# Patient Record
Sex: Female | Born: 1975 | Race: White | Hispanic: No | Smoking: Former smoker
Health system: Southern US, Community
[De-identification: ages and names within clinical notes are randomized; demographics above are authoritative.]

## PROBLEM LIST (undated history)

## (undated) DIAGNOSIS — J45909 Unspecified asthma, uncomplicated: Secondary | ICD-10-CM

## (undated) DIAGNOSIS — Z9289 Personal history of other medical treatment: Secondary | ICD-10-CM

## (undated) DIAGNOSIS — K5720 Diverticulitis of large intestine with perforation and abscess without bleeding: Secondary | ICD-10-CM

## (undated) DIAGNOSIS — F419 Anxiety disorder, unspecified: Secondary | ICD-10-CM

## (undated) DIAGNOSIS — D249 Benign neoplasm of unspecified breast: Secondary | ICD-10-CM

## (undated) DIAGNOSIS — R87810 Cervical high risk human papillomavirus (HPV) DNA test positive: Secondary | ICD-10-CM

## (undated) DIAGNOSIS — K5790 Diverticulosis of intestine, part unspecified, without perforation or abscess without bleeding: Secondary | ICD-10-CM

## (undated) DIAGNOSIS — F329 Major depressive disorder, single episode, unspecified: Secondary | ICD-10-CM

## (undated) DIAGNOSIS — G709 Myoneural disorder, unspecified: Secondary | ICD-10-CM

## (undated) DIAGNOSIS — F32A Depression, unspecified: Secondary | ICD-10-CM

## (undated) DIAGNOSIS — N83209 Unspecified ovarian cyst, unspecified side: Secondary | ICD-10-CM

## (undated) DIAGNOSIS — K819 Cholecystitis, unspecified: Secondary | ICD-10-CM

## (undated) DIAGNOSIS — K219 Gastro-esophageal reflux disease without esophagitis: Secondary | ICD-10-CM

## (undated) DIAGNOSIS — I10 Essential (primary) hypertension: Secondary | ICD-10-CM

## (undated) DIAGNOSIS — N920 Excessive and frequent menstruation with regular cycle: Secondary | ICD-10-CM

## (undated) DIAGNOSIS — K5792 Diverticulitis of intestine, part unspecified, without perforation or abscess without bleeding: Secondary | ICD-10-CM

## (undated) DIAGNOSIS — G4730 Sleep apnea, unspecified: Secondary | ICD-10-CM

## (undated) DIAGNOSIS — G35 Multiple sclerosis: Secondary | ICD-10-CM

## (undated) DIAGNOSIS — G35D Multiple sclerosis, unspecified: Secondary | ICD-10-CM

## (undated) HISTORY — DX: Anxiety disorder, unspecified: F41.9

## (undated) HISTORY — DX: Excessive and frequent menstruation with regular cycle: N92.0

## (undated) HISTORY — DX: Personal history of other medical treatment: Z92.89

## (undated) HISTORY — DX: Cholecystitis, unspecified: K81.9

## (undated) HISTORY — DX: Benign neoplasm of unspecified breast: D24.9

## (undated) HISTORY — DX: Diverticulosis of intestine, part unspecified, without perforation or abscess without bleeding: K57.90

## (undated) HISTORY — DX: Sleep apnea, unspecified: G47.30

## (undated) HISTORY — DX: Unspecified ovarian cyst, unspecified side: N83.209

## (undated) HISTORY — DX: Gastro-esophageal reflux disease without esophagitis: K21.9

## (undated) HISTORY — DX: Myoneural disorder, unspecified: G70.9

## (undated) HISTORY — DX: Depression, unspecified: F32.A

## (undated) HISTORY — DX: Diverticulitis of intestine, part unspecified, without perforation or abscess without bleeding: K57.92

## (undated) HISTORY — DX: Cervical high risk human papillomavirus (HPV) DNA test positive: R87.810

## (undated) HISTORY — PX: EYE SURGERY: SHX253

---
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
## 2004-03-31 DIAGNOSIS — K819 Cholecystitis, unspecified: Secondary | ICD-10-CM

## 2004-03-31 HISTORY — PX: CHOLECYSTECTOMY: SHX55

## 2004-03-31 HISTORY — DX: Cholecystitis, unspecified: K81.9

## 2004-07-26 ENCOUNTER — Ambulatory Visit: Payer: Self-pay | Admitting: Neurology

## 2004-08-16 ENCOUNTER — Ambulatory Visit: Payer: Self-pay | Admitting: Neurology

## 2005-01-10 ENCOUNTER — Emergency Department: Payer: Self-pay | Admitting: Emergency Medicine

## 2005-03-13 ENCOUNTER — Ambulatory Visit: Payer: Self-pay | Admitting: Surgery

## 2008-08-24 DIAGNOSIS — D239 Other benign neoplasm of skin, unspecified: Secondary | ICD-10-CM

## 2008-08-24 HISTORY — DX: Other benign neoplasm of skin, unspecified: D23.9

## 2008-08-29 ENCOUNTER — Ambulatory Visit: Payer: Self-pay

## 2010-01-30 ENCOUNTER — Ambulatory Visit: Payer: Self-pay

## 2010-01-31 ENCOUNTER — Ambulatory Visit: Payer: Self-pay

## 2010-02-01 DIAGNOSIS — N920 Excessive and frequent menstruation with regular cycle: Secondary | ICD-10-CM

## 2010-02-01 HISTORY — PX: HYSTEROSCOPY: SHX211

## 2010-02-01 HISTORY — DX: Excessive and frequent menstruation with regular cycle: N92.0

## 2010-02-01 HISTORY — PX: DILATION AND CURETTAGE, DIAGNOSTIC / THERAPEUTIC: SUR384

## 2010-02-01 LAB — PATHOLOGY REPORT

## 2012-08-30 DIAGNOSIS — G35 Multiple sclerosis: Secondary | ICD-10-CM | POA: Insufficient documentation

## 2013-12-12 ENCOUNTER — Emergency Department (HOSPITAL_COMMUNITY)
Admission: EM | Admit: 2013-12-12 | Discharge: 2013-12-13 | Disposition: A | Discharge status: Home / Self Care | Payer: 59 | Attending: Emergency Medicine | Admitting: Emergency Medicine

## 2013-12-12 ENCOUNTER — Encounter (HOSPITAL_COMMUNITY): Payer: Self-pay | Admitting: Emergency Medicine

## 2013-12-12 DIAGNOSIS — Z9089 Acquired absence of other organs: Secondary | ICD-10-CM | POA: Insufficient documentation

## 2013-12-12 DIAGNOSIS — Z79899 Other long term (current) drug therapy: Secondary | ICD-10-CM | POA: Diagnosis not present

## 2013-12-12 DIAGNOSIS — N83209 Unspecified ovarian cyst, unspecified side: Secondary | ICD-10-CM | POA: Insufficient documentation

## 2013-12-12 DIAGNOSIS — Z3202 Encounter for pregnancy test, result negative: Secondary | ICD-10-CM | POA: Diagnosis not present

## 2013-12-12 DIAGNOSIS — Z87891 Personal history of nicotine dependence: Secondary | ICD-10-CM | POA: Insufficient documentation

## 2013-12-12 DIAGNOSIS — R1032 Left lower quadrant pain: Secondary | ICD-10-CM | POA: Insufficient documentation

## 2013-12-12 DIAGNOSIS — G35 Multiple sclerosis: Secondary | ICD-10-CM | POA: Diagnosis not present

## 2013-12-12 DIAGNOSIS — N83202 Unspecified ovarian cyst, left side: Secondary | ICD-10-CM

## 2013-12-12 HISTORY — DX: Multiple sclerosis, unspecified: G35.D

## 2013-12-12 HISTORY — DX: Multiple sclerosis: G35

## 2013-12-12 LAB — COMPREHENSIVE METABOLIC PANEL
ALT: 14 U/L (ref 0–35)
ANION GAP: 10 (ref 5–15)
AST: 18 U/L (ref 0–37)
Albumin: 3.6 g/dL (ref 3.5–5.2)
Alkaline Phosphatase: 104 U/L (ref 39–117)
BILIRUBIN TOTAL: 0.5 mg/dL (ref 0.3–1.2)
BUN: 10 mg/dL (ref 6–23)
CO2: 28 mEq/L (ref 19–32)
Calcium: 9.1 mg/dL (ref 8.4–10.5)
Chloride: 100 mEq/L (ref 96–112)
Creatinine, Ser: 0.97 mg/dL (ref 0.50–1.10)
GFR calc Af Amer: 84 mL/min — ABNORMAL LOW (ref >90)
GFR calc non Af Amer: 72 mL/min — ABNORMAL LOW (ref >90)
GLUCOSE: 87 mg/dL (ref 70–99)
Potassium: 3.9 mEq/L (ref 3.7–5.3)
Sodium: 138 mEq/L (ref 137–147)
Total Protein: 7.2 g/dL (ref 6.0–8.3)

## 2013-12-12 LAB — CBC WITH DIFFERENTIAL/PLATELET
Basophils Absolute: 0.1 10*3/uL (ref 0.0–0.1)
Basophils Relative: 1 % (ref 0–1)
Eosinophils Absolute: 0.1 10*3/uL (ref 0.0–0.7)
Eosinophils Relative: 1 % (ref 0–5)
HCT: 40.9 % (ref 36.0–46.0)
Hemoglobin: 13.5 g/dL (ref 12.0–15.0)
Lymphocytes Relative: 17 % (ref 12–46)
Lymphs Abs: 2 10*3/uL (ref 0.7–4.0)
MCH: 28.7 pg (ref 26.0–34.0)
MCHC: 33 g/dL (ref 30.0–36.0)
MCV: 86.8 fL (ref 78.0–100.0)
Monocytes Absolute: 1.1 10*3/uL — ABNORMAL HIGH (ref 0.1–1.0)
Monocytes Relative: 9 % (ref 3–12)
NEUTROS ABS: 8.5 10*3/uL — AB (ref 1.7–7.7)
NEUTROS PCT: 72 % (ref 43–77)
Platelets: 305 10*3/uL (ref 150–400)
RBC: 4.71 MIL/uL (ref 3.87–5.11)
RDW: 14.5 % (ref 11.5–15.5)
WBC: 11.8 10*3/uL — ABNORMAL HIGH (ref 4.0–10.5)

## 2013-12-12 LAB — LIPASE, BLOOD: Lipase: 34 U/L (ref 11–59)

## 2013-12-12 NOTE — ED Notes (Signed)
Pt c/o intermittent left lower abdomina pain since Saturday. Pt denies n/v/d, constipation. Last BM was today and normal. No OTC medications taken. Pt denies dysuria, hematuria. Pt has MS

## 2013-12-12 NOTE — ED Provider Notes (Addendum)
CSN: [id number]     Arrival date & time 12/12/13  2050 History   First MD Initiated Contact with Patient 12/12/13 2347     Chief Complaint  Patient presents with  . Abdominal Pain     (Consider location/radiation/quality/duration/timing/severity/associated sxs/prior Treatment) The history is provided by the patient.  Sandra Bonilla is a 38 y.o. female hx of multiple sclerosis here with LLQ pain. Episode of sharp, LLQ pain 2 days ago, lasting about 1 hr. Today around 9pm, had another episode of sharp, LLQ pain. Denies nausea or vomiting. Denies fever or chills. No urinary symptoms. Had previous cholecystectomy.    Past Medical History  Diagnosis Date  . MS (multiple sclerosis)    Past Surgical History  Procedure Laterality Date  . Cholecystectomy    . Dilation and curettage, diagnostic / therapeutic     History reviewed. No pertinent family history. History  Substance Use Topics  . Smoking status: Former Research scientist (life sciences)  . Smokeless tobacco: Never Used  . Alcohol Use: No   OB History   Grav Para Term Preterm Abortions TAB SAB Ect Mult Living                 Review of Systems  Gastrointestinal: Positive for abdominal pain.  All other systems reviewed and are negative.     Allergies  Septra  Home Medications   Prior to Admission medications   Medication Sig Start Date End Date Taking? Authorizing Provider  busPIRone (BUSPAR) 15 MG tablet Take 15 mg by mouth daily.   Yes Historical Provider, MD  Cholecalciferol (VITAMIN D) 2000 UNITS CAPS Take 2,000 Units by mouth daily.   Yes Historical Provider, MD  lamoTRIgine (LAMICTAL) 200 MG tablet Take 200 mg by mouth daily.   Yes Historical Provider, MD  loratadine (CLARITIN) 10 MG tablet Take 10 mg by mouth daily.   Yes Historical Provider, MD  Polyvinyl Alcohol-Povidone (REFRESH OP) Place 2 drops into both eyes 4 (four) times daily as needed (dryness).   Yes Historical Provider, MD  Teriflunomide (AUBAGIO) 14 MG TABS Take 14 mg  by mouth daily.   Yes Historical Provider, MD   BP 128/71  Pulse 95  Temp(Src) 98 F (36.7 C) (Oral)  Resp 16  Ht 5\' 5"  (1.651 m)  Wt 206 lb (93.441 kg)  BMI 34.28 kg/m2  SpO2 98% Physical Exam  Nursing note and vitals reviewed. Constitutional: She is oriented to person, place, and time. She appears well-developed and well-nourished.  HENT:  Head: Normocephalic.  Mouth/Throat: Oropharynx is clear and moist.  Eyes: Conjunctivae and EOM are normal. Pupils are equal, round, and reactive to light.  Neck: Normal range of motion. Neck supple.  Cardiovascular: Normal rate, regular rhythm and normal heart sounds.   Pulmonary/Chest: Effort normal and breath sounds normal. No respiratory distress. She has no wheezes. She has no rales.  Abdominal: Soft. Bowel sounds are normal.  Mild LLQ tenderness. No rebound. No CVAT   Musculoskeletal: Normal range of motion. She exhibits no edema.  Neurological: She is alert and oriented to person, place, and time. No cranial nerve deficit. Coordination normal.  Skin: Skin is warm and dry.  Psychiatric: She has a normal mood and affect. Her behavior is normal. Judgment and thought content normal.    ED Course  Procedures (including critical care time) Labs Review Labs Reviewed  CBC WITH DIFFERENTIAL - Abnormal; Notable for the following:    WBC 11.8 (*)    Neutro Abs 8.5 (*)  Monocytes Absolute 1.1 (*)    All other components within normal limits  COMPREHENSIVE METABOLIC PANEL - Abnormal; Notable for the following:    GFR calc non Af Amer 72 (*)    GFR calc Af Amer 84 (*)    All other components within normal limits  URINALYSIS, ROUTINE W REFLEX MICROSCOPIC  LIPASE, BLOOD  PREGNANCY, URINE    Imaging Review US Transvaginal Non-ob  12/13/2013   CLINICAL DATA:  LEFT adnexal pain, assess for ovarian mass or torsion.  EXAM: TRANSABDOMINAL AND TRANSVAGINAL ULTRASOUND OF PELVIS  DOPPLER ULTRASOUND OF OVARIES  TECHNIQUE: Both transabdominal and  transvaginal ultrasound examinations of the pelvis were performed. Transabdominal technique was performed for global imaging of the pelvis including uterus, ovaries, adnexal regions, and pelvic cul-de-sac.  It was necessary to proceed with endovaginal exam following the transabdominal exam to visualize the endometrium and adenexa. Color and duplex Doppler ultrasound was utilized to evaluate blood flow to the ovaries.  COMPARISON:  CT of the abdomen and pelvis December 13, 2013  FINDINGS: Uterus  Measurements: 7 x 4.1 x 4.4 cm. No fibroids or other mass visualized.  Endometrium  Thickness: 5 mm. No focal abnormality visualized. Linear echogenic intrauterine device with acoustic shadowing appears well seated central within the endometrial cavity.  Right ovary  Measurements: 4.3 x 3 x 3.9 cm. Normal appearance/no adnexal mass.  Left ovary  Measurements: 7.3 x 4.3 x 5.7 cm. 6.8 x 3.9 x 4.5 cm anechoic cyst with increased through transmission.  Pulsed Doppler evaluation of both ovaries demonstrates normal low-resistance arterial and venous waveforms.  Other findings  Trace free fluid in the pelvis is likely physiologic.  IMPRESSION: 6.8 x 3.9 x 4.5 cm LEFT adnexal cyst without sonographic findings of ovarian torsion. Recommend 1 year follow-up (please note, if mass was greater than 7 cm, further imaging such as MRI would be indicated). This recommendation follows the consensus statement: Management of Asymptomatic Ovarian and Other Adnexal Cysts Imaged at [place]: Society of Radiologists in [place]. Radiology 2010; [phone].  IUD in situ.   Electronically Signed   By: Elon Alas   On: 12/13/2013 03:56   US Pelvis Complete  12/13/2013   CLINICAL DATA:  LEFT adnexal pain, assess for ovarian mass or torsion.  EXAM: TRANSABDOMINAL AND TRANSVAGINAL ULTRASOUND OF PELVIS  DOPPLER ULTRASOUND OF OVARIES  TECHNIQUE: Both transabdominal and transvaginal ultrasound examinations of the  pelvis were performed. Transabdominal technique was performed for global imaging of the pelvis including uterus, ovaries, adnexal regions, and pelvic cul-de-sac.  It was necessary to proceed with endovaginal exam following the transabdominal exam to visualize the endometrium and adenexa. Color and duplex Doppler ultrasound was utilized to evaluate blood flow to the ovaries.  COMPARISON:  CT of the abdomen and pelvis December 13, 2013  FINDINGS: Uterus  Measurements: 7 x 4.1 x 4.4 cm. No fibroids or other mass visualized.  Endometrium  Thickness: 5 mm. No focal abnormality visualized. Linear echogenic intrauterine device with acoustic shadowing appears well seated central within the endometrial cavity.  Right ovary  Measurements: 4.3 x 3 x 3.9 cm. Normal appearance/no adnexal mass.  Left ovary  Measurements: 7.3 x 4.3 x 5.7 cm. 6.8 x 3.9 x 4.5 cm anechoic cyst with increased through transmission.  Pulsed Doppler evaluation of both ovaries demonstrates normal low-resistance arterial and venous waveforms.  Other findings  Trace free fluid in the pelvis is likely physiologic.  IMPRESSION: 6.8 x 3.9 x 4.5 cm LEFT adnexal cyst without sonographic  findings of ovarian torsion. Recommend 1 year follow-up (please note, if mass was greater than 7 cm, further imaging such as MRI would be indicated). This recommendation follows the consensus statement: Management of Asymptomatic Ovarian and Other Adnexal Cysts Imaged at [place]: Society of Radiologists in [place]. Radiology 2010; [phone].  IUD in situ.   Electronically Signed   By: Elon Alas   On: 12/13/2013 03:56   [place] Art/ven Flow Abd Pelv Doppler  12/13/2013   CLINICAL DATA:  LEFT adnexal pain, assess for ovarian mass or torsion.  EXAM: TRANSABDOMINAL AND TRANSVAGINAL ULTRASOUND OF PELVIS  DOPPLER ULTRASOUND OF OVARIES  TECHNIQUE: Both transabdominal and transvaginal ultrasound examinations of the pelvis were performed.  Transabdominal technique was performed for global imaging of the pelvis including uterus, ovaries, adnexal regions, and pelvic cul-de-sac.  It was necessary to proceed with endovaginal exam following the transabdominal exam to visualize the endometrium and adenexa. Color and duplex Doppler ultrasound was utilized to evaluate blood flow to the ovaries.  COMPARISON:  CT of the abdomen and pelvis December 13, 2013  FINDINGS: Uterus  Measurements: 7 x 4.1 x 4.4 cm. No fibroids or other mass visualized.  Endometrium  Thickness: 5 mm. No focal abnormality visualized. Linear echogenic intrauterine device with acoustic shadowing appears well seated central within the endometrial cavity.  Right ovary  Measurements: 4.3 x 3 x 3.9 cm. Normal appearance/no adnexal mass.  Left ovary  Measurements: 7.3 x 4.3 x 5.7 cm. 6.8 x 3.9 x 4.5 cm anechoic cyst with increased through transmission.  Pulsed Doppler evaluation of both ovaries demonstrates normal low-resistance arterial and venous waveforms.  Other findings  Trace free fluid in the pelvis is likely physiologic.  IMPRESSION: 6.8 x 3.9 x 4.5 cm LEFT adnexal cyst without sonographic findings of ovarian torsion. Recommend 1 year follow-up (please note, if mass was greater than 7 cm, further imaging such as MRI would be indicated). This recommendation follows the consensus statement: Management of Asymptomatic Ovarian and Other Adnexal Cysts Imaged at [place]: Society of Radiologists in [place]. Radiology 2010; [phone].  IUD in situ.   Electronically Signed   By: Elon Alas   On: 12/13/2013 03:56   Ct Renal Stone Study  12/13/2013   CLINICAL DATA:  Left lower quadrant abdominal pain.  EXAM: CT ABDOMEN AND PELVIS WITHOUT CONTRAST  TECHNIQUE: Multidetector CT imaging of the abdomen and pelvis was performed following the standard protocol without IV contrast.  COMPARISON:  None.  FINDINGS: The visualized lung bases are clear.  The liver and  spleen are unremarkable in appearance. The patient is status post cholecystectomy, with clips noted along the gallbladder fossa. The pancreas and right adrenal gland are unremarkable. A 1.9 cm left adrenal lesion demonstrates attenuation compatible with an adrenal adenoma.  A tiny 2 mm nonobstructing stone is noted in the interpole region of the left kidney. The kidneys are otherwise unremarkable. There is no evidence of hydronephrosis. No obstructing ureteral stones are seen. No perinephric stranding is appreciated.  No free fluid is identified. The small bowel is unremarkable in appearance. The stomach is within normal limits. No acute vascular abnormalities are seen.  The appendix is normal in caliber, without evidence for appendicitis. Scattered diverticulosis is noted along the proximal sigmoid colon, without evidence of diverticulitis.  The bladder is mildly distended and grossly unremarkable. The uterus is unremarkable in appearance. The intrauterine device is noted in expected position at the fundus of the uterus. A prominent 6.6 cm  cystic lesion is noted at the left adnexa. The right ovary is unremarkable in appearance. No inguinal lymphadenopathy is seen.  No acute osseous abnormalities are identified.  IMPRESSION: 1. No acute abnormality seen to explain the patient's symptoms. 2. 6.6 cm large left adnexal cystic lesion. Pelvic ultrasound is recommended for further evaluation, when and as deemed clinically appropriate. 3. Tiny 2 mm nonobstructing left renal stone seen. 4. 1.9 cm left adrenal adenoma incidentally noted. 5. Scattered diverticulosis along the proximal sigmoid colon, without evidence of diverticulitis.   Electronically Signed   By: Garald Balding M.D.   On: 12/13/2013 02:12     EKG Interpretation None      MDM   Final diagnoses:  None    Sandra Bonilla is a 38 y.o. female here with LLQ pain. Likely renal colic, also consider diverticulitis. Less likely ovarian in origin. Will  get CT ab/pel to further assess.   4:21 AM CT showed L ovarian cyst. Also 2 mm nonobstructing stone, which is likely not the source of her pain. US showed no torsion. Patient didn't require pain meds. Has GYN f/u. Will d/c home with prn pain meds.    Wandra Arthurs, MD 12/13/13 Corine Shelter  Wandra Arthurs, MD 12/13/13 [phone]

## 2013-12-13 ENCOUNTER — Emergency Department (HOSPITAL_COMMUNITY): Payer: 59

## 2013-12-13 ENCOUNTER — Encounter (HOSPITAL_COMMUNITY): Payer: Self-pay

## 2013-12-13 LAB — URINALYSIS, ROUTINE W REFLEX MICROSCOPIC
BILIRUBIN URINE: NEGATIVE
Glucose, UA: NEGATIVE mg/dL
Hgb urine dipstick: NEGATIVE
KETONES UR: NEGATIVE mg/dL
Leukocytes, UA: NEGATIVE
NITRITE: NEGATIVE
Protein, ur: NEGATIVE mg/dL
Specific Gravity, Urine: 1.02 (ref 1.005–1.030)
Urobilinogen, UA: 0.2 mg/dL (ref 0.0–1.0)
pH: 6 (ref 5.0–8.0)

## 2013-12-13 LAB — PREGNANCY, URINE: Preg Test, Ur: NEGATIVE

## 2013-12-13 MED ORDER — HYDROCODONE-ACETAMINOPHEN 5-325 MG PO TABS
1.0000 | ORAL_TABLET | Freq: Four times a day (QID) | ORAL | Status: DC | PRN
Start: 2013-12-13 — End: 2016-04-25

## 2013-12-13 NOTE — Discharge Instructions (Signed)
Take motrin for mild pain.   Take vicodin for severe pain. Do NOT drive with it.   You need to see your GYN doctor for follow up.   Return to ER if you have severe pain, vomiting, fevers.

## 2013-12-13 NOTE — ED Notes (Signed)
Patient transported to CT 

## 2013-12-13 NOTE — ED Notes (Signed)
Pt reports LUQ abdominal pain x 3 days. States pain waxes and wanes and increases with movement. Rates pain 1/10 at this time but has been 10/10 earlier today. Denies dysuria, N/V/D. Denies unprotected sex, pregnancy or menses at this time.

## 2014-03-31 DIAGNOSIS — D249 Benign neoplasm of unspecified breast: Secondary | ICD-10-CM

## 2014-03-31 HISTORY — DX: Benign neoplasm of unspecified breast: D24.9

## 2014-03-31 HISTORY — PX: BREAST BIOPSY: SHX20

## 2014-11-22 ENCOUNTER — Other Ambulatory Visit: Payer: Self-pay | Admitting: Unknown Physician Specialty

## 2014-11-22 DIAGNOSIS — R928 Other abnormal and inconclusive findings on diagnostic imaging of breast: Secondary | ICD-10-CM

## 2014-11-28 ENCOUNTER — Ambulatory Visit
Admission: RE | Admit: 2014-11-28 | Discharge: 2014-11-28 | Disposition: A | Discharge status: Home / Self Care | Payer: 59 | Source: Ambulatory Visit | Attending: Unknown Physician Specialty | Admitting: Unknown Physician Specialty

## 2014-11-28 DIAGNOSIS — R928 Other abnormal and inconclusive findings on diagnostic imaging of breast: Secondary | ICD-10-CM | POA: Insufficient documentation

## 2014-11-28 DIAGNOSIS — N63 Unspecified lump in breast: Secondary | ICD-10-CM | POA: Diagnosis not present

## 2014-11-30 DIAGNOSIS — Z9289 Personal history of other medical treatment: Secondary | ICD-10-CM

## 2014-11-30 HISTORY — DX: Personal history of other medical treatment: Z92.89

## 2014-12-05 ENCOUNTER — Other Ambulatory Visit: Payer: Self-pay | Admitting: Unknown Physician Specialty

## 2014-12-05 DIAGNOSIS — N63 Unspecified lump in unspecified breast: Secondary | ICD-10-CM

## 2014-12-05 DIAGNOSIS — R928 Other abnormal and inconclusive findings on diagnostic imaging of breast: Secondary | ICD-10-CM

## 2014-12-08 ENCOUNTER — Ambulatory Visit
Admission: RE | Admit: 2014-12-08 | Discharge: 2014-12-08 | Disposition: A | Discharge status: Home / Self Care | Payer: 59 | Source: Ambulatory Visit | Attending: Unknown Physician Specialty | Admitting: Unknown Physician Specialty

## 2014-12-08 DIAGNOSIS — N63 Unspecified lump in unspecified breast: Secondary | ICD-10-CM

## 2014-12-08 DIAGNOSIS — R928 Other abnormal and inconclusive findings on diagnostic imaging of breast: Secondary | ICD-10-CM

## 2014-12-08 LAB — HM MAMMOGRAPHY

## 2014-12-11 LAB — SURGICAL PATHOLOGY

## 2014-12-13 ENCOUNTER — Ambulatory Visit (INDEPENDENT_AMBULATORY_CARE_PROVIDER_SITE_OTHER): Payer: 59 | Admitting: Neurology

## 2014-12-13 ENCOUNTER — Encounter: Payer: Self-pay | Admitting: Neurology

## 2014-12-13 VITALS — BP 132/90 | HR 70 | Resp 12 | Ht 66.0 in | Wt 201.0 lb

## 2014-12-13 DIAGNOSIS — G4700 Insomnia, unspecified: Secondary | ICD-10-CM

## 2014-12-13 DIAGNOSIS — G35 Multiple sclerosis: Secondary | ICD-10-CM | POA: Diagnosis not present

## 2014-12-13 DIAGNOSIS — R269 Unspecified abnormalities of gait and mobility: Secondary | ICD-10-CM

## 2014-12-13 MED ORDER — DOXEPIN HCL 10 MG PO CAPS: 10.0000 mg | ORAL_CAPSULE | Freq: Every day | ORAL | Status: DC

## 2014-12-13 NOTE — Progress Notes (Signed)
[facility]  PATIENT: Sandra Bonilla DOB: 01/02/1976  REFERRING DOCTOR OR PCP:   Dorisann Frames SOURCE: patient and medical records  _________________________________   HISTORICAL  CHIEF COMPLAINT:  Chief Complaint  Patient presents with  . Multiple Sclerosis    Sts. dx. with MS about 10 yrs. ago.  Presenting sx. was numbness right foot.  She was living in [place], [place] at the time.  She is unable to recall the name of the first neruologist she saw, but believes it was here at [facility].  Sts. dx. confirmed with MRI.  No LP done.  She then saw a 2nd neurologist for a 2nd opinion but can't recall his name.  Sts. he agreed with dx. of MS.  She did not start any MS therapy--about a yr. later she saw a neurologist at [facility] was started on   . Fatigue    Copaxone.  She stopped Copaxone after a yr. due to inj. site rxn's. She then started Aubagio--has been on this for about 2 yrs. now.  Sts. she tolerates Aubagio well.  Sts. last mri was done at [facility] at the beginning of the year and showed no new lesions.  Sts. Dr. Deloria Lair moved to [place] state, so she is here to establish care with Dr. Felecia Shelling.  Sts. currently the sx. that bothers her the most is difficulty staying asleep.  Sts. she goes to sleep ok but wakes after a couple of hrs.  She is   . Insomnia    currently  not taking any rx. or otc sleep aid.  Sts. she has tried Ambien in the past and it helped.  Sts. she does snore.  She does c/o daytime fatigue.  Denies h/a's. /f    HISTORY OF PRESENT ILLNESS:  I had the pleasure of seeing your patient, Sandra Bonilla, at [facility] for neurologic consultation regarding her multiple sclerosis.    About 10 years ago, she had gait changes and felt she could not tell when the rigth foot hit the ground.   She had an MRI consistent with MS and was referred to Dr. Jacolyn Reedy who diagnosed her with MS.   She then saw Dr. Deloria Lair  at [facility]  She was reluctant to take a medication until 3-4 years ago when she started Copaxone.   She had skin reactions, she stopped after one year and switched to Aubagio.   She has tolerated it well.    Her last MRI was performed at [facility] earlier in 2016.   She was told it was unchanged.     Gait/strength/sensation:  She felt a little more off balanced last year.  She feels this improved but she still feels she veers to the right side.   She denies any current numbness or dysesthesia but had a couple episodes of numbness in the past.    .    Her left grip is weaker than her right.  Bladder/Bowel:   She denies any bladder or bowel difficulties.   She has nocturia x 1 some nights.    Fatigue/sleep: She reports some fatigue that is physical as well as cognitive. She does not note much sleepiness. She has had difficulty with her sleep and feels she often will get only 3 hours of sleep during the night. On a typical night, she will fall asleep fairly easily. However, she will wake up several times and have difficulty falling asleep each time. A lot of thoughts go through her mind  when she is awake and she feels she has trouble quieting her mind. She snores. She has not been noted to have pauses in her breathing or gasping for breath at night.  Mood/cognition: She denies depression but has had some anxiety. She is currently on BuSpar 15 mg po tid and lamotrigine 200 mg po bid.  She has not noted major problems with cognition. However, she has noted some difficulty with parallel processing. She works as a Librarian, academic and notes this difficulty more when she is busier. She does not have any major problem with memory or word finding.  REVIEW OF SYSTEMS: Constitutional: No fevers, chills, sweats, or change in appetite   She notes fatigue and poor sleep.    Eyes: No visual changes, double vision, eye pain Ear, nose and throat: No hearing loss, ear pain, nasal congestion, sore throat Cardiovascular: No chest  pain, palpitations Respiratory: No shortness of breath at rest or with exertion.   No wheezes GastrointestinaI: No nausea, vomiting, diarrhea, abdominal pain, fecal incontinence Genitourinary: No dysuria, urinary retention or frequency.  No nocturia. Musculoskeletal: No neck pain, back pain Integumentary: No rash, pruritus, skin lesions Neurological: as above Psychiatric: No depression at this time. Some anxiety Endocrine: No palpitations, diaphoresis, change in appetite, change in weigh or increased thirst Hematologic/Lymphatic: No anemia, purpura, petechiae. Allergic/Immunologic: No itchy/runny eyes, nasal congestion, recent allergic reactions, rashes  ALLERGIES: Allergies  Allergen Reactions  . Septra [Sulfamethoxazole-Trimethoprim] Rash    HOME MEDICATIONS:  Current outpatient prescriptions:  .  busPIRone (BUSPAR) 15 MG tablet, Take 15 mg by mouth daily., Disp: , Rfl:  .  Cholecalciferol (VITAMIN D) 2000 UNITS CAPS, Take 2,000 Units by mouth daily., Disp: , Rfl:  .  lamoTRIgine (LAMICTAL) 200 MG tablet, Take 200 mg by mouth daily., Disp: , Rfl:  .  loratadine (CLARITIN) 10 MG tablet, Take 10 mg by mouth daily., Disp: , Rfl:  .  Teriflunomide (AUBAGIO) 14 MG TABS, Take 14 mg by mouth daily., Disp: , Rfl:  .  HYDROcodone-acetaminophen (NORCO/VICODIN) 5-325 MG per tablet, Take 1 tablet by mouth every 6 (six) hours as needed for moderate pain or severe pain. (Patient not taking: Reported on 12/13/2014), Disp: 10 tablet, Rfl: 0 .  Polyvinyl Alcohol-Povidone (REFRESH OP), Place 2 drops into both eyes 4 (four) times daily as needed (dryness)., Disp: , Rfl:   PAST MEDICAL HISTORY: Past Medical History  Diagnosis Date  . MS (multiple sclerosis)     PAST SURGICAL HISTORY: Past Surgical History  Procedure Laterality Date  . Cholecystectomy    . Dilation and curettage, diagnostic / therapeutic      FAMILY HISTORY: Family History  Problem Relation Age of Onset  . Breast cancer  Maternal Aunt     SOCIAL HISTORY:  Social History   Social History  . Marital Status: Married    Spouse Name: N/A  . Number of Children: N/A  . Years of Education: N/A   Occupational History  . Not on file.   Social History Main Topics  . Smoking status: Former Research scientist (life sciences)  . Smokeless tobacco: Never Used  . Alcohol Use: No  . Drug Use: No  . Sexual Activity: Not on file   Other Topics Concern  . Not on file   Social History Narrative     PHYSICAL EXAM  Filed Vitals:   12/13/14 1008  BP: 132/90  Pulse: 70  Resp: 12  Height: 5\' 6"  (1.676 m)  Weight: 201 lb (91.173 kg)    Body mass  index is 32.46 kg/(m^2).   General: The patient is well-developed and well-nourished and in no acute distress  Eyes:  Funduscopic exam shows normal optic discs and retinal vessels.  Neck: The neck is supple, no carotid bruits are noted.  The neck is nontender.  Cardiovascular: The heart has a regular rate and rhythm with a normal S1 and S2. There were no murmurs, gallops or rubs. Lungs are clear to auscultation.  Skin: Extremities are without significant edema.  Musculoskeletal:  Back is nontender  Neurologic Exam  Mental status: The patient is alert and oriented x 3 at the time of the examination. The patient has apparent normal recent and remote memory, with an apparently normal attention span and concentration ability.   Speech is normal.  Cranial nerves: Extraocular movements are full. Pupils are equal, round, and reactive to light and accomodation.  Visual fields are full.  Facial symmetry is present. There is good facial sensation to soft touch bilaterally.Facial strength is normal.  Trapezius and sternocleidomastoid strength is normal. No dysarthria is noted.  The tongue is midline, and the patient has symmetric elevation of the soft palate. No obvious hearing deficits are noted.  Motor:  Muscle bulk is normal.   Tone is normal. Strength is  5 / 5 in all 4 extremities.    Sensory: Sensory testing is intact to pinprick, soft touch and vibration sensation in all 4 extremities.  Coordination: Cerebellar testing reveals good finger-nose-finger and heel-to-shin bilaterally.  Gait and station: Station is normal.   Gait is normal. Tandem gait is normal. Romberg is negative.   Reflexes: Deep tendon reflexes are symmetric and normal bilaterally.   Plantar responses are flexor.    DIAGNOSTIC DATA (LABS, IMAGING, TESTING) - I reviewed patient records, labs, notes, testing and imaging myself where available.  Lab Results  Component Value Date   WBC 11.8* 12/12/2013   HGB 13.5 12/12/2013   HCT 40.9 12/12/2013   MCV 86.8 12/12/2013   PLT 305 12/12/2013      Component Value Date/Time   NA 138 12/12/2013 2119   K 3.9 12/12/2013 2119   CL 100 12/12/2013 2119   CO2 28 12/12/2013 2119   GLUCOSE 87 12/12/2013 2119   BUN 10 12/12/2013 2119   CREATININE 0.97 12/12/2013 2119   CALCIUM 9.1 12/12/2013 2119   PROT 7.2 12/12/2013 2119   ALBUMIN 3.6 12/12/2013 2119   AST 18 12/12/2013 2119   ALT 14 12/12/2013 2119   ALKPHOS 104 12/12/2013 2119   BILITOT 0.5 12/12/2013 2119   GFRNONAA 72* 12/12/2013 2119   GFRAA 84* 12/12/2013 2119       ASSESSMENT AND PLAN  Multiple sclerosis - Plan: Hepatic function panel, CBC with Differential/Platelet  Gait disturbance  Insomnia  Multiple sclerosis, relapsing-remitting   In summary, Orit Sanville is a 39 year old woman with multiple sclerosis who is doing well on Aubagio therapy. I will check a hepatic function panel and CBC with differential to make sure that there is not hepatotoxicity or lymphopenia.  Currently, she just has a mildly disturbed gait and mild left leg spasticity.  I will get her old MRIs on CD so that we can compare when we check another MRI next year to assess for subclinical progression.  A second problem is insomnia. Her pattern is middle of the night awakenings with difficulty falling back  asleep.   I will add low dose doxepin to see if this is helpful. If she does not receive much benefit, consider addition of nighttime  gabapentin or trazodone.  She will return to see me in 4 months or sooner if there are new or worsening neurologic symptoms.   Richard A. Felecia Shelling, MD, PhD 05/04/5595, 41:63 AM Certified in Neurology, Clinical Neurophysiology, Sleep Medicine, Pain Medicine and Neuroimaging  [facility] [address] [place], [place] [address] [phone]

## 2014-12-14 ENCOUNTER — Telehealth: Payer: Self-pay | Admitting: *Deleted

## 2014-12-14 LAB — CBC WITH DIFFERENTIAL/PLATELET
BASOS: 1 %
Basophils Absolute: 0.1 10*3/uL (ref 0.0–0.2)
EOS (ABSOLUTE): 0.1 10*3/uL (ref 0.0–0.4)
EOS: 1 %
HEMATOCRIT: 40.7 % (ref 34.0–46.6)
HEMOGLOBIN: 12.9 g/dL (ref 11.1–15.9)
Immature Grans (Abs): 0 10*3/uL (ref 0.0–0.1)
Immature Granulocytes: 0 %
LYMPHS ABS: 1.8 10*3/uL (ref 0.7–3.1)
Lymphs: 22 %
MCH: 27.2 pg (ref 26.6–33.0)
MCHC: 31.7 g/dL (ref 31.5–35.7)
MCV: 86 fL (ref 79–97)
MONOCYTES: 11 %
Monocytes Absolute: 0.9 10*3/uL (ref 0.1–0.9)
NEUTROS ABS: 5.2 10*3/uL (ref 1.4–7.0)
Neutrophils: 65 %
Platelets: 328 10*3/uL (ref 150–379)
RBC: 4.74 x10E6/uL (ref 3.77–5.28)
RDW: 15.1 % (ref 12.3–15.4)
WBC: 8.1 10*3/uL (ref 3.4–10.8)

## 2014-12-14 LAB — HEPATIC FUNCTION PANEL
ALBUMIN: 4.1 g/dL (ref 3.5–5.5)
ALT: 18 IU/L (ref 0–32)
AST: 18 IU/L (ref 0–40)
Alkaline Phosphatase: 96 IU/L (ref 39–117)
BILIRUBIN TOTAL: 0.7 mg/dL (ref 0.0–1.2)
BILIRUBIN, DIRECT: 0.32 mg/dL (ref 0.00–0.40)
TOTAL PROTEIN: 6.8 g/dL (ref 6.0–8.5)

## 2014-12-14 NOTE — Telephone Encounter (Signed)
Patient returned your call.

## 2014-12-14 NOTE — Telephone Encounter (Signed)
LMTC./fim 

## 2014-12-14 NOTE — Telephone Encounter (Signed)
-----   Message from Britt Bottom, MD sent at 12/14/2014  8:33 AM EDT ----- Please set her know the blood tests were all fine.

## 2014-12-14 NOTE — Telephone Encounter (Signed)
Faxed release, Dr Felecia Shelling requested records from [facility] received records, went to [facility] 12/14/14.

## 2014-12-15 ENCOUNTER — Telehealth: Payer: Self-pay | Admitting: Neurology

## 2014-12-15 NOTE — Telephone Encounter (Signed)
I informed the patient all lab results were fine.

## 2014-12-15 NOTE — Telephone Encounter (Signed)
Left message with father for Sandra Bonilla to call for results.  No details left/fim

## 2014-12-18 NOTE — Telephone Encounter (Signed)
LMTC./fim 

## 2014-12-18 NOTE — Telephone Encounter (Signed)
-----   Message from Britt Bottom, MD sent at 12/17/2014 12:12 PM EDT ----- Please note that the lab work was normal.

## 2014-12-19 NOTE — Telephone Encounter (Signed)
noted/fim 

## 2014-12-19 NOTE — Telephone Encounter (Signed)
Pt called, relayed labs were normal

## 2014-12-21 ENCOUNTER — Telehealth: Payer: Self-pay | Admitting: *Deleted

## 2014-12-21 NOTE — Telephone Encounter (Signed)
Pt. called back and per instructions, was told by phone room staff that labs were all fine./fim

## 2014-12-21 NOTE — Telephone Encounter (Signed)
-----   Message from Britt Bottom, MD sent at 12/14/2014  8:33 AM EDT ----- Please set her know the blood tests were all fine.

## 2015-03-08 ENCOUNTER — Other Ambulatory Visit: Payer: Self-pay | Admitting: *Deleted

## 2015-03-08 ENCOUNTER — Telehealth: Payer: Self-pay | Admitting: Neurology

## 2015-03-08 MED ORDER — TERIFLUNOMIDE 14 MG PO TABS: 14.0000 mg | ORAL_TABLET | Freq: Every day | ORAL | Status: DC

## 2015-03-08 NOTE — Telephone Encounter (Signed)
First rx. today accidentally printed instead of escribed/fim

## 2015-03-08 NOTE — Telephone Encounter (Signed)
I have spoken with Aliceson--2 boxes of Aubagio samples up front for her to pick up. and rx. sent to [facility].  She verbalized understanding of same/fim

## 2015-03-08 NOTE — Telephone Encounter (Signed)
Pt called requesting refill for Teriflunomide (AUBAGIO) 14 MG TABS . This will go thru [facility] [phone]) Please call pt when it is sent, she out of medication as of today. Her mother passed this week and she has been forgetting to call.

## 2015-04-09 ENCOUNTER — Ambulatory Visit: Payer: 59 | Admitting: Neurology

## 2015-04-11 ENCOUNTER — Ambulatory Visit (INDEPENDENT_AMBULATORY_CARE_PROVIDER_SITE_OTHER): Payer: 59 | Admitting: Neurology

## 2015-04-11 ENCOUNTER — Encounter: Payer: Self-pay | Admitting: Neurology

## 2015-04-11 VITALS — BP 138/90 | HR 78 | Resp 16 | Ht 66.0 in | Wt 218.6 lb

## 2015-04-11 DIAGNOSIS — G4700 Insomnia, unspecified: Secondary | ICD-10-CM | POA: Diagnosis not present

## 2015-04-11 DIAGNOSIS — F418 Other specified anxiety disorders: Secondary | ICD-10-CM

## 2015-04-11 DIAGNOSIS — R208 Other disturbances of skin sensation: Secondary | ICD-10-CM

## 2015-04-11 DIAGNOSIS — G35 Multiple sclerosis: Secondary | ICD-10-CM | POA: Diagnosis not present

## 2015-04-11 DIAGNOSIS — R269 Unspecified abnormalities of gait and mobility: Secondary | ICD-10-CM

## 2015-04-11 MED ORDER — DOXEPIN HCL 25 MG PO CAPS: 25.0000 mg | ORAL_CAPSULE | Freq: Every day | ORAL | Status: DC

## 2015-04-11 NOTE — Progress Notes (Signed)
[facility]  PATIENT: Sandra Bonilla DOB: 05-30-1975  REFERRING DOCTOR OR PCP:   Dorisann Frames SOURCE: patient and medical records  _________________________________   HISTORICAL  CHIEF COMPLAINT:  Chief Complaint  Patient presents with  . Multiple Sclerosis    Sts. she continues to tolerate Aubagio well.  Her mother passed away 2015-03-25, and since then she c/o more trouble with balance--finds herself leaning to the left.  Denies falls.  She also c/o more headaches, more non-radiating lbp, more generalized numbness/tingling.  She wonders if increased sx. are related to stress/fim    HISTORY OF PRESENT ILLNESS:  Sandra Bonilla is a 40 yo woman with realapsing remitting MS.    About 3-4 weeks ago, she noteds gait changed a little bit --- she is veering tpo the left and may hit the wall to the left or a door jamb as walks.   No falls.    She is not bad enough to use a cane.    Of note, symptoms started a few days after her mom died.   She remains on Aubagio x 3+ years and is tolerating it well.   She reports that her last MRI was late 2015 or early 2016.   She states that she was told was unchanged from the previous one.  MS HIstory:       About  2006, she noted gait changes and her right foot was clumsy.    She had an MRI consistent with MS and was referred to Dr. Jacolyn Reedy [facility]) who diagnosed her with MS.   She then saw Dr. Felipe Drone and then Dr. Deloria Lair at [facility].  She was reluctant to take a medication until  2012 years ago when she started Copaxone.   She had skin reactions, she stopped after one year and switched to Aubagio (+/- late 2013).   She has tolerated it well.     Gait/strength/sensation:  She felt a little more off balanced last year.  She feels this improved but she still feels she veers to the right side.   She reports left arm tingling and occasional painful dysesthesia.  Her left grip is weaker than her right.  Bladder/Bowel:   She denies any  bladder or bowel difficulties.   She has nocturia x 1 some nights.    Vision:   She denies any change in vision.   No h/o optic neuritis or dipo[pia.     Fatigue/sleep: She has fatigue that is physical as well as cognitive. She does not note much sleepiness. She has sleep maintenance insomnia and 10 mg doxepin helps a little bit.    Some nights she has a lot of thoughts go through her mind causing mild sleep onset issues.. She snores but no witnessed OSA and no EDS.   Mood/cognition: Her mother died a month ago.   She denies any change in her mild depression or anxiety. She is currently on BuSpar 15 mg po tid and lamotrigine 200 mg po qd.  She has not noted major problems with cognition. Occasionally she has some issue with parallel processing. She works as a Librarian, academic. She does not have any major problem with memory or word finding.  REVIEW OF SYSTEMS: Constitutional: No fevers, chills, sweats, or change in appetite   She notes fatigue and poor sleep.    Eyes: No visual changes, double vision, eye pain Ear, nose and throat: No hearing loss, ear pain, nasal congestion, sore throat Cardiovascular: No chest pain, palpitations Respiratory: No  shortness of breath at rest or with exertion.   No wheezes GastrointestinaI: No nausea, vomiting, diarrhea, abdominal pain, fecal incontinence Genitourinary: No dysuria, urinary retention or frequency.  No nocturia. Musculoskeletal: No neck pain, back pain Integumentary: No rash, pruritus, skin lesions Neurological: as above Psychiatric: No depression at this time. Some anxiety Endocrine: No palpitations, diaphoresis, change in appetite, change in weigh or increased thirst Hematologic/Lymphatic: No anemia, purpura, petechiae. Allergic/Immunologic: No itchy/runny eyes, nasal congestion, recent allergic reactions, rashes  ALLERGIES: Allergies  Allergen Reactions  . Septra [Sulfamethoxazole-Trimethoprim] Rash    HOME MEDICATIONS:  Current  outpatient prescriptions:  .  busPIRone (BUSPAR) 15 MG tablet, Take 15 mg by mouth daily., Disp: , Rfl:  .  Cholecalciferol (VITAMIN D) 2000 UNITS CAPS, Take 2,000 Units by mouth daily., Disp: , Rfl:  .  doxepin (SINEQUAN) 10 MG capsule, Take 1 capsule (10 mg total) by mouth at bedtime., Disp: 30 capsule, Rfl: 11 .  lamoTRIgine (LAMICTAL) 200 MG tablet, Take 200 mg by mouth daily., Disp: , Rfl:  .  loratadine (CLARITIN) 10 MG tablet, Take 10 mg by mouth daily., Disp: , Rfl:  .  Polyvinyl Alcohol-Povidone (REFRESH OP), Place 2 drops into both eyes 4 (four) times daily as needed (dryness)., Disp: , Rfl:  .  Teriflunomide (AUBAGIO) 14 MG TABS, Take 14 mg by mouth daily., Disp: 90 tablet, Rfl: 3 .  HYDROcodone-acetaminophen (NORCO/VICODIN) 5-325 MG per tablet, Take 1 tablet by mouth every 6 (six) hours as needed for moderate pain or severe pain. (Patient not taking: Reported on 9/42/2016), Disp: 10 tablet, Rfl: 0  PAST MEDICAL HISTORY: Past Medical History  Diagnosis Date  . MS (multiple sclerosis) ([place])     PAST SURGICAL HISTORY: Past Surgical History  Procedure Laterality Date  . Cholecystectomy    . Dilation and curettage, diagnostic / therapeutic      FAMILY HISTORY: Family History  Problem Relation Age of Onset  . Breast cancer Maternal Aunt     SOCIAL HISTORY:  Social History   Social History  . Marital Status: Married    Spouse Name: N/A  . Number of Children: N/A  . Years of Education: N/A   Occupational History  . Not on file.   Social History Main Topics  . Smoking status: Former Research scientist (life sciences)  . Smokeless tobacco: Never Used  . Alcohol Use: No  . Drug Use: No  . Sexual Activity: Not on file   Other Topics Concern  . Not on file   Social History Narrative     PHYSICAL EXAM  Filed Vitals:   04/11/15 1116  BP: 138/90  Pulse: 78  Resp: 16  Height: 5\' 6"  (1.676 m)  Weight: 218 lb 9.6 oz (99.156 kg)    Body mass index is 35.3 kg/(m^2).   General: The  patient is well-developed and well-nourished and in no acute distress   Neurologic Exam  Mental status: The patient is alert and oriented x 3 at the time of the examination. The patient has apparent normal recent and remote memory, with an apparently normal attention span and concentration ability.   Speech is normal.  Cranial nerves: Extraocular movements are full.  There is good facial sensation to soft touch bilaterally.Facial strength is normal.  Trapezius and sternocleidomastoid strength is normal. No dysarthria is noted.  The tongue is midline, and the patient has symmetric elevation of the soft palate. No obvious hearing deficits are noted.  Motor:  Muscle bulk is normal.   Tone is normal. Strength  is  5 / 5 in all 4 extremities.   Sensory: Sensory testing is intact to pinprick, soft touch and vibration sensation in all 4 extremities.  Coordination: Cerebellar testing reveals good finger-nose-finger and heel-to-shin bilaterally.  Gait and station: Station is normal.   Gait is mildly wide and she veers slightly to the left. Tandem gait is wide. Romberg is negative.   Reflexes: Deep tendon reflexes are symmetric and normal bilaterally.   Plantar responses are flexor.    DIAGNOSTIC DATA (LABS, IMAGING, TESTING) - I reviewed patient records, labs, notes, testing and imaging myself where available.  Lab Results  Component Value Date   WBC 8.1 12/13/2014   HGB 13.5 12/12/2013   HCT 40.7 12/13/2014   MCV 86 12/13/2014   PLT 328 12/13/2014      Component Value Date/Time   NA 138 12/12/2013 2119   K 3.9 12/12/2013 2119   CL 100 12/12/2013 2119   CO2 28 12/12/2013 2119   GLUCOSE 87 12/12/2013 2119   BUN 10 12/12/2013 2119   CREATININE 0.97 12/12/2013 2119   CALCIUM 9.1 12/12/2013 2119   PROT 6.8 12/13/2014 1051   PROT 7.2 12/12/2013 2119   ALBUMIN 4.1 12/13/2014 1051   ALBUMIN 3.6 12/12/2013 2119   AST 18 12/13/2014 1051   ALT 18 12/13/2014 1051   ALKPHOS 96 12/13/2014  1051   BILITOT 0.7 12/13/2014 1051   BILITOT 0.5 12/12/2013 2119   GFRNONAA 72* 12/12/2013 2119   GFRAA 84* 12/12/2013 2119       ASSESSMENT AND PLAN  Multiple sclerosis, relapsing-remitting (HCC) - Plan: MR Brain W Wo Contrast, MR Cervical Spine W Wo Contrast  Gait disturbance - Plan: MR Brain W Wo Contrast, MR Cervical Spine W Wo Contrast  Insomnia  Dysesthesia  Depression with anxiety    1.    She will continue on Aubagio. Blood work was checked recently so does not have to be performed today. We will check an MRI of the brain and cervical spine to make sure that there has not been subclinical progression, especially in the setting of minimally worsening gait.  I will get her old MRIs on CD so that we can compare 2.   For her insomnia, I'll increase the doxepin 25 mg. If this does not help, we will switch to trazodone.  3.   We discussed that Lamictal that she is on 4 mood can also help dysesthesia and I will recommend we increase the dose if her dysesthetic sensation becomes more painful.  4.   She will return to see me in 6 months or sooner if there are new or worsening neurologic symptoms.   Richard A. Felecia Shelling, MD, PhD [id number], [id number] AM Certified in Neurology, Clinical Neurophysiology, Sleep Medicine, Pain Medicine and Neuroimaging  [facility] [address], [place], [place] [address] [phone]

## 2015-04-12 ENCOUNTER — Telehealth: Payer: Self-pay | Admitting: *Deleted

## 2015-04-12 NOTE — Telephone Encounter (Signed)
Release fax to [facility] requesting MRI Images on CD.

## 2015-04-17 ENCOUNTER — Ambulatory Visit: Payer: 59 | Admitting: Neurology

## 2015-04-18 ENCOUNTER — Ambulatory Visit: Payer: 59 | Admitting: Neurology

## 2015-04-25 ENCOUNTER — Ambulatory Visit (INDEPENDENT_AMBULATORY_CARE_PROVIDER_SITE_OTHER): Payer: 59

## 2015-04-25 DIAGNOSIS — G35 Multiple sclerosis: Secondary | ICD-10-CM | POA: Diagnosis not present

## 2015-04-25 DIAGNOSIS — R269 Unspecified abnormalities of gait and mobility: Secondary | ICD-10-CM

## 2015-04-25 MED ORDER — GADOPENTETATE DIMEGLUMINE 469.01 MG/ML IV SOLN: 20.0000 mL | Freq: Once | INTRAVENOUS | Status: DC | PRN

## 2015-04-30 ENCOUNTER — Telehealth: Payer: Self-pay | Admitting: *Deleted

## 2015-04-30 NOTE — Telephone Encounter (Signed)
-----   Message from Britt Bottom, MD sent at 04/27/2015  4:46 PM EST ----- Please let her know I looked at the MRI and compared it with the ones last year. There has been no change in the brain or cervical spine MRI since then.

## 2015-04-30 NOTE — Telephone Encounter (Signed)
LMTC./fim 

## 2015-04-30 NOTE — Telephone Encounter (Signed)
Pt returned Faith's call °

## 2015-04-30 NOTE — Telephone Encounter (Signed)
I have spoken with Sandra Bonilla and per RAS, advised that he has compared recent mri with the one done last yr., and there have been no new MS changes.  She verbalized understanding of same/fim

## 2015-09-29 DIAGNOSIS — R87810 Cervical high risk human papillomavirus (HPV) DNA test positive: Secondary | ICD-10-CM

## 2015-09-29 HISTORY — DX: Cervical high risk human papillomavirus (HPV) DNA test positive: R87.810

## 2015-10-09 ENCOUNTER — Encounter: Payer: Self-pay | Admitting: Neurology

## 2015-10-09 ENCOUNTER — Ambulatory Visit (INDEPENDENT_AMBULATORY_CARE_PROVIDER_SITE_OTHER): Payer: 59 | Admitting: Neurology

## 2015-10-09 VITALS — BP 128/80 | HR 86 | Resp 14 | Ht 66.0 in | Wt 227.0 lb

## 2015-10-09 DIAGNOSIS — E559 Vitamin D deficiency, unspecified: Secondary | ICD-10-CM

## 2015-10-09 DIAGNOSIS — G4700 Insomnia, unspecified: Secondary | ICD-10-CM

## 2015-10-09 DIAGNOSIS — F418 Other specified anxiety disorders: Secondary | ICD-10-CM | POA: Diagnosis not present

## 2015-10-09 DIAGNOSIS — R5383 Other fatigue: Secondary | ICD-10-CM | POA: Diagnosis not present

## 2015-10-09 DIAGNOSIS — R208 Other disturbances of skin sensation: Secondary | ICD-10-CM | POA: Diagnosis not present

## 2015-10-09 DIAGNOSIS — R269 Unspecified abnormalities of gait and mobility: Secondary | ICD-10-CM

## 2015-10-09 DIAGNOSIS — G35 Multiple sclerosis: Secondary | ICD-10-CM

## 2015-10-09 MED ORDER — TRAZODONE HCL 100 MG PO TABS: ORAL_TABLET | ORAL | Status: DC

## 2015-10-09 NOTE — Progress Notes (Signed)
[facility]  PATIENT: Sandra Bonilla DOB: 01-05-1976  REFERRING DOCTOR OR PCP:   Dorisann Frames SOURCE: patient and medical records  _________________________________   HISTORICAL  CHIEF COMPLAINT:  Chief Complaint  Patient presents with  . Multiple Sclerosis    Sts. she continues to tolerate Aubagio well.  Sts. h/a's have been more frequent, about the same severity.  Sts. balance is still some off--feels like she is leaning more to the right./fim    HISTORY OF PRESENT ILLNESS:  Sandra Bonilla is a 40 yo woman with realapsing remitting MS.    At her last visit, she had had some difficulty with her walking and left arm tingling.    She had an MRI of the brain 04/25/2015 that I personally reviewed. It showed lesions in the white matter consistent with multiple sclerosis. None of them were acute. There is no change when compared to 12/25/2013 MRI.    The gait is slightly better and the tingling is much better. She denies any new exacerbations. She remains on Aubagio x 3+ years and is tolerating it well.   She reports that her last MRI was late 2015 or early 2016.   She states that she was told was unchanged from the previous one.  Gait/strength/sensation:  She Notes a little bit of difficulty with gait and balance.   She feels this improved but she still feels she veers to the right side.   She reports left arm tingling is doing better. Her left grip is is also doing better and is more symmetric to the right grip.  Bladder/Bowel:   She denies any bladder or bowel difficulties.   She has nocturia x 1 some nights.    Vision:   She denies any change in vision.   No h/o optic neuritis or dipo[pia.     Fatigue/sleep: She has fatigue that is physical as well as cognitive. She has more sleepiness --- feels sleepy but does not actually doze off much (i.e rarely with TV or as passenger; she could doze off if she layed down). She has sleep maintenance insomnia. She try 10 mg  doxepin and then increase to 25 mg. This helped a little bit but she continues to wake up in the middle of the night most nights.    Some nights she has a lot of thoughts go through her mind causing mild sleep onset issues.. She snores but no witnessed OSA and notes EDS.   Mood/cognition: She had some depression last visit (mom recently had died).   She feels better.  nxiety is also better    She is currently on BuSpar 15 mg po tid and lamotrigine 200 mg po qd.  She has not noted major problems with cognition. Occasionally she has some issue with parallel processing. She works as a Librarian, academic. She does not have any major problem with memory or word finding.  MS HIstory:       About  2006, she noted gait changes and her right foot was clumsy.    She had an MRI consistent with MS and was referred to Dr. Jacolyn Reedy [facility]) who diagnosed her with MS.   She then saw Dr. Felipe Drone and then Dr. Deloria Lair at [facility].  She was reluctant to take a medication until  2012 years ago when she started Copaxone.   She had skin reactions, she stopped after one year and switched to Aubagio (+/- late 2013).   She has tolerated it well.  REVIEW OF SYSTEMS: Constitutional: No fevers, chills, sweats, or change in appetite   She notes fatigue and poor sleep.    Eyes: No visual changes, double vision, eye pain Ear, nose and throat: No hearing loss, ear pain, nasal congestion, sore throat Cardiovascular: No chest pain, palpitations Respiratory: No shortness of breath at rest or with exertion.   No wheezes.  Some snoring GastrointestinaI: No nausea, vomiting, diarrhea, abdominal pain, fecal incontinence Genitourinary: No dysuria, urinary retention or frequency.  No nocturia. Musculoskeletal: No neck pain, back pain Integumentary: No rash, pruritus, skin lesions Neurological: as above Psychiatric: No depression at this time. Some anxiety Endocrine: No palpitations, diaphoresis, change in appetite, change in weigh or  increased thirst Hematologic/Lymphatic: No anemia, purpura, petechiae. Allergic/Immunologic: No itchy/runny eyes, nasal congestion, recent allergic reactions, rashes  ALLERGIES: Allergies  Allergen Reactions  . Septra [Sulfamethoxazole-Trimethoprim] Rash    HOME MEDICATIONS:  Current outpatient prescriptions:  .  busPIRone (BUSPAR) 15 MG tablet, Take 15 mg by mouth daily., Disp: , Rfl:  .  Cholecalciferol (VITAMIN D) 2000 UNITS CAPS, Take 2,000 Units by mouth daily., Disp: , Rfl:  .  doxepin (SINEQUAN) 25 MG capsule, Take 1 capsule (25 mg total) by mouth at bedtime., Disp: 90 capsule, Rfl: 3 .  HYDROcodone-acetaminophen (NORCO/VICODIN) 5-325 MG per tablet, Take 1 tablet by mouth every 6 (six) hours as needed for moderate pain or severe pain., Disp: 10 tablet, Rfl: 0 .  lamoTRIgine (LAMICTAL) 200 MG tablet, Take 200 mg by mouth daily., Disp: , Rfl:  .  loratadine (CLARITIN) 10 MG tablet, Take 10 mg by mouth daily., Disp: , Rfl:  .  Polyvinyl Alcohol-Povidone (REFRESH OP), Place 2 drops into both eyes 4 (four) times daily as needed (dryness)., Disp: , Rfl:  .  Teriflunomide (AUBAGIO) 14 MG TABS, Take 14 mg by mouth daily., Disp: 90 tablet, Rfl: 3 No current facility-administered medications for this visit.  Facility-Administered Medications Ordered in Other Visits:  .  gadopentetate dimeglumine (MAGNEVIST) injection 20 mL, 20 mL, Intravenous, Once PRN, Britt Bottom, MD  PAST MEDICAL HISTORY: Past Medical History  Diagnosis Date  . MS (multiple sclerosis) ([place])     PAST SURGICAL HISTORY: Past Surgical History  Procedure Laterality Date  . Cholecystectomy    . Dilation and curettage, diagnostic / therapeutic      FAMILY HISTORY: Family History  Problem Relation Age of Onset  . Breast cancer Maternal Aunt     SOCIAL HISTORY:  Social History   Social History  . Marital Status: Married    Spouse Name: N/A  . Number of Children: N/A  . Years of Education: N/A    Occupational History  . Not on file.   Social History Main Topics  . Smoking status: Former Research scientist (life sciences)  . Smokeless tobacco: Never Used  . Alcohol Use: No  . Drug Use: No  . Sexual Activity: Not on file   Other Topics Concern  . Not on file   Social History Narrative     PHYSICAL EXAM  Filed Vitals:   10/09/15 0840  BP: 128/80  Pulse: 86  Resp: 14  Height: 5\' 6"  (1.676 m)  Weight: 227 lb (102.967 kg)    Body mass index is 36.66 kg/(m^2).   General: The patient is well-developed and well-nourished and in no acute distress  Neurologic Exam  Mental status: The patient is alert and oriented x 3 at the time of the examination. The patient has apparent normal recent and remote memory, with an  apparently normal attention span and concentration ability.   Speech is normal.  Cranial nerves: Extraocular movements are full.  There is good facial sensation to soft touch bilaterally.Facial strength is normal.  Trapezius and sternocleidomastoid strength is normal. No dysarthria is noted.  The tongue is midline, and the patient has symmetric elevation of the soft palate. No obvious hearing deficits are noted.  Motor:  Muscle bulk is normal.   Tone is normal. Strength is  5 / 5 in all 4 extremities.   Sensory: Sensory testing is intact to pinprick, soft touch and vibration sensation in all 4 extremities.  Coordination: Cerebellar testing reveals good finger-nose-finger and heel-to-shin bilaterally.  Gait and station: Station is normal.   Gait is normal and tandem is slightly wide.   Romberg is negative.   Reflexes: Deep tendon reflexes are symmetric and normal bilaterally in arms and legs.    No clonus or spread.        DIAGNOSTIC DATA (LABS, IMAGING, TESTING) - I reviewed patient records, labs, notes, testing and imaging myself where available.  Lab Results  Component Value Date   WBC 8.1 12/13/2014   HGB 13.5 12/12/2013   HCT 40.7 12/13/2014   MCV 86 12/13/2014   PLT  328 12/13/2014      Component Value Date/Time   NA 138 12/12/2013 2119   K 3.9 12/12/2013 2119   CL 100 12/12/2013 2119   CO2 28 12/12/2013 2119   GLUCOSE 87 12/12/2013 2119   BUN 10 12/12/2013 2119   CREATININE 0.97 12/12/2013 2119   CALCIUM 9.1 12/12/2013 2119   PROT 6.8 12/13/2014 1051   PROT 7.2 12/12/2013 2119   ALBUMIN 4.1 12/13/2014 1051   ALBUMIN 3.6 12/12/2013 2119   AST 18 12/13/2014 1051   ALT 18 12/13/2014 1051   ALKPHOS 96 12/13/2014 1051   BILITOT 0.7 12/13/2014 1051   BILITOT 0.5 12/12/2013 2119   GFRNONAA 72* 12/12/2013 2119   GFRAA 84* 12/12/2013 2119       ASSESSMENT AND PLAN  Relapsing remitting multiple sclerosis ([place]) - Plan: CBC with Differential/Platelet, Hepatic function panel, VITAMIN D 25 Hydroxy (Vit-D Deficiency, Fractures)  Gait disturbance  Dysesthesia  Insomnia  Vitamin D deficiency - Plan: VITAMIN D 25 Hydroxy (Vit-D Deficiency, Fractures)  Depression with anxiety  Other fatigue    1.    Continue on Aubagio. Check blood work  2.   For her insomnia, I'll switch to trazodone and stop doxepin.   3.   Continue Lamictal for mood and dysesthesia  4.   Take Vit D 4000-5000 U.   I will check Vit D, if lowthen I'll have her take 50000 dose for 8-12 weeks.    5.   Fatigue or sleepiness worsens, consider getting a PSG to better evaluate.  6.   She will return to see me in 6 months or sooner if there are new or worsening neurologic symptoms.   Ruffin Lada A. Felecia Shelling, MD, PhD [id number], [id number] AM Certified in Neurology, Clinical Neurophysiology, Sleep Medicine, Pain Medicine and Neuroimaging  [facility] [address], [place], [place] [address] [phone]

## 2015-10-10 ENCOUNTER — Telehealth: Payer: Self-pay | Admitting: *Deleted

## 2015-10-10 LAB — CBC WITH DIFFERENTIAL/PLATELET
BASOS: 1 %
Basophils Absolute: 0.1 10*3/uL (ref 0.0–0.2)
EOS (ABSOLUTE): 0.1 10*3/uL (ref 0.0–0.4)
EOS: 2 %
HEMATOCRIT: 41.6 % (ref 34.0–46.6)
HEMOGLOBIN: 13.2 g/dL (ref 11.1–15.9)
Immature Grans (Abs): 0 10*3/uL (ref 0.0–0.1)
Immature Granulocytes: 0 %
LYMPHS ABS: 1.9 10*3/uL (ref 0.7–3.1)
Lymphs: 24 %
MCH: 27.4 pg (ref 26.6–33.0)
MCHC: 31.7 g/dL (ref 31.5–35.7)
MCV: 86 fL (ref 79–97)
MONOS ABS: 0.9 10*3/uL (ref 0.1–0.9)
Monocytes: 11 %
NEUTROS ABS: 4.9 10*3/uL (ref 1.4–7.0)
Neutrophils: 62 %
Platelets: 388 10*3/uL — ABNORMAL HIGH (ref 150–379)
RBC: 4.82 x10E6/uL (ref 3.77–5.28)
RDW: 15.3 % (ref 12.3–15.4)
WBC: 7.9 10*3/uL (ref 3.4–10.8)

## 2015-10-10 LAB — HEPATIC FUNCTION PANEL
ALK PHOS: 118 IU/L — AB (ref 39–117)
ALT: 18 IU/L (ref 0–32)
AST: 16 IU/L (ref 0–40)
Albumin: 4.1 g/dL (ref 3.5–5.5)
BILIRUBIN TOTAL: 0.5 mg/dL (ref 0.0–1.2)
Bilirubin, Direct: 0.28 mg/dL (ref 0.00–0.40)
Total Protein: 7 g/dL (ref 6.0–8.5)

## 2015-10-10 LAB — VITAMIN D 25 HYDROXY (VIT D DEFICIENCY, FRACTURES): Vit D, 25-Hydroxy: 40.1 ng/mL (ref 30.0–100.0)

## 2015-10-10 NOTE — Telephone Encounter (Signed)
-----   Message from Britt Bottom, MD sent at 10/10/2015  8:35 AM EDT ----- Please note that the labs are fine. She should still continue to take vitamin D 4000-5000 units OTC.

## 2015-10-10 NOTE — Telephone Encounter (Signed)
LMOM that per RAS, labs look ok.  Vitamin D level is normal, but still low end of normal, so she should continue otc vit. d 4-5,000iu daily.  She does not need to return this call unless she has questions/fim

## 2015-10-15 LAB — HM PAP SMEAR

## 2016-01-17 ENCOUNTER — Other Ambulatory Visit: Payer: Self-pay | Admitting: Obstetrics and Gynecology

## 2016-01-17 DIAGNOSIS — Z1231 Encounter for screening mammogram for malignant neoplasm of breast: Secondary | ICD-10-CM

## 2016-01-21 ENCOUNTER — Telehealth: Payer: Self-pay | Admitting: Neurology

## 2016-01-21 NOTE — Telephone Encounter (Signed)
I have spoken with Sandra Bonilla this morning.  She sts. she has had h/a's in the past--they are some more frequent over the last 2 weeks.  No current h/a.  RAS is ooo this wk.  Appt. with NP offered, but pt. sts. she can wait until appt. with RAS.  Appt. given 02-05-16 and she will call back if she needs to be seen sooner/fim

## 2016-01-21 NOTE — Telephone Encounter (Signed)
Patient is calling . She has been having headaches for a couple of weeks and swelling in her ankles for about a month. She wants to know if she should see Dr. Felecia Shelling for these problems or see her PCP. Please call and advise.

## 2016-01-25 ENCOUNTER — Ambulatory Visit: Payer: 59

## 2016-02-05 ENCOUNTER — Ambulatory Visit (INDEPENDENT_AMBULATORY_CARE_PROVIDER_SITE_OTHER): Payer: 59 | Admitting: Neurology

## 2016-02-05 ENCOUNTER — Encounter: Payer: Self-pay | Admitting: Neurology

## 2016-02-05 VITALS — BP 140/82 | HR 90 | Resp 4 | Ht 66.0 in | Wt 233.0 lb

## 2016-02-05 DIAGNOSIS — F418 Other specified anxiety disorders: Secondary | ICD-10-CM | POA: Diagnosis not present

## 2016-02-05 DIAGNOSIS — G35 Multiple sclerosis: Secondary | ICD-10-CM

## 2016-02-05 DIAGNOSIS — R5383 Other fatigue: Secondary | ICD-10-CM

## 2016-02-05 DIAGNOSIS — G4700 Insomnia, unspecified: Secondary | ICD-10-CM

## 2016-02-05 DIAGNOSIS — R208 Other disturbances of skin sensation: Secondary | ICD-10-CM

## 2016-02-05 DIAGNOSIS — R269 Unspecified abnormalities of gait and mobility: Secondary | ICD-10-CM | POA: Diagnosis not present

## 2016-02-05 DIAGNOSIS — G4489 Other headache syndrome: Secondary | ICD-10-CM | POA: Diagnosis not present

## 2016-02-05 MED ORDER — IMIPRAMINE HCL 25 MG PO TABS: 25.0000 mg | ORAL_TABLET | Freq: Every day | ORAL | 5 refills | Status: DC

## 2016-02-05 NOTE — Progress Notes (Signed)
[facility]  PATIENT: Sandra Bonilla DOB: 1975-10-17  REFERRING DOCTOR OR PCP:   Dorisann Frames SOURCE: patient and medical records  _________________________________   HISTORICAL  CHIEF COMPLAINT:  Chief Complaint  Patient presents with  . Multiple Sclerosis    Sts. she continues to tolerate Aubagio well.  Sts. h/a's have been daily for the last couple of weeks--sts. they are mild h/a's--don't interfere with her day, just uncomfortable.Sts. she is sleeping well Trazodone--she stopped Doxpein as rx''d/fim    HISTORY OF PRESENT ILLNESS:  Sandra Bonilla is a 40 yo woman with relpoapsing remitting MS.    She is on Aubagio tolerates it well. She feels that her MS is stable. She denies any new exacerbations. She had one earlier this year that she feels has resolved.  MRI of the brain 04/25/2015 showed lesions in the white matter consistent with multiple sclerosis and MRI of the cervical spine that day showed multiple lesions in the upper cervical spinal cord but no acute findings.  There was no change when compared to 12/25/2013 MRI.     She remains on Aubagio since 2013 and is tolerating it well.   She brought in some MRIs of the brain and spinal cord from 2006. I compare that with her 2017 MRI. The MRI of the cervical spine looks essentially unchanged. The MRI of the brain shows that there has been some lesions over the ten-year difference though majorityof the lesions were present on both MRIs.            .  Migraines:   She is having more migraines.  They are unilateral but can be either side.   Tylenol helps some.   They are occurring almost every day now and she often has occipital headache pain.   She someimes has blurry vision but no aura.     Gait/strength/sensation:  She has mild ifficulty with gait and balance and sometimes veers to the right.    Left arm tingling is resolved now. Her left grip is is also doing better and is more symmetric to the right  grip.  Bladder/Bowel:   She denies any bladder or bowel difficulties.   She has nocturia x 1 some nights.    Vision:   She notes reduced VA with reading and has been prescribed reading glasses.   She had Lasik in the past.    No h/o optic neuritis or dipo[pia.     Fatigue/sleep: She hasphysical and cognitive fatigue, some days worse than others.  . She has sleep maintenance insomnia.Traxzdone works better than doxepin.   Some nights she has a lot of thoughts go through her mind causing mild sleep onset issues.. She snores but no witnessed OSA and notes EDS.   Mood/cognition: She had some depression last visit (mom recently had died).   She feels better.  nxiety is also better    She is currently on BuSpar 15 mg po tid and lamotrigine 200 mg po qd.  She has not noted major problems with cognition. Occasionally she has some issue with parallel processing. She works as a Librarian, academic. She does not have any major problem with memory or word finding.  MS HIstory:       About  2006, she noted gait changes and her right foot was clumsy.    She had an MRI consistent with MS and was referred to Dr. Jacolyn Reedy [facility]) who diagnosed her with MS.   She then saw Dr. Felipe Drone and then Dr.  Craddock at [facility].  She was reluctant to take a medication until  2012 years ago when she started Copaxone.   She had skin reactions, she stopped after one year and switched to Aubagio (+/- late 2013).   She has tolerated it well.     REVIEW OF SYSTEMS: Constitutional: No fevers, chills, sweats, or change in appetite   She notes fatigue and poor sleep.    Eyes: No visual changes, double vision, eye pain Ear, nose and throat: No hearing loss, ear pain, nasal congestion, sore throat Cardiovascular: No chest pain, palpitations Respiratory: No shortness of breath at rest or with exertion.   No wheezes.  Some snoring GastrointestinaI: No nausea, vomiting, diarrhea, abdominal pain, fecal incontinence Genitourinary: No dysuria,  urinary retention or frequency.  No nocturia. Musculoskeletal: No neck pain, back pain Integumentary: No rash, pruritus, skin lesions Neurological: as above Psychiatric: No depression at this time. Some anxiety Endocrine: No palpitations, diaphoresis, change in appetite, change in weigh or increased thirst Hematologic/Lymphatic: No anemia, purpura, petechiae. Allergic/Immunologic: No itchy/runny eyes, nasal congestion, recent allergic reactions, rashes  ALLERGIES: Allergies  Allergen Reactions  . Septra [Sulfamethoxazole-Trimethoprim] Rash    HOME MEDICATIONS:  Current Outpatient Prescriptions:  .  busPIRone (BUSPAR) 15 MG tablet, Take 15 mg by mouth daily., Disp: , Rfl:  .  Cholecalciferol (VITAMIN D) 2000 UNITS CAPS, Take 2,000 Units by mouth daily., Disp: , Rfl:  .  lamoTRIgine (LAMICTAL) 200 MG tablet, Take 200 mg by mouth daily., Disp: , Rfl:  .  loratadine (CLARITIN) 10 MG tablet, Take 10 mg by mouth daily., Disp: , Rfl:  .  Teriflunomide (AUBAGIO) 14 MG TABS, Take 14 mg by mouth daily., Disp: 90 tablet, Rfl: 3 .  traZODone (DESYREL) 100 MG tablet, Take one half to one pill at bedtime by mouth., Disp: 90 tablet, Rfl: 11 .  doxepin (SINEQUAN) 25 MG capsule, Take 1 capsule (25 mg total) by mouth at bedtime. (Patient not taking: Reported on 02/05/2016), Disp: 90 capsule, Rfl: 3 .  HYDROcodone-acetaminophen (NORCO/VICODIN) 5-325 MG per tablet, Take 1 tablet by mouth every 6 (six) hours as needed for moderate pain or severe pain. (Patient not taking: Reported on 02/05/2016), Disp: 10 tablet, Rfl: 0 .  imipramine (TOFRANIL) 25 MG tablet, Take 1 tablet (25 mg total) by mouth at bedtime., Disp: 30 tablet, Rfl: 5 .  Polyvinyl Alcohol-Povidone (REFRESH OP), Place 2 drops into both eyes 4 (four) times daily as needed (dryness)., Disp: , Rfl:  No current facility-administered medications for this visit.   Facility-Administered Medications Ordered in Other Visits:  .  gadopentetate  dimeglumine (MAGNEVIST) injection 20 mL, 20 mL, Intravenous, Once PRN, Britt Bottom, MD  PAST MEDICAL HISTORY: Past Medical History:  Diagnosis Date  . MS (multiple sclerosis) ([place])     PAST SURGICAL HISTORY: Past Surgical History:  Procedure Laterality Date  . CHOLECYSTECTOMY    . DILATION AND CURETTAGE, DIAGNOSTIC / THERAPEUTIC      FAMILY HISTORY: Family History  Problem Relation Age of Onset  . Breast cancer Maternal Aunt     SOCIAL HISTORY:  Social History   Social History  . Marital status: Married    Spouse name: N/A  . Number of children: N/A  . Years of education: N/A   Occupational History  . Not on file.   Social History Main Topics  . Smoking status: Former Research scientist (life sciences)  . Smokeless tobacco: Never Used  . Alcohol use No  . Drug use: No  . Sexual activity:  Not on file   Other Topics Concern  . Not on file   Social History Narrative  . No narrative on file     PHYSICAL EXAM  Vitals:   02/05/16 1457  BP: 140/82  Pulse: 90  Resp: (!) 4  Weight: 233 lb (105.7 kg)  Height: 5\' 6"  (1.676 m)    Body mass index is 37.61 kg/m.   General: The patient is well-developed and well-nourished and in no acute distress  Neurologic Exam  Mental status: The patient is alert and oriented x 3 at the time of the examination. The patient has apparent normal recent and remote memory, with an apparently normal attention span and concentration ability.   Speech is normal.  Cranial nerves: Extraocular movements are full.  There is good facial sensation to soft touch bilaterally.Facial strength is normal.  Trapezius and sternocleidomastoid strength is normal. No dysarthria is noted.  The tongue is midline, and the patient has symmetric elevation of the soft palate. No obvious hearing deficits are noted.  Motor:  Muscle bulk is normal.   Tone is normal. Strength is  5 / 5 in all 4 extremities.   Sensory: Sensory testing is intact to pinprick, soft touch and  vibration sensation in all 4 extremities.  Coordination: Cerebellar testing reveals good finger-nose-finger and heel-to-shin bilaterally.  Gait and station: Station is normal.   Gait is normal and tandem is slightly wide.   Romberg is negative.   Reflexes: Deep tendon reflexes are symmetric and normal bilaterally in arms and legs.    No clonus or spread.        DIAGNOSTIC DATA (LABS, IMAGING, TESTING) - I reviewed patient records, labs, notes, testing and imaging myself where available.  Lab Results  Component Value Date   WBC 7.9 10/09/2015   HGB 13.5 12/12/2013   HCT 41.6 10/09/2015   MCV 86 10/09/2015   PLT 388 (H) 10/09/2015      Component Value Date/Time   NA 138 12/12/2013 2119   K 3.9 12/12/2013 2119   CL 100 12/12/2013 2119   CO2 28 12/12/2013 2119   GLUCOSE 87 12/12/2013 2119   BUN 10 12/12/2013 2119   CREATININE 0.97 12/12/2013 2119   CALCIUM 9.1 12/12/2013 2119   PROT 7.0 10/09/2015 0924   ALBUMIN 4.1 10/09/2015 0924   AST 16 10/09/2015 0924   ALT 18 10/09/2015 0924   ALKPHOS 118 (H) 10/09/2015 0924   BILITOT 0.5 10/09/2015 0924   GFRNONAA 72 (L) 12/12/2013 2119   GFRAA 84 (L) 12/12/2013 2119       ASSESSMENT AND PLAN  Multiple sclerosis ([place]) - Plan: CBC with Differential/Platelet, Comprehensive metabolic panel  Multiple sclerosis, relapsing-remitting (HCC)  Gait disturbance  Insomnia, unspecified type  Dysesthesia  Depression with anxiety  Other fatigue  Other headache syndrome   1.    Continue Aubagio 14 mg. Check blood work  2.   For her headaches, I'll change the trazodone to imipramine as it may help both HA and insomnia.    3.   Continue Lamictal for mood and dysesthesia  4.   Take Vit D 4000-5000 U.  5.   She will return to see me in 6 months or sooner if there are new or worsening neurologic symptoms.   Richard A. Felecia Shelling, MD, PhD [id number], [id number] PM Certified in Neurology, Clinical Neurophysiology, Sleep Medicine, Pain Medicine  and Neuroimaging  [facility] [address], [place], [place] [address] [phone]

## 2016-02-06 LAB — CBC WITH DIFFERENTIAL/PLATELET
BASOS ABS: 0.1 10*3/uL (ref 0.0–0.2)
Basos: 1 %
EOS (ABSOLUTE): 0.1 10*3/uL (ref 0.0–0.4)
Eos: 1 %
HEMOGLOBIN: 12.8 g/dL (ref 11.1–15.9)
Hematocrit: 40.1 % (ref 34.0–46.6)
Immature Grans (Abs): 0 10*3/uL (ref 0.0–0.1)
Immature Granulocytes: 0 %
LYMPHS ABS: 2.2 10*3/uL (ref 0.7–3.1)
Lymphs: 21 %
MCH: 26.8 pg (ref 26.6–33.0)
MCHC: 31.9 g/dL (ref 31.5–35.7)
MCV: 84 fL (ref 79–97)
MONOCYTES: 8 %
Monocytes Absolute: 0.9 10*3/uL (ref 0.1–0.9)
NEUTROS ABS: 7.4 10*3/uL — AB (ref 1.4–7.0)
Neutrophils: 69 %
Platelets: 379 10*3/uL (ref 150–379)
RBC: 4.77 x10E6/uL (ref 3.77–5.28)
RDW: 14.9 % (ref 12.3–15.4)
WBC: 10.7 10*3/uL (ref 3.4–10.8)

## 2016-02-06 LAB — COMPREHENSIVE METABOLIC PANEL
A/G RATIO: 1.4 (ref 1.2–2.2)
ALBUMIN: 4 g/dL (ref 3.5–5.5)
ALK PHOS: 116 IU/L (ref 39–117)
ALT: 18 IU/L (ref 0–32)
AST: 20 IU/L (ref 0–40)
BILIRUBIN TOTAL: 0.5 mg/dL (ref 0.0–1.2)
BUN / CREAT RATIO: 9 (ref 9–23)
BUN: 9 mg/dL (ref 6–24)
CHLORIDE: 100 mmol/L (ref 96–106)
CO2: 27 mmol/L (ref 18–29)
Calcium: 8.6 mg/dL — ABNORMAL LOW (ref 8.7–10.2)
Creatinine, Ser: 0.97 mg/dL (ref 0.57–1.00)
GFR calc Af Amer: 83 mL/min/{1.73_m2} (ref >59)
GFR calc non Af Amer: 72 mL/min/{1.73_m2} (ref >59)
GLOBULIN, TOTAL: 2.8 g/dL (ref 1.5–4.5)
GLUCOSE: 113 mg/dL — AB (ref 65–99)
POTASSIUM: 4.9 mmol/L (ref 3.5–5.2)
SODIUM: 141 mmol/L (ref 134–144)
Total Protein: 6.8 g/dL (ref 6.0–8.5)

## 2016-02-07 ENCOUNTER — Telehealth: Payer: Self-pay | Admitting: *Deleted

## 2016-02-07 NOTE — Telephone Encounter (Signed)
LMOM (identified vm) that per RAS, labwork done in our office was fine.  She does not need to return this call unless she has questions/fim

## 2016-02-07 NOTE — Telephone Encounter (Signed)
-----   Message from Britt Bottom, MD sent at 02/06/2016  6:01 PM EST ----- Please let her know that the lab work is fine.

## 2016-03-04 ENCOUNTER — Ambulatory Visit: Payer: 59

## 2016-04-03 ENCOUNTER — Ambulatory Visit: Payer: 59

## 2016-04-10 ENCOUNTER — Ambulatory Visit: Payer: 59 | Admitting: Neurology

## 2016-04-20 ENCOUNTER — Encounter (HOSPITAL_COMMUNITY): Payer: Self-pay | Admitting: Oncology

## 2016-04-20 DIAGNOSIS — Z87891 Personal history of nicotine dependence: Secondary | ICD-10-CM

## 2016-04-20 DIAGNOSIS — K5720 Diverticulitis of large intestine with perforation and abscess without bleeding: Secondary | ICD-10-CM | POA: Diagnosis present

## 2016-04-20 DIAGNOSIS — A419 Sepsis, unspecified organism: Principal | ICD-10-CM | POA: Diagnosis present

## 2016-04-20 DIAGNOSIS — Z888 Allergy status to other drugs, medicaments and biological substances status: Secondary | ICD-10-CM

## 2016-04-20 DIAGNOSIS — Z87442 Personal history of urinary calculi: Secondary | ICD-10-CM

## 2016-04-20 DIAGNOSIS — Z79899 Other long term (current) drug therapy: Secondary | ICD-10-CM

## 2016-04-20 DIAGNOSIS — F419 Anxiety disorder, unspecified: Secondary | ICD-10-CM | POA: Diagnosis present

## 2016-04-20 DIAGNOSIS — Z9049 Acquired absence of other specified parts of digestive tract: Secondary | ICD-10-CM

## 2016-04-20 DIAGNOSIS — I10 Essential (primary) hypertension: Secondary | ICD-10-CM | POA: Diagnosis present

## 2016-04-20 DIAGNOSIS — G35 Multiple sclerosis: Secondary | ICD-10-CM | POA: Diagnosis present

## 2016-04-20 DIAGNOSIS — Z881 Allergy status to other antibiotic agents status: Secondary | ICD-10-CM

## 2016-04-20 DIAGNOSIS — E559 Vitamin D deficiency, unspecified: Secondary | ICD-10-CM | POA: Diagnosis present

## 2016-04-20 DIAGNOSIS — Z803 Family history of malignant neoplasm of breast: Secondary | ICD-10-CM

## 2016-04-20 DIAGNOSIS — Z79891 Long term (current) use of opiate analgesic: Secondary | ICD-10-CM

## 2016-04-20 DIAGNOSIS — F329 Major depressive disorder, single episode, unspecified: Secondary | ICD-10-CM | POA: Diagnosis present

## 2016-04-20 DIAGNOSIS — R1032 Left lower quadrant pain: Secondary | ICD-10-CM | POA: Diagnosis not present

## 2016-04-20 LAB — CBC
HCT: 42.1 % (ref 36.0–46.0)
HEMOGLOBIN: 13.9 g/dL (ref 12.0–15.0)
MCH: 27.5 pg (ref 26.0–34.0)
MCHC: 33 g/dL (ref 30.0–36.0)
MCV: 83.4 fL (ref 78.0–100.0)
PLATELETS: 384 10*3/uL (ref 150–400)
RBC: 5.05 MIL/uL (ref 3.87–5.11)
RDW: 14.6 % (ref 11.5–15.5)
WBC: 16 10*3/uL — AB (ref 4.0–10.5)

## 2016-04-20 LAB — COMPREHENSIVE METABOLIC PANEL
ALK PHOS: 116 U/L (ref 38–126)
ALT: 25 U/L (ref 14–54)
ANION GAP: 9 (ref 5–15)
AST: 26 U/L (ref 15–41)
Albumin: 4.5 g/dL (ref 3.5–5.0)
BUN: 22 mg/dL — ABNORMAL HIGH (ref 6–20)
CALCIUM: 9.1 mg/dL (ref 8.9–10.3)
CO2: 29 mmol/L (ref 22–32)
CREATININE: 1.09 mg/dL — AB (ref 0.44–1.00)
Chloride: 99 mmol/L — ABNORMAL LOW (ref 101–111)
Glucose, Bld: 133 mg/dL — ABNORMAL HIGH (ref 65–99)
Potassium: 3.4 mmol/L — ABNORMAL LOW (ref 3.5–5.1)
Sodium: 137 mmol/L (ref 135–145)
TOTAL PROTEIN: 7.7 g/dL (ref 6.5–8.1)
Total Bilirubin: 0.7 mg/dL (ref 0.3–1.2)

## 2016-04-20 LAB — LIPASE, BLOOD: Lipase: 29 U/L (ref 11–51)

## 2016-04-20 NOTE — ED Triage Notes (Signed)
Pt presents d/t lower abdominal pain and burning w/ urination x 2 hours.  Pt is A&O x 4.  Rates pain 10/10.  Denies N/V/D

## 2016-04-21 ENCOUNTER — Encounter (HOSPITAL_COMMUNITY): Payer: Self-pay | Admitting: Radiology

## 2016-04-21 ENCOUNTER — Emergency Department (HOSPITAL_COMMUNITY): Payer: 59

## 2016-04-21 ENCOUNTER — Inpatient Hospital Stay (HOSPITAL_COMMUNITY)
Admission: EM | Admit: 2016-04-21 | Discharge: 2016-04-25 | DRG: 872 | Disposition: A | Discharge status: Home / Self Care | Payer: 59 | Attending: Family Medicine | Admitting: Family Medicine

## 2016-04-21 DIAGNOSIS — Z87891 Personal history of nicotine dependence: Secondary | ICD-10-CM | POA: Diagnosis not present

## 2016-04-21 DIAGNOSIS — Z888 Allergy status to other drugs, medicaments and biological substances status: Secondary | ICD-10-CM | POA: Diagnosis not present

## 2016-04-21 DIAGNOSIS — Z79899 Other long term (current) drug therapy: Secondary | ICD-10-CM | POA: Diagnosis not present

## 2016-04-21 DIAGNOSIS — R1032 Left lower quadrant pain: Secondary | ICD-10-CM | POA: Diagnosis present

## 2016-04-21 DIAGNOSIS — F329 Major depressive disorder, single episode, unspecified: Secondary | ICD-10-CM | POA: Diagnosis present

## 2016-04-21 DIAGNOSIS — K5720 Diverticulitis of large intestine with perforation and abscess without bleeding: Secondary | ICD-10-CM | POA: Diagnosis present

## 2016-04-21 DIAGNOSIS — K5732 Diverticulitis of large intestine without perforation or abscess without bleeding: Secondary | ICD-10-CM | POA: Diagnosis not present

## 2016-04-21 DIAGNOSIS — Z79891 Long term (current) use of opiate analgesic: Secondary | ICD-10-CM | POA: Diagnosis not present

## 2016-04-21 DIAGNOSIS — I10 Essential (primary) hypertension: Secondary | ICD-10-CM | POA: Diagnosis present

## 2016-04-21 DIAGNOSIS — Z803 Family history of malignant neoplasm of breast: Secondary | ICD-10-CM | POA: Diagnosis not present

## 2016-04-21 DIAGNOSIS — Z87442 Personal history of urinary calculi: Secondary | ICD-10-CM | POA: Diagnosis not present

## 2016-04-21 DIAGNOSIS — K5792 Diverticulitis of intestine, part unspecified, without perforation or abscess without bleeding: Secondary | ICD-10-CM | POA: Diagnosis present

## 2016-04-21 DIAGNOSIS — F419 Anxiety disorder, unspecified: Secondary | ICD-10-CM | POA: Diagnosis present

## 2016-04-21 DIAGNOSIS — E559 Vitamin D deficiency, unspecified: Secondary | ICD-10-CM | POA: Diagnosis present

## 2016-04-21 DIAGNOSIS — Z9049 Acquired absence of other specified parts of digestive tract: Secondary | ICD-10-CM | POA: Diagnosis not present

## 2016-04-21 DIAGNOSIS — A419 Sepsis, unspecified organism: Secondary | ICD-10-CM | POA: Diagnosis present

## 2016-04-21 DIAGNOSIS — K631 Perforation of intestine (nontraumatic): Secondary | ICD-10-CM | POA: Diagnosis not present

## 2016-04-21 DIAGNOSIS — G35 Multiple sclerosis: Secondary | ICD-10-CM

## 2016-04-21 DIAGNOSIS — Z881 Allergy status to other antibiotic agents status: Secondary | ICD-10-CM | POA: Diagnosis not present

## 2016-04-21 HISTORY — DX: Essential (primary) hypertension: I10

## 2016-04-21 LAB — PREGNANCY, URINE: Preg Test, Ur: NEGATIVE

## 2016-04-21 LAB — CBC
HEMATOCRIT: 40.5 % (ref 36.0–46.0)
Hemoglobin: 13.3 g/dL (ref 12.0–15.0)
MCH: 27.2 pg (ref 26.0–34.0)
MCHC: 32.8 g/dL (ref 30.0–36.0)
MCV: 82.8 fL (ref 78.0–100.0)
PLATELETS: 345 10*3/uL (ref 150–400)
RBC: 4.89 MIL/uL (ref 3.87–5.11)
RDW: 14.8 % (ref 11.5–15.5)
WBC: 20.2 10*3/uL — AB (ref 4.0–10.5)

## 2016-04-21 LAB — URINALYSIS, ROUTINE W REFLEX MICROSCOPIC
Bilirubin Urine: NEGATIVE
GLUCOSE, UA: NEGATIVE mg/dL
Hgb urine dipstick: NEGATIVE
KETONES UR: 5 mg/dL — AB
LEUKOCYTES UA: NEGATIVE
NITRITE: NEGATIVE
PH: 5 (ref 5.0–8.0)
Protein, ur: NEGATIVE mg/dL
SPECIFIC GRAVITY, URINE: 1.029 (ref 1.005–1.030)

## 2016-04-21 LAB — CREATININE, SERUM: CREATININE: 0.96 mg/dL (ref 0.44–1.00)

## 2016-04-21 MED ORDER — ONDANSETRON HCL 4 MG PO TABS: 4.0000 mg | ORAL_TABLET | Freq: Four times a day (QID) | ORAL | Status: DC | PRN

## 2016-04-21 MED ORDER — CIPROFLOXACIN IN D5W 400 MG/200ML IV SOLN: 400.0000 mg | Freq: Once | INTRAVENOUS | Status: DC

## 2016-04-21 MED ORDER — ACETAMINOPHEN 325 MG PO TABS
650.0000 mg | ORAL_TABLET | Freq: Four times a day (QID) | ORAL | Status: DC | PRN
  Administered 2016-04-22 – 2016-04-23 (×3): 650 mg via ORAL
  Filled 2016-04-21 (×4): qty 2

## 2016-04-21 MED ORDER — PIPERACILLIN-TAZOBACTAM 3.375 G IVPB
3.3750 g | Freq: Once | INTRAVENOUS | Status: AC
  Administered 2016-04-21: 3.375 g via INTRAVENOUS
  Filled 2016-04-21: qty 50

## 2016-04-21 MED ORDER — LAMOTRIGINE 100 MG PO TABS
400.0000 mg | ORAL_TABLET | Freq: Every day | ORAL | Status: DC
  Administered 2016-04-21 – 2016-04-25 (×5): 400 mg via ORAL
  Filled 2016-04-21 (×6): qty 4

## 2016-04-21 MED ORDER — DEXTROSE-NACL 5-0.9 % IV SOLN
INTRAVENOUS | Status: DC
  Administered 2016-04-21: 11:00:00 via INTRAVENOUS

## 2016-04-21 MED ORDER — TERIFLUNOMIDE 14 MG PO TABS
14.0000 mg | ORAL_TABLET | Freq: Every day | ORAL | Status: DC
  Administered 2016-04-22 – 2016-04-25 (×4): 14 mg via ORAL

## 2016-04-21 MED ORDER — MORPHINE SULFATE (PF) 2 MG/ML IV SOLN
1.0000 mg | INTRAVENOUS | Status: DC | PRN
  Administered 2016-04-22: 1 mg via INTRAVENOUS
  Filled 2016-04-21: qty 1

## 2016-04-21 MED ORDER — PIPERACILLIN-TAZOBACTAM 3.375 G IVPB
3.3750 g | Freq: Three times a day (TID) | INTRAVENOUS | Status: DC
  Administered 2016-04-21 – 2016-04-24 (×9): 3.375 g via INTRAVENOUS
  Filled 2016-04-21 (×9): qty 50

## 2016-04-21 MED ORDER — ACETAMINOPHEN 650 MG RE SUPP: 650.0000 mg | Freq: Four times a day (QID) | RECTAL | Status: DC | PRN

## 2016-04-21 MED ORDER — POLYVINYL ALCOHOL 1.4 % OP SOLN
1.0000 [drp] | Freq: Four times a day (QID) | OPHTHALMIC | Status: DC | PRN
  Administered 2016-04-23: 1 [drp] via OPHTHALMIC
  Filled 2016-04-21: qty 15

## 2016-04-21 MED ORDER — LORATADINE 10 MG PO TABS
10.0000 mg | ORAL_TABLET | Freq: Every day | ORAL | Status: DC
Start: 2016-04-21 — End: 2016-04-25
  Administered 2016-04-21 – 2016-04-25 (×5): 10 mg via ORAL
  Filled 2016-04-21 (×5): qty 1

## 2016-04-21 MED ORDER — PANTOPRAZOLE SODIUM 40 MG PO TBEC
40.0000 mg | DELAYED_RELEASE_TABLET | Freq: Every day | ORAL | Status: DC
  Administered 2016-04-21 – 2016-04-25 (×5): 40 mg via ORAL
  Filled 2016-04-21 (×5): qty 1

## 2016-04-21 MED ORDER — BUSPIRONE HCL 5 MG PO TABS
15.0000 mg | ORAL_TABLET | Freq: Three times a day (TID) | ORAL | Status: DC
  Administered 2016-04-21 – 2016-04-25 (×12): 15 mg via ORAL
  Filled 2016-04-21 (×14): qty 3

## 2016-04-21 MED ORDER — ONDANSETRON HCL 4 MG/2ML IJ SOLN: 4.0000 mg | Freq: Four times a day (QID) | INTRAMUSCULAR | Status: DC | PRN

## 2016-04-21 MED ORDER — OXYCODONE-ACETAMINOPHEN 5-325 MG PO TABS
1.0000 | ORAL_TABLET | Freq: Once | ORAL | Status: AC
  Administered 2016-04-21: 1 via ORAL
  Filled 2016-04-21: qty 1

## 2016-04-21 MED ORDER — METRONIDAZOLE IN NACL 5-0.79 MG/ML-% IV SOLN: 500.0000 mg | Freq: Once | INTRAVENOUS | Status: DC

## 2016-04-21 MED ORDER — ENOXAPARIN SODIUM 40 MG/0.4ML ~~LOC~~ SOLN
40.0000 mg | SUBCUTANEOUS | Status: DC
  Administered 2016-04-21 – 2016-04-24 (×4): 40 mg via SUBCUTANEOUS
  Filled 2016-04-21 (×4): qty 0.4

## 2016-04-21 NOTE — ED Notes (Signed)
Patient transported to CT 

## 2016-04-21 NOTE — ED Notes (Signed)
Spoke with EDP. Blood cultures are not needed at this time.

## 2016-04-21 NOTE — Progress Notes (Signed)
Pharmacy Antibiotic Note  Sandra Bonilla is a 41 y.o. female admitted on 04/21/2016 with sigmoid diverticulitis with microperforation.  Pharmacy has been consulted for Zosyn dosing.  Plan:  Zosyn 3.375gm IV q8h (4hr extended infusions)  No further dosing adjustments needed, Pharmacy will sign off    Temp (24hrs), Avg:99.4 F (37.4 C), Min:97.7 F (36.5 C), Max:100.2 F (37.9 C)   Recent Labs Lab 04/20/16 2142  WBC 16.0*  CREATININE 1.09*    CrCl cannot be calculated (Unknown ideal weight.).    Allergies  Allergen Reactions  . Septra [Sulfamethoxazole-Trimethoprim] Rash    Antimicrobials this admission: 1/22 Zosyn >>  Dose adjustments this admission: ---  Microbiology results: None  Thank you for allowing pharmacy to be a part of this patient's care.  Peggyann Juba, PharmD, BCPS Pager: [phone] 04/21/2016 10:52 AM

## 2016-04-21 NOTE — ED Notes (Signed)
ED Provider at bedside. 

## 2016-04-21 NOTE — ED Provider Notes (Signed)
Medical screening examination/treatment/procedure(s) were conducted as a shared visit with non-physician practitioner(s) and myself.  I personally evaluated the patient during the encounter.  PERRL RRR CTA NABS, abdomen is soft   FROM.     Veatrice Kells, MD 04/21/16 [phone]

## 2016-04-21 NOTE — Consult Note (Signed)
Reason for Consult: diverticulitis with microperforation Referring Physician: April Palumbo PA-C  Sandra Bonilla is an 41 y.o. female.    HPI: Pt presents with abdominal pain and elevated WBC.  She has a history of ovarian cyst, Multiple sclerosis and hypertension. She reports onset of pain acutely last night at 7 PM. She's never had this issue before. Pain persisted. She's had no nausea, vomiting, diarrhea, or fever at home. She did develop a low-grade fever here. Her pain has been primarily on the left side, but it has migrated some to the right side.  Work up in the Ed shows: low grade fever, tachycardia, WBC 16,000.  Mild creatinine elevation and CT scan showing:  1.  Acute inflammatory process in the left pelvis abutting the sigmoid colon and left adnexal region. Based on the presence of diverticulae of the sigmoid colon as well as some small foci of extra luminal air, acute diverticulitis is favored. No associated focal abscess or bowel obstruction. Tubo-ovarian abscess on the left is felt to be less likely. 2. Tiny nonobstructing left renal calculus. 3. Stable 2 cm left adrenal gland benign and adenoma.  We are ask to see.  Past Medical History:  Diagnosis Date  . Hypertension    Hx of tobacco use - quit 7 years ago - 1/2 PPD x 2o years   . MS (multiple sclerosis) ([place])     Past Surgical History:  Procedure Laterality Date  . CHOLECYSTECTOMY    . DILATION AND CURETTAGE, DIAGNOSTIC / THERAPEUTIC      Family History  Problem Relation Age of Onset  . Breast cancer Maternal Aunt     Social History:  reports that she has quit smoking. She has never used smokeless tobacco. She reports that she does not drink alcohol or use drugs. Tobacco: Half pack a day 20 years quit 7 years ago. Drugs: None EtOH: None Divorced lives with family lash works for [employer]:  Allergies  Allergen Reactions  . Septra [Sulfamethoxazole-Trimethoprim] Rash    Prior to  Admission medications   Medication Sig Start Date End Date Taking? Authorizing Provider  busPIRone (BUSPAR) 15 MG tablet Take 15 mg by mouth 3 (three) times daily.    Yes Historical Provider, MD  chlorthalidone (HYGROTON) 25 MG tablet Take 12.5 mg by mouth daily.   Yes Historical Provider, MD  lamoTRIgine (LAMICTAL) 200 MG tablet Take 400 mg by mouth daily.    Yes Historical Provider, MD  loratadine (CLARITIN) 10 MG tablet Take 10 mg by mouth daily.   Yes Historical Provider, MD  Polyvinyl Alcohol-Povidone (REFRESH OP) Place 2 drops into both eyes 4 (four) times daily as needed (dryness).   Yes Historical Provider, MD  Teriflunomide (AUBAGIO) 14 MG TABS Take 14 mg by mouth daily. 03/08/15  Yes Britt Bottom, MD  doxepin (SINEQUAN) 25 MG capsule Take 1 capsule (25 mg total) by mouth at bedtime. Patient not taking: Reported on 02/05/2016 04/11/15 Not taking  Britt Bottom, MD  HYDROcodone-acetaminophen (NORCO/VICODIN) 5-325 MG per tablet Take 1 tablet by mouth every 6 (six) hours as needed for moderate pain or severe pain. Patient not taking: Reported on 02/05/2016 12/13/13 Not taking  Drenda Freeze, MD  imipramine (TOFRANIL) 25 MG tablet Take 1 tablet (25 mg total) by mouth at bedtime. Patient not taking: Reported on 04/21/2016 02/05/16 Not taking  Britt Bottom, MD  traZODone (DESYREL) 100 MG tablet Take one half to one pill at bedtime by mouth. Patient  not taking: Reported on 04/21/2016 10/09/15 Not taking  Britt Bottom, MD     Results for orders placed or performed during the hospital encounter of 04/21/16 (from the past 48 hour(s))  Lipase, blood     Status: None   Collection Time: 04/20/16  9:42 PM  Result Value Ref Range   Lipase 29 11 - 51 U/L  Comprehensive metabolic panel     Status: Abnormal   Collection Time: 04/20/16  9:42 PM  Result Value Ref Range   Sodium 137 135 - 145 mmol/L   Potassium 3.4 (L) 3.5 - 5.1 mmol/L   Chloride 99 (L) 101 - 111 mmol/L   CO2 29 22 - 32  mmol/L   Glucose, Bld 133 (H) 65 - 99 mg/dL   BUN 22 (H) 6 - 20 mg/dL   Creatinine, Ser 1.09 (H) 0.44 - 1.00 mg/dL   Calcium 9.1 8.9 - 10.3 mg/dL   Total Protein 7.7 6.5 - 8.1 g/dL   Albumin 4.5 3.5 - 5.0 g/dL   AST 26 15 - 41 U/L   ALT 25 14 - 54 U/L   Alkaline Phosphatase 116 38 - 126 U/L   Total Bilirubin 0.7 0.3 - 1.2 mg/dL   GFR calc non Af Amer >60 >60 mL/min   GFR calc Af Amer >60 >60 mL/min    Comment: (NOTE) The eGFR has been calculated using the CKD EPI equation. This calculation has not been validated in all clinical situations. eGFR's persistently <60 mL/min signify possible Chronic Kidney Disease.    Anion gap 9 5 - 15  CBC     Status: Abnormal   Collection Time: 04/20/16  9:42 PM  Result Value Ref Range   WBC 16.0 (H) 4.0 - 10.5 K/uL   RBC 5.05 3.87 - 5.11 MIL/uL   Hemoglobin 13.9 12.0 - 15.0 g/dL   HCT 42.1 36.0 - 46.0 %   MCV 83.4 78.0 - 100.0 fL   MCH 27.5 26.0 - 34.0 pg   MCHC 33.0 30.0 - 36.0 g/dL   RDW 14.6 11.5 - 15.5 %   Platelets 384 150 - 400 K/uL  Urinalysis, Routine w reflex microscopic     Status: Abnormal   Collection Time: 04/21/16  4:25 AM  Result Value Ref Range   Color, Urine YELLOW YELLOW   APPearance CLEAR CLEAR   Specific Gravity, Urine 1.029 1.005 - 1.030   pH 5.0 5.0 - 8.0   Glucose, UA NEGATIVE NEGATIVE mg/dL   Hgb urine dipstick NEGATIVE NEGATIVE   Bilirubin Urine NEGATIVE NEGATIVE   Ketones, ur 5 (A) NEGATIVE mg/dL   Protein, ur NEGATIVE NEGATIVE mg/dL   Nitrite NEGATIVE NEGATIVE   Leukocytes, UA NEGATIVE NEGATIVE  Pregnancy, urine     Status: None   Collection Time: 04/21/16  4:25 AM  Result Value Ref Range   Preg Test, Ur NEGATIVE NEGATIVE    Comment:        THE SENSITIVITY OF THIS METHODOLOGY IS >20 mIU/mL.     Ct Renal Stone Study  Result Date: 04/21/2016 CLINICAL DATA:  Lower abdominal pain and dysuria. History of multiple sclerosis. EXAM: CT ABDOMEN AND PELVIS WITHOUT CONTRAST TECHNIQUE: Multidetector CT imaging  of the abdomen and pelvis was performed following the standard protocol without IV contrast. COMPARISON:  12/13/2013 FINDINGS: Lower chest: No acute abnormality. Hepatobiliary: No focal liver abnormality is seen. Status post cholecystectomy. No biliary dilatation. Pancreas: Unremarkable. No pancreatic ductal dilatation or surrounding inflammatory changes. Spleen: Normal in size without focal  abnormality. Adrenals/Urinary Tract: Stable 2 cm low-density adenoma of the left adrenal gland. The right adrenal gland is normal. Both kidneys show no hydronephrosis. There is a tiny nonobstructing calculus in the mid left kidney. No ureteral or bladder abnormalities. Stomach/Bowel: There is an acute inflammatory process in the left pelvis abutting the sigmoid colon and left adnexal region. Based on the presence of diverticula of the sigmoid colon as well as a few foci of extraluminal air in this region, acute diverticulitis is favored. There does appear to be secondary inflammation of the left ovary/ adnexal region. No focal abscess is identified. No significant free intraperitoneal air. Other bowel is unremarkable. The appendix is normal. Vascular/Lymphatic: No enlarged lymph nodes are identified. The abdominal aorta is of normal caliber. No calcified atherosclerotic plaque identified. Reproductive: As above, there is secondary inflammation of the left ovary, likely secondary to acute diverticulitis. Tubo-ovarian abscess is felt less likely. Other: No hernias identified. Musculoskeletal: No acute or significant osseous findings. IMPRESSION: 1. Acute inflammatory process in the left pelvis abutting the sigmoid colon and left adnexal region. Based on the presence of diverticulae of the sigmoid colon as well as some small foci of extra luminal air, acute diverticulitis is favored. No associated focal abscess or bowel obstruction. Tubo-ovarian abscess on the left is felt to be less likely. 2. Tiny nonobstructing left renal  calculus. 3. Stable 2 cm left adrenal gland benign and adenoma. Electronically Signed   By: Aletta Edouard M.D.   On: 04/21/2016 08:10    Review of Systems  Constitutional: Positive for fever (here in the ED). Negative for diaphoresis, malaise/fatigue and weight loss.  HENT: Negative.   Eyes: Negative.   Respiratory: Negative.   Cardiovascular: Negative.   Gastrointestinal: Positive for abdominal pain. Negative for blood in stool, constipation, diarrhea, heartburn, melena, nausea and vomiting.  Genitourinary: Negative.   Musculoskeletal:       Some leg pain and balance issues secondary to the MS.  Skin: Negative.   Neurological: Negative.  Negative for weakness.  Endo/Heme/Allergies: Negative.   Psychiatric/Behavioral: The patient is nervous/anxious.    Blood pressure 133/79, pulse 110, temperature 99.8 F (37.7 C), temperature source Oral, resp. rate 18, SpO2 99 %. Physical Exam  Constitutional: She is oriented to person, place, and time. She appears well-developed and well-nourished. No distress.  HENT:  Head: Normocephalic.  Mouth/Throat: No oropharyngeal exudate.  Eyes: Right eye exhibits no discharge. Left eye exhibits no discharge. No scleral icterus.  Neck: Normal range of motion. Neck supple. No JVD present. No tracheal deviation present. No thyromegaly present.  Cardiovascular: Normal rate, regular rhythm, normal heart sounds and intact distal pulses.   No murmur heard. Respiratory: Effort normal and breath sounds normal. No respiratory distress. She has no wheezes. She has no rales. She exhibits no tenderness.  GI: Soft. Bowel sounds are normal. She exhibits no distension and no mass. There is tenderness (Mostly LLQ, now some on the right lower quadrant, pretty well controlled by the Percocet). There is no rebound and no guarding.  Musculoskeletal: She exhibits edema. She exhibits no tenderness.  Lymphadenopathy:    She has no cervical adenopathy.  Neurological: She is  alert and oriented to person, place, and time. No cranial nerve deficit.  Skin: Skin is warm and dry. No rash noted. She is not diaphoretic. No erythema. No pallor.  Psychiatric: She has a normal mood and affect. Her behavior is normal. Judgment and thought content normal.    Assessment/Plan: Sigmoid diverticulitis with microperforation;  inflammation left ovary secondary to diverticulitis Multiple sclerosis Hypertension   Plan:  Agree with medical management, IV antibiotics - Zosyn, bowel rest, just clear liquids for now, probiotics.  Will follow with you.      Saifullah Jolley 04/21/2016, 8:53 AM

## 2016-04-21 NOTE — ED Provider Notes (Signed)
[facility] DEPT Provider Note   CSN: [id number] Arrival date & time: 04/20/16  2055     History   Chief Complaint Chief Complaint  Patient presents with  . Abdominal Pain    HPI Sandra Bonilla is a 41 y.o. female.  The history is provided by the patient and medical records.   41 y.o. F with hxof HTN, MS, ovarian cysts, MS, presenting to the ED for abdominal pain.  Reports sudden onset of left sided abdominal pain about 2 hours ago.  Reports associated dysuria as well but has improved at this time.  Pain has now moved to the right side as well.  Does report some discomfort when bearing down to have a bowel movement.  Denies nausea, vomiting, diarrhea.  No fever or chills.  States her GYN last performed US in October 2017 which did not reveal any new ovarian cysts.  Has hx of kidney stones as well, last occurrence was last year.  States she was told they were small by her PCP.  She never saw urology for this.  No medications tried PTA.  Not currently on steroids or immunosuppressive therapy.  Past Medical History:  Diagnosis Date  . Hypertension   . MS (multiple sclerosis) [facility])     Patient Active Problem List   Diagnosis Date Noted  . Other headache syndrome 02/05/2016  . Vitamin D deficiency 10/09/2015  . Other fatigue 10/09/2015  . Dysesthesia 04/11/2015  . Depression with anxiety 04/11/2015  . Gait disturbance 12/13/2014  . Insomnia 12/13/2014  . Multiple sclerosis, relapsing-remitting ([place]) 08/30/2012  . Relapsing remitting multiple sclerosis ([place]) 08/30/2012    Past Surgical History:  Procedure Laterality Date  . CHOLECYSTECTOMY    . DILATION AND CURETTAGE, DIAGNOSTIC / THERAPEUTIC      OB History    No data available       Home Medications    Prior to Admission medications   Medication Sig Start Date End Date Taking? Authorizing Provider  busPIRone (BUSPAR) 15 MG tablet Take 15 mg by mouth 3 (three) times daily.    Yes Historical Provider, MD    chlorthalidone (HYGROTON) 25 MG tablet Take 12.5 mg by mouth daily.   Yes Historical Provider, MD  lamoTRIgine (LAMICTAL) 200 MG tablet Take 400 mg by mouth daily.    Yes Historical Provider, MD  loratadine (CLARITIN) 10 MG tablet Take 10 mg by mouth daily.   Yes Historical Provider, MD  Polyvinyl Alcohol-Povidone (REFRESH OP) Place 2 drops into both eyes 4 (four) times daily as needed (dryness).   Yes Historical Provider, MD  Teriflunomide (AUBAGIO) 14 MG TABS Take 14 mg by mouth daily. 03/08/15  Yes Britt Bottom, MD  doxepin (SINEQUAN) 25 MG capsule Take 1 capsule (25 mg total) by mouth at bedtime. Patient not taking: Reported on 02/05/2016 04/11/15   Britt Bottom, MD  HYDROcodone-acetaminophen (NORCO/VICODIN) 5-325 MG per tablet Take 1 tablet by mouth every 6 (six) hours as needed for moderate pain or severe pain. Patient not taking: Reported on 02/05/2016 12/13/13   Drenda Freeze, MD  imipramine (TOFRANIL) 25 MG tablet Take 1 tablet (25 mg total) by mouth at bedtime. Patient not taking: Reported on 04/21/2016 02/05/16   Britt Bottom, MD  traZODone (DESYREL) 100 MG tablet Take one half to one pill at bedtime by mouth. Patient not taking: Reported on 04/21/2016 10/09/15   Britt Bottom, MD    Family History Family History  Problem Relation Age of Onset  .  Breast cancer Maternal Aunt     Social History Social History  Substance Use Topics  . Smoking status: Former Research scientist (life sciences)  . Smokeless tobacco: Never Used  . Alcohol use No     Allergies   Septra [sulfamethoxazole-trimethoprim]   Review of Systems Review of Systems  Gastrointestinal: Positive for abdominal pain.  Genitourinary: Positive for dysuria.  All other systems reviewed and are negative.    Physical Exam Updated Vital Signs BP 125/82 (BP Location: Left Arm)   Pulse 108   Temp 100 F (37.8 C) (Oral)   Resp 18   SpO2 99%   Physical Exam  Constitutional: She is oriented to person, place, and time. She  appears well-developed and well-nourished.  HENT:  Head: Normocephalic and atraumatic.  Mouth/Throat: Oropharynx is clear and moist.  Eyes: Conjunctivae and EOM are normal. Pupils are equal, round, and reactive to light.  Neck: Normal range of motion.  Cardiovascular: Normal rate, regular rhythm and normal heart sounds.   Pulmonary/Chest: Effort normal and breath sounds normal. No respiratory distress. She has no wheezes.  Abdominal: Soft. Bowel sounds are normal. There is tenderness. There is no rebound and no CVA tenderness.    TTP of right and left lateral abdomen, no overt CVA tenderness, endorses pressure in LLQ but no tenderness in this area  Musculoskeletal: Normal range of motion.  Neurological: She is alert and oriented to person, place, and time.  Skin: Skin is warm and dry.  Psychiatric: She has a normal mood and affect.  Nursing note and vitals reviewed.    ED Treatments / Results  Labs (all labs ordered are listed, but only abnormal results are displayed) Labs Reviewed  COMPREHENSIVE METABOLIC PANEL - Abnormal; Notable for the following:       Result Value   Potassium 3.4 (*)    Chloride 99 (*)    Glucose, Bld 133 (*)    BUN 22 (*)    Creatinine, Ser 1.09 (*)    All other components within normal limits  CBC - Abnormal; Notable for the following:    WBC 16.0 (*)    All other components within normal limits  URINALYSIS, ROUTINE W REFLEX MICROSCOPIC - Abnormal; Notable for the following:    Ketones, ur 5 (*)    All other components within normal limits  LIPASE, BLOOD    EKG  EKG Interpretation None       Radiology Ct Renal Stone Study  Result Date: 04/21/2016 CLINICAL DATA:  Lower abdominal pain and dysuria. History of multiple sclerosis. EXAM: CT ABDOMEN AND PELVIS WITHOUT CONTRAST TECHNIQUE: Multidetector CT imaging of the abdomen and pelvis was performed following the standard protocol without IV contrast. COMPARISON:  12/13/2013 FINDINGS: Lower chest:  No acute abnormality. Hepatobiliary: No focal liver abnormality is seen. Status post cholecystectomy. No biliary dilatation. Pancreas: Unremarkable. No pancreatic ductal dilatation or surrounding inflammatory changes. Spleen: Normal in size without focal abnormality. Adrenals/Urinary Tract: Stable 2 cm low-density adenoma of the left adrenal gland. The right adrenal gland is normal. Both kidneys show no hydronephrosis. There is a tiny nonobstructing calculus in the mid left kidney. No ureteral or bladder abnormalities. Stomach/Bowel: There is an acute inflammatory process in the left pelvis abutting the sigmoid colon and left adnexal region. Based on the presence of diverticula of the sigmoid colon as well as a few foci of extraluminal air in this region, acute diverticulitis is favored. There does appear to be secondary inflammation of the left ovary/ adnexal region. No focal abscess  is identified. No significant free intraperitoneal air. Other bowel is unremarkable. The appendix is normal. Vascular/Lymphatic: No enlarged lymph nodes are identified. The abdominal aorta is of normal caliber. No calcified atherosclerotic plaque identified. Reproductive: As above, there is secondary inflammation of the left ovary, likely secondary to acute diverticulitis. Tubo-ovarian abscess is felt less likely. Other: No hernias identified. Musculoskeletal: No acute or significant osseous findings. IMPRESSION: 1. Acute inflammatory process in the left pelvis abutting the sigmoid colon and left adnexal region. Based on the presence of diverticulae of the sigmoid colon as well as some small foci of extra luminal air, acute diverticulitis is favored. No associated focal abscess or bowel obstruction. Tubo-ovarian abscess on the left is felt to be less likely. 2. Tiny nonobstructing left renal calculus. 3. Stable 2 cm left adrenal gland benign and adenoma. Electronically Signed   By: Aletta Edouard M.D.   On: 04/21/2016 08:10     Procedures Procedures (including critical care time)  Medications Ordered in ED Medications - No data to display   Initial Impression / Assessment and Plan / ED Course  I have reviewed the triage vital signs and the nursing notes.  Pertinent labs & imaging results that were available during my care of the patient were reviewed by me and considered in my medical decision making (see chart for details).  41 year old female here with abdominal pain. Began left lower, now extending to the right. She did have a low-grade fever on arrival but is nontoxic in appearance. She does have tenderness of the left and right lateral abdomen, endorses pressure in LLQ as well.  Labs with leukocytosis of 16K.  UA without signs of infection.  CT renal study obtained, evidence of likely diverticulitis with small foci of extraluminal air.  No abscess noted.  Results discussed with patient.  Will start on IV abx and admit for ongoing care given free air.  General surgery has evaluated patient and provided formal consultation, recommended zosyn which was ordered.  They will follow along.  Appreciate their input.  Patient admitted to medicine service for ongoing care.  Final Clinical Impressions(s) / ED Diagnoses   Final diagnoses:  Diverticulitis of large intestine with perforation without bleeding  Multiple sclerosis [facility])    New Prescriptions New Prescriptions   No medications on file     Kathryne Hitch 04/21/16 1030    April Palumbo, MD 04/22/16 2344

## 2016-04-21 NOTE — ED Notes (Addendum)
Surgery team at bedside. Will attempt IV insertion and antibiotic administration once surgery team is finished at bedside.

## 2016-04-21 NOTE — H&P (Signed)
History and Physical    Sandra Bonilla [id number] DOB: Dec 20, 1975 DOA: 04/21/2016  PCP: Volanda Napoleon, MD   Patient coming from: Home  Chief Complaint: Abdominal pain.   HPI: Sandra Bonilla is a 41 y.o. female with medical history significant of MS, who presents with abdominal pain. Localized left lower quadrant, 10/10 in intensity,sharp and colic in nature, worse with po intake, improve with pain medications, associated with fever, no nausea, vomiting or diarrhea. Radiated to the right side.   No history of constipation, no diverticulitis episodes in the past. MS is well controlled.    ED Course: Diagnosed diverticulitis, with micro-perforations, surgery consulted, referred for admission.    Review of Systems:  1. General. Positive for fever, no weight gain or weight loss 2. ENT: no runny nose, no sore throat 3. Pulmonary; No shortness of breath, no cough, no hemoptysis 4. Cardiovascular. No chest pain, no angina or claudication. 5, Gastrointestinal. Positive for abdominal pain, no nausea or vomiting 6, Hematology. No frequent infections 7. Urology. No dysuria 8. Neurology. No seizures or paresthesias 9. Dermatology. No rashes 10. Endocrine. No heat or cold intolerance.    Past Medical History:  Diagnosis Date  . Hypertension   . MS (multiple sclerosis) ([place])     Past Surgical History:  Procedure Laterality Date  . CHOLECYSTECTOMY    . DILATION AND CURETTAGE, DIAGNOSTIC / THERAPEUTIC       reports that she has quit smoking. She has never used smokeless tobacco. She reports that she does not drink alcohol or use drugs.  Allergies  Allergen Reactions  . Septra [Sulfamethoxazole-Trimethoprim] Rash    Family History  Problem Relation Age of Onset  . Breast cancer Maternal Aunt    Unacceptable: Noncontributory, unremarkable, or negative. Acceptable: Family history reviewed and not pertinent (If you reviewed it)  Prior to Admission medications     Medication Sig Start Date End Date Taking? Authorizing Provider  busPIRone (BUSPAR) 15 MG tablet Take 15 mg by mouth 3 (three) times daily.    Yes Historical Provider, MD  chlorthalidone (HYGROTON) 25 MG tablet Take 12.5 mg by mouth daily.   Yes Historical Provider, MD  lamoTRIgine (LAMICTAL) 200 MG tablet Take 400 mg by mouth daily.    Yes Historical Provider, MD  loratadine (CLARITIN) 10 MG tablet Take 10 mg by mouth daily.   Yes Historical Provider, MD  Polyvinyl Alcohol-Povidone (REFRESH OP) Place 2 drops into both eyes 4 (four) times daily as needed (dryness).   Yes Historical Provider, MD  Teriflunomide (AUBAGIO) 14 MG TABS Take 14 mg by mouth daily. 03/08/15  Yes Britt Bottom, MD  doxepin (SINEQUAN) 25 MG capsule Take 1 capsule (25 mg total) by mouth at bedtime. Patient not taking: Reported on 02/05/2016 04/11/15   Britt Bottom, MD  HYDROcodone-acetaminophen (NORCO/VICODIN) 5-325 MG per tablet Take 1 tablet by mouth every 6 (six) hours as needed for moderate pain or severe pain. Patient not taking: Reported on 02/05/2016 12/13/13   Drenda Freeze, MD  imipramine (TOFRANIL) 25 MG tablet Take 1 tablet (25 mg total) by mouth at bedtime. Patient not taking: Reported on 04/21/2016 02/05/16   Britt Bottom, MD  traZODone (DESYREL) 100 MG tablet Take one half to one pill at bedtime by mouth. Patient not taking: Reported on 04/21/2016 10/09/15   Britt Bottom, MD    Physical Exam: Vitals:   04/21/16 0553 04/21/16 0730 04/21/16 0811 04/21/16 0948  BP: 125/82 133/79  121/84  Pulse:  108 110  98  Resp: 18 18  18   Temp: 100 F (37.8 C)  99.8 F (37.7 C)   TempSrc: Oral  Oral   SpO2: 99% 99%  95%      Constitutional: deconditioned, not in pain (after analgesia)  Vitals:   04/21/16 0553 04/21/16 0730 04/21/16 0811 04/21/16 0948  BP: 125/82 133/79  121/84  Pulse: 108 110  98  Resp: 18 18  18   Temp: 100 F (37.8 C)  99.8 F (37.7 C)   TempSrc: Oral  Oral   SpO2: 99% 99%  95%    Eyes: PERRL, lids and conjunctivae normal, no pallor or icterus.  Head normocephalic, nose and ears no deformities.  ENMT: Mucous membranes are moist. Posterior pharynx clear of any exudate or lesions.Normal dentition.  Neck: normal, supple, no masses, no thyromegaly Respiratory: clear to auscultation bilaterally, no wheezing, no crackles. Normal respiratory effort. No accessory muscle use.  Cardiovascular: Regular rate and rhythm, no murmurs / rubs / gallops. No extremity edema. 2+ pedal pulses. No carotid bruits.  Abdomen: mild distention, no tenderness, no masses palpated. No hepatosplenomegaly. Bowel sounds positive.  (exam after percocet)  Musculoskeletal: no clubbing / cyanosis. No joint deformity upper and lower extremities. Good ROM, no contractures. Normal muscle tone.  Skin: no rashes, lesions, ulcers. No induration Neurologic: CN 2-12 grossly intact. Sensation intact, DTR normal. Strength 5/5 in all 4.   Labs on Admission: I have personally reviewed following labs and imaging studies  CBC:  Recent Labs Lab 04/20/16 2142  WBC 16.0*  HGB 13.9  HCT 42.1  MCV 83.4  PLT 0000000   Basic Metabolic Panel:  Recent Labs Lab 04/20/16 2142  NA 137  K 3.4*  CL 99*  CO2 29  GLUCOSE 133*  BUN 22*  CREATININE 1.09*  CALCIUM 9.1   GFR: CrCl cannot be calculated (Unknown ideal weight.). Liver Function Tests:  Recent Labs Lab 04/20/16 2142  AST 26  ALT 25  ALKPHOS 116  BILITOT 0.7  PROT 7.7  ALBUMIN 4.5    Recent Labs Lab 04/20/16 2142  LIPASE 29   No results for input(s): AMMONIA in the last 168 hours. Coagulation Profile: No results for input(s): INR, PROTIME in the last 168 hours. Cardiac Enzymes: No results for input(s): CKTOTAL, CKMB, CKMBINDEX, TROPONINI in the last 168 hours. BNP (last 3 results) No results for input(s): PROBNP in the last 8760 hours. HbA1C: No results for input(s): HGBA1C in the last 72 hours. CBG: No results for input(s): GLUCAP in  the last 168 hours. Lipid Profile: No results for input(s): CHOL, HDL, LDLCALC, TRIG, CHOLHDL, LDLDIRECT in the last 72 hours. Thyroid Function Tests: No results for input(s): TSH, T4TOTAL, FREET4, T3FREE, THYROIDAB in the last 72 hours. Anemia Panel: No results for input(s): VITAMINB12, FOLATE, FERRITIN, TIBC, IRON, RETICCTPCT in the last 72 hours. Urine analysis:    Component Value Date/Time   COLORURINE YELLOW 04/21/2016 0425   APPEARANCEUR CLEAR 04/21/2016 0425   LABSPEC 1.029 04/21/2016 0425   PHURINE 5.0 04/21/2016 0425   GLUCOSEU NEGATIVE 04/21/2016 0425   HGBUR NEGATIVE 04/21/2016 0425   BILIRUBINUR NEGATIVE 04/21/2016 0425   KETONESUR 5 (A) 04/21/2016 0425   PROTEINUR NEGATIVE 04/21/2016 0425   UROBILINOGEN 0.2 12/12/2013 2356   NITRITE NEGATIVE 04/21/2016 0425   LEUKOCYTESUR NEGATIVE 04/21/2016 0425   Sepsis Labs: !!!!!!!!!!!!!!!!!!!!!!!!!!!!!!!!!!!!!!!!!!!! @LABRCNTIP (procalcitonin:4,lacticidven:4) )No results found for this or any previous visit (from the past 240 hour(s)).   Radiological Exams on Admission: Ct Renal Stone Study  Result Date: 04/21/2016 CLINICAL DATA:  Lower abdominal pain and dysuria. History of multiple sclerosis. EXAM: CT ABDOMEN AND PELVIS WITHOUT CONTRAST TECHNIQUE: Multidetector CT imaging of the abdomen and pelvis was performed following the standard protocol without IV contrast. COMPARISON:  12/13/2013 FINDINGS: Lower chest: No acute abnormality. Hepatobiliary: No focal liver abnormality is seen. Status post cholecystectomy. No biliary dilatation. Pancreas: Unremarkable. No pancreatic ductal dilatation or surrounding inflammatory changes. Spleen: Normal in size without focal abnormality. Adrenals/Urinary Tract: Stable 2 cm low-density adenoma of the left adrenal gland. The right adrenal gland is normal. Both kidneys show no hydronephrosis. There is a tiny nonobstructing calculus in the mid left kidney. No ureteral or bladder abnormalities.  Stomach/Bowel: There is an acute inflammatory process in the left pelvis abutting the sigmoid colon and left adnexal region. Based on the presence of diverticula of the sigmoid colon as well as a few foci of extraluminal air in this region, acute diverticulitis is favored. There does appear to be secondary inflammation of the left ovary/ adnexal region. No focal abscess is identified. No significant free intraperitoneal air. Other bowel is unremarkable. The appendix is normal. Vascular/Lymphatic: No enlarged lymph nodes are identified. The abdominal aorta is of normal caliber. No calcified atherosclerotic plaque identified. Reproductive: As above, there is secondary inflammation of the left ovary, likely secondary to acute diverticulitis. Tubo-ovarian abscess is felt less likely. Other: No hernias identified. Musculoskeletal: No acute or significant osseous findings. IMPRESSION: 1. Acute inflammatory process in the left pelvis abutting the sigmoid colon and left adnexal region. Based on the presence of diverticulae of the sigmoid colon as well as some small foci of extra luminal air, acute diverticulitis is favored. No associated focal abscess or bowel obstruction. Tubo-ovarian abscess on the left is felt to be less likely. 2. Tiny nonobstructing left renal calculus. 3. Stable 2 cm left adrenal gland benign and adenoma. Electronically Signed   By: Aletta Edouard M.D.   On: 04/21/2016 08:10    EKG: Independently reviewed.NA  Assessment/Plan Active Problems:   Diverticulitis   This is a 41 year old female with history of multiple sclerosis, presents with a chief complaint abdominal pain, localized left lower quadrant. Severe intensity, no associated nausea or vomiting. On initial physical examination temperature was 100.2, blood pressure 127/84, heart rate 118, respiratory 18, saturation 96% room air. Her oral mucosa is moist, her abdomen is mildly distended, after narcotics is not tender anymore. Her lungs  are clear to auscultation, heart S1-S2 present and rhythmic. Sodium 137, potassium 3.4, chloride 99, bicarbonate 29, glucose 133, creatinine 1.09, white count 16.0, hemoglobin 13.9, hematocrit 42.1, platelets 304. Urine negative for infection. CT of the abdomen and pelvis showing acute inflammatory process in the left pelvis with diverticuli suggesting diverticulosis with a small foci of extraluminal air. No abscess.   Working diagnosis: Acute diverticulitis complicated by suspected micro-perforations and sepsis.  1. Acute diverticulitis. The patient will be admitted to the medical floor, she will be placed on IV fluids, antiemetics and analgesia. Nothing by mouth except ice chips and meds. Antibiotic therapy with Zosyn, surgery has been consulted.  2. Sepsis per SIRS criteria. She does have leukocytosis and tachycardia. Will continue broad-spectrum antibiotic therapy, will continue IV fluids with isotonic saline, on her case, at this point of her current illness, will follow a restricted fluid strategy. Follow-up on cultures, cell count and temperature curve.  3. Multiple sclerosis. Stable, continue teriflunomide, currently no signs of exacerbation. Continue Lamictal  4. Depression. Continue buspirone.   DVT prophylaxis:  enoxaparin Code Status: Full  Family Communication: No family at the bedside Disposition Plan: Home  Consults called: Surgery Admission status: Inpatient.    Mauricio Gerome Apley MD [facility] Pager [phone]  If 7PM-7AM, please contact night-coverage [email or web address] Password [facility]  04/21/2016, 10:26 AM

## 2016-04-22 DIAGNOSIS — G35 Multiple sclerosis: Secondary | ICD-10-CM

## 2016-04-22 DIAGNOSIS — I10 Essential (primary) hypertension: Secondary | ICD-10-CM

## 2016-04-22 DIAGNOSIS — K631 Perforation of intestine (nontraumatic): Secondary | ICD-10-CM

## 2016-04-22 DIAGNOSIS — K5720 Diverticulitis of large intestine with perforation and abscess without bleeding: Secondary | ICD-10-CM

## 2016-04-22 DIAGNOSIS — K5732 Diverticulitis of large intestine without perforation or abscess without bleeding: Secondary | ICD-10-CM

## 2016-04-22 LAB — CBC
HEMATOCRIT: 39.3 % (ref 36.0–46.0)
Hemoglobin: 12.8 g/dL (ref 12.0–15.0)
MCH: 27.2 pg (ref 26.0–34.0)
MCHC: 32.6 g/dL (ref 30.0–36.0)
MCV: 83.4 fL (ref 78.0–100.0)
PLATELETS: 298 10*3/uL (ref 150–400)
RBC: 4.71 MIL/uL (ref 3.87–5.11)
RDW: 14.8 % (ref 11.5–15.5)
WBC: 19 10*3/uL — AB (ref 4.0–10.5)

## 2016-04-22 LAB — COMPREHENSIVE METABOLIC PANEL
ALK PHOS: 93 U/L (ref 38–126)
ALT: 19 U/L (ref 14–54)
AST: 17 U/L (ref 15–41)
Albumin: 3.8 g/dL (ref 3.5–5.0)
Anion gap: 9 (ref 5–15)
BILIRUBIN TOTAL: 1.5 mg/dL — AB (ref 0.3–1.2)
BUN: 12 mg/dL (ref 6–20)
CALCIUM: 8.3 mg/dL — AB (ref 8.9–10.3)
CO2: 28 mmol/L (ref 22–32)
Chloride: 100 mmol/L — ABNORMAL LOW (ref 101–111)
Creatinine, Ser: 0.91 mg/dL (ref 0.44–1.00)
GFR calc Af Amer: >60 mL/min (ref >60)
Glucose, Bld: 107 mg/dL — ABNORMAL HIGH (ref 65–99)
POTASSIUM: 3.5 mmol/L (ref 3.5–5.1)
Sodium: 137 mmol/L (ref 135–145)
TOTAL PROTEIN: 7 g/dL (ref 6.5–8.1)

## 2016-04-22 MED ORDER — KCL IN DEXTROSE-NACL 20-5-0.9 MEQ/L-%-% IV SOLN
INTRAVENOUS | Status: DC
  Administered 2016-04-22 – 2016-04-25 (×5): via INTRAVENOUS
  Filled 2016-04-22 (×6): qty 1000

## 2016-04-22 NOTE — Progress Notes (Addendum)
PROGRESS NOTE  Sandra Bonilla [id number] DOB: 1975/08/10 DOA: 04/21/2016 PCP: Volanda Napoleon, MD  Brief History:  41 year old female with a history of hypertension multiple sclerosis presenting with one-day history of left lower quadrant abdominal pain with subjective fevers and chills. She had denied any nausea, vomiting, or diarrhea. CT imaging of the abdomen showed acute inflammation in the left pelvis abutting the sigmoid colon and left adnexa with a few foci of extraluminal air favoring acute diverticulitis. The patient was started on bowel rest, intravenous fluids, and intravenous antibiotics. General surgery was consulted to assist with management.  Assessment/Plan: Sepsis  -Secondary to diverticulitis -Continue IV fluids -Continue IV antibiotics  Sigmoid diverticulitis with microperforation -Inflammation in the left adnexal likely secondary to diverticulitis -Continue intravenous Zosyn 1/22>>> -Continue fluids--add K to fluids -Remain nothing by mouth -WBC slowly decreasing -appreciate general surgery followup  HTN -holding chlorthalidone -BP controlled  Multiple sclerosis.  -Stable, continue teriflunomide   Depression/Anxiety -Continue buspirone and Lamictal   Disposition Plan:   Home in 2-3 days  Family Communication:   No Family at bedside  Consultants: general surgery   Code Status:  FULL / DNR  DVT Prophylaxis:  SCDs   Procedures: As Listed in Progress Note Above  Antibiotics: Zosyn 04/21/16>>>    Subjective: Abdominal pain is better than yesterday. The patient denies fevers, chills, headache, chest pain, short of breath, nausea, vomiting, diarrhea, dysuria, hematuria.  Objective: Vitals:   04/21/16 0948 04/21/16 1300 04/21/16 2045 04/22/16 0521  BP: 121/84 (!) 134/94 135/84 132/89  Pulse: 98 (!) 106 (!) 103 (!) 107  Resp: 18 18 16 18   Temp:   98.4 F (36.9 C) 98.3 F (36.8 C)  TempSrc:   Oral Oral  SpO2: 95% 100%  100% 96%  Weight:  102.1 kg (225 lb 1.4 oz)      Intake/Output Summary (Last 24 hours) at 04/22/16 1034 Last data filed at 04/21/16 1500  Gross per 24 hour  Intake           291.25 ml  Output                0 ml  Net           291.25 ml   Weight change:  Exam:   General:  Pt is alert, follows commands appropriately, not in acute distress  HEENT: No icterus, No thrush, No neck mass, [place]/AT  Cardiovascular: RRR, S1/S2, no rubs, no gallops  Respiratory: CTA bilaterally, no wheezing, no crackles, no rhonchi  Abdomen: Soft/+BS, non tender, non distended, no guarding  Extremities: 1+ LE edema, No lymphangitis, No petechiae, No rashes, no synovitis   Data Reviewed: I have personally reviewed following labs and imaging studies Basic Metabolic Panel:  Recent Labs Lab 04/20/16 2142 04/21/16 1136 04/22/16 0352  NA 137  --  137  K 3.4*  --  3.5  CL 99*  --  100*  CO2 29  --  28  GLUCOSE 133*  --  107*  BUN 22*  --  12  CREATININE 1.09* 0.96 0.91  CALCIUM 9.1  --  8.3*   Liver Function Tests:  Recent Labs Lab 04/20/16 2142 04/22/16 0352  AST 26 17  ALT 25 19  ALKPHOS 116 93  BILITOT 0.7 1.5*  PROT 7.7 7.0  ALBUMIN 4.5 3.8    Recent Labs Lab 04/20/16 2142  LIPASE 29   No results for input(s): AMMONIA in the last  168 hours. Coagulation Profile: No results for input(s): INR, PROTIME in the last 168 hours. CBC:  Recent Labs Lab 04/20/16 2142 04/21/16 1136 04/22/16 0352  WBC 16.0* 20.2* 19.0*  HGB 13.9 13.3 12.8  HCT 42.1 40.5 39.3  MCV 83.4 82.8 83.4  PLT 384 345 298   Cardiac Enzymes: No results for input(s): CKTOTAL, CKMB, CKMBINDEX, TROPONINI in the last 168 hours. BNP: Invalid input(s): POCBNP CBG: No results for input(s): GLUCAP in the last 168 hours. HbA1C: No results for input(s): HGBA1C in the last 72 hours. Urine analysis:    Component Value Date/Time   COLORURINE YELLOW 04/21/2016 0425   APPEARANCEUR CLEAR 04/21/2016 0425   LABSPEC  1.029 04/21/2016 0425   PHURINE 5.0 04/21/2016 0425   GLUCOSEU NEGATIVE 04/21/2016 0425   HGBUR NEGATIVE 04/21/2016 0425   BILIRUBINUR NEGATIVE 04/21/2016 0425   KETONESUR 5 (A) 04/21/2016 0425   PROTEINUR NEGATIVE 04/21/2016 0425   UROBILINOGEN 0.2 12/12/2013 2356   NITRITE NEGATIVE 04/21/2016 0425   LEUKOCYTESUR NEGATIVE 04/21/2016 0425   Sepsis Labs: @LABRCNTIP (procalcitonin:4,lacticidven:4) )No results found for this or any previous visit (from the past 240 hour(s)).   Scheduled Meds: . busPIRone  15 mg Oral TID  . enoxaparin (LOVENOX) injection  40 mg Subcutaneous Q24H  . lamoTRIgine  400 mg Oral Daily  . loratadine  10 mg Oral Daily  . pantoprazole  40 mg Oral Daily  . piperacillin-tazobactam (ZOSYN)  IV  3.375 g Intravenous Q8H  . Teriflunomide  14 mg Oral Daily   Continuous Infusions: . dextrose 5 % and 0.9 % NaCl with KCl 20 mEq/L 75 mL/hr at 04/22/16 [id number]    Procedures/Studies: Ct Renal Stone Study  Result Date: 04/21/2016 CLINICAL DATA:  Lower abdominal pain and dysuria. History of multiple sclerosis. EXAM: CT ABDOMEN AND PELVIS WITHOUT CONTRAST TECHNIQUE: Multidetector CT imaging of the abdomen and pelvis was performed following the standard protocol without IV contrast. COMPARISON:  12/13/2013 FINDINGS: Lower chest: No acute abnormality. Hepatobiliary: No focal liver abnormality is seen. Status post cholecystectomy. No biliary dilatation. Pancreas: Unremarkable. No pancreatic ductal dilatation or surrounding inflammatory changes. Spleen: Normal in size without focal abnormality. Adrenals/Urinary Tract: Stable 2 cm low-density adenoma of the left adrenal gland. The right adrenal gland is normal. Both kidneys show no hydronephrosis. There is a tiny nonobstructing calculus in the mid left kidney. No ureteral or bladder abnormalities. Stomach/Bowel: There is an acute inflammatory process in the left pelvis abutting the sigmoid colon and left adnexal region. Based on the  presence of diverticula of the sigmoid colon as well as a few foci of extraluminal air in this region, acute diverticulitis is favored. There does appear to be secondary inflammation of the left ovary/ adnexal region. No focal abscess is identified. No significant free intraperitoneal air. Other bowel is unremarkable. The appendix is normal. Vascular/Lymphatic: No enlarged lymph nodes are identified. The abdominal aorta is of normal caliber. No calcified atherosclerotic plaque identified. Reproductive: As above, there is secondary inflammation of the left ovary, likely secondary to acute diverticulitis. Tubo-ovarian abscess is felt less likely. Other: No hernias identified. Musculoskeletal: No acute or significant osseous findings. IMPRESSION: 1. Acute inflammatory process in the left pelvis abutting the sigmoid colon and left adnexal region. Based on the presence of diverticulae of the sigmoid colon as well as some small foci of extra luminal air, acute diverticulitis is favored. No associated focal abscess or bowel obstruction. Tubo-ovarian abscess on the left is felt to be less likely. 2. Tiny nonobstructing left renal  calculus. 3. Stable 2 cm left adrenal gland benign and adenoma. Electronically Signed   By: Aletta Edouard M.D.   On: 04/21/2016 08:10    Yussuf Sawyers, DO  [facility] Pager [phone]  If 7PM-7AM, please contact night-coverage [email or web address] Password [email or web address] 04/22/2016, 10:34 AM   LOS: 1 day

## 2016-04-22 NOTE — Progress Notes (Signed)
Subjective: She had more pain last PM and pain is more in upper abdomen last PM.  Right now it's not to bad.    Objective: Vital signs in last 24 hours: Temp:  [98.3 F (36.8 C)-99.8 F (37.7 C)] 98.3 F (36.8 C) (01/23 0521) Pulse Rate:  [98-107] 107 (01/23 0521) Resp:  [16-18] 18 (01/23 0521) BP: (121-135)/(84-94) 132/89 (01/23 0521) SpO2:  [95 %-100 %] 96 % (01/23 0521) Weight:  [102.1 kg (225 lb 1.4 oz)] 102.1 kg (225 lb 1.4 oz) (01/22 1300) Last BM Date: 04/20/16 I/O = ? TM 100, VSS, tachycardic WBC is up CMP OK  Intake/Output from previous day: 01/22 0701 - 01/23 0700 In: 291.3 [I.V.:291.3] Out: -  Intake/Output this shift: No intake/output data recorded.  General appearance: alert, cooperative and no distress GI: soft, not real tender,but having pain more along the top RUQ and LUQ last PM.  Lab Results:   Recent Labs  04/21/16 1136 04/22/16 0352  WBC 20.2* 19.0*  HGB 13.3 12.8  HCT 40.5 39.3  PLT 345 298    BMET  Recent Labs  04/20/16 2142 04/21/16 1136 04/22/16 0352  NA 137  --  137  K 3.4*  --  3.5  CL 99*  --  100*  CO2 29  --  28  GLUCOSE 133*  --  107*  BUN 22*  --  12  CREATININE 1.09* 0.96 0.91  CALCIUM 9.1  --  8.3*   PT/INR No results for input(s): LABPROT, INR in the last 72 hours.   Recent Labs Lab 04/20/16 2142 04/22/16 0352  AST 26 17  ALT 25 19  ALKPHOS 116 93  BILITOT 0.7 1.5*  PROT 7.7 7.0  ALBUMIN 4.5 3.8     Lipase     Component Value Date/Time   LIPASE 29 04/20/2016 2142     Studies/Results: Ct Renal Stone Study  Result Date: 04/21/2016 CLINICAL DATA:  Lower abdominal pain and dysuria. History of multiple sclerosis. EXAM: CT ABDOMEN AND PELVIS WITHOUT CONTRAST TECHNIQUE: Multidetector CT imaging of the abdomen and pelvis was performed following the standard protocol without IV contrast. COMPARISON:  12/13/2013 FINDINGS: Lower chest: No acute abnormality. Hepatobiliary: No focal liver abnormality is seen.  Status post cholecystectomy. No biliary dilatation. Pancreas: Unremarkable. No pancreatic ductal dilatation or surrounding inflammatory changes. Spleen: Normal in size without focal abnormality. Adrenals/Urinary Tract: Stable 2 cm low-density adenoma of the left adrenal gland. The right adrenal gland is normal. Both kidneys show no hydronephrosis. There is a tiny nonobstructing calculus in the mid left kidney. No ureteral or bladder abnormalities. Stomach/Bowel: There is an acute inflammatory process in the left pelvis abutting the sigmoid colon and left adnexal region. Based on the presence of diverticula of the sigmoid colon as well as a few foci of extraluminal air in this region, acute diverticulitis is favored. There does appear to be secondary inflammation of the left ovary/ adnexal region. No focal abscess is identified. No significant free intraperitoneal air. Other bowel is unremarkable. The appendix is normal. Vascular/Lymphatic: No enlarged lymph nodes are identified. The abdominal aorta is of normal caliber. No calcified atherosclerotic plaque identified. Reproductive: As above, there is secondary inflammation of the left ovary, likely secondary to acute diverticulitis. Tubo-ovarian abscess is felt less likely. Other: No hernias identified. Musculoskeletal: No acute or significant osseous findings. IMPRESSION: 1. Acute inflammatory process in the left pelvis abutting the sigmoid colon and left adnexal region. Based on the presence of diverticulae of the sigmoid colon as  well as some small foci of extra luminal air, acute diverticulitis is favored. No associated focal abscess or bowel obstruction. Tubo-ovarian abscess on the left is felt to be less likely. 2. Tiny nonobstructing left renal calculus. 3. Stable 2 cm left adrenal gland benign and adenoma. Electronically Signed   By: Aletta Edouard M.D.   On: 04/21/2016 08:10    Medications: . busPIRone  15 mg Oral TID  . enoxaparin (LOVENOX) injection   40 mg Subcutaneous Q24H  . lamoTRIgine  400 mg Oral Daily  . loratadine  10 mg Oral Daily  . pantoprazole  40 mg Oral Daily  . piperacillin-tazobactam (ZOSYN)  IV  3.375 g Intravenous Q8H  . Teriflunomide  14 mg Oral Daily   . dextrose 5 % and 0.9% NaCl 75 mL/hr at 04/21/16 1107    Assessment/Plan Sigmoid diverticulitis with microperforation;  inflammation left ovary secondary to diverticulitis Multiple sclerosis Hypertension FEN: IV fluids/NPO ID: Zosyn 04/21/16 =>> day 2 DVT:  Lovenox   Plan: Continue antibiotics, hydration and just sips of clears for now.  Recheck CBC in the AM. Add some K+ to IV fluids.    LOS: 1 day    JENNINGS,WILLARD 04/22/2016 [phone]

## 2016-04-23 DIAGNOSIS — I10 Essential (primary) hypertension: Secondary | ICD-10-CM

## 2016-04-23 DIAGNOSIS — K5720 Diverticulitis of large intestine with perforation and abscess without bleeding: Secondary | ICD-10-CM

## 2016-04-23 DIAGNOSIS — G35 Multiple sclerosis: Secondary | ICD-10-CM

## 2016-04-23 LAB — BASIC METABOLIC PANEL
Anion gap: 10 (ref 5–15)
BUN: 11 mg/dL (ref 6–20)
CALCIUM: 8.2 mg/dL — AB (ref 8.9–10.3)
CO2: 26 mmol/L (ref 22–32)
CREATININE: 0.79 mg/dL (ref 0.44–1.00)
Chloride: 100 mmol/L — ABNORMAL LOW (ref 101–111)
GFR calc non Af Amer: >60 mL/min (ref >60)
Glucose, Bld: 84 mg/dL (ref 65–99)
Potassium: 3.3 mmol/L — ABNORMAL LOW (ref 3.5–5.1)
Sodium: 136 mmol/L (ref 135–145)

## 2016-04-23 LAB — CBC
HEMATOCRIT: 37.5 % (ref 36.0–46.0)
Hemoglobin: 12 g/dL (ref 12.0–15.0)
MCH: 26.7 pg (ref 26.0–34.0)
MCHC: 32 g/dL (ref 30.0–36.0)
MCV: 83.5 fL (ref 78.0–100.0)
Platelets: 301 10*3/uL (ref 150–400)
RBC: 4.49 MIL/uL (ref 3.87–5.11)
RDW: 14.8 % (ref 11.5–15.5)
WBC: 18.2 10*3/uL — ABNORMAL HIGH (ref 4.0–10.5)

## 2016-04-23 MED ORDER — POTASSIUM CHLORIDE CRYS ER 20 MEQ PO TBCR
20.0000 meq | EXTENDED_RELEASE_TABLET | Freq: Two times a day (BID) | ORAL | Status: DC
  Administered 2016-04-23 – 2016-04-25 (×5): 20 meq via ORAL
  Filled 2016-04-23 (×5): qty 1

## 2016-04-23 NOTE — Progress Notes (Signed)
  Subjective: She is up in chair right now and feels better.  Some pain but not enough right now to take pain meds.  Pain is still upper left abdomen this AM.    Objective: Vital signs in last 24 hours: Temp:  [97.5 F (36.4 C)-98.7 F (37.1 C)] 98.5 F (36.9 C) (01/24 0457) Pulse Rate:  [91-118] 102 (01/24 0457) Resp:  [16-18] 16 (01/24 0457) BP: (104-147)/(71-82) 117/76 (01/24 0457) SpO2:  [97 %-100 %] 97 % (01/24 0457) Last BM Date: 04/20/16 661 IV  Urine - voided x 5 Afebrile, VSS WBC still up 18.2 this AM K+ 3.3 Intake/Output from previous day: 01/23 0701 - 01/24 0700 In: 661.3 [I.V.:611.3; IV Piggyback:50] Out: -  Intake/Output this shift: No intake/output data recorded.  General appearance: alert, cooperative and no distress GI: soft, non-tender; bowel sounds normal; no masses,  no organomegaly    Lab Results:   Recent Labs  04/22/16 0352 04/23/16 0357  WBC 19.0* 18.2*  HGB 12.8 12.0  HCT 39.3 37.5  PLT 298 301    BMET  Recent Labs  04/22/16 0352 04/23/16 0357  NA 137 136  K 3.5 3.3*  CL 100* 100*  CO2 28 26  GLUCOSE 107* 84  BUN 12 11  CREATININE 0.91 0.79  CALCIUM 8.3* 8.2*   PT/INR No results for input(s): LABPROT, INR in the last 72 hours.   Recent Labs Lab 04/20/16 2142 04/22/16 0352  AST 26 17  ALT 25 19  ALKPHOS 116 93  BILITOT 0.7 1.5*  PROT 7.7 7.0  ALBUMIN 4.5 3.8     Lipase     Component Value Date/Time   LIPASE 29 04/20/2016 2142     Studies/Results: No results found.  Medications: . busPIRone  15 mg Oral TID  . enoxaparin (LOVENOX) injection  40 mg Subcutaneous Q24H  . lamoTRIgine  400 mg Oral Daily  . loratadine  10 mg Oral Daily  . pantoprazole  40 mg Oral Daily  . piperacillin-tazobactam (ZOSYN)  IV  3.375 g Intravenous Q8H  . Teriflunomide  14 mg Oral Daily    Assessment/Plan Sigmoid diverticulitis with microperforation; inflammation left ovary secondary to diverticulitis Multiple  sclerosis Hypertension FEN: IV fluids/NPO ID: Zosyn 04/21/16 =>> day 3 DVT:  Lovenox   Plan:  Continue antibiotics, clear liquids, and replace K+.     LOS: 2 days    JENNINGS,WILLARD 04/23/2016 [phone]

## 2016-04-23 NOTE — Progress Notes (Signed)
PROGRESS NOTE    Sandra Bonilla  [id number] DOB: February 22, 1976 DOA: 04/21/2016 PCP: Volanda Napoleon, MD   Brief Narrative: Sandra Bonilla is a 41 y.o. female with a history of hypertension multiple sclerosis presenting with one-day history of left lower quadrant abdominal pain with subjective fevers and chills. She had denied any nausea, vomiting, or diarrhea. CT imaging of the abdomen showed acute inflammation in the left pelvis abutting the sigmoid colon and left adnexa with a few foci of extraluminal air favoring acute diverticulitis. The patient was started on bowel rest, intravenous fluids, and intravenous antibiotics. General surgery was consulted to assist with management.   Assessment & Plan:   Active Problems:   Diverticulitis   Diverticulitis of large intestine with perforation without bleeding   Multiple sclerosis ([place])   Essential hypertension   Sepsis Secondary to diverticulitis -continue IV fluids -continue antibiotics  Sigmoid diverticulitis with microperforation Patient has associated inflammation of the left adnexa -Continue Zosyn -Continue fluids -Advance diet per surgery  Essential hypertension Controlled -Continue to hold chlorthalidone  Multiple sclerosis Stable -continue teriflunomide  Depression/anxiety -continue buspirone and lamictal   DVT prophylaxis: SCDs Code Status: Full code Family Communication: None at bedside Disposition Plan: Likely discharge home in 2-3 days if tolerating diet   Consultants:   General surgery  Procedures:   None  Antimicrobials:  Zosyn (1/22>>   Subjective: She reports improvement in symptoms. He has some left upper quadrant pain and some pressure with her bowel movement this morning.  Objective: Vitals:   04/22/16 1607 04/22/16 2125 04/23/16 0457 04/23/16 1428  BP: (!) 147/71 121/82 117/76 (!) 141/88  Pulse: 91 (!) 102 (!) 102 (!) 107  Resp: 18 18 16 17   Temp: 98.7 F (37.1 C)  98.3 F (36.8 C) 98.5 F (36.9 C) 97.4 F (36.3 C)  TempSrc: Oral Oral Oral Oral  SpO2: 100% 100% 97% 100%  Weight:        Intake/Output Summary (Last 24 hours) at 04/23/16 1637 Last data filed at 04/22/16 1800  Gross per 24 hour  Intake           661.25 ml  Output                0 ml  Net           661.25 ml   Filed Weights   04/21/16 1300  Weight: 102.1 kg (225 lb 1.4 oz)    Examination:  General exam: Appears calm and comfortable Respiratory system: Clear to auscultation. Respiratory effort normal. Cardiovascular system: S1 & S2 heard, RRR. No murmurs, rubs, gallops or clicks. Gastrointestinal system: Abdomen is nondistended, soft and nontender. Normal bowel sounds heard. Central nervous system: Alert and oriented. No focal neurological deficits. Extremities: No edema. No calf tenderness Skin: No cyanosis. No rashes Psychiatry: Judgement and insight appear normal. Mood & affect appropriate.     Data Reviewed: I have personally reviewed following labs and imaging studies  CBC:  Recent Labs Lab 04/20/16 2142 04/21/16 1136 04/22/16 0352 04/23/16 0357  WBC 16.0* 20.2* 19.0* 18.2*  HGB 13.9 13.3 12.8 12.0  HCT 42.1 40.5 39.3 37.5  MCV 83.4 82.8 83.4 83.5  PLT 384 345 298 [id number]   Basic Metabolic Panel:  Recent Labs Lab 04/20/16 2142 04/21/16 1136 04/22/16 0352 04/23/16 0357  NA 137  --  137 136  K 3.4*  --  3.5 3.3*  CL 99*  --  100* 100*  CO2 29  --  28 26  GLUCOSE 133*  --  107* 84  BUN 22*  --  12 11  CREATININE 1.09* 0.96 0.91 0.79  CALCIUM 9.1  --  8.3* 8.2*   GFR: Estimated Creatinine Clearance: 110.5 mL/min (by C-G formula based on SCr of 0.79 mg/dL). Liver Function Tests:  Recent Labs Lab 04/20/16 2142 04/22/16 0352  AST 26 17  ALT 25 19  ALKPHOS 116 93  BILITOT 0.7 1.5*  PROT 7.7 7.0  ALBUMIN 4.5 3.8    Recent Labs Lab 04/20/16 2142  LIPASE 29   No results for input(s): AMMONIA in the last 168 hours. Coagulation Profile: No  results for input(s): INR, PROTIME in the last 168 hours. Cardiac Enzymes: No results for input(s): CKTOTAL, CKMB, CKMBINDEX, TROPONINI in the last 168 hours. BNP (last 3 results) No results for input(s): PROBNP in the last 8760 hours. HbA1C: No results for input(s): HGBA1C in the last 72 hours. CBG: No results for input(s): GLUCAP in the last 168 hours. Lipid Profile: No results for input(s): CHOL, HDL, LDLCALC, TRIG, CHOLHDL, LDLDIRECT in the last 72 hours. Thyroid Function Tests: No results for input(s): TSH, T4TOTAL, FREET4, T3FREE, THYROIDAB in the last 72 hours. Anemia Panel: No results for input(s): VITAMINB12, FOLATE, FERRITIN, TIBC, IRON, RETICCTPCT in the last 72 hours. Sepsis Labs: No results for input(s): PROCALCITON, LATICACIDVEN in the last 168 hours.  No results found for this or any previous visit (from the past 240 hour(s)).       Radiology Studies: No results found.      Scheduled Meds: . busPIRone  15 mg Oral TID  . enoxaparin (LOVENOX) injection  40 mg Subcutaneous Q24H  . lamoTRIgine  400 mg Oral Daily  . loratadine  10 mg Oral Daily  . pantoprazole  40 mg Oral Daily  . piperacillin-tazobactam (ZOSYN)  IV  3.375 g Intravenous Q8H  . potassium chloride  20 mEq Oral BID  . Teriflunomide  14 mg Oral Daily   Continuous Infusions: . dextrose 5 % and 0.9 % NaCl with KCl 20 mEq/L 75 mL/hr at 04/22/16 1802     LOS: 2 days     Cordelia Poche [facility] 04/23/2016, 4:37 PM Pager: [phone]  If 7PM-7AM, please contact night-coverage [email or web address] Password [facility] 04/23/2016, 4:37 PM

## 2016-04-24 DIAGNOSIS — A419 Sepsis, unspecified organism: Principal | ICD-10-CM

## 2016-04-24 LAB — CBC
HCT: 36.8 % (ref 36.0–46.0)
Hemoglobin: 11.7 g/dL — ABNORMAL LOW (ref 12.0–15.0)
MCH: 27.1 pg (ref 26.0–34.0)
MCHC: 31.8 g/dL (ref 30.0–36.0)
MCV: 85.4 fL (ref 78.0–100.0)
Platelets: 313 10*3/uL (ref 150–400)
RBC: 4.31 MIL/uL (ref 3.87–5.11)
RDW: 15 % (ref 11.5–15.5)
WBC: 11 10*3/uL — AB (ref 4.0–10.5)

## 2016-04-24 MED ORDER — METRONIDAZOLE 500 MG PO TABS
500.0000 mg | ORAL_TABLET | Freq: Three times a day (TID) | ORAL | Status: DC
  Administered 2016-04-24 – 2016-04-25 (×2): 500 mg via ORAL
  Filled 2016-04-24 (×2): qty 1

## 2016-04-24 MED ORDER — SACCHAROMYCES BOULARDII 250 MG PO CAPS
250.0000 mg | ORAL_CAPSULE | Freq: Two times a day (BID) | ORAL | Status: DC
  Administered 2016-04-24 – 2016-04-25 (×3): 250 mg via ORAL
  Filled 2016-04-24 (×3): qty 1

## 2016-04-24 MED ORDER — CIPROFLOXACIN HCL 500 MG PO TABS
500.0000 mg | ORAL_TABLET | Freq: Two times a day (BID) | ORAL | Status: DC
  Administered 2016-04-24 – 2016-04-25 (×2): 500 mg via ORAL
  Filled 2016-04-24 (×2): qty 1

## 2016-04-24 NOTE — Progress Notes (Signed)
  Subjective: Slow improvement, no pain now but having some. Didn't take much PO yesterday.    Objective: Vital signs in last 24 hours: Temp:  [97.4 F (36.3 C)-98.8 F (37.1 C)] 98.8 F (37.1 C) (01/25 0502) Pulse Rate:  [95-107] 95 (01/25 0502) Resp:  [17-18] 18 (01/25 0502) BP: (123-141)/(64-88) 123/64 (01/25 0502) SpO2:  [98 %-100 %] 98 % (01/25 0502) Last BM Date: 04/23/16 PO 200 Voided x 3 BM x 1 Afebrile, Tachycardia a little better. BP Ok WBC coming down today  CT scan 02/19/17  Intake/Output from previous day: 01/24 0701 - 01/25 0700 In: 200 [P.O.:200] Out: -  Intake/Output this shift: No intake/output data recorded.  General appearance: alert, cooperative and no distress GI: soft, nontender now but still having some pain on and off.  Lab Results:   Recent Labs  04/23/16 0357 04/24/16 0732  WBC 18.2* 11.0*  HGB 12.0 11.7*  HCT 37.5 36.8  PLT 301 313    BMET  Recent Labs  04/22/16 0352 04/23/16 0357  NA 137 136  K 3.5 3.3*  CL 100* 100*  CO2 28 26  GLUCOSE 107* 84  BUN 12 11  CREATININE 0.91 0.79  CALCIUM 8.3* 8.2*   PT/INR No results for input(s): LABPROT, INR in the last 72 hours.   Recent Labs Lab 04/20/16 2142 04/22/16 0352  AST 26 17  ALT 25 19  ALKPHOS 116 93  BILITOT 0.7 1.5*  PROT 7.7 7.0  ALBUMIN 4.5 3.8     Lipase     Component Value Date/Time   LIPASE 29 04/20/2016 2142     Studies/Results: No results found.  Medications: . busPIRone  15 mg Oral TID  . enoxaparin (LOVENOX) injection  40 mg Subcutaneous Q24H  . lamoTRIgine  400 mg Oral Daily  . loratadine  10 mg Oral Daily  . pantoprazole  40 mg Oral Daily  . piperacillin-tazobactam (ZOSYN)  IV  3.375 g Intravenous Q8H  . potassium chloride  20 mEq Oral BID  . Teriflunomide  14 mg Oral Daily    Assessment/Plan Sigmoid diverticulitis with microperforation; inflammation left ovary secondary to diverticulitis Multiple sclerosis Hypertension FEN: IV  fluids/clears =>> fulls  ID: Zosyn 04/21/16 =>> day 4 DVT: Lovenox   I will increase her diet add probiotic.  If WBC and pain better aim for soft diet and discharge tomorrow.       LOS: 3 days    Fabian Walder 04/24/2016 [phone]

## 2016-04-24 NOTE — Progress Notes (Signed)
PROGRESS NOTE    Sandra Bonilla  [id number] DOB: Jun 09, 1975 DOA: 04/21/2016 PCP: Volanda Napoleon, MD   Brief Narrative: Sandra Bonilla is a 41 y.o. female with a history of hypertension multiple sclerosis presenting with one-day history of left lower quadrant abdominal pain with subjective fevers and chills. She had denied any nausea, vomiting, or diarrhea. CT imaging of the abdomen showed acute inflammation in the left pelvis abutting the sigmoid colon and left adnexa with a few foci of extraluminal air favoring acute diverticulitis. The patient was started on bowel rest, intravenous fluids, and intravenous antibiotics. General surgery was consulted to assist with management.   Assessment & Plan:   Active Problems:   Diverticulitis   Diverticulitis of large intestine with perforation without bleeding   Multiple sclerosis ([place])   Essential hypertension   Sepsis Secondary to diverticulitis. Resolved. -Discontinue IV fluids -continue antibiotics (transition to oral)  Sigmoid diverticulitis with microperforation Patient has associated inflammation of the left adnexa -Discontinue Zosyn -Start Ciprofloxacin and metronidazole -Discontinue fluids -Advance diet per surgery  Essential hypertension Controlled -Continue to hold chlorthalidone  Multiple sclerosis Stable -continue teriflunomide  Depression/anxiety -continue buspirone and lamictal   DVT prophylaxis: SCDs Code Status: Full code Family Communication: None at bedside Disposition Plan: Likely discharge home in 1-2 days if tolerating diet   Consultants:   General surgery  Procedures:   None  Antimicrobials:  Zosyn (1/22>>1/25  Ciprofloxacin (1/25>>  Metronidazole (1/25>>   Subjective: Pain improved. Somewhat tolerating oral food today. No nausea or vomiting. Mild pressure below umbilicus, however, improved from previous  Objective: Vitals:   04/23/16 1428 04/23/16 1600 04/23/16  2042 04/24/16 0502  BP: (!) 141/88  130/83 123/64  Pulse: (!) 107  95 95  Resp: 17  18 18   Temp: 97.4 F (36.3 C)  98.6 F (37 C) 98.8 F (37.1 C)  TempSrc: Oral  Oral Oral  SpO2: 100%  99% 98%  Weight:      Height:  5\' 8"  (1.727 m)     No intake or output data in the 24 hours ending 04/24/16 1444 Filed Weights   04/21/16 1300  Weight: 102.1 kg (225 lb 1.4 oz)    Examination:  General exam: Appears calm and comfortable Respiratory system: Clear to auscultation. Respiratory effort normal. Cardiovascular system: S1 & S2 heard, RRR. No murmurs, rubs, gallops or clicks. Gastrointestinal system: Abdomen is nondistended, soft and mildly tender below umbilicus. Normal bowel sounds heard. Central nervous system: Alert and oriented. No focal neurological deficits. Extremities: No edema. No calf tenderness Skin: No cyanosis. No rashes Psychiatry: Judgement and insight appear normal. Mood & affect appropriate.     Data Reviewed: I have personally reviewed following labs and imaging studies  CBC:  Recent Labs Lab 04/20/16 2142 04/21/16 1136 04/22/16 0352 04/23/16 0357 04/24/16 0732  WBC 16.0* 20.2* 19.0* 18.2* 11.0*  HGB 13.9 13.3 12.8 12.0 11.7*  HCT 42.1 40.5 39.3 37.5 36.8  MCV 83.4 82.8 83.4 83.5 85.4  PLT 384 345 298 301 [id number]   Basic Metabolic Panel:  Recent Labs Lab 04/20/16 2142 04/21/16 1136 04/22/16 0352 04/23/16 0357  NA 137  --  137 136  K 3.4*  --  3.5 3.3*  CL 99*  --  100* 100*  CO2 29  --  28 26  GLUCOSE 133*  --  107* 84  BUN 22*  --  12 11  CREATININE 1.09* 0.96 0.91 0.79  CALCIUM 9.1  --  8.3* 8.2*  GFR: Estimated Creatinine Clearance: 114.5 mL/min (by C-G formula based on SCr of 0.79 mg/dL). Liver Function Tests:  Recent Labs Lab 04/20/16 2142 04/22/16 0352  AST 26 17  ALT 25 19  ALKPHOS 116 93  BILITOT 0.7 1.5*  PROT 7.7 7.0  ALBUMIN 4.5 3.8    Recent Labs Lab 04/20/16 2142  LIPASE 29   No results for input(s): AMMONIA in  the last 168 hours. Coagulation Profile: No results for input(s): INR, PROTIME in the last 168 hours. Cardiac Enzymes: No results for input(s): CKTOTAL, CKMB, CKMBINDEX, TROPONINI in the last 168 hours. BNP (last 3 results) No results for input(s): PROBNP in the last 8760 hours. HbA1C: No results for input(s): HGBA1C in the last 72 hours. CBG: No results for input(s): GLUCAP in the last 168 hours. Lipid Profile: No results for input(s): CHOL, HDL, LDLCALC, TRIG, CHOLHDL, LDLDIRECT in the last 72 hours. Thyroid Function Tests: No results for input(s): TSH, T4TOTAL, FREET4, T3FREE, THYROIDAB in the last 72 hours. Anemia Panel: No results for input(s): VITAMINB12, FOLATE, FERRITIN, TIBC, IRON, RETICCTPCT in the last 72 hours. Sepsis Labs: No results for input(s): PROCALCITON, LATICACIDVEN in the last 168 hours.  No results found for this or any previous visit (from the past 240 hour(s)).       Radiology Studies: No results found.      Scheduled Meds: . busPIRone  15 mg Oral TID  . ciprofloxacin  500 mg Oral BID  . enoxaparin (LOVENOX) injection  40 mg Subcutaneous Q24H  . lamoTRIgine  400 mg Oral Daily  . loratadine  10 mg Oral Daily  . metroNIDAZOLE  500 mg Oral Q8H  . pantoprazole  40 mg Oral Daily  . potassium chloride  20 mEq Oral BID  . saccharomyces boulardii  250 mg Oral BID  . Teriflunomide  14 mg Oral Daily   Continuous Infusions: . dextrose 5 % and 0.9 % NaCl with KCl 20 mEq/L 75 mL/hr at 04/23/16 1807     LOS: 3 days     Cordelia Poche [facility] 04/24/2016, 2:44 PM Pager: [phone]  If 7PM-7AM, please contact night-coverage [email or web address] Password [facility] 04/24/2016, 2:44 PM

## 2016-04-25 LAB — BASIC METABOLIC PANEL
ANION GAP: 6 (ref 5–15)
BUN: 9 mg/dL (ref 6–20)
CALCIUM: 8.2 mg/dL — AB (ref 8.9–10.3)
CO2: 28 mmol/L (ref 22–32)
Chloride: 105 mmol/L (ref 101–111)
Creatinine, Ser: 0.85 mg/dL (ref 0.44–1.00)
GLUCOSE: 100 mg/dL — AB (ref 65–99)
Potassium: 3.8 mmol/L (ref 3.5–5.1)
SODIUM: 139 mmol/L (ref 135–145)

## 2016-04-25 LAB — CBC
HCT: 36.2 % (ref 36.0–46.0)
Hemoglobin: 11.5 g/dL — ABNORMAL LOW (ref 12.0–15.0)
MCH: 27.2 pg (ref 26.0–34.0)
MCHC: 31.8 g/dL (ref 30.0–36.0)
MCV: 85.6 fL (ref 78.0–100.0)
PLATELETS: 307 10*3/uL (ref 150–400)
RBC: 4.23 MIL/uL (ref 3.87–5.11)
RDW: 14.9 % (ref 11.5–15.5)
WBC: 9 10*3/uL (ref 4.0–10.5)

## 2016-04-25 MED ORDER — CIPROFLOXACIN HCL 500 MG PO TABS: 500.0000 mg | ORAL_TABLET | Freq: Two times a day (BID) | ORAL | 0 refills | Status: AC

## 2016-04-25 MED ORDER — CIPROFLOXACIN HCL 500 MG PO TABS: 500.0000 mg | ORAL_TABLET | Freq: Two times a day (BID) | ORAL | 0 refills | Status: DC

## 2016-04-25 MED ORDER — METRONIDAZOLE 500 MG PO TABS: 500.0000 mg | ORAL_TABLET | Freq: Three times a day (TID) | ORAL | 0 refills | Status: DC

## 2016-04-25 MED ORDER — METRONIDAZOLE 500 MG PO TABS: 500.0000 mg | ORAL_TABLET | Freq: Three times a day (TID) | ORAL | 0 refills | Status: AC

## 2016-04-25 NOTE — Progress Notes (Signed)
Patient's discharge paperwork gone over with patient, and questions answered.  Medications brought up from pharmacy and given to pt.  Patient take via wheelchair to her car where family drove her home.

## 2016-04-25 NOTE — Discharge Instructions (Signed)
Sandra Bonilla,  Your treated for diverticulitis with a small perforation. You treated with antibiotics for which she'll continue as an outpatient. Please continue to advance her diet slowly holding for pain. Please ensure to follow-up with your primary care physician in 1-2 weeks. If symptoms worsen, please return promptly for reevaluation.    Diverticulitis Diverticulitis is inflammation or infection of small pouches in your colon that form when you have a condition called diverticulosis. The pouches in your colon are called diverticula. Your colon, or large intestine, is where water is absorbed and stool is formed. Complications of diverticulitis can include:  Bleeding.  Severe infection.  Severe pain.  Perforation of your colon.  Obstruction of your colon. What are the causes? Diverticulitis is caused by bacteria. Diverticulitis happens when stool becomes trapped in diverticula. This allows bacteria to grow in the diverticula, which can lead to inflammation and infection. What increases the risk? People with diverticulosis are at risk for diverticulitis. Eating a diet that does not include enough fiber from fruits and vegetables may make diverticulitis more likely to develop. What are the signs or symptoms? Symptoms of diverticulitis may include:  Abdominal pain and tenderness. The pain is normally located on the left side of the abdomen, but may occur in other areas.  Fever and chills.  Bloating.  Cramping.  Nausea.  Vomiting.  Constipation.  Diarrhea.  Blood in your stool. How is this diagnosed? Your health care provider will ask you about your medical history and do a physical exam. You may need to have tests done because many medical conditions can cause the same symptoms as diverticulitis. Tests may include:  Blood tests.  Urine tests.  Imaging tests of the abdomen, including X-rays and CT scans. When your condition is under control, your health care  provider may recommend that you have a colonoscopy. A colonoscopy can show how severe your diverticula are and whether something else is causing your symptoms. How is this treated? Most cases of diverticulitis are mild and can be treated at home. Treatment may include:  Taking over-the-counter pain medicines.  Following a clear liquid diet.  Taking antibiotic medicines by mouth for 7-10 days. More severe cases may be treated at a hospital. Treatment may include:  Not eating or drinking.  Taking prescription pain medicine.  Receiving antibiotic medicines through an IV tube.  Receiving fluids and nutrition through an IV tube.  Surgery. Follow these instructions at home:  Follow your health care providers instructions carefully.  Follow a full liquid diet or other diet as directed by your health care provider. After your symptoms improve, your health care provider may tell you to change your diet. He or she may recommend you eat a high-fiber diet. Fruits and vegetables are good sources of fiber. Fiber makes it easier to pass stool.  Take fiber supplements or probiotics as directed by your health care provider.  Only take medicines as directed by your health care provider.  Keep all your follow-up appointments. Contact a health care provider if:  Your pain does not improve.  You have a hard time eating food.  Your bowel movements do not return to normal. Get help right away if:  Your pain becomes worse.  Your symptoms do not get better.  Your symptoms suddenly get worse.  You have a fever.  You have repeated vomiting.  You have bloody or black, tarry stools. This information is not intended to replace advice given to you by your health care provider. Make  sure you discuss any questions you have with your health care provider. Document Released: 12/25/2004 Document Revised: 08/23/2015 Document Reviewed: 02/09/2013 Elsevier Interactive Patient Education  2017 [employer].

## 2016-04-25 NOTE — Discharge Summary (Signed)
Physician Discharge Summary  Sandra Bonilla [id number] DOB: 09/11/1975 DOA: 04/21/2016  PCP: Volanda Napoleon, MD  Admit date: 04/21/2016 Discharge date: 04/25/2016  Admitted From: Home Disposition:  Home  Recommendations for Outpatient Follow-up:  1. Follow up with PCP in 1 week   Discharge Condition: Stable CODE STATUS: Full code   Brief/Interim Summary:  Chief Complaint: Abdominal pain.   HPI: Sandra Bonilla is a 41 y.o. female with medical history significant of MS, who presents with abdominal pain. Localized left lower quadrant, 10/10 in intensity,sharp and colic in nature, worse with po intake, improve with pain medications, associated with fever, no nausea, vomiting or diarrhea. Radiated to the right side.   No history of constipation, no diverticulitis episodes in the past. MS is well controlled.    ED Course: Diagnosed diverticulitis, with micro-perforations, surgery consulted, referred for admission.     Hospital course:  Sepsis Secondary to diverticulitis. Patient started on empiric antibiotics and given IV fluids. Sepsis resolved.  Sigmoid diverticulitis with microperforation Patient has associated inflammation of the left adnexa on imaging. Surgery consulted and patient medically managed. Symptoms improved with slow advancement of diet. Zosyn initially given as broad spectrum and transitioned to ciprofloxacin and metronidazole.  Essential hypertension Controlled without home medications. Restart chlorthalidone at discharge  Multiple sclerosis Stable. Continued teriflunomide  Depression/anxiety -continue buspirone and lamictal  Discharge Diagnoses:  Active Problems:   Diverticulitis   Diverticulitis of large intestine with perforation without bleeding   Multiple sclerosis ([place])   Essential hypertension    Discharge Instructions   Allergies as of 04/25/2016      Reactions   Septra [sulfamethoxazole-trimethoprim] Rash       Medication List    STOP taking these medications   doxepin 25 MG capsule Commonly known as:  SINEQUAN   HYDROcodone-acetaminophen 5-325 MG tablet Commonly known as:  NORCO/VICODIN   imipramine 25 MG tablet Commonly known as:  TOFRANIL   traZODone 100 MG tablet Commonly known as:  DESYREL     TAKE these medications   busPIRone 15 MG tablet Commonly known as:  BUSPAR Take 15 mg by mouth 3 (three) times daily.   chlorthalidone 25 MG tablet Commonly known as:  HYGROTON Take 12.5 mg by mouth daily.   ciprofloxacin 500 MG tablet Commonly known as:  CIPRO Take 1 tablet (500 mg total) by mouth 2 (two) times daily.   lamoTRIgine 200 MG tablet Commonly known as:  LAMICTAL Take 400 mg by mouth daily.   loratadine 10 MG tablet Commonly known as:  CLARITIN Take 10 mg by mouth daily.   metroNIDAZOLE 500 MG tablet Commonly known as:  FLAGYL Take 1 tablet (500 mg total) by mouth 3 (three) times daily.   REFRESH OP Place 2 drops into both eyes 4 (four) times daily as needed (dryness).   Teriflunomide 14 MG Tabs Commonly known as:  AUBAGIO Take 14 mg by mouth daily.      Follow-up Information    TEJAN-SIE, Clotilde Dieter, MD. Schedule an appointment as soon as possible for a visit in 1 week(s).   Specialty:  Internal Medicine Contact information: [place] [address] [phone]          Allergies  Allergen Reactions  . Septra [Sulfamethoxazole-Trimethoprim] Rash    Consultations:  General surgery   Procedures/Studies: Ct Renal Stone Study  Result Date: 04/21/2016 CLINICAL DATA:  Lower abdominal pain and dysuria. History of multiple sclerosis. EXAM: CT ABDOMEN AND PELVIS WITHOUT CONTRAST TECHNIQUE: Multidetector CT  imaging of the abdomen and pelvis was performed following the standard protocol without IV contrast. COMPARISON:  12/13/2013 FINDINGS: Lower chest: No acute abnormality. Hepatobiliary: No focal liver abnormality is seen. Status  post cholecystectomy. No biliary dilatation. Pancreas: Unremarkable. No pancreatic ductal dilatation or surrounding inflammatory changes. Spleen: Normal in size without focal abnormality. Adrenals/Urinary Tract: Stable 2 cm low-density adenoma of the left adrenal gland. The right adrenal gland is normal. Both kidneys show no hydronephrosis. There is a tiny nonobstructing calculus in the mid left kidney. No ureteral or bladder abnormalities. Stomach/Bowel: There is an acute inflammatory process in the left pelvis abutting the sigmoid colon and left adnexal region. Based on the presence of diverticula of the sigmoid colon as well as a few foci of extraluminal air in this region, acute diverticulitis is favored. There does appear to be secondary inflammation of the left ovary/ adnexal region. No focal abscess is identified. No significant free intraperitoneal air. Other bowel is unremarkable. The appendix is normal. Vascular/Lymphatic: No enlarged lymph nodes are identified. The abdominal aorta is of normal caliber. No calcified atherosclerotic plaque identified. Reproductive: As above, there is secondary inflammation of the left ovary, likely secondary to acute diverticulitis. Tubo-ovarian abscess is felt less likely. Other: No hernias identified. Musculoskeletal: No acute or significant osseous findings. IMPRESSION: 1. Acute inflammatory process in the left pelvis abutting the sigmoid colon and left adnexal region. Based on the presence of diverticulae of the sigmoid colon as well as some small foci of extra luminal air, acute diverticulitis is favored. No associated focal abscess or bowel obstruction. Tubo-ovarian abscess on the left is felt to be less likely. 2. Tiny nonobstructing left renal calculus. 3. Stable 2 cm left adrenal gland benign and adenoma. Electronically Signed   By: Aletta Edouard M.D.   On: 04/21/2016 08:10     Subjective: No pain. No nausea or vomiting.  Discharge Exam: Vitals:    04/24/16 2151 04/25/16 0437  BP: 120/68 114/71  Pulse: 98 93  Resp: 16 16  Temp: 97.8 F (36.6 C) 97.6 F (36.4 C)   Vitals:   04/24/16 0502 04/24/16 1505 04/24/16 2151 04/25/16 0437  BP: 123/64 (!) 134/91 120/68 114/71  Pulse: 95 100 98 93  Resp: 18 18 16 16   Temp: 98.8 F (37.1 C) 97.5 F (36.4 C) 97.8 F (36.6 C) 97.6 F (36.4 C)  TempSrc: Oral Oral Oral Oral  SpO2: 98% 100% 92% 96%  Weight:      Height:        General exam: Appears calm and comfortable Respiratory system: Clear to auscultation. Respiratory effort normal. Cardiovascular system: S1 & S2 heard, RRR. No murmurs, rubs, gallops or clicks. Gastrointestinal system: Abdomen is nondistended, soft and non-tender. Normal bowel sounds heard. Central nervous system: Alert and oriented. No focal neurological deficits. Extremities: No edema. No calf tenderness Skin: No cyanosis. No rashes Psychiatry: Judgement and insight appear normal. Mood & affect appropriate.    The results of significant diagnostics from this hospitalization (including imaging, microbiology, ancillary and laboratory) are listed below for reference.     Microbiology: No results found for this or any previous visit (from the past 240 hour(s)).   Labs: BNP (last 3 results) No results for input(s): BNP in the last 8760 hours. Basic Metabolic Panel:  Recent Labs Lab 04/20/16 2142 04/21/16 1136 04/22/16 0352 04/23/16 0357 04/25/16 0416  NA 137  --  137 136 139  K 3.4*  --  3.5 3.3* 3.8  CL 99*  --  100* 100* 105  CO2 29  --  28 26 28   GLUCOSE 133*  --  107* 84 100*  BUN 22*  --  12 11 9   CREATININE 1.09* 0.96 0.91 0.79 0.85  CALCIUM 9.1  --  8.3* 8.2* 8.2*   Liver Function Tests:  Recent Labs Lab 04/20/16 2142 04/22/16 0352  AST 26 17  ALT 25 19  ALKPHOS 116 93  BILITOT 0.7 1.5*  PROT 7.7 7.0  ALBUMIN 4.5 3.8    Recent Labs Lab 04/20/16 2142  LIPASE 29   No results for input(s): AMMONIA in the last 168  hours. CBC:  Recent Labs Lab 04/21/16 1136 04/22/16 0352 04/23/16 0357 04/24/16 0732 04/25/16 0416  WBC 20.2* 19.0* 18.2* 11.0* 9.0  HGB 13.3 12.8 12.0 11.7* 11.5*  HCT 40.5 39.3 37.5 36.8 36.2  MCV 82.8 83.4 83.5 85.4 85.6  PLT 345 298 301 313 307   Cardiac Enzymes: No results for input(s): CKTOTAL, CKMB, CKMBINDEX, TROPONINI in the last 168 hours. BNP: Invalid input(s): POCBNP CBG: No results for input(s): GLUCAP in the last 168 hours. D-Dimer No results for input(s): DDIMER in the last 72 hours. Hgb A1c No results for input(s): HGBA1C in the last 72 hours. Lipid Profile No results for input(s): CHOL, HDL, LDLCALC, TRIG, CHOLHDL, LDLDIRECT in the last 72 hours. Thyroid function studies No results for input(s): TSH, T4TOTAL, T3FREE, THYROIDAB in the last 72 hours.  Invalid input(s): FREET3 Anemia work up No results for input(s): VITAMINB12, FOLATE, FERRITIN, TIBC, IRON, RETICCTPCT in the last 72 hours. Urinalysis    Component Value Date/Time   COLORURINE YELLOW 04/21/2016 0425   APPEARANCEUR CLEAR 04/21/2016 0425   LABSPEC 1.029 04/21/2016 0425   PHURINE 5.0 04/21/2016 0425   GLUCOSEU NEGATIVE 04/21/2016 0425   HGBUR NEGATIVE 04/21/2016 0425   BILIRUBINUR NEGATIVE 04/21/2016 0425   KETONESUR 5 (A) 04/21/2016 0425   PROTEINUR NEGATIVE 04/21/2016 0425   UROBILINOGEN 0.2 12/12/2013 2356   NITRITE NEGATIVE 04/21/2016 0425   LEUKOCYTESUR NEGATIVE 04/21/2016 0425   Sepsis Labs Invalid input(s): PROCALCITONIN,  WBC,  LACTICIDVEN Microbiology No results found for this or any previous visit (from the past 240 hour(s)).   Time coordinating discharge: Over 30 minutes  SIGNED:   Cordelia Poche, MD [facility] 04/25/2016, 11:24 AM Pager [phone]  If 7PM-7AM, please contact night-coverage [email or web address] Password [email or web address]

## 2016-04-25 NOTE — Progress Notes (Signed)
  Subjective: She looks good, no pain tolerating diet, up to soft. No pain.  Objective: Vital signs in last 24 hours: Temp:  [97.5 F (36.4 C)-97.8 F (36.6 C)] 97.6 F (36.4 C) (01/26 0437) Pulse Rate:  [93-100] 93 (01/26 0437) Resp:  [16-18] 16 (01/26 0437) BP: (114-134)/(68-91) 114/71 (01/26 0437) SpO2:  [92 %-100 %] 96 % (01/26 0437) Last BM Date: 04/24/16 1020 PO 900IV Voided x 5 Stool x 3 Afebrile, VSS WBC is normal  Intake/Output from previous day: 01/25 0701 - 01/26 0700 In: 1928.8 [P.O.:1020; I.V.:908.8] Out: -  Intake/Output this shift: No intake/output data recorded.  General appearance: alert, cooperative and no distress GI: soft, non-tender; bowel sounds normal; no masses,  no organomegaly  Lab Results:   Recent Labs  04/24/16 0732 04/25/16 0416  WBC 11.0* 9.0  HGB 11.7* 11.5*  HCT 36.8 36.2  PLT 313 307    BMET  Recent Labs  04/23/16 0357 04/25/16 0416  NA 136 139  K 3.3* 3.8  CL 100* 105  CO2 26 28  GLUCOSE 84 100*  BUN 11 9  CREATININE 0.79 0.85  CALCIUM 8.2* 8.2*   PT/INR No results for input(s): LABPROT, INR in the last 72 hours.   Recent Labs Lab 04/20/16 2142 04/22/16 0352  AST 26 17  ALT 25 19  ALKPHOS 116 93  BILITOT 0.7 1.5*  PROT 7.7 7.0  ALBUMIN 4.5 3.8     Lipase     Component Value Date/Time   LIPASE 29 04/20/2016 2142     Studies/Results: No results found.  Medications: . busPIRone  15 mg Oral TID  . ciprofloxacin  500 mg Oral BID  . enoxaparin (LOVENOX) injection  40 mg Subcutaneous Q24H  . lamoTRIgine  400 mg Oral Daily  . loratadine  10 mg Oral Daily  . metroNIDAZOLE  500 mg Oral Q8H  . pantoprazole  40 mg Oral Daily  . potassium chloride  20 mEq Oral BID  . saccharomyces boulardii  250 mg Oral BID  . Teriflunomide  14 mg Oral Daily    Assessment/Plan Sigmoid diverticulitis with microperforation; inflammation left ovary secondary to diverticulitis Multiple sclerosis Hypertension FEN:  IV fluids/clears =>> fulls  ID: Zosyn 04/21/16 =>>day 4 started on Cipro/Flagyl yesterday DVT: Lovenox  Plan:  From our standpoint she can go, complete 14 day course of antibiotics.  She has follow up with PCP next week.  Call if we can be of further assistance.       LOS: 4 days    Roselynne Lortz 04/25/2016 [phone]

## 2016-06-03 ENCOUNTER — Ambulatory Visit
Admission: RE | Admit: 2016-06-03 | Discharge: 2016-06-03 | Disposition: A | Discharge status: Home / Self Care | Payer: 59 | Source: Ambulatory Visit | Attending: Obstetrics and Gynecology | Admitting: Obstetrics and Gynecology

## 2016-06-03 DIAGNOSIS — Z1231 Encounter for screening mammogram for malignant neoplasm of breast: Secondary | ICD-10-CM | POA: Diagnosis not present

## 2016-06-04 ENCOUNTER — Encounter: Payer: Self-pay | Admitting: Obstetrics and Gynecology

## 2016-06-04 NOTE — Progress Notes (Signed)
Letter mailed to patient.

## 2016-06-11 ENCOUNTER — Other Ambulatory Visit: Payer: Self-pay | Admitting: General Surgery

## 2016-06-17 ENCOUNTER — Emergency Department (HOSPITAL_COMMUNITY): Payer: 59

## 2016-06-17 ENCOUNTER — Telehealth: Payer: Self-pay | Admitting: *Deleted

## 2016-06-17 ENCOUNTER — Encounter (HOSPITAL_COMMUNITY): Payer: Self-pay | Admitting: Radiology

## 2016-06-17 ENCOUNTER — Emergency Department (HOSPITAL_COMMUNITY)
Admission: EM | Admit: 2016-06-17 | Discharge: 2016-06-17 | Disposition: A | Discharge status: Home / Self Care | Payer: 59 | Attending: Emergency Medicine | Admitting: Emergency Medicine

## 2016-06-17 ENCOUNTER — Telehealth: Payer: Self-pay | Admitting: Neurology

## 2016-06-17 DIAGNOSIS — I10 Essential (primary) hypertension: Secondary | ICD-10-CM | POA: Diagnosis not present

## 2016-06-17 DIAGNOSIS — S0990XA Unspecified injury of head, initial encounter: Secondary | ICD-10-CM

## 2016-06-17 DIAGNOSIS — W0110XA Fall on same level from slipping, tripping and stumbling with subsequent striking against unspecified object, initial encounter: Secondary | ICD-10-CM | POA: Insufficient documentation

## 2016-06-17 DIAGNOSIS — W19XXXA Unspecified fall, initial encounter: Secondary | ICD-10-CM

## 2016-06-17 DIAGNOSIS — S0101XA Laceration without foreign body of scalp, initial encounter: Secondary | ICD-10-CM | POA: Insufficient documentation

## 2016-06-17 DIAGNOSIS — Z87891 Personal history of nicotine dependence: Secondary | ICD-10-CM | POA: Insufficient documentation

## 2016-06-17 DIAGNOSIS — Y999 Unspecified external cause status: Secondary | ICD-10-CM | POA: Insufficient documentation

## 2016-06-17 DIAGNOSIS — Y929 Unspecified place or not applicable: Secondary | ICD-10-CM | POA: Diagnosis not present

## 2016-06-17 DIAGNOSIS — Z79899 Other long term (current) drug therapy: Secondary | ICD-10-CM | POA: Insufficient documentation

## 2016-06-17 DIAGNOSIS — Y939 Activity, unspecified: Secondary | ICD-10-CM | POA: Diagnosis not present

## 2016-06-17 DIAGNOSIS — Z23 Encounter for immunization: Secondary | ICD-10-CM | POA: Diagnosis not present

## 2016-06-17 MED ORDER — BACITRACIN ZINC 500 UNIT/GM EX OINT
TOPICAL_OINTMENT | Freq: Once | CUTANEOUS | Status: AC
  Administered 2016-06-17: 1 via TOPICAL
  Filled 2016-06-17: qty 0.9

## 2016-06-17 MED ORDER — TETANUS-DIPHTH-ACELL PERTUSSIS 5-2.5-18.5 LF-MCG/0.5 IM SUSP
0.5000 mL | Freq: Once | INTRAMUSCULAR | Status: AC
  Administered 2016-06-17: 0.5 mL via INTRAMUSCULAR
  Filled 2016-06-17: qty 0.5

## 2016-06-17 NOTE — ED Provider Notes (Signed)
[facility] DEPT Provider Note   CSN: [id number] Arrival date & time: 06/17/16  1242  By signing my name below, I, Sonum Patel, attest that this documentation has been prepared under the direction and in the presence of Doristine Devoid, PA-C. Electronically Signed: Sonum Patel, Scribe. 06/17/16. 1:56 PM.  History   Chief Complaint Chief Complaint  Patient presents with  . Fall  . Head Laceration    The history is provided by the patient. No language interpreter was used.     HPI Comments: Sandra Bonilla is a 41 y.o. female who presents to the Emergency Department complaining of a fall that occurred yesterday at 5:30pm. Patient states she had her hands full when she fell backwards down 3-4 steps, striking her posterior right head and back on the concrete ground. She states initially she had bleeding from the head injury which subsided but Started to rebleed last night then stopped again. She reports calling her PCP's office today who advised she be evaluated in the ED. She currently complains of a HA patient complains of pain to the area of contusion that is very minimal with an associated knot to the right posterior head along with back pain and bruising. She has a history of MS for about 15 years and states she has fallen as a result of this very few times though she states she does sometimes have to catch herself on walls. She does not feel that this episode is related to an MS flare. She takes Aubagio for her MS. She denies bowel/bladder incontinence, saddle anesthesia, nausea, vomiting, photophobia, vision changes, dizziness, lightheadedness. Denies blood thinner use.  Past Medical History:  Diagnosis Date  . Hypertension   . MS (multiple sclerosis) [facility])     Patient Active Problem List   Diagnosis Date Noted  . Essential hypertension 04/22/2016  . Diverticulitis of large intestine with perforation without bleeding   . Multiple sclerosis ([place])   . Diverticulitis  04/21/2016  . Other headache syndrome 02/05/2016  . Vitamin D deficiency 10/09/2015  . Other fatigue 10/09/2015  . Dysesthesia 04/11/2015  . Depression with anxiety 04/11/2015  . Gait disturbance 12/13/2014  . Insomnia 12/13/2014  . Multiple sclerosis, relapsing-remitting ([place]) 08/30/2012  . Relapsing remitting multiple sclerosis ([place]) 08/30/2012    Past Surgical History:  Procedure Laterality Date  . BREAST BIOPSY Right 2016   Fibroadenoma  . CHOLECYSTECTOMY    . DILATION AND CURETTAGE, DIAGNOSTIC / THERAPEUTIC      OB History    No data available       Home Medications    Prior to Admission medications   Medication Sig Start Date End Date Taking? Authorizing Provider  busPIRone (BUSPAR) 15 MG tablet Take 15 mg by mouth 3 (three) times daily.     Historical Provider, MD  chlorthalidone (HYGROTON) 25 MG tablet Take 12.5 mg by mouth daily.    Historical Provider, MD  lamoTRIgine (LAMICTAL) 200 MG tablet Take 400 mg by mouth daily.     Historical Provider, MD  loratadine (CLARITIN) 10 MG tablet Take 10 mg by mouth daily.    Historical Provider, MD  Polyvinyl Alcohol-Povidone (REFRESH OP) Place 2 drops into both eyes 4 (four) times daily as needed (dryness).    Historical Provider, MD  Teriflunomide (AUBAGIO) 14 MG TABS Take 14 mg by mouth daily. 03/08/15   Britt Bottom, MD    Family History Family History  Problem Relation Age of Onset  . Breast cancer Maternal Aunt 78  Social History Social History  Substance Use Topics  . Smoking status: Former Research scientist (life sciences)  . Smokeless tobacco: Never Used  . Alcohol use No     Allergies   Septra [sulfamethoxazole-trimethoprim]   Review of Systems Review of Systems  Eyes: Negative for photophobia and visual disturbance.  Respiratory: Negative for shortness of breath.   Cardiovascular: Negative for chest pain.  Gastrointestinal: Negative for abdominal pain, nausea and vomiting.  Musculoskeletal: Positive for back pain.    Skin: Positive for wound.  Neurological: Positive for headaches. Negative for dizziness, syncope, light-headedness and numbness.  All other systems reviewed and are negative.    Physical Exam Updated Vital Signs BP (!) 142/66 (BP Location: Right Arm)   Pulse 93   Temp 98.1 F (36.7 C) (Oral)   Resp 18   Ht 5\' 5"  (1.651 m)   Wt 96.2 kg   SpO2 95%   BMI 35.28 kg/m   Physical Exam  Constitutional: She is oriented to person, place, and time. She appears well-developed and well-nourished. No distress.  HENT:  Head: Normocephalic.  Right Ear: External ear normal.  Left Ear: External ear normal.  Mouth/Throat: Oropharynx is clear and moist.  Patient with small scabbed wound to the right occiput. No bleeding noted. No signs of infection. Approximately 0.5 cm in diameter. No appreciable hematoma noted. Patient is mildly tender to the area.  No bilateral hemotympanum. No septal hematoma.  Eyes: Conjunctivae and EOM are normal. Pupils are equal, round, and reactive to light.  Neck: Normal range of motion. Neck supple. No tracheal deviation present.  No midline C-spine tenderness. No deformities or step-offs noted.  Cardiovascular: Normal rate, regular rhythm, normal heart sounds and intact distal pulses.   Pulmonary/Chest: Effort normal and breath sounds normal. No respiratory distress. She has no wheezes.  Abdominal: Soft. There is no tenderness.  Musculoskeletal:  Mild midline L-spine tenderness to palpation without any deformity or step-offs noted. No T-spine midline tenderness. No paraspinal tenderness.. No deformity or step-offs noted. Full range of motion.  Neurological: She is alert and oriented to person, place, and time.  The patient is alert, attentive, and oriented x 3. Speech is clear. Cranial nerve II-VII grossly intact. Negative pronator drift. Sensation intact. Strength 5/5 in all extremities. Reflexes 2+ and symmetric at biceps, triceps, knees, and ankles. Rapid  alternating movement and fine finger movements intact. Romberg is absent. Posture and gait normal.   Skin: Skin is warm and dry. Capillary refill takes less than 2 seconds.  Psychiatric: She has a normal mood and affect.  Nursing note and vitals reviewed.    ED Treatments / Results  DIAGNOSTIC STUDIES: Oxygen Saturation is 99% on RA, normal by my interpretation.    COORDINATION OF CARE: 1:55 PM Discussed treatment plan with pt at bedside and pt agreed to plan.   Labs (all labs ordered are listed, but only abnormal results are displayed) Labs Reviewed - No data to display  EKG  EKG Interpretation None       Radiology Dg Thoracic Spine 2 View  Result Date: 06/17/2016 CLINICAL DATA:  Fall on steps yesterday. EXAM: THORACIC SPINE 2 VIEWS COMPARISON:  None. FINDINGS: There is no evidence of thoracic spine fracture. Alignment is normal. No other significant bone abnormalities are identified. IMPRESSION: Normal thoracic spine. Electronically Signed   By: Marijo Conception, M.D.   On: 06/17/2016 14:51   Dg Lumbar Spine Complete  Result Date: 06/17/2016 CLINICAL DATA:  Fall on steps yesterday. EXAM: LUMBAR SPINE -  COMPLETE 4+ VIEW COMPARISON:  None. FINDINGS: No fracture is noted. Stable grade 1 retrolisthesis of L3-4 is noted secondary to severe degenerative disc disease at this level. Remaining disc spaces appear intact. Posterior facet joints are unremarkable. IMPRESSION: Severe degenerative disc disease is noted at L3-4. No acute abnormality seen in the lumbar spine. Electronically Signed   By: Marijo Conception, M.D.   On: 06/17/2016 14:54   Ct Head Wo Contrast  Result Date: 06/17/2016 CLINICAL DATA:  Pain following fall.  History of multiple sclerosis EXAM: CT HEAD WITHOUT CONTRAST CT CERVICAL SPINE WITHOUT CONTRAST TECHNIQUE: Multidetector CT imaging of the head and cervical spine was performed following the standard protocol without intravenous contrast. Multiplanar CT image  reconstructions of the cervical spine were also generated. COMPARISON:  MR brain and MR cervical spine Aug 16, 2004. FINDINGS: CT HEAD FINDINGS Brain: The ventricles are normal in size and configuration. There is no intracranial mass, hemorrhage, extra-axial fluid collection, or midline shift. Areas of decreased attenuation in the supratentorial white matter of the centra semiovale, primarily on the left, most likely either due to known demyelination, documented on prior MR. elsewhere gray-white compartments appear normal. There are no findings suggesting acute infarct. Vascular: No hyperdense vessel.  No abnormal vascular calcification. Skull: Bony calvarium appears intact. Sinuses/Orbits: There is slight mucosal thickening in several ethmoid air cells. Other paranasal sinuses which are visualized are clear. Orbits appear symmetric bilaterally. Other: Mastoid air cells are clear. CT CERVICAL SPINE FINDINGS Alignment: There is no apparent spondylolisthesis. Skull base and vertebrae: Skull base and craniocervical junction regions appear normal. No fracture. No blastic or lytic bone lesions. Soft tissues and spinal canal: Prevertebral soft tissues and predental space regions are normal. No cord canal hematoma evident. No paraspinous lesions are evident. No spinal stenosis appreciable. Disc levels: Disc spaces appear normal. There is no evident nerve root edema or effacement. No disc extrusion. Upper chest: Visualized lung apices are clear. Other: There is a mass in the right lobe of the thyroid measuring 9 x 5 mm. Thyroid otherwise appears unremarkable. IMPRESSION: CT head: Rather subtle areas of decreased attenuation on this noncontrast enhanced CT likely are due to demyelination based on prior MR findings. It should be noted that noncontrast enhanced CT tends to underestimate significantly the amount of demyelination present. If assessment for demyelination is of clinical concern at this time, MR pre and  post-contrast would be the study of choice for further assessment. There is no intracranial mass, hemorrhage, or extra-axial fluid collection. No acute appearing gray-white compartment lesions are identified. No fracture evident. CT cervical spine: No fracture or spondylolisthesis. No evident arthropathy. subcentimeter mass in right lobe of thyroid. This mass does not warrant further imaging assessment at this time per consensus guidelines. Electronically Signed   By: Lowella Grip III M.D.   On: 06/17/2016 15:15   Ct Cervical Spine Wo Contrast  Result Date: 06/17/2016 CLINICAL DATA:  Pain following fall.  History of multiple sclerosis EXAM: CT HEAD WITHOUT CONTRAST CT CERVICAL SPINE WITHOUT CONTRAST TECHNIQUE: Multidetector CT imaging of the head and cervical spine was performed following the standard protocol without intravenous contrast. Multiplanar CT image reconstructions of the cervical spine were also generated. COMPARISON:  MR brain and MR cervical spine Aug 16, 2004. FINDINGS: CT HEAD FINDINGS Brain: The ventricles are normal in size and configuration. There is no intracranial mass, hemorrhage, extra-axial fluid collection, or midline shift. Areas of decreased attenuation in the supratentorial white matter of the centra semiovale, primarily  on the left, most likely either due to known demyelination, documented on prior MR. elsewhere gray-white compartments appear normal. There are no findings suggesting acute infarct. Vascular: No hyperdense vessel.  No abnormal vascular calcification. Skull: Bony calvarium appears intact. Sinuses/Orbits: There is slight mucosal thickening in several ethmoid air cells. Other paranasal sinuses which are visualized are clear. Orbits appear symmetric bilaterally. Other: Mastoid air cells are clear. CT CERVICAL SPINE FINDINGS Alignment: There is no apparent spondylolisthesis. Skull base and vertebrae: Skull base and craniocervical junction regions appear normal. No  fracture. No blastic or lytic bone lesions. Soft tissues and spinal canal: Prevertebral soft tissues and predental space regions are normal. No cord canal hematoma evident. No paraspinous lesions are evident. No spinal stenosis appreciable. Disc levels: Disc spaces appear normal. There is no evident nerve root edema or effacement. No disc extrusion. Upper chest: Visualized lung apices are clear. Other: There is a mass in the right lobe of the thyroid measuring 9 x 5 mm. Thyroid otherwise appears unremarkable. IMPRESSION: CT head: Rather subtle areas of decreased attenuation on this noncontrast enhanced CT likely are due to demyelination based on prior MR findings. It should be noted that noncontrast enhanced CT tends to underestimate significantly the amount of demyelination present. If assessment for demyelination is of clinical concern at this time, MR pre and post-contrast would be the study of choice for further assessment. There is no intracranial mass, hemorrhage, or extra-axial fluid collection. No acute appearing gray-white compartment lesions are identified. No fracture evident. CT cervical spine: No fracture or spondylolisthesis. No evident arthropathy. subcentimeter mass in right lobe of thyroid. This mass does not warrant further imaging assessment at this time per consensus guidelines. Electronically Signed   By: Lowella Grip III M.D.   On: 06/17/2016 15:15    Procedures Procedures (including critical care time)  Medications Ordered in ED Medications  bacitracin ointment (1 application Topical Given 06/17/16 1512)  Tdap (BOOSTRIX) injection 0.5 mL (0.5 mLs Intramuscular Given 06/17/16 1512)     Initial Impression / Assessment and Plan / ED Course  I have reviewed the triage vital signs and the nursing notes.  Pertinent labs & imaging results that were available during my care of the patient were reviewed by me and considered in my medical decision making (see chart for details).       Patient presents to the ED with complaint of a mechanical fall while carrying food up stairs yesterday evening. States she fell down 2-3 steps striking her right occiput on the concrete. Denies LOC. No focal neuro deficits. Wound noted to the right occiput that is scabbed over without any signs of infection or bleeding. Mild tenderness to palpation. Do not feel that suturing is appropriate this time. Bacitracin ointment applied and patient encouraged to watch for signs of infection. CT scans of head and cervical spine revealed no acute abnormalities. She does have likely areas of demyelination which is known due to patient's MS. Do not feel that this was due to an acute MS flare. X-rays of spine revealed no acute abnormalities.she is not tachycardic or hypotensive concerning for acute blood loss. She denies any associated symptoms of lightheadedness, dizziness, headache, vision changes. Spoke with the nurse for patient's neurologist who will  Schedule patient for an office visit this week. I have given patient strict return precautions. She relates understanding the plan of care and all questions were answered prior to discharge. She felt stable for discharge. Vital signs are within normal limits. She is  hemodynamically stable.  Final Clinical Impressions(s) / ED Diagnoses   Final diagnoses:  Fall, initial encounter  Injury of head, initial encounter  Laceration of scalp, initial encounter    New Prescriptions Discharge Medication List as of 06/17/2016  4:41 PM     I personally performed the services described in this documentation, which was scribed in my presence. The recorded information has been reviewed and is accurate.    Doristine Devoid, PA-C 06/18/16 [place], MD 06/18/16 [phone]

## 2016-06-17 NOTE — Telephone Encounter (Signed)
Received phone call from Sandra Bonilla--sts. he has evaluated Sandra Bonilla--she does have a laceration but it is scabbed over.  He imaged her neck and back, and no traumatic injuries noted.  Pt. does feel some off balance, slight h/a.  Appt. given next week with RAS for recheck/fim

## 2016-06-17 NOTE — ED Notes (Signed)
Pt ambulatory and independent at discharge.  Verbalized understanding of discharge instructions 

## 2016-06-17 NOTE — Discharge Instructions (Signed)
All other imaging has been normal. Please keep the wound on her head clean and apply antibiotic ointment. Watch for signs of infection including worsening redness, purulent drainage, warmth, fever, nausea, vomiting. If you  developa ny signs of concussion including headache, vision changes, nausea, light sensitivity please return to the ED or follow-up with her neurologist as soon as possible. He did have an appointment next Wednesday at 845 with your neurologist. Return to the ED as needed.

## 2016-06-17 NOTE — Telephone Encounter (Signed)
Pt called to say that on yesterday she fell and hit her head on bricks and there was blood. I asked if she wanted to schedule an appointment, she said she would like to hear from you first on what you would suggest.

## 2016-06-17 NOTE — Telephone Encounter (Signed)
I have spoken with Sandra Bonilla this morning.  She sts. around 1730 yesterday, she fell down 4 brick steps, hitting right occipital region on brick. No LOC, but did have a head wound that bled for about 2 hours, then started bleeding again later last night.  She did not seek med. attention at that time.  Is alert and oriented times 4 today. Does feel some off balance today. I have advised tx. at Urgent Care this am to address head wound, and CT or MRI if they feel it is needed, then will w/I with RAS if needed.  She is agreeable with this plan/fim

## 2016-06-17 NOTE — ED Triage Notes (Signed)
Pt states she was walking up steps from garage into the house carrying ice cream sundays.  Per her father who witnessed the fall, she came up to the top and lost her balance falling backwards and down the 3-4 steps, hitting back and her head on concrete.  Denies LOC.  Pt states she is unsure what caused her to fall but knows she didn't have syncope and her hands were full.  Decided not to come in last night b/c of the long ER waits.  States the lac stopped bleeding last night and when she awoke this am called her neurologist (sees for hx of MS) who told her to come to the ER for evaluation.  Currently pt states her head hurts where the bump is as well as well as her mid back, but has no other complaints.

## 2016-06-23 ENCOUNTER — Other Ambulatory Visit: Payer: Self-pay | Admitting: General Surgery

## 2016-06-25 ENCOUNTER — Ambulatory Visit (INDEPENDENT_AMBULATORY_CARE_PROVIDER_SITE_OTHER): Payer: 59 | Admitting: Neurology

## 2016-06-25 ENCOUNTER — Encounter: Payer: Self-pay | Admitting: Neurology

## 2016-06-25 VITALS — BP 114/80 | HR 76 | Resp 6 | Ht 65.0 in | Wt 213.0 lb

## 2016-06-25 DIAGNOSIS — E559 Vitamin D deficiency, unspecified: Secondary | ICD-10-CM

## 2016-06-25 DIAGNOSIS — F418 Other specified anxiety disorders: Secondary | ICD-10-CM

## 2016-06-25 DIAGNOSIS — R5383 Other fatigue: Secondary | ICD-10-CM

## 2016-06-25 DIAGNOSIS — S0990XA Unspecified injury of head, initial encounter: Secondary | ICD-10-CM | POA: Diagnosis not present

## 2016-06-25 DIAGNOSIS — G4700 Insomnia, unspecified: Secondary | ICD-10-CM

## 2016-06-25 DIAGNOSIS — R269 Unspecified abnormalities of gait and mobility: Secondary | ICD-10-CM

## 2016-06-25 DIAGNOSIS — G35 Multiple sclerosis: Secondary | ICD-10-CM

## 2016-06-25 DIAGNOSIS — G35D Multiple sclerosis, unspecified: Secondary | ICD-10-CM

## 2016-06-25 HISTORY — DX: Unspecified injury of head, initial encounter: S09.90XA

## 2016-06-25 NOTE — Progress Notes (Signed)
[facility]  PATIENT: Sandra Bonilla DOB: [birth year]  REFERRING DOCTOR OR PCP:   Dorisann Frames SOURCE: patient and medical records  _________________________________   HISTORICAL  CHIEF COMPLAINT:  Chief Complaint  Patient presents with  . Multiple Sclerosis    Sts. she continues to tolerate Aubagio well.  Sts. 10 days ago she fell down several brick steps at home, hitting head on concrete.  Laceration to right occipital region.  No LOC.  She was seen in the ER and had neg. CT's of head and neck, neg. x-rays of T and L spines. Sts. only lingering sx. is some pain/swelling at laceration site.  In Jan. 2018, she spent a week in the hospital with abd. pain, n/v; was dx. with diverticulitis/fim  . Head Injury  . Hospitalization Follow-up    HISTORY OF PRESENT ILLNESS:  Sandra Bonilla is a 41 yo woman with relpoapsing remitting MS.    She fell last week when going up 4 steps (carport to inside house).   She doesn't recall tripping but fell backwards and hit head on concrete.   No LOC but she had a headache x 2 days.   In the ED a head CT  MS:  She is on Aubagio tolerates it well. She denies any MS exacerbation.  Although she feels her MS is mostly stable, she notes she has a little more trouble understanding complicated tasks and people at work need to slow down and repeat task instructions.   MRI of the brain 04/25/2015 showed lesions in the white matter consistent with multiple sclerosis and MRI of the cervical spine that day showed multiple lesions in the upper cervical spinal cord but no acute findings.  There was no change when compared to 12/25/2013 MRI.     She remains on Aubagio since 2013 and is tolerating it well.   She brought in some MRIs of the brain and spinal cord from 2006. I compare that with her 2017 MRI. The MRI of the cervical spine looks essentially unchanged. The MRI of the brain shows that there has been some lesions over the ten-year difference  though majorityof the lesions were present on both MRIs.            .  Gait/strength/sensation:  She has mildly off balanced gait and veers to the right.   This is slightly worse over the past year.     Her left arm tingling improved and now is very intermittent.   Left grip is slightly worse than right (is left handed) Bladder/Bowel:   She denies any bladder or bowel difficulties.   She has nocturia x 1 some nights.    Vision:   She notes reduced VA with reading and has been prescribed reading glasses.   She had Lasik in the past.    No h/o optic neuritis or dipo[pia.     Fatigue/sleep: She has physical and cognitive fatigue, some days worse than others.  This is stable  . She has sleep maintenance insomnia.Imipramine helps with sleep onset and sleep maintenance.    She snores but no witnessed OSA and notes EDS.   Migraines:   These improved and she has had only one since the last visit.  Imipramine has helped.   When present, they are unilateral but can be either side.   Tylenol helps some.     She someimes has blurry vision but no aura.     Mood/cognition: She notes mild depression and mild anxiety.  She feels better.  nxiety is also better    She is currently on BuSpar 15 mg po tid and lamotrigine 200 mg po qd.  She has not noted major problems with cognition. Occasionally she has some issue with parallel processing. She notes she has a little more trouble understanding complicated tasks and people at work need to slow down and repeat task instructions.. She does not have any major problem with memory or word finding.  MS HIstory:       About  2006, she noted gait changes and her right foot was clumsy.    She had an MRI consistent with MS and was referred to Dr. Jacolyn Reedy [facility]) who diagnosed her with MS.   She then saw Dr. Felipe Drone and then Dr. Deloria Lair at [facility].  She was reluctant to take a medication until  2012 years ago when she started Copaxone.   She had skin reactions, she stopped  after one year and switched to Aubagio (+/- late 2013).   She has tolerated it well.     REVIEW OF SYSTEMS: Constitutional: No fevers, chills, sweats, or change in appetite   She notes fatigue and poor sleep.    Eyes: No visual changes, double vision, eye pain Ear, nose and throat: No hearing loss, ear pain, nasal congestion, sore throat Cardiovascular: No chest pain, palpitations Respiratory: No shortness of breath at rest or with exertion.   No wheezes.  Some snoring GastrointestinaI: No nausea, vomiting, diarrhea, abdominal pain, fecal incontinence Genitourinary: No dysuria, urinary retention or frequency.  No nocturia. Musculoskeletal: No neck pain, back pain Integumentary: No rash, pruritus, skin lesions Neurological: as above Psychiatric: No depression at this time. Some anxiety Endocrine: No palpitations, diaphoresis, change in appetite, change in weigh or increased thirst Hematologic/Lymphatic: No anemia, purpura, petechiae. Allergic/Immunologic: No itchy/runny eyes, nasal congestion, recent allergic reactions, rashes  ALLERGIES: Allergies  Allergen Reactions  . Septra [Sulfamethoxazole-Trimethoprim] Rash    HOME MEDICATIONS:  Current Outpatient Prescriptions:  .  busPIRone (BUSPAR) 15 MG tablet, Take 15 mg by mouth 3 (three) times daily. , Disp: , Rfl:  .  chlorthalidone (HYGROTON) 25 MG tablet, Take 12.5 mg by mouth daily., Disp: , Rfl:  .  lamoTRIgine (LAMICTAL) 200 MG tablet, Take 400 mg by mouth daily. , Disp: , Rfl:  .  loratadine (CLARITIN) 10 MG tablet, Take 10 mg by mouth daily., Disp: , Rfl:  .  Polyvinyl Alcohol-Povidone (REFRESH OP), Place 2 drops into both eyes 4 (four) times daily as needed (dryness)., Disp: , Rfl:  .  Teriflunomide (AUBAGIO) 14 MG TABS, Take 14 mg by mouth daily., Disp: 90 tablet, Rfl: 3 No current facility-administered medications for this visit.   Facility-Administered Medications Ordered in Other Visits:  .  gadopentetate dimeglumine  (MAGNEVIST) injection 20 mL, 20 mL, Intravenous, Once PRN, Britt Bottom, MD  PAST MEDICAL HISTORY: Past Medical History:  Diagnosis Date  . Hypertension   . MS (multiple sclerosis) ([place])     PAST SURGICAL HISTORY: Past Surgical History:  Procedure Laterality Date  . BREAST BIOPSY Right 2016   Fibroadenoma  . CHOLECYSTECTOMY    . DILATION AND CURETTAGE, DIAGNOSTIC / THERAPEUTIC      FAMILY HISTORY: Family History  Problem Relation Age of Onset  . Breast cancer Maternal Aunt 32    SOCIAL HISTORY:  Social History   Social History  . Marital status: Married    Spouse name: N/A  . Number of children: N/A  . Years of education: N/A  Occupational History  . Not on file.   Social History Main Topics  . Smoking status: Former Research scientist (life sciences)  . Smokeless tobacco: Never Used  . Alcohol use No  . Drug use: No  . Sexual activity: Not on file   Other Topics Concern  . Not on file   Social History Narrative  . No narrative on file     PHYSICAL EXAM  Vitals:   06/25/16 0903  BP: 114/80  Pulse: 76  Resp: (!) 6  Weight: 213 lb (96.6 kg)  Height: 5\' 5"  (1.651 m)    Body mass index is 35.45 kg/m.   General: The patient is well-developed and well-nourished and in no acute distress.   Small knot/scab posterior scalp on the right from fall.    Neurologic Exam  Mental status: The patient is alert and oriented x 3 at the time of the examination. The patient has apparent normal recent and remote memory, with an apparently normal attention span and concentration ability.   Speech is normal.  Cranial nerves: Extraocular movements are full.  Normal facial strength and sensation.   Trapezius and sternocleidomastoid strength is normal. No dysarthria is noted.  The tongue is midline, and the patient has symmetric elevation of the soft palate. No obvious hearing deficits are noted.  Motor:  Muscle bulk is normal.   Tone is normal. Strength is  5 / 5 in all 4 extremities.    Sensory: Sensory testing is intact to touch and temp and vibration in all 4 limbs.  Coordination: Cerebellar testing reveals good finger-nose-finger and heel-to-shin bilaterally.  Gait and station: Station is normal.   Gait is near normal and tandem is wide.   Romberg is negative.   Reflexes: Deep tendon reflexes are symmetric and normal bilaterally in arms and legs.    No clonus or spread.        DIAGNOSTIC DATA (LABS, IMAGING, TESTING) - I reviewed patient records, labs, notes, testing and imaging myself where available.  Lab Results  Component Value Date   WBC 9.0 04/25/2016   HGB 11.5 (L) 04/25/2016   HCT 36.2 04/25/2016   MCV 85.6 04/25/2016   PLT 307 04/25/2016      Component Value Date/Time   NA 139 04/25/2016 0416   NA 141 02/05/2016 1533   K 3.8 04/25/2016 0416   CL 105 04/25/2016 0416   CO2 28 04/25/2016 0416   GLUCOSE 100 (H) 04/25/2016 0416   BUN 9 04/25/2016 0416   BUN 9 02/05/2016 1533   CREATININE 0.85 04/25/2016 0416   CALCIUM 8.2 (L) 04/25/2016 0416   PROT 7.0 04/22/2016 0352   PROT 6.8 02/05/2016 1533   ALBUMIN 3.8 04/22/2016 0352   ALBUMIN 4.0 02/05/2016 1533   AST 17 04/22/2016 0352   ALT 19 04/22/2016 0352   ALKPHOS 93 04/22/2016 0352   BILITOT 1.5 (H) 04/22/2016 0352   BILITOT 0.5 02/05/2016 1533   GFRNONAA >60 04/25/2016 0416   GFRAA >60 04/25/2016 0416       ASSESSMENT AND PLAN  Multiple sclerosis (HCC)  Gait disturbance  Depression with anxiety  Injury of head, initial encounter  Insomnia, unspecified type  Other fatigue  Vitamin D deficiency   1.    Continue Aubagio 14 mg. She had bloodwork 03/2016 when she had diverticulitis.   2.    Continue  imipramine as it is helping both HA and insomnia.    3.    Continue Lamictal for mood and dysesthesia and Buspar for anxiety  4.   Take Vit D 4000-5000 U.  5.   We will check an MRI of the brain with and without contrast to determine if there has been any subclinical progression  of her MS that may have contributed to her recent gait disturbances and recent fall causing a head injury. If significant changes are noted, we will need to consider a different disease modifying therapy.  6.   She will return to see me in 6 months or sooner if there are new or worsening neurologic symptoms.   Richard A. Felecia Shelling, MD, PhD 08/26/4130, 4:40 AM Certified in Neurology, Clinical Neurophysiology, Sleep Medicine, Pain Medicine and Neuroimaging  [facility] [address] [place], [place] [address] [phone]

## 2016-07-02 ENCOUNTER — Ambulatory Visit (INDEPENDENT_AMBULATORY_CARE_PROVIDER_SITE_OTHER): Payer: 59

## 2016-07-02 DIAGNOSIS — R269 Unspecified abnormalities of gait and mobility: Secondary | ICD-10-CM

## 2016-07-02 DIAGNOSIS — G35 Multiple sclerosis: Secondary | ICD-10-CM

## 2016-07-02 DIAGNOSIS — S0990XA Unspecified injury of head, initial encounter: Secondary | ICD-10-CM

## 2016-07-02 MED ORDER — GADOPENTETATE DIMEGLUMINE 469.01 MG/ML IV SOLN
20.0000 mL | Freq: Once | INTRAVENOUS | Status: DC | PRN
Start: 2016-07-02 — End: 2018-10-31

## 2016-07-07 ENCOUNTER — Telehealth: Payer: Self-pay | Admitting: *Deleted

## 2016-07-07 NOTE — Telephone Encounter (Signed)
-----   Message from Britt Bottom, MD sent at 07/04/2016  6:15 PM EDT ----- Please let her know that the MRI was stable.  No new MS Lesions.

## 2016-07-07 NOTE — Telephone Encounter (Signed)
LMOM that per RAS, MRI brain is stable; no new MS lesions.  She does not need to return this call unless she has questions/fim

## 2016-07-07 NOTE — Telephone Encounter (Signed)
LMOM that per RAS, MRI brain is stable; no new MS lesions/fim

## 2016-07-31 ENCOUNTER — Encounter (HOSPITAL_COMMUNITY): Payer: Self-pay | Admitting: *Deleted

## 2016-07-31 ENCOUNTER — Encounter (HOSPITAL_COMMUNITY)
Admission: RE | Disposition: A | Discharge status: Home / Self Care | Payer: Self-pay | Source: Ambulatory Visit | Attending: General Surgery

## 2016-07-31 ENCOUNTER — Ambulatory Visit (HOSPITAL_COMMUNITY)
Admission: RE | Admit: 2016-07-31 | Discharge: 2016-07-31 | Disposition: A | Discharge status: Home / Self Care | Payer: 59 | Source: Ambulatory Visit | Attending: General Surgery | Admitting: General Surgery

## 2016-07-31 DIAGNOSIS — G35 Multiple sclerosis: Secondary | ICD-10-CM | POA: Diagnosis not present

## 2016-07-31 DIAGNOSIS — Z87891 Personal history of nicotine dependence: Secondary | ICD-10-CM | POA: Diagnosis not present

## 2016-07-31 DIAGNOSIS — K620 Anal polyp: Secondary | ICD-10-CM | POA: Insufficient documentation

## 2016-07-31 DIAGNOSIS — K5732 Diverticulitis of large intestine without perforation or abscess without bleeding: Secondary | ICD-10-CM | POA: Insufficient documentation

## 2016-07-31 DIAGNOSIS — I10 Essential (primary) hypertension: Secondary | ICD-10-CM | POA: Diagnosis not present

## 2016-07-31 HISTORY — PX: FLEXIBLE SIGMOIDOSCOPY: SHX5431

## 2016-07-31 SURGERY — SIGMOIDOSCOPY, FLEXIBLE

## 2016-07-31 MED ORDER — MIDAZOLAM HCL 10 MG/2ML IJ SOLN
INTRAMUSCULAR | Status: DC | PRN
  Administered 2016-07-31: 2 mg via INTRAVENOUS
  Administered 2016-07-31 (×2): 1 mg via INTRAVENOUS
  Administered 2016-07-31: 2 mg via INTRAVENOUS

## 2016-07-31 MED ORDER — FENTANYL CITRATE (PF) 100 MCG/2ML IJ SOLN
INTRAMUSCULAR | Status: AC
  Filled 2016-07-31: qty 2

## 2016-07-31 MED ORDER — DIPHENHYDRAMINE HCL 50 MG/ML IJ SOLN
INTRAMUSCULAR | Status: AC
  Filled 2016-07-31: qty 1

## 2016-07-31 MED ORDER — MIDAZOLAM HCL 5 MG/ML IJ SOLN
INTRAMUSCULAR | Status: AC
  Filled 2016-07-31: qty 2

## 2016-07-31 MED ORDER — FENTANYL CITRATE (PF) 100 MCG/2ML IJ SOLN
INTRAMUSCULAR | Status: DC | PRN
  Administered 2016-07-31 (×2): 25 ug via INTRAVENOUS

## 2016-07-31 MED ORDER — SODIUM CHLORIDE 0.9 % IV SOLN
INTRAVENOUS | Status: DC
  Administered 2016-07-31: 500 mL via INTRAVENOUS

## 2016-07-31 NOTE — Op Note (Signed)
[facility] Patient Name: Sandra Bonilla Procedure Date: 07/31/2016 MRN: [id number] Attending MD: [name] , MD Date of Birth: [birth year] CSN: [id number] Age: 41 Admit Type: Outpatient Procedure:                Flexible Sigmoidoscopy Indications:              Follow-up of diverticulitis Providers:                [name], MD, Elmer Ramp. Hinson, RN, William Dalton, Technician Referring MD:              Medicines:                Fentanyl 50 micrograms IV, Midazolam 6 mg IV,                            Sedation Administered by an Endoscopy Nurse, The                            level of sedation administered was moderate Complications:            No immediate complications. Estimated Blood Loss:     Estimated blood loss: none. Procedure:                Pre-Anesthesia Assessment:                           - Prior to the procedure, a History and Physical                            was performed, and patient medications and                            allergies were reviewed. The patient's tolerance of                            previous anesthesia was also reviewed. The risks                            and benefits of the procedure and the sedation                            options and risks were discussed with the patient.                            All questions were answered, and informed consent                            was obtained. Prior Anticoagulants: The patient has                            taken no previous anticoagulant or antiplatelet  agents. ASA Grade Assessment: II - A patient with                            mild systemic disease. After reviewing the risks                            and benefits, the patient was deemed in                            satisfactory condition to undergo the procedure.                           - The risks and benefits of the procedure and the    sedation options and risks were discussed with the                            patient. All questions were answered and informed                            consent was obtained.                           - Patient identification and proposed procedure                            were verified prior to the procedure by the                            physician, the nurse and the technician. The                            procedure was verified in the procedure room.                           - The anesthesia plan was to use moderate                            sedation/analgesia (conscious sedation).                           - The heart rate, respiratory rate, oxygen                            saturations, blood pressure, adequacy of pulmonary                            ventilation, and response to care were monitored                            throughout the procedure.                           After obtaining informed consent, the scope was  passed under direct vision. The was introduced                            through the anus The procedure was aborted. The                            scope was not inserted. sigmoid colon Medications                            were sigmoid colon. After obtaining informed                            consent, the scope was passed under direct vision.                            The flexible sigmoidoscopy was aborted due to                            restricted mobility of the colon. Increasing the                            dose of sedation medication, changing the patient's                            position, withdrawing and reinserting the scope and                            withdrawing the scope and replacing with the                            pediatric endoscope did not allow for the                            successful completion of the procedure. The                            flexible sigmoidoscopy was performed with  moderate                            difficulty due to restricted mobility of the colon. Scope In: 2:29:52 PM Scope Out: 2:44:46 PM Total Procedure Duration: 0 hours 14 minutes 54 seconds  Findings:      The digital rectal exam findings include anal polyp. Pertinent negatives       include normal sphincter tone. Impression:               - The procedure was aborted due to restricted                            mobility of the colon.                           - Anal polyp found on digital rectal exam.                           -  No specimens collected.                           - Mild diverticulosis in the distal sigmoid colon.                            There was no evidence of diverticular bleeding. Moderate Sedation:      Moderate (conscious) sedation was administered by the endoscopy nurse       and supervised by the endoscopist. The following parameters were       monitored: oxygen saturation, heart rate, blood pressure, and response       to care. Recommendation:           - Refer to a Psychologist, sport and exercise. Procedure Code(s):        --- Professional ---                           [phone], 76, Sigmoidoscopy, flexible; diagnostic,                            including collection of specimen(s) by brushing or                            washing, when performed (separate procedure) Diagnosis Code(s):        --- Professional ---                           Z53.8, Procedure and treatment not carried out for                            other reasons                           K57.32, Diverticulitis of large intestine without                            perforation or abscess without bleeding                           K57.30, Diverticulosis of large intestine without                            perforation or abscess without bleeding CPT copyright 2016 American Medical Association. All rights reserved. The codes documented in this report are preliminary and upon coder review may  be revised to meet current  compliance requirements. [name], MD [name], MD 2/0/6015 2:56:49 PM This report has been signed electronically. Number of Addenda: 0

## 2016-07-31 NOTE — Discharge Instructions (Signed)
Post Colonoscopy Instructions ° °1. DIET: Follow a light bland diet the first 24 hours after arrival home, such as soup, liquids, crackers, etc.  Be sure to include lots of fluids daily.  Avoid fast food or heavy meals as your are more likely to get nauseated.   °2. You may have some mild rectal bleeding for the first few days after the procedure.  This should get less and less with time.  Resume any blood thinners 2 days after your procedure unless directed otherwise by your physician. °3. Take your usually prescribed home medications unless otherwise directed. °a. If you have any pain, it is helpful to get up and walk around, as it is usually from excess gas. °b. If this is not helpful, you can take an over-the-counter pain medication.  Choose one of the following that works best for you: °i. Naproxen (Aleve, etc)  Two 220mg tabs twice a day °ii. Ibuprofen (Advil, etc) Three 200mg tabs four times a day (every meal & bedtime) °iii. If you still have pain after using one of these, please call the office °4. It is normal to not have a bowel movement for 2-3 days after colonoscopy.   ° °5. ACTIVITIES as tolerated:   °6. You may resume regular (light) daily activities beginning the next day--such as daily self-care, walking, climbing stairs--gradually increasing activities as tolerated.  ° ° °WHEN TO CALL US [phone]: °1. Fever over 101.5 F (38.5 C)  °2. Severe abdominal or chest pain  °3. Large amount of rectal bleeding, passing multiple blood clots  °4. Dizziness or shortness of breath °5. Increasing nausea or vomiting ° ° The clinic staff is available to answer your questions during regular business hours (8:30am-5pm).  Please don’t hesitate to call and ask to speak to one of our nurses for clinical concerns.  ° If you have a medical emergency, go to the nearest emergency room or call 911. ° A surgeon from [facility] is always on call at the hospitals ° ° °[facility] °[address], [address], [place], [place]  [address] ? °MAIN: [phone] ? TOLL FREE: [phone] ?  °FAX [phone] °[email or web address] ° ° °

## 2016-07-31 NOTE — H&P (Signed)
Sandra Bonilla is an 41 y.o. female.   Chief Complaint: diverticular disease HPI: 42 year old female who presents today for colonoscopy. She underwent a hospitalization in January for diverticulitis with microperforation. She's never had an episode of diverticulitis in the past. She is now symptom-free and has regular bowel movements. She has no history of pelvic surgery.  Past Medical History:  Diagnosis Date  . Hypertension   . MS (multiple sclerosis) ([place])     Past Surgical History:  Procedure Laterality Date  . BREAST BIOPSY Right 2016   Fibroadenoma  . CHOLECYSTECTOMY    . DILATION AND CURETTAGE, DIAGNOSTIC / THERAPEUTIC      Family History  Problem Relation Age of Onset  . Breast cancer Maternal Aunt 70   Social History:  reports that she has quit smoking. She has never used smokeless tobacco. She reports that she does not drink alcohol or use drugs.  Allergies:  Allergies  Allergen Reactions  . Septra [Sulfamethoxazole-Trimethoprim] Rash    Medications Prior to Admission  Medication Sig Dispense Refill  . busPIRone (BUSPAR) 15 MG tablet Take 15 mg by mouth 3 (three) times daily.     . chlorthalidone (HYGROTON) 25 MG tablet Take 12.5 mg by mouth daily.    Marland Kitchen lamoTRIgine (LAMICTAL) 200 MG tablet Take 400 mg by mouth daily.     Marland Kitchen loratadine (CLARITIN) 10 MG tablet Take 10 mg by mouth daily.    . Polyvinyl Alcohol-Povidone (REFRESH OP) Place 2 drops into both eyes 4 (four) times daily as needed (dryness).    . Teriflunomide (AUBAGIO) 14 MG TABS Take 14 mg by mouth daily. 90 tablet 3    No results found for this or any previous visit (from the past 48 hour(s)). No results found.  Review of Systems  Constitutional: Negative for chills, fever and weight loss.  HENT: Negative for hearing loss.   Eyes: Negative for blurred vision and double vision.  Respiratory: Negative for cough and shortness of breath.   Cardiovascular: Negative for chest pain and palpitations.   Gastrointestinal: Negative for abdominal pain, constipation, diarrhea, nausea and vomiting.  Genitourinary: Negative for dysuria, frequency and urgency.  Skin: Negative for itching and rash.    Blood pressure (!) 140/97, pulse 100, temperature 98.1 F (36.7 C), temperature source Oral, resp. rate 16, height 5\' 5"  (1.651 m), weight 96.6 kg (213 lb), SpO2 100 %. Physical Exam  Constitutional: She appears well-developed and well-nourished.  HENT:  Head: Normocephalic and atraumatic.  Eyes: Conjunctivae and EOM are normal. Pupils are equal, round, and reactive to light.  Neck: Normal range of motion. Neck supple.  Cardiovascular: Normal rate and regular rhythm.   Respiratory: Breath sounds normal. No respiratory distress.  GI: Soft. She exhibits no distension. There is no tenderness.     Assessment/Plan 41 y.o. F here for colonoscopy after microperf diverticulitis.  No current complaints.  Risks of bleeding, perforation, missed pathology and inability to complete exam were explained to the patient and she has agreed to proceed.    Rosario Adie., MD 08/02/5623, 2:18 PM

## 2016-08-04 ENCOUNTER — Encounter (HOSPITAL_COMMUNITY): Payer: Self-pay | Admitting: General Surgery

## 2016-08-05 ENCOUNTER — Ambulatory Visit: Payer: 59 | Admitting: Neurology

## 2016-08-19 ENCOUNTER — Other Ambulatory Visit: Payer: Self-pay | Admitting: Surgery

## 2016-08-19 DIAGNOSIS — K5732 Diverticulitis of large intestine without perforation or abscess without bleeding: Secondary | ICD-10-CM

## 2016-09-12 ENCOUNTER — Ambulatory Visit
Admission: RE | Admit: 2016-09-12 | Discharge: 2016-09-12 | Disposition: A | Discharge status: Home / Self Care | Payer: 59 | Source: Ambulatory Visit | Attending: Surgery | Admitting: Surgery

## 2016-09-12 DIAGNOSIS — K5732 Diverticulitis of large intestine without perforation or abscess without bleeding: Secondary | ICD-10-CM

## 2016-10-15 ENCOUNTER — Encounter: Payer: Self-pay | Admitting: Obstetrics and Gynecology

## 2016-10-15 ENCOUNTER — Ambulatory Visit (INDEPENDENT_AMBULATORY_CARE_PROVIDER_SITE_OTHER): Payer: 59 | Admitting: Obstetrics and Gynecology

## 2016-10-15 VITALS — BP 124/70 | Ht 65.0 in | Wt 215.0 lb

## 2016-10-15 DIAGNOSIS — Z01419 Encounter for gynecological examination (general) (routine) without abnormal findings: Secondary | ICD-10-CM | POA: Diagnosis not present

## 2016-10-15 DIAGNOSIS — Z1239 Encounter for other screening for malignant neoplasm of breast: Secondary | ICD-10-CM

## 2016-10-15 DIAGNOSIS — Z1231 Encounter for screening mammogram for malignant neoplasm of breast: Secondary | ICD-10-CM | POA: Diagnosis not present

## 2016-10-15 DIAGNOSIS — Z124 Encounter for screening for malignant neoplasm of cervix: Secondary | ICD-10-CM | POA: Diagnosis not present

## 2016-10-15 DIAGNOSIS — R87810 Cervical high risk human papillomavirus (HPV) DNA test positive: Secondary | ICD-10-CM

## 2016-10-15 DIAGNOSIS — Z1151 Encounter for screening for human papillomavirus (HPV): Secondary | ICD-10-CM | POA: Diagnosis not present

## 2016-10-15 NOTE — Progress Notes (Signed)
Chief Complaint  Patient presents with  . Annual Exam     HPI:      Sandra Bonilla is a 41 y.o. G0P0000 who LMP was No LMP recorded. Patient is not currently having periods (Reason: IUD)., presents today for her annual examination.  Her menses are absent due to IUD. Dysmenorrhea none. She does have occasional spotting.  Se x activity: single partner, contraception - IUD. Mirena replaced 11/14/15 Last Pap: October 15, 2015  Results were: no abnormalities /POS HPV DNA. She is due for repeat pap today. She is also on med for MS and should have yearly paps.  Hx of STDs: HPV  Last mammogram: June 04, 2016  Results were: normal--routine follow-up in 12 months There is a  FH of breast cancerin her mat aunt, genetic testing not indicated. There is no FH of ovarian cancer. The patient does do self-breast exams.  Tobacco use: The patient denies current or previous tobacco use. Alcohol use: none Exercise: not active  She does not get adequate calcium and Vitamin D in her diet.    Past Medical History:  Diagnosis Date  . Cervical high risk human papillomavirus (HPV) DNA test positive 09/2015  . Cholecystitis 2006   GALBLADDER REMOVED  . 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 History of mammogram 11/2014   FIBROADENOMA  . History of Papanicolaou smear of cervix 01/24/13; 10/15/15   -/-; -/+  . Hypertension   . Menorrhagia 02/01/2010   D/C HYSTEROSCOPY ENDO POLYP  . MS (multiple sclerosis) ([place])   . Ovarian cyst 9/15; 2015-2016   LEFT - WENT TO [facility]; RIGHT    Past Surgical History:  Procedure Laterality Date  . BREAST BIOPSY Right 2016   Fibroadenoma  . CHOLECYSTECTOMY  2006  . DILATION AND CURETTAGE, DIAGNOSTIC / THERAPEUTIC  02/01/2010   D/C HYSTEROSCOPY ENDO POLYP; [facility]  . FLEXIBLE SIGMOIDOSCOPY  07/31/2016   Procedure: FLEXIBLE SIGMOIDOSCOPY;  Surgeon: [name], MD;  Location: [facility];  Service: Endoscopy;;  . HYSTEROSCOPY  02/01/2010   ENDO POLYP - [facility]     Family History  Problem Relation Age of Onset  . Breast cancer Maternal Aunt 49  . Rheum arthritis Mother   . Hypertension Mother   . Thyroid disease Mother        HYPOTHYROIDISM  . Cancer Mother 3       LUNG  . Heart disease Father   . Diabetes Father   . Hypertension Father   . Cancer Paternal Grandmother        MELANOMA OF SKIN  . Cancer Cousin        KIDNEY-COUSIN    Social History   Social History  . Marital status: Divorced    Spouse name: N/A  . Number of children: 0  . Years of education: 14   Occupational History  .  [employer]   Social History Main Topics  . Smoking status: Former Research scientist (life sciences)  . Smokeless tobacco: Never Used  . Alcohol use No  . Drug use: No  . Sexual activity: Yes    Birth control/ protection: IUD   Other Topics Concern  . Not on file   Social History Narrative  . No narrative on file     Current Outpatient Prescriptions:  .  ARIPiprazole (ABILIFY) 5 MG tablet, Take 5 mg by mouth daily., Disp: , Rfl:  .  busPIRone (BUSPAR) 15 MG tablet, Take 15 mg by mouth 3 (three) times daily. , Disp: , Rfl:  .  chlorthalidone (HYGROTON) 25 MG tablet, Take 12.5 mg by mouth daily., Disp: , Rfl:  .  lamoTRIgine (LAMICTAL) 200 MG tablet, Take 400 mg by mouth daily. , Disp: , Rfl:  .  levonorgestrel (MIRENA) 20 MCG/24HR IUD, 1 each by Intrauterine route once., Disp: , Rfl:  .  loratadine (CLARITIN) 10 MG tablet, Take 10 mg by mouth daily., Disp: , Rfl:  .  Polyvinyl Alcohol-Povidone (REFRESH OP), Place 2 drops into both eyes 4 (four) times daily as needed (dryness)., Disp: , Rfl:  .  Teriflunomide (AUBAGIO) 14 MG TABS, Take 14 mg by mouth daily., Disp: 90 tablet, Rfl: 3 No current facility-administered medications for this visit.   Facility-Administered Medications Ordered in Other Visits:  .  gadopentetate dimeglumine (MAGNEVIST) injection 20 mL, 20 mL, Intravenous, Once PRN, Sater, Richard A, MD .  gadopentetate dimeglumine (MAGNEVIST) injection 20  mL, 20 mL, Intravenous, Once PRN, Sater, Nanine Means, MD  ROS:  Review of Systems  Constitutional: Negative for fatigue, fever and unexpected weight change.  Respiratory: Negative for cough, shortness of breath and wheezing.   Cardiovascular: Negative for chest pain, palpitations and leg swelling.  Gastrointestinal: Negative for blood in stool, constipation, diarrhea, nausea and vomiting.  Endocrine: Negative for cold intolerance, heat intolerance and polyuria.  Genitourinary: Negative for dyspareunia, dysuria, flank pain, frequency, genital sores, hematuria, menstrual problem, pelvic pain, urgency, vaginal bleeding, vaginal discharge and vaginal pain.  Musculoskeletal: Negative for back pain, joint swelling and myalgias.  Skin: Negative for rash.  Neurological: Negative for dizziness, syncope, light-headedness, numbness and headaches.  Hematological: Negative for adenopathy.  Psychiatric/Behavioral: Negative for agitation, confusion, sleep disturbance and suicidal ideas. The patient is not nervous/anxious.      Objective: BP 124/70   Ht 5\' 5"  (1.651 m)   Wt 215 lb (97.5 kg)   BMI 35.78 kg/m    Physical Exam  Constitutional: She is oriented to person, place, and time. She appears well-developed and well-nourished.  Genitourinary: Vagina normal and uterus normal. There is no rash or tenderness on the right labia. There is no rash or tenderness on the left labia. No erythema or tenderness in the vagina. No vaginal discharge found. Right adnexum does not display mass and does not display tenderness. Left adnexum does not display mass and does not display tenderness.  Cervix exhibits visible IUD strings. Cervix does not exhibit motion tenderness or polyp. Uterus is not enlarged or tender.  Neck: Normal range of motion. No thyromegaly present.  Cardiovascular: Normal rate, regular rhythm and normal heart sounds.   No murmur heard. Pulmonary/Chest: Effort normal and breath sounds normal.  Right breast exhibits no mass, no nipple discharge, no skin change and no tenderness. Left breast exhibits no mass, no nipple discharge, no skin change and no tenderness.  Abdominal: Soft. There is no tenderness. There is no guarding.  Musculoskeletal: Normal range of motion.  Neurological: She is alert and oriented to person, place, and time. No cranial nerve deficit.  Psychiatric: She has a normal mood and affect. Her behavior is normal.  Vitals reviewed.    Assessment/Plan: Encounter for annual routine gynecological examination  Cervical cancer screening - Will call pt with results. - Plan: IGP, Aptima HPV  Screening for HPV (human papillomavirus) - Plan: IGP, Aptima HPV  Cervical high risk human papillomavirus (HPV) DNA test positive - Plan: IGP, Aptima HPV  Screening for breast cancer - Pt current on mammo. Due 3/19 - Plan: MM DIGITAL SCREENING BILATERAL  GYN counsel mammography screening, adequate intake of calcium and vitamin D, diet and exercise     F/U  Return in about 1 year (around 10/15/2017).  Sandra Lenig B. Evelen Vazguez, PA-C 10/15/2016 4:38 PM

## 2016-10-29 ENCOUNTER — Telehealth: Payer: Self-pay | Admitting: Obstetrics and Gynecology

## 2016-10-29 DIAGNOSIS — R87612 Low grade squamous intraepithelial lesion on cytologic smear of cervix (LGSIL): Secondary | ICD-10-CM

## 2016-10-29 DIAGNOSIS — B373 Candidiasis of vulva and vagina: Secondary | ICD-10-CM

## 2016-10-29 DIAGNOSIS — B3731 Acute candidiasis of vulva and vagina: Secondary | ICD-10-CM

## 2016-10-29 LAB — IGP, APTIMA HPV
HPV Aptima: POSITIVE — AB
PAP SMEAR COMMENT: 0

## 2016-10-29 MED ORDER — FLUCONAZOLE 150 MG PO TABS: 150.0000 mg | ORAL_TABLET | Freq: Once | ORAL | 0 refills | Status: AC

## 2016-10-29 MED ORDER — FLUCONAZOLE 150 MG PO TABS: 150.0000 mg | ORAL_TABLET | Freq: Once | ORAL | 0 refills | Status: DC

## 2016-10-29 NOTE — Telephone Encounter (Signed)
Pt aware of LGSIL and need for colpo. Appt sched.  Pt also had yeast on pap and would like diflucan Rx prn sx. Rx eRxd. F/u prn.

## 2016-12-02 ENCOUNTER — Ambulatory Visit (INDEPENDENT_AMBULATORY_CARE_PROVIDER_SITE_OTHER): Payer: 59 | Admitting: Obstetrics & Gynecology

## 2016-12-02 ENCOUNTER — Encounter: Payer: Self-pay | Admitting: Obstetrics & Gynecology

## 2016-12-02 VITALS — BP 120/80 | HR 115 | Ht 64.0 in | Wt 213.0 lb

## 2016-12-02 DIAGNOSIS — R87612 Low grade squamous intraepithelial lesion on cytologic smear of cervix (LGSIL): Secondary | ICD-10-CM

## 2016-12-02 NOTE — Patient Instructions (Signed)

## 2016-12-02 NOTE — Progress Notes (Signed)
Referring Provider:  [facility]  HPI:  Sandra Bonilla is a 41 y.o.  G0P0000  who presents today for evaluation and management of abnormal cervical cytology.    Dysplasia History:  LGSIL 2018, HPV POS 2017  ROS:  Pertinent items are noted in HPI.  OB History  Gravida Para Term Preterm AB Living  0 0 0 0 0 0  SAB TAB Ectopic Multiple Live Births  0 0 0 0 0        Past Medical History:  Diagnosis Date  . Cervical high risk human papillomavirus (HPV) DNA test positive 09/2015  . Cholecystitis 2006   GALBLADDER REMOVED  . 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 History of mammogram 11/2014   FIBROADENOMA  . History of Papanicolaou smear of cervix 01/24/13; 10/15/15   -/-; -/+  . Hypertension   . Menorrhagia 02/01/2010   D/C HYSTEROSCOPY ENDO POLYP  . MS (multiple sclerosis) ([place])   . Ovarian cyst 9/15; 2015-2016   LEFT - WENT TO [facility]; RIGHT    Past Surgical History:  Procedure Laterality Date  . BREAST BIOPSY Right 2016   Fibroadenoma  . CHOLECYSTECTOMY  2006  . DILATION AND CURETTAGE, DIAGNOSTIC / THERAPEUTIC  02/01/2010   D/C HYSTEROSCOPY ENDO POLYP; [facility]  . FLEXIBLE SIGMOIDOSCOPY  07/31/2016   Procedure: FLEXIBLE SIGMOIDOSCOPY;  Surgeon: [name], MD;  [facility];  Service: Endoscopy;;  . HYSTEROSCOPY  02/01/2010   ENDO POLYP - [facility]    SOCIAL HISTORY: History  Alcohol Use No   History  Drug Use No     Family History  Problem Relation Age of Onset  . Breast cancer Maternal Aunt 70  . Rheum arthritis Mother   . Hypertension Mother   . Thyroid disease Mother        HYPOTHYROIDISM  . Cancer Mother 71       LUNG  . Heart disease Father   . Diabetes Father   . Hypertension Father   . Cancer Paternal Grandmother        MELANOMA OF SKIN  . Cancer Cousin        KIDNEY-COUSIN    ALLERGIES:  Septra [sulfamethoxazole-trimethoprim]  Current Outpatient Prescriptions on File Prior to Visit  Medication Sig Dispense Refill  . ARIPiprazole (ABILIFY)  5 MG tablet Take 5 mg by mouth daily.    . busPIRone (BUSPAR) 15 MG tablet Take 15 mg by mouth 3 (three) times daily.     . chlorthalidone (HYGROTON) 25 MG tablet Take 12.5 mg by mouth daily.    Marland Kitchen lamoTRIgine (LAMICTAL) 200 MG tablet Take 400 mg by mouth daily.     Marland Kitchen levonorgestrel (MIRENA) 20 MCG/24HR IUD 1 each by Intrauterine route once.    . loratadine (CLARITIN) 10 MG tablet Take 10 mg by mouth daily.    . Polyvinyl Alcohol-Povidone (REFRESH OP) Place 2 drops into both eyes 4 (four) times daily as needed (dryness).    . Teriflunomide (AUBAGIO) 14 MG TABS Take 14 mg by mouth daily. 90 tablet 3   Current Facility-Administered Medications on File Prior to Visit  Medication Dose Route Frequency Provider Last Rate Last Dose  . gadopentetate dimeglumine (MAGNEVIST) injection 20 mL  20 mL Intravenous Once PRN Sater, Richard A, MD      . gadopentetate dimeglumine (MAGNEVIST) injection 20 mL  20 mL Intravenous Once PRN Sater, Nanine Means, MD        Physical Exam: -Vitals:  BP 120/80   Pulse (!) 115  Ht 5\' 4"  (1.626 m)   Wt 213 lb (96.6 kg)   BMI 36.56 kg/m  GEN: WD, WN, NAD.  A+ O x 3, good mood and affect. ABD:  NT, ND.  Soft, no masses.  No hernias noted.   Pelvic:   Vulva: Normal appearance.  No lesions.  Vagina: No lesions or abnormalities noted.  Support: Normal pelvic support.  Urethra No masses tenderness or scarring.  Meatus Normal size without lesions or prolapse.  Cervix: See below.  Anus: Normal exam.  No lesions.  Perineum: Normal exam.  No lesions.        Bimanual   Uterus: Normal size.  Non-tender.  Mobile.  AV.  Adnexae: No masses.  Non-tender to palpation.  Cul-de-sac: Negative for abnormality.   PROCEDURE: 1.  Urine Pregnancy Test:  not done 2.  Colposcopy performed with 4% acetic acid after verbal consent obtained                                         -Aceto-white Lesions Location(s): 11 o'clock.              -Biopsy performed at 11, 6 o'clock                -ECC indicated and performed: Yes.       -Biopsy sites made hemostatic with pressure, AgNO3, and/or Monsel's solution   -Satisfactory colposcopy: Yes.      -Evidence of Invasive cervical CA :  NO  ASSESSMENT:  Sandra Bonilla is a 41 y.o. G0P0000 here for  1. Low grade squamous intraepithelial lesion (LGSIL) on cervical Pap smear   . PLAN: 1.  I discussed the grading system of pap smears and HPV high risk viral types.  We will discuss and base management after colpo results return. 2. Follow up PAP 6 months, vs intervention if high grade dysplasia identified 3. Treatment of persistantly abnormal PAP smears and cervical dysplasia, even mild, is discussed w pt today in detail, as well as the pros and cons of Cryo and LEEP procedures. Will consider if persists for yearsand discuss after results.      Barnett Applebaum, MD, [facility], [facility] 12/02/2016  3:29 PM

## 2016-12-04 LAB — PATHOLOGY

## 2016-12-08 ENCOUNTER — Telehealth: Payer: Self-pay | Admitting: Obstetrics & Gynecology

## 2016-12-08 NOTE — Telephone Encounter (Signed)
Pt is returning call from Dr. Kenton Kingfisher. Pt says you may leave  An detailed message

## 2016-12-08 NOTE — Progress Notes (Signed)
LM to call back.  Will need to discuss normal biopsy results after having LGSIL PAP, and need for f/u PAP every 6 mos until pattern of normalcy develops.

## 2016-12-09 NOTE — Progress Notes (Signed)
LM

## 2016-12-16 ENCOUNTER — Telehealth: Payer: Self-pay

## 2016-12-16 NOTE — Telephone Encounter (Signed)
Pt states she and PH are playing phonetag re results from procedure.  [phone]

## 2016-12-26 ENCOUNTER — Ambulatory Visit (INDEPENDENT_AMBULATORY_CARE_PROVIDER_SITE_OTHER): Payer: 59 | Admitting: Neurology

## 2016-12-26 ENCOUNTER — Encounter: Payer: Self-pay | Admitting: Neurology

## 2016-12-26 VITALS — BP 100/60 | HR 68 | Resp 16 | Ht 64.0 in | Wt 215.5 lb

## 2016-12-26 DIAGNOSIS — R269 Unspecified abnormalities of gait and mobility: Secondary | ICD-10-CM | POA: Diagnosis not present

## 2016-12-26 DIAGNOSIS — R208 Other disturbances of skin sensation: Secondary | ICD-10-CM | POA: Diagnosis not present

## 2016-12-26 DIAGNOSIS — G35 Multiple sclerosis: Secondary | ICD-10-CM

## 2016-12-26 DIAGNOSIS — R5383 Other fatigue: Secondary | ICD-10-CM

## 2016-12-26 DIAGNOSIS — G4700 Insomnia, unspecified: Secondary | ICD-10-CM | POA: Diagnosis not present

## 2016-12-26 MED ORDER — MELOXICAM 15 MG PO TABS: 15.0000 mg | ORAL_TABLET | Freq: Every day | ORAL | 5 refills | Status: DC

## 2016-12-26 MED ORDER — CYCLOBENZAPRINE HCL 5 MG PO TABS: 5.0000 mg | ORAL_TABLET | Freq: Every evening | ORAL | 11 refills | Status: DC | PRN

## 2016-12-26 NOTE — Progress Notes (Signed)
[facility]  PATIENT: Sandra Bonilla DOB: 1975-04-23  REFERRING DOCTOR OR PCP:   Dorisann Frames SOURCE: patient and medical records  _________________________________   HISTORICAL  CHIEF COMPLAINT:  Chief Complaint  Patient presents with  . Multiple Sclerosis    Sts. she continues to tolerate Aubagio well.  C/O nonradiating left lbp onset couple of mos. ago--"Pops when I walk."  H/A's ok.  She stopped Imipramine due to next day drowsiness./fim    HISTORY OF PRESENT ILLNESS:  Sandra Bonilla is a 41 yo woman with relapsing remitting MS.      Update 12/26/2016:    She feels her MS is stable and she tolerates Aubagio well.   She denies exacerbations but her gait is mildly off balanced as before.   She denies weakness or numbness.   She sometimes has vertigo but no falls.      Bladder is fine.   Vision is mildly blurry but better with eye drops.   She reports fatigue most days.    She has insomnia, sleep maintenance > sleep onset.      Migraines are doing well, only occurring once most months.   Ibuprofen helps relieve pain.  She has had LBP x 2 months, pain is L > R and does not radiate into her legs.    Pain does not change with positions or activities.   She denies numbness/weakness in hr legs.    Ibuprofen helps the pain some.    She takes it once , usually at night.     _____________________________________ From 06/25/2016 MS:  She is on Aubagio tolerates it well. She denies any MS exacerbation.  Although she feels her MS is mostly stable, she notes she has a little more trouble understanding complicated tasks and people at work need to slow down and repeat task instructions.   MRI of the brain 04/25/2015 showed lesions in the white matter consistent with multiple sclerosis and MRI of the cervical spine that day showed multiple lesions in the upper cervical spinal cord but no acute findings.  There was no change when compared to 12/25/2013 MRI.     She remains  on Aubagio since 2013 and is tolerating it well.   She brought in some MRIs of the brain and spinal cord from 2006. I compare that with her 2017 MRI. The MRI of the cervical spine looks essentially unchanged. The MRI of the brain shows that there has been some lesions over the ten-year difference though majorityof the lesions were present on both MRIs.            .  Gait/strength/sensation:  She has mildly off balanced gait and veers to the right.   This is slightly worse over the past year.     Her left arm tingling improved and now is very intermittent.   Left grip is slightly worse than right (is left handed) Bladder/Bowel:   She denies any bladder or bowel difficulties.   She has nocturia x 1 some nights.    Vision:   She notes reduced VA with reading and has been prescribed reading glasses.   She had Lasik in the past.    No h/o optic neuritis or dipo[pia.     Fatigue/sleep: She has physical and cognitive fatigue, some days worse than others.  This is stable  . She has sleep maintenance insomnia.Imipramine helps with sleep onset and sleep maintenance.    She snores but no witnessed OSA and notes EDS.  Migraines:   These improved and she has had only one since the last visit.  Imipramine has helped.   When present, they are unilateral but can be either side.   Tylenol helps some.     She someimes has blurry vision but no aura.     Mood/cognition: She notes mild depression and mild anxiety.     She feels better.  nxiety is also better    She is currently on BuSpar 15 mg po tid and lamotrigine 200 mg po qd.  She has not noted major problems with cognition. Occasionally she has some issue with parallel processing. She notes she has a little more trouble understanding complicated tasks and people at work need to slow down and repeat task instructions.. She does not have any major problem with memory or word finding.  MS HIstory:       About  2006, she noted gait changes and her right foot was clumsy.     She had an MRI consistent with MS and was referred to Dr. Jacolyn Reedy [facility]) who diagnosed her with MS.   She then saw Dr. Felipe Drone and then Dr. Deloria Lair at [facility].  She was reluctant to take a medication until  2012 years ago when she started Copaxone.   She had skin reactions, she stopped after one year and switched to Aubagio (+/- late 2013).   She has tolerated it well.     REVIEW OF SYSTEMS: Constitutional: No fevers, chills, sweats, or change in appetite   She notes fatigue and poor sleep.    Eyes: No visual changes, double vision, eye pain Ear, nose and throat: No hearing loss, ear pain, nasal congestion, sore throat Cardiovascular: No chest pain, palpitations Respiratory: No shortness of breath at rest or with exertion.   No wheezes.  Some snoring GastrointestinaI: No nausea, vomiting, diarrhea, abdominal pain, fecal incontinence Genitourinary: No dysuria, urinary retention or frequency.  No nocturia. Musculoskeletal: No neck pain, back pain Integumentary: No rash, pruritus, skin lesions Neurological: as above Psychiatric: No depression at this time. Some anxiety Endocrine: No palpitations, diaphoresis, change in appetite, change in weigh or increased thirst Hematologic/Lymphatic: No anemia, purpura, petechiae. Allergic/Immunologic: No itchy/runny eyes, nasal congestion, recent allergic reactions, rashes  ALLERGIES: Allergies  Allergen Reactions  . Septra [Sulfamethoxazole-Trimethoprim] Rash    HOME MEDICATIONS:  Current Outpatient Prescriptions:  .  busPIRone (BUSPAR) 15 MG tablet, Take 15 mg by mouth 3 (three) times daily. , Disp: , Rfl:  .  chlorthalidone (HYGROTON) 25 MG tablet, Take 12.5 mg by mouth daily., Disp: , Rfl:  .  lamoTRIgine (LAMICTAL) 200 MG tablet, Take 400 mg by mouth daily. , Disp: , Rfl:  .  levonorgestrel (MIRENA) 20 MCG/24HR IUD, 1 each by Intrauterine route once., Disp: , Rfl:  .  loratadine (CLARITIN) 10 MG tablet, Take 10 mg by mouth daily.,  Disp: , Rfl:  .  Polyvinyl Alcohol-Povidone (REFRESH OP), Place 2 drops into both eyes 4 (four) times daily as needed (dryness)., Disp: , Rfl:  .  Teriflunomide (AUBAGIO) 14 MG TABS, Take 14 mg by mouth daily., Disp: 90 tablet, Rfl: 3 .  cyclobenzaprine (FLEXERIL) 5 MG tablet, Take 1 tablet (5 mg total) by mouth at bedtime as needed., Disp: 30 tablet, Rfl: 11 .  meloxicam (MOBIC) 15 MG tablet, Take 1 tablet (15 mg total) by mouth daily., Disp: 30 tablet, Rfl: 5 No current facility-administered medications for this visit.   Facility-Administered Medications Ordered in Other Visits:  .  gadopentetate dimeglumine (  MAGNEVIST) injection 20 mL, 20 mL, Intravenous, Once PRN, Sater, Richard A, MD .  gadopentetate dimeglumine (MAGNEVIST) injection 20 mL, 20 mL, Intravenous, Once PRN, Sater, Nanine Means, MD  PAST MEDICAL HISTORY: Past Medical History:  Diagnosis Date  . Cervical high risk human papillomavirus (HPV) DNA test positive 09/2015  . Cholecystitis 2006   GALBLADDER REMOVED  . 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 History of mammogram 11/2014   FIBROADENOMA  . History of Papanicolaou smear of cervix 01/24/13; 10/15/15   -/-; -/+  . Hypertension   . Menorrhagia 02/01/2010   D/C HYSTEROSCOPY ENDO POLYP  . MS (multiple sclerosis) ([place])   . Ovarian cyst 9/15; 2015-2016   LEFT - WENT TO [place]; RIGHT    PAST SURGICAL HISTORY: Past Surgical History:  Procedure Laterality Date  . BREAST BIOPSY Right 2016   Fibroadenoma  . CHOLECYSTECTOMY  2006  . DILATION AND CURETTAGE, DIAGNOSTIC / THERAPEUTIC  02/01/2010   D/C HY[facility]OSCOPY ENDO POLYP; [facility]  . FLEXIBLE SIGMOIDOSCOPY  07/31/2016   Procedure: FLEXIBLE SIGMOIDOSCOPY;  Surgeon: [name], MD;  [facility];  Service: Endoscopy;;  . HYSTEROSCOPY  02/01/2010   ENDO POLYP - [facility]    FAMILY HISTORY: Family History  Problem Relation Age of Onset  . Breast cancer Maternal Aunt 49  . Rheum arthritis Mother   . Hypertension Mother     . Thyroid disease Mother        HYPOTHYROIDISM  . Cancer Mother 100       LUNG  . Heart disease Father   . Diabetes Father   . Hypertension Father   . Cancer Paternal Grandmother        MELANOMA OF SKIN  . Cancer Cousin        KIDNEY-COUSIN    SOCIAL HISTORY:  Social History   Social History  . Marital status: Divorced    Spouse name: N/A  . Number of children: 0  . Years of education: 14   Occupational History  .  [employer]   Social History Main Topics  . Smoking status: Former Research scientist (life sciences)  . Smokeless tobacco: Never Used  . Alcohol use No  . Drug use: No  . Sexual activity: Yes    Birth control/ protection: IUD   Other Topics Concern  . Not on file   Social History Narrative  . No narrative on file     PHYSICAL EXAM  Vitals:   12/26/16 0828  BP: 100/60  Pulse: 68  Resp: 16  Weight: 215 lb 8 oz (97.8 kg)  Height: 5\' 4"  (1.626 m)    Body mass index is 36.99 kg/m.   General: The patient is well-developed and well-nourished and in no acute distress.   Small knot/scab posterior scalp on the right from fall.    Neurologic Exam  Mental status: The patient is alert and oriented x 3 at the time of the examination. The patient has apparent normal recent and remote memory, with an apparently normal attention span and concentration ability.   Speech is normal.  Cranial nerves: Extraocular muscles are normal. Facial strength and sensation is normal. Trapezius strength is normal. The tongue is midline, and the patient has symmetric elevation of the soft palate. No obvious hearing deficits are noted.  Motor:  Muscle bulk is normal.   Tone is normal. Strength is  5 / 5 in all 4 extremities.   Sensory: Sensory testing is intact to touch and temp and vibration in all 4 limbs.  Coordination: Cerebellar testing reveals good finger-nose-finger and heel-to-shin bilaterally.  Gait and station: Station is normal.   The gait is normal but tandem gait is mildly wide..    Romberg is negative.   Reflexes: Deep tendon reflexes are symmetric and normal bilaterally in arms and legs.    No clonus or spread.        DIAGNOSTIC DATA (LABS, IMAGING, TESTING) - I reviewed patient records, labs, notes, testing and imaging myself where available.  Lab Results  Component Value Date   WBC 9.0 04/25/2016   HGB 11.5 (L) 04/25/2016   HCT 36.2 04/25/2016   MCV 85.6 04/25/2016   PLT 307 04/25/2016      Component Value Date/Time   NA 139 04/25/2016 0416   NA 141 02/05/2016 1533   K 3.8 04/25/2016 0416   CL 105 04/25/2016 0416   CO2 28 04/25/2016 0416   GLUCOSE 100 (H) 04/25/2016 0416   BUN 9 04/25/2016 0416   BUN 9 02/05/2016 1533   CREATININE 0.85 04/25/2016 0416   CALCIUM 8.2 (L) 04/25/2016 0416   PROT 7.0 04/22/2016 0352   PROT 6.8 02/05/2016 1533   ALBUMIN 3.8 04/22/2016 0352   ALBUMIN 4.0 02/05/2016 1533   AST 17 04/22/2016 0352   ALT 19 04/22/2016 0352   ALKPHOS 93 04/22/2016 0352   BILITOT 1.5 (H) 04/22/2016 0352   BILITOT 0.5 02/05/2016 1533   GFRNONAA >60 04/25/2016 0416   GFRAA >60 04/25/2016 0416       ASSESSMENT AND PLAN  Multiple sclerosis, relapsing-remitting (HCC)  Gait disturbance  Insomnia, unspecified type  Dysesthesia  Other fatigue   1.    For her MS, she will continue Aubagio 40 mg. She will be getting blood work at her primary care's practice in 2 months.   2.    Continue Lamictal for mood and dysesthesia and Buspar for anxiety 3.    For the back pain, I will add cyclobenzaprine 5 mg nightly and meloxicam 15 mg daily.   4.   She will return to see me in 6 months or sooner if there are new or worsening neurologic symptoms.   Richard A. Felecia Shelling, MD, PhD 04/30/8655, 8:46 AM Certified in Neurology, Clinical Neurophysiology, Sleep Medicine, Pain Medicine and Neuroimaging  [facility] [address] [place], [place] [address] [phone]

## 2017-01-25 ENCOUNTER — Other Ambulatory Visit: Payer: Self-pay | Admitting: Neurology

## 2017-02-23 ENCOUNTER — Other Ambulatory Visit: Payer: Self-pay | Admitting: Neurology

## 2017-03-22 ENCOUNTER — Other Ambulatory Visit: Payer: Self-pay | Admitting: Neurology

## 2017-05-12 ENCOUNTER — Other Ambulatory Visit: Payer: Self-pay | Admitting: Obstetrics and Gynecology

## 2017-05-12 DIAGNOSIS — Z1231 Encounter for screening mammogram for malignant neoplasm of breast: Secondary | ICD-10-CM

## 2017-06-03 ENCOUNTER — Ambulatory Visit: Payer: 59 | Admitting: Obstetrics and Gynecology

## 2017-06-23 ENCOUNTER — Ambulatory Visit: Payer: Self-pay | Admitting: Obstetrics and Gynecology

## 2017-06-29 ENCOUNTER — Encounter (INDEPENDENT_AMBULATORY_CARE_PROVIDER_SITE_OTHER): Payer: Self-pay

## 2017-06-29 ENCOUNTER — Ambulatory Visit (INDEPENDENT_AMBULATORY_CARE_PROVIDER_SITE_OTHER): Payer: BLUE CROSS/BLUE SHIELD | Admitting: Neurology

## 2017-06-29 ENCOUNTER — Other Ambulatory Visit: Payer: Self-pay

## 2017-06-29 ENCOUNTER — Encounter: Payer: Self-pay | Admitting: Neurology

## 2017-06-29 VITALS — BP 114/62 | HR 68 | Resp 14 | Ht 64.0 in | Wt 220.0 lb

## 2017-06-29 DIAGNOSIS — R5383 Other fatigue: Secondary | ICD-10-CM

## 2017-06-29 DIAGNOSIS — G35 Multiple sclerosis: Secondary | ICD-10-CM

## 2017-06-29 DIAGNOSIS — R269 Unspecified abnormalities of gait and mobility: Secondary | ICD-10-CM

## 2017-06-29 DIAGNOSIS — F418 Other specified anxiety disorders: Secondary | ICD-10-CM

## 2017-06-29 DIAGNOSIS — G4700 Insomnia, unspecified: Secondary | ICD-10-CM | POA: Diagnosis not present

## 2017-06-29 NOTE — Progress Notes (Signed)
[facility]  PATIENT: Sandra Bonilla DOB: [birth year]  REFERRING DOCTOR OR PCP:   Dorisann Frames SOURCE: patient and medical records  _________________________________   HISTORICAL  CHIEF COMPLAINT:  Chief Complaint  Patient presents with  . Multiple Sclerosis    Sts. she continues to tolerate Aubagio well; denies new or worsening sx/fim    HISTORY OF PRESENT ILLNESS:  Sandra Bonilla is a 42 yo woman with relapsing remitting MS.      Update 06/29/2017: She feels her MS is mostly stable. She tolerates Aubagio well.   The last MRI 06/2016 was stable.  She is sometimes veering to the left when she walks.   No vertigo.   Her strength is normal though her right leg sometimes feels like it will give out.   She denies numbness or dysesthesia.   Vision is ok (needs reading glasses).   No diplopia.      She has some nocturia (x2).     Fatigue varies and she feels she gets tired easily.    She sleeps well most nights.   She denies depression but notes some anxiety.     She feels processing os sometimes slow with language (asks people to repeat) but no major problems.  Migraines are doing well --- no bad ones recently.   She also has left lower back pain without radiation.  She tries to exercise at the gym twice weekly.  Update 12/26/2016:    She feels her MS is stable and she tolerates Aubagio well.   She denies exacerbations but her gait is mildly off balanced as before.   She denies weakness or numbness.   She sometimes has vertigo but no falls.      Bladder is fine.   Vision is mildly blurry but better with eye drops.   She reports fatigue most days.    She has insomnia, sleep maintenance > sleep onset.      Migraines are doing well, only occurring once most months.   Ibuprofen helps relieve pain.  She has had LBP x 2 months, pain is L > R and does not radiate into her legs.    Pain does not change with positions or activities.   She denies numbness/weakness in hr legs.     Ibuprofen helps the pain some.    She takes it once , usually at night.     _____________________________________ From 06/25/2016 MS:  She is on Aubagio tolerates it well. She denies any MS exacerbation.  Although she feels her MS is mostly stable, she notes she has a little more trouble understanding complicated tasks and people at work need to slow down and repeat task instructions.   MRI of the brain 04/25/2015 showed lesions in the white matter consistent with multiple sclerosis and MRI of the cervical spine that day showed multiple lesions in the upper cervical spinal cord but no acute findings.  There was no change when compared to 12/25/2013 MRI.     She remains on Aubagio since 2013 and is tolerating it well.   She brought in some MRIs of the brain and spinal cord from 2006. I compare that with her 2017 MRI. The MRI of the cervical spine looks essentially unchanged. The MRI of the brain shows that there has been some lesions over the ten-year difference though majorityof the lesions were present on both MRIs.            .  Gait/strength/sensation:  She has mildly off  balanced gait and veers to the right.   This is slightly worse over the past year.     Her left arm tingling improved and now is very intermittent.   Left grip is slightly worse than right (is left handed) Bladder/Bowel:   She denies any bladder or bowel difficulties.   She has nocturia x 1 some nights.    Vision:   She notes reduced VA with reading and has been prescribed reading glasses.   She had Lasik in the past.    No h/o optic neuritis or dipo[pia.     Fatigue/sleep: She has physical and cognitive fatigue, some days worse than others.  This is stable  . She has sleep maintenance insomnia.Imipramine helps with sleep onset and sleep maintenance.    She snores but no witnessed OSA and notes EDS.   Migraines:   These improved and she has had only one since the last visit.  Imipramine has helped.   When present, they are  unilateral but can be either side.   Tylenol helps some.     She someimes has blurry vision but no aura.     Mood/cognition: She notes mild depression and mild anxiety.     She feels better.  nxiety is also better    She is currently on BuSpar 15 mg po tid and lamotrigine 200 mg po qd.  She has not noted major problems with cognition. Occasionally she has some issue with parallel processing. She notes she has a little more trouble understanding complicated tasks and people at work need to slow down and repeat task instructions.. She does not have any major problem with memory or word finding.  MS HIstory:       About  2006, she noted gait changes and her right foot was clumsy.    She had an MRI consistent with MS and was referred to Dr. Jacolyn Reedy [facility]) who diagnosed her with MS.   She then saw Dr. Felipe Drone and then Dr. Deloria Lair at [facility].  She was reluctant to take a medication until  2012 years ago when she started Copaxone.   She had skin reactions, she stopped after one year and switched to Aubagio (+/- late 2013).   She has tolerated it well.     REVIEW OF SYSTEMS: Constitutional: No fevers, chills, sweats, or change in appetite   She notes fatigue and poor sleep.    Eyes: No visual changes, double vision, eye pain Ear, nose and throat: No hearing loss, ear pain, nasal congestion, sore throat Cardiovascular: No chest pain, palpitations Respiratory: No shortness of breath at rest or with exertion.   No wheezes.  Some snoring GastrointestinaI: No nausea, vomiting, diarrhea, abdominal pain, fecal incontinence Genitourinary: No dysuria, urinary retention or frequency.  No nocturia. Musculoskeletal: No neck pain, back pain Integumentary: No rash, pruritus, skin lesions Neurological: as above Psychiatric: No depression at this time. Some anxiety Endocrine: No palpitations, diaphoresis, change in appetite, change in weigh or increased thirst Hematologic/Lymphatic: No anemia, purpura,  petechiae. Allergic/Immunologic: No itchy/runny eyes, nasal congestion, recent allergic reactions, rashes  ALLERGIES: Allergies  Allergen Reactions  . Septra [Sulfamethoxazole-Trimethoprim] Rash    HOME MEDICATIONS:  Current Outpatient Medications:  .  AUBAGIO 14 MG TABS, TAKE 1 TABLET BY MOUTH EVERY DAY, Disp: 30 tablet, Rfl: 11 .  busPIRone (BUSPAR) 15 MG tablet, Take 15 mg by mouth 3 (three) times daily. , Disp: , Rfl:  .  chlorthalidone (HYGROTON) 25 MG tablet, Take 12.5 mg by mouth  daily., Disp: , Rfl:  .  cyclobenzaprine (FLEXERIL) 5 MG tablet, Take 1 tablet (5 mg total) by mouth at bedtime as needed., Disp: 30 tablet, Rfl: 11 .  escitalopram (LEXAPRO) 10 MG tablet, TK 1 AND 1/2 TS PO QD, Disp: , Rfl: 3 .  lamoTRIgine (LAMICTAL) 200 MG tablet, Take 400 mg by mouth daily. , Disp: , Rfl:  .  levonorgestrel (MIRENA) 20 MCG/24HR IUD, 1 each by Intrauterine route once., Disp: , Rfl:  .  meloxicam (MOBIC) 15 MG tablet, Take 1 tablet (15 mg total) by mouth daily., Disp: 30 tablet, Rfl: 5 .  Polyvinyl Alcohol-Povidone (REFRESH OP), Place 2 drops into both eyes 4 (four) times daily as needed (dryness)., Disp: , Rfl:  .  loratadine (CLARITIN) 10 MG tablet, Take 10 mg by mouth daily., Disp: , Rfl:  No current facility-administered medications for this visit.   Facility-Administered Medications Ordered in Other Visits:  .  gadopentetate dimeglumine (MAGNEVIST) injection 20 mL, 20 mL, Intravenous, Once PRN, Tongela Encinas A, MD .  gadopentetate dimeglumine (MAGNEVIST) injection 20 mL, 20 mL, Intravenous, Once PRN, Linday Rhodes, Nanine Means, MD  PAST MEDICAL HISTORY: Past Medical History:  Diagnosis Date  . Cervical high risk human papillomavirus (HPV) DNA test positive 09/2015  . Cholecystitis 2006   GALBLADDER REMOVED  . 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 History of mammogram 11/2014   FIBROADENOMA  . History of Papanicolaou smear of cervix 01/24/13; 10/15/15   -/-; -/+  . Hypertension   .  Menorrhagia 02/01/2010   D/C HYSTEROSCOPY ENDO POLYP  . MS (multiple sclerosis) ([place])   . Ovarian cyst 9/15; 2015-2016   LEFT - WENT TO [facility]; RIGHT    PAST SURGICAL HISTORY: Past Surgical History:  Procedure Laterality Date  . BREAST BIOPSY Right 2016   Fibroadenoma  . CHOLECYSTECTOMY  2006  . DILATION AND CURETTAGE, DIAGNOSTIC / THERAPEUTIC  02/01/2010   D/C HYSTEROSCOPY ENDO POLYP; [facility]  . FLEXIBLE SIGMOIDOSCOPY  07/31/2016   Procedure: FLEXIBLE SIGMOIDOSCOPY;  Surgeon: [name], MD;  Location: [facility] END[facility]COPY;  Service: Endoscopy;;  . HYSTEROSCOPY  02/01/2010   ENDO POLYP - [facility]    FAMILY HISTORY: Family History  Problem Relation Age of Onset  . Breast cancer Maternal Aunt 65  . Rheum arthritis Mother   . Hypertension Mother   . Thyroid disease Mother        HYPOTHYROIDISM  . Cancer Mother 19       LUNG  . Heart disease Father   . Diabetes Father   . Hypertension Father   . Cancer Paternal Grandmother        MELANOMA OF SKIN  . Cancer Cousin        KIDNEY-COUSIN    SOCIAL HISTORY:  Social History   Socioeconomic History  . Marital status: Divorced    Spouse name: Not on file  . Number of children: 0  . Years of education: 31  . Highest education level: Not on file  Occupational History    Employer: [employer]  . Financial resource strain: Not on file  . Food insecurity:    Worry: Not on file    Inability: Not on file  . Transportation needs:    Medical: Not on file    Non-medical: Not on file  Tobacco Use  . Smoking status: Former Research scientist (life sciences)  . Smokeless tobacco: Never Used  Substance and Sexual Activity  . Alcohol use: No  . Drug use: No  . Sexual  activity: Yes    Birth control/protection: IUD  Lifestyle  . Physical activity:    Days per week: Not on file    Minutes per session: Not on file  . Stress: Not on file  Relationships  . Social connections:    Talks on phone: Not on file    Gets together: Not on file    Attends religious  service: Not on file    Active member of club or organization: Not on file    Attends meetings of clubs or organizations: Not on file    Relationship status: Not on file  . Intimate partner violence:    Fear of current or ex partner: Not on file    Emotionally abused: Not on file    Physically abused: Not on file    Forced sexual activity: Not on file  Other Topics Concern  . Not on file  Social History Narrative  . Not on file     PHYSICAL EXAM  Vitals:   06/29/17 0822  BP: 114/62  Pulse: 68  Resp: 14  Weight: 220 lb (99.8 kg)  Height: 5\' 4"  (1.626 m)    Body mass index is 37.76 kg/m.   General: The patient is well-developed and well-nourished and in no acute distress.   Small knot/scab posterior scalp on the right from fall.    Neurologic Exam  Mental status: The patient is alert and oriented x 3 at the time of the examination. The patient has apparent normal recent and remote memory, with an apparently normal attention span and concentration ability.   Speech is normal.  Cranial nerves: Extraocular muscles are normal.  Facial strength and sensation is normal.  Trapezius strength is strong.  The tongue is midline, and the patient has symmetric elevation of the soft palate. No obvious hearing deficits are noted.  Motor:  Muscle bulk is normal.   Tone is normal. Strength is  5 / 5 in all 4 extremities.   Sensory: She has intact sensation to touch, temperature and vibration in the arms and legs.  Coordination: Cerebellar testing reveals good finger-nose-finger and heel-to-shin bilaterally.  Gait and station: Station is normal.   Gait is normal.  The tandem gait is mildly wide.  The Romberg is negative.  Reflexes: Deep tendon reflexes are symmetric and normal bilaterally in arms and legs.    No clonus or spread.        DIAGNOSTIC DATA (LABS, IMAGING, TESTING) - I reviewed patient records, labs, notes, testing and imaging myself where available.  Lab Results    Component Value Date   WBC 9.0 04/25/2016   HGB 11.5 (L) 04/25/2016   HCT 36.2 04/25/2016   MCV 85.6 04/25/2016   PLT 307 04/25/2016      Component Value Date/Time   NA 139 04/25/2016 0416   NA 141 02/05/2016 1533   K 3.8 04/25/2016 0416   CL 105 04/25/2016 0416   CO2 28 04/25/2016 0416   GLUCOSE 100 (H) 04/25/2016 0416   BUN 9 04/25/2016 0416   BUN 9 02/05/2016 1533   CREATININE 0.85 04/25/2016 0416   CALCIUM 8.2 (L) 04/25/2016 0416   PROT 7.0 04/22/2016 0352   PROT 6.8 02/05/2016 1533   ALBUMIN 3.8 04/22/2016 0352   ALBUMIN 4.0 02/05/2016 1533   AST 17 04/22/2016 0352   ALT 19 04/22/2016 0352   ALKPHOS 93 04/22/2016 0352   BILITOT 1.5 (H) 04/22/2016 0352   BILITOT 0.5 02/05/2016 1533   GFRNONAA >60 04/25/2016 0416  GFRAA >60 04/25/2016 0416       ASSESSMENT AND PLAN  Multiple sclerosis ([place]) - Plan: Hepatic function panel, CBC with Differential/Platelet  Gait disturbance  Insomnia, unspecified type  Depression with anxiety  Other fatigue   1.    Continue Aubagio.  We will check some blood work today..   2.    She will continue cyclobenzaprine as needed and meloxicam as needed for her back pain. 3.  Continue to be active and exercise as tolerated. 4.   She will continue medications for mood  5.    She will return to see me in 6 months or sooner if there are new or worsening neurologic symptoms.  Around that time, we would likely get another MRI of the brain to determine if she is having subclinical progression.  If this is present, or if she has another relapse, we would need to consider a different disease modifying therapy.   Waleska Buttery A. Felecia Shelling, MD, PhD 08/01/6268, 3:50 AM Certified in Neurology, Clinical Neurophysiology, Sleep Medicine, Pain Medicine and [facility] [address], [place], [place] [address] [phone]

## 2017-06-30 ENCOUNTER — Telehealth: Payer: Self-pay | Admitting: *Deleted

## 2017-06-30 LAB — CBC WITH DIFFERENTIAL/PLATELET
BASOS: 1 %
Basophils Absolute: 0.1 10*3/uL (ref 0.0–0.2)
EOS (ABSOLUTE): 0.2 10*3/uL (ref 0.0–0.4)
EOS: 2 %
HEMATOCRIT: 40.1 % (ref 34.0–46.6)
Hemoglobin: 13.1 g/dL (ref 11.1–15.9)
IMMATURE GRANULOCYTES: 0 %
Immature Grans (Abs): 0 10*3/uL (ref 0.0–0.1)
LYMPHS: 25 %
Lymphocytes Absolute: 1.6 10*3/uL (ref 0.7–3.1)
MCH: 28.4 pg (ref 26.6–33.0)
MCHC: 32.7 g/dL (ref 31.5–35.7)
MCV: 87 fL (ref 79–97)
MONOS ABS: 0.8 10*3/uL (ref 0.1–0.9)
Monocytes: 12 %
Neutrophils Absolute: 3.9 10*3/uL (ref 1.4–7.0)
Neutrophils: 60 %
PLATELETS: 339 10*3/uL (ref 150–379)
RBC: 4.61 x10E6/uL (ref 3.77–5.28)
RDW: 15.6 % — AB (ref 12.3–15.4)
WBC: 6.5 10*3/uL (ref 3.4–10.8)

## 2017-06-30 LAB — HEPATIC FUNCTION PANEL
ALBUMIN: 4.2 g/dL (ref 3.5–5.5)
ALK PHOS: 104 IU/L (ref 39–117)
ALT: 16 IU/L (ref 0–32)
AST: 15 IU/L (ref 0–40)
Bilirubin Total: 0.7 mg/dL (ref 0.0–1.2)
Bilirubin, Direct: 0.3 mg/dL (ref 0.00–0.40)
TOTAL PROTEIN: 6.9 g/dL (ref 6.0–8.5)

## 2017-06-30 NOTE — Telephone Encounter (Signed)
-----   Message from Britt Bottom, MD sent at 06/30/2017  9:34 AM EDT ----- Please let her know that the lab work was fine

## 2017-06-30 NOTE — Telephone Encounter (Signed)
LMOM with below lab results.  She does not need to return this call unless she has questions/fim 

## 2017-07-01 ENCOUNTER — Ambulatory Visit: Payer: BLUE CROSS/BLUE SHIELD | Admitting: Obstetrics and Gynecology

## 2017-07-01 ENCOUNTER — Encounter: Payer: Self-pay | Admitting: Obstetrics and Gynecology

## 2017-07-01 VITALS — BP 140/80 | HR 72 | Ht 64.0 in | Wt 222.0 lb

## 2017-07-01 DIAGNOSIS — R87612 Low grade squamous intraepithelial lesion on cytologic smear of cervix (LGSIL): Secondary | ICD-10-CM | POA: Diagnosis not present

## 2017-07-01 DIAGNOSIS — Z124 Encounter for screening for malignant neoplasm of cervix: Secondary | ICD-10-CM | POA: Diagnosis not present

## 2017-07-01 DIAGNOSIS — Z1151 Encounter for screening for human papillomavirus (HPV): Secondary | ICD-10-CM

## 2017-07-01 NOTE — Patient Instructions (Signed)
I value your feedback and entrusting us with your care. If you get a Sandra Bonilla patient survey, I would appreciate you taking the time to let us know about your experience today. Thank you! 

## 2017-07-01 NOTE — Progress Notes (Signed)
Sandra Marble, MD   Chief Complaint  Patient presents with  . Follow-up    37m pap after colpo    HPI:      Sandra Bonilla is a 42 y.o. G0P0000 who LMP was No LMP recorded (lmp unknown). (Menstrual status: IUD)., presents today for 6 mos repeat pap smear after LGSIL/Pos HPV DNA 10/15/16 with neg colpo bx 12/02/16. No concerns/problems today.    Past Medical History:  Diagnosis Date  . Cervical high risk human papillomavirus (HPV) DNA test positive 09/2015  . Cholecystitis 2006   GALBLADDER REMOVED  . 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 History of mammogram 11/2014   FIBROADENOMA  . History of Papanicolaou smear of cervix 01/24/13; 10/15/15   -/-; -/+  . Hypertension   . Menorrhagia 02/01/2010   D/C HYSTEROSCOPY ENDO POLYP  . MS (multiple sclerosis) ([place])   . Ovarian cyst 9/15; 2015-2016   LEFT - WENT TO [facility]; RIGHT    Past Surgical History:  Procedure Laterality Date  . BREAST BIOPSY Right 2016   Fibroadenoma  . CHOLECYSTECTOMY  2006  . DILATION AND CURETTAGE, DIAGNOSTIC / THERAPEUTIC  02/01/2010   D/C HY[facility]OSCOPY ENDO POLYP; [facility]  . FLEXIBLE SIGMOIDOSCOPY  07/31/2016   Procedure: FLEXIBLE SIGMOIDOSCOPY;  Surgeon: [name], MD;  Location: [facility];  Service: Endoscopy;;  . HYSTEROSCOPY  02/01/2010   ENDO POLYP - [facility]    Family History  Problem Relation Age of Onset  . Breast cancer Maternal Aunt 13  . Rheum arthritis Mother   . Hypertension Mother   . Thyroid disease Mother        HYPOTHYROIDISM  . Cancer Mother 1       LUNG  . Heart disease Father   . Diabetes Father   . Hypertension Father   . Cancer Paternal Grandmother        MELANOMA OF SKIN  . Cancer Cousin        KIDNEY-COUSIN    Social History   Socioeconomic History  . Marital status: Divorced    Spouse name: Not on file  . Number of children: 0  . Years of education: 60  . Highest education level: Not on file  Occupational History    Employer: [employer]  . Financial resource strain: Not on file  . Food insecurity:    Worry: Not on file    Inability: Not on file  . Transportation needs:    Medical: Not on file    Non-medical: Not on file  Tobacco Use  . Smoking status: Former Research scientist (life sciences)  . Smokeless tobacco: Never Used  Substance and Sexual Activity  . Alcohol use: No  . Drug use: No  . Sexual activity: Yes    Birth control/protection: IUD  Lifestyle  . Physical activity:    Days per week: Not on file    Minutes per session: Not on file  . Stress: Not on file  Relationships  . Social connections:    Talks on phone: Not on file    Gets together: Not on file    Attends religious service: Not on file    Active member of club or organization: Not on file    Attends meetings of clubs or organizations: Not on file    Relationship status: Not on file  . Intimate partner violence:    Fear of current or ex partner: Not on file    Emotionally abused: Not on file  Physically abused: Not on file    Forced sexual activity: Not on file  Other Topics Concern  . Not on file  Social History Narrative  . Not on file    Outpatient Medications Prior to Visit  Medication Sig Dispense Refill  . AUBAGIO 14 MG TABS TAKE 1 TABLET BY MOUTH EVERY DAY 30 tablet 11  . busPIRone (BUSPAR) 15 MG tablet Take 15 mg by mouth 3 (three) times daily.     . chlorthalidone (HYGROTON) 25 MG tablet Take 12.5 mg by mouth daily.    . cyclobenzaprine (FLEXERIL) 5 MG tablet Take 1 tablet (5 mg total) by mouth at bedtime as needed. 30 tablet 11  . escitalopram (LEXAPRO) 10 MG tablet TK 1 AND 1/2 TS PO QD  3  . lamoTRIgine (LAMICTAL) 200 MG tablet Take 400 mg by mouth daily.     Marland Kitchen levonorgestrel (MIRENA) 20 MCG/24HR IUD 1 each by Intrauterine route once.    . meloxicam (MOBIC) 15 MG tablet Take 1 tablet (15 mg total) by mouth daily. 30 tablet 5  . ondansetron (ZOFRAN) 4 MG tablet Take 4 mg by mouth every 6 (six) hours as needed.  0  . Polyvinyl  Alcohol-Povidone (REFRESH OP) Place 2 drops into both eyes 4 (four) times daily as needed (dryness).    Marland Kitchen loratadine (CLARITIN) 10 MG tablet Take 10 mg by mouth daily.     Facility-Administered Medications Prior to Visit  Medication Dose Route Frequency Provider Last Rate Last Dose  . gadopentetate dimeglumine (MAGNEVIST) injection 20 mL  20 mL Intravenous Once PRN Sater, Richard A, MD      . gadopentetate dimeglumine (MAGNEVIST) injection 20 mL  20 mL Intravenous Once PRN Sater, Nanine Means, MD          ROS:  Review of Systems  Constitutional: Negative for fever.  Gastrointestinal: Negative for blood in stool, constipation, diarrhea, nausea and vomiting.  Genitourinary: Negative for dyspareunia, dysuria, flank pain, frequency, hematuria, urgency, vaginal bleeding, vaginal discharge and vaginal pain.  Musculoskeletal: Negative for back pain.  Skin: Negative for rash.   BREAST: No symptoms   OBJECTIVE:   Vitals:  BP 140/80   Pulse 72   Ht 5\' 4"  (1.626 m)   Wt 222 lb (100.7 kg)   LMP  (LMP Unknown)   BMI 38.11 kg/m   Physical Exam  Constitutional: She is oriented to person, place, and time and well-developed, well-nourished, and in no distress. Vital signs are normal.  Genitourinary: Vagina normal, uterus normal, cervix normal, right adnexa normal, left adnexa normal and vulva normal. Uterus is not enlarged. Cervix exhibits no motion tenderness and no tenderness. Right adnexum displays no mass and no tenderness. Left adnexum displays no mass and no tenderness. Vulva exhibits no erythema, no exudate, no lesion, no rash and no tenderness. Vagina exhibits no lesion.  Neurological: She is alert and oriented to person, place, and time.  Psychiatric: Memory, affect and judgment normal.  Vitals reviewed.   Assessment/Plan: Cervical cancer screening - 6 mos repeat today. Due for repeats till normal. Will call pt with results. RTO in 5 months for annual/repeat pap - Plan: IGP, Aptima  HPV  Screening for HPV (human papillomavirus) - Plan: IGP, Aptima HPV  LGSIL on Pap smear of cervix - Plan: IGP, Aptima HPV    Return in about 5 months (around 11/01/3823) for annual.  Gavriel Holzhauer B. Jaycen Vercher, PA-C 07/01/2017 9:49 AM

## 2017-07-06 ENCOUNTER — Telehealth: Payer: Self-pay | Admitting: Obstetrics and Gynecology

## 2017-07-06 LAB — IGP, APTIMA HPV
HPV Aptima: NEGATIVE
PAP SMEAR COMMENT: 0

## 2017-07-06 MED ORDER — FLUCONAZOLE 150 MG PO TABS: 150.0000 mg | ORAL_TABLET | Freq: Once | ORAL | 0 refills | Status: AC

## 2017-07-06 NOTE — Telephone Encounter (Signed)
Pt aware of neg pap/neg HPV DNA. Repeat at annual in 5 months. Pt with mild itch, yeast on pap. Rx diflucan. F/u prn.

## 2017-10-21 ENCOUNTER — Ambulatory Visit
Admission: RE | Admit: 2017-10-21 | Discharge: 2017-10-21 | Disposition: A | Discharge status: Home / Self Care | Payer: BLUE CROSS/BLUE SHIELD | Source: Ambulatory Visit | Attending: Obstetrics and Gynecology | Admitting: Obstetrics and Gynecology

## 2017-10-21 DIAGNOSIS — Z1231 Encounter for screening mammogram for malignant neoplasm of breast: Secondary | ICD-10-CM | POA: Diagnosis present

## 2017-10-22 ENCOUNTER — Encounter: Payer: Self-pay | Admitting: Obstetrics and Gynecology

## 2017-10-28 ENCOUNTER — Encounter (INDEPENDENT_AMBULATORY_CARE_PROVIDER_SITE_OTHER): Payer: Self-pay | Admitting: Vascular Surgery

## 2017-10-28 ENCOUNTER — Ambulatory Visit (INDEPENDENT_AMBULATORY_CARE_PROVIDER_SITE_OTHER): Payer: BLUE CROSS/BLUE SHIELD | Admitting: Vascular Surgery

## 2017-10-28 VITALS — BP 124/77 | HR 92 | Resp 13 | Ht 66.0 in | Wt 225.0 lb

## 2017-10-28 DIAGNOSIS — I10 Essential (primary) hypertension: Secondary | ICD-10-CM

## 2017-10-28 DIAGNOSIS — I83813 Varicose veins of bilateral lower extremities with pain: Secondary | ICD-10-CM | POA: Diagnosis not present

## 2017-10-28 NOTE — Progress Notes (Signed)
Subjective:    Patient ID: Sandra Bonilla, female    DOB: 1975-10-29, 42 y.o.   MRN: [id number] Chief Complaint  Patient presents with  . New Patient (Initial Visit)    Varicose Veins   Presents as a new patient referred by Dr. Nehemiah Massed for evaluation of painful varicose veins.  The patient endorses a long-standing history of painful varicosities located to the bilateral legs.  Over the past few months the patient notes an increase in number, size and discomfort to the varicosities.  Patient feels that her symptoms have progressed to the point that she is unable to function on a daily basis and they have become lifestyle limiting.  At this time, the patient has not started to engage in conservative therapy including wearing medical grade 1 compression socks, elevating her legs and remaining active on a daily basis.  The patient denies any recent surgery or trauma to the bilateral lower extremity.  The patient denies any DVT history to the bilateral legs.  The patient denies any recent bouts of cellulitis.  The patient denies any fever, nausea vomiting.  Review of Systems  Constitutional: Negative.   HENT: Negative.   Eyes: Negative.   Respiratory: Negative.   Cardiovascular:       Painful varicose veins  Gastrointestinal: Negative.   Endocrine: Negative.   Genitourinary: Negative.   Musculoskeletal: Negative.   Skin: Negative.   Allergic/Immunologic: Negative.   Neurological: Negative.   Hematological: Negative.   Psychiatric/Behavioral: Negative.       Objective:   Physical Exam  Constitutional: She is oriented to person, place, and time. She appears well-developed and well-nourished. No distress.  HENT:  Head: Normocephalic and atraumatic.  Right Ear: External ear normal.  Left Ear: External ear normal.  Eyes: Pupils are equal, round, and reactive to light. Conjunctivae and EOM are normal.  Neck: Normal range of motion.  Cardiovascular: Normal rate, regular rhythm, normal  heart sounds and intact distal pulses.  Pulses:      Radial pulses are 2+ on the right side, and 2+ on the left side.       Dorsalis pedis pulses are 2+ on the right side, and 2+ on the left side.       Posterior tibial pulses are 2+ on the right side, and 2+ on the left side.  Pulmonary/Chest: Effort normal and breath sounds normal.  Musculoskeletal: Normal range of motion. She exhibits edema (Mild nonpitting bilateral lower extremity edema noted).  Neurological: She is alert and oriented to person, place, and time.  Skin: Skin is warm and dry. She is not diaphoretic.  Less than 1 cm scattered varicosities noted to the bilateral lower extremity.  There is no stasis dermatitis, fibrosis, cellulitis or active ulcerations noted at this time.  Psychiatric: She has a normal mood and affect. Her behavior is normal. Judgment and thought content normal.  Vitals reviewed.  BP 124/77 (BP Location: Right Arm, Patient Position: Sitting)   Pulse 92   Resp 13   Ht 5\' 6"  (1.676 m)   Wt 225 lb (102.1 kg)   BMI 36.32 kg/m   Past Medical History:  Diagnosis Date  . Cervical high risk human papillomavirus (HPV) DNA test positive 09/2015  . Cholecystitis 2006   GALBLADDER REMOVED  . 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 History of mammogram 11/2014   FIBROADENOMA  . History of Papanicolaou smear of cervix 01/24/13; 10/15/15   -/-; -/+  . Hypertension   . Menorrhagia  02/01/2010   D/C HYSTEROSCOPY ENDO POLYP  . MS (multiple sclerosis) ([place])   . Ovarian cyst 9/15; 2015-2016   LEFT - WENT TO [facility]; RIGHT   Social History   Socioeconomic History  . Marital status: Divorced    Spouse name: Not on file  . Number of children: 0  . Years of education: 85  . Highest education level: Not on file  Occupational History    Employer: [employer]  . Financial resource strain: Not on file  . Food insecurity:    Worry: Not on file    Inability: Not on file  . Transportation needs:    Medical:  Not on file    Non-medical: Not on file  Tobacco Use  . Smoking status: Former Research scientist (life sciences)  . Smokeless tobacco: Never Used  Substance and Sexual Activity  . Alcohol use: No  . Drug use: No  . Sexual activity: Yes    Birth control/protection: IUD  Lifestyle  . Physical activity:    Days per week: Not on file    Minutes per session: Not on file  . Stress: Not on file  Relationships  . Social connections:    Talks on phone: Not on file    Gets together: Not on file    Attends religious service: Not on file    Active member of club or organization: Not on file    Attends meetings of clubs or organizations: Not on file    Relationship status: Not on file  . Intimate partner violence:    Fear of current or ex partner: Not on file    Emotionally abused: Not on file    Physically abused: Not on file    Forced sexual activity: Not on file  Other Topics Concern  . Not on file  Social History Narrative  . Not on file   Past Surgical History:  Procedure Laterality Date  . BREAST BIOPSY Right 2016   Fibroadenoma  . CHOLECYSTECTOMY  2006  . DILATION AND CURETTAGE, DIAGNOSTIC / THERAPEUTIC  02/01/2010   D/C HYSTEROSCOPY ENDO POLYP; [facility]  . FLEXIBLE SIGMOIDOSCOPY  07/31/2016   Procedure: FLEXIBLE SIGMOIDOSCOPY;  Surgeon: [name], MD;  Location: [facility];  Service: Endoscopy;;  . HYSTEROSCOPY  02/01/2010   ENDO POLYP - [facility]   Family History  Problem Relation Age of Onset  . Breast cancer Maternal Aunt 35  . Rheum arthritis Mother   . Hypertension Mother   . Thyroid disease Mother        HYPOTHYROIDISM  . Cancer Mother 75       LUNG  . Heart disease Father   . Diabetes Father   . Hypertension Father   . Cancer Paternal Grandmother        MELANOMA OF SKIN  . Cancer Cousin        KIDNEY-COUSIN   Allergies  Allergen Reactions  . Septra [Sulfamethoxazole-Trimethoprim] Rash      Assessment & Plan:  Presents as a new patient referred by Dr. Nehemiah Massed for evaluation of  painful varicose veins.  The patient endorses a long-standing history of painful varicosities located to the bilateral legs.  Over the past few months the patient notes an increase in number, size and discomfort to the varicosities.  Patient feels that her symptoms have progressed to the point that she is unable to function on a daily basis and they have become lifestyle limiting.  At this time, the patient has not started to engage in conservative  therapy including wearing medical grade 1 compression socks, elevating her legs and remaining active on a daily basis.  The patient denies any recent surgery or trauma to the bilateral lower extremity.  The patient denies any DVT history to the bilateral legs.  The patient denies any recent bouts of cellulitis.  The patient denies any fever, nausea vomiting.  1. Varicose veins of bilateral lower extremities with pain - New The patient was encouraged to wear graduated compression stockings (20-30 mmHg) on a daily basis. The patient was instructed to begin wearing the stockings first thing in the morning and removing them in the evening. The patient was instructed specifically not to sleep in the stockings. Prescription given. In addition, behavioral modification including elevation during the day will be initiated. Anti-inflammatories for pain. The patient will follow up in three months to asses conservative management.  Information on compression stockings was given to the patient. The patient was instructed to call the office in the interim if any worsening edema or ulcerations to the legs, feet or toes occurs. The patient expresses their understanding.  2. Essential hypertension - Stable Encouraged good control as its slows the progression of atherosclerotic disease  Current Outpatient Medications on File Prior to Visit  Medication Sig Dispense Refill  . AUBAGIO 14 MG TABS TAKE 1 TABLET BY MOUTH EVERY DAY 30 tablet 11  . busPIRone (BUSPAR) 15 MG tablet  Take 15 mg by mouth 3 (three) times daily.     . chlorthalidone (HYGROTON) 25 MG tablet Take 12.5 mg by mouth daily.    Marland Kitchen CONTRAVE 8-90 MG TB12     . cyclobenzaprine (FLEXERIL) 5 MG tablet Take 1 tablet (5 mg total) by mouth at bedtime as needed. 30 tablet 11  . escitalopram (LEXAPRO) 10 MG tablet TK 1 AND 1/2 TS PO QD  3  . lamoTRIgine (LAMICTAL) 200 MG tablet Take 400 mg by mouth daily.     Marland Kitchen levonorgestrel (MIRENA) 20 MCG/24HR IUD 1 each by Intrauterine route once.    . meloxicam (MOBIC) 15 MG tablet Take 1 tablet (15 mg total) by mouth daily. 30 tablet 5  . metaxalone (SKELAXIN) 800 MG tablet TAKE 1/2  TO 1 TABLET BY MOUTH TID PRN  2  . Polyvinyl Alcohol-Povidone (REFRESH OP) Place 2 drops into both eyes 4 (four) times daily as needed (dryness).    . ondansetron (ZOFRAN) 4 MG tablet Take 4 mg by mouth every 6 (six) hours as needed.  0  . Phendimetrazine Tartrate 105 MG CP24 TK ONE C PO  QAM  0   Current Facility-Administered Medications on File Prior to Visit  Medication Dose Route Frequency Provider Last Rate Last Dose  . gadopentetate dimeglumine (MAGNEVIST) injection 20 mL  20 mL Intravenous Once PRN Sater, Richard A, MD      . gadopentetate dimeglumine (MAGNEVIST) injection 20 mL  20 mL Intravenous Once PRN Sater, Nanine Means, MD       There are no Patient Instructions on file for this visit. No follow-ups on file.  Waunetta Riggle A Pavel Gadd, PA-C

## 2017-12-01 ENCOUNTER — Ambulatory Visit (INDEPENDENT_AMBULATORY_CARE_PROVIDER_SITE_OTHER): Payer: BLUE CROSS/BLUE SHIELD | Admitting: Obstetrics and Gynecology

## 2017-12-01 ENCOUNTER — Encounter: Payer: Self-pay | Admitting: Obstetrics and Gynecology

## 2017-12-01 VITALS — BP 100/70 | Ht 66.0 in | Wt 223.0 lb

## 2017-12-01 DIAGNOSIS — Z1239 Encounter for other screening for malignant neoplasm of breast: Secondary | ICD-10-CM

## 2017-12-01 DIAGNOSIS — Z01419 Encounter for gynecological examination (general) (routine) without abnormal findings: Secondary | ICD-10-CM | POA: Diagnosis not present

## 2017-12-01 DIAGNOSIS — Z30431 Encounter for routine checking of intrauterine contraceptive device: Secondary | ICD-10-CM | POA: Diagnosis not present

## 2017-12-01 DIAGNOSIS — Z1231 Encounter for screening mammogram for malignant neoplasm of breast: Secondary | ICD-10-CM

## 2017-12-01 DIAGNOSIS — Z1151 Encounter for screening for human papillomavirus (HPV): Secondary | ICD-10-CM

## 2017-12-01 DIAGNOSIS — Z124 Encounter for screening for malignant neoplasm of cervix: Secondary | ICD-10-CM

## 2017-12-01 NOTE — Progress Notes (Signed)
Chief Complaint  Patient presents with  . Gynecologic Exam  . [employer] employee     HPI:      [name] is a 42 y.o. G0P0000 who LMP was No LMP recorded. (Menstrual status: IUD)., presents today for her annual examination.  Her menses are absent due to IUD. Dysmenorrhea none. She does have occasional spotting.   Sex activity: not currently. Has Mirena, replaced 11/14/15 Last Pap: 07/01/17 Results were: no abnormalities /neg HPV DNA. 10/15/16 pap was LGSIL/POS HPV DNA. Neg colpo and bx 9/18. Repeat pap done 4/19. She is due for repeat pap today. She is also on med for MS and should have yearly paps.  Hx of STDs: HPV  Last mammogram: 10/21/17 Results were: normal--routine follow-up in 12 months There is a  FH of breast cancer in her mat aunt, genetic testing not indicated. There is no FH of ovarian cancer. The patient does do self-breast exams.  Tobacco use: The patient denies current or previous tobacco use. Alcohol use: none Exercise: moderately active  She does not get adequate calcium and Vitamin D in her diet.  Labs with PCP.  Past Medical History:  Diagnosis Date  . Cervical high risk human papillomavirus (HPV) DNA test positive 09/2015  . Cholecystitis 2006   GALBLADDER REMOVED  . 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 History of mammogram 11/2014   FIBROADENOMA  . History of Papanicolaou smear of cervix 01/24/13; 10/15/15   -/-; -/+  . Hypertension   . Menorrhagia 02/01/2010   D/C HYSTEROSCOPY ENDO POLYP  . MS (multiple sclerosis) ([place])   . Ovarian cyst 9/15; 2015-2016   LEFT - WENT TO [facility]; RIGHT    Past Surgical History:  Procedure Laterality Date  . BREAST BIOPSY Right 2016   Fibroadenoma  . CHOLECYSTECTOMY  2006  . DILATION AND CURETTAGE, DIAGNOSTIC / THERAPEUTIC  02/01/2010   D/C HYSTEROSCOPY ENDO POLYP; [facility]  . FLEXIBLE SIGMOIDOSCOPY  07/31/2016   Procedure: FLEXIBLE SIGMOIDOSCOPY;  Surgeon: [name], MD;  Location: [facility];  Service:  Endoscopy;;  . HYSTEROSCOPY  02/01/2010   ENDO POLYP - [facility]    Family History  Problem Relation Age of Onset  . Breast cancer Maternal Aunt 46  . Rheum arthritis Mother   . Hypertension Mother   . Thyroid disease Mother        HYPOTHYROIDISM  . Cancer Mother 60       LUNG  . Heart disease Father   . Diabetes Father   . Hypertension Father   . Cancer Paternal Grandmother        MELANOMA OF SKIN  . Cancer Cousin        KIDNEY-COUSIN    Social History   Socioeconomic History  . Marital status: Divorced    Spouse name: Not on file  . Number of children: 0  . Years of education: 76  . Highest education level: Not on file  Occupational History    Employer: [employer]  . Financial resource strain: Not on file  . Food insecurity:    Worry: Not on file    Inability: Not on file  . Transportation needs:    Medical: Not on file    Non-medical: Not on file  Tobacco Use  . Smoking status: Former Research scientist (life sciences)  . Smokeless tobacco: Never Used  Substance and Sexual Activity  . Alcohol use: No  . Drug use: No  . Sexual activity: Not Currently    Birth  control/protection: IUD    Comment: Mirena  Lifestyle  . Physical activity:    Days per week: Not on file    Minutes per session: Not on file  . Stress: Not on file  Relationships  . Social connections:    Talks on phone: Not on file    Gets together: Not on file    Attends religious service: Not on file    Active member of club or organization: Not on file    Attends meetings of clubs or organizations: Not on file    Relationship status: Not on file  . Intimate partner violence:    Fear of current or ex partner: Not on file    Emotionally abused: Not on file    Physically abused: Not on file    Forced sexual activity: Not on file  Other Topics Concern  . Not on file  Social History Narrative  . Not on file     Current Outpatient Medications:  .  AUBAGIO 14 MG TABS, TAKE 1 TABLET BY MOUTH EVERY DAY, Disp:  30 tablet, Rfl: 11 .  busPIRone (BUSPAR) 15 MG tablet, Take 15 mg by mouth 3 (three) times daily. , Disp: , Rfl:  .  chlorthalidone (HYGROTON) 25 MG tablet, Take 12.5 mg by mouth daily., Disp: , Rfl:  .  CONTRAVE 8-90 MG TB12, , Disp: , Rfl:  .  cyclobenzaprine (FLEXERIL) 5 MG tablet, Take 1 tablet (5 mg total) by mouth at bedtime as needed., Disp: 30 tablet, Rfl: 11 .  escitalopram (LEXAPRO) 20 MG tablet, TK 1 T PO QD, Disp: , Rfl: 5 .  lamoTRIgine (LAMICTAL) 200 MG tablet, Take 400 mg by mouth daily. , Disp: , Rfl:  .  levonorgestrel (MIRENA) 20 MCG/24HR IUD, 1 each by Intrauterine route once., Disp: , Rfl:  .  meloxicam (MOBIC) 15 MG tablet, Take 1 tablet (15 mg total) by mouth daily., Disp: 30 tablet, Rfl: 5 .  metaxalone (SKELAXIN) 800 MG tablet, TAKE 1/2  TO 1 TABLET BY MOUTH TID PRN, Disp: , Rfl: 2 .  Polyvinyl Alcohol-Povidone (REFRESH OP), Place 2 drops into both eyes 4 (four) times daily as needed (dryness)., Disp: , Rfl:  No current facility-administered medications for this visit.   Facility-Administered Medications Ordered in Other Visits:  .  gadopentetate dimeglumine (MAGNEVIST) injection 20 mL, 20 mL, Intravenous, Once PRN, Sater, Richard A, MD .  gadopentetate dimeglumine (MAGNEVIST) injection 20 mL, 20 mL, Intravenous, Once PRN, Sater, Nanine Means, MD  ROS:  Review of Systems  Constitutional: Negative for fatigue, fever and unexpected weight change.  Respiratory: Negative for cough, shortness of breath and wheezing.   Cardiovascular: Negative for chest pain, palpitations and leg swelling.  Gastrointestinal: Negative for blood in stool, constipation, diarrhea, nausea and vomiting.  Endocrine: Negative for cold intolerance, heat intolerance and polyuria.  Genitourinary: Negative for dyspareunia, dysuria, flank pain, frequency, genital sores, hematuria, menstrual problem, pelvic pain, urgency, vaginal bleeding, vaginal discharge and vaginal pain.  Musculoskeletal: Positive for  myalgias. Negative for arthralgias, back pain and joint swelling.  Skin: Negative for rash.  Neurological: Positive for numbness. Negative for dizziness, syncope, light-headedness and headaches.  Hematological: Negative for adenopathy.  Psychiatric/Behavioral: Positive for agitation and dysphoric mood. Negative for confusion, sleep disturbance and suicidal ideas. The patient is not nervous/anxious.      Objective: BP 100/70   Ht 5\' 6"  (1.676 m)   Wt 223 lb (101.2 kg)   BMI 35.99 kg/m    Physical Exam  Constitutional: She is oriented to person, place, and time. She appears well-developed and well-nourished.  Genitourinary: Vagina normal and uterus normal. There is no rash or tenderness on the right labia. There is no rash or tenderness on the left labia. No erythema or tenderness in the vagina. No vaginal discharge found. Right adnexum does not display mass and does not display tenderness. Left adnexum does not display mass and does not display tenderness.  Cervix exhibits visible IUD strings. Cervix does not exhibit motion tenderness or polyp. Uterus is not enlarged or tender.  Neck: Normal range of motion. No thyromegaly present.  Cardiovascular: Normal rate, regular rhythm and normal heart sounds.  No murmur heard. Pulmonary/Chest: Effort normal and breath sounds normal. Right breast exhibits no mass, no nipple discharge, no skin change and no tenderness. Left breast exhibits no mass, no nipple discharge, no skin change and no tenderness.  Abdominal: Soft. There is no tenderness. There is no guarding.  Musculoskeletal: Normal range of motion.  Neurological: She is alert and oriented to person, place, and time. No cranial nerve deficit.  Psychiatric: She has a normal mood and affect. Her behavior is normal.  Vitals reviewed.    Assessment/Plan: Encounter for annual routine gynecological examination  Cervical cancer screening - Plan: IGP, Aptima HPV  Screening for HPV (human  papillomavirus) - Plan: IGP, Aptima HPV  Encounter for routine checking of intrauterine contraceptive device (IUD) - IUD in place.   Screening for breast cancer - Pt current on mammo.             GYN counsel mammography screening, adequate intake of calcium and vitamin D, diet and exercise     F/U  Return in about 1 year (around 12/02/2018).  Breanna Shorkey B. Lasandra Batley, PA-C 12/01/2017 9:11 AM

## 2017-12-01 NOTE — Patient Instructions (Signed)
I value your feedback and entrusting us with your care. If you get a [facility] patient survey, I would appreciate you taking the time to let us know about your experience today. Thank you! 

## 2017-12-09 LAB — IGP, APTIMA HPV
HPV APTIMA: NEGATIVE
PAP Smear Comment: 0

## 2017-12-10 ENCOUNTER — Telehealth: Payer: Self-pay | Admitting: Obstetrics and Gynecology

## 2017-12-10 NOTE — Telephone Encounter (Signed)
Patient is calling for labs results. Please advise. 

## 2017-12-14 NOTE — Telephone Encounter (Signed)
12/11/17 LM with results. F/u prn. Repeat pap next yr. OTC yeast meds prn

## 2017-12-18 ENCOUNTER — Other Ambulatory Visit (INDEPENDENT_AMBULATORY_CARE_PROVIDER_SITE_OTHER): Payer: Self-pay | Admitting: Vascular Surgery

## 2017-12-18 DIAGNOSIS — I83813 Varicose veins of bilateral lower extremities with pain: Secondary | ICD-10-CM

## 2017-12-21 ENCOUNTER — Ambulatory Visit (INDEPENDENT_AMBULATORY_CARE_PROVIDER_SITE_OTHER): Payer: BLUE CROSS/BLUE SHIELD | Admitting: Vascular Surgery

## 2017-12-21 ENCOUNTER — Encounter (INDEPENDENT_AMBULATORY_CARE_PROVIDER_SITE_OTHER): Payer: BLUE CROSS/BLUE SHIELD

## 2017-12-30 ENCOUNTER — Encounter: Payer: Self-pay | Admitting: Neurology

## 2017-12-30 ENCOUNTER — Ambulatory Visit (INDEPENDENT_AMBULATORY_CARE_PROVIDER_SITE_OTHER): Payer: BLUE CROSS/BLUE SHIELD | Admitting: Neurology

## 2017-12-30 ENCOUNTER — Telehealth: Payer: Self-pay | Admitting: Neurology

## 2017-12-30 VITALS — BP 118/82 | HR 76 | Resp 16 | Ht 66.0 in | Wt 222.5 lb

## 2017-12-30 DIAGNOSIS — F418 Other specified anxiety disorders: Secondary | ICD-10-CM

## 2017-12-30 DIAGNOSIS — R269 Unspecified abnormalities of gait and mobility: Secondary | ICD-10-CM | POA: Diagnosis not present

## 2017-12-30 DIAGNOSIS — R39198 Other difficulties with micturition: Secondary | ICD-10-CM | POA: Insufficient documentation

## 2017-12-30 DIAGNOSIS — R5383 Other fatigue: Secondary | ICD-10-CM

## 2017-12-30 DIAGNOSIS — G35 Multiple sclerosis: Secondary | ICD-10-CM

## 2017-12-30 MED ORDER — OXYBUTYNIN CHLORIDE 5 MG PO TABS: 5.0000 mg | ORAL_TABLET | Freq: Two times a day (BID) | ORAL | 11 refills | Status: DC

## 2017-12-30 NOTE — Progress Notes (Signed)
[facility]  PATIENT: Sandra Bonilla DOB: [birth year]  REFERRING DOCTOR OR PCP:   Dorisann Frames SOURCE: patient and medical records  _________________________________   HISTORICAL  CHIEF COMPLAINT:  Chief Complaint  Patient presents with  . Multiple Sclerosis    Sts. she continues to tolerate Aubagio well.  Sts. PT helped back pain. It is improved--has good days than bad/fim  . Back Pain    HISTORY OF PRESENT ILLNESS:  Sandra Bonilla is a 42 y.o. 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      Update 12/30/2017: For the most part, her MS is stable on Aubagio.,    She tolerates it well.    Her main problem is gait but she feels off balanced, pushed to the right.    She denies weakness or spasticity.   She has no numbness or dysesthesia.    She has urinary urgency with occasional urge incontinence.   She has 3 - 4 times nocturia.     Vision is doing well (new bifocals).  She notes some fatigue but she accomplish what she needs to do.   Sleep is doing well except nocturia and she usually falls back asleep well.     She notes occasional verbal processing, esp if more than one person speaks.    She has mils anxiety but no depression.     For LBP (doing better) she just takes Flexeril and meloxicam as needed.   Last MRI 07/02/2016 was stable.     Update 06/29/2017: She feels her MS is mostly stable. She tolerates Aubagio well.   The last MRI 06/2016 was stable.  She is sometimes veering to the left when she walks.   No vertigo.   Her strength is normal though her right leg sometimes feels like it will give out.   She denies numbness or dysesthesia.   Vision is ok (needs reading glasses).   No diplopia.      She has some nocturia (x2).     Fatigue varies and she feels she gets tired easily.    She sleeps well most nights.   She denies depression but notes some anxiety.     She feels processing os sometimes slow with language (asks people to repeat) but no major  problems.  Migraines are doing well --- no bad ones recently.   She also has left lower back pain without radiation.  She tries to exercise at the gym twice weekly.  Update 12/26/2016:    She feels her MS is stable and she tolerates Aubagio well.   She denies exacerbations but her gait is mildly off balanced as before.   She denies weakness or numbness.   She sometimes has vertigo but no falls.      Bladder is fine.   Vision is mildly blurry but better with eye drops.   She reports fatigue most days.    She has insomnia, sleep maintenance > sleep onset.      Migraines are doing well, only occurring once most months.   Ibuprofen helps relieve pain.  She has had LBP x 2 months, pain is L > R and does not radiate into her legs.    Pain does not change with positions or activities.   She denies numbness/weakness in hr legs.    Ibuprofen helps the pain some.    She takes it once , usually at night.     _____________________________________ From 06/25/2016 MS:  She is on Aubagio tolerates it well.  She denies any MS exacerbation.  Although she feels her MS is mostly stable, she notes she has a little more trouble understanding complicated tasks and people at work need to slow down and repeat task instructions.   MRI of the brain 04/25/2015 showed lesions in the white matter consistent with multiple sclerosis and MRI of the cervical spine that day showed multiple lesions in the upper cervical spinal cord but no acute findings.  There was no change when compared to 12/25/2013 MRI.     She remains on Aubagio since 2013 and is tolerating it well.   She brought in some MRIs of the brain and spinal cord from 2006. I compare that with her 2017 MRI. The MRI of the cervical spine looks essentially unchanged. The MRI of the brain shows that there has been some lesions over the ten-year difference though majorityof the lesions were present on both MRIs.            .  Gait/strength/sensation:  She has mildly off balanced  gait and veers to the right.   This is slightly worse over the past year.     Her left arm tingling improved and now is very intermittent.   Left grip is slightly worse than right (is left handed) Bladder/Bowel:   She denies any bladder or bowel difficulties.   She has nocturia x 1 some nights.    Vision:   She notes reduced VA with reading and has been prescribed reading glasses.   She had Lasik in the past.    No h/o optic neuritis or dipo[pia.     Fatigue/sleep: She has physical and cognitive fatigue, some days worse than others.  This is stable  . She has sleep maintenance insomnia.Imipramine helps with sleep onset and sleep maintenance.    She snores but no witnessed OSA and notes EDS.   Migraines:   These improved and she has had only one since the last visit.  Imipramine has helped.   When present, they are unilateral but can be either side.   Tylenol helps some.     She someimes has blurry vision but no aura.     Mood/cognition: She notes mild depression and mild anxiety.     She feels better.  nxiety is also better    She is currently on BuSpar 15 mg po tid and lamotrigine 200 mg po qd.  She has not noted major problems with cognition. Occasionally she has some issue with parallel processing. She notes she has a little more trouble understanding complicated tasks and people at work need to slow down and repeat task instructions.. She does not have any major problem with memory or word finding.  MS HIstory:       About  2006, she noted gait changes and her right foot was clumsy.    She had an MRI consistent with MS and was referred to Dr. Jacolyn Reedy [facility]) who diagnosed her with MS.   She then saw Dr. Felipe Drone and then Dr. Deloria Lair at [facility].  She was reluctant to take a medication until  2012 years ago when she started Copaxone.   She had skin reactions, she stopped after one year and switched to Aubagio (+/- late 2013).   She has tolerated it well.     REVIEW OF SYSTEMS: Constitutional: No  fevers, chills, sweats, or change in appetite   She notes fatigue and poor sleep.    Eyes: No visual changes, double vision, eye pain Ear, nose  and throat: No hearing loss, ear pain, nasal congestion, sore throat Cardiovascular: No chest pain, palpitations Respiratory: No shortness of breath at rest or with exertion.   No wheezes.  Some snoring GastrointestinaI: No nausea, vomiting, diarrhea, abdominal pain, fecal incontinence Genitourinary: No dysuria, urinary retention or frequency.  No nocturia. Musculoskeletal: No neck pain, back pain Integumentary: No rash, pruritus, skin lesions Neurological: as above Psychiatric: No depression at this time. Some anxiety Endocrine: No palpitations, diaphoresis, change in appetite, change in weigh or increased thirst Hematologic/Lymphatic: No anemia, purpura, petechiae. Allergic/Immunologic: No itchy/runny eyes, nasal congestion, recent allergic reactions, rashes  ALLERGIES: Allergies  Allergen Reactions  . Septra [Sulfamethoxazole-Trimethoprim] Rash    HOME MEDICATIONS:  Current Outpatient Medications:  .  AUBAGIO 14 MG TABS, TAKE 1 TABLET BY MOUTH EVERY DAY, Disp: 30 tablet, Rfl: 11 .  busPIRone (BUSPAR) 15 MG tablet, Take 15 mg by mouth 3 (three) times daily. , Disp: , Rfl:  .  chlorthalidone (HYGROTON) 25 MG tablet, Take 12.5 mg by mouth daily., Disp: , Rfl:  .  cyclobenzaprine (FLEXERIL) 5 MG tablet, Take 1 tablet (5 mg total) by mouth at bedtime as needed., Disp: 30 tablet, Rfl: 11 .  escitalopram (LEXAPRO) 20 MG tablet, TK 1 T PO QD, Disp: , Rfl: 5 .  lamoTRIgine (LAMICTAL) 200 MG tablet, Take 400 mg by mouth daily. , Disp: , Rfl:  .  levonorgestrel (MIRENA) 20 MCG/24HR IUD, 1 each by Intrauterine route once., Disp: , Rfl:  .  meloxicam (MOBIC) 15 MG tablet, Take 1 tablet (15 mg total) by mouth daily., Disp: 30 tablet, Rfl: 5 .  metaxalone (SKELAXIN) 800 MG tablet, TAKE 1/2  TO 1 TABLET BY MOUTH TID PRN, Disp: , Rfl: 2 .  Polyvinyl  Alcohol-Povidone (REFRESH OP), Place 2 drops into both eyes 4 (four) times daily as needed (dryness)., Disp: , Rfl:  .  oxybutynin (DITROPAN) 5 MG tablet, Take 1 tablet (5 mg total) by mouth 2 (two) times daily., Disp: 60 tablet, Rfl: 11 No current facility-administered medications for this visit.   Facility-Administered Medications Ordered in Other Visits:  .  gadopentetate dimeglumine (MAGNEVIST) injection 20 mL, 20 mL, Intravenous, Once PRN, Sater, Richard A, MD .  gadopentetate dimeglumine (MAGNEVIST) injection 20 mL, 20 mL, Intravenous, Once PRN, Sater, Nanine Means, MD  PAST MEDICAL HISTORY: Past Medical History:  Diagnosis Date  . Cervical high risk human papillomavirus (HPV) DNA test positive 09/2015  . Cholecystitis 2006   GALBLADDER REMOVED  . 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 History of mammogram 11/2014   FIBROADENOMA  . History of Papanicolaou smear of cervix 01/24/13; 10/15/15   -/-; -/+  . Hypertension   . Menorrhagia 02/01/2010   D/C HYSTEROSCOPY ENDO POLYP  . MS (multiple sclerosis) ([place])   . Ovarian cyst 9/15; 2015-2016   LEFT - WENT TO [place]; RIGHT    PAST SURGICAL HISTORY: Past Surgical History:  Procedure Laterality Date  . BREAST BIOPSY Right 2016   Fibroadenoma  . CHOLECYSTECTOMY  2006  . DILATION AND CURETTAGE, DIAGNOSTIC / THERAPEUTIC  02/01/2010   D/C HYSTEROSCOPY ENDO POLYP; [facility]  . FLEXIBLE SIGMOIDOSCOPY  07/31/2016   Procedure: FLEXIBLE SIGMOIDOSCOPY;  Surgeon: [name], MD;  Location: [facility];  Service: Endoscopy;;  . HYSTEROSCOPY  02/01/2010   ENDO POLYP - [facility]    FAMILY HISTORY: Family History  Problem Relation Age of Onset  . Breast cancer Maternal Aunt 32  . Rheum arthritis Mother   . Hypertension Mother   .  Thyroid disease Mother        HYPOTHYROIDISM  . Cancer Mother 71       LUNG  . Heart disease Father   . Diabetes Father   . Hypertension Father   . Cancer Paternal Grandmother        MELANOMA OF SKIN  . Cancer Cousin          KIDNEY-COUSIN    SOCIAL HISTORY:  Social History   Socioeconomic History  . Marital status: Divorced    Spouse name: Not on file  . Number of children: 0  . Years of education: 56  . Highest education level: Not on file  Occupational History    Employer: [employer]  . Financial resource strain: Not on file  . Food insecurity:    Worry: Not on file    Inability: Not on file  . Transportation needs:    Medical: Not on file    Non-medical: Not on file  Tobacco Use  . Smoking status: Former Research scientist (life sciences)  . Smokeless tobacco: Never Used  Substance and Sexual Activity  . Alcohol use: No  . Drug use: No  . Sexual activity: Not Currently    Birth control/protection: IUD    Comment: Mirena  Lifestyle  . Physical activity:    Days per week: Not on file    Minutes per session: Not on file  . Stress: Not on file  Relationships  . Social connections:    Talks on phone: Not on file    Gets together: Not on file    Attends religious service: Not on file    Active member of club or organization: Not on file    Attends meetings of clubs or organizations: Not on file    Relationship status: Not on file  . Intimate partner violence:    Fear of current or ex partner: Not on file    Emotionally abused: Not on file    Physically abused: Not on file    Forced sexual activity: Not on file  Other Topics Concern  . Not on file  Social History Narrative  . Not on file     PHYSICAL EXAM  Vitals:   12/30/17 0823  BP: 118/82  Pulse: 76  Resp: 16  Weight: 222 lb 8 oz (100.9 kg)  Height: 5\' 6"  (1.676 m)    Body mass index is 35.91 kg/m.   General: The patient is well-developed and well-nourished and in no acute distress.   Small knot/scab posterior scalp on the right from fall.    Neurologic Exam  Mental status: The patient is alert and oriented x 3 at the time of the examination. The patient has apparent normal recent and remote memory, with an apparently  normal attention span and concentration ability.   Speech is normal.  Cranial nerves: Extraocular muscles are normal.  Facial strength and sensation are normal.   Trapezius is strong   The tongue is midline, and the patient has symmetric elevation of the soft palate. No obvious hearing deficits are noted.  Motor:  Muscle bulk is normal.   Tone is normal. Strength is  5 / 5 in all 4 extremities.   Sensory: She has intact sensation to touch and vibration in the arms and legs.  Coordination: Cerebellar testing reveals good finger-nose-finger and heel-to-shin bilaterally.  Gait and station: Station is normal.   Gait is normal.  The tandem gait is minimally wide.  The Romberg is negative.  Reflexes: Deep  tendon reflexes are symmetric and normal bilaterally in arms and legs.    No clonus or spread.        DIAGNOSTIC DATA (LABS, IMAGING, TESTING) - I reviewed patient records, labs, notes, testing and imaging myself where available.  Lab Results  Component Value Date   WBC 6.5 06/29/2017   HGB 13.1 06/29/2017   HCT 40.1 06/29/2017   MCV 87 06/29/2017   PLT 339 06/29/2017      Component Value Date/Time   NA 139 04/25/2016 0416   NA 141 02/05/2016 1533   K 3.8 04/25/2016 0416   CL 105 04/25/2016 0416   CO2 28 04/25/2016 0416   GLUCOSE 100 (H) 04/25/2016 0416   BUN 9 04/25/2016 0416   BUN 9 02/05/2016 1533   CREATININE 0.85 04/25/2016 0416   CALCIUM 8.2 (L) 04/25/2016 0416   PROT 6.9 06/29/2017 0845   ALBUMIN 4.2 06/29/2017 0845   AST 15 06/29/2017 0845   ALT 16 06/29/2017 0845   ALKPHOS 104 06/29/2017 0845   BILITOT 0.7 06/29/2017 0845   GFRNONAA >60 04/25/2016 0416   GFRAA >60 04/25/2016 0416       ASSESSMENT AND PLAN  Multiple sclerosis (HCC) - Plan: MR BRAIN W WO CONTRAST, CBC with Differential/Platelet, Hepatic function panel  Other fatigue  Depression with anxiety  Gait disturbance  Urinary dysfunction   1.    Continue Aubagio.  Check some blood work  today.   MRI of the brain with and without contrast to determine if any subclinical activity.   If present, consider a change in DMT's 2.   Add oxybutynin for bladder.  Continue other medications.  3.   Continue to be active and exercise as tolerated. 4.   She will continue medications for mood  5.    She will return to see me in 6 months or sooner if there are new or worsening neurologic symptoms.     Richard A. Felecia Shelling, MD, PhD 17/11/1503, 6:97 AM Certified in Neurology, Clinical Neurophysiology, Sleep Medicine, Pain Medicine and Neuroimaging  [facility] [address] [place],  [address] [phone]

## 2017-12-30 NOTE — Telephone Encounter (Signed)
[employer] [place] pending starting a Case w/NIA. Its been approved I just have to wait on the eligibility w/NIA

## 2017-12-31 LAB — HEPATIC FUNCTION PANEL
ALT: 13 IU/L (ref 0–32)
AST: 15 IU/L (ref 0–40)
Albumin: 4.2 g/dL (ref 3.5–5.5)
Alkaline Phosphatase: 101 IU/L (ref 39–117)
Bilirubin Total: 0.7 mg/dL (ref 0.0–1.2)
Bilirubin, Direct: 0.29 mg/dL (ref 0.00–0.40)
TOTAL PROTEIN: 6.6 g/dL (ref 6.0–8.5)

## 2017-12-31 LAB — CBC WITH DIFFERENTIAL/PLATELET
BASOS ABS: 0.1 10*3/uL (ref 0.0–0.2)
Basos: 1 %
EOS (ABSOLUTE): 0.1 10*3/uL (ref 0.0–0.4)
Eos: 2 %
HEMOGLOBIN: 12.2 g/dL (ref 11.1–15.9)
Hematocrit: 38.3 % (ref 34.0–46.6)
Immature Grans (Abs): 0 10*3/uL (ref 0.0–0.1)
Immature Granulocytes: 1 %
LYMPHS ABS: 1.8 10*3/uL (ref 0.7–3.1)
Lymphs: 26 %
MCH: 27.4 pg (ref 26.6–33.0)
MCHC: 31.9 g/dL (ref 31.5–35.7)
MCV: 86 fL (ref 79–97)
MONOCYTES: 12 %
MONOS ABS: 0.8 10*3/uL (ref 0.1–0.9)
Neutrophils Absolute: 4.1 10*3/uL (ref 1.4–7.0)
Neutrophils: 58 %
PLATELETS: 347 10*3/uL (ref 150–450)
RBC: 4.45 x10E6/uL (ref 3.77–5.28)
RDW: 14.7 % (ref 12.3–15.4)
WBC: 6.9 10*3/uL (ref 3.4–10.8)

## 2017-12-31 NOTE — Telephone Encounter (Signed)
-----   Message from Britt Bottom, MD sent at 12/31/2017  4:30 PM EDT ----- Please let the patient know that the lab work is fine.

## 2017-12-31 NOTE — Telephone Encounter (Signed)
LMOM that lab work done in our office is fine.  She does not need to return this call unless she has questions/fim

## 2018-01-04 NOTE — Telephone Encounter (Signed)
NIA does not do the authorization for the MRI for this [employer] member.  I called [employer] provider line and spoke to Salomon Mast at 9:21 Am on 01/04/18 and she informed me there is no authorization for the MRI. And to use her name as a reference number.  I did leave a voicemail for patient to call me back about scheduling mri.

## 2018-01-12 NOTE — Telephone Encounter (Signed)
Patient returned my call she is scheduled for 01/20/18 at [facility].

## 2018-01-20 ENCOUNTER — Ambulatory Visit: Payer: BLUE CROSS/BLUE SHIELD

## 2018-01-20 DIAGNOSIS — G35 Multiple sclerosis: Secondary | ICD-10-CM | POA: Diagnosis not present

## 2018-01-20 MED ORDER — GADOBENATE DIMEGLUMINE 529 MG/ML IV SOLN
20.0000 mL | Freq: Once | INTRAVENOUS | Status: AC | PRN
  Administered 2018-01-20: 20 mL via INTRAVENOUS

## 2018-01-25 ENCOUNTER — Telehealth: Payer: Self-pay

## 2018-01-25 NOTE — Telephone Encounter (Signed)
I called pt, left a message on her cell VM, per DPR, that her MRI did not show any new MS lesions, per Dr. Felecia Shelling. I asked pt to call us back with any questions or concerns.

## 2018-01-25 NOTE — Telephone Encounter (Signed)
-----   Message from Britt Bottom, MD sent at 01/22/2018  2:33 PM EDT ----- Please let the patient know that the MRI of the brain does not show any new MS lesions

## 2018-03-22 ENCOUNTER — Telehealth: Payer: Self-pay | Admitting: *Deleted

## 2018-03-22 NOTE — Telephone Encounter (Signed)
Called, LVM (ok per DPR) letting her know [facility] (specialty pharmacy) has been trying to reach her to schedule shipment for Aubagio. Asked her to call them back at [phone].

## 2018-03-31 DIAGNOSIS — K5720 Diverticulitis of large intestine with perforation and abscess without bleeding: Secondary | ICD-10-CM

## 2018-03-31 HISTORY — DX: Diverticulitis of large intestine with perforation and abscess without bleeding: K57.20

## 2018-04-12 ENCOUNTER — Other Ambulatory Visit: Payer: Self-pay | Admitting: *Deleted

## 2018-04-12 MED ORDER — TERIFLUNOMIDE 14 MG PO TABS: 1.0000 | ORAL_TABLET | Freq: Every day | ORAL | 11 refills | Status: DC

## 2018-06-22 ENCOUNTER — Telehealth: Payer: Self-pay | Admitting: *Deleted

## 2018-06-22 NOTE — Telephone Encounter (Signed)
Called, LVM for pt to call back and discuss appt tomorrow. Was going to offer telephone visit with Dr. Felecia Shelling instead d/t concerns with coronavirus.

## 2018-06-22 NOTE — Telephone Encounter (Signed)
Pt does not consent to a telephone visit. She has r/s to June 8th.

## 2018-06-22 NOTE — Telephone Encounter (Signed)
Dr. Sater- FYI 

## 2018-06-23 ENCOUNTER — Ambulatory Visit: Payer: BLUE CROSS/BLUE SHIELD | Admitting: Neurology

## 2018-07-01 ENCOUNTER — Ambulatory Visit: Payer: BLUE CROSS/BLUE SHIELD | Admitting: Neurology

## 2018-09-02 ENCOUNTER — Telehealth: Payer: Self-pay | Admitting: *Deleted

## 2018-09-02 NOTE — Telephone Encounter (Signed)
Called and spoke with pt. She would like to come to office for appt. Explained new check in process and asked her to wear mask to appt. She passed covid-19 screening. I updated med list, pharmacy, allergies on file.

## 2018-09-06 ENCOUNTER — Ambulatory Visit (INDEPENDENT_AMBULATORY_CARE_PROVIDER_SITE_OTHER): Payer: BC Managed Care – PPO | Admitting: Neurology

## 2018-09-06 ENCOUNTER — Other Ambulatory Visit: Payer: Self-pay

## 2018-09-06 ENCOUNTER — Encounter: Payer: Self-pay | Admitting: Neurology

## 2018-09-06 VITALS — BP 118/70 | HR 74 | Temp 96.8°F | Ht 66.0 in | Wt 231.5 lb

## 2018-09-06 DIAGNOSIS — E559 Vitamin D deficiency, unspecified: Secondary | ICD-10-CM

## 2018-09-06 DIAGNOSIS — G35 Multiple sclerosis: Secondary | ICD-10-CM | POA: Diagnosis not present

## 2018-09-06 DIAGNOSIS — R208 Other disturbances of skin sensation: Secondary | ICD-10-CM | POA: Diagnosis not present

## 2018-09-06 DIAGNOSIS — R39198 Other difficulties with micturition: Secondary | ICD-10-CM | POA: Diagnosis not present

## 2018-09-06 DIAGNOSIS — F418 Other specified anxiety disorders: Secondary | ICD-10-CM | POA: Diagnosis not present

## 2018-09-06 DIAGNOSIS — R269 Unspecified abnormalities of gait and mobility: Secondary | ICD-10-CM

## 2018-09-06 DIAGNOSIS — R5383 Other fatigue: Secondary | ICD-10-CM

## 2018-09-06 NOTE — Progress Notes (Signed)
[facility]  PATIENT: Sandra Bonilla DOB: 1975-12-18  REFERRING DOCTOR OR PCP:   Dorisann Frames SOURCE: patient and medical records  _________________________________   HISTORICAL  CHIEF COMPLAINT:  Chief Complaint  Patient presents with  . Follow-up    RM 12, alone. Last seen 12/30/2017.  . Multiple Sclerosis    On Aubagio    HISTORY OF PRESENT ILLNESS:  Sandra Bonilla is a 43 y.o. 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     Update 09/06/2018: Her MS is stable and she has no exacerbation.   She has had more pain and tingling in the legs since the temperatures increased.  She also notes that her verbal comprehension seems worse though visual comprehension is the same.    Gait is doing the same.  Balance is a little off.   No falls..  No numbness but she has tingling in both thighs that fluctuates.     Bladder is doing well.   Vision is doing well and she notes no asymmetry in colors  She feels fatigue is worse, especially in heat.    She will take a nap on the weekend but not weekdays.   She notes mild anxiety with some of the work/life changes.  Lamotrigine and escitalopram  helps mood.   She sleeps well at night.   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    She has LBP that fluctuates with activity.   Moving/shifting helps reduce the pain when present.   She takes flexeril and meloxicam prn with some benefit.     Update 12/30/2017: For the most part, her MS is stable on Aubagio.,    She tolerates it well.    Her main problem is gait but she feels off balanced, pushed to the right.    She denies weakness or spasticity.   She has no numbness or dysesthesia.    She has urinary urgency with occasional urge incontinence.   She has 3 - 4 times nocturia.     Vision is doing well (new bifocals).  She notes some fatigue but she accomplish what she needs to do.   Sleep is doing well except nocturia and she usually falls back  asleep well.     She notes occasional verbal processing, esp if more than one person speaks.    She has mils anxiety but no depression.     For LBP (doing better) she just takes Flexeril and meloxicam as needed.   Last MRI 07/02/2016 was stable.     Update 06/29/2017: She feels her MS is mostly stable. She tolerates Aubagio well.   The last MRI 06/2016 was stable.  She is sometimes veering to the left when she walks.   No vertigo.   Her strength is normal though her right leg sometimes feels like it will give out.   She denies numbness or dysesthesia.   Vision is ok (needs reading glasses).   No diplopia.      She has some nocturia (x2).     Fatigue varies and she feels she gets tired easily.    She sleeps well most nights.   She denies depression but notes some anxiety.     She feels processing os sometimes slow with language (asks people to repeat) but no major problems.  Migraines are doing well --- no bad ones recently.   She also has left lower back pain without radiation.  She tries to  exercise at the gym twice weekly.  Update 12/26/2016:    She feels her MS is stable and she tolerates Aubagio well.   She denies exacerbations but her gait is mildly off balanced as before.   She denies weakness or numbness.   She sometimes has vertigo but no falls.      Bladder is fine.   Vision is mildly blurry but better with eye drops.   She reports fatigue most days.    She has insomnia, sleep maintenance > sleep onset.      Migraines are doing well, only occurring once most months.   Ibuprofen helps relieve pain.  She has had LBP x 2 months, pain is L > R and does not radiate into her legs.    Pain does not change with positions or activities.   She denies numbness/weakness in hr legs.    Ibuprofen helps the pain some.    She takes it once , usually at night.     _____________________________________ From 06/25/2016 MS:  She is on Aubagio tolerates it well. She denies any MS exacerbation.  Although she feels her  MS is mostly stable, she notes she has a little more trouble understanding complicated tasks and people at work need to slow down and repeat task instructions.   MRI of the brain 04/25/2015 showed lesions in the white matter consistent with multiple sclerosis and MRI of the cervical spine that day showed multiple lesions in the upper cervical spinal cord but no acute findings.  There was no change when compared to 12/25/2013 MRI.     She remains on Aubagio since 2013 and is tolerating it well.   She brought in some MRIs of the brain and spinal cord from 2006. I compare that with her 2017 MRI. The MRI of the cervical spine looks essentially unchanged. The MRI of the brain shows that there has been some lesions over the ten-year difference though majorityof the lesions were present on both MRIs.            .  Gait/strength/sensation:  She has mildly off balanced gait and veers to the right.   This is slightly worse over the past year.     Her left arm tingling improved and now is very intermittent.   Left grip is slightly worse than right (is left handed) Bladder/Bowel:   She denies any bladder or bowel difficulties.   She has nocturia x 1 some nights.    Vision:   She notes reduced VA with reading and has been prescribed reading glasses.   She had Lasik in the past.    No h/o optic neuritis or dipo[pia.     Fatigue/sleep: She has physical and cognitive fatigue, some days worse than others.  This is stable  . She has sleep maintenance insomnia.Imipramine helps with sleep onset and sleep maintenance.    She snores but no witnessed OSA and notes EDS.   Migraines:   These improved and she has had only one since the last visit.  Imipramine has helped.   When present, they are unilateral but can be either side.   Tylenol helps some.     She someimes has blurry vision but no aura.     Mood/cognition: She notes mild depression and mild anxiety.     She feels better.  nxiety is also better    She is currently on  BuSpar 15 mg po tid and lamotrigine 200 mg po qd.  She has not  noted major problems with cognition. Occasionally she has some issue with parallel processing. She notes she has a little more trouble understanding complicated tasks and people at work need to slow down and repeat task instructions.. She does not have any major problem with memory or word finding.  MS HIstory:       About  2006, she noted gait changes and her right foot was clumsy.    She had an MRI consistent with MS and was referred to Dr. Jacolyn Reedy [facility]) who diagnosed her with MS.   She then saw Dr. Felipe Drone and then Dr. Deloria Lair at [facility].  She was reluctant to take a medication until  2012 years ago when she started Copaxone.   She had skin reactions, she stopped after one year and switched to Aubagio (+/- late 2013).   She has tolerated it well.     REVIEW OF SYSTEMS: Constitutional: No fevers, chills, sweats, or change in appetite   She notes fatigue and poor sleep.    Eyes: No visual changes, double vision, eye pain Ear, nose and throat: No hearing loss, ear pain, nasal congestion, sore throat Cardiovascular: No chest pain, palpitations Respiratory: No shortness of breath at rest or with exertion.   No wheezes.  Some snoring GastrointestinaI: No nausea, vomiting, diarrhea, abdominal pain, fecal incontinence Genitourinary: No dysuria, urinary retention or frequency.  No nocturia. Musculoskeletal: No neck pain, back pain Integumentary: No rash, pruritus, skin lesions Neurological: as above Psychiatric: No depression at this time. Some anxiety Endocrine: No palpitations, diaphoresis, change in appetite, change in weigh or increased thirst Hematologic/Lymphatic: No anemia, purpura, petechiae. Allergic/Immunologic: No itchy/runny eyes, nasal congestion, recent allergic reactions, rashes  ALLERGIES: Allergies  Allergen Reactions  . Septra [Sulfamethoxazole-Trimethoprim] Rash    HOME MEDICATIONS:  Current Outpatient  Medications:  .  chlorthalidone (HYGROTON) 25 MG tablet, Take 12.5 mg by mouth daily., Disp: , Rfl:  .  cyclobenzaprine (FLEXERIL) 5 MG tablet, Take 1 tablet (5 mg total) by mouth at bedtime as needed., Disp: 30 tablet, Rfl: 11 .  escitalopram (LEXAPRO) 20 MG tablet, TK 1 T PO QD, Disp: , Rfl: 5 .  lamoTRIgine (LAMICTAL) 200 MG tablet, Take 400 mg by mouth daily. , Disp: , Rfl:  .  levonorgestrel (MIRENA) 20 MCG/24HR IUD, 1 each by Intrauterine route once., Disp: , Rfl:  .  meloxicam (MOBIC) 15 MG tablet, Take 1 tablet (15 mg total) by mouth daily. (Patient taking differently: Take 15 mg by mouth as needed. ), Disp: 30 tablet, Rfl: 5 .  metaxalone (SKELAXIN) 800 MG tablet, TAKE 1/2  TO 1 TABLET BY MOUTH TID PRN, Disp: , Rfl: 2 .  oxybutynin (DITROPAN) 5 MG tablet, Take 1 tablet (5 mg total) by mouth 2 (two) times daily., Disp: 60 tablet, Rfl: 11 .  Polyvinyl Alcohol-Povidone (REFRESH OP), Place 2 drops into both eyes 4 (four) times daily as needed (dryness)., Disp: , Rfl:  .  Teriflunomide (AUBAGIO) 14 MG TABS, Take 1 tablet by mouth daily., Disp: 30 tablet, Rfl: 11 No current facility-administered medications for this visit.   Facility-Administered Medications Ordered in Other Visits:  .  gadopentetate dimeglumine (MAGNEVIST) injection 20 mL, 20 mL, Intravenous, Once PRN, Salayah Meares A, MD .  gadopentetate dimeglumine (MAGNEVIST) injection 20 mL, 20 mL, Intravenous, Once PRN, Runa Whittingham, Nanine Means, MD  PAST MEDICAL HISTORY: Past Medical History:  Diagnosis Date  . Cervical high risk human papillomavirus (HPV) DNA test positive 09/2015  . Cholecystitis 2006   GALBLADDER REMOVED  .  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 History of mammogram 11/2014   FIBROADENOMA  . History of Papanicolaou smear of cervix 01/24/13; 10/15/15   -/-; -/+  . Hypertension   . Menorrhagia 02/01/2010   D/C HYSTEROSCOPY ENDO POLYP  . MS (multiple sclerosis) ([place])   . Ovarian cyst 9/15; 2015-2016   LEFT - WENT TO  [facility]; RIGHT    PAST SURGICAL HISTORY: Past Surgical History:  Procedure Laterality Date  . BREAST BIOPSY Right 2016   Fibroadenoma  . CHOLECYSTECTOMY  2006  . DILATION AND CURETTAGE, DIAGNOSTIC / THERAPEUTIC  02/01/2010   D/C HYSTEROSCOPY ENDO POLYP; [facility]  . FLEXIBLE SIGMOIDOSCOPY  07/31/2016   Procedure: FLEXIBLE SIGMOIDOSCOPY;  Surgeon: [name], MD;  Location: [facility] END[facility]COPY;  Service: Endoscopy;;  . HYSTEROSCOPY  02/01/2010   ENDO POLYP - [facility]    FAMILY HISTORY: Family History  Problem Relation Age of Onset  . Breast cancer Maternal Aunt 24  . Rheum arthritis Mother   . Hypertension Mother   . Thyroid disease Mother        HYPOTHYROIDISM  . Cancer Mother 35       LUNG  . Heart disease Father   . Diabetes Father   . Hypertension Father   . Cancer Paternal Grandmother        MELANOMA OF SKIN  . Cancer Cousin        KIDNEY-COUSIN    SOCIAL HISTORY:  Social History   Socioeconomic History  . Marital status: Divorced    Spouse name: Not on file  . Number of children: 0  . Years of education: 58  . Highest education level: Not on file  Occupational History    Employer: [employer]  . Financial resource strain: Not on file  . Food insecurity:    Worry: Not on file    Inability: Not on file  . Transportation needs:    Medical: Not on file    Non-medical: Not on file  Tobacco Use  . Smoking status: Former Research scientist (life sciences)  . Smokeless tobacco: Never Used  Substance and Sexual Activity  . Alcohol use: No  . Drug use: No  . Sexual activity: Not Currently    Birth control/protection: I.U.D.    Comment: Mirena  Lifestyle  . Physical activity:    Days per week: Not on file    Minutes per session: Not on file  . Stress: Not on file  Relationships  . Social connections:    Talks on phone: Not on file    Gets together: Not on file    Attends religious service: Not on file    Active member of club or organization: Not on file    Attends meetings of clubs  or organizations: Not on file    Relationship status: Not on file  . Intimate partner violence:    Fear of current or ex partner: Not on file    Emotionally abused: Not on file    Physically abused: Not on file    Forced sexual activity: Not on file  Other Topics Concern  . Not on file  Social History Narrative  . Not on file     PHYSICAL EXAM  Vitals:   09/06/18 1058  BP: 118/70  Pulse: 74  Temp: (!) 96.8 F (36 C)  SpO2: 95%  Weight: 231 lb 8 oz (105 kg)  Height: 5\' 6"  (1.676 m)    Body mass index is 37.37 kg/m.   General: The patient  is well-developed and well-nourished and in no acute distress.   Small knot/scab posterior scalp on the right from fall.    Neurologic Exam  Mental status: The patient is alert and oriented x 3 at the time of the examination. The patient has apparent normal recent and remote memory, with an apparently normal attention span and concentration ability.   Speech is normal.  Cranial nerves: Extraocular muscles are normal.  Facial strength and sensation are normal.   Trapezius is strong   The tongue is midline, and the patient has symmetric elevation of the soft palate. No obvious hearing deficits are noted.  Motor:  Muscle bulk is normal.   Muscle tone and strength is normal in the arms and legs.  Sensory: She has intact sensation to touch and vibration in the arms and legs.  Coordination: Cerebellar testing reveals good finger-nose-finger and heel-to-shin bilaterally.  Gait and station: Station is normal.   The gait is normal but the tandem gait is minimally wide..  The Romberg is negative.  Reflexes: Deep tendon reflexes are symmetric and normal bilaterally in arms and legs.    No clonus or spread.        DIAGNOSTIC DATA (LABS, IMAGING, TESTING) - I reviewed patient records, labs, notes, testing and imaging myself where available.  Lab Results  Component Value Date   WBC 6.9 12/30/2017   HGB 12.2 12/30/2017   HCT 38.3 12/30/2017    MCV 86 12/30/2017   PLT 347 12/30/2017      Component Value Date/Time   NA 139 04/25/2016 0416   NA 141 02/05/2016 1533   K 3.8 04/25/2016 0416   CL 105 04/25/2016 0416   CO2 28 04/25/2016 0416   GLUCOSE 100 (H) 04/25/2016 0416   BUN 9 04/25/2016 0416   BUN 9 02/05/2016 1533   CREATININE 0.85 04/25/2016 0416   CALCIUM 8.2 (L) 04/25/2016 0416   PROT 6.6 12/30/2017 0905   ALBUMIN 4.2 12/30/2017 0905   AST 15 12/30/2017 0905   ALT 13 12/30/2017 0905   ALKPHOS 101 12/30/2017 0905   BILITOT 0.7 12/30/2017 0905   GFRNONAA >60 04/25/2016 0416   GFRAA >60 04/25/2016 0416       ASSESSMENT AND PLAN  Multiple sclerosis (HCC)  Urinary dysfunction  Dysesthesia  Depression with anxiety  Vitamin D deficiency  Other fatigue  Gait disturbance   1.    Continue Aubagio.    2.   Continue  oxybutynin for bladder.  Continue meloxicam and flexeril as needed for LBP.     COntinue vit D supplements.  3.   Continue to be active and exercise as tolerated. 4.   She will continue medications for mood  5.    She will return to see me in 6 months or sooner if there are new or worsening neurologic symptoms.     Beckie Viscardi A. Felecia Shelling, MD, PhD 07/02/345, 42:59 AM Certified in Neurology, Clinical Neurophysiology, Sleep Medicine, Pain Medicine and Neuroimaging  [facility] [address], [place], [place] [address] [phone]

## 2018-09-27 ENCOUNTER — Telehealth: Payer: Self-pay | Admitting: *Deleted

## 2018-09-27 NOTE — Telephone Encounter (Signed)
Called, LVM for pt letting her know I received a fax from [facility] that they were unable to reach her to fill Aubagio. Asked her to call them back at [phone].

## 2018-10-20 ENCOUNTER — Telehealth: Payer: Self-pay | Admitting: *Deleted

## 2018-10-20 NOTE — Telephone Encounter (Signed)
Received notification from [facility] that PA approved through 10/20/2023. PA: [id number].

## 2018-10-20 NOTE — Telephone Encounter (Signed)
Submitted PA Aubagio on CMM. Key: [id number]. Waiting on determination.

## 2018-10-27 ENCOUNTER — Inpatient Hospital Stay (HOSPITAL_COMMUNITY)
Admission: EM | Admit: 2018-10-27 | Discharge: 2018-11-05 | DRG: 392 | Disposition: A | Discharge status: Home / Self Care | Payer: BC Managed Care – PPO | Attending: Family Medicine | Admitting: Family Medicine

## 2018-10-27 ENCOUNTER — Other Ambulatory Visit: Payer: Self-pay

## 2018-10-27 ENCOUNTER — Emergency Department (HOSPITAL_COMMUNITY): Payer: BC Managed Care – PPO

## 2018-10-27 ENCOUNTER — Encounter (HOSPITAL_COMMUNITY): Payer: Self-pay | Admitting: Family Medicine

## 2018-10-27 DIAGNOSIS — E876 Hypokalemia: Secondary | ICD-10-CM | POA: Diagnosis not present

## 2018-10-27 DIAGNOSIS — Z803 Family history of malignant neoplasm of breast: Secondary | ICD-10-CM | POA: Diagnosis not present

## 2018-10-27 DIAGNOSIS — F419 Anxiety disorder, unspecified: Secondary | ICD-10-CM | POA: Diagnosis present

## 2018-10-27 DIAGNOSIS — Z808 Family history of malignant neoplasm of other organs or systems: Secondary | ICD-10-CM | POA: Diagnosis not present

## 2018-10-27 DIAGNOSIS — K5792 Diverticulitis of intestine, part unspecified, without perforation or abscess without bleeding: Secondary | ICD-10-CM | POA: Diagnosis present

## 2018-10-27 DIAGNOSIS — Z882 Allergy status to sulfonamides status: Secondary | ICD-10-CM | POA: Diagnosis not present

## 2018-10-27 DIAGNOSIS — R000 Tachycardia, unspecified: Secondary | ICD-10-CM | POA: Diagnosis present

## 2018-10-27 DIAGNOSIS — Z833 Family history of diabetes mellitus: Secondary | ICD-10-CM

## 2018-10-27 DIAGNOSIS — Z79899 Other long term (current) drug therapy: Secondary | ICD-10-CM | POA: Diagnosis not present

## 2018-10-27 DIAGNOSIS — R0682 Tachypnea, not elsewhere classified: Secondary | ICD-10-CM | POA: Diagnosis present

## 2018-10-27 DIAGNOSIS — Z20828 Contact with and (suspected) exposure to other viral communicable diseases: Secondary | ICD-10-CM | POA: Diagnosis present

## 2018-10-27 DIAGNOSIS — G35 Multiple sclerosis: Secondary | ICD-10-CM | POA: Diagnosis present

## 2018-10-27 DIAGNOSIS — K5720 Diverticulitis of large intestine with perforation and abscess without bleeding: Secondary | ICD-10-CM | POA: Diagnosis not present

## 2018-10-27 DIAGNOSIS — F329 Major depressive disorder, single episode, unspecified: Secondary | ICD-10-CM | POA: Diagnosis present

## 2018-10-27 DIAGNOSIS — I10 Essential (primary) hypertension: Secondary | ICD-10-CM | POA: Diagnosis present

## 2018-10-27 DIAGNOSIS — Z8249 Family history of ischemic heart disease and other diseases of the circulatory system: Secondary | ICD-10-CM

## 2018-10-27 DIAGNOSIS — F418 Other specified anxiety disorders: Secondary | ICD-10-CM | POA: Diagnosis present

## 2018-10-27 DIAGNOSIS — B961 Klebsiella pneumoniae [K. pneumoniae] as the cause of diseases classified elsewhere: Secondary | ICD-10-CM | POA: Diagnosis present

## 2018-10-27 DIAGNOSIS — K5732 Diverticulitis of large intestine without perforation or abscess without bleeding: Secondary | ICD-10-CM | POA: Diagnosis not present

## 2018-10-27 DIAGNOSIS — Z8349 Family history of other endocrine, nutritional and metabolic diseases: Secondary | ICD-10-CM | POA: Diagnosis not present

## 2018-10-27 DIAGNOSIS — Z87891 Personal history of nicotine dependence: Secondary | ICD-10-CM

## 2018-10-27 DIAGNOSIS — Z1639 Resistance to other specified antimicrobial drug: Secondary | ICD-10-CM | POA: Diagnosis present

## 2018-10-27 DIAGNOSIS — R87810 Cervical high risk human papillomavirus (HPV) DNA test positive: Secondary | ICD-10-CM | POA: Diagnosis present

## 2018-10-27 DIAGNOSIS — Z8261 Family history of arthritis: Secondary | ICD-10-CM | POA: Diagnosis not present

## 2018-10-27 LAB — CBC
HCT: 42.7 % (ref 36.0–46.0)
Hemoglobin: 13.1 g/dL (ref 12.0–15.0)
MCH: 27.2 pg (ref 26.0–34.0)
MCHC: 30.7 g/dL (ref 30.0–36.0)
MCV: 88.6 fL (ref 80.0–100.0)
Platelets: 444 10*3/uL — ABNORMAL HIGH (ref 150–400)
RBC: 4.82 MIL/uL (ref 3.87–5.11)
RDW: 15 % (ref 11.5–15.5)
WBC: 15.1 10*3/uL — ABNORMAL HIGH (ref 4.0–10.5)
nRBC: 0 % (ref 0.0–0.2)

## 2018-10-27 LAB — URINALYSIS, ROUTINE W REFLEX MICROSCOPIC
Bilirubin Urine: NEGATIVE
Glucose, UA: NEGATIVE mg/dL
Hgb urine dipstick: NEGATIVE
Ketones, ur: 20 mg/dL — AB
Leukocytes,Ua: NEGATIVE
Nitrite: NEGATIVE
Protein, ur: NEGATIVE mg/dL
Specific Gravity, Urine: >1.046 — ABNORMAL HIGH (ref 1.005–1.030)
pH: 5 (ref 5.0–8.0)

## 2018-10-27 LAB — COMPREHENSIVE METABOLIC PANEL
ALT: 23 U/L (ref 0–44)
AST: 24 U/L (ref 15–41)
Albumin: 4 g/dL (ref 3.5–5.0)
Alkaline Phosphatase: 94 U/L (ref 38–126)
Anion gap: 13 (ref 5–15)
BUN: 15 mg/dL (ref 6–20)
CO2: 25 mmol/L (ref 22–32)
Calcium: 8.9 mg/dL (ref 8.9–10.3)
Chloride: 98 mmol/L (ref 98–111)
Creatinine, Ser: 1.07 mg/dL — ABNORMAL HIGH (ref 0.44–1.00)
GFR calc Af Amer: >60 mL/min (ref >60)
GFR calc non Af Amer: >60 mL/min (ref >60)
Glucose, Bld: 120 mg/dL — ABNORMAL HIGH (ref 70–99)
Potassium: 3.5 mmol/L (ref 3.5–5.1)
Sodium: 136 mmol/L (ref 135–145)
Total Bilirubin: 1.2 mg/dL (ref 0.3–1.2)
Total Protein: 7.9 g/dL (ref 6.5–8.1)

## 2018-10-27 LAB — SARS CORONAVIRUS 2 BY RT PCR (HOSPITAL ORDER, PERFORMED IN ~~LOC~~ HOSPITAL LAB): SARS Coronavirus 2: NEGATIVE

## 2018-10-27 LAB — I-STAT BETA HCG BLOOD, ED (MC, WL, AP ONLY): I-stat hCG, quantitative: <5 m[IU]/mL (ref <5)

## 2018-10-27 LAB — LIPASE, BLOOD: Lipase: 25 U/L (ref 11–51)

## 2018-10-27 MED ORDER — SODIUM CHLORIDE (PF) 0.9 % IJ SOLN
INTRAMUSCULAR | Status: AC
  Administered 2018-10-28: 3 mL via INTRAVENOUS
  Filled 2018-10-27: qty 50

## 2018-10-27 MED ORDER — METRONIDAZOLE IN NACL 5-0.79 MG/ML-% IV SOLN: 500.0000 mg | Freq: Three times a day (TID) | INTRAVENOUS | Status: DC

## 2018-10-27 MED ORDER — SODIUM CHLORIDE 0.9 % IV SOLN
INTRAVENOUS | Status: DC
  Administered 2018-10-27: 23:00:00 via INTRAVENOUS
  Filled 2018-10-27: qty 1000

## 2018-10-27 MED ORDER — METRONIDAZOLE IN NACL 5-0.79 MG/ML-% IV SOLN
500.0000 mg | Freq: Once | INTRAVENOUS | Status: DC
  Filled 2018-10-27: qty 100

## 2018-10-27 MED ORDER — ONDANSETRON HCL 4 MG/2ML IJ SOLN
4.0000 mg | Freq: Four times a day (QID) | INTRAMUSCULAR | Status: DC | PRN
  Administered 2018-10-28 – 2018-11-01 (×8): 4 mg via INTRAVENOUS
  Filled 2018-10-27 (×8): qty 2

## 2018-10-27 MED ORDER — MORPHINE SULFATE (PF) 4 MG/ML IV SOLN
4.0000 mg | Freq: Once | INTRAVENOUS | Status: AC
  Administered 2018-10-27: 4 mg via INTRAVENOUS
  Filled 2018-10-27: qty 1

## 2018-10-27 MED ORDER — SODIUM CHLORIDE 0.9 % IV BOLUS
1000.0000 mL | Freq: Once | INTRAVENOUS | Status: AC
  Administered 2018-10-27: 1000 mL via INTRAVENOUS

## 2018-10-27 MED ORDER — IOHEXOL 300 MG/ML  SOLN
100.0000 mL | Freq: Once | INTRAMUSCULAR | Status: AC | PRN
  Administered 2018-10-27: 100 mL via INTRAVENOUS

## 2018-10-27 MED ORDER — MORPHINE SULFATE (PF) 2 MG/ML IV SOLN
2.0000 mg | INTRAVENOUS | Status: DC | PRN
  Administered 2018-10-27 – 2018-10-28 (×3): 2 mg via INTRAVENOUS
  Administered 2018-10-28 (×2): 4 mg via INTRAVENOUS
  Administered 2018-10-28: 2 mg via INTRAVENOUS
  Administered 2018-10-29 (×2): 4 mg via INTRAVENOUS
  Administered 2018-10-29 – 2018-10-31 (×2): 2 mg via INTRAVENOUS
  Filled 2018-10-27 (×3): qty 2
  Filled 2018-10-27 (×2): qty 1
  Filled 2018-10-27 (×3): qty 2
  Filled 2018-10-27 (×3): qty 1

## 2018-10-27 MED ORDER — SODIUM CHLORIDE 0.9% FLUSH
3.0000 mL | Freq: Once | INTRAVENOUS | Status: AC
  Administered 2018-10-28: 3 mL via INTRAVENOUS

## 2018-10-27 MED ORDER — SODIUM CHLORIDE 0.9 % IV SOLN
2.0000 g | Freq: Once | INTRAVENOUS | Status: AC
  Administered 2018-10-27: 2 g via INTRAVENOUS
  Filled 2018-10-27: qty 2

## 2018-10-27 MED ORDER — SODIUM CHLORIDE 0.9 % IV SOLN: 2.0000 g | Freq: Three times a day (TID) | INTRAVENOUS | Status: DC

## 2018-10-27 MED ORDER — PIPERACILLIN-TAZOBACTAM 3.375 G IVPB
3.3750 g | Freq: Three times a day (TID) | INTRAVENOUS | Status: DC
  Administered 2018-10-28 – 2018-11-05 (×26): 3.375 g via INTRAVENOUS
  Filled 2018-10-27 (×27): qty 50

## 2018-10-27 MED ORDER — ONDANSETRON HCL 4 MG/2ML IJ SOLN
4.0000 mg | Freq: Once | INTRAMUSCULAR | Status: AC
  Administered 2018-10-27: 4 mg via INTRAVENOUS
  Filled 2018-10-27: qty 2

## 2018-10-27 NOTE — Progress Notes (Signed)
Pharmacy Antibiotic Note  Sandra Bonilla is a 43 y.o. female admitted on 10/27/2018 with intra-abdominal infection.  Started on Cefepime/Flagyl but patiet notes intolerance to Flagyl  Pharmacy has been consulted to switch to Zosyn.  Plan: Zosyn 3.375g IV q8h (4 hour infusion).  No dose adjustments anticipated.  Pharmacy to sign off.   Height: 5\' 6"  (167.6 cm) Weight: 230 lb (104.3 kg) IBW/kg (Calculated) : 59.3  Temp (24hrs), Avg:98.8 F (37.1 C), Min:98.8 F (37.1 C), Max:98.8 F (37.1 C)  Recent Labs  Lab 10/27/18 1223  WBC 15.1*  CREATININE 1.07*    Estimated Creatinine Clearance: 81 mL/min (A) (by C-G formula based on SCr of 1.07 mg/dL (H)).    Allergies  Allergen Reactions  . Septra [Sulfamethoxazole-Trimethoprim] Rash    Antimicrobials this admission: 7/29 Cfepime/Flagyl x1 7/30 >> Zosyn >>   Thank you for allowing pharmacy to be a part of this patient's care.  Biagio Borg 10/27/2018 9:24 PM

## 2018-10-27 NOTE — Consult Note (Signed)
Reason for Consult: Diverticulitis  Referring Physician: Dr Wilma Flavin is an 43 y.o. female.  HPI: Patient presents to the ER last night with abdominal pain that started earlier that morning.  She states the pain is localized to her lower abdomen.  She has had some mild nausea but denies any fevers chills or changes in bowel habits.  Past Medical History:  Diagnosis Date  . Cervical high risk human papillomavirus (HPV) DNA test positive 09/2015  . Cholecystitis 2006   GALBLADDER REMOVED  . 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 History of mammogram 11/2014   FIBROADENOMA  . History of Papanicolaou smear of cervix 01/24/13; 10/15/15   -/-; -/+  . Hypertension   . Menorrhagia 02/01/2010   D/C HYSTEROSCOPY ENDO POLYP  . MS (multiple sclerosis) ([place])   . Ovarian cyst 9/15; 2015-2016   LEFT - WENT TO [facility]; RIGHT    Past Surgical History:  Procedure Laterality Date  . BREAST BIOPSY Right 2016   Fibroadenoma  . CHOLECYSTECTOMY  2006  . DILATION AND CURETTAGE, DIAGNOSTIC / THERAPEUTIC  02/01/2010   D/C HYSTEROSCOPY ENDO POLYP; [facility]  . FLEXIBLE SIGMOIDOSCOPY  07/31/2016   Procedure: FLEXIBLE SIGMOIDOSCOPY;  Surgeon: [name], MD;  Location: [facility];  Service: Endoscopy;;  . HYSTEROSCOPY  02/01/2010   ENDO POLYP - [facility]    Family History  Problem Relation Age of Onset  . Breast cancer Maternal Aunt 12  . Rheum arthritis Mother   . Hypertension Mother   . Thyroid disease Mother        HYPOTHYROIDISM  . Cancer Mother 74       LUNG  . Heart disease Father   . Diabetes Father   . Hypertension Father   . Cancer Paternal Grandmother        MELANOMA OF SKIN  . Cancer Cousin        KIDNEY-COUSIN    Social History:  reports that she has quit smoking. She has never used smokeless tobacco. She reports that she does not drink alcohol or use drugs.  Allergies:  Allergies  Allergen Reactions  . Septra [Sulfamethoxazole-Trimethoprim] Rash    Medications: I have  reviewed the patient's current medications.  Results for orders placed or performed during the hospital encounter of 10/27/18 (from the past 48 hour(s))  Lipase, blood     Status: None   Collection Time: 10/27/18 12:23 PM  Result Value Ref Range   Lipase 25 11 - 51 U/L    Comment: Performed at [facility], [place] [address] [place], [place] [address]  Comprehensive metabolic panel     Status: Abnormal   Collection Time: 10/27/18 12:23 PM  Result Value Ref Range   Sodium 136 135 - 145 mmol/L   Potassium 3.5 3.5 - 5.1 mmol/L   Chloride 98 98 - 111 mmol/L   CO2 25 22 - 32 mmol/L   Glucose, Bld 120 (H) 70 - 99 mg/dL   BUN 15 6 - 20 mg/dL   Creatinine, Ser 1.07 (H) 0.44 - 1.00 mg/dL   Calcium 8.9 8.9 - 10.3 mg/dL   Total Protein 7.9 6.5 - 8.1 g/dL   Albumin 4.0 3.5 - 5.0 g/dL   AST 24 15 - 41 U/L   ALT 23 0 - 44 U/L   Alkaline Phosphatase 94 38 - 126 U/L   Total Bilirubin 1.2 0.3 - 1.2 mg/dL   GFR calc non Af Amer >60 >60 mL/min   GFR calc  Af Amer >60 >60 mL/min   Anion gap 13 5 - 15    Comment: Performed at [facility], [place] [address], [place], [place] [address]  CBC     Status: Abnormal   Collection Time: 10/27/18 12:23 PM  Result Value Ref Range   WBC 15.1 (H) 4.0 - 10.5 K/uL   RBC 4.82 3.87 - 5.11 MIL/uL   Hemoglobin 13.1 12.0 - 15.0 g/dL   HCT 42.7 36.0 - 46.0 %   MCV 88.6 80.0 - 100.0 fL   MCH 27.2 26.0 - 34.0 pg   MCHC 30.7 30.0 - 36.0 g/dL   RDW 15.0 11.5 - 15.5 %   Platelets 444 (H) 150 - 400 K/uL   nRBC 0.0 0.0 - 0.2 %    Comment: Performed at [facility], [place] [address], [place], [place] [address]  I-Stat beta hCG blood, ED     Status: None   Collection Time: 10/27/18 12:24 PM  Result Value Ref Range   I-stat hCG, quantitative <5.0 <5 mIU/mL   Comment 3            Comment:   GEST. AGE      CONC.  (mIU/mL)   <=1 WEEK        5 - 50     2 WEEKS       50 - 500     3 WEEKS       100 - 10,000     4 WEEKS      1,000 - 30,000        FEMALE AND NON-PREGNANT FEMALE:     LESS THAN 5 mIU/mL     Ct Abdomen Pelvis W Contrast  Result Date: 10/27/2018 CLINICAL DATA:  Severe lower abdominal pain, nausea, vomiting EXAM: CT ABDOMEN AND PELVIS WITH CONTRAST TECHNIQUE: Multidetector CT imaging of the abdomen and pelvis was performed using the standard protocol following bolus administration of intravenous contrast. CONTRAST:  144mL OMNIPAQUE IOHEXOL 300 MG/ML  SOLN COMPARISON:  04/21/2016 FINDINGS: Lower chest: No acute abnormality. Hepatobiliary: No focal liver abnormality is seen. Status post cholecystectomy. No biliary dilatation. Pancreas: Unremarkable. No pancreatic ductal dilatation or surrounding inflammatory changes. Spleen: Normal in size without significant abnormality. Adrenals/Urinary Tract: Adrenal glands are unremarkable. Kidneys are normal, without renal calculi, solid lesion, or hydronephrosis. Bladder is unremarkable. Stomach/Bowel: Stomach is within normal limits. Appendix appears normal. There is extensive fatty mural stratification of the ileum and the majority of the colon, with a very tethered appearance of the distal small bowel loops in the right lower quadrant (series 2, image 67). There is sigmoid diverticulosis with extensive thickening and fat stranding about the mid sigmoid. There is a tiny focus of extraluminal air anterior to the mid sigmoid (series 2, image 76). There are additional foci of air in the left upper quadrant mesentery (series 2, image 22, 29). No evidence of abscess. Vascular/Lymphatic: No significant vascular findings are present. No enlarged abdominal or pelvic lymph nodes. Reproductive: IUD is present in the endometrial cavity. Fluid attenuation lesions of the bilateral ovaries consistent with functional cysts or follicles. Other: No abdominal wall hernia or abnormality. Trace ascites in the low abdomen and pelvis. Musculoskeletal: No acute or significant osseous findings.  IMPRESSION: 1. There is sigmoid diverticulosis with extensive thickening and fat stranding about the mid sigmoid. There is a tiny focus of extraluminal air anterior to the mid sigmoid (series 2, image 76). Findings are consistent with acute diverticulitis complicated by perforation. There  are additional foci of air in the left upper quadrant mesentery (series 2, image 22, 29), which is an unusual location for abdominal air however there is no overt pneumoperitoneum about the liver or diaphragms. No evidence of abscess. 2. There is extensive fatty mural stratification of the ileum and the majority of the colon, with a very tethered appearance of the distal small bowel loops in the right lower quadrant (series 2, image 67). There is sigmoid diverticulosis with extensive thickening and fat stranding about the mid sigmoid. Findings are generally consistent with inflammatory bowel disease, particularly Crohn's disease, without particular findings to suggest acute inflammation. 3. Trace ascites in the low abdomen and pelvis. Electronically Signed   By: Eddie Candle M.D.   On: 10/27/2018 19:05    Review of Systems  Constitutional: Negative for chills and fever.  HENT: Negative for hearing loss.   Eyes: Negative for blurred vision.  Respiratory: Negative for cough and shortness of breath.   Cardiovascular: Negative for chest pain and palpitations.  Gastrointestinal: Positive for abdominal pain and nausea. Negative for constipation, diarrhea and vomiting.  Genitourinary: Negative for dysuria, frequency and urgency.   Blood pressure 135/78, pulse (!) 106, temperature 98.8 F (37.1 C), temperature source Oral, resp. rate (!) 24, height 5\' 6"  (1.676 m), weight 104.3 kg, SpO2 98 %. Physical Exam  Constitutional: She is oriented to person, place, and time. She appears well-developed and well-nourished.  HENT:  Head: Normocephalic and atraumatic.  Eyes: Pupils are equal, round, and reactive to light. Conjunctivae  and EOM are normal.  Neck: Normal range of motion. Neck supple.  Cardiovascular: Regular rhythm.  Respiratory: Effort normal. No respiratory distress.  GI: Soft. There is abdominal tenderness (suprapubic).  Neurological: She is alert and oriented to person, place, and time.  Skin: Skin is warm and dry.    Assessment/Plan: 43 year old with microperforation diverticulitis.  She has tenderness to palpation suprapubically.  She will need to be monitored closely over the next few days given her immunocompromise state.  If she recovers well, we can plan on an elective colectomy to prevent this from happening again in the future.  If she does not recover, she may need to proceed with surgical resection in the hospital.  Rosario Adie 12/02/90, 8:01 PM

## 2018-10-27 NOTE — H&P (Signed)
History and Physical    Sandra Bonilla BSJ:[id number] DOB: Apr 22, 1975 DOA: 10/27/2018  PCP: [name], MD   Patient coming from: Home   Chief Complaint: Abdominal pain   HPI: Sandra Bonilla is a 43 y.o. female with medical history significant for relapsing remitting multiple sclerosis, hypertension, depression with anxiety, and history of diverticulitis with microperforation in January 2018, now presenting to the emergency department with abdominal pain.  Patient reports that she went to bed last night in her usual state of health, but developed severe mid-lower abdominal pain this morning.  She describes severe pain that is localized across the mid to lower abdomen, constant, with no alleviating or exacerbating factors identified, associated with mild nausea, but no fevers, chills, diarrhea, or vomiting.  She reports that her MS has been stable, other chronic conditions have also been stable, and she has not had any cough, shortness of breath, fever, or sick contacts.  ED Course: Upon arrival to the ED, patient is found to be afebrile, saturating well on room air, slightly tachypneic, tachycardic to 110, and with stable blood pressure.  Chemistry panel is notable for creatinine 1.07, normal LFTs and lipase.  CBC features a leukocytosis to 15,100 and thrombocytosis with platelets 444,000.  CT of the abdomen and pelvis findings are concerning for acute sigmoid diverticulitis with microperforation although there is also some foci of air in an expected locations without any frank pneumoperitoneum, and findings also notable for tethering of the small bowel wall and suggestion of IBD.  Patient was given a liter of normal saline, morphine, cefepime, and Flagyl in the emergency department, and hospitalists consulted for admission.  Review of Systems:  All other systems reviewed and apart from HPI, are negative.  Past Medical History:  Diagnosis Date   Cervical high risk human  papillomavirus (HPV) DNA test positive 09/2015   Cholecystitis 2006   GALBLADDER REMOVED   Fibroadenoma 2016   RIGHT BREAST   History of mammogram 11/2014   FIBROADENOMA   History of Papanicolaou smear of cervix 01/24/13; 10/15/15   -/-; -/+   Hypertension    Menorrhagia 02/01/2010   D/C HYSTEROSCOPY ENDO POLYP   MS (multiple sclerosis) ([place])    Ovarian cyst 9/15; 2015-2016   LEFT - WENT TO [facility]; RIGHT    Past Surgical History:  Procedure Laterality Date   BREAST BIOPSY Right 2016   Fibroadenoma   CHOLECYSTECTOMY  2006   DILATION AND CURETTAGE, DIAGNOSTIC / THERAPEUTIC  02/01/2010   D/C HYSTEROSCOPY ENDO POLYP; [facility]   FLEXIBLE SIGMOIDOSCOPY  07/31/2016   Procedure: FLEXIBLE SIGMOIDOSCOPY;  Surgeon: [name], MD;  Location: [facility];  Service: Endoscopy;;   HYSTEROSCOPY  02/01/2010   ENDO POLYP - [facility]     reports that she has quit smoking. She has never used smokeless tobacco. She reports that she does not drink alcohol or use drugs.  Allergies  Allergen Reactions   Septra [Sulfamethoxazole-Trimethoprim] Rash    Family History  Problem Relation Age of Onset   Breast cancer Maternal Aunt 10   Rheum arthritis Mother    Hypertension Mother    Thyroid disease Mother        HYPOTHYROIDISM   Cancer Mother 46       LUNG   Heart disease Father    Diabetes Father    Hypertension Father    Cancer Paternal Grandmother        MELANOMA OF SKIN   Cancer Cousin  KIDNEY-COUSIN     Prior to Admission medications   Medication Sig Start Date End Date Taking? Authorizing Provider  chlorthalidone (HYGROTON) 25 MG tablet Take 12.5 mg by mouth daily.   Yes [provider]  cholecalciferol (VITAMIN D3) 25 MCG (1000 UT) tablet Take 1,000 Units by mouth daily.   Yes [provider]  cyclobenzaprine (FLEXERIL) 5 MG tablet Take 1 tablet (5 mg total) by mouth at bedtime as needed. Patient taking differently: Take 5 mg by mouth at  bedtime as needed for muscle spasms.  12/26/16  Yes Sater, Nanine Means, MD  escitalopram (LEXAPRO) 20 MG tablet Take 20 mg by mouth daily.  11/11/17  Yes [provider]  lamoTRIgine (LAMICTAL) 200 MG tablet Take 400 mg by mouth daily.    Yes [provider]  levonorgestrel (MIRENA) 20 MCG/24HR IUD 1 each by Intrauterine route once. 11/14/15  Yes [provider]  oxybutynin (DITROPAN) 5 MG tablet Take 1 tablet (5 mg total) by mouth 2 (two) times daily. 12/30/17  Yes Sater, Nanine Means, MD  Probiotic Product (PROBIOTIC PO) Take 1 capsule by mouth daily.   Yes [provider]  Teriflunomide (AUBAGIO) 14 MG TABS Take 1 tablet by mouth daily. 04/12/18  Yes Sater, Nanine Means, MD  gabapentin (NEURONTIN) 100 MG capsule Take 100 mg by mouth daily as needed (nerve pain).  05/19/18   [provider]  meloxicam (MOBIC) 15 MG tablet Take 1 tablet (15 mg total) by mouth daily. Patient taking differently: Take 15 mg by mouth daily as needed.  12/26/16   Sater, Nanine Means, MD  Polyvinyl Alcohol-Povidone (REFRESH OP) Place 2 drops into both eyes 4 (four) times daily as needed (dryness).    [provider]    Physical Exam: Vitals:   10/27/18 1134 10/27/18 1346 10/27/18 1846  BP: 116/76 123/69 135/78  Pulse: 97 (!) 103 (!) 110  Resp: 18 18 (!) 24  Temp: 98.8 F (37.1 C)    TempSrc: Oral    SpO2: 100% 99% 97%  Weight: 104.3 kg    Height: 5\' 6"  (1.676 m)      Constitutional: NAD, calm  Eyes: PERTLA, lids and conjunctivae normal ENMT: Mucous membranes are moist. Posterior pharynx clear of any exudate or lesions.   Neck: normal, supple, no masses, no thyromegaly Respiratory: clear to auscultation bilaterally, no wheezing, no crackles. No accessory muscle use.  Cardiovascular: Rate ~110 and regular. No extremity edema. 2+ pedal pulses.   Abdomen: No distension, soft, generalized abdominal tenderness, most severe in mid-lower abdomen. Bowel sounds active.    Musculoskeletal: no clubbing / cyanosis. No joint deformity upper and lower extremities.   Skin: no significant rashes, lesions, ulcers. Warm, dry, well-perfused. Neurologic: no facial asymmetry. Sensation intact. Moving all extremities.  Psychiatric: Alert and oriented x 3. Pleasant, cooperative.    Labs on Admission: I have personally reviewed following labs and imaging studies  CBC: Recent Labs  Lab 10/27/18 1223  WBC 15.1*  HGB 13.1  HCT 42.7  MCV 88.6  PLT 161*   Basic Metabolic Panel: Recent Labs  Lab 10/27/18 1223  NA 136  K 3.5  CL 98  CO2 25  GLUCOSE 120*  BUN 15  CREATININE 1.07*  CALCIUM 8.9   GFR: Estimated Creatinine Clearance: 81 mL/min (A) (by C-G formula based on SCr of 1.07 mg/dL (H)). Liver Function Tests: Recent Labs  Lab 10/27/18 1223  AST 24  ALT 23  ALKPHOS 94  BILITOT 1.2  PROT 7.9  ALBUMIN 4.0   Recent Labs  Lab 10/27/18 1223  LIPASE 25   No results for input(s): AMMONIA in the last 168 hours. Coagulation Profile: No results for input(s): INR, PROTIME in the last 168 hours. Cardiac Enzymes: No results for input(s): CKTOTAL, CKMB, CKMBINDEX, TROPONINI in the last 168 hours. BNP (last 3 results) No results for input(s): PROBNP in the last 8760 hours. HbA1C: No results for input(s): HGBA1C in the last 72 hours. CBG: No results for input(s): GLUCAP in the last 168 hours. Lipid Profile: No results for input(s): CHOL, HDL, LDLCALC, TRIG, CHOLHDL, LDLDIRECT in the last 72 hours. Thyroid Function Tests: No results for input(s): TSH, T4TOTAL, FREET4, T3FREE, THYROIDAB in the last 72 hours. Anemia Panel: No results for input(s): VITAMINB12, FOLATE, FERRITIN, TIBC, IRON, RETICCTPCT in the last 72 hours. Urine analysis:    Component Value Date/Time   COLORURINE YELLOW 04/21/2016 0425   APPEARANCEUR CLEAR 04/21/2016 0425   LABSPEC 1.029 04/21/2016 0425   PHURINE 5.0 04/21/2016 0425   GLUCOSEU NEGATIVE 04/21/2016 0425   HGBUR  NEGATIVE 04/21/2016 0425   BILIRUBINUR NEGATIVE 04/21/2016 0425   KETONESUR 5 (A) 04/21/2016 0425   PROTEINUR NEGATIVE 04/21/2016 0425   UROBILINOGEN 0.2 12/12/2013 2356   NITRITE NEGATIVE 04/21/2016 0425   LEUKOCYTESUR NEGATIVE 04/21/2016 0425   Sepsis Labs: @LABRCNTIP (procalcitonin:4,lacticidven:4) )No results found for this or any previous visit (from the past 240 hour(s)).   Radiological Exams on Admission: Ct Abdomen Pelvis W Contrast  Result Date: 10/27/2018 CLINICAL DATA:  Severe lower abdominal pain, nausea, vomiting EXAM: CT ABDOMEN AND PELVIS WITH CONTRAST TECHNIQUE: Multidetector CT imaging of the abdomen and pelvis was performed using the standard protocol following bolus administration of intravenous contrast. CONTRAST:  127mL OMNIPAQUE IOHEXOL 300 MG/ML  SOLN COMPARISON:  04/21/2016 FINDINGS: Lower chest: No acute abnormality. Hepatobiliary: No focal liver abnormality is seen. Status post cholecystectomy. No biliary dilatation. Pancreas: Unremarkable. No pancreatic ductal dilatation or surrounding inflammatory changes. Spleen: Normal in size without significant abnormality. Adrenals/Urinary Tract: Adrenal glands are unremarkable. Kidneys are normal, without renal calculi, solid lesion, or hydronephrosis. Bladder is unremarkable. Stomach/Bowel: Stomach is within normal limits. Appendix appears normal. There is extensive fatty mural stratification of the ileum and the majority of the colon, with a very tethered appearance of the distal small bowel loops in the right lower quadrant (series 2, image 67). There is sigmoid diverticulosis with extensive thickening and fat stranding about the mid sigmoid. There is a tiny focus of extraluminal air anterior to the mid sigmoid (series 2, image 76). There are additional foci of air in the left upper quadrant mesentery (series 2, image 22, 29). No evidence of abscess. Vascular/Lymphatic: No significant vascular findings are present. No enlarged  abdominal or pelvic lymph nodes. Reproductive: IUD is present in the endometrial cavity. Fluid attenuation lesions of the bilateral ovaries consistent with functional cysts or follicles. Other: No abdominal wall hernia or abnormality. Trace ascites in the low abdomen and pelvis. Musculoskeletal: No acute or significant osseous findings. IMPRESSION: 1. There is sigmoid diverticulosis with extensive thickening and fat stranding about the mid sigmoid. There is a tiny focus of extraluminal air anterior to the mid sigmoid (series 2, image 76). Findings are consistent with acute diverticulitis complicated by perforation. There are additional foci of air in the left upper quadrant mesentery (series 2, image 22, 29), which is an unusual location for abdominal air however there is no overt pneumoperitoneum about the liver or diaphragms. No evidence of abscess. 2. There is extensive  fatty mural stratification of the ileum and the majority of the colon, with a very tethered appearance of the distal small bowel loops in the right lower quadrant (series 2, image 67). There is sigmoid diverticulosis with extensive thickening and fat stranding about the mid sigmoid. Findings are generally consistent with inflammatory bowel disease, particularly Crohn's disease, without particular findings to suggest acute inflammation. 3. Trace ascites in the low abdomen and pelvis. Electronically Signed   By: Eddie Candle M.D.   On: 10/27/2018 19:05    EKG: Not performed.   Assessment/Plan   1. Acute sigmoid diverticulitis with microperforation  - Presents with severe mid-lower abdominal pain, is afebrile with mild tachycardia and stable BP, found to have acute sigmoid diverticulitis on CT with microperforation, no abscess or frank pneumoperitoneum  - She was started on cefepime and Flagyl in ED and pain is well-controlled after a single dose of morphine  - Surgery consulting and much appreciated  - Continue medical management with  cefepime, Flagyl, bowel-rest, IVF, pain-control    2. Multiple sclerosis  - Relapsing-remitting, followed by neurology and managed with teriflunomide   - Patient reports this has been stable  - Hold teriflunomide in light of acute infection    3. Hypertension  - BP at goal    4. Depression, anxiety  - Stable  - Bowel rest for now, resume home medications when improving     5. ?Inflammatory bowel disease  - CT abd/pelvis findings suggest possible IBD without acute inflammation  - Outpatient follow-up recommended     PPE: Mask, face shield  DVT prophylaxis: SCD's  Code Status: Full  Family Communication: Discussed with patient  Consults called: General surgery   Admission status: Inpatient. Patient has acute diverticulitis with microperforation, is at increased risk for life-threatening complications due to use of immunosuppressive medication, and will require inpatient management with surgical consultation. She had a similar episode in 2018 that required a week of inpatient treatment.    Vianne Bulls, MD [facility] Pager [phone]  If 7PM-7AM, please contact night-coverage [email or web address] Password [facility]  10/27/2018, 7:35 PM

## 2018-10-27 NOTE — ED Provider Notes (Signed)
[facility] DEPT Provider Note   CSN: [id number] Arrival date & time: 10/27/18  1120     History   Chief Complaint Chief Complaint  Patient presents with   Abdominal Pain    HPI Sandra Bonilla is a 43 y.o. female.     Patient c/o diffuse abdominal pain, acute onset this AM. Pain acute onset, mod-sev, dull, non radiating, diffuse. No hx same pain. No dysuria or hematuria. No vaginal discharge or bleeding. Having normal periods. No fever or chills. Normal appetite today. No vomiting. Had normal bm this evening. No abd distension. Denies back or flank pain. Hx diverticula/itis. Denies chest pain or sob. No cough or uri symptoms.   The history is provided by the patient.  Abdominal Pain Associated symptoms: no chest pain, no chills, no cough, no diarrhea, no dysuria, no fever, no shortness of breath, no sore throat, no vaginal bleeding, no vaginal discharge and no vomiting     Past Medical History:  Diagnosis Date   Cervical high risk human papillomavirus (HPV) DNA test positive 09/2015   Cholecystitis 2006   GALBLADDER REMOVED   Fibroadenoma 2016   RIGHT BREAST   History of mammogram 11/2014   FIBROADENOMA   History of Papanicolaou smear of cervix 01/24/13; 10/15/15   -/-; -/+   Hypertension    Menorrhagia 02/01/2010   D/C HYSTEROSCOPY ENDO POLYP   MS (multiple sclerosis) ([place])    Ovarian cyst 9/15; 2015-2016   LEFT - WENT TO [facility]; RIGHT    Patient Active Problem List   Diagnosis Date Noted   Urinary dysfunction 12/30/2017   Varicose veins of bilateral lower extremities with pain 10/28/2017   Low grade squamous intraepithelial lesion (LGSIL) on cervical Pap smear 10/29/2016   Cervical high risk human papillomavirus (HPV) DNA test positive 10/15/2016   Head injury 06/25/2016   Essential hypertension 04/22/2016   Diverticulitis of large intestine with perforation without bleeding    Multiple sclerosis ([place])     Diverticulitis 04/21/2016   Other headache syndrome 02/05/2016   Vitamin D deficiency 10/09/2015   Other fatigue 10/09/2015   Dysesthesia 04/11/2015   Depression with anxiety 04/11/2015   Gait disturbance 12/13/2014   Insomnia 12/13/2014   Multiple sclerosis, relapsing-remitting ([place]) 08/30/2012   Relapsing remitting multiple sclerosis ([place]) 08/30/2012    Past Surgical History:  Procedure Laterality Date   BREAST BIOPSY Right 2016   Fibroadenoma   CHOLECYSTECTOMY  2006   DILATION AND CURETTAGE, DIAGNOSTIC / THERAPEUTIC  02/01/2010   D/C HYSTEROSCOPY ENDO POLYP; [facility]   FLEXIBLE SIGMOIDOSCOPY  07/31/2016   Procedure: FLEXIBLE SIGMOIDOSCOPY;  Surgeon: [name], MD;  Location: [facility] ENDOSCOPY;  Service: Endoscopy;;   HYSTEROSCOPY  02/01/2010   ENDO POLYP - [facility]     OB History    Gravida  0   Para  0   Term  0   Preterm  0   AB  0   Living  0     SAB  0   TAB  0   Ectopic  0   Multiple  0   Live Births  0            Home Medications    Prior to Admission medications   Medication Sig Start Date End Date Taking? Authorizing Provider  chlorthalidone (HYGROTON) 25 MG tablet Take 12.5 mg by mouth daily.    [provider]  cyclobenzaprine (FLEXERIL) 5 MG tablet Take 1 tablet (5 mg total) by mouth at bedtime  as needed. 12/26/16   Sater, Nanine Means, MD  escitalopram (LEXAPRO) 20 MG tablet TK 1 T PO QD 11/11/17   [provider]  lamoTRIgine (LAMICTAL) 200 MG tablet Take 400 mg by mouth daily.     [provider]  levonorgestrel (MIRENA) 20 MCG/24HR IUD 1 each by Intrauterine route once. 11/14/15   [provider]  meloxicam (MOBIC) 15 MG tablet Take 1 tablet (15 mg total) by mouth daily. Patient taking differently: Take 15 mg by mouth as needed.  12/26/16   Sater, Nanine Means, MD  metaxalone (SKELAXIN) 800 MG tablet TAKE 1/2  TO 1 TABLET BY MOUTH TID PRN 08/18/17   [provider]  oxybutynin (DITROPAN) 5 MG  tablet Take 1 tablet (5 mg total) by mouth 2 (two) times daily. 12/30/17   Sater, Nanine Means, MD  Polyvinyl Alcohol-Povidone (REFRESH OP) Place 2 drops into both eyes 4 (four) times daily as needed (dryness).    [provider]  Teriflunomide (AUBAGIO) 14 MG TABS Take 1 tablet by mouth daily. 04/12/18   Sater, Nanine Means, MD    Family History Family History  Problem Relation Age of Onset   Breast cancer Maternal Aunt 21   Rheum arthritis Mother    Hypertension Mother    Thyroid disease Mother        HYPOTHYROIDISM   Cancer Mother 59       LUNG   Heart disease Father    Diabetes Father    Hypertension Father    Cancer Paternal Grandmother        MELANOMA OF SKIN   Cancer Cousin        KIDNEY-COUSIN    Social History Social History   Tobacco Use   Smoking status: Former Smoker   Smokeless tobacco: Never Used  Substance Use Topics   Alcohol use: No   Drug use: No     Allergies   Septra [sulfamethoxazole-trimethoprim]   Review of Systems Review of Systems  Constitutional: Negative for chills and fever.  HENT: Negative for sore throat.   Eyes: Negative for redness.  Respiratory: Negative for cough and shortness of breath.   Cardiovascular: Negative for chest pain.  Gastrointestinal: Positive for abdominal pain. Negative for diarrhea and vomiting.  Endocrine: Negative for polyuria.  Genitourinary: Negative for dysuria, flank pain, vaginal bleeding and vaginal discharge.  Musculoskeletal: Negative for back pain and neck pain.  Skin: Negative for rash.  Neurological: Negative for headaches.  Hematological: Does not bruise/bleed easily.  Psychiatric/Behavioral: Negative for confusion.     Physical Exam Updated Vital Signs BP 123/69 (BP Location: Left Arm)    Pulse (!) 103    Temp 98.8 F (37.1 C) (Oral)    Resp 18    Ht 1.676 m (5\' 6" )    Wt 104.3 kg    SpO2 99%    BMI 37.12 kg/m   Physical Exam Vitals signs and nursing note reviewed.    Constitutional:      Appearance: Normal appearance. She is well-developed.  HENT:     Head: Atraumatic.     Nose: Nose normal.     Mouth/Throat:     Mouth: Mucous membranes are moist.  Eyes:     General: No scleral icterus.    Conjunctiva/sclera: Conjunctivae normal.  Neck:     Musculoskeletal: Normal range of motion and neck supple. No neck rigidity or muscular tenderness.     Trachea: No tracheal deviation.  Cardiovascular:     Rate and Rhythm:  Normal rate and regular rhythm.     Pulses: Normal pulses.     Heart sounds: Normal heart sounds. No murmur. No friction rub. No gallop.   Pulmonary:     Effort: Pulmonary effort is normal. No respiratory distress.     Breath sounds: Normal breath sounds.  Abdominal:     General: Bowel sounds are normal. There is no distension.     Palpations: Abdomen is soft. There is no mass.     Tenderness: There is abdominal tenderness. There is no guarding or rebound.     Hernia: No hernia is present.     Comments: Moderate diffuse tenderness.   Genitourinary:    Comments: No cva tenderness.  Musculoskeletal:        General: No swelling.  Skin:    General: Skin is warm and dry.     Findings: No rash.  Neurological:     Mental Status: She is alert.     Comments: Alert, speech normal.   Psychiatric:        Mood and Affect: Mood normal.      ED Treatments / Results  Labs (all labs ordered are listed, but only abnormal results are displayed) Results for orders placed or performed during the hospital encounter of 10/27/18  Lipase, blood  Result Value Ref Range   Lipase 25 11 - 51 U/L  Comprehensive metabolic panel  Result Value Ref Range   Sodium 136 135 - 145 mmol/L   Potassium 3.5 3.5 - 5.1 mmol/L   Chloride 98 98 - 111 mmol/L   CO2 25 22 - 32 mmol/L   Glucose, Bld 120 (H) 70 - 99 mg/dL   BUN 15 6 - 20 mg/dL   Creatinine, Ser 1.07 (H) 0.44 - 1.00 mg/dL   Calcium 8.9 8.9 - 10.3 mg/dL   Total Protein 7.9 6.5 - 8.1 g/dL   Albumin  4.0 3.5 - 5.0 g/dL   AST 24 15 - 41 U/L   ALT 23 0 - 44 U/L   Alkaline Phosphatase 94 38 - 126 U/L   Total Bilirubin 1.2 0.3 - 1.2 mg/dL   GFR calc non Af Amer >60 >60 mL/min   GFR calc Af Amer >60 >60 mL/min   Anion gap 13 5 - 15  CBC  Result Value Ref Range   WBC 15.1 (H) 4.0 - 10.5 K/uL   RBC 4.82 3.87 - 5.11 MIL/uL   Hemoglobin 13.1 12.0 - 15.0 g/dL   HCT 42.7 36.0 - 46.0 %   MCV 88.6 80.0 - 100.0 fL   MCH 27.2 26.0 - 34.0 pg   MCHC 30.7 30.0 - 36.0 g/dL   RDW 15.0 11.5 - 15.5 %   Platelets 444 (H) 150 - 400 K/uL   nRBC 0.0 0.0 - 0.2 %  I-Stat beta hCG blood, ED  Result Value Ref Range   I-stat hCG, quantitative <5.0 <5 mIU/mL   Comment 3           EKG None  Radiology Ct Abdomen Pelvis W Contrast  Result Date: 10/27/2018 CLINICAL DATA:  Severe lower abdominal pain, nausea, vomiting EXAM: CT ABDOMEN AND PELVIS WITH CONTRAST TECHNIQUE: Multidetector CT imaging of the abdomen and pelvis was performed using the standard protocol following bolus administration of intravenous contrast. CONTRAST:  173mL OMNIPAQUE IOHEXOL 300 MG/ML  SOLN COMPARISON:  04/21/2016 FINDINGS: Lower chest: No acute abnormality. Hepatobiliary: No focal liver abnormality is seen. Status post cholecystectomy. No biliary dilatation. Pancreas: Unremarkable. No pancreatic ductal dilatation  or surrounding inflammatory changes. Spleen: Normal in size without significant abnormality. Adrenals/Urinary Tract: Adrenal glands are unremarkable. Kidneys are normal, without renal calculi, solid lesion, or hydronephrosis. Bladder is unremarkable. Stomach/Bowel: Stomach is within normal limits. Appendix appears normal. There is extensive fatty mural stratification of the ileum and the majority of the colon, with a very tethered appearance of the distal small bowel loops in the right lower quadrant (series 2, image 67). There is sigmoid diverticulosis with extensive thickening and fat stranding about the mid sigmoid. There is a  tiny focus of extraluminal air anterior to the mid sigmoid (series 2, image 76). There are additional foci of air in the left upper quadrant mesentery (series 2, image 22, 29). No evidence of abscess. Vascular/Lymphatic: No significant vascular findings are present. No enlarged abdominal or pelvic lymph nodes. Reproductive: IUD is present in the endometrial cavity. Fluid attenuation lesions of the bilateral ovaries consistent with functional cysts or follicles. Other: No abdominal wall hernia or abnormality. Trace ascites in the low abdomen and pelvis. Musculoskeletal: No acute or significant osseous findings. IMPRESSION: 1. There is sigmoid diverticulosis with extensive thickening and fat stranding about the mid sigmoid. There is a tiny focus of extraluminal air anterior to the mid sigmoid (series 2, image 76). Findings are consistent with acute diverticulitis complicated by perforation. There are additional foci of air in the left upper quadrant mesentery (series 2, image 22, 29), which is an unusual location for abdominal air however there is no overt pneumoperitoneum about the liver or diaphragms. No evidence of abscess. 2. There is extensive fatty mural stratification of the ileum and the majority of the colon, with a very tethered appearance of the distal small bowel loops in the right lower quadrant (series 2, image 67). There is sigmoid diverticulosis with extensive thickening and fat stranding about the mid sigmoid. Findings are generally consistent with inflammatory bowel disease, particularly Crohn's disease, without particular findings to suggest acute inflammation. 3. Trace ascites in the low abdomen and pelvis. Electronically Signed   By: Eddie Candle M.D.   On: 10/27/2018 19:05    Procedures Procedures (including critical care time)  Medications Ordered in ED Medications  sodium chloride flush (NS) 0.9 % injection 3 mL (has no administration in time range)  sodium chloride 0.9 % bolus 1,000  mL (has no administration in time range)  morphine 4 MG/ML injection 4 mg (has no administration in time range)  ondansetron (ZOFRAN) injection 4 mg (has no administration in time range)     Initial Impression / Assessment and Plan / ED Course  I have reviewed the triage vital signs and the nursing notes.  Pertinent labs & imaging results that were available during my care of the patient were reviewed by me and considered in my medical decision making (see chart for details).  Iv ns bolus. Morphine iv. zofran iv. Stat labs.   Reviewed nursing notes and prior charts for additional history.   Labs reviewed by me - wbc elevated. preg neg.   CT ordered.   CT reviewed by me - diverticulitis w microperf. Also ? Changes c/w ibd. No abscess. Cefepime and flagyl iv.   Iv ns bolus.   Pain improved w meds.  Hospitalist service consulted for admission.    Final Clinical Impressions(s) / ED Diagnoses   Final diagnoses:  None    ED Discharge Orders    None       Lajean Saver, MD 10/27/18 [phone]

## 2018-10-27 NOTE — ED Triage Notes (Signed)
Patient is from home and complaining of mid abd pain that started at 8am this morning. Reports mild nausea, possibly from ambulance ride, and denies vomiting or diarrhea. Denies any urinary symptoms or vaginal bleeding or pain.

## 2018-10-28 ENCOUNTER — Other Ambulatory Visit: Payer: Self-pay

## 2018-10-28 DIAGNOSIS — K5792 Diverticulitis of intestine, part unspecified, without perforation or abscess without bleeding: Secondary | ICD-10-CM

## 2018-10-28 LAB — COMPREHENSIVE METABOLIC PANEL
ALT: 20 U/L (ref 0–44)
AST: 23 U/L (ref 15–41)
Albumin: 3.4 g/dL — ABNORMAL LOW (ref 3.5–5.0)
Alkaline Phosphatase: 81 U/L (ref 38–126)
Anion gap: 12 (ref 5–15)
BUN: 15 mg/dL (ref 6–20)
CO2: 24 mmol/L (ref 22–32)
Calcium: 8.1 mg/dL — ABNORMAL LOW (ref 8.9–10.3)
Chloride: 101 mmol/L (ref 98–111)
Creatinine, Ser: 0.93 mg/dL (ref 0.44–1.00)
GFR calc Af Amer: >60 mL/min (ref >60)
GFR calc non Af Amer: >60 mL/min (ref >60)
Glucose, Bld: 112 mg/dL — ABNORMAL HIGH (ref 70–99)
Potassium: 3.2 mmol/L — ABNORMAL LOW (ref 3.5–5.1)
Sodium: 137 mmol/L (ref 135–145)
Total Bilirubin: 1.4 mg/dL — ABNORMAL HIGH (ref 0.3–1.2)
Total Protein: 7.3 g/dL (ref 6.5–8.1)

## 2018-10-28 LAB — CBC WITH DIFFERENTIAL/PLATELET
Abs Immature Granulocytes: 0.12 10*3/uL — ABNORMAL HIGH (ref 0.00–0.07)
Basophils Absolute: 0.1 10*3/uL (ref 0.0–0.1)
Basophils Relative: 0 %
Eosinophils Absolute: 0.5 10*3/uL (ref 0.0–0.5)
Eosinophils Relative: 2 %
HCT: 42.3 % (ref 36.0–46.0)
Hemoglobin: 13 g/dL (ref 12.0–15.0)
Immature Granulocytes: 1 %
Lymphocytes Relative: 8 %
Lymphs Abs: 1.6 10*3/uL (ref 0.7–4.0)
MCH: 27.7 pg (ref 26.0–34.0)
MCHC: 30.7 g/dL (ref 30.0–36.0)
MCV: 90.2 fL (ref 80.0–100.0)
Monocytes Absolute: 1.6 10*3/uL — ABNORMAL HIGH (ref 0.1–1.0)
Monocytes Relative: 8 %
Neutro Abs: 16.6 10*3/uL — ABNORMAL HIGH (ref 1.7–7.7)
Neutrophils Relative %: 81 %
Platelets: 363 10*3/uL (ref 150–400)
RBC: 4.69 MIL/uL (ref 3.87–5.11)
RDW: 15.3 % (ref 11.5–15.5)
WBC: 20.4 10*3/uL — ABNORMAL HIGH (ref 4.0–10.5)
nRBC: 0 % (ref 0.0–0.2)

## 2018-10-28 LAB — HIV ANTIBODY (ROUTINE TESTING W REFLEX): HIV Screen 4th Generation wRfx: NONREACTIVE

## 2018-10-28 MED ORDER — ACETAMINOPHEN 650 MG RE SUPP: 650.0000 mg | Freq: Four times a day (QID) | RECTAL | Status: DC | PRN

## 2018-10-28 MED ORDER — ACETAMINOPHEN 325 MG PO TABS
650.0000 mg | ORAL_TABLET | Freq: Four times a day (QID) | ORAL | Status: DC | PRN
  Administered 2018-10-28 – 2018-11-03 (×6): 650 mg via ORAL
  Filled 2018-10-28 (×6): qty 2

## 2018-10-28 NOTE — ED Notes (Addendum)
ED TO INPATIENT HANDOFF REPORT  ED Nurse Name and Phone #: Fredonia Highland [phone]  S Name/Age/Gender Sandra Bonilla 43 y.o. female Room/Bed: WA02/WA02  Code Status   Code Status: Full Code  Home/SNF/Other Home Patient oriented to: self, place, time and situation Is this baseline? Yes   Triage Complete: Triage complete  Chief Complaint abdominal pain  Triage Note Patient is from home and complaining of mid abd pain that started at 8am this morning. Reports mild nausea, possibly from ambulance ride, and denies vomiting or diarrhea. Denies any urinary symptoms or vaginal bleeding or pain.    Allergies Allergies  Allergen Reactions  . Septra [Sulfamethoxazole-Trimethoprim] Rash    Level of Care/Admitting Diagnosis ED Disposition    ED Disposition Condition Comment   Admit  Hospital Area: [facility] [[id number]]  Level of Care: Med-Surg [16]  Covid Evaluation: Asymptomatic Screening Protocol (No Symptoms)  Diagnosis: Sigmoid diverticulitis [782423]  Admitting Physician: Vianne Bulls [[id number]]  Attending Physician: Vianne Bulls [[id number]]  Estimated length of stay: past midnight tomorrow  Certification:: I certify this patient will need inpatient services for at least 2 midnights  PT Class (Do Not Modify): Inpatient [101]  PT Acc Code (Do Not Modify): Private [1]       B Medical/Surgery History Past Medical History:  Diagnosis Date  . Cervical high risk human papillomavirus (HPV) DNA test positive 09/2015  . Cholecystitis 2006   GALBLADDER REMOVED  . 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 History of mammogram 11/2014   FIBROADENOMA  . History of Papanicolaou smear of cervix 01/24/13; 10/15/15   -/-; -/+  . Hypertension   . Menorrhagia 02/01/2010   D/C HYSTEROSCOPY ENDO POLYP  . MS (multiple sclerosis) ([place])   . Ovarian cyst 9/15; 2015-2016   LEFT - WENT TO [facility]; RIGHT   Past Surgical History:  Procedure Laterality Date  . BREAST  BIOPSY Right 2016   Fibroadenoma  . CHOLECYSTECTOMY  2006  . DILATION AND CURETTAGE, DIAGNOSTIC / THERAPEUTIC  02/01/2010   D/C HYSTEROSCOPY ENDO POLYP; [facility]  . FLEXIBLE SIGMOIDOSCOPY  07/31/2016   Procedure: FLEXIBLE SIGMOIDOSCOPY;  Surgeon: [name], MD;  Location: [facility];  Service: Endoscopy;;  . HYSTEROSCOPY  02/01/2010   ENDO POLYP - [facility]     A IV Location/Drains/Wounds Patient Lines/Drains/Airways Status   Active Line/Drains/Airways    Name:   Placement date:   Placement time:   Site:   Days:   Peripheral IV 10/27/18 Left Antecubital   10/27/18    1926    Antecubital   1   Peripheral IV 10/28/18 Right Forearm   10/28/18    0122    Forearm   less than 1          Intake/Output Last 24 hours  Intake/Output Summary (Last 24 hours) at 10/28/2018 0219 Last data filed at 10/27/2018 2047 Gross per 24 hour  Intake 1096.82 ml  Output -  Net 1096.82 ml    Labs/Imaging Results for orders placed or performed during the hospital encounter of 10/27/18 (from the past 48 hour(s))  Lipase, blood     Status: None   Collection Time: 10/27/18 12:23 PM  Result Value Ref Range   Lipase 25 11 - 51 U/L    Comment: Performed at [facility], [place] [address], [place], [place] [address]  Comprehensive metabolic panel     Status: Abnormal   Collection Time: 10/27/18 12:23 PM  Result Value Ref Range  Sodium 136 135 - 145 mmol/L   Potassium 3.5 3.5 - 5.1 mmol/L   Chloride 98 98 - 111 mmol/L   CO2 25 22 - 32 mmol/L   Glucose, Bld 120 (H) 70 - 99 mg/dL   BUN 15 6 - 20 mg/dL   Creatinine, Ser 1.07 (H) 0.44 - 1.00 mg/dL   Calcium 8.9 8.9 - 10.3 mg/dL   Total Protein 7.9 6.5 - 8.1 g/dL   Albumin 4.0 3.5 - 5.0 g/dL   AST 24 15 - 41 U/L   ALT 23 0 - 44 U/L   Alkaline Phosphatase 94 38 - 126 U/L   Total Bilirubin 1.2 0.3 - 1.2 mg/dL   GFR calc non Af Amer >60 >60 mL/min   GFR calc Af Amer >60 >60 mL/min   Anion gap 13 5 - 15    Comment: Performed at [facility], [place] [address] [place], [place] [address]  CBC     Status: Abnormal   Collection Time: 10/27/18 12:23 PM  Result Value Ref Range   WBC 15.1 (H) 4.0 - 10.5 K/uL   RBC 4.82 3.87 - 5.11 MIL/uL   Hemoglobin 13.1 12.0 - 15.0 g/dL   HCT 42.7 36.0 - 46.0 %   MCV 88.6 80.0 - 100.0 fL   MCH 27.2 26.0 - 34.0 pg   MCHC 30.7 30.0 - 36.0 g/dL   RDW 15.0 11.5 - 15.5 %   Platelets 444 (H) 150 - 400 K/uL   nRBC 0.0 0.0 - 0.2 %    Comment: Performed at [facility], [place] [address], [place], [place] [address]  I-Stat beta hCG blood, ED     Status: None   Collection Time: 10/27/18 12:24 PM  Result Value Ref Range   I-stat hCG, quantitative <5.0 <5 mIU/mL   Comment 3            Comment:   GEST. AGE      CONC.  (mIU/mL)   <=1 WEEK        5 - 50     2 WEEKS       50 - 500     3 WEEKS       100 - 10,000     4 WEEKS     1,000 - 30,000        FEMALE AND NON-PREGNANT FEMALE:     LESS THAN 5 mIU/mL   Urinalysis, Routine w reflex microscopic     Status: Abnormal   Collection Time: 10/27/18  7:29 PM  Result Value Ref Range   Color, Urine AMBER (A) YELLOW    Comment: BIOCHEMICALS MAY BE AFFECTED BY COLOR   APPearance CLEAR CLEAR   Specific Gravity, Urine >1.046 (H) 1.005 - 1.030   pH 5.0 5.0 - 8.0   Glucose, UA NEGATIVE NEGATIVE mg/dL   Hgb urine dipstick NEGATIVE NEGATIVE   Bilirubin Urine NEGATIVE NEGATIVE   Ketones, ur 20 (A) NEGATIVE mg/dL   Protein, ur NEGATIVE NEGATIVE mg/dL   Nitrite NEGATIVE NEGATIVE   Leukocytes,Ua NEGATIVE NEGATIVE    Comment: Performed at [facility], [place] [address] [place], [place] [address]  SARS Coronavirus 2 (CEPHEID - Performed in [facility] hospital lab), Hosp Order     Status: None   Collection Time: 10/27/18  7:30 PM   Specimen: Nasopharyngeal Swab  Result Value Ref Range   SARS Coronavirus 2 NEGATIVE NEGATIVE    Comment: (NOTE) If result is NEGATIVE SARS-CoV-2 target  nucleic acids are NOT  DETECTED. The SARS-CoV-2 RNA is generally detectable in upper and lower  respiratory specimens during the acute phase of infection. The lowest  concentration of SARS-CoV-2 viral copies this assay can detect is 250  copies / mL. A negative result does not preclude SARS-CoV-2 infection  and should not be used as the sole basis for treatment or other  patient management decisions.  A negative result may occur with  improper specimen collection / handling, submission of specimen other  than nasopharyngeal swab, presence of viral mutation(s) within the  areas targeted by this assay, and inadequate number of viral copies  (<250 copies / mL). A negative result must be combined with clinical  observations, patient history, and epidemiological information. If result is POSITIVE SARS-CoV-2 target nucleic acids are DETECTED. The SARS-CoV-2 RNA is generally detectable in upper and lower  respiratory specimens dur ing the acute phase of infection.  Positive  results are indicative of active infection with SARS-CoV-2.  Clinical  correlation with patient history and other diagnostic information is  necessary to determine patient infection status.  Positive results do  not rule out bacterial infection or co-infection with other viruses. If result is PRESUMPTIVE POSTIVE SARS-CoV-2 nucleic acids MAY BE PRESENT.   A presumptive positive result was obtained on the submitted specimen  and confirmed on repeat testing.  While 2019 novel coronavirus  (SARS-CoV-2) nucleic acids may be present in the submitted sample  additional confirmatory testing may be necessary for epidemiological  and / or clinical management purposes  to differentiate between  SARS-CoV-2 and other Sarbecovirus currently known to infect humans.  If clinically indicated additional testing with an alternate test  methodology [phone]) is advised. The SARS-CoV-2 RNA is generally  detectable in upper and lower respiratory sp ecimens during  the acute  phase of infection. The expected result is Negative. Fact Sheet for Patients:  StrictlyIdeas.no Fact Sheet for Healthcare Providers: BankingDealers.co.za This test is not yet approved or cleared by the [place] FDA and has been authorized for detection and/or diagnosis of SARS-CoV-2 by FDA under an Emergency Use Authorization (EUA).  This EUA will remain in effect (meaning this test can be used) for the duration of the COVID-19 declaration under Section 564(b)(1) of the Act, 21 U.S.C. section 360bbb-3(b)(1), unless the authorization is terminated or revoked sooner. Performed at [facility], [place] [address], [place], [place] [address]    Ct Abdomen Pelvis W Contrast  Result Date: 10/27/2018 CLINICAL DATA:  Severe lower abdominal pain, nausea, vomiting EXAM: CT ABDOMEN AND PELVIS WITH CONTRAST TECHNIQUE: Multidetector CT imaging of the abdomen and pelvis was performed using the standard protocol following bolus administration of intravenous contrast. CONTRAST:  163mL OMNIPAQUE IOHEXOL 300 MG/ML  SOLN COMPARISON:  04/21/2016 FINDINGS: Lower chest: No acute abnormality. Hepatobiliary: No focal liver abnormality is seen. Status post cholecystectomy. No biliary dilatation. Pancreas: Unremarkable. No pancreatic ductal dilatation or surrounding inflammatory changes. Spleen: Normal in size without significant abnormality. Adrenals/Urinary Tract: Adrenal glands are unremarkable. Kidneys are normal, without renal calculi, solid lesion, or hydronephrosis. Bladder is unremarkable. Stomach/Bowel: Stomach is within normal limits. Appendix appears normal. There is extensive fatty mural stratification of the ileum and the majority of the colon, with a very tethered appearance of the distal small bowel loops in the right lower quadrant (series 2, image 67). There is sigmoid diverticulosis with extensive thickening and fat stranding  about the mid sigmoid. There is a tiny focus of extraluminal air anterior to the  mid sigmoid (series 2, image 76). There are additional foci of air in the left upper quadrant mesentery (series 2, image 22, 29). No evidence of abscess. Vascular/Lymphatic: No significant vascular findings are present. No enlarged abdominal or pelvic lymph nodes. Reproductive: IUD is present in the endometrial cavity. Fluid attenuation lesions of the bilateral ovaries consistent with functional cysts or follicles. Other: No abdominal wall hernia or abnormality. Trace ascites in the low abdomen and pelvis. Musculoskeletal: No acute or significant osseous findings. IMPRESSION: 1. There is sigmoid diverticulosis with extensive thickening and fat stranding about the mid sigmoid. There is a tiny focus of extraluminal air anterior to the mid sigmoid (series 2, image 76). Findings are consistent with acute diverticulitis complicated by perforation. There are additional foci of air in the left upper quadrant mesentery (series 2, image 22, 29), which is an unusual location for abdominal air however there is no overt pneumoperitoneum about the liver or diaphragms. No evidence of abscess. 2. There is extensive fatty mural stratification of the ileum and the majority of the colon, with a very tethered appearance of the distal small bowel loops in the right lower quadrant (series 2, image 67). There is sigmoid diverticulosis with extensive thickening and fat stranding about the mid sigmoid. Findings are generally consistent with inflammatory bowel disease, particularly Crohn's disease, without particular findings to suggest acute inflammation. 3. Trace ascites in the low abdomen and pelvis. Electronically Signed   By: Eddie Candle M.D.   On: 10/27/2018 19:05    Pending Labs Unresulted Labs (From admission, onward)    Start     Ordered   10/28/18 0500  HIV antibody (Routine Testing)  Tomorrow morning,   R     10/28/18 0015   10/28/18 0500   Comprehensive metabolic panel  Tomorrow morning,   R     10/28/18 0015   10/28/18 0500  CBC WITH DIFFERENTIAL  Tomorrow morning,   R     10/28/18 0015          Vitals/Pain Today's Vitals   10/27/18 2330 10/28/18 0000 10/28/18 0031 10/28/18 0124  BP: 116/65 115/61  113/65  Pulse: (!) 106 (!) 108  (!) 102  Resp:  20  18  Temp:      TempSrc:      SpO2: 91% 94%  93%  Weight:      Height:      PainSc:  5  5  5      Isolation Precautions No active isolations  Medications Medications  morphine 2 MG/ML injection 2-4 mg (2 mg Intravenous Given 10/27/18 2310)  ondansetron (ZOFRAN) injection 4 mg (has no administration in time range)  sodium chloride 0.9 % 1,000 mL with potassium chloride 10 mEq infusion ( Intravenous New Bag/Given 10/27/18 2247)  acetaminophen (TYLENOL) tablet 650 mg (has no administration in time range)    Or  acetaminophen (TYLENOL) suppository 650 mg (has no administration in time range)  piperacillin-tazobactam (ZOSYN) IVPB 3.375 g (has no administration in time range)  sodium chloride flush (NS) 0.9 % injection 3 mL (3 mLs Intravenous Given 10/28/18 0030)  sodium chloride 0.9 % bolus 1,000 mL (0 mLs Intravenous Stopped 10/27/18 2047)  morphine 4 MG/ML injection 4 mg (4 mg Intravenous Given 10/27/18 1921)  ondansetron (ZOFRAN) injection 4 mg (4 mg Intravenous Given 10/27/18 1921)  iohexol (OMNIPAQUE) 300 MG/ML solution 100 mL (100 mLs Intravenous Contrast Given 10/27/18 1817)  ceFEPIme (MAXIPIME) 2 g in sodium chloride 0.9 % 100 mL IVPB (0 g  Intravenous Stopped 10/27/18 2030)    Mobility walks Low fall risk   Focused Assessments Gi  R Recommendations: See Admitting Provider Note  Report given to: Vaughan Basta RN  Additional Notes:

## 2018-10-28 NOTE — Progress Notes (Signed)
[facility]                                                                              Patient Demographics  Sandra Bonilla, is a 43 y.o. female, DOB - 1975-10-26, MAU:[id number]  Admit date - 10/27/2018   Admitting Physician Vianne Bulls, MD  Outpatient Primary MD for the patient is [name], MD  Outpatient specialists:   LOS - 1  days   Medical records reviewed and are as summarized below:    Chief Complaint  Patient presents with   Abdominal Pain       Brief summary   Patient is a 43 year old female with history of relapsing remitting multiple sclerosis, hypertension, depression with anxiety, and history of diverticulitis with microperforation in January 2018, presented to ED with abdominal pain.  Patient reported pain in the mid to lower abdomen, constant, with mild nausea but no fever chills diarrhea or vomiting. In ED, patient was slightly tachypneic, tachycardia, normal LFTs, leukocytosis 15.1. CT abdomen and pelvis showed acute sigmoid diverticulitis with microperforation.  Patient was placed on IV fluids, cefepime and Flagyl IV.  General surgery was consulted.   Assessment & Plan    Principal Problem: Acute sigmoid  diverticulitis with microperforation -Abdominal pain improving, 6/10, has been n.p.o. on IV pain medications and fluids -Wants to try diet, will now cautiously placed on clear liquids -Continue IV Zosyn.  General surgery following.   Active Problems:   Depression with anxiety -Currently stable    Relapsing remitting multiple sclerosis (HCC) -Currently stable, hold teriflunomide in light of acute infection    Essential hypertension BP currently stable  ?  Inflammatory bowel disease CT abdomen pelvis finding suggested possible IBD, patient had prior flexible sigmoidoscopy in 07/2016 which was negative for IBD.  She will likely need a GI outpatient follow-up and possible colonoscopy.    Code Status: Full CODE  STATUS DVT Prophylaxis:SCD's Family Communication: Discussed in detail with the patient, all imaging results, lab results explained to the patient    Disposition Plan: Continue inpatient status, still has abdominal pain, starting on clears cautiously.  General surgery following.  Time Spent in minutes 25 minutes Procedures:  None  Consultants:   General surgery  Antimicrobials:   Anti-infectives (From admission, onward)   Start     Dose/Rate Route Frequency Ordered Stop   10/28/18 0400  piperacillin-tazobactam (ZOSYN) IVPB 3.375 g     3.375 g 12.5 mL/hr over 240 Minutes Intravenous Every 8 hours 10/27/18 2122     10/28/18 0315  ceFEPIme (MAXIPIME) 2 g in sodium chloride 0.9 % 100 mL IVPB  Status:  Discontinued     2 g 200 mL/hr over 30 Minutes Intravenous Every 8 hours 10/27/18 1932 10/27/18 2056   10/28/18 0315  metroNIDAZOLE (FLAGYL) IVPB 500 mg  Status:  Discontinued     500 mg 100 mL/hr over 60 Minutes Intravenous Every 8 hours 10/27/18 1932 10/27/18 2056   10/27/18 1915  ceFEPIme (MAXIPIME) 2 g in sodium chloride 0.9 % 100 mL IVPB     2 g 200 mL/hr over 30 Minutes Intravenous  Once 10/27/18 1912 10/27/18 2030  10/27/18 1915  metroNIDAZOLE (FLAGYL) IVPB 500 mg  Status:  Discontinued     500 mg 100 mL/hr over 60 Minutes Intravenous  Once 10/27/18 1912 10/27/18 2056          Medications  Scheduled Meds: Continuous Infusions:  piperacillin-tazobactam (ZOSYN)  IV 3.375 g (10/28/18 1226)   PRN Meds:.acetaminophen **OR** acetaminophen, morphine injection, ondansetron (ZOFRAN) IV      Subjective:   Sandra Bonilla was seen and examined today.  Abdominal pain constant, 6/10, no vomiting or fevers.  No vomiting.  Patient denies dizziness, chest pain, shortness of breath, generalized weakness, numbess, tingling.   Objective:   Vitals:   10/28/18 0314 10/28/18 0530 10/28/18 0758 10/28/18 1401  BP: 127/78 111/67 (!) 141/92 112/70  Pulse: 99 (!) 103 85 (!) 108    Resp: 18 18 16 15   Temp: 97.9 F (36.6 C) 98.3 F (36.8 C)  98.6 F (37 C)  TempSrc: Oral Oral  Oral  SpO2: 99% 95% 98% 97%  Weight:      Height:        Intake/Output Summary (Last 24 hours) at 10/28/2018 1513 Last data filed at 10/28/2018 1230 Gross per 24 hour  Intake 1534.59 ml  Output --  Net 1534.59 ml     Wt Readings from Last 3 Encounters:  10/27/18 104.3 kg  09/06/18 105 kg  12/30/17 100.9 kg     Exam  General: Alert and oriented x 3, NAD  Eyes: PERRLA, EOMI, Anicteric Sclera,  HEENT:  Atraumatic, normocephalic, normal oropharynx  Cardiovascular: S1 S2 auscultated, no rubs, murmurs or gallops. Regular rate and rhythm.  Respiratory: Clear to auscultation bilaterally, no wheezing, rales or rhonchi  Gastrointestinal: Soft, diffuse generalized TTP, most severe in mid lower abdomen   Ext: no pedal edema bilaterally  Neuro: No new deficits  Musculoskeletal: No digital cyanosis, clubbing  Skin: No rashes  Psych: Normal affect and demeanor, alert and oriented x3    Data Reviewed:  I have personally reviewed following labs and imaging studies  Micro Results Recent Results (from the past 240 hour(s))  SARS Coronavirus 2 (CEPHEID - Performed in [place] hospital lab), Hosp Order     Status: None   Collection Time: 10/27/18  7:30 PM   Specimen: Nasopharyngeal Swab  Result Value Ref Range Status   SARS Coronavirus 2 NEGATIVE NEGATIVE Final    Comment: (NOTE) If result is NEGATIVE SARS-CoV-2 target nucleic acids are NOT DETECTED. The SARS-CoV-2 RNA is generally detectable in upper and lower  respiratory specimens during the acute phase of infection. The lowest  concentration of SARS-CoV-2 viral copies this assay can detect is 250  copies / mL. A negative result does not preclude SARS-CoV-2 infection  and should not be used as the sole basis for treatment or other  patient management decisions.  A negative result may occur with  improper specimen  collection / handling, submission of specimen other  than nasopharyngeal swab, presence of viral mutation(s) within the  areas targeted by this assay, and inadequate number of viral copies  (<250 copies / mL). A negative result must be combined with clinical  observations, patient history, and epidemiological information. If result is POSITIVE SARS-CoV-2 target nucleic acids are DETECTED. The SARS-CoV-2 RNA is generally detectable in upper and lower  respiratory specimens dur ing the acute phase of infection.  Positive  results are indicative of active infection with SARS-CoV-2.  Clinical  correlation with patient history and other diagnostic information is  necessary to determine  patient infection status.  Positive results do  not rule out bacterial infection or co-infection with other viruses. If result is PRESUMPTIVE POSTIVE SARS-CoV-2 nucleic acids MAY BE PRESENT.   A presumptive positive result was obtained on the submitted specimen  and confirmed on repeat testing.  While 2019 novel coronavirus  (SARS-CoV-2) nucleic acids may be present in the submitted sample  additional confirmatory testing may be necessary for epidemiological  and / or clinical management purposes  to differentiate between  SARS-CoV-2 and other Sarbecovirus currently known to infect humans.  If clinically indicated additional testing with an alternate test  methodology [phone]) is advised. The SARS-CoV-2 RNA is generally  detectable in upper and lower respiratory sp ecimens during the acute  phase of infection. The expected result is Negative. Fact Sheet for Patients:  StrictlyIdeas.no Fact Sheet for Healthcare Providers: BankingDealers.co.za This test is not yet approved or cleared by the [place] FDA and has been authorized for detection and/or diagnosis of SARS-CoV-2 by FDA under an Emergency Use Authorization (EUA).  This EUA will remain in effect  (meaning this test can be used) for the duration of the COVID-19 declaration under Section 564(b)(1) of the Act, 21 U.S.C. section 360bbb-3(b)(1), unless the authorization is terminated or revoked sooner. Performed at [facility], [place] [address] [place], [place] [address]     Radiology Reports Ct Abdomen Pelvis W Contrast  Result Date: 10/27/2018 CLINICAL DATA:  Severe lower abdominal pain, nausea, vomiting EXAM: CT ABDOMEN AND PELVIS WITH CONTRAST TECHNIQUE: Multidetector CT imaging of the abdomen and pelvis was performed using the standard protocol following bolus administration of intravenous contrast. CONTRAST:  123mL OMNIPAQUE IOHEXOL 300 MG/ML  SOLN COMPARISON:  04/21/2016 FINDINGS: Lower chest: No acute abnormality. Hepatobiliary: No focal liver abnormality is seen. Status post cholecystectomy. No biliary dilatation. Pancreas: Unremarkable. No pancreatic ductal dilatation or surrounding inflammatory changes. Spleen: Normal in size without significant abnormality. Adrenals/Urinary Tract: Adrenal glands are unremarkable. Kidneys are normal, without renal calculi, solid lesion, or hydronephrosis. Bladder is unremarkable. Stomach/Bowel: Stomach is within normal limits. Appendix appears normal. There is extensive fatty mural stratification of the ileum and the majority of the colon, with a very tethered appearance of the distal small bowel loops in the right lower quadrant (series 2, image 67). There is sigmoid diverticulosis with extensive thickening and fat stranding about the mid sigmoid. There is a tiny focus of extraluminal air anterior to the mid sigmoid (series 2, image 76). There are additional foci of air in the left upper quadrant mesentery (series 2, image 22, 29). No evidence of abscess. Vascular/Lymphatic: No significant vascular findings are present. No enlarged abdominal or pelvic lymph nodes. Reproductive: IUD is present in the endometrial cavity. Fluid attenuation  lesions of the bilateral ovaries consistent with functional cysts or follicles. Other: No abdominal wall hernia or abnormality. Trace ascites in the low abdomen and pelvis. Musculoskeletal: No acute or significant osseous findings. IMPRESSION: 1. There is sigmoid diverticulosis with extensive thickening and fat stranding about the mid sigmoid. There is a tiny focus of extraluminal air anterior to the mid sigmoid (series 2, image 76). Findings are consistent with acute diverticulitis complicated by perforation. There are additional foci of air in the left upper quadrant mesentery (series 2, image 22, 29), which is an unusual location for abdominal air however there is no overt pneumoperitoneum about the liver or diaphragms. No evidence of abscess. 2. There is extensive fatty mural stratification of the ileum and the majority of the colon,  with a very tethered appearance of the distal small bowel loops in the right lower quadrant (series 2, image 67). There is sigmoid diverticulosis with extensive thickening and fat stranding about the mid sigmoid. Findings are generally consistent with inflammatory bowel disease, particularly Crohn's disease, without particular findings to suggest acute inflammation. 3. Trace ascites in the low abdomen and pelvis. Electronically Signed   By: Eddie Candle M.D.   On: 10/27/2018 19:05    Lab Data:  CBC: Recent Labs  Lab 10/27/18 1223 10/28/18 0312  WBC 15.1* 20.4*  NEUTROABS  --  16.6*  HGB 13.1 13.0  HCT 42.7 42.3  MCV 88.6 90.2  PLT 444* 830   Basic Metabolic Panel: Recent Labs  Lab 10/27/18 1223 10/28/18 0312  NA 136 137  K 3.5 3.2*  CL 98 101  CO2 25 24  GLUCOSE 120* 112*  BUN 15 15  CREATININE 1.07* 0.93  CALCIUM 8.9 8.1*   GFR: Estimated Creatinine Clearance: 93.2 mL/min (by C-G formula based on SCr of 0.93 mg/dL). Liver Function Tests: Recent Labs  Lab 10/27/18 1223 10/28/18 0312  AST 24 23  ALT 23 20  ALKPHOS 94 81  BILITOT 1.2 1.4*    PROT 7.9 7.3  ALBUMIN 4.0 3.4*   Recent Labs  Lab 10/27/18 1223  LIPASE 25   No results for input(s): AMMONIA in the last 168 hours. Coagulation Profile: No results for input(s): INR, PROTIME in the last 168 hours. Cardiac Enzymes: No results for input(s): CKTOTAL, CKMB, CKMBINDEX, TROPONINI in the last 168 hours. BNP (last 3 results) No results for input(s): PROBNP in the last 8760 hours. HbA1C: No results for input(s): HGBA1C in the last 72 hours. CBG: No results for input(s): GLUCAP in the last 168 hours. Lipid Profile: No results for input(s): CHOL, HDL, LDLCALC, TRIG, CHOLHDL, LDLDIRECT in the last 72 hours. Thyroid Function Tests: No results for input(s): TSH, T4TOTAL, FREET4, T3FREE, THYROIDAB in the last 72 hours. Anemia Panel: No results for input(s): VITAMINB12, FOLATE, FERRITIN, TIBC, IRON, RETICCTPCT in the last 72 hours. Urine analysis:    Component Value Date/Time   COLORURINE AMBER (A) 10/27/2018 1929   APPEARANCEUR CLEAR 10/27/2018 1929   LABSPEC >1.046 (H) 10/27/2018 1929   PHURINE 5.0 10/27/2018 1929   GLUCOSEU NEGATIVE 10/27/2018 1929   HGBUR NEGATIVE 10/27/2018 1929   BILIRUBINUR NEGATIVE 10/27/2018 1929   KETONESUR 20 (A) 10/27/2018 1929   PROTEINUR NEGATIVE 10/27/2018 1929   UROBILINOGEN 0.2 12/12/2013 2356   NITRITE NEGATIVE 10/27/2018 1929   LEUKOCYTESUR NEGATIVE 10/27/2018 1929     Amanada Philbrick M.D. [facility] 10/28/2018, 3:13 PM  Pager: [phone] Between 7am to 7pm - call Pager - [phone]  After 7pm go to [email or web address] - password [email or web address]  Call night coverage person covering after 7pm

## 2018-10-28 NOTE — Plan of Care (Signed)
Care plan initiated.

## 2018-10-28 NOTE — ED Notes (Signed)
Call to give report nurse will call back in 15 minutes.

## 2018-10-29 LAB — BASIC METABOLIC PANEL
Anion gap: 14 (ref 5–15)
BUN: 12 mg/dL (ref 6–20)
CO2: 24 mmol/L (ref 22–32)
Calcium: 7.5 mg/dL — ABNORMAL LOW (ref 8.9–10.3)
Chloride: 99 mmol/L (ref 98–111)
Creatinine, Ser: 0.82 mg/dL (ref 0.44–1.00)
GFR calc Af Amer: >60 mL/min (ref >60)
GFR calc non Af Amer: >60 mL/min (ref >60)
Glucose, Bld: 114 mg/dL — ABNORMAL HIGH (ref 70–99)
Potassium: 3 mmol/L — ABNORMAL LOW (ref 3.5–5.1)
Sodium: 137 mmol/L (ref 135–145)

## 2018-10-29 LAB — CBC
HCT: 39.4 % (ref 36.0–46.0)
Hemoglobin: 12.4 g/dL (ref 12.0–15.0)
MCH: 28.1 pg (ref 26.0–34.0)
MCHC: 31.5 g/dL (ref 30.0–36.0)
MCV: 89.3 fL (ref 80.0–100.0)
Platelets: 294 10*3/uL (ref 150–400)
RBC: 4.41 MIL/uL (ref 3.87–5.11)
RDW: 15 % (ref 11.5–15.5)
WBC: 20.4 10*3/uL — ABNORMAL HIGH (ref 4.0–10.5)
nRBC: 0 % (ref 0.0–0.2)

## 2018-10-29 MED ORDER — LAMOTRIGINE 100 MG PO TABS
400.0000 mg | ORAL_TABLET | Freq: Every day | ORAL | Status: DC
  Administered 2018-10-29 – 2018-11-05 (×8): 400 mg via ORAL
  Filled 2018-10-29 (×8): qty 4

## 2018-10-29 MED ORDER — TERIFLUNOMIDE 14 MG PO TABS: 1.0000 | ORAL_TABLET | Freq: Every day | ORAL | Status: DC

## 2018-10-29 MED ORDER — SODIUM CHLORIDE 0.9 % IV SOLN
INTRAVENOUS | Status: DC | PRN
  Administered 2018-10-29: 1000 mL via INTRAVENOUS

## 2018-10-29 MED ORDER — POTASSIUM CHLORIDE CRYS ER 20 MEQ PO TBCR
40.0000 meq | EXTENDED_RELEASE_TABLET | Freq: Once | ORAL | Status: AC
  Administered 2018-10-29: 40 meq via ORAL
  Filled 2018-10-29: qty 2

## 2018-10-29 MED ORDER — OXYBUTYNIN CHLORIDE 5 MG PO TABS
5.0000 mg | ORAL_TABLET | Freq: Two times a day (BID) | ORAL | Status: DC
  Administered 2018-10-29 – 2018-11-05 (×15): 5 mg via ORAL
  Filled 2018-10-29 (×16): qty 1

## 2018-10-29 MED ORDER — ESCITALOPRAM OXALATE 20 MG PO TABS
20.0000 mg | ORAL_TABLET | Freq: Every day | ORAL | Status: DC
  Administered 2018-10-29 – 2018-11-05 (×8): 20 mg via ORAL
  Filled 2018-10-29 (×8): qty 1

## 2018-10-29 NOTE — Progress Notes (Signed)
Assessment & Plan: HD#2 - sigmoid diverticulitis with microperforation  IV Zosyn  Clear liquid diet  WBC stable at 20K this AM  Encouraged OOB, ambulation  Likely repeat CT scan early next week   Discussed medical management versus potential need for urgent surgical management with ex lap, sigmoid colectomy, colostomy, and open abdominal wound.  Will monitor closely with you.        Armandina Gemma, MD       [facility]       Office: [phone]   Chief Complaint: Diverticulitis with microperforation  Subjective: Patient in bed, mild pain.  No nausea.  Tolerating clear liquid diet.  Objective: Vital signs in last 24 hours: Temp:  [98.4 F (36.9 C)-100.2 F (37.9 C)] 98.4 F (36.9 C) (07/31 0539) Pulse Rate:  [82-108] 82 (07/31 0539) Resp:  [15-16] 16 (07/31 0539) BP: (112-123)/(67-71) 115/67 (07/31 0539) SpO2:  [93 %-97 %] 95 % (07/31 0539) Last BM Date: 10/28/18  Intake/Output from previous day: 07/30 0701 - 07/31 0700 In: 546.1 [P.O.:420; IV Piggyback:126.1] Out: -  Intake/Output this shift: Total I/O In: 60 [P.O.:60] Out: -   Physical Exam: HEENT - sclerae clear, mucous membranes moist Neck - soft Chest - clear bilaterally Cor - RRR Abdomen - soft, obese; mild diffuse tenderness, max in LLQ; no mass Ext - no edema, non-tender Neuro - alert & oriented, no focal deficits  Lab Results:  Recent Labs    10/28/18 0312 10/29/18 0253  WBC 20.4* 20.4*  HGB 13.0 12.4  HCT 42.3 39.4  PLT 363 294   BMET Recent Labs    10/28/18 0312 10/29/18 0253  NA 137 137  K 3.2* 3.0*  CL 101 99  CO2 24 24  GLUCOSE 112* 114*  BUN 15 12  CREATININE 0.93 0.82  CALCIUM 8.1* 7.5*   PT/INR No results for input(s): LABPROT, INR in the last 72 hours. Comprehensive Metabolic Panel:    Component Value Date/Time   NA 137 10/29/2018 0253   NA 137 10/28/2018 0312   NA 141 02/05/2016 1533   K 3.0 (L) 10/29/2018 0253   K 3.2 (L) 10/28/2018 0312   CL 99 10/29/2018 0253   CL 101 10/28/2018 0312   CO2 24 10/29/2018 0253   CO2 24 10/28/2018 0312   BUN 12 10/29/2018 0253   BUN 15 10/28/2018 0312   BUN 9 02/05/2016 1533   CREATININE 0.82 10/29/2018 0253   CREATININE 0.93 10/28/2018 0312   GLUCOSE 114 (H) 10/29/2018 0253   GLUCOSE 112 (H) 10/28/2018 0312   CALCIUM 7.5 (L) 10/29/2018 0253   CALCIUM 8.1 (L) 10/28/2018 0312   AST 23 10/28/2018 0312   AST 24 10/27/2018 1223   ALT 20 10/28/2018 0312   ALT 23 10/27/2018 1223   ALKPHOS 81 10/28/2018 0312   ALKPHOS 94 10/27/2018 1223   BILITOT 1.4 (H) 10/28/2018 0312   BILITOT 1.2 10/27/2018 1223   BILITOT 0.7 12/30/2017 0905   BILITOT 0.7 06/29/2017 0845   PROT 7.3 10/28/2018 0312   PROT 7.9 10/27/2018 1223   PROT 6.6 12/30/2017 0905   PROT 6.9 06/29/2017 0845   ALBUMIN 3.4 (L) 10/28/2018 0312   ALBUMIN 4.0 10/27/2018 1223   ALBUMIN 4.2 12/30/2017 0905   ALBUMIN 4.2 06/29/2017 0845    Studies/Results: Ct Abdomen Pelvis W Contrast  Result Date: 10/27/2018 CLINICAL DATA:  Severe lower abdominal pain, nausea, vomiting EXAM: CT ABDOMEN AND PELVIS WITH CONTRAST TECHNIQUE: Multidetector CT imaging of the abdomen  and pelvis was performed using the standard protocol following bolus administration of intravenous contrast. CONTRAST:  163mL OMNIPAQUE IOHEXOL 300 MG/ML  SOLN COMPARISON:  04/21/2016 FINDINGS: Lower chest: No acute abnormality. Hepatobiliary: No focal liver abnormality is seen. Status post cholecystectomy. No biliary dilatation. Pancreas: Unremarkable. No pancreatic ductal dilatation or surrounding inflammatory changes. Spleen: Normal in size without significant abnormality. Adrenals/Urinary Tract: Adrenal glands are unremarkable. Kidneys are normal, without renal calculi, solid lesion, or hydronephrosis. Bladder is unremarkable. Stomach/Bowel: Stomach is within normal limits. Appendix appears normal. There is extensive fatty mural stratification of the ileum and the majority of the  colon, with a very tethered appearance of the distal small bowel loops in the right lower quadrant (series 2, image 67). There is sigmoid diverticulosis with extensive thickening and fat stranding about the mid sigmoid. There is a tiny focus of extraluminal air anterior to the mid sigmoid (series 2, image 76). There are additional foci of air in the left upper quadrant mesentery (series 2, image 22, 29). No evidence of abscess. Vascular/Lymphatic: No significant vascular findings are present. No enlarged abdominal or pelvic lymph nodes. Reproductive: IUD is present in the endometrial cavity. Fluid attenuation lesions of the bilateral ovaries consistent with functional cysts or follicles. Other: No abdominal wall hernia or abnormality. Trace ascites in the low abdomen and pelvis. Musculoskeletal: No acute or significant osseous findings. IMPRESSION: 1. There is sigmoid diverticulosis with extensive thickening and fat stranding about the mid sigmoid. There is a tiny focus of extraluminal air anterior to the mid sigmoid (series 2, image 76). Findings are consistent with acute diverticulitis complicated by perforation. There are additional foci of air in the left upper quadrant mesentery (series 2, image 22, 29), which is an unusual location for abdominal air however there is no overt pneumoperitoneum about the liver or diaphragms. No evidence of abscess. 2. There is extensive fatty mural stratification of the ileum and the majority of the colon, with a very tethered appearance of the distal small bowel loops in the right lower quadrant (series 2, image 67). There is sigmoid diverticulosis with extensive thickening and fat stranding about the mid sigmoid. Findings are generally consistent with inflammatory bowel disease, particularly Crohn's disease, without particular findings to suggest acute inflammation. 3. Trace ascites in the low abdomen and pelvis. Electronically Signed   By: Eddie Candle M.D.   On: 10/27/2018  19:05      Armandina Gemma 10/29/2018  Patient ID: Sandra Bonilla, female   DOB: 11-12-1975, 43 y.o.   MRN: [id number]

## 2018-10-29 NOTE — Progress Notes (Signed)
[facility]                                                                              Patient Demographics  Sandra Bonilla, is a 43 y.o. female, DOB - 31-Aug-1975, DGL:[id number]  Admit date - 10/27/2018   Admitting Physician Vianne Bulls, MD  Outpatient Primary MD for the patient is [name], MD  Outpatient specialists:   LOS - 2  days   Medical records reviewed and are as summarized below:    Chief Complaint  Patient presents with   Abdominal Pain       Brief summary   Patient is a 43 year old female with history of relapsing remitting multiple sclerosis, hypertension, depression with anxiety, and history of diverticulitis with microperforation in January 2018, presented to ED with abdominal pain.  Patient reported pain in the mid to lower abdomen, constant, with mild nausea but no fever chills diarrhea or vomiting. In ED, patient was slightly tachypneic, tachycardia, normal LFTs, leukocytosis 15.1. CT abdomen and pelvis showed acute sigmoid diverticulitis with microperforation.  Patient was placed on IV fluids, cefepime and Flagyl IV.  General surgery was consulted.   Assessment & Plan    Principal Problem: Acute sigmoid  diverticulitis with microperforation -Abdominal pain still 6-7/10, with nausea but no vomiting or any diarrhea.  -Tolerating clear liquids, hold off on advancing diet -Continue IV Zosyn.  General surgery following.   Active Problems:   Depression with anxiety -Restart Lexapro, Lamictal    Relapsing remitting multiple sclerosis (HCC) -Currently stable, hold teriflunomide in light of acute infection    Essential hypertension BP currently stable, hold off on adding antihypertensives  ?  Inflammatory bowel disease CT abdomen pelvis finding suggested possible IBD, patient had prior flexible sigmoidoscopy in 07/2016 which was negative for IBD.  She will likely need a GI outpatient follow-up and possible colonoscopy.     Code Status: Full CODE STATUS DVT Prophylaxis:SCD's Family Communication: Discussed in detail with the patient, all imaging results, lab results explained to the patient    Disposition Plan: Continue inpatient status, still has abdominal pain, not on solids yet.    Time Spent in minutes 25 minutes  Procedures:  None  Consultants:   General surgery  Antimicrobials:   Anti-infectives (From admission, onward)   Start     Dose/Rate Route Frequency Ordered Stop   10/28/18 0400  piperacillin-tazobactam (ZOSYN) IVPB 3.375 g     3.375 g 12.5 mL/hr over 240 Minutes Intravenous Every 8 hours 10/27/18 2122     10/28/18 0315  ceFEPIme (MAXIPIME) 2 g in sodium chloride 0.9 % 100 mL IVPB  Status:  Discontinued     2 g 200 mL/hr over 30 Minutes Intravenous Every 8 hours 10/27/18 1932 10/27/18 2056   10/28/18 0315  metroNIDAZOLE (FLAGYL) IVPB 500 mg  Status:  Discontinued     500 mg 100 mL/hr over 60 Minutes Intravenous Every 8 hours 10/27/18 1932 10/27/18 2056   10/27/18 1915  ceFEPIme (MAXIPIME) 2 g in sodium chloride 0.9 % 100 mL IVPB     2 g 200 mL/hr over 30 Minutes Intravenous  Once 10/27/18 1912  10/27/18 2030   10/27/18 1915  metroNIDAZOLE (FLAGYL) IVPB 500 mg  Status:  Discontinued     500 mg 100 mL/hr over 60 Minutes Intravenous  Once 10/27/18 1912 10/27/18 2056         Medications  Scheduled Meds:  escitalopram  20 mg Oral Daily   lamoTRIgine  400 mg Oral Daily   oxybutynin  5 mg Oral BID   Continuous Infusions:  sodium chloride 1,000 mL (10/29/18 0700)   piperacillin-tazobactam (ZOSYN)  IV 3.375 g (10/29/18 1140)   PRN Meds:.sodium chloride, acetaminophen **OR** acetaminophen, morphine injection, ondansetron (ZOFRAN) IV      Subjective:   Sandra Bonilla was seen and examined today.  Still abdominal pain, worsened right lower quadrant, 7/10.  No fevers or vomiting.  No diarrhea.  Patient denies dizziness, chest pain, shortness of breath, generalized  weakness, numbess, tingling.   Objective:   Vitals:   10/28/18 2056 10/28/18 2237 10/28/18 2300 10/29/18 0539  BP: 123/71   115/67  Pulse: (!) 108   82  Resp: 16   16  Temp: 100.2 F (37.9 C) 98.9 F (37.2 C) 98.6 F (37 C) 98.4 F (36.9 C)  TempSrc: Oral  Oral   SpO2: 93%   95%  Weight:      Height:        Intake/Output Summary (Last 24 hours) at 10/29/2018 1351 Last data filed at 10/29/2018 1342 Gross per 24 hour  Intake 761.49 ml  Output --  Net 761.49 ml     Wt Readings from Last 3 Encounters:  10/27/18 104.3 kg  09/06/18 105 kg  12/30/17 100.9 kg   Physical Exam  General: Alert and oriented x 3, NAD  Eyes:   HEENT:  Atraumatic, normocephalic  Cardiovascular: S1 S2 clear, no murmurs, RRR. No pedal edema b/l  Respiratory: CTAB, no wheezing, rales or rhonchi  Gastrointestinal: Soft, diffuse generalized TTP, worse in the right lower quadrant and lower abdomen   Ext: no pedal edema bilaterally  Neuro: no new deficits  Musculoskeletal: No cyanosis, clubbing  Skin: No rashes  Psych: Normal affect and demeanor, alert and oriented x3      Data Reviewed:  I have personally reviewed following labs and imaging studies  Micro Results Recent Results (from the past 240 hour(s))  SARS Coronavirus 2 (CEPHEID - Performed in [place] hospital lab), Hosp Order     Status: None   Collection Time: 10/27/18  7:30 PM   Specimen: Nasopharyngeal Swab  Result Value Ref Range Status   SARS Coronavirus 2 NEGATIVE NEGATIVE Final    Comment: (NOTE) If result is NEGATIVE SARS-CoV-2 target nucleic acids are NOT DETECTED. The SARS-CoV-2 RNA is generally detectable in upper and lower  respiratory specimens during the acute phase of infection. The lowest  concentration of SARS-CoV-2 viral copies this assay can detect is 250  copies / mL. A negative result does not preclude SARS-CoV-2 infection  and should not be used as the sole basis for treatment or other  patient  management decisions.  A negative result may occur with  improper specimen collection / handling, submission of specimen other  than nasopharyngeal swab, presence of viral mutation(s) within the  areas targeted by this assay, and inadequate number of viral copies  (<250 copies / mL). A negative result must be combined with clinical  observations, patient history, and epidemiological information. If result is POSITIVE SARS-CoV-2 target nucleic acids are DETECTED. The SARS-CoV-2 RNA is generally detectable in upper and lower  respiratory specimens dur ing the acute phase of infection.  Positive  results are indicative of active infection with SARS-CoV-2.  Clinical  correlation with patient history and other diagnostic information is  necessary to determine patient infection status.  Positive results do  not rule out bacterial infection or co-infection with other viruses. If result is PRESUMPTIVE POSTIVE SARS-CoV-2 nucleic acids MAY BE PRESENT.   A presumptive positive result was obtained on the submitted specimen  and confirmed on repeat testing.  While 2019 novel coronavirus  (SARS-CoV-2) nucleic acids may be present in the submitted sample  additional confirmatory testing may be necessary for epidemiological  and / or clinical management purposes  to differentiate between  SARS-CoV-2 and other Sarbecovirus currently known to infect humans.  If clinically indicated additional testing with an alternate test  methodology [id number]) is advised. The SARS-CoV-2 RNA is generally  detectable in upper and lower respiratory sp ecimens during the acute  phase of infection. The expected result is Negative. Fact Sheet for Patients:  StrictlyIdeas.no Fact Sheet for Healthcare Providers: BankingDealers.co.za This test is not yet approved or cleared by the [place] FDA and has been authorized for detection and/or diagnosis of SARS-CoV-2 by FDA under  an Emergency Use Authorization (EUA).  This EUA will remain in effect (meaning this test can be used) for the duration of the COVID-19 declaration under Section 564(b)(1) of the Act, 21 U.S.C. section 360bbb-3(b)(1), unless the authorization is terminated or revoked sooner. Performed at [facility], [place] [address], [place], [place] [address]     Radiology Reports Ct Abdomen Pelvis W Contrast  Result Date: 10/27/2018 CLINICAL DATA:  Severe lower abdominal pain, nausea, vomiting EXAM: CT ABDOMEN AND PELVIS WITH CONTRAST TECHNIQUE: Multidetector CT imaging of the abdomen and pelvis was performed using the standard protocol following bolus administration of intravenous contrast. CONTRAST:  185mL OMNIPAQUE IOHEXOL 300 MG/ML  SOLN COMPARISON:  04/21/2016 FINDINGS: Lower chest: No acute abnormality. Hepatobiliary: No focal liver abnormality is seen. Status post cholecystectomy. No biliary dilatation. Pancreas: Unremarkable. No pancreatic ductal dilatation or surrounding inflammatory changes. Spleen: Normal in size without significant abnormality. Adrenals/Urinary Tract: Adrenal glands are unremarkable. Kidneys are normal, without renal calculi, solid lesion, or hydronephrosis. Bladder is unremarkable. Stomach/Bowel: Stomach is within normal limits. Appendix appears normal. There is extensive fatty mural stratification of the ileum and the majority of the colon, with a very tethered appearance of the distal small bowel loops in the right lower quadrant (series 2, image 67). There is sigmoid diverticulosis with extensive thickening and fat stranding about the mid sigmoid. There is a tiny focus of extraluminal air anterior to the mid sigmoid (series 2, image 76). There are additional foci of air in the left upper quadrant mesentery (series 2, image 22, 29). No evidence of abscess. Vascular/Lymphatic: No significant vascular findings are present. No enlarged abdominal or pelvic lymph nodes.  Reproductive: IUD is present in the endometrial cavity. Fluid attenuation lesions of the bilateral ovaries consistent with functional cysts or follicles. Other: No abdominal wall hernia or abnormality. Trace ascites in the low abdomen and pelvis. Musculoskeletal: No acute or significant osseous findings. IMPRESSION: 1. There is sigmoid diverticulosis with extensive thickening and fat stranding about the mid sigmoid. There is a tiny focus of extraluminal air anterior to the mid sigmoid (series 2, image 76). Findings are consistent with acute diverticulitis complicated by perforation. There are additional foci of air in the left upper quadrant mesentery (series 2, image 22, 29), which is an  unusual location for abdominal air however there is no overt pneumoperitoneum about the liver or diaphragms. No evidence of abscess. 2. There is extensive fatty mural stratification of the ileum and the majority of the colon, with a very tethered appearance of the distal small bowel loops in the right lower quadrant (series 2, image 67). There is sigmoid diverticulosis with extensive thickening and fat stranding about the mid sigmoid. Findings are generally consistent with inflammatory bowel disease, particularly Crohn's disease, without particular findings to suggest acute inflammation. 3. Trace ascites in the low abdomen and pelvis. Electronically Signed   By: Eddie Candle M.D.   On: 10/27/2018 19:05    Lab Data:  CBC: Recent Labs  Lab 10/27/18 1223 10/28/18 0312 10/29/18 0253  WBC 15.1* 20.4* 20.4*  NEUTROABS  --  16.6*  --   HGB 13.1 13.0 12.4  HCT 42.7 42.3 39.4  MCV 88.6 90.2 89.3  PLT 444* 363 734   Basic Metabolic Panel: Recent Labs  Lab 10/27/18 1223 10/28/18 0312 10/29/18 0253  NA 136 137 137  K 3.5 3.2* 3.0*  CL 98 101 99  CO2 25 24 24   GLUCOSE 120* 112* 114*  BUN 15 15 12   CREATININE 1.07* 0.93 0.82  CALCIUM 8.9 8.1* 7.5*   GFR: Estimated Creatinine Clearance: 105.7 mL/min (by C-G  formula based on SCr of 0.82 mg/dL). Liver Function Tests: Recent Labs  Lab 10/27/18 1223 10/28/18 0312  AST 24 23  ALT 23 20  ALKPHOS 94 81  BILITOT 1.2 1.4*  PROT 7.9 7.3  ALBUMIN 4.0 3.4*   Recent Labs  Lab 10/27/18 1223  LIPASE 25   No results for input(s): AMMONIA in the last 168 hours. Coagulation Profile: No results for input(s): INR, PROTIME in the last 168 hours. Cardiac Enzymes: No results for input(s): CKTOTAL, CKMB, CKMBINDEX, TROPONINI in the last 168 hours. BNP (last 3 results) No results for input(s): PROBNP in the last 8760 hours. HbA1C: No results for input(s): HGBA1C in the last 72 hours. CBG: No results for input(s): GLUCAP in the last 168 hours. Lipid Profile: No results for input(s): CHOL, HDL, LDLCALC, TRIG, CHOLHDL, LDLDIRECT in the last 72 hours. Thyroid Function Tests: No results for input(s): TSH, T4TOTAL, FREET4, T3FREE, THYROIDAB in the last 72 hours. Anemia Panel: No results for input(s): VITAMINB12, FOLATE, FERRITIN, TIBC, IRON, RETICCTPCT in the last 72 hours. Urine analysis:    Component Value Date/Time   COLORURINE AMBER (A) 10/27/2018 1929   APPEARANCEUR CLEAR 10/27/2018 1929   LABSPEC >1.046 (H) 10/27/2018 1929   PHURINE 5.0 10/27/2018 1929   GLUCOSEU NEGATIVE 10/27/2018 1929   HGBUR NEGATIVE 10/27/2018 1929   BILIRUBINUR NEGATIVE 10/27/2018 1929   KETONESUR 20 (A) 10/27/2018 1929   PROTEINUR NEGATIVE 10/27/2018 1929   UROBILINOGEN 0.2 12/12/2013 2356   NITRITE NEGATIVE 10/27/2018 1929   LEUKOCYTESUR NEGATIVE 10/27/2018 1929     Zykeriah Mathia M.D. [facility] 10/29/2018, 1:51 PM  Pager: [phone] Between 7am to 7pm - call Pager - [phone]  After 7pm go to [email or web address] - password [email or web address]  Call night coverage person covering after 7pm

## 2018-10-30 LAB — BASIC METABOLIC PANEL
Anion gap: 12 (ref 5–15)
BUN: 11 mg/dL (ref 6–20)
CO2: 27 mmol/L (ref 22–32)
Calcium: 7.7 mg/dL — ABNORMAL LOW (ref 8.9–10.3)
Chloride: 96 mmol/L — ABNORMAL LOW (ref 98–111)
Creatinine, Ser: 0.8 mg/dL (ref 0.44–1.00)
GFR calc Af Amer: >60 mL/min (ref >60)
GFR calc non Af Amer: >60 mL/min (ref >60)
Glucose, Bld: 87 mg/dL (ref 70–99)
Potassium: 3 mmol/L — ABNORMAL LOW (ref 3.5–5.1)
Sodium: 135 mmol/L (ref 135–145)

## 2018-10-30 LAB — CBC
HCT: 38.4 % (ref 36.0–46.0)
Hemoglobin: 11.9 g/dL — ABNORMAL LOW (ref 12.0–15.0)
MCH: 27.6 pg (ref 26.0–34.0)
MCHC: 31 g/dL (ref 30.0–36.0)
MCV: 89.1 fL (ref 80.0–100.0)
Platelets: 335 10*3/uL (ref 150–400)
RBC: 4.31 MIL/uL (ref 3.87–5.11)
RDW: 15.1 % (ref 11.5–15.5)
WBC: 17.4 10*3/uL — ABNORMAL HIGH (ref 4.0–10.5)
nRBC: 0 % (ref 0.0–0.2)

## 2018-10-30 MED ORDER — SODIUM CHLORIDE 0.9 % IV SOLN: INTRAVENOUS | Status: DC | PRN

## 2018-10-30 NOTE — Progress Notes (Signed)
[facility]                                                                              Patient Demographics  Sandra Bonilla, is a 43 y.o. female, DOB - 1975-11-13, MGQ:[id number]  Admit date - 10/27/2018   Admitting Physician Vianne Bulls, MD  Outpatient Primary MD for the patient is [name], MD  Outpatient specialists:   LOS - 3  days   Medical records reviewed and are as summarized below:    Chief Complaint  Patient presents with   Abdominal Pain       Brief summary   Patient is a 43 year old female with history of relapsing remitting multiple sclerosis, hypertension, depression with anxiety, and history of diverticulitis with microperforation in January 2018, presented to ED with abdominal pain.  Patient reported pain in the mid to lower abdomen, constant, with mild nausea but no fever chills diarrhea or vomiting. In ED, patient was slightly tachypneic, tachycardia, normal LFTs, leukocytosis 15.1. CT abdomen and pelvis showed acute sigmoid diverticulitis with microperforation.  Patient was placed on IV fluids, cefepime and Flagyl IV.  General surgery was consulted.   Assessment & Plan    Principal Problem: Acute sigmoid  diverticulitis with microperforation -No vomiting or diarrhea however still has abdominal pain slightly better from yesterday, 5/10 - will continue clears for now, hold off on advancing the diet.   -Continue IV Zosyn, general surgery following, recommended repeat CT scan early next week -Encourage ambulation and out of bed  Active Problems:   Depression with anxiety -Continue Lexapro, Lamictal    Relapsing remitting multiple sclerosis (HCC) -Currently stable, hold teriflunomide in light of acute infection  Essential hypertension BP stable, holding off on antihypertensives  ?  Inflammatory bowel disease CT abdomen pelvis finding suggested possible IBD, patient had prior flexible sigmoidoscopy in 07/2016 which was  negative for IBD.  She will likely need a GI outpatient follow-up and possible colonoscopy.    Code Status: Full CODE STATUS DVT Prophylaxis:SCD's Family Communication: Discussed in detail with the patient, all imaging results, lab results explained to the patient    Disposition Plan: Continue inpatient status, will need repeat CT scan to assess improvement early next week  Time Spent in minutes 25 minutes  Procedures:  None  Consultants:   General surgery  Antimicrobials:   Anti-infectives (From admission, onward)   Start     Dose/Rate Route Frequency Ordered Stop   10/28/18 0400  piperacillin-tazobactam (ZOSYN) IVPB 3.375 g     3.375 g 12.5 mL/hr over 240 Minutes Intravenous Every 8 hours 10/27/18 2122     10/28/18 0315  ceFEPIme (MAXIPIME) 2 g in sodium chloride 0.9 % 100 mL IVPB  Status:  Discontinued     2 g 200 mL/hr over 30 Minutes Intravenous Every 8 hours 10/27/18 1932 10/27/18 2056   10/28/18 0315  metroNIDAZOLE (FLAGYL) IVPB 500 mg  Status:  Discontinued     500 mg 100 mL/hr over 60 Minutes Intravenous Every 8 hours 10/27/18 1932 10/27/18 2056   10/27/18 1915  ceFEPIme (MAXIPIME) 2 g in sodium chloride 0.9 % 100 mL IVPB  2 g 200 mL/hr over 30 Minutes Intravenous  Once 10/27/18 1912 10/27/18 2030   10/27/18 1915  metroNIDAZOLE (FLAGYL) IVPB 500 mg  Status:  Discontinued     500 mg 100 mL/hr over 60 Minutes Intravenous  Once 10/27/18 1912 10/27/18 2056         Medications  Scheduled Meds:  escitalopram  20 mg Oral Daily   lamoTRIgine  400 mg Oral Daily   oxybutynin  5 mg Oral BID   Continuous Infusions:  sodium chloride     piperacillin-tazobactam (ZOSYN)  IV 3.375 g (10/30/18 1221)   PRN Meds:.sodium chloride, acetaminophen **OR** acetaminophen, morphine injection, ondansetron (ZOFRAN) IV      Subjective:   Joceline Hinchcliff was seen and examined today.  Abdominal pain 5/10, no nausea vomiting or diarrhea.  No fevers this morning. Patient  denies dizziness, chest pain, shortness of breath, generalized weakness, numbess, tingling.   Objective:   Vitals:   10/29/18 0539 10/29/18 1607 10/29/18 2148 10/30/18 0413  BP: 115/67 116/71 121/77 128/72  Pulse: 82 100 95 93  Resp: 16 17 20 20   Temp: 98.4 F (36.9 C) 98.7 F (37.1 C) 98.6 F (37 C) 98 F (36.7 C)  TempSrc:  Oral Oral Oral  SpO2: 95% 94% 92% 94%  Weight:      Height:        Intake/Output Summary (Last 24 hours) at 10/30/2018 1239 Last data filed at 10/30/2018 1100 Gross per 24 hour  Intake 668.32 ml  Output --  Net 668.32 ml     Wt Readings from Last 3 Encounters:  10/27/18 104.3 kg  09/06/18 105 kg  12/30/17 100.9 kg   Physical Exam  General: Alert and oriented x 3, NAD  Eyes:   HEENT:  Atraumatic, normocephalic  Cardiovascular: S1 S2 clear, no murmurs, RRR. No pedal edema b/l  Respiratory: CTAB, no wheezing, rales or rhonchi  Gastrointestinal: Soft, TTP, diffusely worse in the right lower quadrant and lower abdomen  Ext: no pedal edema bilaterally  Neuro: no new deficits  Musculoskeletal: No cyanosis, clubbing  Skin: No rashes  Psych: Normal affect and demeanor, alert and oriented x3    Data Reviewed:  I have personally reviewed following labs and imaging studies  Micro Results Recent Results (from the past 240 hour(s))  SARS Coronavirus 2 (CEPHEID - Performed in [place] hospital lab), Hosp Order     Status: None   Collection Time: 10/27/18  7:30 PM   Specimen: Nasopharyngeal Swab  Result Value Ref Range Status   SARS Coronavirus 2 NEGATIVE NEGATIVE Final    Comment: (NOTE) If result is NEGATIVE SARS-CoV-2 target nucleic acids are NOT DETECTED. The SARS-CoV-2 RNA is generally detectable in upper and lower  respiratory specimens during the acute phase of infection. The lowest  concentration of SARS-CoV-2 viral copies this assay can detect is 250  copies / mL. A negative result does not preclude SARS-CoV-2 infection  and  should not be used as the sole basis for treatment or other  patient management decisions.  A negative result may occur with  improper specimen collection / handling, submission of specimen other  than nasopharyngeal swab, presence of viral mutation(s) within the  areas targeted by this assay, and inadequate number of viral copies  (<250 copies / mL). A negative result must be combined with clinical  observations, patient history, and epidemiological information. If result is POSITIVE SARS-CoV-2 target nucleic acids are DETECTED. The SARS-CoV-2 RNA is generally detectable in upper and lower  respiratory specimens dur ing the acute phase of infection.  Positive  results are indicative of active infection with SARS-CoV-2.  Clinical  correlation with patient history and other diagnostic information is  necessary to determine patient infection status.  Positive results do  not rule out bacterial infection or co-infection with other viruses. If result is PRESUMPTIVE POSTIVE SARS-CoV-2 nucleic acids MAY BE PRESENT.   A presumptive positive result was obtained on the submitted specimen  and confirmed on repeat testing.  While 2019 novel coronavirus  (SARS-CoV-2) nucleic acids may be present in the submitted sample  additional confirmatory testing may be necessary for epidemiological  and / or clinical management purposes  to differentiate between  SARS-CoV-2 and other Sarbecovirus currently known to infect humans.  If clinically indicated additional testing with an alternate test  methodology [phone]) is advised. The SARS-CoV-2 RNA is generally  detectable in upper and lower respiratory sp ecimens during the acute  phase of infection. The expected result is Negative. Fact Sheet for Patients:  StrictlyIdeas.no Fact Sheet for Healthcare Providers: BankingDealers.co.za This test is not yet approved or cleared by the [place] FDA and has been  authorized for detection and/or diagnosis of SARS-CoV-2 by FDA under an Emergency Use Authorization (EUA).  This EUA will remain in effect (meaning this test can be used) for the duration of the COVID-19 declaration under Section 564(b)(1) of the Act, 21 U.S.C. section 360bbb-3(b)(1), unless the authorization is terminated or revoked sooner. Performed at [facility], [place] [address], [place], [place] [address]     Radiology Reports Ct Abdomen Pelvis W Contrast  Result Date: 10/27/2018 CLINICAL DATA:  Severe lower abdominal pain, nausea, vomiting EXAM: CT ABDOMEN AND PELVIS WITH CONTRAST TECHNIQUE: Multidetector CT imaging of the abdomen and pelvis was performed using the standard protocol following bolus administration of intravenous contrast. CONTRAST:  141mL OMNIPAQUE IOHEXOL 300 MG/ML  SOLN COMPARISON:  04/21/2016 FINDINGS: Lower chest: No acute abnormality. Hepatobiliary: No focal liver abnormality is seen. Status post cholecystectomy. No biliary dilatation. Pancreas: Unremarkable. No pancreatic ductal dilatation or surrounding inflammatory changes. Spleen: Normal in size without significant abnormality. Adrenals/Urinary Tract: Adrenal glands are unremarkable. Kidneys are normal, without renal calculi, solid lesion, or hydronephrosis. Bladder is unremarkable. Stomach/Bowel: Stomach is within normal limits. Appendix appears normal. There is extensive fatty mural stratification of the ileum and the majority of the colon, with a very tethered appearance of the distal small bowel loops in the right lower quadrant (series 2, image 67). There is sigmoid diverticulosis with extensive thickening and fat stranding about the mid sigmoid. There is a tiny focus of extraluminal air anterior to the mid sigmoid (series 2, image 76). There are additional foci of air in the left upper quadrant mesentery (series 2, image 22, 29). No evidence of abscess. Vascular/Lymphatic: No significant vascular  findings are present. No enlarged abdominal or pelvic lymph nodes. Reproductive: IUD is present in the endometrial cavity. Fluid attenuation lesions of the bilateral ovaries consistent with functional cysts or follicles. Other: No abdominal wall hernia or abnormality. Trace ascites in the low abdomen and pelvis. Musculoskeletal: No acute or significant osseous findings. IMPRESSION: 1. There is sigmoid diverticulosis with extensive thickening and fat stranding about the mid sigmoid. There is a tiny focus of extraluminal air anterior to the mid sigmoid (series 2, image 76). Findings are consistent with acute diverticulitis complicated by perforation. There are additional foci of air in the left upper quadrant mesentery (series 2, image 22, 29), which is an  unusual location for abdominal air however there is no overt pneumoperitoneum about the liver or diaphragms. No evidence of abscess. 2. There is extensive fatty mural stratification of the ileum and the majority of the colon, with a very tethered appearance of the distal small bowel loops in the right lower quadrant (series 2, image 67). There is sigmoid diverticulosis with extensive thickening and fat stranding about the mid sigmoid. Findings are generally consistent with inflammatory bowel disease, particularly Crohn's disease, without particular findings to suggest acute inflammation. 3. Trace ascites in the low abdomen and pelvis. Electronically Signed   By: Eddie Candle M.D.   On: 10/27/2018 19:05    Lab Data:  CBC: Recent Labs  Lab 10/27/18 1223 10/28/18 0312 10/29/18 0253 10/30/18 0306  WBC 15.1* 20.4* 20.4* 17.4*  NEUTROABS  --  16.6*  --   --   HGB 13.1 13.0 12.4 11.9*  HCT 42.7 42.3 39.4 38.4  MCV 88.6 90.2 89.3 89.1  PLT 444* 363 294 334   Basic Metabolic Panel: Recent Labs  Lab 10/27/18 1223 10/28/18 0312 10/29/18 0253 10/30/18 0306  NA 136 137 137 135  K 3.5 3.2* 3.0* 3.0*  CL 98 101 99 96*  CO2 25 24 24 27   GLUCOSE 120*  112* 114* 87  BUN 15 15 12 11   CREATININE 1.07* 0.93 0.82 0.80  CALCIUM 8.9 8.1* 7.5* 7.7*   GFR: Estimated Creatinine Clearance: 108.4 mL/min (by C-G formula based on SCr of 0.8 mg/dL). Liver Function Tests: Recent Labs  Lab 10/27/18 1223 10/28/18 0312  AST 24 23  ALT 23 20  ALKPHOS 94 81  BILITOT 1.2 1.4*  PROT 7.9 7.3  ALBUMIN 4.0 3.4*   Recent Labs  Lab 10/27/18 1223  LIPASE 25   No results for input(s): AMMONIA in the last 168 hours. Coagulation Profile: No results for input(s): INR, PROTIME in the last 168 hours. Cardiac Enzymes: No results for input(s): CKTOTAL, CKMB, CKMBINDEX, TROPONINI in the last 168 hours. BNP (last 3 results) No results for input(s): PROBNP in the last 8760 hours. HbA1C: No results for input(s): HGBA1C in the last 72 hours. CBG: No results for input(s): GLUCAP in the last 168 hours. Lipid Profile: No results for input(s): CHOL, HDL, LDLCALC, TRIG, CHOLHDL, LDLDIRECT in the last 72 hours. Thyroid Function Tests: No results for input(s): TSH, T4TOTAL, FREET4, T3FREE, THYROIDAB in the last 72 hours. Anemia Panel: No results for input(s): VITAMINB12, FOLATE, FERRITIN, TIBC, IRON, RETICCTPCT in the last 72 hours. Urine analysis:    Component Value Date/Time   COLORURINE AMBER (A) 10/27/2018 1929   APPEARANCEUR CLEAR 10/27/2018 1929   LABSPEC >1.046 (H) 10/27/2018 1929   PHURINE 5.0 10/27/2018 1929   GLUCOSEU NEGATIVE 10/27/2018 1929   HGBUR NEGATIVE 10/27/2018 1929   BILIRUBINUR NEGATIVE 10/27/2018 1929   KETONESUR 20 (A) 10/27/2018 1929   PROTEINUR NEGATIVE 10/27/2018 1929   UROBILINOGEN 0.2 12/12/2013 2356   NITRITE NEGATIVE 10/27/2018 1929   LEUKOCYTESUR NEGATIVE 10/27/2018 1929     Janayla Marik M.D. [facility] 10/30/2018, 12:39 PM  Pager: [phone] Between 7am to 7pm - call Pager - [phone]  After 7pm go to [email or web address] - password [email or web address]  Call night coverage person covering after 7pm

## 2018-10-30 NOTE — Progress Notes (Signed)
Assessment & Plan: HD#3 - sigmoid diverticulitis with microperforation             IV Zosyn             Clear liquid diet             WBC slightly improved this AM at 17K, afebrile             Encouraged OOB, ambulation             Likely repeat CT scan early next week        Armandina Gemma, MD       [facility]       Office: [phone]   Chief Complaint: Diverticulitis with microperforation  Subjective: Patient in bed, comfortable.  Clear liquid diet.  Objective: Vital signs in last 24 hours: Temp:  [98 F (36.7 C)-98.7 F (37.1 C)] 98 F (36.7 C) (08/01 0413) Pulse Rate:  [93-100] 93 (08/01 0413) Resp:  [17-20] 20 (08/01 0413) BP: (116-128)/(71-77) 128/72 (08/01 0413) SpO2:  [92 %-94 %] 94 % (08/01 0413) Last BM Date: 10/28/18  Intake/Output from previous day: 07/31 0701 - 08/01 0700 In: 488.3 [P.O.:210; I.V.:123.6; IV Piggyback:154.7] Out: -  Intake/Output this shift: No intake/output data recorded.  Physical Exam: HEENT - sclerae clear, mucous membranes moist Abdomen - soft without distension; diffusely tender, max LLQ with voluntary guarding; no mass Ext - no edema, non-tender Neuro - alert & oriented, no focal deficits  Lab Results:  Recent Labs    10/29/18 0253 10/30/18 0306  WBC 20.4* 17.4*  HGB 12.4 11.9*  HCT 39.4 38.4  PLT 294 335   BMET Recent Labs    10/29/18 0253 10/30/18 0306  NA 137 135  K 3.0* 3.0*  CL 99 96*  CO2 24 27  GLUCOSE 114* 87  BUN 12 11  CREATININE 0.82 0.80  CALCIUM 7.5* 7.7*   PT/INR No results for input(s): LABPROT, INR in the last 72 hours. Comprehensive Metabolic Panel:    Component Value Date/Time   NA 135 10/30/2018 0306   NA 137 10/29/2018 0253   NA 141 02/05/2016 1533   K 3.0 (L) 10/30/2018 0306   K 3.0 (L) 10/29/2018 0253   CL 96 (L) 10/30/2018 0306   CL 99 10/29/2018 0253   CO2 27 10/30/2018 0306   CO2 24 10/29/2018 0253   BUN 11 10/30/2018 0306   BUN 12 10/29/2018 0253    BUN 9 02/05/2016 1533   CREATININE 0.80 10/30/2018 0306   CREATININE 0.82 10/29/2018 0253   GLUCOSE 87 10/30/2018 0306   GLUCOSE 114 (H) 10/29/2018 0253   CALCIUM 7.7 (L) 10/30/2018 0306   CALCIUM 7.5 (L) 10/29/2018 0253   AST 23 10/28/2018 0312   AST 24 10/27/2018 1223   ALT 20 10/28/2018 0312   ALT 23 10/27/2018 1223   ALKPHOS 81 10/28/2018 0312   ALKPHOS 94 10/27/2018 1223   BILITOT 1.4 (H) 10/28/2018 0312   BILITOT 1.2 10/27/2018 1223   BILITOT 0.7 12/30/2017 0905   BILITOT 0.7 06/29/2017 0845   PROT 7.3 10/28/2018 0312   PROT 7.9 10/27/2018 1223   PROT 6.6 12/30/2017 0905   PROT 6.9 06/29/2017 0845   ALBUMIN 3.4 (L) 10/28/2018 0312   ALBUMIN 4.0 10/27/2018 1223   ALBUMIN 4.2 12/30/2017 0905   ALBUMIN 4.2 06/29/2017 0845    Studies/Results: No results found.    Armandina Gemma 10/30/2018  Patient ID: Sandra Bonilla, female  DOB: 01/04/1976, 43 y.o.   MRN: [id number]

## 2018-10-30 NOTE — Plan of Care (Signed)

## 2018-10-31 LAB — BASIC METABOLIC PANEL
Anion gap: 15 (ref 5–15)
BUN: 12 mg/dL (ref 6–20)
CO2: 25 mmol/L (ref 22–32)
Calcium: 7.6 mg/dL — ABNORMAL LOW (ref 8.9–10.3)
Chloride: 97 mmol/L — ABNORMAL LOW (ref 98–111)
Creatinine, Ser: 0.72 mg/dL (ref 0.44–1.00)
GFR calc Af Amer: >60 mL/min (ref >60)
GFR calc non Af Amer: >60 mL/min (ref >60)
Glucose, Bld: 73 mg/dL (ref 70–99)
Potassium: 2.7 mmol/L — CL (ref 3.5–5.1)
Sodium: 137 mmol/L (ref 135–145)

## 2018-10-31 LAB — POTASSIUM: Potassium: 3.1 mmol/L — ABNORMAL LOW (ref 3.5–5.1)

## 2018-10-31 LAB — CBC
HCT: 40.4 % (ref 36.0–46.0)
Hemoglobin: 12.5 g/dL (ref 12.0–15.0)
MCH: 27.1 pg (ref 26.0–34.0)
MCHC: 30.9 g/dL (ref 30.0–36.0)
MCV: 87.6 fL (ref 80.0–100.0)
Platelets: 401 10*3/uL — ABNORMAL HIGH (ref 150–400)
RBC: 4.61 MIL/uL (ref 3.87–5.11)
RDW: 15.3 % (ref 11.5–15.5)
WBC: 14.2 10*3/uL — ABNORMAL HIGH (ref 4.0–10.5)
nRBC: 0 % (ref 0.0–0.2)

## 2018-10-31 LAB — MAGNESIUM: Magnesium: 2.4 mg/dL (ref 1.7–2.4)

## 2018-10-31 MED ORDER — SACCHAROMYCES BOULARDII 250 MG PO CAPS
250.0000 mg | ORAL_CAPSULE | Freq: Two times a day (BID) | ORAL | Status: DC
  Administered 2018-10-31 – 2018-11-05 (×11): 250 mg via ORAL
  Filled 2018-10-31 (×11): qty 1

## 2018-10-31 MED ORDER — KCL IN DEXTROSE-NACL 20-5-0.45 MEQ/L-%-% IV SOLN
INTRAVENOUS | Status: DC
  Administered 2018-10-31 – 2018-11-03 (×5): via INTRAVENOUS
  Filled 2018-10-31 (×5): qty 1000

## 2018-10-31 MED ORDER — POTASSIUM CHLORIDE CRYS ER 20 MEQ PO TBCR: 40.0000 meq | EXTENDED_RELEASE_TABLET | Freq: Two times a day (BID) | ORAL | Status: DC

## 2018-10-31 MED ORDER — LOPERAMIDE HCL 2 MG PO CAPS
2.0000 mg | ORAL_CAPSULE | Freq: Once | ORAL | Status: AC
  Administered 2018-10-31: 2 mg via ORAL
  Filled 2018-10-31: qty 1

## 2018-10-31 MED ORDER — POTASSIUM CHLORIDE CRYS ER 20 MEQ PO TBCR
40.0000 meq | EXTENDED_RELEASE_TABLET | ORAL | Status: AC
  Administered 2018-10-31 (×3): 40 meq via ORAL
  Filled 2018-10-31 (×3): qty 2

## 2018-10-31 NOTE — Progress Notes (Signed)
[facility]                                                                              Patient Demographics  Sandra Bonilla, is a 43 y.o. female, DOB - Dec 22, 1975, JTT:[id number]  Admit date - 10/27/2018   Admitting Physician Vianne Bulls, MD  Outpatient Primary MD for the patient is [name], MD  Outpatient specialists:   LOS - 4  days   Medical records reviewed and are as summarized below:    Chief Complaint  Patient presents with  . Abdominal Pain       Brief summary   Patient is a 43 year old female with history of relapsing remitting multiple sclerosis, hypertension, depression with anxiety, and history of diverticulitis with microperforation in January 2018, presented to ED with abdominal pain.  Patient reported pain in the mid to lower abdomen, constant, with mild nausea but no fever chills diarrhea or vomiting. In ED, patient was slightly tachypneic, tachycardia, normal LFTs, leukocytosis 15.1. CT abdomen and pelvis showed acute sigmoid diverticulitis with microperforation.  Patient was placed on IV fluids, cefepime and Flagyl IV.  General surgery was consulted.   Assessment & Plan    Principal Problem: Acute sigmoid  diverticulitis with microperforation -Overnight had diarrhea, Imodium x1  -No fevers or chills, no vomiting but still has abdominal pain although improving.   -Continue clear liquid diets, leukocytosis improving.   -Continue IV Zosyn, general surgery following.  Repeat CT abdomen tomorrow    Active Problems:   Depression with anxiety -Continue Lexapro, Lamictal    Relapsing remitting multiple sclerosis (HCC) -Currently stable, hold teriflunomide in light of acute infection  Essential hypertension BP stable, hold off on antihypertensives  ?  Inflammatory bowel disease CT abdomen pelvis finding suggested possible IBD, patient had prior flexible sigmoidoscopy in 07/2016 which was negative for IBD.  She will likely  need a GI outpatient follow-up and possible colonoscopy.    Code Status: Full CODE STATUS DVT Prophylaxis:SCD's Family Communication: Discussed in detail with the patient, all imaging results, lab results explained to the patient    Disposition Plan: Continue inpatient status, will need repeat CT scan to assess improvement   Time Spent in minutes 25 minutes  Procedures:  None  Consultants:   General surgery  Antimicrobials:   Anti-infectives (From admission, onward)   Start     Dose/Rate Route Frequency Ordered Stop   10/28/18 0400  piperacillin-tazobactam (ZOSYN) IVPB 3.375 g     3.375 g 12.5 mL/hr over 240 Minutes Intravenous Every 8 hours 10/27/18 2122     10/28/18 0315  ceFEPIme (MAXIPIME) 2 g in sodium chloride 0.9 % 100 mL IVPB  Status:  Discontinued     2 g 200 mL/hr over 30 Minutes Intravenous Every 8 hours 10/27/18 1932 10/27/18 2056   10/28/18 0315  metroNIDAZOLE (FLAGYL) IVPB 500 mg  Status:  Discontinued     500 mg 100 mL/hr over 60 Minutes Intravenous Every 8 hours 10/27/18 1932 10/27/18 2056   10/27/18 1915  ceFEPIme (MAXIPIME) 2 g in sodium chloride 0.9 % 100 mL IVPB     2 g 200 mL/hr  over 30 Minutes Intravenous  Once 10/27/18 1912 10/27/18 2030   10/27/18 1915  metroNIDAZOLE (FLAGYL) IVPB 500 mg  Status:  Discontinued     500 mg 100 mL/hr over 60 Minutes Intravenous  Once 10/27/18 1912 10/27/18 2056         Medications  Scheduled Meds: . escitalopram  20 mg Oral Daily  . lamoTRIgine  400 mg Oral Daily  . oxybutynin  5 mg Oral BID  . potassium chloride  40 mEq Oral Q4H  . saccharomyces boulardii  250 mg Oral BID   Continuous Infusions: . dextrose 5 % and 0.45 % NaCl with KCl 20 mEq/L 75 mL/hr at 10/31/18 0751  . piperacillin-tazobactam (ZOSYN)  IV 3.375 g (10/31/18 1237)   PRN Meds:.acetaminophen **OR** acetaminophen, morphine injection, ondansetron (ZOFRAN) IV      Subjective:   Sandra Bonilla was seen and examined today.  Still having  some abdominal pain, diarrhea x3 overnight.  Low-grade temp of 99.2 F.  Patient denies dizziness, chest pain, shortness of breath, generalized weakness, numbess, tingling.   Objective:   Vitals:   10/30/18 0413 10/30/18 1343 10/30/18 2214 10/31/18 0453  BP: 128/72 117/69 109/71 118/69  Pulse: 93 92 89 86  Resp: 20 15 18 19   Temp: 98 F (36.7 C) 99.2 F (37.3 C) 98.6 F (37 C) 98.4 F (36.9 C)  TempSrc: Oral  Oral   SpO2: 94% 95% 91% 94%  Weight:      Height:        Intake/Output Summary (Last 24 hours) at 10/31/2018 1308 Last data filed at 10/31/2018 0800 Gross per 24 hour  Intake 2261.08 ml  Output -  Net 2261.08 ml     Wt Readings from Last 3 Encounters:  10/27/18 104.3 kg  09/06/18 105 kg  12/30/17 100.9 kg   Physical Exam  General: Alert and oriented x 3, NAD  Eyes:  HEENT:  Atraumatic, normocephalic  Cardiovascular: S1 S2 clear, RRR. No pedal edema b/l  Respiratory: CTAB, no wheezing, rales or rhonchi  Gastrointestinal: Soft, mild diffuse TTP, voluntary guarding  nondistended, NBS  Ext: no pedal edema bilaterally  Neuro: no new deficits  Musculoskeletal: No cyanosis, clubbing  Skin: No rashes  Psych: Normal affect and demeanor, alert and oriented x3    Data Reviewed:  I have personally reviewed following labs and imaging studies  Micro Results Recent Results (from the past 240 hour(s))  SARS Coronavirus 2 (CEPHEID - Performed in [facility] hospital lab), Hosp Order     Status: None   Collection Time: 10/27/18  7:30 PM   Specimen: Nasopharyngeal Swab  Result Value Ref Range Status   SARS Coronavirus 2 NEGATIVE NEGATIVE Final    Comment: (NOTE) If result is NEGATIVE SARS-CoV-2 target nucleic acids are NOT DETECTED. The SARS-CoV-2 RNA is generally detectable in upper and lower  respiratory specimens during the acute phase of infection. The lowest  concentration of SARS-CoV-2 viral copies this assay can detect is 250  copies / mL. A negative  result does not preclude SARS-CoV-2 infection  and should not be used as the sole basis for treatment or other  patient management decisions.  A negative result may occur with  improper specimen collection / handling, submission of specimen other  than nasopharyngeal swab, presence of viral mutation(s) within the  areas targeted by this assay, and inadequate number of viral copies  (<250 copies / mL). A negative result must be combined with clinical  observations, patient history, and epidemiological information.  If result is POSITIVE SARS-CoV-2 target nucleic acids are DETECTED. The SARS-CoV-2 RNA is generally detectable in upper and lower  respiratory specimens dur ing the acute phase of infection.  Positive  results are indicative of active infection with SARS-CoV-2.  Clinical  correlation with patient history and other diagnostic information is  necessary to determine patient infection status.  Positive results do  not rule out bacterial infection or co-infection with other viruses. If result is PRESUMPTIVE POSTIVE SARS-CoV-2 nucleic acids MAY BE PRESENT.   A presumptive positive result was obtained on the submitted specimen  and confirmed on repeat testing.  While 2019 novel coronavirus  (SARS-CoV-2) nucleic acids may be present in the submitted sample  additional confirmatory testing may be necessary for epidemiological  and / or clinical management purposes  to differentiate between  SARS-CoV-2 and other Sarbecovirus currently known to infect humans.  If clinically indicated additional testing with an alternate test  methodology [phone]) is advised. The SARS-CoV-2 RNA is generally  detectable in upper and lower respiratory sp ecimens during the acute  phase of infection. The expected result is Negative. Fact Sheet for Patients:  StrictlyIdeas.no Fact Sheet for Healthcare Providers: BankingDealers.co.za This test is not yet  approved or cleared by the [place] FDA and has been authorized for detection and/or diagnosis of SARS-CoV-2 by FDA under an Emergency Use Authorization (EUA).  This EUA will remain in effect (meaning this test can be used) for the duration of the COVID-19 declaration under Section 564(b)(1) of the Act, 21 U.S.C. section 360bbb-3(b)(1), unless the authorization is terminated or revoked sooner. Performed at [facility], [place] [address] [place], [place] [address]     Radiology Reports Ct Abdomen Pelvis W Contrast  Result Date: 10/27/2018 CLINICAL DATA:  Severe lower abdominal pain, nausea, vomiting EXAM: CT ABDOMEN AND PELVIS WITH CONTRAST TECHNIQUE: Multidetector CT imaging of the abdomen and pelvis was performed using the standard protocol following bolus administration of intravenous contrast. CONTRAST:  165mL OMNIPAQUE IOHEXOL 300 MG/ML  SOLN COMPARISON:  04/21/2016 FINDINGS: Lower chest: No acute abnormality. Hepatobiliary: No focal liver abnormality is seen. Status post cholecystectomy. No biliary dilatation. Pancreas: Unremarkable. No pancreatic ductal dilatation or surrounding inflammatory changes. Spleen: Normal in size without significant abnormality. Adrenals/Urinary Tract: Adrenal glands are unremarkable. Kidneys are normal, without renal calculi, solid lesion, or hydronephrosis. Bladder is unremarkable. Stomach/Bowel: Stomach is within normal limits. Appendix appears normal. There is extensive fatty mural stratification of the ileum and the majority of the colon, with a very tethered appearance of the distal small bowel loops in the right lower quadrant (series 2, image 67). There is sigmoid diverticulosis with extensive thickening and fat stranding about the mid sigmoid. There is a tiny focus of extraluminal air anterior to the mid sigmoid (series 2, image 76). There are additional foci of air in the left upper quadrant mesentery (series 2, image 22, 29). No  evidence of abscess. Vascular/Lymphatic: No significant vascular findings are present. No enlarged abdominal or pelvic lymph nodes. Reproductive: IUD is present in the endometrial cavity. Fluid attenuation lesions of the bilateral ovaries consistent with functional cysts or follicles. Other: No abdominal wall hernia or abnormality. Trace ascites in the low abdomen and pelvis. Musculoskeletal: No acute or significant osseous findings. IMPRESSION: 1. There is sigmoid diverticulosis with extensive thickening and fat stranding about the mid sigmoid. There is a tiny focus of extraluminal air anterior to the mid sigmoid (series 2, image 76). Findings are consistent with acute diverticulitis complicated by  perforation. There are additional foci of air in the left upper quadrant mesentery (series 2, image 22, 29), which is an unusual location for abdominal air however there is no overt pneumoperitoneum about the liver or diaphragms. No evidence of abscess. 2. There is extensive fatty mural stratification of the ileum and the majority of the colon, with a very tethered appearance of the distal small bowel loops in the right lower quadrant (series 2, image 67). There is sigmoid diverticulosis with extensive thickening and fat stranding about the mid sigmoid. Findings are generally consistent with inflammatory bowel disease, particularly Crohn's disease, without particular findings to suggest acute inflammation. 3. Trace ascites in the low abdomen and pelvis. Electronically Signed   By: Eddie Candle M.D.   On: 10/27/2018 19:05    Lab Data:  CBC: Recent Labs  Lab 10/27/18 1223 10/28/18 0312 10/29/18 0253 10/30/18 0306 10/31/18 0408  WBC 15.1* 20.4* 20.4* 17.4* 14.2*  NEUTROABS  --  16.6*  --   --   --   HGB 13.1 13.0 12.4 11.9* 12.5  HCT 42.7 42.3 39.4 38.4 40.4  MCV 88.6 90.2 89.3 89.1 87.6  PLT 444* 363 294 335 841*   Basic Metabolic Panel: Recent Labs  Lab 10/27/18 1223 10/28/18 0312 10/29/18 0253  10/30/18 0306 10/31/18 0408  NA 136 137 137 135 137  K 3.5 3.2* 3.0* 3.0* 2.7*  CL 98 101 99 96* 97*  CO2 25 24 24 27 25   GLUCOSE 120* 112* 114* 87 73  BUN 15 15 12 11 12   CREATININE 1.07* 0.93 0.82 0.80 0.72  CALCIUM 8.9 8.1* 7.5* 7.7* 7.6*  MG  --   --   --   --  2.4   GFR: Estimated Creatinine Clearance: 108.4 mL/min (by C-G formula based on SCr of 0.72 mg/dL). Liver Function Tests: Recent Labs  Lab 10/27/18 1223 10/28/18 0312  AST 24 23  ALT 23 20  ALKPHOS 94 81  BILITOT 1.2 1.4*  PROT 7.9 7.3  ALBUMIN 4.0 3.4*   Recent Labs  Lab 10/27/18 1223  LIPASE 25   No results for input(s): AMMONIA in the last 168 hours. Coagulation Profile: No results for input(s): INR, PROTIME in the last 168 hours. Cardiac Enzymes: No results for input(s): CKTOTAL, CKMB, CKMBINDEX, TROPONINI in the last 168 hours. BNP (last 3 results) No results for input(s): PROBNP in the last 8760 hours. HbA1C: No results for input(s): HGBA1C in the last 72 hours. CBG: No results for input(s): GLUCAP in the last 168 hours. Lipid Profile: No results for input(s): CHOL, HDL, LDLCALC, TRIG, CHOLHDL, LDLDIRECT in the last 72 hours. Thyroid Function Tests: No results for input(s): TSH, T4TOTAL, FREET4, T3FREE, THYROIDAB in the last 72 hours. Anemia Panel: No results for input(s): VITAMINB12, FOLATE, FERRITIN, TIBC, IRON, RETICCTPCT in the last 72 hours. Urine analysis:    Component Value Date/Time   COLORURINE AMBER (A) 10/27/2018 1929   APPEARANCEUR CLEAR 10/27/2018 1929   LABSPEC >1.046 (H) 10/27/2018 1929   PHURINE 5.0 10/27/2018 1929   GLUCOSEU NEGATIVE 10/27/2018 1929   HGBUR NEGATIVE 10/27/2018 1929   BILIRUBINUR NEGATIVE 10/27/2018 1929   KETONESUR 20 (A) 10/27/2018 1929   PROTEINUR NEGATIVE 10/27/2018 1929   UROBILINOGEN 0.2 12/12/2013 2356   NITRITE NEGATIVE 10/27/2018 1929   LEUKOCYTESUR NEGATIVE 10/27/2018 1929     Sandra Bonilla M.D. [facility] 10/31/2018, 1:08 PM   Pager: [phone] Between 7am to 7pm - call Pager - [phone]  After 7pm go to [email or web address] - password  [email or web address]  Call night coverage person covering after 7pm

## 2018-10-31 NOTE — Progress Notes (Signed)
     Assessment & Plan:  HD#4 - sigmoid diverticulitis with microperforation IV Zosyn Clear liquid diet - would continue today WBC improved this AM at 14K, afebrile Encouraged OOB, ambulation May need repeat CT scan early next week        Armandina Gemma, MD       [facility]       Office: [phone]   Chief Complaint: Diverticulitis with microperforation  Subjective: Patient in bed, sleeping, arouses easily.  "Better" today.  Some pain.  Objective: Vital signs in last 24 hours: Temp:  [98.4 F (36.9 C)-99.2 F (37.3 C)] 98.4 F (36.9 C) (08/02 0453) Pulse Rate:  [86-92] 86 (08/02 0453) Resp:  [15-19] 19 (08/02 0453) BP: (109-118)/(69-71) 118/69 (08/02 0453) SpO2:  [91 %-95 %] 94 % (08/02 0453) Last BM Date: 10/30/18  Intake/Output from previous day: 08/01 0701 - 08/02 0700 In: 2261.1 [P.O.:830; I.V.:1350; IV Piggyback:81.1] Out: -  Intake/Output this shift: No intake/output data recorded.  Physical Exam: HEENT - sclerae clear, mucous membranes moist Neck - soft Abdomen - soft, non-distended; mild diffuse tenderness with voluntary guarding; no mass Ext - no edema, non-tender Neuro - alert & oriented, no focal deficits  Lab Results:  Recent Labs    10/30/18 0306 10/31/18 0408  WBC 17.4* 14.2*  HGB 11.9* 12.5  HCT 38.4 40.4  PLT 335 401*   BMET Recent Labs    10/30/18 0306 10/31/18 0408  NA 135 137  K 3.0* 2.7*  CL 96* 97*  CO2 27 25  GLUCOSE 87 73  BUN 11 12  CREATININE 0.80 0.72  CALCIUM 7.7* 7.6*   PT/INR No results for input(s): LABPROT, INR in the last 72 hours. Comprehensive Metabolic Panel:    Component Value Date/Time   NA 137 10/31/2018 0408   NA 135 10/30/2018 0306   NA 141 02/05/2016 1533   K 2.7 (LL) 10/31/2018 0408   K 3.0 (L) 10/30/2018 0306   CL 97 (L) 10/31/2018 0408   CL 96 (L) 10/30/2018 0306   CO2 25 10/31/2018 0408   CO2 27 10/30/2018  0306   BUN 12 10/31/2018 0408   BUN 11 10/30/2018 0306   BUN 9 02/05/2016 1533   CREATININE 0.72 10/31/2018 0408   CREATININE 0.80 10/30/2018 0306   GLUCOSE 73 10/31/2018 0408   GLUCOSE 87 10/30/2018 0306   CALCIUM 7.6 (L) 10/31/2018 0408   CALCIUM 7.7 (L) 10/30/2018 0306   AST 23 10/28/2018 0312   AST 24 10/27/2018 1223   ALT 20 10/28/2018 0312   ALT 23 10/27/2018 1223   ALKPHOS 81 10/28/2018 0312   ALKPHOS 94 10/27/2018 1223   BILITOT 1.4 (H) 10/28/2018 0312   BILITOT 1.2 10/27/2018 1223   BILITOT 0.7 12/30/2017 0905   BILITOT 0.7 06/29/2017 0845   PROT 7.3 10/28/2018 0312   PROT 7.9 10/27/2018 1223   PROT 6.6 12/30/2017 0905   PROT 6.9 06/29/2017 0845   ALBUMIN 3.4 (L) 10/28/2018 0312   ALBUMIN 4.0 10/27/2018 1223   ALBUMIN 4.2 12/30/2017 0905   ALBUMIN 4.2 06/29/2017 0845    Studies/Results: No results found.    Armandina Gemma 10/31/2018  Patient ID: Sandra Bonilla, female   DOB: 05-16-1975, 43 y.o.   MRN: [id number]

## 2018-11-01 ENCOUNTER — Inpatient Hospital Stay (HOSPITAL_COMMUNITY): Payer: BC Managed Care – PPO

## 2018-11-01 LAB — CBC
HCT: 33.8 % — ABNORMAL LOW (ref 36.0–46.0)
Hemoglobin: 10.5 g/dL — ABNORMAL LOW (ref 12.0–15.0)
MCH: 27.3 pg (ref 26.0–34.0)
MCHC: 31.1 g/dL (ref 30.0–36.0)
MCV: 88 fL (ref 80.0–100.0)
Platelets: 359 10*3/uL (ref 150–400)
RBC: 3.84 MIL/uL — ABNORMAL LOW (ref 3.87–5.11)
RDW: 15.5 % (ref 11.5–15.5)
WBC: 14.8 10*3/uL — ABNORMAL HIGH (ref 4.0–10.5)
nRBC: 0 % (ref 0.0–0.2)

## 2018-11-01 LAB — MAGNESIUM: Magnesium: 2.4 mg/dL (ref 1.7–2.4)

## 2018-11-01 LAB — BASIC METABOLIC PANEL
Anion gap: 9 (ref 5–15)
BUN: 9 mg/dL (ref 6–20)
CO2: 26 mmol/L (ref 22–32)
Calcium: 7.8 mg/dL — ABNORMAL LOW (ref 8.9–10.3)
Chloride: 103 mmol/L (ref 98–111)
Creatinine, Ser: 0.71 mg/dL (ref 0.44–1.00)
GFR calc Af Amer: >60 mL/min (ref >60)
GFR calc non Af Amer: >60 mL/min (ref >60)
Glucose, Bld: 116 mg/dL — ABNORMAL HIGH (ref 70–99)
Potassium: 3.1 mmol/L — ABNORMAL LOW (ref 3.5–5.1)
Sodium: 138 mmol/L (ref 135–145)

## 2018-11-01 LAB — PROTIME-INR
INR: 1.5 — ABNORMAL HIGH (ref 0.8–1.2)
Prothrombin Time: 17.5 seconds — ABNORMAL HIGH (ref 11.4–15.2)

## 2018-11-01 MED ORDER — IOHEXOL 300 MG/ML  SOLN
30.0000 mL | Freq: Once | INTRAMUSCULAR | Status: AC | PRN
  Administered 2018-11-01: 30 mL via ORAL

## 2018-11-01 MED ORDER — POTASSIUM CHLORIDE CRYS ER 20 MEQ PO TBCR
40.0000 meq | EXTENDED_RELEASE_TABLET | Freq: Once | ORAL | Status: AC
  Administered 2018-11-01: 40 meq via ORAL
  Filled 2018-11-01: qty 2

## 2018-11-01 MED ORDER — SODIUM CHLORIDE (PF) 0.9 % IJ SOLN
INTRAMUSCULAR | Status: AC
  Filled 2018-11-01: qty 50

## 2018-11-01 MED ORDER — IOHEXOL 300 MG/ML  SOLN
100.0000 mL | Freq: Once | INTRAMUSCULAR | Status: AC | PRN
  Administered 2018-11-01: 100 mL via INTRAVENOUS

## 2018-11-01 NOTE — Consult Note (Signed)
Chief Complaint: Patient was seen in consultation today for CT-guided drainage of abdominal/pelvic abscess Chief Complaint  Patient presents with  . Abdominal Pain    Referring Physician(s): Gross,S  Supervising Physician: Daryll Brod  Patient Status: [facility] - In-pt  History of Present Illness: Sandra Bonilla is a 43 y.o. female with history of MS, hypertension, anxiety/depression who was admitted to [facility] on 7/29 with persistent abdominal pain and nausea.  Latest CT of the abdomen pelvis done today reveals worsening  sigmoid colonic diverticulitis with new extensive pericolonic abscess.  Request now received for CT-guided drainage of the abscess.  Patient is afebrile, WBC 14.8. She  is COVID-19 negative.  Past Medical History:  Diagnosis Date  . Cervical high risk human papillomavirus (HPV) DNA test positive 09/2015  . Cholecystitis 2006   GALBLADDER REMOVED  . 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 History of mammogram 11/2014   FIBROADENOMA  . History of Papanicolaou smear of cervix 01/24/13; 10/15/15   -/-; -/+  . Hypertension   . Menorrhagia 02/01/2010   D/C HYSTEROSCOPY ENDO POLYP  . MS (multiple sclerosis) ([place])   . Ovarian cyst 9/15; 2015-2016   LEFT - WENT TO [place]; RIGHT    Past Surgical History:  Procedure Laterality Date  . BREAST BIOPSY Right 2016   Fibroadenoma  . CHOLECYSTECTOMY  2006  . DILATION AND CURETTAGE, DIAGNOSTIC / THERAPEUTIC  02/01/2010   D/C HYSTEROSCOPY ENDO POLYP; [facility]  . FLEXIBLE SIGMOIDOSCOPY  07/31/2016   Procedure: FLEXIBLE SIGMOIDOSCOPY;  Surgeon: [name], MD;  Location: [facility] ENDOSCOPY;  Service: Endoscopy;;  . HYSTEROSCOPY  02/01/2010   ENDO POLYP - [facility]    Allergies: Septra [sulfamethoxazole-trimethoprim]  Medications: Prior to Admission medications   Medication Sig Start Date End Date Taking? Authorizing Provider  chlorthalidone (HYGROTON) 25 MG tablet Take 12.5 mg by mouth daily.   Yes [provider]  cholecalciferol (VITAMIN D3) 25 MCG (1000 UT) tablet Take 1,000 Units by mouth daily.   Yes [provider]  cyclobenzaprine (FLEXERIL) 5 MG tablet Take 1 tablet (5 mg total) by mouth at bedtime as needed. Patient taking differently: Take 5 mg by mouth at bedtime as needed for muscle spasms.  12/26/16  Yes Sater, Nanine Means, MD  escitalopram (LEXAPRO) 20 MG tablet Take 20 mg by mouth daily.  11/11/17  Yes [provider]  lamoTRIgine (LAMICTAL) 200 MG tablet Take 400 mg by mouth daily.    Yes [provider]  levonorgestrel (MIRENA) 20 MCG/24HR IUD 1 each by Intrauterine route once. 11/14/15  Yes [provider]  oxybutynin (DITROPAN) 5 MG tablet Take 1 tablet (5 mg total) by mouth 2 (two) times daily. 12/30/17  Yes Sater, Nanine Means, MD  Probiotic Product (PROBIOTIC PO) Take 1 capsule by mouth daily.   Yes [provider]  Teriflunomide (AUBAGIO) 14 MG TABS Take 1 tablet by mouth daily. 04/12/18  Yes Sater, Nanine Means, MD  gabapentin (NEURONTIN) 100 MG capsule Take 100 mg by mouth daily as needed (nerve pain).  05/19/18   [provider]  meloxicam (MOBIC) 15 MG tablet Take 1 tablet (15 mg total) by mouth daily. Patient taking differently: Take 15 mg by mouth daily as needed.  12/26/16   Sater, Nanine Means, MD  Polyvinyl Alcohol-Povidone (REFRESH OP) Place 2 drops into both eyes 4 (four) times daily as needed (dryness).    [provider]     Family History  Problem Relation Age of Onset  .  Breast cancer Maternal Aunt 80  . Rheum arthritis Mother   . Hypertension Mother   . Thyroid disease Mother        HYPOTHYROIDISM  . Cancer Mother 93       LUNG  . Heart disease Father   . Diabetes Father   . Hypertension Father   . Cancer Paternal Grandmother        MELANOMA OF SKIN  . Cancer Cousin        KIDNEY-COUSIN    Social History   Socioeconomic History  . Marital status: Divorced    Spouse name: Not on file  .  Number of children: 0  . Years of education: 67  . Highest education level: Not on file  Occupational History    Employer: [employer]  . Financial resource strain: Not on file  . Food insecurity    Worry: Not on file    Inability: Not on file  . Transportation needs    Medical: Not on file    Non-medical: Not on file  Tobacco Use  . Smoking status: Former Research scientist (life sciences)  . Smokeless tobacco: Never Used  Substance and Sexual Activity  . Alcohol use: No  . Drug use: No  . Sexual activity: Not Currently    Birth control/protection: I.U.D.    Comment: Mirena  Lifestyle  . Physical activity    Days per week: Not on file    Minutes per session: Not on file  . Stress: Not on file  Relationships  . Social Herbalist on phone: Not on file    Gets together: Not on file    Attends religious service: Not on file    Active member of club or organization: Not on file    Attends meetings of clubs or organizations: Not on file    Relationship status: Not on file  Other Topics Concern  . Not on file  Social History Narrative  . Not on file      Review of Systems see above; currently denies fever, headache, chest pain, cough, vomiting or bleeding.  She does have anterior lower abdominal discomfort with some radiation to her back and some mild dyspnea with exertion  Vital Signs: BP 108/65   Pulse 100   Temp 98.4 F (36.9 C) (Oral)   Resp 16   Ht 5\' 6"  (1.676 m)   Wt 230 lb (104.3 kg)   SpO2 96%   BMI 37.12 kg/m   Physical Exam awake, alert.  Chest clear to auscultation bilaterally.  Heart with regular rate and rhythm.  Abdomen soft, positive bowel sounds, tender anterior pelvic/suprapubic region to palpation; extremities with full range of motion.  Imaging: Ct Abdomen Pelvis W Contrast  Result Date: 11/01/2018 CLINICAL DATA:  Never ticket itis, known, follow-up EXAM: CT ABDOMEN AND PELVIS WITH CONTRAST TECHNIQUE: Multidetector CT imaging of the abdomen and  pelvis was performed using the standard protocol following bolus administration of intravenous contrast. CONTRAST:  187mL OMNIPAQUE IOHEXOL 300 MG/ML SOLN, 86mL OMNIPAQUE IOHEXOL 300 MG/ML SOLN COMPARISON:  October 27, 2018 FINDINGS: Lower chest: The heart is unremarkable. No hiatal hernia. The visualized portions of the lungs are clear. Hepatobiliary: The liver is normal in density without focal abnormality.The main portal vein is patent. The patient is status post cholecystectomy. No biliary ductal dilation. Pancreas: Unremarkable. No pancreatic ductal dilatation or surrounding inflammatory changes. Spleen: Normal in size without focal abnormality. Adrenals/Urinary Tract: Again noted is a 1.5 cm hypodense lesion within  the left adrenal gland. The right adrenal gland is unremarkable. The kidneys and collecting system appear normal without evidence of urinary tract calculus or hydronephrosis. Bladder is unremarkable. Stomach/Bowel: The stomach is normal in appearance. There is been interval worsening of the extensive sigmoid colonic diverticulitis with surrounding mesenteric fat stranding changes. There is new extensive loculated fluid collection which extends adjacent to the sigmoid colon and surrounds distal ileal loops within the lower right abdomen best seen on series 2, image 76 and extending inferiorly adjacent to the uterus and posterior/just deep to the right ovary, series 2, image 81. There is new bowel wall thickening involving the distal ileal loops within the right lower abdomen. Vascular/Lymphatic: There are no enlarged mesenteric, retroperitoneal, or pelvic lymph nodes. Scattered small periaortic and mesenteric lymph nodes are seen. No significant vascular findings are present. Reproductive: The uterus is unremarkable. Again noted are probable dominant follicle/ovarian cyst within both adnexa. The largest cyst 3.5 cm in the left adnexa. There is IUD seen within the uterus. As stated above there is  pericolonic abscess which extends adjacent to the right uterus and just deep to the right ovary. Other: No evidence of abdominal wall mass or hernia. Musculoskeletal: No acute or significant osseous findings. IMPRESSION: Interval worsening in the sigmoid colonic diverticulitis with new extensive pericolonic abscess as described above. This surrounds the distal ileal loops causing a ileal wall thickening, and and extends adjacent to the uterus and right ovary. These results were called by telephone at the time of interpretation on 11/01/2018 at 3:02 pm to PA. Brigid Re , who verbally acknowledged these results. Electronically Signed   By: Prudencio Pair M.D.   On: 11/01/2018 15:02   Ct Abdomen Pelvis W Contrast  Result Date: 10/27/2018 CLINICAL DATA:  Severe lower abdominal pain, nausea, vomiting EXAM: CT ABDOMEN AND PELVIS WITH CONTRAST TECHNIQUE: Multidetector CT imaging of the abdomen and pelvis was performed using the standard protocol following bolus administration of intravenous contrast. CONTRAST:  145mL OMNIPAQUE IOHEXOL 300 MG/ML  SOLN COMPARISON:  04/21/2016 FINDINGS: Lower chest: No acute abnormality. Hepatobiliary: No focal liver abnormality is seen. Status post cholecystectomy. No biliary dilatation. Pancreas: Unremarkable. No pancreatic ductal dilatation or surrounding inflammatory changes. Spleen: Normal in size without significant abnormality. Adrenals/Urinary Tract: Adrenal glands are unremarkable. Kidneys are normal, without renal calculi, solid lesion, or hydronephrosis. Bladder is unremarkable. Stomach/Bowel: Stomach is within normal limits. Appendix appears normal. There is extensive fatty mural stratification of the ileum and the majority of the colon, with a very tethered appearance of the distal small bowel loops in the right lower quadrant (series 2, image 67). There is sigmoid diverticulosis with extensive thickening and fat stranding about the mid sigmoid. There is a tiny focus of  extraluminal air anterior to the mid sigmoid (series 2, image 76). There are additional foci of air in the left upper quadrant mesentery (series 2, image 22, 29). No evidence of abscess. Vascular/Lymphatic: No significant vascular findings are present. No enlarged abdominal or pelvic lymph nodes. Reproductive: IUD is present in the endometrial cavity. Fluid attenuation lesions of the bilateral ovaries consistent with functional cysts or follicles. Other: No abdominal wall hernia or abnormality. Trace ascites in the low abdomen and pelvis. Musculoskeletal: No acute or significant osseous findings. IMPRESSION: 1. There is sigmoid diverticulosis with extensive thickening and fat stranding about the mid sigmoid. There is a tiny focus of extraluminal air anterior to the mid sigmoid (series 2, image 76). Findings are consistent with acute diverticulitis complicated by perforation.  There are additional foci of air in the left upper quadrant mesentery (series 2, image 22, 29), which is an unusual location for abdominal air however there is no overt pneumoperitoneum about the liver or diaphragms. No evidence of abscess. 2. There is extensive fatty mural stratification of the ileum and the majority of the colon, with a very tethered appearance of the distal small bowel loops in the right lower quadrant (series 2, image 67). There is sigmoid diverticulosis with extensive thickening and fat stranding about the mid sigmoid. Findings are generally consistent with inflammatory bowel disease, particularly Crohn's disease, without particular findings to suggest acute inflammation. 3. Trace ascites in the low abdomen and pelvis. Electronically Signed   By: Eddie Candle M.D.   On: 10/27/2018 19:05    Labs:  CBC: Recent Labs    10/29/18 0253 10/30/18 0306 10/31/18 0408 11/01/18 0309  WBC 20.4* 17.4* 14.2* 14.8*  HGB 12.4 11.9* 12.5 10.5*  HCT 39.4 38.4 40.4 33.8*  PLT 294 335 401* 359    COAGS: Recent Labs     11/01/18 1531  INR 1.5*    BMP: Recent Labs    10/29/18 0253 10/30/18 0306 10/31/18 0408 10/31/18 1348 11/01/18 0309  NA 137 135 137  --  138  K 3.0* 3.0* 2.7* 3.1* 3.1*  CL 99 96* 97*  --  103  CO2 24 27 25   --  26  GLUCOSE 114* 87 73  --  116*  BUN 12 11 12   --  9  CALCIUM 7.5* 7.7* 7.6*  --  7.8*  CREATININE 0.82 0.80 0.72  --  0.71  GFRNONAA >60 >60 >60  --  >60  GFRAA >60 >60 >60  --  >60    LIVER FUNCTION TESTS: Recent Labs    12/30/17 0905 10/27/18 1223 10/28/18 0312  BILITOT 0.7 1.2 1.4*  AST 15 24 23   ALT 13 23 20   ALKPHOS 101 94 81  PROT 6.6 7.9 7.3  ALBUMIN 4.2 4.0 3.4*    TUMOR MARKERS: No results for input(s): AFPTM, CEA, CA199, CHROMGRNA in the last 8760 hours.  Assessment and Plan: 43 y.o. female with history of MS, hypertension, anxiety/depression who was admitted to [facility] on 7/29 with persistent abdominal pain and nausea.  Latest CT of the abdomen pelvis done today reveals worsening  sigmoid colonic diverticulitis with new extensive pericolonic abscess.  Request now received for CT-guided drainage of the abscess.  Patient is afebrile, WBC 14.8. She  is COVID-19 negative.  Imaging studies have been reviewed by Dr. Annamaria Boots. Risks and benefits discussed with the patient including bleeding, infection, damage to adjacent structures, bowel perforation/fistula connection, and sepsis.  All of the patient's questions were answered, patient is agreeable to proceed. Consent signed and in chart.  Procedure tentatively scheduled for 8/4   Thank you for this interesting consult.  I greatly enjoyed meeting EARLEAN FIDALGO and look forward to participating in their care.  A copy of this report was sent to the requesting provider on this date.  Electronically Signed: D. Rowe Robert, PA-C 11/01/2018, 5:20 PM   I spent a total of  25 minutes   in face to face in clinical consultation, greater than 50% of which was counseling/coordinating care for  CT-guided drainage of abdominal/pelvic abscess

## 2018-11-01 NOTE — Progress Notes (Signed)
[facility] Progress Note     Subjective: CC: diverticulitis Patient still having lower abdominal pain and some nausea. Tolerating clears but having diarrhea. No blood or mucus in stool.   Objective: Vital signs in last 24 hours: Temp:  [98.1 F (36.7 C)-98.3 F (36.8 C)] 98.1 F (36.7 C) (08/03 0621) Pulse Rate:  [90-97] 92 (08/03 0621) Resp:  [15-16] 16 (08/03 0621) BP: (110-116)/(67-73) 110/67 (08/03 0621) SpO2:  [94 %-97 %] 94 % (08/03 0621) Last BM Date: 11/01/18  Intake/Output from previous day: 08/02 0701 - 08/03 0700 In: 2338.6 [P.O.:1120; I.V.:959.3; IV Piggyback:259.3] Out: -  Intake/Output this shift: Total I/O In: 120 [P.O.:120] Out: -   PE: Gen:  Alert, NAD, pleasant Card:  Regular rate and rhythm Pulm:  Normal effort, clear to auscultation bilaterally Abd: Soft, TTP across lower abdomen, non-distended, +BS Skin: warm and dry, no rashes  Psych: A&Ox3   Lab Results:  Recent Labs    10/31/18 0408 11/01/18 0309  WBC 14.2* 14.8*  HGB 12.5 10.5*  HCT 40.4 33.8*  PLT 401* 359   BMET Recent Labs    10/31/18 0408 10/31/18 1348 11/01/18 0309  NA 137  --  138  K 2.7* 3.1* 3.1*  CL 97*  --  103  CO2 25  --  26  GLUCOSE 73  --  116*  BUN 12  --  9  CREATININE 0.72  --  0.71  CALCIUM 7.6*  --  7.8*   PT/INR No results for input(s): LABPROT, INR in the last 72 hours. CMP     Component Value Date/Time   NA 138 11/01/2018 0309   NA 141 02/05/2016 1533   K 3.1 (L) 11/01/2018 0309   CL 103 11/01/2018 0309   CO2 26 11/01/2018 0309   GLUCOSE 116 (H) 11/01/2018 0309   BUN 9 11/01/2018 0309   BUN 9 02/05/2016 1533   CREATININE 0.71 11/01/2018 0309   CALCIUM 7.8 (L) 11/01/2018 0309   PROT 7.3 10/28/2018 0312   PROT 6.6 12/30/2017 0905   ALBUMIN 3.4 (L) 10/28/2018 0312   ALBUMIN 4.2 12/30/2017 0905   AST 23 10/28/2018 0312   ALT 20 10/28/2018 0312   ALKPHOS 81 10/28/2018 0312   BILITOT 1.4 (H) 10/28/2018 0312   BILITOT 0.7  12/30/2017 0905   GFRNONAA >60 11/01/2018 0309   GFRAA >60 11/01/2018 0309   Lipase     Component Value Date/Time   LIPASE 25 10/27/2018 1223       Studies/Results: No results found.  Anti-infectives: Anti-infectives (From admission, onward)   Start     Dose/Rate Route Frequency Ordered Stop   10/28/18 0400  piperacillin-tazobactam (ZOSYN) IVPB 3.375 g     3.375 g 12.5 mL/hr over 240 Minutes Intravenous Every 8 hours 10/27/18 2122     10/28/18 0315  ceFEPIme (MAXIPIME) 2 g in sodium chloride 0.9 % 100 mL IVPB  Status:  Discontinued     2 g 200 mL/hr over 30 Minutes Intravenous Every 8 hours 10/27/18 1932 10/27/18 2056   10/28/18 0315  metroNIDAZOLE (FLAGYL) IVPB 500 mg  Status:  Discontinued     500 mg 100 mL/hr over 60 Minutes Intravenous Every 8 hours 10/27/18 1932 10/27/18 2056   10/27/18 1915  ceFEPIme (MAXIPIME) 2 g in sodium chloride 0.9 % 100 mL IVPB     2 g 200 mL/hr over 30 Minutes Intravenous  Once 10/27/18 1912 10/27/18 2030   10/27/18 1915  metroNIDAZOLE (FLAGYL) IVPB 500 mg  Status:  Discontinued     500 mg 100 mL/hr over 60 Minutes Intravenous  Once 10/27/18 1912 10/27/18 2056       Assessment/Plan Relapsing Remitting Multiple Sclerosis - on immunotherapy HTN Depression/Anxiety  Sigmoid diverticulitis with microperforation  - CT 7/29: sigmoid diverticulitis with perforation, no abscess, inflammation consistent with possible inflammatory bowel disease - WBC 20 on admission, 14.8 this AM, afebrile - continue IV zosyn - some ttp across lower abdomen - continue CLD for now - repeat CT abd/pelvis today and repeat labs in the AM  FEN: CLD VTE: SCDs ID: [email or web address] 7/29>7/30; Zosyn 7/30>>    LOS: 5 days    Brigid Re , [facility] 11/01/2018, 9:56 AM Pager: [phone] Consults: [phone] 7:00 AM - 4:30 PM M, W-F 7:00 AM - 11:30 AM Tues, Sat, Sun

## 2018-11-01 NOTE — Progress Notes (Signed)
[facility]                                                                              Patient Demographics  Sandra Bonilla, is a 43 y.o. female, DOB - 03-Mar-1976, VPX:[id number]  Admit date - 10/27/2018   Admitting Physician Vianne Bulls, MD  Outpatient Primary MD for the patient is [name], MD  Outpatient specialists:   LOS - 5  days   Medical records reviewed and are as summarized below:    Chief Complaint  Patient presents with  . Abdominal Pain       Brief summary   Patient is a 43 year old female with history of relapsing remitting multiple sclerosis, hypertension, depression with anxiety, and history of diverticulitis with microperforation in January 2018, presented to ED with abdominal pain.  Patient reported pain in the mid to lower abdomen, constant, with mild nausea but no fever chills diarrhea or vomiting. In ED, patient was slightly tachypneic, tachycardia, normal LFTs, leukocytosis 15.1. CT abdomen and pelvis showed acute sigmoid diverticulitis with microperforation.  Patient was placed on IV fluids, cefepime and Flagyl IV.  General surgery was consulted.   Assessment & Plan    Principal Problem: Acute sigmoid  diverticulitis with microperforation -Continues to have diarrhea (overnight had 3 bowel movements watery, no hematochezia melena), nausea, lower abdominal pain.  States pain 7/10 -Continue clear liquid diet, IV Zosyn, -General surgery following, repeat CT abdomen today.  Active Problems: Hypokalemia Likely due to diarrhea, replaced    Depression with anxiety -Continue Lexapro, Lamictal    Relapsing remitting multiple sclerosis (HCC) -Currently stable, hold teriflunomide in light of acute infection  Essential hypertension BP stable, continue to hold off on antihypertensives  ?  Inflammatory bowel disease CT abdomen pelvis finding suggested possible IBD, patient had prior flexible sigmoidoscopy in 07/2016 which  was negative for IBD.  She will likely need a GI outpatient follow-up and possible colonoscopy.    Code Status: Full CODE STATUS DVT Prophylaxis:SCD's Family Communication: Discussed in detail with the patient, all imaging results, lab results explained to the patient    Disposition Plan: Continues to have lower abdominal pain, nausea, diarrhea, repeating CT abdomen today.  Remains inpatient.  Time Spent in minutes 25 minutes  Procedures:  None  Consultants:   General surgery  Antimicrobials:   Anti-infectives (From admission, onward)   Start     Dose/Rate Route Frequency Ordered Stop   10/28/18 0400  piperacillin-tazobactam (ZOSYN) IVPB 3.375 g     3.375 g 12.5 mL/hr over 240 Minutes Intravenous Every 8 hours 10/27/18 2122     10/28/18 0315  ceFEPIme (MAXIPIME) 2 g in sodium chloride 0.9 % 100 mL IVPB  Status:  Discontinued     2 g 200 mL/hr over 30 Minutes Intravenous Every 8 hours 10/27/18 1932 10/27/18 2056   10/28/18 0315  metroNIDAZOLE (FLAGYL) IVPB 500 mg  Status:  Discontinued     500 mg 100 mL/hr over 60 Minutes Intravenous Every 8 hours 10/27/18 1932 10/27/18 2056   10/27/18 1915  ceFEPIme (MAXIPIME) 2 g in sodium chloride 0.9 % 100 mL IVPB  2 g 200 mL/hr over 30 Minutes Intravenous  Once 10/27/18 1912 10/27/18 2030   10/27/18 1915  metroNIDAZOLE (FLAGYL) IVPB 500 mg  Status:  Discontinued     500 mg 100 mL/hr over 60 Minutes Intravenous  Once 10/27/18 1912 10/27/18 2056         Medications  Scheduled Meds: . escitalopram  20 mg Oral Daily  . lamoTRIgine  400 mg Oral Daily  . oxybutynin  5 mg Oral BID  . saccharomyces boulardii  250 mg Oral BID   Continuous Infusions: . dextrose 5 % and 0.45 % NaCl with KCl 20 mEq/L Stopped (11/01/18 0441)  . piperacillin-tazobactam (ZOSYN)  IV 12.5 mL/hr at 11/01/18 0600   PRN Meds:.acetaminophen **OR** acetaminophen, iohexol, morphine injection, ondansetron (ZOFRAN) IV      Subjective:   Sandra Bonilla was  seen and examined today.  Overnight watery diarrhea 3 bowel movements, no hematochezia or melena.  No fevers today.  Continues to have nausea and abdominal pain, lower abdomen, 7/10. Patient denies dizziness, chest pain, shortness of breath, generalized weakness, numbess, tingling.   Objective:   Vitals:   10/31/18 0453 10/31/18 1337 10/31/18 2202 11/01/18 0621  BP: 118/69 113/73 116/71 110/67  Pulse: 86 97 90 92  Resp: 19 15 16 16   Temp: 98.4 F (36.9 C) 98.3 F (36.8 C) 98.2 F (36.8 C) 98.1 F (36.7 C)  TempSrc:   Oral Oral  SpO2: 94% 97% 96% 94%  Weight:      Height:        Intake/Output Summary (Last 24 hours) at 11/01/2018 1208 Last data filed at 11/01/2018 0935 Gross per 24 hour  Intake 2060.62 ml  Output -  Net 2060.62 ml     Wt Readings from Last 3 Encounters:  10/27/18 104.3 kg  09/06/18 105 kg  12/30/17 100.9 kg    Physical Exam  General: Alert and oriented x 3, NAD  Eyes:   HEENT:  Atraumatic, normocephalic  Cardiovascular: S1 S2 clear, RRR. No pedal edema b/l  Respiratory: CTAB, no wheezing, rales or rhonchi  Gastrointestinal: Soft, TTP across lower abdomen, nondistended  Ext: no pedal edema bilaterally  Neuro: no new deficits  Musculoskeletal: No cyanosis, clubbing  Skin: No rashes  Psych: Normal affect and demeanor, alert and oriented x3   Data Reviewed:  I have personally reviewed following labs and imaging studies  Micro Results Recent Results (from the past 240 hour(s))  SARS Coronavirus 2 (CEPHEID - Performed in [place] hospital lab), Hosp Order     Status: None   Collection Time: 10/27/18  7:30 PM   Specimen: Nasopharyngeal Swab  Result Value Ref Range Status   SARS Coronavirus 2 NEGATIVE NEGATIVE Final    Comment: (NOTE) If result is NEGATIVE SARS-CoV-2 target nucleic acids are NOT DETECTED. The SARS-CoV-2 RNA is generally detectable in upper and lower  respiratory specimens during the acute phase of infection. The lowest   concentration of SARS-CoV-2 viral copies this assay can detect is 250  copies / mL. A negative result does not preclude SARS-CoV-2 infection  and should not be used as the sole basis for treatment or other  patient management decisions.  A negative result may occur with  improper specimen collection / handling, submission of specimen other  than nasopharyngeal swab, presence of viral mutation(s) within the  areas targeted by this assay, and inadequate number of viral copies  (<250 copies / mL). A negative result must be combined with clinical  observations, patient history,  and epidemiological information. If result is POSITIVE SARS-CoV-2 target nucleic acids are DETECTED. The SARS-CoV-2 RNA is generally detectable in upper and lower  respiratory specimens dur ing the acute phase of infection.  Positive  results are indicative of active infection with SARS-CoV-2.  Clinical  correlation with patient history and other diagnostic information is  necessary to determine patient infection status.  Positive results do  not rule out bacterial infection or co-infection with other viruses. If result is PRESUMPTIVE POSTIVE SARS-CoV-2 nucleic acids MAY BE PRESENT.   A presumptive positive result was obtained on the submitted specimen  and confirmed on repeat testing.  While 2019 novel coronavirus  (SARS-CoV-2) nucleic acids may be present in the submitted sample  additional confirmatory testing may be necessary for epidemiological  and / or clinical management purposes  to differentiate between  SARS-CoV-2 and other Sarbecovirus currently known to infect humans.  If clinically indicated additional testing with an alternate test  methodology [phone]) is advised. The SARS-CoV-2 RNA is generally  detectable in upper and lower respiratory sp ecimens during the acute  phase of infection. The expected result is Negative. Fact Sheet for Patients:  StrictlyIdeas.no Fact Sheet  for Healthcare Providers: BankingDealers.co.za This test is not yet approved or cleared by the [place] FDA and has been authorized for detection and/or diagnosis of SARS-CoV-2 by FDA under an Emergency Use Authorization (EUA).  This EUA will remain in effect (meaning this test can be used) for the duration of the COVID-19 declaration under Section 564(b)(1) of the Act, 21 U.S.C. section 360bbb-3(b)(1), unless the authorization is terminated or revoked sooner. Performed at [facility], [place] [address] [place], [place] [address]     Radiology Reports Ct Abdomen Pelvis W Contrast  Result Date: 10/27/2018 CLINICAL DATA:  Severe lower abdominal pain, nausea, vomiting EXAM: CT ABDOMEN AND PELVIS WITH CONTRAST TECHNIQUE: Multidetector CT imaging of the abdomen and pelvis was performed using the standard protocol following bolus administration of intravenous contrast. CONTRAST:  170mL OMNIPAQUE IOHEXOL 300 MG/ML  SOLN COMPARISON:  04/21/2016 FINDINGS: Lower chest: No acute abnormality. Hepatobiliary: No focal liver abnormality is seen. Status post cholecystectomy. No biliary dilatation. Pancreas: Unremarkable. No pancreatic ductal dilatation or surrounding inflammatory changes. Spleen: Normal in size without significant abnormality. Adrenals/Urinary Tract: Adrenal glands are unremarkable. Kidneys are normal, without renal calculi, solid lesion, or hydronephrosis. Bladder is unremarkable. Stomach/Bowel: Stomach is within normal limits. Appendix appears normal. There is extensive fatty mural stratification of the ileum and the majority of the colon, with a very tethered appearance of the distal small bowel loops in the right lower quadrant (series 2, image 67). There is sigmoid diverticulosis with extensive thickening and fat stranding about the mid sigmoid. There is a tiny focus of extraluminal air anterior to the mid sigmoid (series 2, image 76). There are  additional foci of air in the left upper quadrant mesentery (series 2, image 22, 29). No evidence of abscess. Vascular/Lymphatic: No significant vascular findings are present. No enlarged abdominal or pelvic lymph nodes. Reproductive: IUD is present in the endometrial cavity. Fluid attenuation lesions of the bilateral ovaries consistent with functional cysts or follicles. Other: No abdominal wall hernia or abnormality. Trace ascites in the low abdomen and pelvis. Musculoskeletal: No acute or significant osseous findings. IMPRESSION: 1. There is sigmoid diverticulosis with extensive thickening and fat stranding about the mid sigmoid. There is a tiny focus of extraluminal air anterior to the mid sigmoid (series 2, image 76). Findings are consistent with acute  diverticulitis complicated by perforation. There are additional foci of air in the left upper quadrant mesentery (series 2, image 22, 29), which is an unusual location for abdominal air however there is no overt pneumoperitoneum about the liver or diaphragms. No evidence of abscess. 2. There is extensive fatty mural stratification of the ileum and the majority of the colon, with a very tethered appearance of the distal small bowel loops in the right lower quadrant (series 2, image 67). There is sigmoid diverticulosis with extensive thickening and fat stranding about the mid sigmoid. Findings are generally consistent with inflammatory bowel disease, particularly Crohn's disease, without particular findings to suggest acute inflammation. 3. Trace ascites in the low abdomen and pelvis. Electronically Signed   By: Eddie Candle M.D.   On: 10/27/2018 19:05    Lab Data:  CBC: Recent Labs  Lab 10/28/18 0312 10/29/18 0253 10/30/18 0306 10/31/18 0408 11/01/18 0309  WBC 20.4* 20.4* 17.4* 14.2* 14.8*  NEUTROABS 16.6*  --   --   --   --   HGB 13.0 12.4 11.9* 12.5 10.5*  HCT 42.3 39.4 38.4 40.4 33.8*  MCV 90.2 89.3 89.1 87.6 88.0  PLT 363 294 335 401* 127    Basic Metabolic Panel: Recent Labs  Lab 10/28/18 0312 10/29/18 0253 10/30/18 0306 10/31/18 0408 10/31/18 1348 11/01/18 0309  NA 137 137 135 137  --  138  K 3.2* 3.0* 3.0* 2.7* 3.1* 3.1*  CL 101 99 96* 97*  --  103  CO2 24 24 27 25   --  26  GLUCOSE 112* 114* 87 73  --  116*  BUN 15 12 11 12   --  9  CREATININE 0.93 0.82 0.80 0.72  --  0.71  CALCIUM 8.1* 7.5* 7.7* 7.6*  --  7.8*  MG  --   --   --  2.4  --  2.4   GFR: Estimated Creatinine Clearance: 108.4 mL/min (by C-G formula based on SCr of 0.71 mg/dL). Liver Function Tests: Recent Labs  Lab 10/27/18 1223 10/28/18 0312  AST 24 23  ALT 23 20  ALKPHOS 94 81  BILITOT 1.2 1.4*  PROT 7.9 7.3  ALBUMIN 4.0 3.4*   Recent Labs  Lab 10/27/18 1223  LIPASE 25   No results for input(s): AMMONIA in the last 168 hours. Coagulation Profile: No results for input(s): INR, PROTIME in the last 168 hours. Cardiac Enzymes: No results for input(s): CKTOTAL, CKMB, CKMBINDEX, TROPONINI in the last 168 hours. BNP (last 3 results) No results for input(s): PROBNP in the last 8760 hours. HbA1C: No results for input(s): HGBA1C in the last 72 hours. CBG: No results for input(s): GLUCAP in the last 168 hours. Lipid Profile: No results for input(s): CHOL, HDL, LDLCALC, TRIG, CHOLHDL, LDLDIRECT in the last 72 hours. Thyroid Function Tests: No results for input(s): TSH, T4TOTAL, FREET4, T3FREE, THYROIDAB in the last 72 hours. Anemia Panel: No results for input(s): VITAMINB12, FOLATE, FERRITIN, TIBC, IRON, RETICCTPCT in the last 72 hours. Urine analysis:    Component Value Date/Time   COLORURINE AMBER (A) 10/27/2018 1929   APPEARANCEUR CLEAR 10/27/2018 1929   LABSPEC >1.046 (H) 10/27/2018 1929   PHURINE 5.0 10/27/2018 [place] NEGATIVE 10/27/2018 [place] NEGATIVE 10/27/2018 1929   BILIRUBINUR NEGATIVE 10/27/2018 1929   KETONESUR 20 (A) 10/27/2018 1929   PROTEINUR NEGATIVE 10/27/2018 1929   UROBILINOGEN 0.2 12/12/2013 2356    NITRITE NEGATIVE 10/27/2018 [place] NEGATIVE 10/27/2018 1929  Estill Cotta M.D. [facility] 11/01/2018, 12:08 PM  Pager: [phone] Between 7am to 7pm - call Pager - [phone]  After 7pm go to [email or web address] - password [email or web address]  Call night coverage person covering after 7pm

## 2018-11-01 NOTE — Plan of Care (Signed)

## 2018-11-02 ENCOUNTER — Inpatient Hospital Stay (HOSPITAL_COMMUNITY): Payer: BC Managed Care – PPO

## 2018-11-02 DIAGNOSIS — K5720 Diverticulitis of large intestine with perforation and abscess without bleeding: Principal | ICD-10-CM

## 2018-11-02 LAB — CBC
HCT: 32.7 % — ABNORMAL LOW (ref 36.0–46.0)
Hemoglobin: 10.5 g/dL — ABNORMAL LOW (ref 12.0–15.0)
MCH: 27.9 pg (ref 26.0–34.0)
MCHC: 32.1 g/dL (ref 30.0–36.0)
MCV: 86.7 fL (ref 80.0–100.0)
Platelets: 302 10*3/uL (ref 150–400)
RBC: 3.77 MIL/uL — ABNORMAL LOW (ref 3.87–5.11)
RDW: 15.7 % — ABNORMAL HIGH (ref 11.5–15.5)
WBC: 14.1 10*3/uL — ABNORMAL HIGH (ref 4.0–10.5)
nRBC: 0 % (ref 0.0–0.2)

## 2018-11-02 LAB — BASIC METABOLIC PANEL
Anion gap: 10 (ref 5–15)
BUN: 7 mg/dL (ref 6–20)
CO2: 24 mmol/L (ref 22–32)
Calcium: 7.8 mg/dL — ABNORMAL LOW (ref 8.9–10.3)
Chloride: 102 mmol/L (ref 98–111)
Creatinine, Ser: 0.7 mg/dL (ref 0.44–1.00)
GFR calc Af Amer: >60 mL/min (ref >60)
GFR calc non Af Amer: >60 mL/min (ref >60)
Glucose, Bld: 106 mg/dL — ABNORMAL HIGH (ref 70–99)
Potassium: 3.2 mmol/L — ABNORMAL LOW (ref 3.5–5.1)
Sodium: 136 mmol/L (ref 135–145)

## 2018-11-02 MED ORDER — MIDAZOLAM HCL 2 MG/2ML IJ SOLN
INTRAMUSCULAR | Status: AC | PRN
  Administered 2018-11-02 (×3): 1 mg via INTRAVENOUS

## 2018-11-02 MED ORDER — LIDOCAINE HCL (PF) 1 % IJ SOLN
INTRAMUSCULAR | Status: AC | PRN
  Administered 2018-11-02: 10 mL

## 2018-11-02 MED ORDER — SODIUM CHLORIDE 0.9% FLUSH
5.0000 mL | Freq: Three times a day (TID) | INTRAVENOUS | Status: DC
  Administered 2018-11-02 – 2018-11-05 (×8): 5 mL

## 2018-11-02 MED ORDER — BOOST / RESOURCE BREEZE PO LIQD CUSTOM
1.0000 | Freq: Three times a day (TID) | ORAL | Status: DC
  Administered 2018-11-02: 17:00:00 via ORAL

## 2018-11-02 MED ORDER — PRO-STAT SUGAR FREE PO LIQD
30.0000 mL | Freq: Two times a day (BID) | ORAL | Status: DC
  Administered 2018-11-02 – 2018-11-04 (×4): 30 mL via ORAL
  Filled 2018-11-02 (×6): qty 30

## 2018-11-02 MED ORDER — FENTANYL CITRATE (PF) 100 MCG/2ML IJ SOLN
INTRAMUSCULAR | Status: AC
  Filled 2018-11-02: qty 4

## 2018-11-02 MED ORDER — FENTANYL CITRATE (PF) 100 MCG/2ML IJ SOLN
INTRAMUSCULAR | Status: AC | PRN
  Administered 2018-11-02 (×2): 50 ug via INTRAVENOUS

## 2018-11-02 MED ORDER — POTASSIUM CHLORIDE CRYS ER 20 MEQ PO TBCR
40.0000 meq | EXTENDED_RELEASE_TABLET | Freq: Once | ORAL | Status: AC
  Administered 2018-11-02: 40 meq via ORAL
  Filled 2018-11-02: qty 2

## 2018-11-02 MED ORDER — MIDAZOLAM HCL 2 MG/2ML IJ SOLN
INTRAMUSCULAR | Status: AC
  Filled 2018-11-02: qty 4

## 2018-11-02 NOTE — Plan of Care (Signed)
Plan of care reviewed and discussed with the patient. 

## 2018-11-02 NOTE — Procedures (Signed)
Diverticular abscess  S/p CT DRAIN  No comp Stable ebl 0 50cc pus aspirated cx sent Full report in pacs

## 2018-11-02 NOTE — Progress Notes (Addendum)
[facility]/Trauma Progress Note      Assessment/Plan Relapsing Remitting Multiple Sclerosis - on immunotherapy HTN Depression/Anxiety  Sigmoid diverticulitis with microperforation  - CT 7/29: sigmoid diverticulitis with perforation, no abscess, inflammation consistent with possible inflammatory bowel disease - WBC 20 on admission, 14.1 this AM, afebrile - continue IV zosyn - ttp across lower abdomen - continue CLD for now - repeat CT abd/pelvis 08/03 showed worsening diverticulitis with new pericolonic abscess  - IR to drain today, appreciate their assistance  FEN: NPO for IR drain then ok for back to CLD, breeze and pro-stat  VTE: SCDs ID: cefepime/flagyl 7/29>7/30; Zosyn 7/30>> Foley: none Follow up: Dr. Johney Maine  DISPO: CLD after drain, hopefully we can avoid surgery this admission. we will follow     LOS: 6 days    Subjective: CC: lower abdominal pain  Pt states she is not sleeping well here. She had a loose BM this am without blood. She is having continued lower abdominal pain. No N or V, fever or chills.   Objective: Vital signs in last 24 hours: Temp:  [98.1 F (36.7 C)-98.4 F (36.9 C)] 98.2 F (36.8 C) (08/04 0621) Pulse Rate:  [90-100] 97 (08/04 0621) Resp:  [17-19] 19 (08/04 0621) BP: (108-115)/(65-80) 115/80 (08/04 0621) SpO2:  [95 %-98 %] 98 % (08/04 0621) Last BM Date: 11/01/18  Intake/Output from previous day: 08/03 0701 - 08/04 0700 In: 2035.1 [P.O.:540; I.V.:1386.5; IV Piggyback:108.7] Out: -  Intake/Output this shift: No intake/output data recorded.  PE: Gen:  Alert, NAD, pleasant Pulm:  rate and effort normal  Abd: Soft, TTP across lower abdomen with guarding, non-distended, +BS, no peritonitis  Skin: warm and dry, no rashes  Psych: A&Ox3    Anti-infectives: Anti-infectives (From admission, onward)   Start     Dose/Rate Route Frequency Ordered Stop   10/28/18 0400  piperacillin-tazobactam (ZOSYN) IVPB 3.375 g     3.375  g 12.5 mL/hr over 240 Minutes Intravenous Every 8 hours 10/27/18 2122     10/28/18 0315  ceFEPIme (MAXIPIME) 2 g in sodium chloride 0.9 % 100 mL IVPB  Status:  Discontinued     2 g 200 mL/hr over 30 Minutes Intravenous Every 8 hours 10/27/18 1932 10/27/18 2056   10/28/18 0315  metroNIDAZOLE (FLAGYL) IVPB 500 mg  Status:  Discontinued     500 mg 100 mL/hr over 60 Minutes Intravenous Every 8 hours 10/27/18 1932 10/27/18 2056   10/27/18 1915  ceFEPIme (MAXIPIME) 2 g in sodium chloride 0.9 % 100 mL IVPB     2 g 200 mL/hr over 30 Minutes Intravenous  Once 10/27/18 1912 10/27/18 2030   10/27/18 1915  metroNIDAZOLE (FLAGYL) IVPB 500 mg  Status:  Discontinued     500 mg 100 mL/hr over 60 Minutes Intravenous  Once 10/27/18 1912 10/27/18 2056      Lab Results:  Recent Labs    11/01/18 0309 11/02/18 0254  WBC 14.8* 14.1*  HGB 10.5* 10.5*  HCT 33.8* 32.7*  PLT 359 302   BMET Recent Labs    11/01/18 0309 11/02/18 0254  NA 138 136  K 3.1* 3.2*  CL 103 102  CO2 26 24  GLUCOSE 116* 106*  BUN 9 7  CREATININE 0.71 0.70  CALCIUM 7.8* 7.8*   PT/INR Recent Labs    11/01/18 1531  LABPROT 17.5*  INR 1.5*   CMP     Component Value Date/Time   NA 136 11/02/2018 0254   NA 141 02/05/2016 1533  K 3.2 (L) 11/02/2018 0254   CL 102 11/02/2018 0254   CO2 24 11/02/2018 0254   GLUCOSE 106 (H) 11/02/2018 0254   BUN 7 11/02/2018 0254   BUN 9 02/05/2016 1533   CREATININE 0.70 11/02/2018 0254   CALCIUM 7.8 (L) 11/02/2018 0254   PROT 7.3 10/28/2018 0312   PROT 6.6 12/30/2017 0905   ALBUMIN 3.4 (L) 10/28/2018 0312   ALBUMIN 4.2 12/30/2017 0905   AST 23 10/28/2018 0312   ALT 20 10/28/2018 0312   ALKPHOS 81 10/28/2018 0312   BILITOT 1.4 (H) 10/28/2018 0312   BILITOT 0.7 12/30/2017 0905   GFRNONAA >60 11/02/2018 0254   GFRAA >60 11/02/2018 0254   Lipase     Component Value Date/Time   LIPASE 25 10/27/2018 1223    Studies/Results: Ct Abdomen Pelvis W Contrast  Result Date:  11/01/2018 CLINICAL DATA:  Never ticket itis, known, follow-up EXAM: CT ABDOMEN AND PELVIS WITH CONTRAST TECHNIQUE: Multidetector CT imaging of the abdomen and pelvis was performed using the standard protocol following bolus administration of intravenous contrast. CONTRAST:  169mL OMNIPAQUE IOHEXOL 300 MG/ML SOLN, 83mL OMNIPAQUE IOHEXOL 300 MG/ML SOLN COMPARISON:  October 27, 2018 FINDINGS: Lower chest: The heart is unremarkable. No hiatal hernia. The visualized portions of the lungs are clear. Hepatobiliary: The liver is normal in density without focal abnormality.The main portal vein is patent. The patient is status post cholecystectomy. No biliary ductal dilation. Pancreas: Unremarkable. No pancreatic ductal dilatation or surrounding inflammatory changes. Spleen: Normal in size without focal abnormality. Adrenals/Urinary Tract: Again noted is a 1.5 cm hypodense lesion within the left adrenal gland. The right adrenal gland is unremarkable. The kidneys and collecting system appear normal without evidence of urinary tract calculus or hydronephrosis. Bladder is unremarkable. Stomach/Bowel: The stomach is normal in appearance. There is been interval worsening of the extensive sigmoid colonic diverticulitis with surrounding mesenteric fat stranding changes. There is new extensive loculated fluid collection which extends adjacent to the sigmoid colon and surrounds distal ileal loops within the lower right abdomen best seen on series 2, image 76 and extending inferiorly adjacent to the uterus and posterior/just deep to the right ovary, series 2, image 81. There is new bowel wall thickening involving the distal ileal loops within the right lower abdomen. Vascular/Lymphatic: There are no enlarged mesenteric, retroperitoneal, or pelvic lymph nodes. Scattered small periaortic and mesenteric lymph nodes are seen. No significant vascular findings are present. Reproductive: The uterus is unremarkable. Again noted are probable  dominant follicle/ovarian cyst within both adnexa. The largest cyst 3.5 cm in the left adnexa. There is IUD seen within the uterus. As stated above there is pericolonic abscess which extends adjacent to the right uterus and just deep to the right ovary. Other: No evidence of abdominal wall mass or hernia. Musculoskeletal: No acute or significant osseous findings. IMPRESSION: Interval worsening in the sigmoid colonic diverticulitis with new extensive pericolonic abscess as described above. This surrounds the distal ileal loops causing a ileal wall thickening, and and extends adjacent to the uterus and right ovary. These results were called by telephone at the time of interpretation on 11/01/2018 at 3:02 pm to PA. Brigid Re , who verbally acknowledged these results. Electronically Signed   By: Prudencio Pair M.D.   On: 11/01/2018 15:02      Kalman Drape , [facility] 11/02/2018, 9:56 AM  Pager: [phone] Mon-Wed, Friday 7:00am-4:30pm Thurs 7am-11:30am  Consults: [phone]

## 2018-11-02 NOTE — Progress Notes (Signed)
[facility]                                                                              Patient Demographics  Sandra Bonilla, is a 43 y.o. female, DOB - 01-19-1976, HUD:[id number]  Admit date - 10/27/2018   Admitting Physician Vianne Bulls, MD  Outpatient Primary MD for the patient is [name], MD  Outpatient specialists:   LOS - 6  days   Medical records reviewed and are as summarized below:    Chief Complaint  Patient presents with   Abdominal Pain       Brief summary   Patient is a 43 year old female with history of relapsing remitting multiple sclerosis, hypertension, depression with anxiety, and history of diverticulitis with microperforation in January 2018, presented to ED with abdominal pain.  Patient reported pain in the mid to lower abdomen, constant, with mild nausea but no fever chills diarrhea or vomiting. In ED, patient was slightly tachypneic, tachycardia, normal LFTs, leukocytosis 15.1. CT abdomen and pelvis showed acute sigmoid diverticulitis with microperforation.  Patient was placed on IV fluids, cefepime and Flagyl IV.  General surgery was consulted.   Assessment & Plan    Principal Problem: Acute sigmoid  diverticulitis with microperforation -Continues to complain of lower abdominal pain, nausea  -Continue IV Zosyn -Repeat CT abdomen pelvis showed interval worsening in the sigmoid colon diverticulitis with new extensive pericolonic abscess. -IR consulted for percutaneous drain, general surgery following  Active Problems: Hypokalemia Likely due to diarrhea, replaced    Depression with anxiety -Continue Lexapro, Lamictal    Relapsing remitting multiple sclerosis (HCC) -Currently stable, hold teriflunomide in light of acute infection  Essential hypertension BP soft, continue to hold antihypertensives  ?  Inflammatory bowel disease CT abdomen pelvis finding suggested possible IBD, patient had prior flexible  sigmoidoscopy in 07/2016 which was negative for IBD.  She will likely need a GI outpatient follow-up and possible colonoscopy.    Code Status: Full CODE STATUS DVT Prophylaxis:SCD's Family Communication: Discussed in detail with the patient, all imaging results, lab results explained to the patient    Disposition Plan: Worsening diverticulitis now with diverticular abscess, status post PERC drain today.  Will remain inpatient  Time Spent in minutes 25 minutes  Procedures:  None  Consultants:   General surgery  Antimicrobials:   Anti-infectives (From admission, onward)   Start     Dose/Rate Route Frequency Ordered Stop   10/28/18 0400  piperacillin-tazobactam (ZOSYN) IVPB 3.375 g     3.375 g 12.5 mL/hr over 240 Minutes Intravenous Every 8 hours 10/27/18 2122     10/28/18 0315  ceFEPIme (MAXIPIME) 2 g in sodium chloride 0.9 % 100 mL IVPB  Status:  Discontinued     2 g 200 mL/hr over 30 Minutes Intravenous Every 8 hours 10/27/18 1932 10/27/18 2056   10/28/18 0315  metroNIDAZOLE (FLAGYL) IVPB 500 mg  Status:  Discontinued     500 mg 100 mL/hr over 60 Minutes Intravenous Every 8 hours 10/27/18 1932 10/27/18 2056   10/27/18 1915  ceFEPIme (MAXIPIME) 2 g in sodium chloride 0.9 % 100 mL IVPB  2 g 200 mL/hr over 30 Minutes Intravenous  Once 10/27/18 1912 10/27/18 2030   10/27/18 1915  metroNIDAZOLE (FLAGYL) IVPB 500 mg  Status:  Discontinued     500 mg 100 mL/hr over 60 Minutes Intravenous  Once 10/27/18 1912 10/27/18 2056         Medications  Scheduled Meds:  escitalopram  20 mg Oral Daily   feeding supplement  1 Container Oral TID BM   feeding supplement (PRO-STAT SUGAR FREE 64)  30 mL Oral BID   fentaNYL       lamoTRIgine  400 mg Oral Daily   midazolam       oxybutynin  5 mg Oral BID   saccharomyces boulardii  250 mg Oral BID   sodium chloride flush  5 mL Intracatheter Q8H   Continuous Infusions:  dextrose 5 % and 0.45 % NaCl with KCl 20 mEq/L Stopped  (11/02/18 0449)   piperacillin-tazobactam (ZOSYN)  IV 3.375 g (11/02/18 1250)   PRN Meds:.acetaminophen **OR** acetaminophen, morphine injection, ondansetron (ZOFRAN) IV      Subjective:   [name] was seen and examined today.  Continues to have nausea, diarrhea, lower abdominal pain.  No hematochezia or melena.  No fevers.  Patient denies dizziness, chest pain, shortness of breath, generalized weakness, numbess, tingling.   Objective:   Vitals:   11/02/18 0621 11/02/18 1347 11/02/18 1400 11/02/18 1405  BP: 115/80 128/73 116/78 115/75  Pulse: 97 (!) 102 99 100  Resp: 19 18 16 20   Temp: 98.2 F (36.8 C)     TempSrc: Oral     SpO2: 98% 99%  99%  Weight:      Height:        Intake/Output Summary (Last 24 hours) at 11/02/2018 1420 Last data filed at 11/02/2018 1400 Gross per 24 hour  Intake 1880.89 ml  Output 20 ml  Net 1860.89 ml     Wt Readings from Last 3 Encounters:  10/27/18 104.3 kg  09/06/18 105 kg  12/30/17 100.9 kg   Physical Exam  General: Alert and oriented x 3, NAD  Eyes:   HEENT:   Cardiovascular: S1 S2 clear, RRR. No pedal edema b/l  Respiratory: CTAB, no wheezing, rales or rhonchi  Gastrointestinal: Soft, lower abdominal TTP, nondistended  Ext: no pedal edema bilaterally  Neuro: no new deficits  Musculoskeletal: No cyanosis, clubbing  Skin: No rashes  Psych: Normal affect and demeanor, alert and oriented x3     Data Reviewed:  I have personally reviewed following labs and imaging studies  Micro Results Recent Results (from the past 240 hour(s))  SARS Coronavirus 2 (CEPHEID - Performed in [facility] hospital lab), Hosp Order     Status: None   Collection Time: 10/27/18  7:30 PM   Specimen: Nasopharyngeal Swab  Result Value Ref Range Status   SARS Coronavirus 2 NEGATIVE NEGATIVE Final    Comment: (NOTE) If result is NEGATIVE SARS-CoV-2 target nucleic acids are NOT DETECTED. The SARS-CoV-2 RNA is generally detectable in  upper and lower  respiratory specimens during the acute phase of infection. The lowest  concentration of SARS-CoV-2 viral copies this assay can detect is 250  copies / mL. A negative result does not preclude SARS-CoV-2 infection  and should not be used as the sole basis for treatment or other  patient management decisions.  A negative result may occur with  improper specimen collection / handling, submission of specimen other  than nasopharyngeal swab, presence of viral mutation(s) within the  areas targeted by this assay, and inadequate number of viral copies  (<250 copies / mL). A negative result must be combined with clinical  observations, patient history, and epidemiological information. If result is POSITIVE SARS-CoV-2 target nucleic acids are DETECTED. The SARS-CoV-2 RNA is generally detectable in upper and lower  respiratory specimens dur ing the acute phase of infection.  Positive  results are indicative of active infection with SARS-CoV-2.  Clinical  correlation with patient history and other diagnostic information is  necessary to determine patient infection status.  Positive results do  not rule out bacterial infection or co-infection with other viruses. If result is PRESUMPTIVE POSTIVE SARS-CoV-2 nucleic acids MAY BE PRESENT.   A presumptive positive result was obtained on the submitted specimen  and confirmed on repeat testing.  While 2019 novel coronavirus  (SARS-CoV-2) nucleic acids may be present in the submitted sample  additional confirmatory testing may be necessary for epidemiological  and / or clinical management purposes  to differentiate between  SARS-CoV-2 and other Sarbecovirus currently known to infect humans.  If clinically indicated additional testing with an alternate test  methodology [phone]) is advised. The SARS-CoV-2 RNA is generally  detectable in upper and lower respiratory sp ecimens during the acute  phase of infection. The expected result is  Negative. Fact Sheet for Patients:  StrictlyIdeas.no Fact Sheet for Healthcare Providers: BankingDealers.co.za This test is not yet approved or cleared by the [place] FDA and has been authorized for detection and/or diagnosis of SARS-CoV-2 by FDA under an Emergency Use Authorization (EUA).  This EUA will remain in effect (meaning this test can be used) for the duration of the COVID-19 declaration under Section 564(b)(1) of the Act, 21 U.S.C. section 360bbb-3(b)(1), unless the authorization is terminated or revoked sooner. Performed at [facility], [place] [address], [place], [place] [address]     Radiology Reports Ct Abdomen Pelvis W Contrast  Result Date: 11/01/2018 CLINICAL DATA:  Never ticket itis, known, follow-up EXAM: CT ABDOMEN AND PELVIS WITH CONTRAST TECHNIQUE: Multidetector CT imaging of the abdomen and pelvis was performed using the standard protocol following bolus administration of intravenous contrast. CONTRAST:  15mL OMNIPAQUE IOHEXOL 300 MG/ML SOLN, 46mL OMNIPAQUE IOHEXOL 300 MG/ML SOLN COMPARISON:  October 27, 2018 FINDINGS: Lower chest: The heart is unremarkable. No hiatal hernia. The visualized portions of the lungs are clear. Hepatobiliary: The liver is normal in density without focal abnormality.The main portal vein is patent. The patient is status post cholecystectomy. No biliary ductal dilation. Pancreas: Unremarkable. No pancreatic ductal dilatation or surrounding inflammatory changes. Spleen: Normal in size without focal abnormality. Adrenals/Urinary Tract: Again noted is a 1.5 cm hypodense lesion within the left adrenal gland. The right adrenal gland is unremarkable. The kidneys and collecting system appear normal without evidence of urinary tract calculus or hydronephrosis. Bladder is unremarkable. Stomach/Bowel: The stomach is normal in appearance. There is been interval worsening of the extensive sigmoid  colonic diverticulitis with surrounding mesenteric fat stranding changes. There is new extensive loculated fluid collection which extends adjacent to the sigmoid colon and surrounds distal ileal loops within the lower right abdomen best seen on series 2, image 76 and extending inferiorly adjacent to the uterus and posterior/just deep to the right ovary, series 2, image 81. There is new bowel wall thickening involving the distal ileal loops within the right lower abdomen. Vascular/Lymphatic: There are no enlarged mesenteric, retroperitoneal, or pelvic lymph nodes. Scattered small periaortic and mesenteric lymph nodes are seen. No significant vascular findings  are present. Reproductive: The uterus is unremarkable. Again noted are probable dominant follicle/ovarian cyst within both adnexa. The largest cyst 3.5 cm in the left adnexa. There is IUD seen within the uterus. As stated above there is pericolonic abscess which extends adjacent to the right uterus and just deep to the right ovary. Other: No evidence of abdominal wall mass or hernia. Musculoskeletal: No acute or significant osseous findings. IMPRESSION: Interval worsening in the sigmoid colonic diverticulitis with new extensive pericolonic abscess as described above. This surrounds the distal ileal loops causing a ileal wall thickening, and and extends adjacent to the uterus and right ovary. These results were called by telephone at the time of interpretation on 11/01/2018 at 3:02 pm to PA. Brigid Re , who verbally acknowledged these results. Electronically Signed   By: Prudencio Pair M.D.   On: 11/01/2018 15:02   Ct Abdomen Pelvis W Contrast  Result Date: 10/27/2018 CLINICAL DATA:  Severe lower abdominal pain, nausea, vomiting EXAM: CT ABDOMEN AND PELVIS WITH CONTRAST TECHNIQUE: Multidetector CT imaging of the abdomen and pelvis was performed using the standard protocol following bolus administration of intravenous contrast. CONTRAST:  148mL OMNIPAQUE  IOHEXOL 300 MG/ML  SOLN COMPARISON:  04/21/2016 FINDINGS: Lower chest: No acute abnormality. Hepatobiliary: No focal liver abnormality is seen. Status post cholecystectomy. No biliary dilatation. Pancreas: Unremarkable. No pancreatic ductal dilatation or surrounding inflammatory changes. Spleen: Normal in size without significant abnormality. Adrenals/Urinary Tract: Adrenal glands are unremarkable. Kidneys are normal, without renal calculi, solid lesion, or hydronephrosis. Bladder is unremarkable. Stomach/Bowel: Stomach is within normal limits. Appendix appears normal. There is extensive fatty mural stratification of the ileum and the majority of the colon, with a very tethered appearance of the distal small bowel loops in the right lower quadrant (series 2, image 67). There is sigmoid diverticulosis with extensive thickening and fat stranding about the mid sigmoid. There is a tiny focus of extraluminal air anterior to the mid sigmoid (series 2, image 76). There are additional foci of air in the left upper quadrant mesentery (series 2, image 22, 29). No evidence of abscess. Vascular/Lymphatic: No significant vascular findings are present. No enlarged abdominal or pelvic lymph nodes. Reproductive: IUD is present in the endometrial cavity. Fluid attenuation lesions of the bilateral ovaries consistent with functional cysts or follicles. Other: No abdominal wall hernia or abnormality. Trace ascites in the low abdomen and pelvis. Musculoskeletal: No acute or significant osseous findings. IMPRESSION: 1. There is sigmoid diverticulosis with extensive thickening and fat stranding about the mid sigmoid. There is a tiny focus of extraluminal air anterior to the mid sigmoid (series 2, image 76). Findings are consistent with acute diverticulitis complicated by perforation. There are additional foci of air in the left upper quadrant mesentery (series 2, image 22, 29), which is an unusual location for abdominal air however there  is no overt pneumoperitoneum about the liver or diaphragms. No evidence of abscess. 2. There is extensive fatty mural stratification of the ileum and the majority of the colon, with a very tethered appearance of the distal small bowel loops in the right lower quadrant (series 2, image 67). There is sigmoid diverticulosis with extensive thickening and fat stranding about the mid sigmoid. Findings are generally consistent with inflammatory bowel disease, particularly Crohn's disease, without particular findings to suggest acute inflammation. 3. Trace ascites in the low abdomen and pelvis. Electronically Signed   By: Eddie Candle M.D.   On: 10/27/2018 19:05    Lab Data:  CBC: Recent Labs  Lab 10/28/18 6295 10/29/18 0253 10/30/18 0306 10/31/18 0408 11/01/18 0309 11/02/18 0254  WBC 20.4* 20.4* 17.4* 14.2* 14.8* 14.1*  NEUTROABS 16.6*  --   --   --   --   --   HGB 13.0 12.4 11.9* 12.5 10.5* 10.5*  HCT 42.3 39.4 38.4 40.4 33.8* 32.7*  MCV 90.2 89.3 89.1 87.6 88.0 86.7  PLT 363 294 335 401* 359 284   Basic Metabolic Panel: Recent Labs  Lab 10/29/18 0253 10/30/18 0306 10/31/18 0408 10/31/18 1348 11/01/18 0309 11/02/18 0254  NA 137 135 137  --  138 136  K 3.0* 3.0* 2.7* 3.1* 3.1* 3.2*  CL 99 96* 97*  --  103 102  CO2 24 27 25   --  26 24  GLUCOSE 114* 87 73  --  116* 106*  BUN 12 11 12   --  9 7  CREATININE 0.82 0.80 0.72  --  0.71 0.70  CALCIUM 7.5* 7.7* 7.6*  --  7.8* 7.8*  MG  --   --  2.4  --  2.4  --    GFR: Estimated Creatinine Clearance: 108.4 mL/min (by C-G formula based on SCr of 0.7 mg/dL). Liver Function Tests: Recent Labs  Lab 10/27/18 1223 10/28/18 0312  AST 24 23  ALT 23 20  ALKPHOS 94 81  BILITOT 1.2 1.4*  PROT 7.9 7.3  ALBUMIN 4.0 3.4*   Recent Labs  Lab 10/27/18 1223  LIPASE 25   No results for input(s): AMMONIA in the last 168 hours. Coagulation Profile: Recent Labs  Lab 11/01/18 1531  INR 1.5*   Cardiac Enzymes: No results for input(s):  CKTOTAL, CKMB, CKMBINDEX, TROPONINI in the last 168 hours. BNP (last 3 results) No results for input(s): PROBNP in the last 8760 hours. HbA1C: No results for input(s): HGBA1C in the last 72 hours. CBG: No results for input(s): GLUCAP in the last 168 hours. Lipid Profile: No results for input(s): CHOL, HDL, LDLCALC, TRIG, CHOLHDL, LDLDIRECT in the last 72 hours. Thyroid Function Tests: No results for input(s): TSH, T4TOTAL, FREET4, T3FREE, THYROIDAB in the last 72 hours. Anemia Panel: No results for input(s): VITAMINB12, FOLATE, FERRITIN, TIBC, IRON, RETICCTPCT in the last 72 hours. Urine analysis:    Component Value Date/Time   COLORURINE AMBER (A) 10/27/2018 1929   APPEARANCEUR CLEAR 10/27/2018 1929   LABSPEC >1.046 (H) 10/27/2018 1929   PHURINE 5.0 10/27/2018 1929   GLUCOSEU NEGATIVE 10/27/2018 1929   HGBUR NEGATIVE 10/27/2018 1929   BILIRUBINUR NEGATIVE 10/27/2018 1929   KETONESUR 20 (A) 10/27/2018 1929   PROTEINUR NEGATIVE 10/27/2018 1929   UROBILINOGEN 0.2 12/12/2013 2356   NITRITE NEGATIVE 10/27/2018 1929   LEUKOCYTESUR NEGATIVE 10/27/2018 1929     Verlin Uher M.D. [facility] 11/02/2018, 2:20 PM  Pager: [phone] Between 7am to 7pm - call Pager - [phone]  After 7pm go to [email or web address] - password [email or web address]  Call night coverage person covering after 7pm            [facility]                                                                              Patient Demographics  Kerstie Agent, is a 43 y.o.  female, DOB - [birth year], GXQ:[id number]  Admit date - 10/27/2018   Admitting Physician Vianne Bulls, MD  Outpatient Primary MD for the patient is [name], MD  Outpatient specialists:   LOS - 6  days   Medical records reviewed and are as summarized below:    Chief Complaint  Patient presents with   Abdominal Pain       Brief summary   Patient is a 43 year old female with history of relapsing remitting multiple sclerosis,  hypertension, depression with anxiety, and history of diverticulitis with microperforation in January 2018, presented to ED with abdominal pain.  Patient reported pain in the mid to lower abdomen, constant, with mild nausea but no fever chills diarrhea or vomiting. In ED, patient was slightly tachypneic, tachycardia, normal LFTs, leukocytosis 15.1. CT abdomen and pelvis showed acute sigmoid diverticulitis with microperforation.  Patient was placed on IV fluids, cefepime and Flagyl IV.  General surgery was consulted.   Assessment & Plan    Principal Problem: Acute sigmoid  diverticulitis with microperforation -Continues to have diarrhea (overnight had 3 bowel movements watery, no hematochezia melena), nausea, lower abdominal pain.  States pain 7/10 -Continue clear liquid diet, IV Zosyn, -General surgery following, repeat CT abdomen today.  Active Problems: Hypokalemia Likely due to diarrhea, replaced    Depression with anxiety -Continue Lexapro, Lamictal    Relapsing remitting multiple sclerosis (HCC) -Currently stable, hold teriflunomide in light of acute infection  Essential hypertension BP stable, continue to hold off on antihypertensives  ?  Inflammatory bowel disease CT abdomen pelvis finding suggested possible IBD, patient had prior flexible sigmoidoscopy in 07/2016 which was negative for IBD.  She will likely need a GI outpatient follow-up and possible colonoscopy.    Code Status: Full CODE STATUS DVT Prophylaxis:SCD's Family Communication: Discussed in detail with the patient, all imaging results, lab results explained to the patient    Disposition Plan: Continues to have lower abdominal pain, nausea, diarrhea, repeating CT abdomen today.  Remains inpatient.  Time Spent in minutes 25 minutes  Procedures:  None  Consultants:   General surgery  Antimicrobials:   Anti-infectives (From admission, onward)   Start     Dose/Rate Route Frequency Ordered Stop   10/28/18  0400  piperacillin-tazobactam (ZOSYN) IVPB 3.375 g     3.375 g 12.5 mL/hr over 240 Minutes Intravenous Every 8 hours 10/27/18 2122     10/28/18 0315  ceFEPIme (MAXIPIME) 2 g in sodium chloride 0.9 % 100 mL IVPB  Status:  Discontinued     2 g 200 mL/hr over 30 Minutes Intravenous Every 8 hours 10/27/18 1932 10/27/18 2056   10/28/18 0315  metroNIDAZOLE (FLAGYL) IVPB 500 mg  Status:  Discontinued     500 mg 100 mL/hr over 60 Minutes Intravenous Every 8 hours 10/27/18 1932 10/27/18 2056   10/27/18 1915  ceFEPIme (MAXIPIME) 2 g in sodium chloride 0.9 % 100 mL IVPB     2 g 200 mL/hr over 30 Minutes Intravenous  Once 10/27/18 1912 10/27/18 2030   10/27/18 1915  metroNIDAZOLE (FLAGYL) IVPB 500 mg  Status:  Discontinued     500 mg 100 mL/hr over 60 Minutes Intravenous  Once 10/27/18 1912 10/27/18 2056         Medications  Scheduled Meds:  escitalopram  20 mg Oral Daily   feeding supplement  1 Container Oral TID BM   feeding supplement (PRO-STAT SUGAR FREE 64)  30 mL Oral BID   fentaNYL  lamoTRIgine  400 mg Oral Daily   midazolam       oxybutynin  5 mg Oral BID   saccharomyces boulardii  250 mg Oral BID   sodium chloride flush  5 mL Intracatheter Q8H   Continuous Infusions:  dextrose 5 % and 0.45 % NaCl with KCl 20 mEq/L Stopped (11/02/18 0449)   piperacillin-tazobactam (ZOSYN)  IV 3.375 g (11/02/18 1250)   PRN Meds:.acetaminophen **OR** acetaminophen, morphine injection, ondansetron (ZOFRAN) IV      Subjective:   Sandra Bonilla was seen and examined today.  Overnight watery diarrhea 3 bowel movements, no hematochezia or melena.  No fevers today.  Continues to have nausea and abdominal pain, lower abdomen, 7/10. Patient denies dizziness, chest pain, shortness of breath, generalized weakness, numbess, tingling.   Objective:   Vitals:   11/02/18 0621 11/02/18 1347 11/02/18 1400 11/02/18 1405  BP: 115/80 128/73 116/78 115/75  Pulse: 97 (!) 102 99 100  Resp:  19 18 16 20   Temp: 98.2 F (36.8 C)     TempSrc: Oral     SpO2: 98% 99%  99%  Weight:      Height:        Intake/Output Summary (Last 24 hours) at 11/02/2018 1420 Last data filed at 11/02/2018 1400 Gross per 24 hour  Intake 1880.89 ml  Output 20 ml  Net 1860.89 ml     Wt Readings from Last 3 Encounters:  10/27/18 104.3 kg  09/06/18 105 kg  12/30/17 100.9 kg    Physical Exam  General: Alert and oriented x 3, NAD  Eyes:   HEENT:  Atraumatic, normocephalic  Cardiovascular: S1 S2 clear, RRR. No pedal edema b/l  Respiratory: CTAB, no wheezing, rales or rhonchi  Gastrointestinal: Soft, TTP across lower abdomen, nondistended  Ext: no pedal edema bilaterally  Neuro: no new deficits  Musculoskeletal: No cyanosis, clubbing  Skin: No rashes  Psych: Normal affect and demeanor, alert and oriented x3   Data Reviewed:  I have personally reviewed following labs and imaging studies  Micro Results Recent Results (from the past 240 hour(s))  SARS Coronavirus 2 (CEPHEID - Performed in [place] hospital lab), Hosp Order     Status: None   Collection Time: 10/27/18  7:30 PM   Specimen: Nasopharyngeal Swab  Result Value Ref Range Status   SARS Coronavirus 2 NEGATIVE NEGATIVE Final    Comment: (NOTE) If result is NEGATIVE SARS-CoV-2 target nucleic acids are NOT DETECTED. The SARS-CoV-2 RNA is generally detectable in upper and lower  respiratory specimens during the acute phase of infection. The lowest  concentration of SARS-CoV-2 viral copies this assay can detect is 250  copies / mL. A negative result does not preclude SARS-CoV-2 infection  and should not be used as the sole basis for treatment or other  patient management decisions.  A negative result may occur with  improper specimen collection / handling, submission of specimen other  than nasopharyngeal swab, presence of viral mutation(s) within the  areas targeted by this assay, and inadequate number of viral copies    (<250 copies / mL). A negative result must be combined with clinical  observations, patient history, and epidemiological information. If result is POSITIVE SARS-CoV-2 target nucleic acids are DETECTED. The SARS-CoV-2 RNA is generally detectable in upper and lower  respiratory specimens dur ing the acute phase of infection.  Positive  results are indicative of active infection with SARS-CoV-2.  Clinical  correlation with patient history and other diagnostic information is  necessary  to determine patient infection status.  Positive results do  not rule out bacterial infection or co-infection with other viruses. If result is PRESUMPTIVE POSTIVE SARS-CoV-2 nucleic acids MAY BE PRESENT.   A presumptive positive result was obtained on the submitted specimen  and confirmed on repeat testing.  While 2019 novel coronavirus  (SARS-CoV-2) nucleic acids may be present in the submitted sample  additional confirmatory testing may be necessary for epidemiological  and / or clinical management purposes  to differentiate between  SARS-CoV-2 and other Sarbecovirus currently known to infect humans.  If clinically indicated additional testing with an alternate test  methodology [phone]) is advised. The SARS-CoV-2 RNA is generally  detectable in upper and lower respiratory sp ecimens during the acute  phase of infection. The expected result is Negative. Fact Sheet for Patients:  StrictlyIdeas.no Fact Sheet for Healthcare Providers: BankingDealers.co.za This test is not yet approved or cleared by the [place] FDA and has been authorized for detection and/or diagnosis of SARS-CoV-2 by FDA under an Emergency Use Authorization (EUA).  This EUA will remain in effect (meaning this test can be used) for the duration of the COVID-19 declaration under Section 564(b)(1) of the Act, 21 U.S.C. section 360bbb-3(b)(1), unless the authorization is terminated  or revoked sooner. Performed at [facility], [place] [address] [place], [place] [address]     Radiology Reports Ct Abdomen Pelvis W Contrast  Result Date: 11/01/2018 CLINICAL DATA:  Never ticket itis, known, follow-up EXAM: CT ABDOMEN AND PELVIS WITH CONTRAST TECHNIQUE: Multidetector CT imaging of the abdomen and pelvis was performed using the standard protocol following bolus administration of intravenous contrast. CONTRAST:  123mL OMNIPAQUE IOHEXOL 300 MG/ML SOLN, 96mL OMNIPAQUE IOHEXOL 300 MG/ML SOLN COMPARISON:  October 27, 2018 FINDINGS: Lower chest: The heart is unremarkable. No hiatal hernia. The visualized portions of the lungs are clear. Hepatobiliary: The liver is normal in density without focal abnormality.The main portal vein is patent. The patient is status post cholecystectomy. No biliary ductal dilation. Pancreas: Unremarkable. No pancreatic ductal dilatation or surrounding inflammatory changes. Spleen: Normal in size without focal abnormality. Adrenals/Urinary Tract: Again noted is a 1.5 cm hypodense lesion within the left adrenal gland. The right adrenal gland is unremarkable. The kidneys and collecting system appear normal without evidence of urinary tract calculus or hydronephrosis. Bladder is unremarkable. Stomach/Bowel: The stomach is normal in appearance. There is been interval worsening of the extensive sigmoid colonic diverticulitis with surrounding mesenteric fat stranding changes. There is new extensive loculated fluid collection which extends adjacent to the sigmoid colon and surrounds distal ileal loops within the lower right abdomen best seen on series 2, image 76 and extending inferiorly adjacent to the uterus and posterior/just deep to the right ovary, series 2, image 81. There is new bowel wall thickening involving the distal ileal loops within the right lower abdomen. Vascular/Lymphatic: There are no enlarged mesenteric, retroperitoneal, or pelvic lymph nodes.  Scattered small periaortic and mesenteric lymph nodes are seen. No significant vascular findings are present. Reproductive: The uterus is unremarkable. Again noted are probable dominant follicle/ovarian cyst within both adnexa. The largest cyst 3.5 cm in the left adnexa. There is IUD seen within the uterus. As stated above there is pericolonic abscess which extends adjacent to the right uterus and just deep to the right ovary. Other: No evidence of abdominal wall mass or hernia. Musculoskeletal: No acute or significant osseous findings. IMPRESSION: Interval worsening in the sigmoid colonic diverticulitis with new extensive pericolonic abscess as described above.  This surrounds the distal ileal loops causing a ileal wall thickening, and and extends adjacent to the uterus and right ovary. These results were called by telephone at the time of interpretation on 11/01/2018 at 3:02 pm to PA. Brigid Re , who verbally acknowledged these results. Electronically Signed   By: Prudencio Pair M.D.   On: 11/01/2018 15:02   Ct Abdomen Pelvis W Contrast  Result Date: 10/27/2018 CLINICAL DATA:  Severe lower abdominal pain, nausea, vomiting EXAM: CT ABDOMEN AND PELVIS WITH CONTRAST TECHNIQUE: Multidetector CT imaging of the abdomen and pelvis was performed using the standard protocol following bolus administration of intravenous contrast. CONTRAST:  130mL OMNIPAQUE IOHEXOL 300 MG/ML  SOLN COMPARISON:  04/21/2016 FINDINGS: Lower chest: No acute abnormality. Hepatobiliary: No focal liver abnormality is seen. Status post cholecystectomy. No biliary dilatation. Pancreas: Unremarkable. No pancreatic ductal dilatation or surrounding inflammatory changes. Spleen: Normal in size without significant abnormality. Adrenals/Urinary Tract: Adrenal glands are unremarkable. Kidneys are normal, without renal calculi, solid lesion, or hydronephrosis. Bladder is unremarkable. Stomach/Bowel: Stomach is within normal limits. Appendix appears normal.  There is extensive fatty mural stratification of the ileum and the majority of the colon, with a very tethered appearance of the distal small bowel loops in the right lower quadrant (series 2, image 67). There is sigmoid diverticulosis with extensive thickening and fat stranding about the mid sigmoid. There is a tiny focus of extraluminal air anterior to the mid sigmoid (series 2, image 76). There are additional foci of air in the left upper quadrant mesentery (series 2, image 22, 29). No evidence of abscess. Vascular/Lymphatic: No significant vascular findings are present. No enlarged abdominal or pelvic lymph nodes. Reproductive: IUD is present in the endometrial cavity. Fluid attenuation lesions of the bilateral ovaries consistent with functional cysts or follicles. Other: No abdominal wall hernia or abnormality. Trace ascites in the low abdomen and pelvis. Musculoskeletal: No acute or significant osseous findings. IMPRESSION: 1. There is sigmoid diverticulosis with extensive thickening and fat stranding about the mid sigmoid. There is a tiny focus of extraluminal air anterior to the mid sigmoid (series 2, image 76). Findings are consistent with acute diverticulitis complicated by perforation. There are additional foci of air in the left upper quadrant mesentery (series 2, image 22, 29), which is an unusual location for abdominal air however there is no overt pneumoperitoneum about the liver or diaphragms. No evidence of abscess. 2. There is extensive fatty mural stratification of the ileum and the majority of the colon, with a very tethered appearance of the distal small bowel loops in the right lower quadrant (series 2, image 67). There is sigmoid diverticulosis with extensive thickening and fat stranding about the mid sigmoid. Findings are generally consistent with inflammatory bowel disease, particularly Crohn's disease, without particular findings to suggest acute inflammation. 3. Trace ascites in the low  abdomen and pelvis. Electronically Signed   By: Eddie Candle M.D.   On: 10/27/2018 19:05    Lab Data:  CBC: Recent Labs  Lab 10/28/18 0312 10/29/18 0253 10/30/18 0306 10/31/18 0408 11/01/18 0309 11/02/18 0254  WBC 20.4* 20.4* 17.4* 14.2* 14.8* 14.1*  NEUTROABS 16.6*  --   --   --   --   --   HGB 13.0 12.4 11.9* 12.5 10.5* 10.5*  HCT 42.3 39.4 38.4 40.4 33.8* 32.7*  MCV 90.2 89.3 89.1 87.6 88.0 86.7  PLT 363 294 335 401* 359 063   Basic Metabolic Panel: Recent Labs  Lab 10/29/18 0253 10/30/18 0306 10/31/18 0408  10/31/18 1348 11/01/18 0309 11/02/18 0254  NA 137 135 137  --  138 136  K 3.0* 3.0* 2.7* 3.1* 3.1* 3.2*  CL 99 96* 97*  --  103 102  CO2 24 27 25   --  26 24  GLUCOSE 114* 87 73  --  116* 106*  BUN 12 11 12   --  9 7  CREATININE 0.82 0.80 0.72  --  0.71 0.70  CALCIUM 7.5* 7.7* 7.6*  --  7.8* 7.8*  MG  --   --  2.4  --  2.4  --    GFR: Estimated Creatinine Clearance: 108.4 mL/min (by C-G formula based on SCr of 0.7 mg/dL). Liver Function Tests: Recent Labs  Lab 10/27/18 1223 10/28/18 0312  AST 24 23  ALT 23 20  ALKPHOS 94 81  BILITOT 1.2 1.4*  PROT 7.9 7.3  ALBUMIN 4.0 3.4*   Recent Labs  Lab 10/27/18 1223  LIPASE 25   No results for input(s): AMMONIA in the last 168 hours. Coagulation Profile: Recent Labs  Lab 11/01/18 1531  INR 1.5*   Cardiac Enzymes: No results for input(s): CKTOTAL, CKMB, CKMBINDEX, TROPONINI in the last 168 hours. BNP (last 3 results) No results for input(s): PROBNP in the last 8760 hours. HbA1C: No results for input(s): HGBA1C in the last 72 hours. CBG: No results for input(s): GLUCAP in the last 168 hours. Lipid Profile: No results for input(s): CHOL, HDL, LDLCALC, TRIG, CHOLHDL, LDLDIRECT in the last 72 hours. Thyroid Function Tests: No results for input(s): TSH, T4TOTAL, FREET4, T3FREE, THYROIDAB in the last 72 hours. Anemia Panel: No results for input(s): VITAMINB12, FOLATE, FERRITIN, TIBC, IRON, RETICCTPCT  in the last 72 hours. Urine analysis:    Component Value Date/Time   COLORURINE AMBER (A) 10/27/2018 1929   APPEARANCEUR CLEAR 10/27/2018 1929   LABSPEC >1.046 (H) 10/27/2018 1929   PHURINE 5.0 10/27/2018 1929   GLUCOSEU NEGATIVE 10/27/2018 1929   HGBUR NEGATIVE 10/27/2018 1929   BILIRUBINUR NEGATIVE 10/27/2018 1929   KETONESUR 20 (A) 10/27/2018 1929   PROTEINUR NEGATIVE 10/27/2018 1929   UROBILINOGEN 0.2 12/12/2013 2356   NITRITE NEGATIVE 10/27/2018 1929   LEUKOCYTESUR NEGATIVE 10/27/2018 1929     Felecia Stanfill M.D. [facility] 11/02/2018, 2:20 PM  Pager: [phone] Between 7am to 7pm - call Pager - [phone]  After 7pm go to [email or web address] - password [email or web address]  Call night coverage person covering after 7pm

## 2018-11-03 LAB — CBC
HCT: 32.2 % — ABNORMAL LOW (ref 36.0–46.0)
Hemoglobin: 9.9 g/dL — ABNORMAL LOW (ref 12.0–15.0)
MCH: 26.9 pg (ref 26.0–34.0)
MCHC: 30.7 g/dL (ref 30.0–36.0)
MCV: 87.5 fL (ref 80.0–100.0)
Platelets: 346 10*3/uL (ref 150–400)
RBC: 3.68 MIL/uL — ABNORMAL LOW (ref 3.87–5.11)
RDW: 15.9 % — ABNORMAL HIGH (ref 11.5–15.5)
WBC: 12.2 10*3/uL — ABNORMAL HIGH (ref 4.0–10.5)
nRBC: 0 % (ref 0.0–0.2)

## 2018-11-03 LAB — BASIC METABOLIC PANEL
Anion gap: 9 (ref 5–15)
BUN: 8 mg/dL (ref 6–20)
CO2: 24 mmol/L (ref 22–32)
Calcium: 7.9 mg/dL — ABNORMAL LOW (ref 8.9–10.3)
Chloride: 105 mmol/L (ref 98–111)
Creatinine, Ser: 0.64 mg/dL (ref 0.44–1.00)
GFR calc Af Amer: >60 mL/min (ref >60)
GFR calc non Af Amer: >60 mL/min (ref >60)
Glucose, Bld: 99 mg/dL (ref 70–99)
Potassium: 3.6 mmol/L (ref 3.5–5.1)
Sodium: 138 mmol/L (ref 135–145)

## 2018-11-03 MED ORDER — LOPERAMIDE HCL 2 MG PO CAPS
4.0000 mg | ORAL_CAPSULE | Freq: Once | ORAL | Status: AC
  Administered 2018-11-03: 4 mg via ORAL
  Filled 2018-11-03: qty 2

## 2018-11-03 MED ORDER — ENSURE ENLIVE PO LIQD
237.0000 mL | Freq: Two times a day (BID) | ORAL | Status: DC
  Administered 2018-11-03 – 2018-11-04 (×3): 237 mL via ORAL

## 2018-11-03 NOTE — Progress Notes (Signed)
[facility]                                                                              Patient Demographics  Sandra Bonilla, is a 43 y.o. female, DOB - 11-Oct-1975, PXT:[id number]  Admit date - 10/27/2018   Admitting Physician Vianne Bulls, MD  Outpatient Primary MD for the patient is [name], MD  Outpatient specialists:   LOS - 7  days   Medical records reviewed and are as summarized below:    Chief Complaint  Patient presents with   Abdominal Pain       Brief summary   Patient is a 43 year old female with history of relapsing remitting multiple sclerosis, hypertension, depression with anxiety, and history of diverticulitis with microperforation in January 2018, presented to ED with abdominal pain.  Patient reported pain in the mid to lower abdomen, constant, with mild nausea but no fever chills diarrhea or vomiting. In ED, patient was slightly tachypneic, tachycardia, normal LFTs, leukocytosis 15.1. CT abdomen and pelvis showed acute sigmoid diverticulitis with microperforation.  Patient was placed on IV fluids, cefepime and Flagyl IV.  General surgery was consulted.   Assessment & Plan    Principal Problem: Acute sigmoid  diverticulitis with microperforation and abscess -Patient continues to have lower abdominal pain with nausea, diarrhea.  -She was placed on n.p.o. status, IV fluids, IV Zosyn.  General surgery was consulted -Repeat CT abdomen pelvis on 8/3 showed interval worsening in the sigmoid colon diverticulitis with new extensive pericolonic abscess. -IR was consulted, status post PERC drain on 8/4, purulent material since placement, cultures rare Klebsiella pneumonia, susceptibilities to follow -Feeling slightly better today hence diet advanced to full liquids, encouraged mobility, OOB -Leukocytosis improving  Active Problems: Hypokalemia Resolved    Depression with anxiety -Stable, continue Lexapro, Lamictal    Relapsing  remitting multiple sclerosis (HCC) -Currently stable, hold teriflunomide in light of acute infection  Essential hypertension BP remains soft, continue to hold antihypertensives  ?  Inflammatory bowel disease CT abdomen pelvis finding suggested possible IBD, patient had prior flexible sigmoidoscopy in 07/2016 which was negative for IBD.  She will likely need a GI outpatient follow-up and possible colonoscopy.    Code Status: Full CODE STATUS DVT Prophylaxis:SCD's Family Communication: Discussed in detail with the patient, all imaging results, lab results explained to the patient    Disposition Plan: Advancing diet to full liquids today, follow cultures and sensitivities, pending surgery clearance for disposition  Time Spent in minutes 25 minutes  Procedures:  IR PERC drain on 8/4 Consultants:   General surgery  Antimicrobials:   Anti-infectives (From admission, onward)   Start     Dose/Rate Route Frequency Ordered Stop   10/28/18 0400  piperacillin-tazobactam (ZOSYN) IVPB 3.375 g     3.375 g 12.5 mL/hr over 240 Minutes Intravenous Every 8 hours 10/27/18 2122     10/28/18 0315  ceFEPIme (MAXIPIME) 2 g in sodium chloride 0.9 % 100 mL IVPB  Status:  Discontinued     2 g 200 mL/hr over 30 Minutes Intravenous Every 8 hours 10/27/18 1932 10/27/18 2056   10/28/18 0315  metroNIDAZOLE (FLAGYL)  IVPB 500 mg  Status:  Discontinued     500 mg 100 mL/hr over 60 Minutes Intravenous Every 8 hours 10/27/18 1932 10/27/18 2056   10/27/18 1915  ceFEPIme (MAXIPIME) 2 g in sodium chloride 0.9 % 100 mL IVPB     2 g 200 mL/hr over 30 Minutes Intravenous  Once 10/27/18 1912 10/27/18 2030   10/27/18 1915  metroNIDAZOLE (FLAGYL) IVPB 500 mg  Status:  Discontinued     500 mg 100 mL/hr over 60 Minutes Intravenous  Once 10/27/18 1912 10/27/18 2056         Medications  Scheduled Meds:  escitalopram  20 mg Oral Daily   feeding supplement (ENSURE ENLIVE)  237 mL Oral BID BM   feeding supplement  (PRO-STAT SUGAR FREE 64)  30 mL Oral BID   lamoTRIgine  400 mg Oral Daily   oxybutynin  5 mg Oral BID   saccharomyces boulardii  250 mg Oral BID   sodium chloride flush  5 mL Intracatheter Q8H   Continuous Infusions:  piperacillin-tazobactam (ZOSYN)  IV 3.375 g (11/03/18 1221)   PRN Meds:.acetaminophen **OR** acetaminophen, morphine injection, ondansetron (ZOFRAN) IV      Subjective:   Sandra Bonilla was seen and examined today.  The lower abdominal pain better however left lower quadrant feels sore at the site where drain was placed.  No nausea or vomiting, wants to try full liquids.  Still had diarrhea.  No fevers Patient denies dizziness, chest pain, shortness of breath, generalized weakness, numbess, tingling.   Objective:   Vitals:   11/02/18 1500 11/02/18 1635 11/02/18 2202 11/03/18 0408  BP: 113/61 113/67 103/68 113/72  Pulse: 97 95 77 83  Resp: 16 17 18 16   Temp:  99.4 F (37.4 C) 97.7 F (36.5 C) 97.7 F (36.5 C)  TempSrc: Oral Oral Oral Oral  SpO2: 97% 95% 96% 96%  Weight:      Height:        Intake/Output Summary (Last 24 hours) at 11/03/2018 1529 Last data filed at 11/03/2018 1009 Gross per 24 hour  Intake 2179.44 ml  Output 100 ml  Net 2079.44 ml     Wt Readings from Last 3 Encounters:  10/27/18 104.3 kg  09/06/18 105 kg  12/30/17 100.9 kg    Physical Exam  General: Alert and oriented x 3, NAD  Eyes:   HEENT:  Atraumatic, normocephalic  Cardiovascular: S1 S2 clear, no murmurs, RRR. No pedal edema b/l  Respiratory: CTAB, no wheezing, rales or rhonchi  Gastrointestinal: Soft, mildly tender at LLQ drain area, nondistended, NBS  Ext: no pedal edema bilaterally  Neuro: no new deficits  Musculoskeletal: No cyanosis, clubbing  Skin: No rashes  Psych: Normal affect and demeanor, alert and oriented x3    Data Reviewed:  I have personally reviewed following labs and imaging studies  Micro Results Recent Results (from the past 240  hour(s))  SARS Coronavirus 2 (CEPHEID - Performed in [place] hospital lab), Hosp Order     Status: None   Collection Time: 10/27/18  7:30 PM   Specimen: Nasopharyngeal Swab  Result Value Ref Range Status   SARS Coronavirus 2 NEGATIVE NEGATIVE Final    Comment: (NOTE) If result is NEGATIVE SARS-CoV-2 target nucleic acids are NOT DETECTED. The SARS-CoV-2 RNA is generally detectable in upper and lower  respiratory specimens during the acute phase of infection. The lowest  concentration of SARS-CoV-2 viral copies this assay can detect is 250  copies / mL. A negative result does  not preclude SARS-CoV-2 infection  and should not be used as the sole basis for treatment or other  patient management decisions.  A negative result may occur with  improper specimen collection / handling, submission of specimen other  than nasopharyngeal swab, presence of viral mutation(s) within the  areas targeted by this assay, and inadequate number of viral copies  (<250 copies / mL). A negative result must be combined with clinical  observations, patient history, and epidemiological information. If result is POSITIVE SARS-CoV-2 target nucleic acids are DETECTED. The SARS-CoV-2 RNA is generally detectable in upper and lower  respiratory specimens dur ing the acute phase of infection.  Positive  results are indicative of active infection with SARS-CoV-2.  Clinical  correlation with patient history and other diagnostic information is  necessary to determine patient infection status.  Positive results do  not rule out bacterial infection or co-infection with other viruses. If result is PRESUMPTIVE POSTIVE SARS-CoV-2 nucleic acids MAY BE PRESENT.   A presumptive positive result was obtained on the submitted specimen  and confirmed on repeat testing.  While 2019 novel coronavirus  (SARS-CoV-2) nucleic acids may be present in the submitted sample  additional confirmatory testing may be necessary for  epidemiological  and / or clinical management purposes  to differentiate between  SARS-CoV-2 and other Sarbecovirus currently known to infect humans.  If clinically indicated additional testing with an alternate test  methodology [phone]) is advised. The SARS-CoV-2 RNA is generally  detectable in upper and lower respiratory sp ecimens during the acute  phase of infection. The expected result is Negative. Fact Sheet for Patients:  StrictlyIdeas.no Fact Sheet for Healthcare Providers: BankingDealers.co.za This test is not yet approved or cleared by the [place] FDA and has been authorized for detection and/or diagnosis of SARS-CoV-2 by FDA under an Emergency Use Authorization (EUA).  This EUA will remain in effect (meaning this test can be used) for the duration of the COVID-19 declaration under Section 564(b)(1) of the Act, 21 U.S.C. section 360bbb-3(b)(1), unless the authorization is terminated or revoked sooner. Performed at [facility], [place] [address], [place], [place] [address]   Aerobic/Anaerobic Culture (surgical/deep wound)     Status: None (Preliminary result)   Collection Time: 11/02/18  2:11 PM   Specimen: Abscess  Result Value Ref Range Status   Specimen Description   Final    ABSCESS Performed at [facility] [address], [place], [place] [address]    Special Requests   Final    Normal Performed at [facility], [place] [address], [place], [place] [address]    Gram Stain   Final    ABUNDANT WBC PRESENT,BOTH PMN AND MONONUCLEAR FEW GRAM VARIABLE ROD RARE GRAM POSITIVE COCCI    Culture   Final    RARE KLEBSIELLA PNEUMONIAE SUSCEPTIBILITIES TO FOLLOW Performed at [facility], [place] [address] [place], [place] [address]    Report Status PENDING  Incomplete    Radiology Reports Ct Abdomen Pelvis W Contrast  Result Date: 11/01/2018 CLINICAL DATA:   Never ticket itis, known, follow-up EXAM: CT ABDOMEN AND PELVIS WITH CONTRAST TECHNIQUE: Multidetector CT imaging of the abdomen and pelvis was performed using the standard protocol following bolus administration of intravenous contrast. CONTRAST:  130mL OMNIPAQUE IOHEXOL 300 MG/ML SOLN, 10mL OMNIPAQUE IOHEXOL 300 MG/ML SOLN COMPARISON:  October 27, 2018 FINDINGS: Lower chest: The heart is unremarkable. No hiatal hernia. The visualized portions of the lungs are clear. Hepatobiliary: The  liver is normal in density without focal abnormality.The main portal vein is patent. The patient is status post cholecystectomy. No biliary ductal dilation. Pancreas: Unremarkable. No pancreatic ductal dilatation or surrounding inflammatory changes. Spleen: Normal in size without focal abnormality. Adrenals/Urinary Tract: Again noted is a 1.5 cm hypodense lesion within the left adrenal gland. The right adrenal gland is unremarkable. The kidneys and collecting system appear normal without evidence of urinary tract calculus or hydronephrosis. Bladder is unremarkable. Stomach/Bowel: The stomach is normal in appearance. There is been interval worsening of the extensive sigmoid colonic diverticulitis with surrounding mesenteric fat stranding changes. There is new extensive loculated fluid collection which extends adjacent to the sigmoid colon and surrounds distal ileal loops within the lower right abdomen best seen on series 2, image 76 and extending inferiorly adjacent to the uterus and posterior/just deep to the right ovary, series 2, image 81. There is new bowel wall thickening involving the distal ileal loops within the right lower abdomen. Vascular/Lymphatic: There are no enlarged mesenteric, retroperitoneal, or pelvic lymph nodes. Scattered small periaortic and mesenteric lymph nodes are seen. No significant vascular findings are present. Reproductive: The uterus is unremarkable. Again noted are probable dominant follicle/ovarian cyst  within both adnexa. The largest cyst 3.5 cm in the left adnexa. There is IUD seen within the uterus. As stated above there is pericolonic abscess which extends adjacent to the right uterus and just deep to the right ovary. Other: No evidence of abdominal wall mass or hernia. Musculoskeletal: No acute or significant osseous findings. IMPRESSION: Interval worsening in the sigmoid colonic diverticulitis with new extensive pericolonic abscess as described above. This surrounds the distal ileal loops causing a ileal wall thickening, and and extends adjacent to the uterus and right ovary. These results were called by telephone at the time of interpretation on 11/01/2018 at 3:02 pm to PA. Brigid Re , who verbally acknowledged these results. Electronically Signed   By: Prudencio Pair M.D.   On: 11/01/2018 15:02   Ct Abdomen Pelvis W Contrast  Result Date: 10/27/2018 CLINICAL DATA:  Severe lower abdominal pain, nausea, vomiting EXAM: CT ABDOMEN AND PELVIS WITH CONTRAST TECHNIQUE: Multidetector CT imaging of the abdomen and pelvis was performed using the standard protocol following bolus administration of intravenous contrast. CONTRAST:  152mL OMNIPAQUE IOHEXOL 300 MG/ML  SOLN COMPARISON:  04/21/2016 FINDINGS: Lower chest: No acute abnormality. Hepatobiliary: No focal liver abnormality is seen. Status post cholecystectomy. No biliary dilatation. Pancreas: Unremarkable. No pancreatic ductal dilatation or surrounding inflammatory changes. Spleen: Normal in size without significant abnormality. Adrenals/Urinary Tract: Adrenal glands are unremarkable. Kidneys are normal, without renal calculi, solid lesion, or hydronephrosis. Bladder is unremarkable. Stomach/Bowel: Stomach is within normal limits. Appendix appears normal. There is extensive fatty mural stratification of the ileum and the majority of the colon, with a very tethered appearance of the distal small bowel loops in the right lower quadrant (series 2, image 67).  There is sigmoid diverticulosis with extensive thickening and fat stranding about the mid sigmoid. There is a tiny focus of extraluminal air anterior to the mid sigmoid (series 2, image 76). There are additional foci of air in the left upper quadrant mesentery (series 2, image 22, 29). No evidence of abscess. Vascular/Lymphatic: No significant vascular findings are present. No enlarged abdominal or pelvic lymph nodes. Reproductive: IUD is present in the endometrial cavity. Fluid attenuation lesions of the bilateral ovaries consistent with functional cysts or follicles. Other: No abdominal wall hernia or abnormality. Trace ascites in the low abdomen  and pelvis. Musculoskeletal: No acute or significant osseous findings. IMPRESSION: 1. There is sigmoid diverticulosis with extensive thickening and fat stranding about the mid sigmoid. There is a tiny focus of extraluminal air anterior to the mid sigmoid (series 2, image 76). Findings are consistent with acute diverticulitis complicated by perforation. There are additional foci of air in the left upper quadrant mesentery (series 2, image 22, 29), which is an unusual location for abdominal air however there is no overt pneumoperitoneum about the liver or diaphragms. No evidence of abscess. 2. There is extensive fatty mural stratification of the ileum and the majority of the colon, with a very tethered appearance of the distal small bowel loops in the right lower quadrant (series 2, image 67). There is sigmoid diverticulosis with extensive thickening and fat stranding about the mid sigmoid. Findings are generally consistent with inflammatory bowel disease, particularly Crohn's disease, without particular findings to suggest acute inflammation. 3. Trace ascites in the low abdomen and pelvis. Electronically Signed   By: Eddie Candle M.D.   On: 10/27/2018 19:05   Ct Image Guided Drainage By Percutaneous Catheter  Result Date: 11/02/2018 INDICATION: Pelvic diverticular  abscess EXAM: CT GUIDED DRAINAGE OF PELVIC DIVERTICULAR ABSCESS MEDICATIONS: The patient is currently admitted to the hospital and receiving intravenous antibiotics. The antibiotics were administered within an appropriate time frame prior to the initiation of the procedure. ANESTHESIA/SEDATION: 3.0 mg IV Versed 100 mcg IV Fentanyl Moderate Sedation Time:  15 MINUTES The patient was continuously monitored during the procedure by the interventional radiology nurse under my direct supervision. COMPLICATIONS: None immediate. TECHNIQUE: Informed written consent was obtained from the patient after a thorough discussion of the procedural risks, benefits and alternatives. All questions were addressed. Maximal Sterile Barrier Technique was utilized including caps, mask, sterile gowns, sterile gloves, sterile drape, hand hygiene and skin antiseptic. A timeout was performed prior to the initiation of the procedure. PROCEDURE: Previous imaging reviewed. Patient positioned supine. Noncontrast localization CT performed. The anterior pelvic complex abscess was localized and marked for a left anterior oblique approach. Under sterile conditions and local anesthesia, CT guidance was utilized to advance an 18 gauge needle into the pelvic fluid collection. Syringe aspiration yielded purulent fluid. Guidewire inserted followed by tract dilatation insert a 10 French drain. Drain catheter position confirmed with CT. Syringe aspiration yielded 60 cc purulent fluid. Sample sent for culture. Catheter secured with a prolene suture and a sterile dressing. External suction bulb connected. No immediate complication. Patient tolerated the procedure well. FINDINGS: CT imaging confirms needle placed into the pelvic abscess for drain insertion IMPRESSION: Successful CT-guided pelvic abscess diverticular drain insertion. Sample sent for culture. Electronically Signed   By: Jerilynn Mages.  Shick M.D.   On: 11/02/2018 15:58    Lab Data:  CBC: Recent Labs    Lab 10/28/18 0312  10/30/18 0306 10/31/18 0408 11/01/18 0309 11/02/18 0254 11/03/18 0300  WBC 20.4*   < > 17.4* 14.2* 14.8* 14.1* 12.2*  NEUTROABS 16.6*  --   --   --   --   --   --   HGB 13.0   < > 11.9* 12.5 10.5* 10.5* 9.9*  HCT 42.3   < > 38.4 40.4 33.8* 32.7* 32.2*  MCV 90.2   < > 89.1 87.6 88.0 86.7 87.5  PLT 363   < > 335 401* 359 302 346   < > = values in this interval not displayed.   Basic Metabolic Panel: Recent Labs  Lab 10/30/18 0306 10/31/18 0408 10/31/18  1348 11/01/18 0309 11/02/18 0254 11/03/18 0300  NA 135 137  --  138 136 138  K 3.0* 2.7* 3.1* 3.1* 3.2* 3.6  CL 96* 97*  --  103 102 105  CO2 27 25  --  26 24 24   GLUCOSE 87 73  --  116* 106* 99  BUN 11 12  --  9 7 8   CREATININE 0.80 0.72  --  0.71 0.70 0.64  CALCIUM 7.7* 7.6*  --  7.8* 7.8* 7.9*  MG  --  2.4  --  2.4  --   --    GFR: Estimated Creatinine Clearance: 108.4 mL/min (by C-G formula based on SCr of 0.64 mg/dL). Liver Function Tests: Recent Labs  Lab 10/28/18 0312  AST 23  ALT 20  ALKPHOS 81  BILITOT 1.4*  PROT 7.3  ALBUMIN 3.4*   No results for input(s): LIPASE, AMYLASE in the last 168 hours. No results for input(s): AMMONIA in the last 168 hours. Coagulation Profile: Recent Labs  Lab 11/01/18 1531  INR 1.5*   Cardiac Enzymes: No results for input(s): CKTOTAL, CKMB, CKMBINDEX, TROPONINI in the last 168 hours. BNP (last 3 results) No results for input(s): PROBNP in the last 8760 hours. HbA1C: No results for input(s): HGBA1C in the last 72 hours. CBG: No results for input(s): GLUCAP in the last 168 hours. Lipid Profile: No results for input(s): CHOL, HDL, LDLCALC, TRIG, CHOLHDL, LDLDIRECT in the last 72 hours. Thyroid Function Tests: No results for input(s): TSH, T4TOTAL, FREET4, T3FREE, THYROIDAB in the last 72 hours. Anemia Panel: No results for input(s): VITAMINB12, FOLATE, FERRITIN, TIBC, IRON, RETICCTPCT in the last 72 hours. Urine analysis:    Component Value  Date/Time   COLORURINE AMBER (A) 10/27/2018 1929   APPEARANCEUR CLEAR 10/27/2018 1929   LABSPEC >1.046 (H) 10/27/2018 1929   PHURINE 5.0 10/27/2018 1929   GLUCOSEU NEGATIVE 10/27/2018 1929   HGBUR NEGATIVE 10/27/2018 1929   BILIRUBINUR NEGATIVE 10/27/2018 1929   KETONESUR 20 (A) 10/27/2018 1929   PROTEINUR NEGATIVE 10/27/2018 1929   UROBILINOGEN 0.2 12/12/2013 2356   NITRITE NEGATIVE 10/27/2018 1929   LEUKOCYTESUR NEGATIVE 10/27/2018 1929     Coyt Govoni M.D. [facility] 11/03/2018, 3:29 PM  Pager: [phone] Between 7am to 7pm - call Pager - [phone]  After 7pm go to [email or web address] - password [email or web address]  Call night coverage person covering after 7pm            [facility]                                                                              Patient Demographics  Sandra Bonilla, is a 43 y.o. female, DOB - 06/26/1975, YQI:[id number]  Admit date - 10/27/2018   Admitting Physician Vianne Bulls, MD  Outpatient Primary MD for the patient is [name], MD  Outpatient specialists:   LOS - 7  days   Medical records reviewed and are as summarized below:    Chief Complaint  Patient presents with   Abdominal Pain       Brief summary   Patient is a 44 year old female with history of relapsing remitting multiple sclerosis, hypertension, depression with anxiety, and history of diverticulitis  with microperforation in January 2018, presented to ED with abdominal pain.  Patient reported pain in the mid to lower abdomen, constant, with mild nausea but no fever chills diarrhea or vomiting. In ED, patient was slightly tachypneic, tachycardia, normal LFTs, leukocytosis 15.1. CT abdomen and pelvis showed acute sigmoid diverticulitis with microperforation.  Patient was placed on IV fluids, cefepime and Flagyl IV.  General surgery was consulted.   Assessment & Plan    Principal Problem: Acute sigmoid  diverticulitis with microperforation -Continues to  have diarrhea (overnight had 3 bowel movements watery, no hematochezia melena), nausea, lower abdominal pain.  States pain 7/10 -Continue clear liquid diet, IV Zosyn, -General surgery following, repeat CT abdomen today.  Active Problems: Hypokalemia Likely due to diarrhea, replaced    Depression with anxiety -Continue Lexapro, Lamictal    Relapsing remitting multiple sclerosis (HCC) -Currently stable, hold teriflunomide in light of acute infection  Essential hypertension BP stable, continue to hold off on antihypertensives  ?  Inflammatory bowel disease CT abdomen pelvis finding suggested possible IBD, patient had prior flexible sigmoidoscopy in 07/2016 which was negative for IBD.  She will likely need a GI outpatient follow-up and possible colonoscopy.    Code Status: Full CODE STATUS DVT Prophylaxis:SCD's Family Communication: Discussed in detail with the patient, all imaging results, lab results explained to the patient    Disposition Plan: Continues to have lower abdominal pain, nausea, diarrhea, repeating CT abdomen today.  Remains inpatient.  Time Spent in minutes 25 minutes  Procedures:  None  Consultants:   General surgery  Antimicrobials:   Anti-infectives (From admission, onward)   Start     Dose/Rate Route Frequency Ordered Stop   10/28/18 0400  piperacillin-tazobactam (ZOSYN) IVPB 3.375 g     3.375 g 12.5 mL/hr over 240 Minutes Intravenous Every 8 hours 10/27/18 2122     10/28/18 0315  ceFEPIme (MAXIPIME) 2 g in sodium chloride 0.9 % 100 mL IVPB  Status:  Discontinued     2 g 200 mL/hr over 30 Minutes Intravenous Every 8 hours 10/27/18 1932 10/27/18 2056   10/28/18 0315  metroNIDAZOLE (FLAGYL) IVPB 500 mg  Status:  Discontinued     500 mg 100 mL/hr over 60 Minutes Intravenous Every 8 hours 10/27/18 1932 10/27/18 2056   10/27/18 1915  ceFEPIme (MAXIPIME) 2 g in sodium chloride 0.9 % 100 mL IVPB     2 g 200 mL/hr over 30 Minutes Intravenous  Once 10/27/18  1912 10/27/18 2030   10/27/18 1915  metroNIDAZOLE (FLAGYL) IVPB 500 mg  Status:  Discontinued     500 mg 100 mL/hr over 60 Minutes Intravenous  Once 10/27/18 1912 10/27/18 2056         Medications  Scheduled Meds:  escitalopram  20 mg Oral Daily   feeding supplement (ENSURE ENLIVE)  237 mL Oral BID BM   feeding supplement (PRO-STAT SUGAR FREE 64)  30 mL Oral BID   lamoTRIgine  400 mg Oral Daily   oxybutynin  5 mg Oral BID   saccharomyces boulardii  250 mg Oral BID   sodium chloride flush  5 mL Intracatheter Q8H   Continuous Infusions:  piperacillin-tazobactam (ZOSYN)  IV 3.375 g (11/03/18 1221)   PRN Meds:.acetaminophen **OR** acetaminophen, morphine injection, ondansetron (ZOFRAN) IV      Subjective:   Sandra Bonilla was seen and examined today.  Overnight watery diarrhea 3 bowel movements, no hematochezia or melena.  No fevers today.  Continues to have nausea and abdominal pain, lower abdomen,  7/10. Patient denies dizziness, chest pain, shortness of breath, generalized weakness, numbess, tingling.   Objective:   Vitals:   11/02/18 1500 11/02/18 1635 11/02/18 2202 11/03/18 0408  BP: 113/61 113/67 103/68 113/72  Pulse: 97 95 77 83  Resp: 16 17 18 16   Temp:  99.4 F (37.4 C) 97.7 F (36.5 C) 97.7 F (36.5 C)  TempSrc: Oral Oral Oral Oral  SpO2: 97% 95% 96% 96%  Weight:      Height:        Intake/Output Summary (Last 24 hours) at 11/03/2018 1529 Last data filed at 11/03/2018 1009 Gross per 24 hour  Intake 2179.44 ml  Output 100 ml  Net 2079.44 ml     Wt Readings from Last 3 Encounters:  10/27/18 104.3 kg  09/06/18 105 kg  12/30/17 100.9 kg    Physical Exam  General: Alert and oriented x 3, NAD  Eyes:   HEENT:  Atraumatic, normocephalic  Cardiovascular: S1 S2 clear, RRR. No pedal edema b/l  Respiratory: CTAB, no wheezing, rales or rhonchi  Gastrointestinal: Soft, TTP across lower abdomen, nondistended  Ext: no pedal edema  bilaterally  Neuro: no new deficits  Musculoskeletal: No cyanosis, clubbing  Skin: No rashes  Psych: Normal affect and demeanor, alert and oriented x3   Data Reviewed:  I have personally reviewed following labs and imaging studies  Micro Results Recent Results (from the past 240 hour(s))  SARS Coronavirus 2 (CEPHEID - Performed in [facility] hospital lab), Hosp Order     Status: None   Collection Time: 10/27/18  7:30 PM   Specimen: Nasopharyngeal Swab  Result Value Ref Range Status   SARS Coronavirus 2 NEGATIVE NEGATIVE Final    Comment: (NOTE) If result is NEGATIVE SARS-CoV-2 target nucleic acids are NOT DETECTED. The SARS-CoV-2 RNA is generally detectable in upper and lower  respiratory specimens during the acute phase of infection. The lowest  concentration of SARS-CoV-2 viral copies this assay can detect is 250  copies / mL. A negative result does not preclude SARS-CoV-2 infection  and should not be used as the sole basis for treatment or other  patient management decisions.  A negative result may occur with  improper specimen collection / handling, submission of specimen other  than nasopharyngeal swab, presence of viral mutation(s) within the  areas targeted by this assay, and inadequate number of viral copies  (<250 copies / mL). A negative result must be combined with clinical  observations, patient history, and epidemiological information. If result is POSITIVE SARS-CoV-2 target nucleic acids are DETECTED. The SARS-CoV-2 RNA is generally detectable in upper and lower  respiratory specimens dur ing the acute phase of infection.  Positive  results are indicative of active infection with SARS-CoV-2.  Clinical  correlation with patient history and other diagnostic information is  necessary to determine patient infection status.  Positive results do  not rule out bacterial infection or co-infection with other viruses. If result is PRESUMPTIVE POSTIVE SARS-CoV-2 nucleic  acids MAY BE PRESENT.   A presumptive positive result was obtained on the submitted specimen  and confirmed on repeat testing.  While 2019 novel coronavirus  (SARS-CoV-2) nucleic acids may be present in the submitted sample  additional confirmatory testing may be necessary for epidemiological  and / or clinical management purposes  to differentiate between  SARS-CoV-2 and other Sarbecovirus currently known to infect humans.  If clinically indicated additional testing with an alternate test  methodology [phone]) is advised. The SARS-CoV-2 RNA is generally  detectable in upper and lower respiratory sp ecimens during the acute  phase of infection. The expected result is Negative. Fact Sheet for Patients:  StrictlyIdeas.no Fact Sheet for Healthcare Providers: BankingDealers.co.za This test is not yet approved or cleared by the [place] FDA and has been authorized for detection and/or diagnosis of SARS-CoV-2 by FDA under an Emergency Use Authorization (EUA).  This EUA will remain in effect (meaning this test can be used) for the duration of the COVID-19 declaration under Section 564(b)(1) of the Act, 21 U.S.C. section 360bbb-3(b)(1), unless the authorization is terminated or revoked sooner. Performed at [facility], [place] [address], [place], [place] [address]   Aerobic/Anaerobic Culture (surgical/deep wound)     Status: None (Preliminary result)   Collection Time: 11/02/18  2:11 PM   Specimen: Abscess  Result Value Ref Range Status   Specimen Description   Final    ABSCESS Performed at [facility] [address], [place], [place] [address]    Special Requests   Final    Normal Performed at [facility], [place] [address] [place], [place] [address]    Gram Stain   Final    ABUNDANT WBC PRESENT,BOTH PMN AND MONONUCLEAR FEW GRAM VARIABLE ROD RARE GRAM POSITIVE COCCI     Culture   Final    RARE KLEBSIELLA PNEUMONIAE SUSCEPTIBILITIES TO FOLLOW Performed at [facility], [place] [address] [place], [place] [address]    Report Status PENDING  Incomplete    Radiology Reports Ct Abdomen Pelvis W Contrast  Result Date: 11/01/2018 CLINICAL DATA:  Never ticket itis, known, follow-up EXAM: CT ABDOMEN AND PELVIS WITH CONTRAST TECHNIQUE: Multidetector CT imaging of the abdomen and pelvis was performed using the standard protocol following bolus administration of intravenous contrast. CONTRAST:  162mL OMNIPAQUE IOHEXOL 300 MG/ML SOLN, 35mL OMNIPAQUE IOHEXOL 300 MG/ML SOLN COMPARISON:  October 27, 2018 FINDINGS: Lower chest: The heart is unremarkable. No hiatal hernia. The visualized portions of the lungs are clear. Hepatobiliary: The liver is normal in density without focal abnormality.The main portal vein is patent. The patient is status post cholecystectomy. No biliary ductal dilation. Pancreas: Unremarkable. No pancreatic ductal dilatation or surrounding inflammatory changes. Spleen: Normal in size without focal abnormality. Adrenals/Urinary Tract: Again noted is a 1.5 cm hypodense lesion within the left adrenal gland. The right adrenal gland is unremarkable. The kidneys and collecting system appear normal without evidence of urinary tract calculus or hydronephrosis. Bladder is unremarkable. Stomach/Bowel: The stomach is normal in appearance. There is been interval worsening of the extensive sigmoid colonic diverticulitis with surrounding mesenteric fat stranding changes. There is new extensive loculated fluid collection which extends adjacent to the sigmoid colon and surrounds distal ileal loops within the lower right abdomen best seen on series 2, image 76 and extending inferiorly adjacent to the uterus and posterior/just deep to the right ovary, series 2, image 81. There is new bowel wall thickening involving the distal ileal loops within the right lower abdomen.  Vascular/Lymphatic: There are no enlarged mesenteric, retroperitoneal, or pelvic lymph nodes. Scattered small periaortic and mesenteric lymph nodes are seen. No significant vascular findings are present. Reproductive: The uterus is unremarkable. Again noted are probable dominant follicle/ovarian cyst within both adnexa. The largest cyst 3.5 cm in the left adnexa. There is IUD seen within the uterus. As stated above there is pericolonic abscess which extends adjacent to the right uterus and just deep to the right ovary. Other: No evidence of abdominal wall  mass or hernia. Musculoskeletal: No acute or significant osseous findings. IMPRESSION: Interval worsening in the sigmoid colonic diverticulitis with new extensive pericolonic abscess as described above. This surrounds the distal ileal loops causing a ileal wall thickening, and and extends adjacent to the uterus and right ovary. These results were called by telephone at the time of interpretation on 11/01/2018 at 3:02 pm to PA. Brigid Re , who verbally acknowledged these results. Electronically Signed   By: Prudencio Pair M.D.   On: 11/01/2018 15:02   Ct Abdomen Pelvis W Contrast  Result Date: 10/27/2018 CLINICAL DATA:  Severe lower abdominal pain, nausea, vomiting EXAM: CT ABDOMEN AND PELVIS WITH CONTRAST TECHNIQUE: Multidetector CT imaging of the abdomen and pelvis was performed using the standard protocol following bolus administration of intravenous contrast. CONTRAST:  150mL OMNIPAQUE IOHEXOL 300 MG/ML  SOLN COMPARISON:  04/21/2016 FINDINGS: Lower chest: No acute abnormality. Hepatobiliary: No focal liver abnormality is seen. Status post cholecystectomy. No biliary dilatation. Pancreas: Unremarkable. No pancreatic ductal dilatation or surrounding inflammatory changes. Spleen: Normal in size without significant abnormality. Adrenals/Urinary Tract: Adrenal glands are unremarkable. Kidneys are normal, without renal calculi, solid lesion, or hydronephrosis.  Bladder is unremarkable. Stomach/Bowel: Stomach is within normal limits. Appendix appears normal. There is extensive fatty mural stratification of the ileum and the majority of the colon, with a very tethered appearance of the distal small bowel loops in the right lower quadrant (series 2, image 67). There is sigmoid diverticulosis with extensive thickening and fat stranding about the mid sigmoid. There is a tiny focus of extraluminal air anterior to the mid sigmoid (series 2, image 76). There are additional foci of air in the left upper quadrant mesentery (series 2, image 22, 29). No evidence of abscess. Vascular/Lymphatic: No significant vascular findings are present. No enlarged abdominal or pelvic lymph nodes. Reproductive: IUD is present in the endometrial cavity. Fluid attenuation lesions of the bilateral ovaries consistent with functional cysts or follicles. Other: No abdominal wall hernia or abnormality. Trace ascites in the low abdomen and pelvis. Musculoskeletal: No acute or significant osseous findings. IMPRESSION: 1. There is sigmoid diverticulosis with extensive thickening and fat stranding about the mid sigmoid. There is a tiny focus of extraluminal air anterior to the mid sigmoid (series 2, image 76). Findings are consistent with acute diverticulitis complicated by perforation. There are additional foci of air in the left upper quadrant mesentery (series 2, image 22, 29), which is an unusual location for abdominal air however there is no overt pneumoperitoneum about the liver or diaphragms. No evidence of abscess. 2. There is extensive fatty mural stratification of the ileum and the majority of the colon, with a very tethered appearance of the distal small bowel loops in the right lower quadrant (series 2, image 67). There is sigmoid diverticulosis with extensive thickening and fat stranding about the mid sigmoid. Findings are generally consistent with inflammatory bowel disease, particularly Crohn's  disease, without particular findings to suggest acute inflammation. 3. Trace ascites in the low abdomen and pelvis. Electronically Signed   By: Eddie Candle M.D.   On: 10/27/2018 19:05   Ct Image Guided Drainage By Percutaneous Catheter  Result Date: 11/02/2018 INDICATION: Pelvic diverticular abscess EXAM: CT GUIDED DRAINAGE OF PELVIC DIVERTICULAR ABSCESS MEDICATIONS: The patient is currently admitted to the hospital and receiving intravenous antibiotics. The antibiotics were administered within an appropriate time frame prior to the initiation of the procedure. ANESTHESIA/SEDATION: 3.0 mg IV Versed 100 mcg IV Fentanyl Moderate Sedation Time:  15 MINUTES The  patient was continuously monitored during the procedure by the interventional radiology nurse under my direct supervision. COMPLICATIONS: None immediate. TECHNIQUE: Informed written consent was obtained from the patient after a thorough discussion of the procedural risks, benefits and alternatives. All questions were addressed. Maximal Sterile Barrier Technique was utilized including caps, mask, sterile gowns, sterile gloves, sterile drape, hand hygiene and skin antiseptic. A timeout was performed prior to the initiation of the procedure. PROCEDURE: Previous imaging reviewed. Patient positioned supine. Noncontrast localization CT performed. The anterior pelvic complex abscess was localized and marked for a left anterior oblique approach. Under sterile conditions and local anesthesia, CT guidance was utilized to advance an 18 gauge needle into the pelvic fluid collection. Syringe aspiration yielded purulent fluid. Guidewire inserted followed by tract dilatation insert a 10 French drain. Drain catheter position confirmed with CT. Syringe aspiration yielded 60 cc purulent fluid. Sample sent for culture. Catheter secured with a prolene suture and a sterile dressing. External suction bulb connected. No immediate complication. Patient tolerated the procedure well.  FINDINGS: CT imaging confirms needle placed into the pelvic abscess for drain insertion IMPRESSION: Successful CT-guided pelvic abscess diverticular drain insertion. Sample sent for culture. Electronically Signed   By: Jerilynn Mages.  Shick M.D.   On: 11/02/2018 15:58    Lab Data:  CBC: Recent Labs  Lab 10/28/18 0312  10/30/18 0306 10/31/18 0408 11/01/18 0309 11/02/18 0254 11/03/18 0300  WBC 20.4*   < > 17.4* 14.2* 14.8* 14.1* 12.2*  NEUTROABS 16.6*  --   --   --   --   --   --   HGB 13.0   < > 11.9* 12.5 10.5* 10.5* 9.9*  HCT 42.3   < > 38.4 40.4 33.8* 32.7* 32.2*  MCV 90.2   < > 89.1 87.6 88.0 86.7 87.5  PLT 363   < > 335 401* 359 302 346   < > = values in this interval not displayed.   Basic Metabolic Panel: Recent Labs  Lab 10/30/18 0306 10/31/18 0408 10/31/18 1348 11/01/18 0309 11/02/18 0254 11/03/18 0300  NA 135 137  --  138 136 138  K 3.0* 2.7* 3.1* 3.1* 3.2* 3.6  CL 96* 97*  --  103 102 105  CO2 27 25  --  26 24 24   GLUCOSE 87 73  --  116* 106* 99  BUN 11 12  --  9 7 8   CREATININE 0.80 0.72  --  0.71 0.70 0.64  CALCIUM 7.7* 7.6*  --  7.8* 7.8* 7.9*  MG  --  2.4  --  2.4  --   --    GFR: Estimated Creatinine Clearance: 108.4 mL/min (by C-G formula based on SCr of 0.64 mg/dL). Liver Function Tests: Recent Labs  Lab 10/28/18 0312  AST 23  ALT 20  ALKPHOS 81  BILITOT 1.4*  PROT 7.3  ALBUMIN 3.4*   No results for input(s): LIPASE, AMYLASE in the last 168 hours. No results for input(s): AMMONIA in the last 168 hours. Coagulation Profile: Recent Labs  Lab 11/01/18 1531  INR 1.5*   Cardiac Enzymes: No results for input(s): CKTOTAL, CKMB, CKMBINDEX, TROPONINI in the last 168 hours. BNP (last 3 results) No results for input(s): PROBNP in the last 8760 hours. HbA1C: No results for input(s): HGBA1C in the last 72 hours. CBG: No results for input(s): GLUCAP in the last 168 hours. Lipid Profile: No results for input(s): CHOL, HDL, LDLCALC, TRIG, CHOLHDL, LDLDIRECT  in the last 72 hours. Thyroid Function Tests: No  results for input(s): TSH, T4TOTAL, FREET4, T3FREE, THYROIDAB in the last 72 hours. Anemia Panel: No results for input(s): VITAMINB12, FOLATE, FERRITIN, TIBC, IRON, RETICCTPCT in the last 72 hours. Urine analysis:    Component Value Date/Time   COLORURINE AMBER (A) 10/27/2018 1929   APPEARANCEUR CLEAR 10/27/2018 1929   LABSPEC >1.046 (H) 10/27/2018 1929   PHURINE 5.0 10/27/2018 1929   GLUCOSEU NEGATIVE 10/27/2018 1929   HGBUR NEGATIVE 10/27/2018 1929   BILIRUBINUR NEGATIVE 10/27/2018 1929   KETONESUR 20 (A) 10/27/2018 1929   PROTEINUR NEGATIVE 10/27/2018 1929   UROBILINOGEN 0.2 12/12/2013 2356   NITRITE NEGATIVE 10/27/2018 1929   LEUKOCYTESUR NEGATIVE 10/27/2018 1929     Sandra Bonilla M.D. [facility] 11/03/2018, 3:29 PM  Pager: [phone] Between 7am to 7pm - call Pager - [phone]  After 7pm go to [email or web address] - password [email or web address]  Call night coverage person covering after 7pm

## 2018-11-03 NOTE — Progress Notes (Signed)
Referring Physician(s): Rayburn, Floyce Stakes  Supervising Physician: Arne Cleveland  Patient Status:  [facility] - In-pt  Chief Complaint: None  Subjective:  Diverticular abscess s/p left pelvic drain placement 11/02/2018 by Dr. Vernard Gambles. Patient laying in bed resting comfortably with no complaints. She responds to voice and answers questions appropriately. Left pelvic drain site c/d/i.   Allergies: Septra [sulfamethoxazole-trimethoprim]  Medications: Prior to Admission medications   Medication Sig Start Date End Date Taking? Authorizing Provider  chlorthalidone (HYGROTON) 25 MG tablet Take 12.5 mg by mouth daily.   Yes [provider]  cholecalciferol (VITAMIN D3) 25 MCG (1000 UT) tablet Take 1,000 Units by mouth daily.   Yes [provider]  cyclobenzaprine (FLEXERIL) 5 MG tablet Take 1 tablet (5 mg total) by mouth at bedtime as needed. Patient taking differently: Take 5 mg by mouth at bedtime as needed for muscle spasms.  12/26/16  Yes Sater, Nanine Means, MD  escitalopram (LEXAPRO) 20 MG tablet Take 20 mg by mouth daily.  11/11/17  Yes [provider]  lamoTRIgine (LAMICTAL) 200 MG tablet Take 400 mg by mouth daily.    Yes [provider]  levonorgestrel (MIRENA) 20 MCG/24HR IUD 1 each by Intrauterine route once. 11/14/15  Yes [provider]  oxybutynin (DITROPAN) 5 MG tablet Take 1 tablet (5 mg total) by mouth 2 (two) times daily. 12/30/17  Yes Sater, Nanine Means, MD  Probiotic Product (PROBIOTIC PO) Take 1 capsule by mouth daily.   Yes [provider]  Teriflunomide (AUBAGIO) 14 MG TABS Take 1 tablet by mouth daily. 04/12/18  Yes Sater, Nanine Means, MD  gabapentin (NEURONTIN) 100 MG capsule Take 100 mg by mouth daily as needed (nerve pain).  05/19/18   [provider]  meloxicam (MOBIC) 15 MG tablet Take 1 tablet (15 mg total) by mouth daily. Patient taking differently: Take 15 mg by mouth daily as needed.  12/26/16   Sater, Nanine Means,  MD  Polyvinyl Alcohol-Povidone (REFRESH OP) Place 2 drops into both eyes 4 (four) times daily as needed (dryness).    [provider]     Vital Signs: BP 113/72 (BP Location: Left Arm)    Pulse 83    Temp 97.7 F (36.5 C) (Oral)    Resp 16    Ht 5\' 6"  (1.676 m)    Wt 230 lb (104.3 kg)    SpO2 96%    BMI 37.12 kg/m   Physical Exam Vitals signs and nursing note reviewed.  Constitutional:      General: She is not in acute distress.    Appearance: Normal appearance.  Pulmonary:     Effort: Pulmonary effort is normal. No respiratory distress.  Abdominal:     Comments: Left pelvic drain site without tenderness, erythema, drainage, or active bleeding; approximately 20 cc thick tan fluid in JP drain.  Skin:    General: Skin is warm and dry.  Neurological:     Mental Status: She is alert and oriented to person, place, and time.  Psychiatric:        Mood and Affect: Mood normal.        Behavior: Behavior normal.        Thought Content: Thought content normal.        Judgment: Judgment normal.     Imaging: Ct Abdomen Pelvis W Contrast  Result Date: 11/01/2018 CLINICAL DATA:  Never ticket itis, known, follow-up EXAM: CT ABDOMEN AND PELVIS WITH CONTRAST TECHNIQUE: Multidetector CT imaging of the abdomen  and pelvis was performed using the standard protocol following bolus administration of intravenous contrast. CONTRAST:  146mL OMNIPAQUE IOHEXOL 300 MG/ML SOLN, 48mL OMNIPAQUE IOHEXOL 300 MG/ML SOLN COMPARISON:  October 27, 2018 FINDINGS: Lower chest: The heart is unremarkable. No hiatal hernia. The visualized portions of the lungs are clear. Hepatobiliary: The liver is normal in density without focal abnormality.The main portal vein is patent. The patient is status post cholecystectomy. No biliary ductal dilation. Pancreas: Unremarkable. No pancreatic ductal dilatation or surrounding inflammatory changes. Spleen: Normal in size without focal abnormality. Adrenals/Urinary Tract: Again noted  is a 1.5 cm hypodense lesion within the left adrenal gland. The right adrenal gland is unremarkable. The kidneys and collecting system appear normal without evidence of urinary tract calculus or hydronephrosis. Bladder is unremarkable. Stomach/Bowel: The stomach is normal in appearance. There is been interval worsening of the extensive sigmoid colonic diverticulitis with surrounding mesenteric fat stranding changes. There is new extensive loculated fluid collection which extends adjacent to the sigmoid colon and surrounds distal ileal loops within the lower right abdomen best seen on series 2, image 76 and extending inferiorly adjacent to the uterus and posterior/just deep to the right ovary, series 2, image 81. There is new bowel wall thickening involving the distal ileal loops within the right lower abdomen. Vascular/Lymphatic: There are no enlarged mesenteric, retroperitoneal, or pelvic lymph nodes. Scattered small periaortic and mesenteric lymph nodes are seen. No significant vascular findings are present. Reproductive: The uterus is unremarkable. Again noted are probable dominant follicle/ovarian cyst within both adnexa. The largest cyst 3.5 cm in the left adnexa. There is IUD seen within the uterus. As stated above there is pericolonic abscess which extends adjacent to the right uterus and just deep to the right ovary. Other: No evidence of abdominal wall mass or hernia. Musculoskeletal: No acute or significant osseous findings. IMPRESSION: Interval worsening in the sigmoid colonic diverticulitis with new extensive pericolonic abscess as described above. This surrounds the distal ileal loops causing a ileal wall thickening, and and extends adjacent to the uterus and right ovary. These results were called by telephone at the time of interpretation on 11/01/2018 at 3:02 pm to PA. Brigid Re , who verbally acknowledged these results. Electronically Signed   By: Prudencio Pair M.D.   On: 11/01/2018 15:02   Ct  Image Guided Drainage By Percutaneous Catheter  Result Date: 11/02/2018 INDICATION: Pelvic diverticular abscess EXAM: CT GUIDED DRAINAGE OF PELVIC DIVERTICULAR ABSCESS MEDICATIONS: The patient is currently admitted to the hospital and receiving intravenous antibiotics. The antibiotics were administered within an appropriate time frame prior to the initiation of the procedure. ANESTHESIA/SEDATION: 3.0 mg IV Versed 100 mcg IV Fentanyl Moderate Sedation Time:  15 MINUTES The patient was continuously monitored during the procedure by the interventional radiology nurse under my direct supervision. COMPLICATIONS: None immediate. TECHNIQUE: Informed written consent was obtained from the patient after a thorough discussion of the procedural risks, benefits and alternatives. All questions were addressed. Maximal Sterile Barrier Technique was utilized including caps, mask, sterile gowns, sterile gloves, sterile drape, hand hygiene and skin antiseptic. A timeout was performed prior to the initiation of the procedure. PROCEDURE: Previous imaging reviewed. Patient positioned supine. Noncontrast localization CT performed. The anterior pelvic complex abscess was localized and marked for a left anterior oblique approach. Under sterile conditions and local anesthesia, CT guidance was utilized to advance an 18 gauge needle into the pelvic fluid collection. Syringe aspiration yielded purulent fluid. Guidewire inserted followed by tract dilatation insert a 10 [place]  drain. Drain catheter position confirmed with CT. Syringe aspiration yielded 60 cc purulent fluid. Sample sent for culture. Catheter secured with a prolene suture and a sterile dressing. External suction bulb connected. No immediate complication. Patient tolerated the procedure well. FINDINGS: CT imaging confirms needle placed into the pelvic abscess for drain insertion IMPRESSION: Successful CT-guided pelvic abscess diverticular drain insertion. Sample sent for culture.  Electronically Signed   By: Jerilynn Mages.  Shick M.D.   On: 11/02/2018 15:58    Labs:  CBC: Recent Labs    10/31/18 0408 11/01/18 0309 11/02/18 0254 11/03/18 0300  WBC 14.2* 14.8* 14.1* 12.2*  HGB 12.5 10.5* 10.5* 9.9*  HCT 40.4 33.8* 32.7* 32.2*  PLT 401* 359 302 346    COAGS: Recent Labs    11/01/18 1531  INR 1.5*    BMP: Recent Labs    10/31/18 0408 10/31/18 1348 11/01/18 0309 11/02/18 0254 11/03/18 0300  NA 137  --  138 136 138  K 2.7* 3.1* 3.1* 3.2* 3.6  CL 97*  --  103 102 105  CO2 25  --  26 24 24   GLUCOSE 73  --  116* 106* 99  BUN 12  --  9 7 8   CALCIUM 7.6*  --  7.8* 7.8* 7.9*  CREATININE 0.72  --  0.71 0.70 0.64  GFRNONAA >60  --  >60 >60 >60  GFRAA >60  --  >60 >60 >60    LIVER FUNCTION TESTS: Recent Labs    12/30/17 0905 10/27/18 1223 10/28/18 0312  BILITOT 0.7 1.2 1.4*  AST 15 24 23   ALT 13 23 20   ALKPHOS 101 94 81  PROT 6.6 7.9 7.3  ALBUMIN 4.2 4.0 3.4*    Assessment and Plan:  Diverticular abscess s/p left pelvic drain placement 11/02/2018 by Dr. Vernard Gambles. Left pelvic drain stable with approximately 20 cc thick tan fluid in JP drain. Continue current drain management- continue with Qshift flushes/monitor of output. Plan for repeat CT/possible drain injection when output <10 cc/day (assess for possible removal). Further plans per [facility]- appreciate and agree with management. IR to follow.   Electronically Signed: Earley Abide, PA-C 11/03/2018, 5:14 PM   I spent a total of 25 Minutes at the the patient's bedside AND on the patient's hospital floor or unit, greater than 50% of which was counseling/coordinating care for diverticular abscess s/p left pelvic drain placement.

## 2018-11-03 NOTE — Progress Notes (Signed)
[facility] Progress Note     Subjective: CC: diverticulitis Patient complaining of some LLQ soreness where the drain was placed. Overall pain still improving though. Tolerating CLD and having bowel function. Denies nausea.   Objective: Vital signs in last 24 hours: Temp:  [97.7 F (36.5 C)-99.6 F (37.6 C)] 97.7 F (36.5 C) (08/05 0408) Pulse Rate:  [77-102] 83 (08/05 0408) Resp:  [16-20] 16 (08/05 0408) BP: (103-128)/(61-78) 113/72 (08/05 0408) SpO2:  [95 %-99 %] 96 % (08/05 0408) Last BM Date: 11/02/18  Intake/Output from previous day: 08/04 0701 - 08/05 0700 In: 2108.8 [P.O.:300; I.V.:1698.8; IV Piggyback:100] Out: 155 [Drains:155] Intake/Output this shift: Total I/O In: 120 [P.O.:120] Out: 15 [Drains:15]  PE: Gen:  Alert, NAD, pleasant Card:  Regular rate and rhythm, pedal pulses 2+ BL Pulm:  Normal effort, clear to auscultation bilaterally Abd: Soft, mildly TTP in LLQ and suprapubically, non-distended, +BS, drain with purulent material Skin: warm and dry, no rashes  Psych: A&Ox3   Lab Results:  Recent Labs    11/02/18 0254 11/03/18 0300  WBC 14.1* 12.2*  HGB 10.5* 9.9*  HCT 32.7* 32.2*  PLT 302 346   BMET Recent Labs    11/02/18 0254 11/03/18 0300  NA 136 138  K 3.2* 3.6  CL 102 105  CO2 24 24  GLUCOSE 106* 99  BUN 7 8  CREATININE 0.70 0.64  CALCIUM 7.8* 7.9*   PT/INR Recent Labs    11/01/18 1531  LABPROT 17.5*  INR 1.5*   CMP     Component Value Date/Time   NA 138 11/03/2018 0300   NA 141 02/05/2016 1533   K 3.6 11/03/2018 0300   CL 105 11/03/2018 0300   CO2 24 11/03/2018 0300   GLUCOSE 99 11/03/2018 0300   BUN 8 11/03/2018 0300   BUN 9 02/05/2016 1533   CREATININE 0.64 11/03/2018 0300   CALCIUM 7.9 (L) 11/03/2018 0300   PROT 7.3 10/28/2018 0312   PROT 6.6 12/30/2017 0905   ALBUMIN 3.4 (L) 10/28/2018 0312   ALBUMIN 4.2 12/30/2017 0905   AST 23 10/28/2018 0312   ALT 20 10/28/2018 0312   ALKPHOS 81 10/28/2018  0312   BILITOT 1.4 (H) 10/28/2018 0312   BILITOT 0.7 12/30/2017 0905   GFRNONAA >60 11/03/2018 0300   GFRAA >60 11/03/2018 0300   Lipase     Component Value Date/Time   LIPASE 25 10/27/2018 1223       Studies/Results: Ct Abdomen Pelvis W Contrast  Result Date: 11/01/2018 CLINICAL DATA:  Never ticket itis, known, follow-up EXAM: CT ABDOMEN AND PELVIS WITH CONTRAST TECHNIQUE: Multidetector CT imaging of the abdomen and pelvis was performed using the standard protocol following bolus administration of intravenous contrast. CONTRAST:  143mL OMNIPAQUE IOHEXOL 300 MG/ML SOLN, 36mL OMNIPAQUE IOHEXOL 300 MG/ML SOLN COMPARISON:  October 27, 2018 FINDINGS: Lower chest: The heart is unremarkable. No hiatal hernia. The visualized portions of the lungs are clear. Hepatobiliary: The liver is normal in density without focal abnormality.The main portal vein is patent. The patient is status post cholecystectomy. No biliary ductal dilation. Pancreas: Unremarkable. No pancreatic ductal dilatation or surrounding inflammatory changes. Spleen: Normal in size without focal abnormality. Adrenals/Urinary Tract: Again noted is a 1.5 cm hypodense lesion within the left adrenal gland. The right adrenal gland is unremarkable. The kidneys and collecting system appear normal without evidence of urinary tract calculus or hydronephrosis. Bladder is unremarkable. Stomach/Bowel: The stomach is normal in appearance. There is been interval worsening of the extensive  sigmoid colonic diverticulitis with surrounding mesenteric fat stranding changes. There is new extensive loculated fluid collection which extends adjacent to the sigmoid colon and surrounds distal ileal loops within the lower right abdomen best seen on series 2, image 76 and extending inferiorly adjacent to the uterus and posterior/just deep to the right ovary, series 2, image 81. There is new bowel wall thickening involving the distal ileal loops within the right lower  abdomen. Vascular/Lymphatic: There are no enlarged mesenteric, retroperitoneal, or pelvic lymph nodes. Scattered small periaortic and mesenteric lymph nodes are seen. No significant vascular findings are present. Reproductive: The uterus is unremarkable. Again noted are probable dominant follicle/ovarian cyst within both adnexa. The largest cyst 3.5 cm in the left adnexa. There is IUD seen within the uterus. As stated above there is pericolonic abscess which extends adjacent to the right uterus and just deep to the right ovary. Other: No evidence of abdominal wall mass or hernia. Musculoskeletal: No acute or significant osseous findings. IMPRESSION: Interval worsening in the sigmoid colonic diverticulitis with new extensive pericolonic abscess as described above. This surrounds the distal ileal loops causing a ileal wall thickening, and and extends adjacent to the uterus and right ovary. These results were called by telephone at the time of interpretation on 11/01/2018 at 3:02 pm to PA. Brigid Re , who verbally acknowledged these results. Electronically Signed   By: Prudencio Pair M.D.   On: 11/01/2018 15:02   Ct Image Guided Drainage By Percutaneous Catheter  Result Date: 11/02/2018 INDICATION: Pelvic diverticular abscess EXAM: CT GUIDED DRAINAGE OF PELVIC DIVERTICULAR ABSCESS MEDICATIONS: The patient is currently admitted to the hospital and receiving intravenous antibiotics. The antibiotics were administered within an appropriate time frame prior to the initiation of the procedure. ANESTHESIA/SEDATION: 3.0 mg IV Versed 100 mcg IV Fentanyl Moderate Sedation Time:  15 MINUTES The patient was continuously monitored during the procedure by the interventional radiology nurse under my direct supervision. COMPLICATIONS: None immediate. TECHNIQUE: Informed written consent was obtained from the patient after a thorough discussion of the procedural risks, benefits and alternatives. All questions were addressed. Maximal  Sterile Barrier Technique was utilized including caps, mask, sterile gowns, sterile gloves, sterile drape, hand hygiene and skin antiseptic. A timeout was performed prior to the initiation of the procedure. PROCEDURE: Previous imaging reviewed. Patient positioned supine. Noncontrast localization CT performed. The anterior pelvic complex abscess was localized and marked for a left anterior oblique approach. Under sterile conditions and local anesthesia, CT guidance was utilized to advance an 18 gauge needle into the pelvic fluid collection. Syringe aspiration yielded purulent fluid. Guidewire inserted followed by tract dilatation insert a 10 French drain. Drain catheter position confirmed with CT. Syringe aspiration yielded 60 cc purulent fluid. Sample sent for culture. Catheter secured with a prolene suture and a sterile dressing. External suction bulb connected. No immediate complication. Patient tolerated the procedure well. FINDINGS: CT imaging confirms needle placed into the pelvic abscess for drain insertion IMPRESSION: Successful CT-guided pelvic abscess diverticular drain insertion. Sample sent for culture. Electronically Signed   By: Jerilynn Mages.  Shick M.D.   On: 11/02/2018 15:58    Anti-infectives: Anti-infectives (From admission, onward)   Start     Dose/Rate Route Frequency Ordered Stop   10/28/18 0400  piperacillin-tazobactam (ZOSYN) IVPB 3.375 g     3.375 g 12.5 mL/hr over 240 Minutes Intravenous Every 8 hours 10/27/18 2122     10/28/18 0315  ceFEPIme (MAXIPIME) 2 g in sodium chloride 0.9 % 100 mL IVPB  Status:  Discontinued     2 g 200 mL/hr over 30 Minutes Intravenous Every 8 hours 10/27/18 1932 10/27/18 2056   10/28/18 0315  metroNIDAZOLE (FLAGYL) IVPB 500 mg  Status:  Discontinued     500 mg 100 mL/hr over 60 Minutes Intravenous Every 8 hours 10/27/18 1932 10/27/18 2056   10/27/18 1915  ceFEPIme (MAXIPIME) 2 g in sodium chloride 0.9 % 100 mL IVPB     2 g 200 mL/hr over 30 Minutes Intravenous   Once 10/27/18 1912 10/27/18 2030   10/27/18 1915  metroNIDAZOLE (FLAGYL) IVPB 500 mg  Status:  Discontinued     500 mg 100 mL/hr over 60 Minutes Intravenous  Once 10/27/18 1912 10/27/18 2056       Assessment/Plan Relapsing Remitting Multiple Sclerosis - on immunotherapy HTN Depression/Anxiety  Sigmoid diverticulitis with microperforation  - CT 7/29: sigmoid diverticulitis with perforation, no abscess, inflammation consistent with possible inflammatory bowel disease - repeat CT abd/pelvis 08/03 showed worsening diverticulitis with new pericolonic abscess  - s/p IR drain 11/02/18 - 155 purulent material out since placement, cxs pending  - WBC 20 on admission, now 11, afebrile  - tolerating CLD and pain slightly improved - advance to FLD  FEN: FLD VTE: SCDs ID: cefepime/flagyl 7/29>7/30; Zosyn 7/30>> Foley: none Follow up: Dr. Johney Maine or Dr. [name]  DISPO: advance to FLD, cxs pending.   LOS: 7 days    Brigid Re , [facility] 11/03/2018, 10:58 AM Pager: [phone] Consults: [phone] 7:00 AM - 4:30 PM M, W-F 7:00 AM - 11:30 AM Tues, Sat, Sun

## 2018-11-04 LAB — CBC
HCT: 32.6 % — ABNORMAL LOW (ref 36.0–46.0)
Hemoglobin: 10.2 g/dL — ABNORMAL LOW (ref 12.0–15.0)
MCH: 27.1 pg (ref 26.0–34.0)
MCHC: 31.3 g/dL (ref 30.0–36.0)
MCV: 86.7 fL (ref 80.0–100.0)
Platelets: 380 10*3/uL (ref 150–400)
RBC: 3.76 MIL/uL — ABNORMAL LOW (ref 3.87–5.11)
RDW: 16 % — ABNORMAL HIGH (ref 11.5–15.5)
WBC: 9 10*3/uL (ref 4.0–10.5)
nRBC: 0 % (ref 0.0–0.2)

## 2018-11-04 LAB — BASIC METABOLIC PANEL
Anion gap: 11 (ref 5–15)
BUN: 9 mg/dL (ref 6–20)
CO2: 23 mmol/L (ref 22–32)
Calcium: 7.9 mg/dL — ABNORMAL LOW (ref 8.9–10.3)
Chloride: 104 mmol/L (ref 98–111)
Creatinine, Ser: 0.79 mg/dL (ref 0.44–1.00)
GFR calc Af Amer: >60 mL/min (ref >60)
GFR calc non Af Amer: >60 mL/min (ref >60)
Glucose, Bld: 71 mg/dL (ref 70–99)
Potassium: 3.5 mmol/L (ref 3.5–5.1)
Sodium: 138 mmol/L (ref 135–145)

## 2018-11-04 NOTE — Progress Notes (Signed)
Referring Physician(s): Rayburn, Floyce Stakes  Supervising Physician: Sandi Mariscal  Patient Status:  [facility] - In-pt  Chief Complaint: None  Subjective:  Diverticular abscess s/p left pelvic drain placement 11/02/2018 by Dr. Vernard Gambles. Patient laying in bed resting comfortably with no complaints. She responds to voice and answers questions appropriately. Left pelvic drain site c/d/i.   Allergies: Septra [sulfamethoxazole-trimethoprim]  Medications: Prior to Admission medications   Medication Sig Start Date End Date Taking? Authorizing Provider  chlorthalidone (HYGROTON) 25 MG tablet Take 12.5 mg by mouth daily.   Yes [provider]  cholecalciferol (VITAMIN D3) 25 MCG (1000 UT) tablet Take 1,000 Units by mouth daily.   Yes [provider]  cyclobenzaprine (FLEXERIL) 5 MG tablet Take 1 tablet (5 mg total) by mouth at bedtime as needed. Patient taking differently: Take 5 mg by mouth at bedtime as needed for muscle spasms.  12/26/16  Yes Sater, Nanine Means, MD  escitalopram (LEXAPRO) 20 MG tablet Take 20 mg by mouth daily.  11/11/17  Yes [provider]  lamoTRIgine (LAMICTAL) 200 MG tablet Take 400 mg by mouth daily.    Yes [provider]  levonorgestrel (MIRENA) 20 MCG/24HR IUD 1 each by Intrauterine route once. 11/14/15  Yes [provider]  oxybutynin (DITROPAN) 5 MG tablet Take 1 tablet (5 mg total) by mouth 2 (two) times daily. 12/30/17  Yes Sater, Nanine Means, MD  Probiotic Product (PROBIOTIC PO) Take 1 capsule by mouth daily.   Yes [provider]  Teriflunomide (AUBAGIO) 14 MG TABS Take 1 tablet by mouth daily. 04/12/18  Yes Sater, Nanine Means, MD  gabapentin (NEURONTIN) 100 MG capsule Take 100 mg by mouth daily as needed (nerve pain).  05/19/18   [provider]  meloxicam (MOBIC) 15 MG tablet Take 1 tablet (15 mg total) by mouth daily. Patient taking differently: Take 15 mg by mouth daily as needed.  12/26/16   Sater, Nanine Means, MD    Polyvinyl Alcohol-Povidone (REFRESH OP) Place 2 drops into both eyes 4 (four) times daily as needed (dryness).    [provider]     Vital Signs: BP 134/76 (BP Location: Right Arm)    Pulse 85    Temp 98.1 F (36.7 C)    Resp 16    Ht 5\' 6"  (1.676 m)    Wt 230 lb (104.3 kg)    SpO2 100%    BMI 37.12 kg/m   Physical Exam Vitals signs and nursing note reviewed.  Constitutional:      General: She is not in acute distress.    Appearance: Normal appearance.  Pulmonary:     Effort: Pulmonary effort is normal. No respiratory distress.  Abdominal:     Comments: Left pelvic drain site without tenderness, erythema, drainage, or active bleeding; approximately 10 cc thick tan fluid in JP drain.   Skin:    General: Skin is warm and dry.  Neurological:     Mental Status: She is alert and oriented to person, place, and time.  Psychiatric:        Mood and Affect: Mood normal.        Behavior: Behavior normal.        Thought Content: Thought content normal.        Judgment: Judgment normal.     Imaging: Ct Abdomen Pelvis W Contrast  Result Date: 11/01/2018 CLINICAL DATA:  Never ticket itis, known, follow-up EXAM: CT ABDOMEN AND PELVIS WITH CONTRAST TECHNIQUE: Multidetector CT imaging of the  abdomen and pelvis was performed using the standard protocol following bolus administration of intravenous contrast. CONTRAST:  143mL OMNIPAQUE IOHEXOL 300 MG/ML SOLN, 81mL OMNIPAQUE IOHEXOL 300 MG/ML SOLN COMPARISON:  October 27, 2018 FINDINGS: Lower chest: The heart is unremarkable. No hiatal hernia. The visualized portions of the lungs are clear. Hepatobiliary: The liver is normal in density without focal abnormality.The main portal vein is patent. The patient is status post cholecystectomy. No biliary ductal dilation. Pancreas: Unremarkable. No pancreatic ductal dilatation or surrounding inflammatory changes. Spleen: Normal in size without focal abnormality. Adrenals/Urinary Tract: Again noted is a 1.5  cm hypodense lesion within the left adrenal gland. The right adrenal gland is unremarkable. The kidneys and collecting system appear normal without evidence of urinary tract calculus or hydronephrosis. Bladder is unremarkable. Stomach/Bowel: The stomach is normal in appearance. There is been interval worsening of the extensive sigmoid colonic diverticulitis with surrounding mesenteric fat stranding changes. There is new extensive loculated fluid collection which extends adjacent to the sigmoid colon and surrounds distal ileal loops within the lower right abdomen best seen on series 2, image 76 and extending inferiorly adjacent to the uterus and posterior/just deep to the right ovary, series 2, image 81. There is new bowel wall thickening involving the distal ileal loops within the right lower abdomen. Vascular/Lymphatic: There are no enlarged mesenteric, retroperitoneal, or pelvic lymph nodes. Scattered small periaortic and mesenteric lymph nodes are seen. No significant vascular findings are present. Reproductive: The uterus is unremarkable. Again noted are probable dominant follicle/ovarian cyst within both adnexa. The largest cyst 3.5 cm in the left adnexa. There is IUD seen within the uterus. As stated above there is pericolonic abscess which extends adjacent to the right uterus and just deep to the right ovary. Other: No evidence of abdominal wall mass or hernia. Musculoskeletal: No acute or significant osseous findings. IMPRESSION: Interval worsening in the sigmoid colonic diverticulitis with new extensive pericolonic abscess as described above. This surrounds the distal ileal loops causing a ileal wall thickening, and and extends adjacent to the uterus and right ovary. These results were called by telephone at the time of interpretation on 11/01/2018 at 3:02 pm to PA. Brigid Re , who verbally acknowledged these results. Electronically Signed   By: Prudencio Pair M.D.   On: 11/01/2018 15:02   Ct Image Guided  Drainage By Percutaneous Catheter  Result Date: 11/02/2018 INDICATION: Pelvic diverticular abscess EXAM: CT GUIDED DRAINAGE OF PELVIC DIVERTICULAR ABSCESS MEDICATIONS: The patient is currently admitted to the hospital and receiving intravenous antibiotics. The antibiotics were administered within an appropriate time frame prior to the initiation of the procedure. ANESTHESIA/SEDATION: 3.0 mg IV Versed 100 mcg IV Fentanyl Moderate Sedation Time:  15 MINUTES The patient was continuously monitored during the procedure by the interventional radiology nurse under my direct supervision. COMPLICATIONS: None immediate. TECHNIQUE: Informed written consent was obtained from the patient after a thorough discussion of the procedural risks, benefits and alternatives. All questions were addressed. Maximal Sterile Barrier Technique was utilized including caps, mask, sterile gowns, sterile gloves, sterile drape, hand hygiene and skin antiseptic. A timeout was performed prior to the initiation of the procedure. PROCEDURE: Previous imaging reviewed. Patient positioned supine. Noncontrast localization CT performed. The anterior pelvic complex abscess was localized and marked for a left anterior oblique approach. Under sterile conditions and local anesthesia, CT guidance was utilized to advance an 18 gauge needle into the pelvic fluid collection. Syringe aspiration yielded purulent fluid. Guidewire inserted followed by tract dilatation insert a 10  [place] drain. Drain catheter position confirmed with CT. Syringe aspiration yielded 60 cc purulent fluid. Sample sent for culture. Catheter secured with a prolene suture and a sterile dressing. External suction bulb connected. No immediate complication. Patient tolerated the procedure well. FINDINGS: CT imaging confirms needle placed into the pelvic abscess for drain insertion IMPRESSION: Successful CT-guided pelvic abscess diverticular drain insertion. Sample sent for culture. Electronically  Signed   By: Jerilynn Mages.  Shick M.D.   On: 11/02/2018 15:58    Labs:  CBC: Recent Labs    11/01/18 0309 11/02/18 0254 11/03/18 0300 11/04/18 0244  WBC 14.8* 14.1* 12.2* 9.0  HGB 10.5* 10.5* 9.9* 10.2*  HCT 33.8* 32.7* 32.2* 32.6*  PLT 359 302 346 380    COAGS: Recent Labs    11/01/18 1531  INR 1.5*    BMP: Recent Labs    11/01/18 0309 11/02/18 0254 11/03/18 0300 11/04/18 0244  NA 138 136 138 138  K 3.1* 3.2* 3.6 3.5  CL 103 102 105 104  CO2 26 24 24 23   GLUCOSE 116* 106* 99 71  BUN 9 7 8 9   CALCIUM 7.8* 7.8* 7.9* 7.9*  CREATININE 0.71 0.70 0.64 0.79  GFRNONAA >60 >60 >60 >60  GFRAA >60 >60 >60 >60    LIVER FUNCTION TESTS: Recent Labs    12/30/17 0905 10/27/18 1223 10/28/18 0312  BILITOT 0.7 1.2 1.4*  AST 15 24 23   ALT 13 23 20   ALKPHOS 101 94 81  PROT 6.6 7.9 7.3  ALBUMIN 4.2 4.0 3.4*    Assessment and Plan:  Diverticular abscess s/p left pelvic drain placement 11/02/2018 by Dr. Vernard Gambles. Left pelvic drain stable with approximately 10 cc thick tan fluid in JP drain. Continue current drain management- continue with Qshift flushes/monitor of output. Plan for repeat CT/possible drain injection when output <10 cc/day (assess for possible removal). Further plans per TRH/CCS- appreciate and agree with management. IR to follow.   Electronically Signed: Earley Abide, PA-C 11/04/2018, 1:30 PM   I spent a total of 25 Minutes at the the patient's bedside AND on the patient's hospital floor or unit, greater than 50% of which was counseling/coordinating care for diverticular abscess s/p left pelvic drain placement.

## 2018-11-04 NOTE — Plan of Care (Signed)
Patient progressed to soft diet today. Tolerated well. Walking in hallway. Will continue to monitor!

## 2018-11-04 NOTE — Progress Notes (Addendum)
[facility]/Trauma Progress Note      Assessment/Plan Relapsing Remitting Multiple Sclerosis - on immunotherapy HTN Depression/Anxiety  Sigmoid diverticulitis with microperforation  - CT 7/29: sigmoid diverticulitis with perforation, no abscess, inflammation consistent with possible inflammatory bowel disease - repeat CT abd/pelvis08/03 showed worsening diverticulitis with new pericolonic abscess  - s/p IR drain 11/02/18 - drain down to 45cc in last 24hrs - WBC 20 on admission, now 9.0, afebrile  - tolerating FLD, advance to soft. Pain resolving   DJM:EQAS diet, boost  VTE: SCDs ID: cefepime/flagyl 7/29>7/30; Zosyn 7/30>> Foley:none Follow up:Dr. Johney Maine or Dr. [name]  DISPO:advance diet, cxs show K pneumoniae, sensitivities pending      LOS: 8 days    Subjective: CC: pain at drain site  No nausea, vomiting, fever or chills overnight. Pain is overall improved and pt is feeling well today. Abdominal pain since admission resolved but having pain at drain site. Had  BM today without increased pain.   Objective: Vital signs in last 24 hours: Temp:  [97.6 F (36.4 C)-98.1 F (36.7 C)] 97.6 F (36.4 C) (08/06 0523) Pulse Rate:  [76-84] 83 (08/06 0523) Resp:  [14-17] 14 (08/06 0523) BP: (117-129)/(78-81) 129/81 (08/06 0523) SpO2:  [95 %-100 %] 95 % (08/06 0523) Last BM Date: 11/02/18  Intake/Output from previous day: 08/05 0701 - 08/06 0700 In: 1017.9 [P.O.:510; I.V.:288.7; IV Piggyback:209.2] Out: 45 [Drains:45] Intake/Output this shift: Total I/O In: 120 [P.O.:120] Out: 5 [Drains:5]  PE: Gen: Alert, NAD, pleasant Pulm: rate and effort normal  Abd: Soft,TTP at drain site only, non-distended,+BS, no peritonitis, drain with scant cloudy tan drainage Skin: warm and dry, no rashes  Psych: A&Ox3    Anti-infectives: Anti-infectives (From admission, onward)   Start     Dose/Rate Route Frequency Ordered Stop   10/28/18 0400   piperacillin-tazobactam (ZOSYN) IVPB 3.375 g     3.375 g 12.5 mL/hr over 240 Minutes Intravenous Every 8 hours 10/27/18 2122     10/28/18 0315  ceFEPIme (MAXIPIME) 2 g in sodium chloride 0.9 % 100 mL IVPB  Status:  Discontinued     2 g 200 mL/hr over 30 Minutes Intravenous Every 8 hours 10/27/18 1932 10/27/18 2056   10/28/18 0315  metroNIDAZOLE (FLAGYL) IVPB 500 mg  Status:  Discontinued     500 mg 100 mL/hr over 60 Minutes Intravenous Every 8 hours 10/27/18 1932 10/27/18 2056   10/27/18 1915  ceFEPIme (MAXIPIME) 2 g in sodium chloride 0.9 % 100 mL IVPB     2 g 200 mL/hr over 30 Minutes Intravenous  Once 10/27/18 1912 10/27/18 2030   10/27/18 1915  metroNIDAZOLE (FLAGYL) IVPB 500 mg  Status:  Discontinued     500 mg 100 mL/hr over 60 Minutes Intravenous  Once 10/27/18 1912 10/27/18 2056      Lab Results:  Recent Labs    11/03/18 0300 11/04/18 0244  WBC 12.2* 9.0  HGB 9.9* 10.2*  HCT 32.2* 32.6*  PLT 346 380   BMET Recent Labs    11/03/18 0300 11/04/18 0244  NA 138 138  K 3.6 3.5  CL 105 104  CO2 24 23  GLUCOSE 99 71  BUN 8 9  CREATININE 0.64 0.79  CALCIUM 7.9* 7.9*   PT/INR Recent Labs    11/01/18 1531  LABPROT 17.5*  INR 1.5*   CMP     Component Value Date/Time   NA 138 11/04/2018 0244   NA 141 02/05/2016 1533   K 3.5 11/04/2018 0244  CL 104 11/04/2018 0244   CO2 23 11/04/2018 0244   GLUCOSE 71 11/04/2018 0244   BUN 9 11/04/2018 0244   BUN 9 02/05/2016 1533   CREATININE 0.79 11/04/2018 0244   CALCIUM 7.9 (L) 11/04/2018 0244   PROT 7.3 10/28/2018 0312   PROT 6.6 12/30/2017 0905   ALBUMIN 3.4 (L) 10/28/2018 0312   ALBUMIN 4.2 12/30/2017 0905   AST 23 10/28/2018 0312   ALT 20 10/28/2018 0312   ALKPHOS 81 10/28/2018 0312   BILITOT 1.4 (H) 10/28/2018 0312   BILITOT 0.7 12/30/2017 0905   GFRNONAA >60 11/04/2018 0244   GFRAA >60 11/04/2018 0244   Lipase     Component Value Date/Time   LIPASE 25 10/27/2018 1223    Studies/Results: Ct Image  Guided Drainage By Percutaneous Catheter  Result Date: 11/02/2018 INDICATION: Pelvic diverticular abscess EXAM: CT GUIDED DRAINAGE OF PELVIC DIVERTICULAR ABSCESS MEDICATIONS: The patient is currently admitted to the hospital and receiving intravenous antibiotics. The antibiotics were administered within an appropriate time frame prior to the initiation of the procedure. ANESTHESIA/SEDATION: 3.0 mg IV Versed 100 mcg IV Fentanyl Moderate Sedation Time:  15 MINUTES The patient was continuously monitored during the procedure by the interventional radiology nurse under my direct supervision. COMPLICATIONS: None immediate. TECHNIQUE: Informed written consent was obtained from the patient after a thorough discussion of the procedural risks, benefits and alternatives. All questions were addressed. Maximal Sterile Barrier Technique was utilized including caps, mask, sterile gowns, sterile gloves, sterile drape, hand hygiene and skin antiseptic. A timeout was performed prior to the initiation of the procedure. PROCEDURE: Previous imaging reviewed. Patient positioned supine. Noncontrast localization CT performed. The anterior pelvic complex abscess was localized and marked for a left anterior oblique approach. Under sterile conditions and local anesthesia, CT guidance was utilized to advance an 18 gauge needle into the pelvic fluid collection. Syringe aspiration yielded purulent fluid. Guidewire inserted followed by tract dilatation insert a 10 French drain. Drain catheter position confirmed with CT. Syringe aspiration yielded 60 cc purulent fluid. Sample sent for culture. Catheter secured with a prolene suture and a sterile dressing. External suction bulb connected. No immediate complication. Patient tolerated the procedure well. FINDINGS: CT imaging confirms needle placed into the pelvic abscess for drain insertion IMPRESSION: Successful CT-guided pelvic abscess diverticular drain insertion. Sample sent for culture.  Electronically Signed   By: Jerilynn Mages.  Shick M.D.   On: 11/02/2018 15:58      Kalman Drape , [facility] Surgery 11/04/2018, 10:24 AM  Pager: [phone] Mon-Wed, Friday 7:00am-4:30pm Thurs 7am-11:30am  Consults: [phone]

## 2018-11-04 NOTE — Progress Notes (Signed)
  PROGRESS NOTE  Sandra Bonilla YVO:[id number] DOB: 1976/03/15 DOA: 10/27/2018 PCP: [name], MD   A & P  Acute sigmoid diverticulitis with microperforation and abscess, status post percutaneous drain 8/4 --Continue empiric antibiotic therapy --Diet per surgery --Feeling better today  Depression with anxiety --Stable.  Continue Lexapro and Lamictal.  Essential hypertension --Stable  ?  Inflammatory bowel disease CT abdomen pelvis finding suggested possible IBD, patient had prior flexible sigmoidoscopy in 07/2016 which was negative for IBD.  She will likely need a GI outpatient follow-up and possible colonoscopy.    Relapsing remitting multiple sclerosis  DVT prophylaxis: SCDs Code Status: Full Family Communication: none Disposition Plan: home    Murray Hodgkins, MD  [facility] Direct contact: see [email or web address] (further directions at bottom of note if needed) 7PM-7AM contact night coverage as at bottom of note 11/04/2018, 1:40 PM  LOS: 8 days   Interval History/Subjective  Feels better today.  Some pain at site of drain but otherwise feeling okay.  No nausea or vomiting.  Tolerating diet.  Objective   Vitals:  Vitals:   11/04/18 0523 11/04/18 1314  BP: 129/81 134/76  Pulse: 83 85  Resp: 14 16  Temp: 97.6 F (36.4 C) 98.1 F (36.7 C)  SpO2: 95% 100%    Exam:  Constitutional:  . Appears calm and comfortable Respiratory:  . CTA bilaterally, no w/r/r.  . Respiratory effort normal.  Cardiovascular:  . RRR, no m/r/g . No LE extremity edema   Abdomen:  . Soft.  Mild tenderness left lower quadrant. Psychiatric:  . Mental status o Mood, affect appropriate  I have personally reviewed the following:   Today's Data  . Afebrile, vital signs stable . BMP unremarkable . Hemoglobin stable at 10.2.  WBC has normalized, 9.0.  Lab Data  .   Micro Data  . Intra-abdominal culture Klebsiella pneumoniae resistant to ampicillin, otherwise sensitive to  tested drugs.  ESBL negative.  Scheduled Meds: . escitalopram  20 mg Oral Daily  . feeding supplement (ENSURE ENLIVE)  237 mL Oral BID BM  . feeding supplement (PRO-STAT SUGAR FREE 64)  30 mL Oral BID  . lamoTRIgine  400 mg Oral Daily  . oxybutynin  5 mg Oral BID  . saccharomyces boulardii  250 mg Oral BID  . sodium chloride flush  5 mL Intracatheter Q8H   Continuous Infusions: . piperacillin-tazobactam (ZOSYN)  IV 3.375 g (11/04/18 1100)    Principal Problem:   Sigmoid diverticulitis Active Problems:   Depression with anxiety   Relapsing remitting multiple sclerosis ([place])   Essential hypertension   LOS: 8 days   How to contact the [facility] Attending or Consulting provider [place] or covering provider during after hours [place], for this patient?  1. Check the care team in [facility] and look for a) attending/consulting [facility] provider listed and b) the [facility] team listed 2. Log into [email or web address] and use [place]'s universal password to access. If you do not have the password, please contact the hospital operator. 3. Locate the [facility] provider you are looking for under [facility] and page to a number that you can be directly reached. 4. If you still have difficulty reaching the provider, please page the [facility] (Director on Call) for the Hospitalists listed on amion for assistance.

## 2018-11-05 MED ORDER — AMOXICILLIN-POT CLAVULANATE 875-125 MG PO TABS: 1.0000 | ORAL_TABLET | Freq: Two times a day (BID) | ORAL | 0 refills | Status: AC

## 2018-11-05 MED ORDER — ACETAMINOPHEN 325 MG PO TABS: 650.0000 mg | ORAL_TABLET | Freq: Four times a day (QID) | ORAL | Status: AC | PRN

## 2018-11-05 NOTE — Discharge Summary (Signed)
Physician Discharge Summary  Sandra Bonilla FYB:[id number] DOB: Nov 19, 1975 DOA: 10/27/2018  PCP: [name], MD  Admit date: 10/27/2018 Discharge date: 11/05/2018  Recommendations for Outpatient Follow-up:   Acute sigmoid diverticulitis with microperforation and abscess, status post percutaneous drain 8/4 --Follow-up with Dr. [name] in the outpatient setting for segmental colonic resection --Follow-up in drain clinic  Possible inflammatory bowel disease CT abdomen pelvis finding suggested possible IBD, patient had prior flexible sigmoidoscopy in 07/2016 which was negative for IBD. Follow-up as an outpatient.  Relapsing remitting multiple sclerosis  Follow-up Information    [name], MD Follow up in 2 week(s).   Specialties: Interventional Radiology, Radiology Why: IR scheduler will call you with appointmetn date/time. Please call with any questions or concerns prior to your appointment.  Contact information: [address] [place] [address] [phone]        [name], MD. Go on 2/42/3536.   Specialty: General Surgery Why: Your appointment is 8/17/20020 @11 :30am Please arrive 20 minutes prior to your appointment to check in Contact information: [address] [place] [address] [phone]            Discharge Diagnoses: Principal diagnosis is #1 1. Acute sigmoid diverticulitis with microperforation and abscess 2. Depression with anxiety  3. Essential hypertension  Discharge Condition: improved Disposition: home  Diet recommendation: soft diet  Filed Weights   10/27/18 1134  Weight: 104.3 kg    History of present illness:  43 year old woman presented with abdominal pain, admitted for acute sigmoid diverticulitis with microperforation.  Hospital Course:  Patient was started on empiric antibiotics and seen by general surgery in consultation.  Initially treated with conservative management.  Continued to  have diarrhea and pain, follow-up CT 8/3 showed worsening diverticulitis with new paracolonic abscess.  General surgery recommended drain placement, this was accomplished via interventional radiology without complication.  She subsequently improved, diet was advanced and she was cleared for discharge home.  Acute sigmoid diverticulitis with microperforation and abscess, status post percutaneous drain 8/4 --Tolerating diet, bowel function has returned --Soft diet --Follow-up with Dr. [name] in the outpatient setting for segmental colonic resection --Follow-up in drain clinic --Augmentin x5 days  Depression with anxiety  --Stable.  Continue Lexapro and Lamictal.  Essential hypertension --Stable  Possible inflammatory bowel disease CT abdomen pelvis finding suggested possible IBD, patient had prior flexible sigmoidoscopy in 07/2016 which was negative for IBD. Follow-up as an outpatient.  Relapsing remitting multiple sclerosis  Procedures CT-guided drain 8/4  Today's assessment: S: Feels better.  Minimal pain around drain site.  Tolerating diet.  Ready to go home. O: Vitals:  Vitals:   11/04/18 2225 11/05/18 0419  BP: 121/71 120/77  Pulse: 86 85  Resp: 20 14  Temp: 98 F (36.7 C) 97.7 F (36.5 C)  SpO2: 99% 95%    Constitutional:   Appears calm and comfortable Respiratory:   CTA bilaterally, no w/r/r.   Respiratory effort normal.  Cardiovascular:   RRR, no m/r/g Abdomen:   Soft Psychiatric:   Mental status o Mood, affect appropriate  Drain culture: Klebsiella pneumonia  Discharge Instructions  Discharge Instructions    Discharge instructions   Complete by: As directed    Soft, bland diet. Call your physician or seek immediate medical attention for fever, pain, no bowel movement or worsening of condition. For drain problems call the radiology drain clinic.   Increase activity slowly   Complete by: As directed      Allergies  as of 11/05/2018       Reactions   Septra [sulfamethoxazole-trimethoprim] Rash      Medication List    TAKE these medications   acetaminophen 325 MG tablet Commonly known as: TYLENOL Take 2 tablets (650 mg total) by mouth every 6 (six) hours as needed for mild pain (or Fever >/= 101).   amoxicillin-clavulanate 875-125 MG tablet Commonly known as: Augmentin Take 1 tablet by mouth 2 (two) times daily for 5 days.   chlorthalidone 25 MG tablet Commonly known as: HYGROTON Take 12.5 mg by mouth daily.   cholecalciferol 25 MCG (1000 UT) tablet Commonly known as: VITAMIN D3 Take 1,000 Units by mouth daily.   cyclobenzaprine 5 MG tablet Commonly known as: FLEXERIL Take 1 tablet (5 mg total) by mouth at bedtime as needed. What changed: reasons to take this   escitalopram 20 MG tablet Commonly known as: LEXAPRO Take 20 mg by mouth daily.   gabapentin 100 MG capsule Commonly known as: NEURONTIN Take 100 mg by mouth daily as needed (nerve pain).   lamoTRIgine 200 MG tablet Commonly known as: LAMICTAL Take 400 mg by mouth daily.   levonorgestrel 20 MCG/24HR IUD Commonly known as: MIRENA 1 each by Intrauterine route once.   meloxicam 15 MG tablet Commonly known as: MOBIC Take 1 tablet (15 mg total) by mouth daily. What changed:   when to take this  reasons to take this   oxybutynin 5 MG tablet Commonly known as: DITROPAN Take 1 tablet (5 mg total) by mouth 2 (two) times daily.   PROBIOTIC PO Take 1 capsule by mouth daily.   REFRESH OP Place 2 drops into both eyes 4 (four) times daily as needed (dryness).   Teriflunomide 14 MG Tabs Commonly known as: Aubagio Take 1 tablet by mouth daily.      Allergies  Allergen Reactions   Septra [Sulfamethoxazole-Trimethoprim] Rash    The results of significant diagnostics from this hospitalization (including imaging, microbiology, ancillary and laboratory) are listed below for reference.    Significant Diagnostic Studies: Ct Abdomen Pelvis W  Contrast  Result Date: 11/01/2018 CLINICAL DATA:  Never ticket itis, known, follow-up EXAM: CT ABDOMEN AND PELVIS WITH CONTRAST TECHNIQUE: Multidetector CT imaging of the abdomen and pelvis was performed using the standard protocol following bolus administration of intravenous contrast. CONTRAST:  167mL OMNIPAQUE IOHEXOL 300 MG/ML SOLN, 33mL OMNIPAQUE IOHEXOL 300 MG/ML SOLN COMPARISON:  October 27, 2018 FINDINGS: Lower chest: The heart is unremarkable. No hiatal hernia. The visualized portions of the lungs are clear. Hepatobiliary: The liver is normal in density without focal abnormality.The main portal vein is patent. The patient is status post cholecystectomy. No biliary ductal dilation. Pancreas: Unremarkable. No pancreatic ductal dilatation or surrounding inflammatory changes. Spleen: Normal in size without focal abnormality. Adrenals/Urinary Tract: Again noted is a 1.5 cm hypodense lesion within the left adrenal gland. The right adrenal gland is unremarkable. The kidneys and collecting system appear normal without evidence of urinary tract calculus or hydronephrosis. Bladder is unremarkable. Stomach/Bowel: The stomach is normal in appearance. There is been interval worsening of the extensive sigmoid colonic diverticulitis with surrounding mesenteric fat stranding changes. There is new extensive loculated fluid collection which extends adjacent to the sigmoid colon and surrounds distal ileal loops within the lower right abdomen best seen on series 2, image 76 and extending inferiorly adjacent to the uterus and posterior/just deep to the right ovary, series 2, image 81. There is new bowel wall thickening involving the distal ileal loops within  the right lower abdomen. Vascular/Lymphatic: There are no enlarged mesenteric, retroperitoneal, or pelvic lymph nodes. Scattered small periaortic and mesenteric lymph nodes are seen. No significant vascular findings are present. Reproductive: The uterus is unremarkable. Again  noted are probable dominant follicle/ovarian cyst within both adnexa. The largest cyst 3.5 cm in the left adnexa. There is IUD seen within the uterus. As stated above there is pericolonic abscess which extends adjacent to the right uterus and just deep to the right ovary. Other: No evidence of abdominal wall mass or hernia. Musculoskeletal: No acute or significant osseous findings. IMPRESSION: Interval worsening in the sigmoid colonic diverticulitis with new extensive pericolonic abscess as described above. This surrounds the distal ileal loops causing a ileal wall thickening, and and extends adjacent to the uterus and right ovary. These results were called by telephone at the time of interpretation on 11/01/2018 at 3:02 pm to PA. Brigid Re , who verbally acknowledged these results. Electronically Signed   By: Prudencio Pair M.D.   On: 11/01/2018 15:02   Ct Abdomen Pelvis W Contrast  Result Date: 10/27/2018 CLINICAL DATA:  Severe lower abdominal pain, nausea, vomiting EXAM: CT ABDOMEN AND PELVIS WITH CONTRAST TECHNIQUE: Multidetector CT imaging of the abdomen and pelvis was performed using the standard protocol following bolus administration of intravenous contrast. CONTRAST:  150mL OMNIPAQUE IOHEXOL 300 MG/ML  SOLN COMPARISON:  04/21/2016 FINDINGS: Lower chest: No acute abnormality. Hepatobiliary: No focal liver abnormality is seen. Status post cholecystectomy. No biliary dilatation. Pancreas: Unremarkable. No pancreatic ductal dilatation or surrounding inflammatory changes. Spleen: Normal in size without significant abnormality. Adrenals/Urinary Tract: Adrenal glands are unremarkable. Kidneys are normal, without renal calculi, solid lesion, or hydronephrosis. Bladder is unremarkable. Stomach/Bowel: Stomach is within normal limits. Appendix appears normal. There is extensive fatty mural stratification of the ileum and the majority of the colon, with a very tethered appearance of the distal small bowel loops in  the right lower quadrant (series 2, image 67). There is sigmoid diverticulosis with extensive thickening and fat stranding about the mid sigmoid. There is a tiny focus of extraluminal air anterior to the mid sigmoid (series 2, image 76). There are additional foci of air in the left upper quadrant mesentery (series 2, image 22, 29). No evidence of abscess. Vascular/Lymphatic: No significant vascular findings are present. No enlarged abdominal or pelvic lymph nodes. Reproductive: IUD is present in the endometrial cavity. Fluid attenuation lesions of the bilateral ovaries consistent with functional cysts or follicles. Other: No abdominal wall hernia or abnormality. Trace ascites in the low abdomen and pelvis. Musculoskeletal: No acute or significant osseous findings. IMPRESSION: 1. There is sigmoid diverticulosis with extensive thickening and fat stranding about the mid sigmoid. There is a tiny focus of extraluminal air anterior to the mid sigmoid (series 2, image 76). Findings are consistent with acute diverticulitis complicated by perforation. There are additional foci of air in the left upper quadrant mesentery (series 2, image 22, 29), which is an unusual location for abdominal air however there is no overt pneumoperitoneum about the liver or diaphragms. No evidence of abscess. 2. There is extensive fatty mural stratification of the ileum and the majority of the colon, with a very tethered appearance of the distal small bowel loops in the right lower quadrant (series 2, image 67). There is sigmoid diverticulosis with extensive thickening and fat stranding about the mid sigmoid. Findings are generally consistent with inflammatory bowel disease, particularly Crohn's disease, without particular findings to suggest acute inflammation. 3. Trace ascites in the  low abdomen and pelvis. Electronically Signed   By: Eddie Candle M.D.   On: 10/27/2018 19:05   Ct Image Guided Drainage By Percutaneous Catheter  Result Date:  11/02/2018 INDICATION: Pelvic diverticular abscess EXAM: CT GUIDED DRAINAGE OF PELVIC DIVERTICULAR ABSCESS MEDICATIONS: The patient is currently admitted to the hospital and receiving intravenous antibiotics. The antibiotics were administered within an appropriate time frame prior to the initiation of the procedure. ANESTHESIA/SEDATION: 3.0 mg IV Versed 100 mcg IV Fentanyl Moderate Sedation Time:  15 MINUTES The patient was continuously monitored during the procedure by the interventional radiology nurse under my direct supervision. COMPLICATIONS: None immediate. TECHNIQUE: Informed written consent was obtained from the patient after a thorough discussion of the procedural risks, benefits and alternatives. All questions were addressed. Maximal Sterile Barrier Technique was utilized including caps, mask, sterile gowns, sterile gloves, sterile drape, hand hygiene and skin antiseptic. A timeout was performed prior to the initiation of the procedure. PROCEDURE: Previous imaging reviewed. Patient positioned supine. Noncontrast localization CT performed. The anterior pelvic complex abscess was localized and marked for a left anterior oblique approach. Under sterile conditions and local anesthesia, CT guidance was utilized to advance an 18 gauge needle into the pelvic fluid collection. Syringe aspiration yielded purulent fluid. Guidewire inserted followed by tract dilatation insert a 10 French drain. Drain catheter position confirmed with CT. Syringe aspiration yielded 60 cc purulent fluid. Sample sent for culture. Catheter secured with a prolene suture and a sterile dressing. External suction bulb connected. No immediate complication. Patient tolerated the procedure well. FINDINGS: CT imaging confirms needle placed into the pelvic abscess for drain insertion IMPRESSION: Successful CT-guided pelvic abscess diverticular drain insertion. Sample sent for culture. Electronically Signed   By: Jerilynn Mages.  Shick M.D.   On: 11/02/2018  15:58    Microbiology: Recent Results (from the past 240 hour(s))  SARS Coronavirus 2 (CEPHEID - Performed in [place] hospital lab), Hosp Order     Status: None   Collection Time: 10/27/18  7:30 PM   Specimen: Nasopharyngeal Swab  Result Value Ref Range Status   SARS Coronavirus 2 NEGATIVE NEGATIVE Final    Comment: (NOTE) If result is NEGATIVE SARS-CoV-2 target nucleic acids are NOT DETECTED. The SARS-CoV-2 RNA is generally detectable in upper and lower  respiratory specimens during the acute phase of infection. The lowest  concentration of SARS-CoV-2 viral copies this assay can detect is 250  copies / mL. A negative result does not preclude SARS-CoV-2 infection  and should not be used as the sole basis for treatment or other  patient management decisions.  A negative result may occur with  improper specimen collection / handling, submission of specimen other  than nasopharyngeal swab, presence of viral mutation(s) within the  areas targeted by this assay, and inadequate number of viral copies  (<250 copies / mL). A negative result must be combined with clinical  observations, patient history, and epidemiological information. If result is POSITIVE SARS-CoV-2 target nucleic acids are DETECTED. The SARS-CoV-2 RNA is generally detectable in upper and lower  respiratory specimens dur ing the acute phase of infection.  Positive  results are indicative of active infection with SARS-CoV-2.  Clinical  correlation with patient history and other diagnostic information is  necessary to determine patient infection status.  Positive results do  not rule out bacterial infection or co-infection with other viruses. If result is PRESUMPTIVE POSTIVE SARS-CoV-2 nucleic acids MAY BE PRESENT.   A presumptive positive result was obtained on the submitted specimen  and confirmed on repeat testing.  While 2019 novel coronavirus  (SARS-CoV-2) nucleic acids may be present in the submitted sample    additional confirmatory testing may be necessary for epidemiological  and / or clinical management purposes  to differentiate between  SARS-CoV-2 and other Sarbecovirus currently known to infect humans.  If clinically indicated additional testing with an alternate test  methodology [phone]) is advised. The SARS-CoV-2 RNA is generally  detectable in upper and lower respiratory sp ecimens during the acute  phase of infection. The expected result is Negative. Fact Sheet for Patients:  StrictlyIdeas.no Fact Sheet for Healthcare Providers: BankingDealers.co.za This test is not yet approved or cleared by the [place] FDA and has been authorized for detection and/or diagnosis of SARS-CoV-2 by FDA under an Emergency Use Authorization (EUA).  This EUA will remain in effect (meaning this test can be used) for the duration of the COVID-19 declaration under Section 564(b)(1) of the Act, 21 U.S.C. section 360bbb-3(b)(1), unless the authorization is terminated or revoked sooner. Performed at [facility], [place] [address] [place], [place] [address]   Aerobic/Anaerobic Culture (surgical/deep wound)     Status: None (Preliminary result)   Collection Time: 11/02/18  2:11 PM   Specimen: Abscess  Result Value Ref Range Status   Specimen Description   Final    ABSCESS Performed at [facility] [address] [place], [place] [address]    Special Requests   Final    Normal Performed at [facility], [place] [address], [place], [place] [address]    Gram Stain   Final    ABUNDANT WBC PRESENT,BOTH PMN AND MONONUCLEAR FEW GRAM VARIABLE ROD RARE GRAM POSITIVE COCCI Performed at [facility], [place] [address], [place], [place] [address]    Culture   Final    RARE KLEBSIELLA PNEUMONIAE NO ANAEROBES ISOLATED; CULTURE IN PROGRESS FOR 5 DAYS    Report Status PENDING  Incomplete   Organism  ID, Bacteria KLEBSIELLA PNEUMONIAE  Final      Susceptibility   Klebsiella pneumoniae - MIC*    AMPICILLIN >=32 RESISTANT Resistant     CEFAZOLIN <=4 SENSITIVE Sensitive     CEFEPIME <=1 SENSITIVE Sensitive     CEFTAZIDIME <=1 SENSITIVE Sensitive     CEFTRIAXONE <=1 SENSITIVE Sensitive     CIPROFLOXACIN <=0.25 SENSITIVE Sensitive     GENTAMICIN <=1 SENSITIVE Sensitive     IMIPENEM <=0.25 SENSITIVE Sensitive     TRIMETH/SULFA <=20 SENSITIVE Sensitive     AMPICILLIN/SULBACTAM 4 SENSITIVE Sensitive     PIP/TAZO <=4 SENSITIVE Sensitive     Extended ESBL NEGATIVE Sensitive     * RARE KLEBSIELLA PNEUMONIAE     Labs: Basic Metabolic Panel: Recent Labs  Lab 10/31/18 0408 10/31/18 1348 11/01/18 0309 11/02/18 0254 11/03/18 0300 11/04/18 0244  NA 137  --  138 136 138 138  K 2.7* 3.1* 3.1* 3.2* 3.6 3.5  CL 97*  --  103 102 105 104  CO2 25  --  26 24 24 23   GLUCOSE 73  --  116* 106* 99 71  BUN 12  --  9 7 8 9   CREATININE 0.72  --  0.71 0.70 0.64 0.79  CALCIUM 7.6*  --  7.8* 7.8* 7.9* 7.9*  MG 2.4  --  2.4  --   --   --    CBC: Recent Labs  Lab 10/31/18 0408 11/01/18 0309 11/02/18 0254 11/03/18 0300 11/04/18 0244  WBC 14.2*  14.8* 14.1* 12.2* 9.0  HGB 12.5 10.5* 10.5* 9.9* 10.2*  HCT 40.4 33.8* 32.7* 32.2* 32.6*  MCV 87.6 88.0 86.7 87.5 86.7  PLT 401* 359 302 346 380    Principal Problem:   Diverticulitis of large intestine with perforation and abscess Active Problems:   Depression with anxiety   Relapsing remitting multiple sclerosis ([place])   Essential hypertension   Time coordinating discharge: 35 minutes  Signed:  Murray Hodgkins, MD  [facility]  11/05/2018, 5:56 PM

## 2018-11-05 NOTE — Discharge Instructions (Addendum)
Percutaneous Abscess Drain, Care After This sheet gives you information about how to care for yourself after your procedure. Your health care provider may also give you more specific instructions. If you have problems or questions, contact your health care provider. What can I expect after the procedure? After your procedure, it is common to have:  A small amount of bruising and discomfort in the area where the drainage tube (catheter) was placed.  Sleepiness and fatigue. This should go away after the medicines you were given have worn off. Follow these instructions at home: Incision care  Follow instructions from your health care provider about how to take care of your incision. Make sure you: ? Wash your hands with soap and water before you change your bandage (dressing). If soap and water are not available, use hand sanitizer. ? Change your dressing as told by your health care provider. ? Leave stitches (sutures), skin glue, or adhesive strips in place. These skin closures may need to stay in place for 2 weeks or longer. If adhesive strip edges start to loosen and curl up, you may trim the loose edges. Do not remove adhesive strips completely unless your health care provider tells you to do that.  Check your incision area every day for signs of infection. Check for: ? More redness, swelling, or pain. ? More fluid or blood. ? Warmth. ? Pus or a bad smell. ? Fluid leaking from around your catheter (instead of fluid draining through your catheter). Catheter care   Flush catheter once daily with 5 mL of normal saline, change dressing if dirty or wet.   Write down the following information every time you empty your bag: ? The date and time. ? The amount of drainage. General instructions  Rest at home for 1-2 days after your procedure. Return to your normal activities as told by your health care provider.  Do not submerge drain (no swimming, bathes, hot tubs, etc.). You may shower with  the drain as long as it is covered by a water tight dressing - you may clean around the drain with a warm washcloth and soap, do not scrub the drain insertion site.   Take over-the-counter and prescription medicines only as told by your health care provider.  Keep all follow-up visits as told by your health care provider. This is important. Contact a health care provider if:  You have less than 10 mL of drainage a day for 2-3 days in a row, or as directed by your health care provider.  You have more redness, swelling, or pain around your incision area.  You have more fluid or blood coming from your incision area.  Your incision area feels warm to the touch.  You have pus or a bad smell coming from your incision area.  You have fluid leaking from around your catheter (instead of through your catheter).  You have a fever or chills.  You have pain that does not get better with medicine. Get help right away if:  Your catheter comes out.  You suddenly stop having drainage from your catheter.  You suddenly have blood in the fluid that is draining from your catheter.  You become dizzy or you faint.  You develop a rash.  You have nausea or vomiting.  You have difficulty breathing or you feel short of breath.  You develop chest pain.  You have problems with your speech or vision.  You have trouble balancing or moving your arms or legs. Summary  It  is common to have a small amount of bruising and discomfort in the area where the drainage tube (catheter) was placed.  You may be directed to record the amount of drainage from the bag every time you empty it.  Follow instructions from your health care provider about emptying and cleaning your catheter and collection bag. This information is not intended to replace advice given to you by your health care provider. Make sure you discuss any questions you have with your health care provider. Document Released: 08/01/2013 Document  Revised: 02/27/2017 Document Reviewed: 02/07/2016 Elsevier Patient Education  [place].    Diverticulitis  Diverticulitis is when small pockets in your large intestine (colon) get infected or swollen. This causes stomach pain and watery poop (diarrhea). These pouches are called diverticula. They form in people who have a condition called diverticulosis. Follow these instructions at home: Medicines  Take over-the-counter and prescription medicines only as told by your doctor. These include: ? Antibiotics. ? Pain medicines. ? Fiber pills. ? Probiotics. ? Stool softeners.  Do not drive or use heavy machinery while taking prescription pain medicine.  If you were prescribed an antibiotic, take it as told. Do not stop taking it even if you feel better. General instructions   Follow a diet as told by your doctor.  When you feel better, your doctor may tell you to change your diet. You may need to eat a lot of fiber. Fiber makes it easier to poop (have bowel movements). Healthy foods with fiber include: ? Berries. ? Beans. ? Lentils. ? Green vegetables.  Exercise 3 or more times a week. Aim for 30 minutes each time. Exercise enough to sweat and make your heart beat faster.  Keep all follow-up visits as told. This is important. You may need to have an exam of the large intestine. This is called a colonoscopy. Contact a doctor if:  Your pain does not get better.  You have a hard time eating or drinking.  You are not pooping like normal. Get help right away if:  Your pain gets worse.  Your problems do not get better.  Your problems get worse very fast.  You have a fever.  You throw up (vomit) more than one time.  You have poop that is: ? Bloody. ? Black. ? Tarry. Summary  Diverticulitis is when small pockets in your large intestine (colon) get infected or swollen.  Take medicines only as told by your doctor.  Follow a diet as told by your doctor. This  information is not intended to replace advice given to you by your health care provider. Make sure you discuss any questions you have with your health care provider. Document Released: 09/03/2007 Document Revised: 02/27/2017 Document Reviewed: 04/03/2016 Elsevier Patient Education  2020 [employer].

## 2018-11-05 NOTE — Progress Notes (Signed)
The AVS discharge instructions were reviewed and given to the patient. She placed them in her belongings bag. She has no questions. Will D/C to her dad's car via wheelchair.   Left forearm peripheral IV was D/C'd. No documentation in the chart of the IV start. Catheter was intact.

## 2018-11-05 NOTE — Progress Notes (Addendum)
Referring Physician(s): Brigid Re, PA-C  Supervising Physician: Corrie Mckusick  Patient Status:  [facility] - In-pt  Chief Complaint: Follow up LLQ abscess drain placed 11/02/18 by Dr. Annamaria Boots  Subjective:  Patient sleeping comfortably upon entry to room, easily arouses to verbal cues. She reports soreness still at drain site which is unchanged from previous days, it is mild and manageable with pain medication. She reports improved PO intake yesterday and today.   Allergies: Septra [sulfamethoxazole-trimethoprim]  Medications: Prior to Admission medications   Medication Sig Start Date End Date Taking? Authorizing Provider  chlorthalidone (HYGROTON) 25 MG tablet Take 12.5 mg by mouth daily.   Yes [provider]  cholecalciferol (VITAMIN D3) 25 MCG (1000 UT) tablet Take 1,000 Units by mouth daily.   Yes [provider]  cyclobenzaprine (FLEXERIL) 5 MG tablet Take 1 tablet (5 mg total) by mouth at bedtime as needed. Patient taking differently: Take 5 mg by mouth at bedtime as needed for muscle spasms.  12/26/16  Yes Sater, Nanine Means, MD  escitalopram (LEXAPRO) 20 MG tablet Take 20 mg by mouth daily.  11/11/17  Yes [provider]  lamoTRIgine (LAMICTAL) 200 MG tablet Take 400 mg by mouth daily.    Yes [provider]  levonorgestrel (MIRENA) 20 MCG/24HR IUD 1 each by Intrauterine route once. 11/14/15  Yes [provider]  oxybutynin (DITROPAN) 5 MG tablet Take 1 tablet (5 mg total) by mouth 2 (two) times daily. 12/30/17  Yes Sater, Nanine Means, MD  Probiotic Product (PROBIOTIC PO) Take 1 capsule by mouth daily.   Yes [provider]  Teriflunomide (AUBAGIO) 14 MG TABS Take 1 tablet by mouth daily. 04/12/18  Yes Sater, Nanine Means, MD  gabapentin (NEURONTIN) 100 MG capsule Take 100 mg by mouth daily as needed (nerve pain).  05/19/18   [provider]  meloxicam (MOBIC) 15 MG tablet Take 1 tablet (15 mg total) by mouth daily. Patient taking  differently: Take 15 mg by mouth daily as needed.  12/26/16   Sater, Nanine Means, MD  Polyvinyl Alcohol-Povidone (REFRESH OP) Place 2 drops into both eyes 4 (four) times daily as needed (dryness).    [provider]     Vital Signs: BP 120/77 (BP Location: Left Arm)    Pulse 85    Temp 97.7 F (36.5 C) (Oral)    Resp 14    Ht 5\' 6"  (1.676 m)    Wt 230 lb (104.3 kg)    SpO2 95%    BMI 37.12 kg/m   Physical Exam Vitals signs and nursing note reviewed.  HENT:     Head: Normocephalic.  Cardiovascular:     Rate and Rhythm: Normal rate.  Pulmonary:     Effort: Pulmonary effort is normal.  Abdominal:     General: There is no distension.     Palpations: Abdomen is soft.     Tenderness: There is abdominal tenderness (mild over drain insertion site).     Comments: (+) LLQ drain to suction with scant purulent drainage present. Insertion site clean, dry, dressed appropriately. Suture and stat lock in tact.   Skin:    General: Skin is warm and dry.  Neurological:     Mental Status: She is alert. Mental status is at baseline.     Imaging: Ct Abdomen Pelvis W Contrast  Result Date: 11/01/2018 CLINICAL DATA:  Never ticket itis, known, follow-up EXAM: CT ABDOMEN AND PELVIS WITH CONTRAST TECHNIQUE: Multidetector CT imaging of the abdomen and  pelvis was performed using the standard protocol following bolus administration of intravenous contrast. CONTRAST:  164mL OMNIPAQUE IOHEXOL 300 MG/ML SOLN, 12mL OMNIPAQUE IOHEXOL 300 MG/ML SOLN COMPARISON:  October 27, 2018 FINDINGS: Lower chest: The heart is unremarkable. No hiatal hernia. The visualized portions of the lungs are clear. Hepatobiliary: The liver is normal in density without focal abnormality.The main portal vein is patent. The patient is status post cholecystectomy. No biliary ductal dilation. Pancreas: Unremarkable. No pancreatic ductal dilatation or surrounding inflammatory changes. Spleen: Normal in size without focal abnormality.  Adrenals/Urinary Tract: Again noted is a 1.5 cm hypodense lesion within the left adrenal gland. The right adrenal gland is unremarkable. The kidneys and collecting system appear normal without evidence of urinary tract calculus or hydronephrosis. Bladder is unremarkable. Stomach/Bowel: The stomach is normal in appearance. There is been interval worsening of the extensive sigmoid colonic diverticulitis with surrounding mesenteric fat stranding changes. There is new extensive loculated fluid collection which extends adjacent to the sigmoid colon and surrounds distal ileal loops within the lower right abdomen best seen on series 2, image 76 and extending inferiorly adjacent to the uterus and posterior/just deep to the right ovary, series 2, image 81. There is new bowel wall thickening involving the distal ileal loops within the right lower abdomen. Vascular/Lymphatic: There are no enlarged mesenteric, retroperitoneal, or pelvic lymph nodes. Scattered small periaortic and mesenteric lymph nodes are seen. No significant vascular findings are present. Reproductive: The uterus is unremarkable. Again noted are probable dominant follicle/ovarian cyst within both adnexa. The largest cyst 3.5 cm in the left adnexa. There is IUD seen within the uterus. As stated above there is pericolonic abscess which extends adjacent to the right uterus and just deep to the right ovary. Other: No evidence of abdominal wall mass or hernia. Musculoskeletal: No acute or significant osseous findings. IMPRESSION: Interval worsening in the sigmoid colonic diverticulitis with new extensive pericolonic abscess as described above. This surrounds the distal ileal loops causing a ileal wall thickening, and and extends adjacent to the uterus and right ovary. These results were called by telephone at the time of interpretation on 11/01/2018 at 3:02 pm to PA. Brigid Re , who verbally acknowledged these results. Electronically Signed   By: Prudencio Pair  M.D.   On: 11/01/2018 15:02   Ct Image Guided Drainage By Percutaneous Catheter  Result Date: 11/02/2018 INDICATION: Pelvic diverticular abscess EXAM: CT GUIDED DRAINAGE OF PELVIC DIVERTICULAR ABSCESS MEDICATIONS: The patient is currently admitted to the hospital and receiving intravenous antibiotics. The antibiotics were administered within an appropriate time frame prior to the initiation of the procedure. ANESTHESIA/SEDATION: 3.0 mg IV Versed 100 mcg IV Fentanyl Moderate Sedation Time:  15 MINUTES The patient was continuously monitored during the procedure by the interventional radiology nurse under my direct supervision. COMPLICATIONS: None immediate. TECHNIQUE: Informed written consent was obtained from the patient after a thorough discussion of the procedural risks, benefits and alternatives. All questions were addressed. Maximal Sterile Barrier Technique was utilized including caps, mask, sterile gowns, sterile gloves, sterile drape, hand hygiene and skin antiseptic. A timeout was performed prior to the initiation of the procedure. PROCEDURE: Previous imaging reviewed. Patient positioned supine. Noncontrast localization CT performed. The anterior pelvic complex abscess was localized and marked for a left anterior oblique approach. Under sterile conditions and local anesthesia, CT guidance was utilized to advance an 18 gauge needle into the pelvic fluid collection. Syringe aspiration yielded purulent fluid. Guidewire inserted followed by tract dilatation insert a 10 French drain.  Drain catheter position confirmed with CT. Syringe aspiration yielded 60 cc purulent fluid. Sample sent for culture. Catheter secured with a prolene suture and a sterile dressing. External suction bulb connected. No immediate complication. Patient tolerated the procedure well. FINDINGS: CT imaging confirms needle placed into the pelvic abscess for drain insertion IMPRESSION: Successful CT-guided pelvic abscess diverticular drain  insertion. Sample sent for culture. Electronically Signed   By: Jerilynn Mages.  Shick M.D.   On: 11/02/2018 15:58    Labs:  CBC: Recent Labs    11/01/18 0309 11/02/18 0254 11/03/18 0300 11/04/18 0244  WBC 14.8* 14.1* 12.2* 9.0  HGB 10.5* 10.5* 9.9* 10.2*  HCT 33.8* 32.7* 32.2* 32.6*  PLT 359 302 346 380    COAGS: Recent Labs    11/01/18 1531  INR 1.5*    BMP: Recent Labs    11/01/18 0309 11/02/18 0254 11/03/18 0300 11/04/18 0244  NA 138 136 138 138  K 3.1* 3.2* 3.6 3.5  CL 103 102 105 104  CO2 26 24 24 23   GLUCOSE 116* 106* 99 71  BUN 9 7 8 9   CALCIUM 7.8* 7.8* 7.9* 7.9*  CREATININE 0.71 0.70 0.64 0.79  GFRNONAA >60 >60 >60 >60  GFRAA >60 >60 >60 >60    LIVER FUNCTION TESTS: Recent Labs    12/30/17 0905 10/27/18 1223 10/28/18 0312  BILITOT 0.7 1.2 1.4*  AST 15 24 23   ALT 13 23 20   ALKPHOS 101 94 81  PROT 6.6 7.9 7.3  ALBUMIN 4.2 4.0 3.4*    Assessment and Plan:  43 y/o F s/p LLQ abdominal abscess drain placed 8/4. Some soreness still at drain site but appears mild, insertion site unremarkable. Per I/O 15 cc output in last 24H.   Continue current drain management while inpatient - TID flushes with 3-5 cc NS, record output once daily, dressing changes PRN. Per chart possible d/c this weekend - I have placed orders for patient to follow up with [place] in clinic in ~2 weeks for repeat CT/possible drain injection - IR scheduler will call patient with appointment date/time. She will need to flush drain QD at home with 5 cc NS, record output once daily, dressing changes PRN.   Further plans per primary team. IR will continue to follow - please call with questions or concerns.   Electronically Signed: Joaquim Nam, PA-C 11/05/2018, 10:09 AM   I spent a total of 15 Minutes at the the patient's bedside AND on the patient's hospital floor or unit, greater than 50% of which was counseling/coordinating care for LLQ abdominal abscess drain follow up.

## 2018-11-05 NOTE — Progress Notes (Signed)
[facility] Progress Note     Subjective: CC-  Tired but feeling ok today. She reports some mild pain around the abdominal drain. Denies n/v. Tolerating soft diet. She ate some eggs for breakfast. BM this morning and passing flatus. Drain with 15cc cldouy/tan fluid out last 24 hours.  Objective: Vital signs in last 24 hours: Temp:  [97.7 F (36.5 C)-98.1 F (36.7 C)] 97.7 F (36.5 C) (08/07 0419) Pulse Rate:  [85-86] 85 (08/07 0419) Resp:  [14-20] 14 (08/07 0419) BP: (120-134)/(71-77) 120/77 (08/07 0419) SpO2:  [95 %-100 %] 95 % (08/07 0419) Last BM Date: 11/04/18  Intake/Output from previous day: 08/06 0701 - 08/07 0700 In: 579.6 [P.O.:420; IV Piggyback:159.6] Out: 15 [Drains:15] Intake/Output this shift: No intake/output data recorded.  PE: Gen: Alert, NAD, pleasant Pulm:rate and effort normal Abd: Soft,mild tenderness at drain, non-distended,+BS, no peritonitis, drain with trace cloudy/tan drainage Skin: warm and dry, no rashes  Psych: A&Ox3  Lab Results:  Recent Labs    11/03/18 0300 11/04/18 0244  WBC 12.2* 9.0  HGB 9.9* 10.2*  HCT 32.2* 32.6*  PLT 346 380   BMET Recent Labs    11/03/18 0300 11/04/18 0244  NA 138 138  K 3.6 3.5  CL 105 104  CO2 24 23  GLUCOSE 99 71  BUN 8 9  CREATININE 0.64 0.79  CALCIUM 7.9* 7.9*   PT/INR No results for input(s): LABPROT, INR in the last 72 hours. CMP     Component Value Date/Time   NA 138 11/04/2018 0244   NA 141 02/05/2016 1533   K 3.5 11/04/2018 0244   CL 104 11/04/2018 0244   CO2 23 11/04/2018 0244   GLUCOSE 71 11/04/2018 0244   BUN 9 11/04/2018 0244   BUN 9 02/05/2016 1533   CREATININE 0.79 11/04/2018 0244   CALCIUM 7.9 (L) 11/04/2018 0244   PROT 7.3 10/28/2018 0312   PROT 6.6 12/30/2017 0905   ALBUMIN 3.4 (L) 10/28/2018 0312   ALBUMIN 4.2 12/30/2017 0905   AST 23 10/28/2018 0312   ALT 20 10/28/2018 0312   ALKPHOS 81 10/28/2018 0312   BILITOT 1.4 (H) 10/28/2018 0312   BILITOT 0.7 12/30/2017 0905   GFRNONAA >60 11/04/2018 0244   GFRAA >60 11/04/2018 0244   Lipase     Component Value Date/Time   LIPASE 25 10/27/2018 1223       Studies/Results: No results found.  Anti-infectives: Anti-infectives (From admission, onward)   Start     Dose/Rate Route Frequency Ordered Stop   10/28/18 0400  piperacillin-tazobactam (ZOSYN) IVPB 3.375 g     3.375 g 12.5 mL/hr over 240 Minutes Intravenous Every 8 hours 10/27/18 2122     10/28/18 0315  ceFEPIme (MAXIPIME) 2 g in sodium chloride 0.9 % 100 mL IVPB  Status:  Discontinued     2 g 200 mL/hr over 30 Minutes Intravenous Every 8 hours 10/27/18 1932 10/27/18 2056   10/28/18 0315  metroNIDAZOLE (FLAGYL) IVPB 500 mg  Status:  Discontinued     500 mg 100 mL/hr over 60 Minutes Intravenous Every 8 hours 10/27/18 1932 10/27/18 2056   10/27/18 1915  ceFEPIme (MAXIPIME) 2 g in sodium chloride 0.9 % 100 mL IVPB     2 g 200 mL/hr over 30 Minutes Intravenous  Once 10/27/18 1912 10/27/18 2030   10/27/18 1915  metroNIDAZOLE (FLAGYL) IVPB 500 mg  Status:  Discontinued     500 mg 100 mL/hr over 60 Minutes Intravenous  Once 10/27/18 1912 10/27/18 2056  Assessment/Plan Relapsing Remitting Multiple Sclerosis - on immunotherapy HTN Depression/Anxiety  Sigmoid diverticulitis with microperforation  - CT 7/29: sigmoid diverticulitis with perforation, no abscess, inflammation consistent with possible inflammatory bowel disease - repeat CT abd/pelvis08/03 showed worsening diverticulitis with new pericolonic abscess  -s/p IR drain 11/02/18- drain down to 15cc in last 24hrs -tolerating soft diet and having bowel function  ZHG:DJME diet, boost  VTE: SCDs ID: cefepime/flagyl 7/29>7/30; Zosyn 7/30>> Foley:none Follow up:Dr. [name]  Plan:Patient stable for discharge from surgical standpoint. Continue drain. Recommend 5 days augmentin at discharge. F/u with IR and CCS. Will arrange follow up in our office with  Dr. [name].   LOS: 9 days    Wellington Hampshire , [facility] 11/05/2018, 10:27 AM Pager: [phone] Mon-Thurs 7:00 am-4:30 pm Fri 7:00 am -11:30 AM Sat-Sun 7:00 am-11:30 am

## 2018-11-05 NOTE — Plan of Care (Signed)
  Problem: Activity: Goal: Risk for activity intolerance will decrease Outcome: Progressing   Problem: Pain Managment: Goal: General experience of comfort will improve Outcome: Progressing   Problem: Clinical Measurements: Goal: Ability to maintain clinical measurements within normal limits will improve Outcome: Progressing

## 2018-11-08 ENCOUNTER — Other Ambulatory Visit: Payer: Self-pay | Admitting: Surgery

## 2018-11-08 DIAGNOSIS — K5792 Diverticulitis of intestine, part unspecified, without perforation or abscess without bleeding: Secondary | ICD-10-CM

## 2018-11-10 MED FILL — NORMAL SALINE FLUSH SYRINGE: 0.9 | 10 days supply | Qty: 100 | Fill #0

## 2018-11-16 ENCOUNTER — Ambulatory Visit
Admission: RE | Admit: 2018-11-16 | Discharge: 2018-11-16 | Disposition: A | Discharge status: Home / Self Care | Payer: BC Managed Care – PPO | Source: Ambulatory Visit | Attending: Physician Assistant | Admitting: Physician Assistant

## 2018-11-16 ENCOUNTER — Ambulatory Visit
Admission: RE | Admit: 2018-11-16 | Discharge: 2018-11-16 | Disposition: A | Discharge status: Home / Self Care | Payer: BC Managed Care – PPO | Source: Ambulatory Visit | Attending: Surgery | Admitting: Surgery

## 2018-11-16 ENCOUNTER — Encounter: Payer: Self-pay | Admitting: Radiology

## 2018-11-16 DIAGNOSIS — K5792 Diverticulitis of intestine, part unspecified, without perforation or abscess without bleeding: Secondary | ICD-10-CM

## 2018-11-16 HISTORY — PX: IR RADIOLOGIST EVAL & MGMT: IMG5224

## 2018-11-16 MED ORDER — IOPAMIDOL (ISOVUE-300) INJECTION 61%
100.0000 mL | Freq: Once | INTRAVENOUS | Status: AC | PRN
  Administered 2018-11-16: 100 mL via INTRAVENOUS

## 2018-11-16 NOTE — Progress Notes (Signed)
Patient ID: Sandra Bonilla, female   DOB: 1975-07-17, 43 y.o.   MRN: [id number]       Chief Complaint: Patient was seen in consultation today for drain follow-up at the request of Sloan A  Referring Physician(s): [name]  History of Present Illness: Sandra Bonilla is a 43 y.o. female with history of diverticulitis and diverticular abscess.  Patient was discharged from the hospital with antibiotics and a percutaneous drain.  Patient is feeling much better since she has been discharged to the hospital.  She completed her antibiotic therapy and continues to flush the drain once a day.  She reports minimal output from the drain.  She denies fevers, chills or abdominal pain.  She is tolerating a normal diet and having normal bowel movements.  Patient was evaluated by Dr. [name] in Surgery yesterday.  Past Medical History:  Diagnosis Date   Cervical high risk human papillomavirus (HPV) DNA test positive 09/2015   Cholecystitis 2006   GALBLADDER REMOVED   Fibroadenoma 2016   RIGHT BREAST   History of mammogram 11/2014   FIBROADENOMA   History of Papanicolaou smear of cervix 01/24/13; 10/15/15   -/-; -/+   Hypertension    Menorrhagia 02/01/2010   D/C HYSTEROSCOPY ENDO POLYP   MS (multiple sclerosis) ([place])    Ovarian cyst 9/15; 2015-2016   LEFT - WENT TO [facility]; RIGHT    Past Surgical History:  Procedure Laterality Date   BREAST BIOPSY Right 2016   Fibroadenoma   CHOLECYSTECTOMY  2006   DILATION AND CURETTAGE, DIAGNOSTIC / THERAPEUTIC  02/01/2010   D/C HYSTEROSCOPY ENDO POLYP; [facility]   FLEXIBLE SIGMOIDOSCOPY  07/31/2016   Procedure: FLEXIBLE SIGMOIDOSCOPY;  Surgeon: [name], MD;  Location: [facility];  Service: Endoscopy;;   HYSTEROSCOPY  02/01/2010   ENDO POLYP - [facility]   IR RADIOLOGIST EVAL & MGMT  11/16/2018    Allergies: Septra [sulfamethoxazole-trimethoprim]  Medications: Prior to Admission medications   Medication Sig Start Date End  Date Taking? Authorizing Provider  acetaminophen (TYLENOL) 325 MG tablet Take 2 tablets (650 mg total) by mouth every 6 (six) hours as needed for mild pain (or Fever >/= 101). 11/05/18   Samuella Cota, MD  chlorthalidone (HYGROTON) 25 MG tablet Take 12.5 mg by mouth daily.    [provider]  cholecalciferol (VITAMIN D3) 25 MCG (1000 UT) tablet Take 1,000 Units by mouth daily.    [provider]  cyclobenzaprine (FLEXERIL) 5 MG tablet Take 1 tablet (5 mg total) by mouth at bedtime as needed. Patient taking differently: Take 5 mg by mouth at bedtime as needed for muscle spasms.  12/26/16   Sater, Nanine Means, MD  escitalopram (LEXAPRO) 20 MG tablet Take 20 mg by mouth daily.  11/11/17   [provider]  gabapentin (NEURONTIN) 100 MG capsule Take 100 mg by mouth daily as needed (nerve pain).  05/19/18   [provider]  lamoTRIgine (LAMICTAL) 200 MG tablet Take 400 mg by mouth daily.     [provider]  levonorgestrel (MIRENA) 20 MCG/24HR IUD 1 each by Intrauterine route once. 11/14/15   [provider]  meloxicam (MOBIC) 15 MG tablet Take 1 tablet (15 mg total) by mouth daily. Patient taking differently: Take 15 mg by mouth daily as needed.  12/26/16   Sater, Nanine Means, MD  oxybutynin (DITROPAN) 5 MG tablet Take 1 tablet (5 mg total) by mouth 2 (two) times daily. 12/30/17   Sater, Nanine Means, MD  Polyvinyl Alcohol-Povidone (  REFRESH OP) Place 2 drops into both eyes 4 (four) times daily as needed (dryness).    [provider]  Probiotic Product (PROBIOTIC PO) Take 1 capsule by mouth daily.    [provider]  Teriflunomide (AUBAGIO) 14 MG TABS Take 1 tablet by mouth daily. 04/12/18   Sater, Nanine Means, MD     Family History  Problem Relation Age of Onset   Breast cancer Maternal Aunt 94   Rheum arthritis Mother    Hypertension Mother    Thyroid disease Mother        HYPOTHYROIDISM   Cancer Mother 53       LUNG   Heart  disease Father    Diabetes Father    Hypertension Father    Cancer Paternal Grandmother        MELANOMA OF SKIN   Cancer Cousin        KIDNEY-COUSIN    Social History   Socioeconomic History   Marital status: Divorced    Spouse name: Not on file   Number of children: 0   Years of education: 14   Highest education level: Not on file  Occupational History    Employer: [employer]  Social Needs   Financial resource strain: Not on file   Food insecurity    Worry: Not on file    Inability: Not on file   Transportation needs    Medical: Not on file    Non-medical: Not on file  Tobacco Use   Smoking status: Former Smoker   Smokeless tobacco: Never Used  Substance and Sexual Activity   Alcohol use: No   Drug use: No   Sexual activity: Not Currently    Birth control/protection: I.U.D.    Comment: Mirena  Lifestyle   Physical activity    Days per week: Not on file    Minutes per session: Not on file   Stress: Not on file  Relationships   Social connections    Talks on phone: Not on file    Gets together: Not on file    Attends religious service: Not on file    Active member of club or organization: Not on file    Attends meetings of clubs or organizations: Not on file    Relationship status: Not on file  Other Topics Concern   Not on file  Social History Narrative   Not on file     Review of Systems  Constitutional: Negative.  Negative for chills and fever.  Gastrointestinal: Negative for abdominal distention.  Genitourinary: Negative.     Vital Signs: There were no vitals taken for this visit.  Physical Exam Constitutional:      General: She is not in acute distress. Abdominal:     General: Abdomen is flat.     Palpations: Abdomen is soft.     Comments: Lower abdominal drain is intact.  No erythema or drainage around the drainage catheter.  Minimal cloudy fluid within the suction bulb.         Imaging: Ct Abdomen Pelvis W  Contrast  Result Date: 11/16/2018 CLINICAL DATA:  43 year old with a diverticular abscess. CT-guided drain was placed on 07/03/2018. Patient presents for drain follow-up. EXAM: CT ABDOMEN AND PELVIS WITH CONTRAST TECHNIQUE: Multidetector CT imaging of the abdomen and pelvis was performed using the standard protocol following bolus administration of intravenous contrast. CONTRAST:  16mL ISOVUE-300 IOPAMIDOL (ISOVUE-300) INJECTION 61% COMPARISON:  11/01/2018 FINDINGS: Lower chest: Lung bases are clear.  No pleural effusions. Hepatobiliary:  Cholecystectomy. Normal appearance of the liver without a suspicious lesion. The main portal venous system is patent. No biliary dilatation. Pancreas: Unremarkable. No pancreatic ductal dilatation or surrounding inflammatory changes. Spleen: Normal in size without focal abnormality. Adrenals/Urinary Tract: Stable 2 cm low-density nodule involving the left adrenal gland. This is most compatible with benign adenoma based on the noncontrast CT from 04/21/2016. Normal appearance of the right adrenal gland. Normal appearance of both kidneys without hydronephrosis. Normal appearance of the urinary bladder. Stomach/Bowel: There continues to be a small amount of wall thickening involving the sigmoid colon but the inflammatory changes in this area have decreased. Diverticula involving the left colon. Normal appearance of the appendix and terminal ileum. Small bowel loops are decompressed. Normal appearance of the stomach and duodenum. Vascular/Lymphatic: Main visceral arteries are patent. Normal caliber of the abdominal aorta. Venous structures are unremarkable. No abdominopelvic lymphadenopathy. Reproductive: Uterus appears to be mildly retroverted or retroflexed. There continues to be low-density collections in the adnexa region with some peripheral enhancement. These adnexal fluid collections have decreased in size. Left adnexal collection on sequence 2, image 80 roughly measures 3.0 x  2.7 cm and previously measured 3.5 x 3.0 cm. Teardrop shaped fluid collection along the posterior right adnexa on image 77 measures 3.3 x 1.9 cm and previously measured 4.1 x 2.1 cm. Questionable heterogeneous collection versus right ovary on image 75 measures up to 2.8 cm. Other: Percutaneous drain in the anterior lower abdomen. The large abscess in the anterior abdomen has resolved. Minimal mesenteric edema around the catheter. No free fluid in the pelvis. Negative for free air. Areas of soft tissue thickening and mild edema between small bowel loops in the lower abdomen near the drain site. Findings compatible with residual inflammatory changes. Musculoskeletal: Severe disc space loss with extensive endplate disease at K0-U5. No acute bone abnormality. IMPRESSION: 1. Resolution of the large abscess collection in the anterior lower abdomen since placement of the percutaneous drain. No residual abscess around the drain. 2. Residual small fluid collections in the bilateral adnexa regions. These fluid collections have decreased in size with peripheral enhancement but no gas. These small collections could represent small abscess or inflammatory collections. These collections are likely too small for percutaneous drainage. Consider follow-up imaging to ensure resolution. 3. Residual mild inflammatory changes around the distal sigmoid colon and loops of small bowel in the lower abdomen. Electronically Signed   By: Markus Daft M.D.   On: 11/16/2018 14:25   Ct Abdomen Pelvis W Contrast  Result Date: 11/01/2018 CLINICAL DATA:  Never ticket itis, known, follow-up EXAM: CT ABDOMEN AND PELVIS WITH CONTRAST TECHNIQUE: Multidetector CT imaging of the abdomen and pelvis was performed using the standard protocol following bolus administration of intravenous contrast. CONTRAST:  121mL OMNIPAQUE IOHEXOL 300 MG/ML SOLN, 71mL OMNIPAQUE IOHEXOL 300 MG/ML SOLN COMPARISON:  October 27, 2018 FINDINGS: Lower chest: The heart is  unremarkable. No hiatal hernia. The visualized portions of the lungs are clear. Hepatobiliary: The liver is normal in density without focal abnormality.The main portal vein is patent. The patient is status post cholecystectomy. No biliary ductal dilation. Pancreas: Unremarkable. No pancreatic ductal dilatation or surrounding inflammatory changes. Spleen: Normal in size without focal abnormality. Adrenals/Urinary Tract: Again noted is a 1.5 cm hypodense lesion within the left adrenal gland. The right adrenal gland is unremarkable. The kidneys and collecting system appear normal without evidence of urinary tract calculus or hydronephrosis. Bladder is unremarkable. Stomach/Bowel: The stomach is normal in appearance. There is been interval worsening  of the extensive sigmoid colonic diverticulitis with surrounding mesenteric fat stranding changes. There is new extensive loculated fluid collection which extends adjacent to the sigmoid colon and surrounds distal ileal loops within the lower right abdomen best seen on series 2, image 76 and extending inferiorly adjacent to the uterus and posterior/just deep to the right ovary, series 2, image 81. There is new bowel wall thickening involving the distal ileal loops within the right lower abdomen. Vascular/Lymphatic: There are no enlarged mesenteric, retroperitoneal, or pelvic lymph nodes. Scattered small periaortic and mesenteric lymph nodes are seen. No significant vascular findings are present. Reproductive: The uterus is unremarkable. Again noted are probable dominant follicle/ovarian cyst within both adnexa. The largest cyst 3.5 cm in the left adnexa. There is IUD seen within the uterus. As stated above there is pericolonic abscess which extends adjacent to the right uterus and just deep to the right ovary. Other: No evidence of abdominal wall mass or hernia. Musculoskeletal: No acute or significant osseous findings. IMPRESSION: Interval worsening in the sigmoid colonic  diverticulitis with new extensive pericolonic abscess as described above. This surrounds the distal ileal loops causing a ileal wall thickening, and and extends adjacent to the uterus and right ovary. These results were called by telephone at the time of interpretation on 11/01/2018 at 3:02 pm to PA. Brigid Re , who verbally acknowledged these results. Electronically Signed   By: Prudencio Pair M.D.   On: 11/01/2018 15:02   Ct Abdomen Pelvis W Contrast  Result Date: 10/27/2018 CLINICAL DATA:  Severe lower abdominal pain, nausea, vomiting EXAM: CT ABDOMEN AND PELVIS WITH CONTRAST TECHNIQUE: Multidetector CT imaging of the abdomen and pelvis was performed using the standard protocol following bolus administration of intravenous contrast. CONTRAST:  147mL OMNIPAQUE IOHEXOL 300 MG/ML  SOLN COMPARISON:  04/21/2016 FINDINGS: Lower chest: No acute abnormality. Hepatobiliary: No focal liver abnormality is seen. Status post cholecystectomy. No biliary dilatation. Pancreas: Unremarkable. No pancreatic ductal dilatation or surrounding inflammatory changes. Spleen: Normal in size without significant abnormality. Adrenals/Urinary Tract: Adrenal glands are unremarkable. Kidneys are normal, without renal calculi, solid lesion, or hydronephrosis. Bladder is unremarkable. Stomach/Bowel: Stomach is within normal limits. Appendix appears normal. There is extensive fatty mural stratification of the ileum and the majority of the colon, with a very tethered appearance of the distal small bowel loops in the right lower quadrant (series 2, image 67). There is sigmoid diverticulosis with extensive thickening and fat stranding about the mid sigmoid. There is a tiny focus of extraluminal air anterior to the mid sigmoid (series 2, image 76). There are additional foci of air in the left upper quadrant mesentery (series 2, image 22, 29). No evidence of abscess. Vascular/Lymphatic: No significant vascular findings are present. No enlarged  abdominal or pelvic lymph nodes. Reproductive: IUD is present in the endometrial cavity. Fluid attenuation lesions of the bilateral ovaries consistent with functional cysts or follicles. Other: No abdominal wall hernia or abnormality. Trace ascites in the low abdomen and pelvis. Musculoskeletal: No acute or significant osseous findings. IMPRESSION: 1. There is sigmoid diverticulosis with extensive thickening and fat stranding about the mid sigmoid. There is a tiny focus of extraluminal air anterior to the mid sigmoid (series 2, image 76). Findings are consistent with acute diverticulitis complicated by perforation. There are additional foci of air in the left upper quadrant mesentery (series 2, image 22, 29), which is an unusual location for abdominal air however there is no overt pneumoperitoneum about the liver or diaphragms. No evidence of abscess.  2. There is extensive fatty mural stratification of the ileum and the majority of the colon, with a very tethered appearance of the distal small bowel loops in the right lower quadrant (series 2, image 67). There is sigmoid diverticulosis with extensive thickening and fat stranding about the mid sigmoid. Findings are generally consistent with inflammatory bowel disease, particularly Crohn's disease, without particular findings to suggest acute inflammation. 3. Trace ascites in the low abdomen and pelvis. Electronically Signed   By: Eddie Candle M.D.   On: 10/27/2018 19:05   Ct Image Guided Drainage By Percutaneous Catheter  Result Date: 11/02/2018 INDICATION: Pelvic diverticular abscess EXAM: CT GUIDED DRAINAGE OF PELVIC DIVERTICULAR ABSCESS MEDICATIONS: The patient is currently admitted to the hospital and receiving intravenous antibiotics. The antibiotics were administered within an appropriate time frame prior to the initiation of the procedure. ANESTHESIA/SEDATION: 3.0 mg IV Versed 100 mcg IV Fentanyl Moderate Sedation Time:  15 MINUTES The patient was  continuously monitored during the procedure by the interventional radiology nurse under my direct supervision. COMPLICATIONS: None immediate. TECHNIQUE: Informed written consent was obtained from the patient after a thorough discussion of the procedural risks, benefits and alternatives. All questions were addressed. Maximal Sterile Barrier Technique was utilized including caps, mask, sterile gowns, sterile gloves, sterile drape, hand hygiene and skin antiseptic. A timeout was performed prior to the initiation of the procedure. PROCEDURE: Previous imaging reviewed. Patient positioned supine. Noncontrast localization CT performed. The anterior pelvic complex abscess was localized and marked for a left anterior oblique approach. Under sterile conditions and local anesthesia, CT guidance was utilized to advance an 18 gauge needle into the pelvic fluid collection. Syringe aspiration yielded purulent fluid. Guidewire inserted followed by tract dilatation insert a 10 French drain. Drain catheter position confirmed with CT. Syringe aspiration yielded 60 cc purulent fluid. Sample sent for culture. Catheter secured with a prolene suture and a sterile dressing. External suction bulb connected. No immediate complication. Patient tolerated the procedure well. FINDINGS: CT imaging confirms needle placed into the pelvic abscess for drain insertion IMPRESSION: Successful CT-guided pelvic abscess diverticular drain insertion. Sample sent for culture. Electronically Signed   By: Jerilynn Mages.  Shick M.D.   On: 11/02/2018 15:58   Ir Radiologist Eval & Mgmt  Result Date: 11/16/2018 Please refer to notes tab for details about interventional procedure. (Op Note)   Labs:  CBC: Recent Labs    11/01/18 0309 11/02/18 0254 11/03/18 0300 11/04/18 0244  WBC 14.8* 14.1* 12.2* 9.0  HGB 10.5* 10.5* 9.9* 10.2*  HCT 33.8* 32.7* 32.2* 32.6*  PLT 359 302 346 380    COAGS: Recent Labs    11/01/18 1531  INR 1.5*    BMP: Recent Labs     11/01/18 0309 11/02/18 0254 11/03/18 0300 11/04/18 0244  NA 138 136 138 138  K 3.1* 3.2* 3.6 3.5  CL 103 102 105 104  CO2 26 24 24 23   GLUCOSE 116* 106* 99 71  BUN 9 7 8 9   CALCIUM 7.8* 7.8* 7.9* 7.9*  CREATININE 0.71 0.70 0.64 0.79  GFRNONAA >60 >60 >60 >60  GFRAA >60 >60 >60 >60    LIVER FUNCTION TESTS: Recent Labs    12/30/17 0905 10/27/18 1223 10/28/18 0312  BILITOT 0.7 1.2 1.4*  AST 15 24 23   ALT 13 23 20   ALKPHOS 101 94 81  PROT 6.6 7.9 7.3  ALBUMIN 4.2 4.0 3.4*    TUMOR MARKERS: No results for input(s): AFPTM, CEA, CA199, CHROMGRNA in the last 8760 hours.  Assessment and Plan:  43 year old with history of diverticulitis and diverticular abscess.  The large diverticular abscess has resolved following drain placement.  No fistula connection to bowel on the drain injection.  CT from today demonstrates small residual fluid collections around the uterus and adnexa but these are decreasing in size.  Based on the CT and drain injection findings, the drain was removed.  Patient will follow-up with General Surgery.    Thank you for this interesting consult.  I greatly enjoyed meeting Sandra Bonilla and look forward to participating in their care.  A copy of this report was sent to the requesting provider on this date.  Electronically Signed: Burman Riis 11/16/2018, 4:09 PM   I spent a total of    10 Minutes in face to face in clinical consultation, greater than 50% of which was counseling/coordinating care for drain care.

## 2018-11-17 ENCOUNTER — Encounter: Payer: Self-pay | Admitting: Gastroenterology

## 2018-11-17 ENCOUNTER — Telehealth: Payer: Self-pay

## 2018-11-17 LAB — SUSCEPTIBILITY RESULT

## 2018-11-17 LAB — SUSCEPTIBILITY, AER + ANAEROB

## 2018-11-17 NOTE — Telephone Encounter (Signed)
Spoke w/pt. Advised bleeding is likely due to recent body stressors. Given protocol of monitoring bleeding. Would need to report to ER if completely soaking regular size pad & having to change more than 1x per hour. Pt states she is no longer on any antibiotics and they pulled her drain tube yesterday. Advised bleeding should regulate itself. Will send message to [facility] for review.

## 2018-11-17 NOTE — Telephone Encounter (Signed)
Patient has had a mirena for years. She has never had any issues with having a period. She has had a very heavy period with clots for the past 1.5 weeks. She was in the hospital for an abscess in her abdomen. She isn't certain if there was a connection between the two. Inquiring if needs to be seen. Cb#[phone]

## 2018-11-17 NOTE — Telephone Encounter (Signed)
Ok. Sounds good. Annual due 9/20.

## 2018-11-21 ENCOUNTER — Ambulatory Visit (INDEPENDENT_AMBULATORY_CARE_PROVIDER_SITE_OTHER)
Admission: EM | Admit: 2018-11-21 | Discharge: 2018-11-21 | Disposition: A | Discharge status: Home / Self Care | Payer: BC Managed Care – PPO | Source: Home / Self Care | Attending: Family Medicine | Admitting: Family Medicine

## 2018-11-21 ENCOUNTER — Emergency Department (HOSPITAL_COMMUNITY)
Admission: EM | Admit: 2018-11-21 | Discharge: 2018-11-21 | Disposition: A | Discharge status: Home / Self Care | Payer: BC Managed Care – PPO | Attending: Emergency Medicine | Admitting: Emergency Medicine

## 2018-11-21 ENCOUNTER — Encounter (HOSPITAL_COMMUNITY): Payer: Self-pay | Admitting: Emergency Medicine

## 2018-11-21 ENCOUNTER — Other Ambulatory Visit: Payer: Self-pay

## 2018-11-21 DIAGNOSIS — N939 Abnormal uterine and vaginal bleeding, unspecified: Secondary | ICD-10-CM

## 2018-11-21 DIAGNOSIS — Z87891 Personal history of nicotine dependence: Secondary | ICD-10-CM | POA: Insufficient documentation

## 2018-11-21 DIAGNOSIS — I10 Essential (primary) hypertension: Secondary | ICD-10-CM | POA: Diagnosis not present

## 2018-11-21 DIAGNOSIS — M549 Dorsalgia, unspecified: Secondary | ICD-10-CM | POA: Diagnosis not present

## 2018-11-21 DIAGNOSIS — R42 Dizziness and giddiness: Secondary | ICD-10-CM | POA: Diagnosis not present

## 2018-11-21 DIAGNOSIS — Z975 Presence of (intrauterine) contraceptive device: Secondary | ICD-10-CM | POA: Insufficient documentation

## 2018-11-21 DIAGNOSIS — Z79899 Other long term (current) drug therapy: Secondary | ICD-10-CM | POA: Diagnosis not present

## 2018-11-21 DIAGNOSIS — R000 Tachycardia, unspecified: Secondary | ICD-10-CM

## 2018-11-21 DIAGNOSIS — Z9889 Other specified postprocedural states: Secondary | ICD-10-CM | POA: Diagnosis not present

## 2018-11-21 LAB — URINALYSIS, ROUTINE W REFLEX MICROSCOPIC
Bilirubin Urine: NEGATIVE
Glucose, UA: NEGATIVE mg/dL
Ketones, ur: NEGATIVE mg/dL
Leukocytes,Ua: NEGATIVE
Nitrite: NEGATIVE
Protein, ur: 30 mg/dL — AB
RBC / HPF: >50 RBC/hpf — ABNORMAL HIGH (ref 0–5)
Specific Gravity, Urine: 1.026 (ref 1.005–1.030)
pH: 5 (ref 5.0–8.0)

## 2018-11-21 LAB — COMPREHENSIVE METABOLIC PANEL
ALT: 21 U/L (ref 0–44)
AST: 24 U/L (ref 15–41)
Albumin: 3.3 g/dL — ABNORMAL LOW (ref 3.5–5.0)
Alkaline Phosphatase: 104 U/L (ref 38–126)
Anion gap: 17 — ABNORMAL HIGH (ref 5–15)
BUN: 10 mg/dL (ref 6–20)
CO2: 24 mmol/L (ref 22–32)
Calcium: 8.8 mg/dL — ABNORMAL LOW (ref 8.9–10.3)
Chloride: 96 mmol/L — ABNORMAL LOW (ref 98–111)
Creatinine, Ser: 0.97 mg/dL (ref 0.44–1.00)
GFR calc Af Amer: >60 mL/min (ref >60)
GFR calc non Af Amer: >60 mL/min (ref >60)
Glucose, Bld: 97 mg/dL (ref 70–99)
Potassium: 3.5 mmol/L (ref 3.5–5.1)
Sodium: 137 mmol/L (ref 135–145)
Total Bilirubin: 1.1 mg/dL (ref 0.3–1.2)
Total Protein: 7.4 g/dL (ref 6.5–8.1)

## 2018-11-21 LAB — CBC WITH DIFFERENTIAL/PLATELET
Abs Immature Granulocytes: 0.07 10*3/uL (ref 0.00–0.07)
Basophils Absolute: 0.1 10*3/uL (ref 0.0–0.1)
Basophils Relative: 1 %
Eosinophils Absolute: 0.1 10*3/uL (ref 0.0–0.5)
Eosinophils Relative: 1 %
HCT: 34.6 % — ABNORMAL LOW (ref 36.0–46.0)
Hemoglobin: 10.4 g/dL — ABNORMAL LOW (ref 12.0–15.0)
Immature Granulocytes: 1 %
Lymphocytes Relative: 17 %
Lymphs Abs: 1.7 10*3/uL (ref 0.7–4.0)
MCH: 26.5 pg (ref 26.0–34.0)
MCHC: 30.1 g/dL (ref 30.0–36.0)
MCV: 88.3 fL (ref 80.0–100.0)
Monocytes Absolute: 1.5 10*3/uL — ABNORMAL HIGH (ref 0.1–1.0)
Monocytes Relative: 15 %
Neutro Abs: 6.3 10*3/uL (ref 1.7–7.7)
Neutrophils Relative %: 65 %
Platelets: 583 10*3/uL — ABNORMAL HIGH (ref 150–400)
RBC: 3.92 MIL/uL (ref 3.87–5.11)
RDW: 15.3 % (ref 11.5–15.5)
WBC: 9.9 10*3/uL (ref 4.0–10.5)
nRBC: 0 % (ref 0.0–0.2)

## 2018-11-21 LAB — I-STAT BETA HCG BLOOD, ED (MC, WL, AP ONLY): I-stat hCG, quantitative: <5 m[IU]/mL (ref <5)

## 2018-11-21 MED ORDER — SODIUM CHLORIDE 0.9 % IV BOLUS
1000.0000 mL | Freq: Once | INTRAVENOUS | Status: AC
  Administered 2018-11-21: 17:00:00 1000 mL via INTRAVENOUS

## 2018-11-21 NOTE — Discharge Instructions (Signed)
° ° °  43 year old female with 4-day history of dizziness, right-sided back pain.  She was recently seen at the ED for diverticulitis requiring drain placement for abdominal abscess.  Drain was removed last week.  She has had 2 weeks of vaginal bleeding.  She appears to be pale, and tachycardic from 120s to 130s.  Given bleeding, paleness, dizziness, patient discharged in stable condition to the emergency department for further evaluation and management needed.

## 2018-11-21 NOTE — ED Notes (Signed)
Pelvic cart at bedside. 

## 2018-11-21 NOTE — ED Triage Notes (Signed)
Pt sent here from [facility] to be evaluated for tachycardia and heavy menstrual periods.  Pt appears pale in triage.  Pt normally does not have a menstrual period due to IUD insertion.

## 2018-11-21 NOTE — ED Provider Notes (Addendum)
43 year old female with history of MS, hypertension, diverticulitis comes in for 4-day history of dizziness and back pain.  Patient was seen at the ED 10/27/2018 for diverticulitis.  She had a percutaneous catheter placed due to abdominal abscess.  Drain was removed 11/16/2018, with repeat CT scan showing resolution of the large abscess collection.  Residual small fluid collections in bilateral adnexal regions as well as residual mild inflammatory changes was still noted.  Patient is also noted to have a 2-week history of vaginal bleeding.  She has been in contact with GYN in terms of breathing, and was told probably stress related.  However, she has felt more dizzy, weak, fatigued since bleeding.  During triage, patient with BP of 123/89.  She is tachycardic ranging from 120s to 130s.  Conjunctiva and skin noted.  Given history and vitals, patient discharged in stable condition to the emergency department for further evaluation and management needed.   Patient expresses understanding and agrees to plan.  Ok Edwards, PA-C 11/21/18 [place], Amy V, PA-C 11/21/18 1553

## 2018-11-21 NOTE — Discharge Instructions (Addendum)
It was my pleasure taking care of you today!   Call your GYN doctor in the morning to schedule a follow up appointment.   Return to ER for new or worsening symptoms, any additional concerns.

## 2018-11-21 NOTE — ED Provider Notes (Signed)
[facility] EMERGENCY DEPARTMENT Provider Note   CSN: [id number] Arrival date & time: 11/21/18  1558     History   Chief Complaint Chief Complaint  Patient presents with  . Dizziness  . Vaginal Bleeding  . Back Pain    HPI Sandra Bonilla is a 43 y.o. female.     The history is provided by medical records and the patient. No language interpreter was used.  Dizziness Vaginal Bleeding Associated symptoms: back pain and dizziness   Back Pain  Sandra Bonilla is a 43 y.o. female who presents to the Emergency Department complaining of vaginal bleeding for the last 2 to 3 weeks.  She reports going through a pad about every 2-3 hours.  For the last couple of days, she has felt more weak and fatigued.  Gets dizzy when moving from sitting to standing.  She reports this happens even if she gets up very slowly.  She was admitted on 7/29 for diverticulitis and had a percutaneous catheter due to abscess.  Drain was removed on 8/18.  She had a repeat scan which showed resolution of the abscess and at her postop visit, she was told that everything was healing well.  She is not having any abdominal pain, nausea or vomiting.  She has some mild right-sided lower back pain.  Denies any urinary symptoms.  No vaginal discharge.  She has an IUD in place, therefore is not used to having vaginal bleeding.  She states that she did call her GYN who felt that the bleeding was likely a stress reaction given recent admission as stated above.  She went to the urgent care earlier today where it was noted that she was tachycardic and pale.  They referred her to the ER for further work-up.   Past Medical History:  Diagnosis Date  . Cervical high risk human papillomavirus (HPV) DNA test positive 09/2015  . Cholecystitis 2006   GALBLADDER REMOVED  . 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 History of mammogram 11/2014   FIBROADENOMA  . History of Papanicolaou smear of cervix 01/24/13; 10/15/15    -/-; -/+  . Hypertension   . Menorrhagia 02/01/2010   D/C HYSTEROSCOPY ENDO POLYP  . MS (multiple sclerosis) ([place])   . Ovarian cyst 9/15; 2015-2016   LEFT - WENT TO [facility]; RIGHT    Patient Active Problem List   Diagnosis Date Noted  . Urinary dysfunction 12/30/2017  . Varicose veins of bilateral lower extremities with pain 10/28/2017  . Low grade squamous intraepithelial lesion (LGSIL) on cervical Pap smear 10/29/2016  . 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 Head injury 06/25/2016  . Essential hypertension 04/22/2016  . Diverticulitis of large intestine with perforation and abscess   . Multiple sclerosis ([place])   . Diverticulitis 04/21/2016  . Other headache syndrome 02/05/2016  . Vitamin D deficiency 10/09/2015  . Other fatigue 10/09/2015  . Dysesthesia 04/11/2015  . Depression with anxiety 04/11/2015  . Gait disturbance 12/13/2014  . Insomnia 12/13/2014  . Multiple sclerosis, relapsing-remitting ([place]) 08/30/2012  . Relapsing remitting multiple sclerosis ([place]) 08/30/2012    Past Surgical History:  Procedure Laterality Date  . BREAST BIOPSY Right 2016   Fibroadenoma  . CHOLECYSTECTOMY  2006  . DILATION AND CURETTAGE, DIAGNOSTIC / THERAPEUTIC  02/01/2010   D/C HYSTEROSCOPY ENDO POLYP; [facility]  . FLEXIBLE SIGMOIDOSCOPY  07/31/2016   Procedure: FLEXIBLE SIGMOIDOSCOPY;  Surgeon: [name], MD;  Location: [facility] ENDOSCOPY;  Service:  Endoscopy;;  . HYSTEROSCOPY  02/01/2010   ENDO POLYP - [facility]  . IR RADIOLOGIST EVAL & MGMT  11/16/2018     OB History    Gravida  0   Para  0   Term  0   Preterm  0   AB  0   Living  0     SAB  0   TAB  0   Ectopic  0   Multiple  0   Live Births  0            Home Medications    Prior to Admission medications   Medication Sig Start Date End Date Taking? Authorizing Provider  acetaminophen (TYLENOL) 325 MG tablet Take 2 tablets (650 mg total) by mouth every 6 (six) hours as needed for  mild pain (or Fever >/= 101). 11/05/18   Samuella Cota, MD  chlorthalidone (HYGROTON) 25 MG tablet Take 12.5 mg by mouth daily.    [provider]  cholecalciferol (VITAMIN D3) 25 MCG (1000 UT) tablet Take 1,000 Units by mouth daily.    [provider]  cyclobenzaprine (FLEXERIL) 5 MG tablet Take 1 tablet (5 mg total) by mouth at bedtime as needed. Patient taking differently: Take 5 mg by mouth at bedtime as needed for muscle spasms.  12/26/16   Sater, Nanine Means, MD  escitalopram (LEXAPRO) 20 MG tablet Take 20 mg by mouth daily.  11/11/17   [provider]  gabapentin (NEURONTIN) 100 MG capsule Take 100 mg by mouth daily as needed (nerve pain).  05/19/18   [provider]  lamoTRIgine (LAMICTAL) 200 MG tablet Take 400 mg by mouth daily.     [provider]  levonorgestrel (MIRENA) 20 MCG/24HR IUD 1 each by Intrauterine route once. 11/14/15   [provider]  meloxicam (MOBIC) 15 MG tablet Take 1 tablet (15 mg total) by mouth daily. Patient taking differently: Take 15 mg by mouth daily as needed.  12/26/16   Sater, Nanine Means, MD  oxybutynin (DITROPAN) 5 MG tablet Take 1 tablet (5 mg total) by mouth 2 (two) times daily. 12/30/17   Sater, Nanine Means, MD  Polyvinyl Alcohol-Povidone (REFRESH OP) Place 2 drops into both eyes 4 (four) times daily as needed (dryness).    [provider]  Probiotic Product (PROBIOTIC PO) Take 1 capsule by mouth daily.    [provider]  Teriflunomide (AUBAGIO) 14 MG TABS Take 1 tablet by mouth daily. 04/12/18   Sater, Nanine Means, MD    Family History Family History  Problem Relation Age of Onset  . Breast cancer Maternal Aunt 69  . Rheum arthritis Mother   . Hypertension Mother   . Thyroid disease Mother        HYPOTHYROIDISM  . Cancer Mother 22       LUNG  . Heart disease Father   . Diabetes Father   . Hypertension Father   . Cancer Paternal Grandmother        MELANOMA OF SKIN  . Cancer Cousin         KIDNEY-COUSIN    Social History Social History   Tobacco Use  . Smoking status: Former Research scientist (life sciences)  . Smokeless tobacco: Never Used  Substance Use Topics  . Alcohol use: No  . Drug use: No     Allergies   Septra [sulfamethoxazole-trimethoprim]   Review of Systems Review of Systems  Genitourinary: Positive for vaginal bleeding.  Musculoskeletal: Positive for back pain.  Neurological: Positive for  dizziness.  All other systems reviewed and are negative.    Physical Exam Updated Vital Signs BP 120/80 (BP Location: Right Arm)   Pulse 97   Temp 98.4 F (36.9 C) (Oral)   Resp 16   Ht 5\' 6"  (1.676 m)   Wt 104.3 kg   SpO2 98%   BMI 37.12 kg/m   Physical Exam Vitals signs and nursing note reviewed.  Constitutional:      General: She is not in acute distress.    Appearance: She is well-developed.  HENT:     Head: Normocephalic and atraumatic.  Neck:     Musculoskeletal: Neck supple.  Cardiovascular:     Heart sounds: Normal heart sounds. No murmur.     Comments: Tachycardic, but regular. Pulmonary:     Effort: Pulmonary effort is normal. No respiratory distress.     Breath sounds: Normal breath sounds.  Abdominal:     General: There is no distension.     Palpations: Abdomen is soft.     Comments: No abdominal, flank or CVA tenderness.  Genitourinary:    Comments: Chaperone present for exam. Active bleeding. A few clots noted. No CMT. No adnexal masses, tenderness, or fullness.  Skin:    General: Skin is warm and dry.  Neurological:     Mental Status: She is alert and oriented to person, place, and time.      ED Treatments / Results  Labs (all labs ordered are listed, but only abnormal results are displayed) Labs Reviewed  COMPREHENSIVE METABOLIC PANEL - Abnormal; Notable for the following components:      Result Value   Chloride 96 (*)    Calcium 8.8 (*)    Albumin 3.3 (*)    Anion gap 17 (*)    All other components within normal limits  CBC  WITH DIFFERENTIAL/PLATELET - Abnormal; Notable for the following components:   Hemoglobin 10.4 (*)    HCT 34.6 (*)    Platelets 583 (*)    Monocytes Absolute 1.5 (*)    All other components within normal limits  URINALYSIS, ROUTINE W REFLEX MICROSCOPIC - Abnormal; Notable for the following components:   Color, Urine AMBER (*)    APPearance HAZY (*)    Hgb urine dipstick LARGE (*)    Protein, ur 30 (*)    RBC / HPF >50 (*)    Bacteria, UA RARE (*)    All other components within normal limits  I-STAT BETA HCG BLOOD, ED ([facility], [facility], [facility] ONLY)    EKG None  Radiology No results found.  Procedures Procedures (including critical care time)  Medications Ordered in ED Medications  sodium chloride 0.9 % bolus 1,000 mL (0 mLs Intravenous Stopped 11/21/18 1823)     Initial Impression / Assessment and Plan / ED Course  I have reviewed the triage vital signs and the nursing notes.  Pertinent labs & imaging results that were available during my care of the patient were reviewed by me and considered in my medical decision making (see chart for details).       SAJADA BRODIGAN is a 43 y.o. female who presents to ED from urgent care center for vaginal bleeding for the last 2 to 3 weeks in the setting of tachycardia and lightheadedness concerned for symptomatic anemia. On exam, patient is tachycardic in the 110's. No abdominal, flank or CVA tenderness. Labs reviewed showing baseline anemia at 10.4. She feels much improved after 1L IVF. Heart rate normalized. Not orthostatic. She has  a GYN who she can call in the AM. Evaluation does not show pathology that would require ongoing emergent intervention or inpatient treatment. Reasons to return to ER were discussed and all questions answered.   Final Clinical Impressions(s) / ED Diagnoses   Final diagnoses:  Vaginal bleeding    ED Discharge Orders    None       Sheleen Conchas, Ozella Almond, PA-C 11/21/18 1941    Charlesetta Shanks, MD 12/08/18 [phone]

## 2018-11-21 NOTE — ED Triage Notes (Addendum)
Friday noticed dizziness and back pain on right side.  Last week had a drain removed that had been in place for 2 weeks.  3 weeks ago had been in hospital for abdominal abscess  Patient has been having vaginal bleeding for 2 weeks.  Reports speaking to gyn about this and told probably related to stress.    Patient is pale

## 2018-11-30 LAB — AEROBIC/ANAEROBIC CULTURE W GRAM STAIN (SURGICAL/DEEP WOUND): Special Requests: NORMAL

## 2018-12-20 NOTE — Progress Notes (Deleted)
No chief complaint on file.    HPI:      Sandra Bonilla is a 43 y.o. G0P0000 who LMP was No LMP recorded. (Menstrual status: IUD)., presents today for her annual examination.  Her menses are absent due to IUD. Dysmenorrhea none. She does have occasional spotting.   Sex activity: not currently. Has Mirena, replaced 11/14/15 Last Pap: 07/01/17 Results were: no abnormalities /neg HPV DNA. 10/15/16 pap was LGSIL/POS HPV DNA. Neg colpo and bx 9/18. Repeat pap done 4/19. She is due for repeat pap today. She is also on med for MS and should have yearly paps.  Hx of STDs: HPV  Last mammogram: 10/21/17 Results were: normal--routine follow-up in 12 months There is a  FH of breast cancer in her mat aunt, genetic testing not indicated. There is no FH of ovarian cancer. The patient does do self-breast exams.  Tobacco use: The patient denies current or previous tobacco use. Alcohol use: none Exercise: moderately active  She does not get adequate calcium and Vitamin D in her diet.  Labs with PCP.  Past Medical History:  Diagnosis Date  . Cervical high risk human papillomavirus (HPV) DNA test positive 09/2015  . Cholecystitis 2006   GALBLADDER REMOVED  . 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 History of mammogram 11/2014   FIBROADENOMA  . History of Papanicolaou smear of cervix 01/24/13; 10/15/15   -/-; -/+  . Hypertension   . Menorrhagia 02/01/2010   D/C HYSTEROSCOPY ENDO POLYP  . MS (multiple sclerosis) ([place])   . Ovarian cyst 9/15; 2015-2016   LEFT - WENT TO [facility]; RIGHT    Past Surgical History:  Procedure Laterality Date  . BREAST BIOPSY Right 2016   Fibroadenoma  . CHOLECYSTECTOMY  2006  . DILATION AND CURETTAGE, DIAGNOSTIC / THERAPEUTIC  02/01/2010   D/C HY[facility]OSCOPY ENDO POLYP; [facility]  . FLEXIBLE SIGMOIDOSCOPY  07/31/2016   Procedure: FLEXIBLE SIGMOIDOSCOPY;  Surgeon: [name], MD;  [facility];  Service: Endoscopy;;  . HYSTEROSCOPY  02/01/2010   ENDO POLYP -  [facility]  . IR RADIOLOGIST EVAL & MGMT  11/16/2018    Family History  Problem Relation Age of Onset  . Breast cancer Maternal Aunt 25  . Rheum arthritis Mother   . Hypertension Mother   . Thyroid disease Mother        HYPOTHYROIDISM  . Cancer Mother 90       LUNG  . Heart disease Father   . Diabetes Father   . Hypertension Father   . Cancer Paternal Grandmother        MELANOMA OF SKIN  . Cancer Cousin        KIDNEY-COUSIN    Social History   Socioeconomic History  . Marital status: Divorced    Spouse name: Not on file  . Number of children: 0  . Years of education: 12  . Highest education level: Not on file  Occupational History    Employer: [employer]  . Financial resource strain: Not on file  . Food insecurity    Worry: Not on file    Inability: Not on file  . Transportation needs    Medical: Not on file    Non-medical: Not on file  Tobacco Use  . Smoking status: Former Research scientist (life sciences)  . Smokeless tobacco: Never Used  Substance and Sexual Activity  . Alcohol use: No  . Drug use: No  . Sexual activity: Not Currently    Birth control/protection:  I.U.D.    Comment: Mirena  Lifestyle  . Physical activity    Days per week: Not on file    Minutes per session: Not on file  . Stress: Not on file  Relationships  . Social Herbalist on phone: Not on file    Gets together: Not on file    Attends religious service: Not on file    Active member of club or organization: Not on file    Attends meetings of clubs or organizations: Not on file    Relationship status: Not on file  . Intimate partner violence    Fear of current or ex partner: Not on file    Emotionally abused: Not on file    Physically abused: Not on file    Forced sexual activity: Not on file  Other Topics Concern  . Not on file  Social History Narrative  . Not on file     Current Outpatient Medications:  .  acetaminophen (TYLENOL) 325 MG tablet, Take 2 tablets (650 mg total) by mouth  every 6 (six) hours as needed for mild pain (or Fever >/= 101)., Disp: , Rfl:  .  chlorthalidone (HYGROTON) 25 MG tablet, Take 12.5 mg by mouth daily., Disp: , Rfl:  .  cholecalciferol (VITAMIN D3) 25 MCG (1000 UT) tablet, Take 1,000 Units by mouth daily., Disp: , Rfl:  .  cyclobenzaprine (FLEXERIL) 5 MG tablet, Take 1 tablet (5 mg total) by mouth at bedtime as needed. (Patient taking differently: Take 5 mg by mouth at bedtime as needed for muscle spasms. ), Disp: 30 tablet, Rfl: 11 .  escitalopram (LEXAPRO) 20 MG tablet, Take 20 mg by mouth daily. , Disp: , Rfl: 5 .  gabapentin (NEURONTIN) 100 MG capsule, Take 100 mg by mouth daily as needed (nerve pain). , Disp: , Rfl:  .  lamoTRIgine (LAMICTAL) 200 MG tablet, Take 400 mg by mouth daily. , Disp: , Rfl:  .  levonorgestrel (MIRENA) 20 MCG/24HR IUD, 1 each by Intrauterine route once., Disp: , Rfl:  .  meloxicam (MOBIC) 15 MG tablet, Take 1 tablet (15 mg total) by mouth daily. (Patient taking differently: Take 15 mg by mouth daily as needed. ), Disp: 30 tablet, Rfl: 5 .  oxybutynin (DITROPAN) 5 MG tablet, Take 1 tablet (5 mg total) by mouth 2 (two) times daily., Disp: 60 tablet, Rfl: 11 .  Polyvinyl Alcohol-Povidone (REFRESH OP), Place 2 drops into both eyes 4 (four) times daily as needed (dryness)., Disp: , Rfl:  .  Probiotic Product (PROBIOTIC PO), Take 1 capsule by mouth daily., Disp: , Rfl:  .  Teriflunomide (AUBAGIO) 14 MG TABS, Take 1 tablet by mouth daily., Disp: 30 tablet, Rfl: 11  ROS:  Review of Systems  Constitutional: Negative for fatigue, fever and unexpected weight change.  Respiratory: Negative for cough, shortness of breath and wheezing.   Cardiovascular: Negative for chest pain, palpitations and leg swelling.  Gastrointestinal: Negative for blood in stool, constipation, diarrhea, nausea and vomiting.  Endocrine: Negative for cold intolerance, heat intolerance and polyuria.  Genitourinary: Negative for dyspareunia, dysuria, flank  pain, frequency, genital sores, hematuria, menstrual problem, pelvic pain, urgency, vaginal bleeding, vaginal discharge and vaginal pain.  Musculoskeletal: Positive for myalgias. Negative for arthralgias, back pain and joint swelling.  Skin: Negative for rash.  Neurological: Positive for numbness. Negative for dizziness, syncope, light-headedness and headaches.  Hematological: Negative for adenopathy.  Psychiatric/Behavioral: Positive for agitation and dysphoric mood. Negative for confusion, sleep disturbance and  suicidal ideas. The patient is not nervous/anxious.      Objective: There were no vitals taken for this visit.   Physical Exam Constitutional:      Appearance: She is well-developed.  Genitourinary:     Vagina and uterus normal.     No vaginal discharge, erythema or tenderness.     No cervical motion tenderness or polyp.     IUD strings visualized.     Uterus is not enlarged or tender.     No right or left adnexal mass present.     Right adnexa not tender.     Left adnexa not tender.  Neck:     Musculoskeletal: Normal range of motion.     Thyroid: No thyromegaly.  Cardiovascular:     Rate and Rhythm: Normal rate and regular rhythm.     Heart sounds: Normal heart sounds. No murmur.  Pulmonary:     Effort: Pulmonary effort is normal.     Breath sounds: Normal breath sounds.  Chest:     Breasts:        Right: No mass, nipple discharge, skin change or tenderness.        Left: No mass, nipple discharge, skin change or tenderness.  Abdominal:     Palpations: Abdomen is soft.     Tenderness: There is no abdominal tenderness. There is no guarding.  Musculoskeletal: Normal range of motion.  Neurological:     Mental Status: She is alert and oriented to person, place, and time.     Cranial Nerves: No cranial nerve deficit.  Psychiatric:        Behavior: Behavior normal.  Vitals signs reviewed.      Assessment/Plan: No diagnosis found.             GYN counsel  mammography screening, adequate intake of calcium and vitamin D, diet and exercise     F/U  No follow-ups on file.  Alicia B. Copland, PA-C 12/20/2018 4:44 PM

## 2018-12-21 ENCOUNTER — Ambulatory Visit: Payer: BC Managed Care – PPO | Admitting: Obstetrics and Gynecology

## 2018-12-23 ENCOUNTER — Encounter: Payer: Self-pay | Admitting: Gastroenterology

## 2018-12-23 ENCOUNTER — Other Ambulatory Visit: Payer: Self-pay

## 2018-12-23 ENCOUNTER — Other Ambulatory Visit (INDEPENDENT_AMBULATORY_CARE_PROVIDER_SITE_OTHER): Payer: BC Managed Care – PPO

## 2018-12-23 ENCOUNTER — Ambulatory Visit: Payer: BC Managed Care – PPO | Admitting: Gastroenterology

## 2018-12-23 VITALS — BP 102/64 | HR 123 | Temp 97.4°F | Ht 66.0 in | Wt 204.0 lb

## 2018-12-23 DIAGNOSIS — Z8719 Personal history of other diseases of the digestive system: Secondary | ICD-10-CM | POA: Diagnosis not present

## 2018-12-23 DIAGNOSIS — R935 Abnormal findings on diagnostic imaging of other abdominal regions, including retroperitoneum: Secondary | ICD-10-CM

## 2018-12-23 DIAGNOSIS — R112 Nausea with vomiting, unspecified: Secondary | ICD-10-CM | POA: Diagnosis not present

## 2018-12-23 DIAGNOSIS — R1013 Epigastric pain: Secondary | ICD-10-CM

## 2018-12-23 LAB — CBC
HCT: 30.7 % — ABNORMAL LOW (ref 36.0–46.0)
Hemoglobin: 9.5 g/dL — ABNORMAL LOW (ref 12.0–15.0)
MCHC: 31 g/dL (ref 30.0–36.0)
MCV: 81 fl (ref 78.0–100.0)
Platelets: 548 10*3/uL — ABNORMAL HIGH (ref 150.0–400.0)
RBC: 3.79 Mil/uL — ABNORMAL LOW (ref 3.87–5.11)
RDW: 21.4 % — ABNORMAL HIGH (ref 11.5–15.5)
WBC: 14.9 10*3/uL — ABNORMAL HIGH (ref 4.0–10.5)

## 2018-12-23 LAB — COMPREHENSIVE METABOLIC PANEL
ALT: 12 U/L (ref 0–35)
AST: 17 U/L (ref 0–37)
Albumin: 3.9 g/dL (ref 3.5–5.2)
Alkaline Phosphatase: 103 U/L (ref 39–117)
BUN: 11 mg/dL (ref 6–23)
CO2: 28 mEq/L (ref 19–32)
Calcium: 9.8 mg/dL (ref 8.4–10.5)
Chloride: 97 mEq/L (ref 96–112)
Creatinine, Ser: 0.97 mg/dL (ref 0.40–1.20)
GFR: 62 mL/min (ref >60.00)
Glucose, Bld: 106 mg/dL — ABNORMAL HIGH (ref 70–99)
Potassium: 3.8 mEq/L (ref 3.5–5.1)
Sodium: 135 mEq/L (ref 135–145)
Total Bilirubin: 0.9 mg/dL (ref 0.2–1.2)
Total Protein: 7.6 g/dL (ref 6.0–8.3)

## 2018-12-23 LAB — TSH: TSH: 0.87 u[IU]/mL (ref 0.35–4.50)

## 2018-12-23 LAB — CORTISOL: Cortisol, Plasma: 21 ug/dL

## 2018-12-23 LAB — AMYLASE: Amylase: 22 U/L — ABNORMAL LOW (ref 27–131)

## 2018-12-23 MED ORDER — OMEPRAZOLE 40 MG PO CPDR: 40.0000 mg | DELAYED_RELEASE_CAPSULE | Freq: Every day | ORAL | 3 refills | Status: DC

## 2018-12-23 NOTE — Patient Instructions (Addendum)
Your provider has requested that you go to the basement level for lab work before leaving today. Press "B" on the elevator. The lab is located at the first door on the left as you exit the elevator.  If you are age 45 or older, your body mass index should be between 23-30. Your Body mass index is 32.93 kg/m. If this is out of the aforementioned range listed, please consider follow up with your Primary Care Provider.  If you are age 66 or younger, your body mass index should be between 19-25. Your Body mass index is 32.93 kg/m. If this is out of the aformentioned range listed, please consider follow up with your Primary Care Provider.   You have been scheduled for a CT scan of the abdomen and pelvis at [facility] - 1st floor Radiology  You are scheduled on 12/29/18 at 3:30pm. You should arrive 15 minutes prior to your appointment time for registration. Please follow the written instructions below on the day of your exam:  WARNING: IF YOU ARE ALLERGIC TO IODINE/X-RAY DYE, PLEASE NOTIFY RADIOLOGY IMMEDIATELY AT [phone]! YOU WILL BE GIVEN A 13 HOUR PREMEDICATION PREP.  1) Do not eat or drink anything after 11:30am (4 hours prior to your test) 2) You have been given 2 bottles of oral contrast to drink. The solution may taste better if refrigerated, but do NOT add ice or any other liquid to this solution. Shake well before drinking.    Drink 1 bottle of contrast @ 1:30pm (2 hours prior to your exam)  Drink 1 bottle of contrast @ 2:30pm (1 hour prior to your exam)  You may take any medications as prescribed with a small amount of water, if necessary. If you take any of the following medications: METFORMIN, GLUCOPHAGE, GLUCOVANCE, AVANDAMET, RIOMET, FORTAMET, [place] MET, JANUMET, GLUMETZA or METAGLIP, you MAY be asked to HOLD this medication 48 hours AFTER the exam.  The purpose of you drinking the oral contrast is to aid in the visualization of your intestinal tract. The contrast solution may  cause some diarrhea. Depending on your individual set of symptoms, you may also receive an intravenous injection of x-ray contrast/dye. Plan on being at [facility] for 30 minutes or longer, depending on the type of exam you are having performed.  This test typically takes 30-45 minutes to complete.  If you have any questions regarding your exam or if you need to reschedule, you may call the CT department at [phone] between the hours of 8:00 am and 5:00 pm, Monday-Friday.  ________________________________________________________________________  It has been recommended to you by your physician that you have a(n) Colon/Endo @ Hospital completed. Per your request, we did not schedule the procedure(s) today. Please contact our office at [phone] should you decide to have the procedure completed.  We have sent the following medications to your pharmacy for you to pick up at your convenience: Omeprazole  Thank you for choosing me and [facility].  Dr. Rush Landmark

## 2018-12-23 NOTE — Progress Notes (Signed)
[place] VISIT   Primary Care Provider [name], MD [place] [address] [phone]  Referring Provider [name], MD [address] [place],  [place] [address] [phone]  Patient Profile: Sandra Bonilla is a 43 y.o. female with a pmh significant for complicated diverticulitis with abscess status post percutaneous drains, anxiety, depression, cholelithiasis status post cholecystectomy, hypertension, obesity, multiple sclerosis (on no medications).  The patient presents to the [facility] for an evaluation and management of problem(s) noted below:  Problem List 1. Hx of diverticulitis of colon   2. Abnormal CT of the abdomen   3. Non-intractable vomiting with nausea, unspecified vomiting type   4. Dyspepsia     History of Present Illness The patient presents for evaluation via colorectal surgery referral to discuss issues of recurrent abdominal pain and recurrent bouts of diverticulitis.  She has had multiple episodes of diverticulitis with her most recent one being in the summer of this year.  She required drain placement for the complex nature of her abscess that developed from microperforation.  She had an attempted 2018 for flexible sigmoidoscopy but due to stricturing and moderate sedation, the patient was unable to tolerate a completion colonoscopy but a barium enema showed no evidence of any obstructing lesion or mass to the cecum.  The patient states that she has been doing well since the drains were removed after her most recent CT scan in August.  However the course the last few weeks she has had some increased issues with dizziness and shortness of breath which is going to be worked up with pulmonary in the coming few weeks.  She is also had some nausea with and without vomiting at times.  She has noted some new onset GERD-like symptoms especially when she eats spicy foods or hot dogs.  She has  never had any of these issues previously.  At times she has had diarrhea but that is not currently an issue.  She has never had an upper endoscopy.  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  No family history of GI malignancies.  GI Review of Systems Positive as above Negative for dysphagia, odynophagia, decreased appetite, early satiety, melena, hematochezia  Review of Systems General: Positive for weight loss around the time of diverticulitis; denies fevers/chills HEENT: Denies oral lesions Cardiovascular: Denies chest pain Pulmonary: As noted in HPI has had increasing shortness of breath to be worked up to pulmonary Gastroenterological: See HPI Genitourinary: Denies darkened urine or hematuria Hematological: Denies easy bruising/bleeding Endocrine: Denies temperature intolerance Dermatological: Denies jaundice Psychological: Mood is anxious to get better   Medications Current Outpatient Medications  Medication Sig Dispense Refill   acetaminophen (TYLENOL) 325 MG tablet Take 2 tablets (650 mg total) by mouth every 6 (six) hours as needed for mild pain (or Fever >/= 101).     chlorthalidone (HYGROTON) 25 MG tablet Take 12.5 mg by mouth daily.     cholecalciferol (VITAMIN D3) 25 MCG (1000 UT) tablet Take 1,000 Units by mouth daily.     cyclobenzaprine (FLEXERIL) 5 MG tablet Take 1 tablet (5 mg total) by mouth at bedtime as needed. (Patient taking differently: Take 5 mg by mouth at bedtime as needed for muscle spasms. ) 30 tablet 11   escitalopram (LEXAPRO) 20 MG tablet Take 20 mg by mouth daily.   5   gabapentin (NEURONTIN) 100 MG capsule Take 100 mg by mouth daily as needed (nerve pain).  lamoTRIgine (LAMICTAL) 200 MG tablet Take 400 mg by mouth daily.      levonorgestrel (MIRENA) 20 MCG/24HR IUD 1 each by Intrauterine route once.     meloxicam (MOBIC) 15 MG tablet Take 1 tablet (15 mg total) by mouth daily. (Patient taking differently: Take 15 mg by mouth daily as  needed. ) 30 tablet 5   oxybutynin (DITROPAN) 5 MG tablet Take 1 tablet (5 mg total) by mouth 2 (two) times daily. 60 tablet 11   Polyvinyl Alcohol-Povidone (REFRESH OP) Place 2 drops into both eyes 4 (four) times daily as needed (dryness).     Probiotic Product (PROBIOTIC PO) Take 1 capsule by mouth daily.     Teriflunomide (AUBAGIO) 14 MG TABS Take 1 tablet by mouth daily. 30 tablet 11   omeprazole (PRILOSEC) 40 MG capsule Take 1 capsule (40 mg total) by mouth daily before breakfast. 30 capsule 3   No current facility-administered medications for this visit.     Allergies Allergies  Allergen Reactions   Septra [Sulfamethoxazole-Trimethoprim] Rash    Histories Past Medical History:  Diagnosis Date   Anxiety    Cervical high risk human papillomavirus (HPV) DNA test positive 09/2015   Cholecystitis 2006   GALBLADDER REMOVED   Depression    Diverticulosis    Fibroadenoma 2016   RIGHT BREAST   History of mammogram 11/2014   FIBROADENOMA   History of Papanicolaou smear of cervix 01/24/13; 10/15/15   -/-; -/+   Hypertension    Menorrhagia 02/01/2010   D/C HYSTEROSCOPY ENDO POLYP   MS (multiple sclerosis) ([place])    Ovarian cyst 9/15; 2015-2016   LEFT - WENT TO [facility]; RIGHT   Past Surgical History:  Procedure Laterality Date   BREAST BIOPSY Right 2016   Fibroadenoma   CHOLECYSTECTOMY  2006   DILATION AND CURETTAGE, DIAGNOSTIC / THERAPEUTIC  02/01/2010   D/C HYSTEROSCOPY ENDO POLYP; [facility]   FLEXIBLE SIGMOIDOSCOPY  07/31/2016   Procedure: FLEXIBLE SIGMOIDOSCOPY;  Surgeon: [name], MD;  Location: [facility];  Service: Endoscopy;;   HYSTEROSCOPY  02/01/2010   ENDO POLYP - [facility]   IR RADIOLOGIST EVAL & MGMT  11/16/2018   Social History   Socioeconomic History   Marital status: Divorced    Spouse name: Not on file   Number of children: 0   Years of education: 14   Highest education level: Not on file  Occupational History    Employer: [employer]    Social Needs   Financial resource strain: Not on file   Food insecurity    Worry: Not on file    Inability: Not on file   Transportation needs    Medical: Not on file    Non-medical: Not on file  Tobacco Use   Smoking status: Former Smoker   Smokeless tobacco: Never Used  Substance and Sexual Activity   Alcohol use: No   Drug use: No   Sexual activity: Not Currently    Partners: Male    Birth control/protection: I.U.D.    Comment: Mirena  Lifestyle   Physical activity    Days per week: Not on file    Minutes per session: Not on file   Stress: Not on file  Relationships   Social connections    Talks on phone: Not on file    Gets together: Not on file    Attends religious service: Not on file    Active member of club or organization: Not on file    Attends meetings of clubs  or organizations: Not on file    Relationship status: Not on file   Intimate partner violence    Fear of current or ex partner: Not on file    Emotionally abused: Not on file    Physically abused: Not on file    Forced sexual activity: Not on file  Other Topics Concern   Not on file  Social History Narrative   Not on file   Family History  Problem Relation Age of Onset   Breast cancer Maternal Aunt 93   Rheum arthritis Mother    Hypertension Mother    Thyroid disease Mother        HYPOTHYROIDISM   Lung cancer Mother 71   Heart disease Father    Diabetes Father    Hypertension Father    Cancer Paternal Grandmother        MELANOMA OF SKIN   Cancer Cousin        KIDNEY-COUSIN   Colon cancer Neg Hx    Stomach cancer Neg Hx    Pancreatic cancer Neg Hx    Esophageal cancer Neg Hx    Inflammatory bowel disease Neg Hx    Rectal cancer Neg Hx    I have reviewed her medical, social, and family history in detail and updated the electronic medical record as necessary.    PHYSICAL EXAMINATION  BP 102/64    Pulse (!) 123    Temp (!) 97.4 F (36.3 C)    Ht _0   (1.676 m)    Wt 204 lb (92.5 kg)    BMI 32.93 kg/m  Wt Readings from Last 3 Encounters:  12/23/18 204 lb (92.5 kg)  11/21/18 229 lb 15.7 oz (104.3 kg)  10/27/18 230 lb (104.3 kg)  GEN: NAD, appears stated age, doesn't appear chronically ill PSYCH: Cooperative, without pressured speech EYE: Conjunctivae pink, sclerae anicteric ENT: MMM, without oral ulcers, no erythema or exudates noted NECK: Supple, enlarged neck girth CV: RR without R/Gs  RESP: CTAB posteriorly, without wheezing GI: NABS, soft, mild tenderness to palpation in the periumbilical region, no guarding or rebound present, unable to appreciate hepatosplenomegaly due to body habitus, surgical laparoscopic scars present as well as percutaneous drain scar site without any evidence of infection or inflammation  MSK/EXT: No significant lower extremity edema SKIN: No jaundice NEURO:  Alert & Oriented x 3, no focal deficits   REVIEW OF DATA  I reviewed the following data at the time of this encounter:  GI Procedures and Studies  2018 flex sigmoidoscopy - The procedure was aborted due to restricted mobility of the colon. - Anal polyp found on digital rectal exam. - No specimens collected. - Mild diverticulosis in the distal sigmoid colon. There was no evidence of diverticular bleeding.  Laboratory Studies  Reviewed those in epic  Imaging Studies  August 2020 CT abdomen pelvis with contrast IMPRESSION: 1. Resolution of the large abscess collection in the anterior lower abdomen since placement of the percutaneous drain. No residual abscess around the drain. 2. Residual small fluid collections in the bilateral adnexa regions. These fluid collections have decreased in size with peripheral enhancement but no gas. These small collections could represent small abscess or inflammatory collections. These collections are likely too small for percutaneous drainage. Consider follow-up imaging to ensure resolution. 3. Residual mild  inflammatory changes around the distal sigmoid colon and loops of small bowel in the lower abdomen.  August 2020 CT abdomen pelvis with contrast IMPRESSION: Interval worsening in the sigmoid colonic diverticulitis  with new extensive pericolonic abscess as described above. This surrounds the distal ileal loops causing a ileal wall thickening, and extends adjacent to the uterus and right ovary.  July 2020 CT abdomen pelvis with contrast IMPRESSION: 1. There is sigmoid diverticulosis with extensive thickening and fat stranding about the mid sigmoid. There is a tiny focus of extraluminal air anterior to the mid sigmoid (series 2, image 76). Findings are consistent with acute diverticulitis complicated by perforation. There are additional foci of air in the left upper quadrant mesentery (series 2, image 22, 29), which is an unusual location for abdominal air however there is no overt pneumoperitoneum about the liver or diaphragms. No evidence of abscess. 2. There is extensive fatty mural stratification of the ileum and the majority of the colon, with a very tethered appearance of the distal small bowel loops in the right lower quadrant (series 2, image 67). There is sigmoid diverticulosis with extensive thickening and fat stranding about the mid sigmoid. Findings are generally consistent with inflammatory bowel disease, particularly Crohn's disease, without particular findings to suggest acute inflammation. 3. Trace ascites in the low abdomen and pelvis.  2018 barium enema IMPRESSION: Diverticular changes in the left colon. Otherwise unremarkable double contrast barium enema.   ASSESSMENT  Ms. Vogan is a 43 y.o. female with a pmh significant for complicated diverticulitis with abscess status post percutaneous drains, anxiety, depression, cholelithiasis status post cholecystectomy, hypertension, obesity, multiple sclerosis (on no medications).  The patient is seen today for evaluation  and management of:  1. Hx of diverticulitis of colon   2. Abnormal CT of the abdomen   3. Non-intractable vomiting with nausea, unspecified vomiting type   4. Dyspepsia    The patient is hemodynamically stable.  She has had episodes of complicated diverticulitis most recently in the summer of this year.  Seems that she is doing well at this time.  I think a formal colonoscopy attempt should be performed to assure that we are not missing any other mass or lesion however her barium enema from 2018 was unremarkable.  I think she would benefit from an attempt of water immersion with CO2 and UltraSlim colonoscope to give [place] the best opportunity.  This will need to be done in the hospital-based setting as an outpatient.  We will get a CT scan in the coming weeks to ensure that she is not carrying any other issues within her abdominal cavity that would preclude [place] from performing a colonoscopy.  I would like to rule out this abnormal finding that could be suggestive of terminal ileal inflammation though I suspect that is likely a result of her inflammatory process that was occurring in her abdomen but we should try to rule out IBD.  If we are unsuccessful with performing a colonoscopy then I think surgical intervention will give [place] the most information in regards to biopsies of the area once that area has been resected.  I think an upper endoscopy is reasonable as well and I think she needs to be initiated on PPI therapy.  We will check an H. pylori stool antigen first and she will start a PPI and see how she does.  We will plan for an upper endoscopy and colonoscopy to be performed together.  Laboratories to also work-up her issues of nausea and vomiting will be performed.  The risks and benefits of endoscopic evaluation were discussed with the patient; these include but are not limited to the risk of perforation, infection, bleeding, missed lesions, lack  of diagnosis, severe illness requiring hospitalization, as  well as anesthesia and sedation related illnesses.  The patient is agreeable to proceed.  All patient questions were answered, to the best of my ability, and the patient agrees to the aforementioned plan of action with follow-up as indicated.   PLAN  Laboratories as outlined below Stool H. pylori antigen to be obtained Initiate PPI once daily CT abdomen/pelvis to be performed to ensure she is stable for [place] to perform colonoscopy Diagnostic upper and lower endoscopy to be performed in hospital-based setting with use of UltraSlim colonoscope and CO2   Orders Placed This Encounter  Procedures   Helicobacter pylori special antigen   CT Abdomen Pelvis W Contrast   CBC   Comp Met (CMET)   Amylase   TSH   Cortisol    New Prescriptions   OMEPRAZOLE (PRILOSEC) 40 MG CAPSULE    Take 1 capsule (40 mg total) by mouth daily before breakfast.   Modified Medications   No medications on file    Planned Follow Up No follow-ups on file.   Justice Britain, MD [facility] Office # [phone]

## 2018-12-24 ENCOUNTER — Telehealth: Payer: Self-pay | Admitting: Gastroenterology

## 2018-12-24 ENCOUNTER — Other Ambulatory Visit (INDEPENDENT_AMBULATORY_CARE_PROVIDER_SITE_OTHER): Payer: BC Managed Care – PPO

## 2018-12-24 ENCOUNTER — Encounter: Payer: Self-pay | Admitting: Gastroenterology

## 2018-12-24 ENCOUNTER — Other Ambulatory Visit: Payer: Self-pay

## 2018-12-24 DIAGNOSIS — D649 Anemia, unspecified: Secondary | ICD-10-CM | POA: Diagnosis not present

## 2018-12-24 DIAGNOSIS — R1013 Epigastric pain: Secondary | ICD-10-CM | POA: Insufficient documentation

## 2018-12-24 DIAGNOSIS — R935 Abnormal findings on diagnostic imaging of other abdominal regions, including retroperitoneum: Secondary | ICD-10-CM | POA: Insufficient documentation

## 2018-12-24 DIAGNOSIS — Z8719 Personal history of other diseases of the digestive system: Secondary | ICD-10-CM

## 2018-12-24 DIAGNOSIS — R1110 Vomiting, unspecified: Secondary | ICD-10-CM | POA: Insufficient documentation

## 2018-12-24 DIAGNOSIS — R112 Nausea with vomiting, unspecified: Secondary | ICD-10-CM | POA: Diagnosis not present

## 2018-12-24 LAB — IBC PANEL
Iron: <10 ug/dL — ABNORMAL LOW (ref 42–145)
Saturation Ratios: 3.6 % — ABNORMAL LOW (ref 20.0–50.0)
Transferrin: 199 mg/dL — ABNORMAL LOW (ref 212.0–360.0)

## 2018-12-24 LAB — FERRITIN: Ferritin: 328 ng/mL — ABNORMAL HIGH (ref 10.0–291.0)

## 2018-12-24 LAB — VITAMIN B12: Vitamin B-12: 380 pg/mL (ref 211–911)

## 2018-12-24 LAB — FOLATE: Folate: 2.3 ng/mL — ABNORMAL LOW (ref >5.9)

## 2018-12-24 NOTE — Telephone Encounter (Signed)
Pt called to inform Dr. Rush Landmark that her appt with pulmonologist is next week on 12/30/18.

## 2018-12-24 NOTE — Telephone Encounter (Signed)
Thank you for the update. We will see what her work-up from pulmonary suggests but plan on moving forward with our procedures in the next few weeks. Thank you. GM

## 2018-12-27 LAB — HELICOBACTER PYLORI  SPECIAL ANTIGEN
MICRO NUMBER:: 923412
SPECIMEN QUALITY: ADEQUATE

## 2018-12-27 LAB — RETICULOCYTES
ABS Retic: 74000 cells/uL (ref 20000–8000)
Retic Ct Pct: 2 %

## 2018-12-28 ENCOUNTER — Other Ambulatory Visit: Payer: Self-pay

## 2018-12-28 DIAGNOSIS — Z8719 Personal history of other diseases of the digestive system: Secondary | ICD-10-CM

## 2018-12-28 DIAGNOSIS — R112 Nausea with vomiting, unspecified: Secondary | ICD-10-CM

## 2018-12-28 DIAGNOSIS — D649 Anemia, unspecified: Secondary | ICD-10-CM

## 2018-12-28 DIAGNOSIS — R1013 Epigastric pain: Secondary | ICD-10-CM

## 2018-12-28 MED ORDER — FOLIC ACID 1 MG PO TABS: 1.0000 mg | ORAL_TABLET | Freq: Every day | ORAL | 3 refills | Status: AC

## 2018-12-28 MED ORDER — FERROUS GLUCONATE 324 (38 FE) MG PO TABS: 324.0000 mg | ORAL_TABLET | Freq: Every day | ORAL | 3 refills | Status: DC

## 2018-12-29 ENCOUNTER — Ambulatory Visit (HOSPITAL_COMMUNITY): Admission: RE | Admit: 2018-12-29 | Payer: BC Managed Care – PPO | Source: Ambulatory Visit

## 2018-12-29 NOTE — Telephone Encounter (Signed)
Sandra Bonilla from [facility] called to inform that pt has canceled CT appt and declined to reschedule at this time.

## 2018-12-30 DIAGNOSIS — K5720 Diverticulitis of large intestine with perforation and abscess without bleeding: Secondary | ICD-10-CM

## 2018-12-31 NOTE — Telephone Encounter (Signed)
Rovonda, Can you reach out to patient today or early next week? We want to make sure she understands that we need to make sure her abdominal cavity and the previous findings are improved further before attempt at Colonoscopy. Thanks. GM

## 2019-01-14 NOTE — Telephone Encounter (Signed)
Left message on pt's voicemail asking for return call.

## 2019-01-25 DIAGNOSIS — Z8742 Personal history of other diseases of the female genital tract: Secondary | ICD-10-CM | POA: Insufficient documentation

## 2019-01-25 NOTE — Progress Notes (Signed)
Chief Complaint  Patient presents with  . Gynecologic Exam     HPI:      [name] is a 43 y.o. G0P0000 who LMP was No LMP recorded. (Menstrual status: IUD)., presents today for her annual examination.  Her menses are usually absent due to IUD. Dysmenorrhea none. She has had a monthly period for 2 cycles, lasting 5 days, mod flow with clots, no BTB, no dysmen/pelvic pain. Menses restarted after dx and tx of pericolonic abscess 8/20 due to diverticulitis.   Sex activity: not currently. Has Mirena, replaced 11/14/15. IUD in place on 8/20 CT scan. Last Pap: 12/01/17 Results: ASCUS/neg HPV DNA. 07/01/17  no abnormalities /neg HPV DNA. 10/15/16 pap was LGSIL/POS HPV DNA. Neg colpo and bx 9/18. She is due for repeat pap today. She is also on immune modulator med for MS and should have yearly paps.  Hx of STDs: HPV  Last mammogram: 10/21/17 Results were: normal--routine follow-up in 12 months There is a  FH of breast cancer in her mat aunt, genetic testing not indicated. There is no FH of ovarian cancer. The patient does do self-breast exams.  Tobacco use: The patient denies current or previous tobacco use. Alcohol use: none  No drug use. Exercise: moderately active  She does get adequate calcium and Vitamin D in her diet.  Labs with PCP. Having SOB sx, seeing pulmonology. Also seeing GI, will need colonoscopy soon. Hx of diverticulosis.  MS doing well.   Past Medical History:  Diagnosis Date  . Anxiety   . Cervical high risk human papillomavirus (HPV) DNA test positive 09/2015  . Cholecystitis 2006   GALBLADDER REMOVED  . Depression   . Diverticulitis   . Diverticulosis   . 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 History of mammogram 11/2014   FIBROADENOMA  . History of Papanicolaou smear of cervix 01/24/13; 10/15/15   -/-; -/+  . Hypertension   . Menorrhagia 02/01/2010   D/C HYSTEROSCOPY ENDO POLYP  . MS (multiple sclerosis) ([place])   . Ovarian cyst 9/15; 2015-2016   LEFT  - WENT TO [facility]; RIGHT  . 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 BREAST BIOPSY Right 2016   Fibroadenoma  . CHOLECYSTECTOMY  2006  . DILATION AND CURETTAGE, DIAGNOSTIC / THERAPEUTIC  02/01/2010   D/C HYSTEROSCOPY ENDO POLYP; [facility]  . FLEXIBLE SIGMOIDOSCOPY  07/31/2016   Procedure: FLEXIBLE SIGMOIDOSCOPY;  Surgeon: [name], MD;  Location: [facility] END[facility];  Service: Endoscopy;;  . HYSTEROSCOPY  02/01/2010   ENDO POLYP - [facility]  . IR RADIOLOGIST EVAL & MGMT  11/16/2018    Family History  Problem Relation Age of Onset  . Breast cancer Maternal Aunt 50       has contact  . Rheum arthritis Mother   . Hypertension Mother   . Thyroid disease Mother        HYPOTHYROIDISM  . Lung cancer Mother 9  . Heart disease Father   . Diabetes Father   . Hypertension Father   . Cancer Paternal Grandmother        MELANOMA OF SKIN  . Cancer Cousin        KIDNEY-COUSIN  . Colon cancer Neg Hx   . Stomach cancer Neg Hx   . Pancreatic cancer Neg Hx   . Esophageal cancer Neg Hx   . Inflammatory bowel disease Neg Hx   . Rectal cancer Neg Hx  Social History   Socioeconomic History  . Marital status: Divorced    Spouse name: Not on file  . Number of children: 0  . Years of education: 26  . Highest education level: Not on file  Occupational History    Employer: [employer]  . Financial resource strain: Not on file  . Food insecurity    Worry: Not on file    Inability: Not on file  . Transportation needs    Medical: Not on file    Non-medical: Not on file  Tobacco Use  . Smoking status: Former Research scientist (life sciences)  . Smokeless tobacco: Never Used  Substance and Sexual Activity  . Alcohol use: No  . Drug use: No  . Sexual activity: Not Currently    Partners: Male    Birth control/protection: I.U.D.    Comment: Mirena  Lifestyle  . Physical activity    Days per week: Not on file    Minutes per session: Not on file  .  Stress: Not on file  Relationships  . Social Herbalist on phone: Not on file    Gets together: Not on file    Attends religious service: Not on file    Active member of club or organization: Not on file    Attends meetings of clubs or organizations: Not on file    Relationship status: Not on file  . Intimate partner violence    Fear of current or ex partner: Not on file    Emotionally abused: Not on file    Physically abused: Not on file    Forced sexual activity: Not on file  Other Topics Concern  . Not on file  Social History Narrative  . Not on file     Current Outpatient Medications:  .  acetaminophen (TYLENOL) 325 MG tablet, Take 2 tablets (650 mg total) by mouth every 6 (six) hours as needed for mild pain (or Fever >/= 101)., Disp: , Rfl:  .  chlorthalidone (HYGROTON) 25 MG tablet, Take 12.5 mg by mouth daily., Disp: , Rfl:  .  cholecalciferol (VITAMIN D3) 25 MCG (1000 UT) tablet, Take 1,000 Units by mouth daily., Disp: , Rfl:  .  cyclobenzaprine (FLEXERIL) 5 MG tablet, Take 1 tablet (5 mg total) by mouth at bedtime as needed. (Patient taking differently: Take 5 mg by mouth at bedtime as needed for muscle spasms. ), Disp: 30 tablet, Rfl: 11 .  escitalopram (LEXAPRO) 20 MG tablet, Take 20 mg by mouth daily. , Disp: , Rfl: 5 .  ferrous gluconate (FERGON) 324 MG tablet, Take 1 tablet (324 mg total) by mouth daily with breakfast., Disp: 30 tablet, Rfl: 3 .  folic acid (FOLVITE) 1 MG tablet, Take 1 tablet (1 mg total) by mouth daily., Disp: 30 tablet, Rfl: 3 .  gabapentin (NEURONTIN) 100 MG capsule, Take 100 mg by mouth daily as needed (nerve pain). , Disp: , Rfl:  .  lamoTRIgine (LAMICTAL) 200 MG tablet, Take 400 mg by mouth daily. , Disp: , Rfl:  .  levonorgestrel (MIRENA) 20 MCG/24HR IUD, 1 each by Intrauterine route once., Disp: , Rfl:  .  LORazepam (ATIVAN) 1 MG tablet, Take by mouth., Disp: , Rfl:  .  meloxicam (MOBIC) 15 MG tablet, Take 1 tablet (15 mg total) by  mouth daily. (Patient taking differently: Take 15 mg by mouth daily as needed. ), Disp: 30 tablet, Rfl: 5 .  omeprazole (PRILOSEC) 40 MG capsule, Take 1 capsule (40 mg total)  by mouth daily before breakfast., Disp: 30 capsule, Rfl: 3 .  oxybutynin (DITROPAN) 5 MG tablet, Take 1 tablet (5 mg total) by mouth 2 (two) times daily., Disp: 60 tablet, Rfl: 11 .  Polyvinyl Alcohol-Povidone (REFRESH OP), Place 2 drops into both eyes 4 (four) times daily as needed (dryness)., Disp: , Rfl:  .  Probiotic Product (PROBIOTIC PO), Take 1 capsule by mouth daily., Disp: , Rfl:  .  Teriflunomide (AUBAGIO) 14 MG TABS, Take 1 tablet by mouth daily., Disp: 30 tablet, Rfl: 11 .  umeclidinium-vilanterol (ANORO ELLIPTA) 62.5-25 MCG/INH AEPB, Inhale into the lungs., Disp: , Rfl:   ROS:  Review of Systems  Constitutional: Positive for fatigue. Negative for fever and unexpected weight change.  Respiratory: Positive for shortness of breath. Negative for cough and wheezing.   Cardiovascular: Negative for chest pain, palpitations and leg swelling.  Gastrointestinal: Negative for blood in stool, constipation, diarrhea, nausea and vomiting.  Endocrine: Negative for cold intolerance, heat intolerance and polyuria.  Genitourinary: Negative for dyspareunia, dysuria, flank pain, frequency, genital sores, hematuria, menstrual problem, pelvic pain, urgency, vaginal bleeding, vaginal discharge and vaginal pain.  Musculoskeletal: Negative for arthralgias, back pain, joint swelling and myalgias.  Skin: Negative for rash.  Neurological: Negative for dizziness, syncope, light-headedness, numbness and headaches.  Hematological: Negative for adenopathy.  Psychiatric/Behavioral: Positive for agitation. Negative for confusion, dysphoric mood, sleep disturbance and suicidal ideas. The patient is not nervous/anxious.      Objective: BP 110/80   Ht 5\' 5"  (1.651 m)   Wt 206 lb (93.4 kg)   BMI 34.28 kg/m    Physical  Exam Constitutional:      Appearance: She is well-developed.  Genitourinary:     Vulva, vagina, uterus, right adnexa and left adnexa normal.     No vulval lesion or tenderness noted.     No vaginal discharge, erythema or tenderness.     No cervical motion tenderness or polyp.     No IUD strings visualized.     Uterus is not enlarged or tender.     No right or left adnexal mass present.     Right adnexa not tender.     Left adnexa not tender.     Genitourinary Comments: IUD STRINGS NOT SEEN IN CX OS  Neck:     Musculoskeletal: Normal range of motion.     Thyroid: No thyromegaly.  Cardiovascular:     Rate and Rhythm: Normal rate and regular rhythm.     Heart sounds: Normal heart sounds. No murmur.  Pulmonary:     Effort: Pulmonary effort is normal.     Breath sounds: Normal breath sounds.  Chest:     Breasts:        Right: No mass, nipple discharge, skin change or tenderness.        Left: No mass, nipple discharge, skin change or tenderness.  Abdominal:     Palpations: Abdomen is soft.     Tenderness: There is no abdominal tenderness. There is no guarding.  Musculoskeletal: Normal range of motion.  Neurological:     General: No focal deficit present.     Mental Status: She is alert and oriented to person, place, and time.     Cranial Nerves: No cranial nerve deficit.  Skin:    General: Skin is warm and dry.  Psychiatric:        Mood and Affect: Mood normal.        Behavior: Behavior normal.  Thought Content: Thought content normal.        Judgment: Judgment normal.  Vitals signs reviewed.      Assessment/Plan: Encounter for annual routine gynecological examination  Cervical cancer screening - Plan: IGP, Aptima HPV  History of abnormal cervical Pap smear - Plan: IGP, Aptima HPV  Encounter for screening mammogram for malignant neoplasm of breast - Plan: MM 3D SCREEN BREAST BILATERAL; pt to sched mammo  Encounter for routine checking of intrauterine  contraceptive device (IUD); Due for removal 8/23  Intrauterine contraceptive device threads lost, initial encounter; IUD strings not visible on exam. IUD seen on 8/20 CT scan. Pt has repeat CT scan in a month and will confirm still there since now having regular menses. F/u if IUD gone. Pt is not sex active.              GYN counsel mammography screening, adequate intake of calcium and vitamin D, diet and exercise     F/U  Return in about 1 year (around 01/26/2020).  Chananya Canizalez B. Honor Frison, PA-C 01/26/2019 8:58 AM

## 2019-01-26 ENCOUNTER — Other Ambulatory Visit: Payer: Self-pay

## 2019-01-26 ENCOUNTER — Ambulatory Visit (INDEPENDENT_AMBULATORY_CARE_PROVIDER_SITE_OTHER): Payer: BC Managed Care – PPO | Admitting: Obstetrics and Gynecology

## 2019-01-26 ENCOUNTER — Encounter: Payer: Self-pay | Admitting: Obstetrics and Gynecology

## 2019-01-26 VITALS — BP 110/80 | Ht 65.0 in | Wt 206.0 lb

## 2019-01-26 DIAGNOSIS — Z01419 Encounter for gynecological examination (general) (routine) without abnormal findings: Secondary | ICD-10-CM | POA: Diagnosis not present

## 2019-01-26 DIAGNOSIS — Z30431 Encounter for routine checking of intrauterine contraceptive device: Secondary | ICD-10-CM

## 2019-01-26 DIAGNOSIS — Z1231 Encounter for screening mammogram for malignant neoplasm of breast: Secondary | ICD-10-CM

## 2019-01-26 DIAGNOSIS — Z124 Encounter for screening for malignant neoplasm of cervix: Secondary | ICD-10-CM

## 2019-01-26 DIAGNOSIS — Z8742 Personal history of other diseases of the female genital tract: Secondary | ICD-10-CM

## 2019-01-26 DIAGNOSIS — T8332XA Displacement of intrauterine contraceptive device, initial encounter: Secondary | ICD-10-CM | POA: Insufficient documentation

## 2019-01-26 NOTE — Patient Instructions (Signed)
I value your feedback and entrusting us with your care. If you get a [place] patient survey, I would appreciate you taking the time to let us know about your experience today. Thank you!  [facility]: [phone]    

## 2019-01-30 ENCOUNTER — Other Ambulatory Visit: Payer: Self-pay | Admitting: Neurology

## 2019-02-02 ENCOUNTER — Telehealth: Payer: Self-pay | Admitting: *Deleted

## 2019-02-02 NOTE — Telephone Encounter (Signed)
Noted, thank you

## 2019-02-02 NOTE — Telephone Encounter (Signed)
Pt has agreed and appt has been changed.

## 2019-02-02 NOTE — Telephone Encounter (Signed)
Called, Lvm for pt to call about upcoming appt on 03/08/2019. Dr. Felecia Shelling has meeting at 11:30am and he is wondering if she is ok to come at 2:30pm that day instead?

## 2019-02-03 LAB — IGP, APTIMA HPV: HPV Aptima: POSITIVE — AB

## 2019-02-11 ENCOUNTER — Telehealth: Payer: Self-pay

## 2019-02-11 NOTE — Telephone Encounter (Signed)
-----   Message from [name], MD sent at 02/11/2019  2:46 AM EST ----- Regarding: Follow-up Rhonda,Please find out what is going on with this patient in regards to getting her imaging and getting her on the schedule for her colonoscopy in the hospital?Thanks.GM

## 2019-02-11 NOTE — Telephone Encounter (Signed)
Called and left message x2. Will try reaching pt again later this afternoon.

## 2019-02-17 ENCOUNTER — Other Ambulatory Visit: Payer: Self-pay

## 2019-02-17 NOTE — Telephone Encounter (Signed)
Pt returned call. Pt will be r/s for CT scan. Pt understands that Colonoscopy at hospital will now need to be done in Jan 2021, if CT scan is okay. Pt voiced understanding.

## 2019-02-22 NOTE — Telephone Encounter (Signed)
You have been scheduled for a CT scan of the abdomen and pelvis at [facility] 1st floor Radiology   You are scheduled on 03/07/19  at 9:30am. You should arrive at 9:15am (15 minutes prior )to your appointment time for registration. Please follow the written instructions below on the day of your exam:  WARNING: IF YOU ARE ALLERGIC TO IODINE/X-RAY DYE, PLEASE NOTIFY RADIOLOGY IMMEDIATELY AT [phone]! YOU WILL BE GIVEN A 13 HOUR PREMEDICATION PREP.  1) Do not eat or drink anything after 5:30am (4 hours prior to your test) 2) You have been given 2 bottles of oral contrast to drink. The solution may taste better if refrigerated, but do NOT add ice or any other liquid to this solution. Shake well before drinking.    Drink 1 bottle of contrast @ 7:30am (2 hours prior to your exam)  Drink 1 bottle of contrast @ 8:30am (1 hour prior to your exam)  You may take any medications as prescribed with a small amount of water, if necessary. If you take any of the following medications: METFORMIN, GLUCOPHAGE, GLUCOVANCE, AVANDAMET, RIOMET, FORTAMET, [place] MET, JANUMET, GLUMETZA or METAGLIP, you MAY be asked to HOLD this medication 48 hours AFTER the exam.  The purpose of you drinking the oral contrast is to aid in the visualization of your intestinal tract. The contrast solution may cause some diarrhea. Depending on your individual set of symptoms, you may also receive an intravenous injection of x-ray contrast/dye. Plan on being at [facility]  for 30 minutes or longer, depending on the type of exam you are having performed.  This test typically takes 30-45 minutes to complete.  If you have any questions regarding your exam or if you need to reschedule, you may call the CT department at [phone] between the hours of 8:00 am and 5:00 pm, Monday-Friday.  ________________________________________________________________________

## 2019-02-26 ENCOUNTER — Other Ambulatory Visit: Payer: Self-pay | Admitting: Neurology

## 2019-03-07 ENCOUNTER — Ambulatory Visit
Admission: RE | Admit: 2019-03-07 | Discharge: 2019-03-07 | Disposition: A | Discharge status: Home / Self Care | Payer: BC Managed Care – PPO | Source: Ambulatory Visit | Attending: Gastroenterology | Admitting: Gastroenterology

## 2019-03-07 ENCOUNTER — Other Ambulatory Visit: Payer: Self-pay

## 2019-03-07 DIAGNOSIS — R112 Nausea with vomiting, unspecified: Secondary | ICD-10-CM | POA: Diagnosis present

## 2019-03-07 DIAGNOSIS — Z8719 Personal history of other diseases of the digestive system: Secondary | ICD-10-CM | POA: Insufficient documentation

## 2019-03-07 DIAGNOSIS — R1013 Epigastric pain: Secondary | ICD-10-CM | POA: Insufficient documentation

## 2019-03-07 LAB — POCT I-STAT CREATININE: Creatinine, Ser: 0.8 mg/dL (ref 0.44–1.00)

## 2019-03-07 MED ORDER — IOHEXOL 300 MG/ML  SOLN
100.0000 mL | Freq: Once | INTRAMUSCULAR | Status: AC | PRN
  Administered 2019-03-07: 100 mL via INTRAVENOUS

## 2019-03-08 ENCOUNTER — Other Ambulatory Visit (INDEPENDENT_AMBULATORY_CARE_PROVIDER_SITE_OTHER): Payer: BC Managed Care – PPO

## 2019-03-08 ENCOUNTER — Encounter: Payer: Self-pay | Admitting: Neurology

## 2019-03-08 ENCOUNTER — Ambulatory Visit (INDEPENDENT_AMBULATORY_CARE_PROVIDER_SITE_OTHER): Payer: BC Managed Care – PPO | Admitting: Neurology

## 2019-03-08 ENCOUNTER — Other Ambulatory Visit: Payer: Self-pay

## 2019-03-08 VITALS — BP 100/70 | HR 82 | Temp 96.6°F | Ht 65.0 in | Wt 211.0 lb

## 2019-03-08 DIAGNOSIS — F418 Other specified anxiety disorders: Secondary | ICD-10-CM | POA: Diagnosis not present

## 2019-03-08 DIAGNOSIS — D649 Anemia, unspecified: Secondary | ICD-10-CM | POA: Diagnosis not present

## 2019-03-08 DIAGNOSIS — R39198 Other difficulties with micturition: Secondary | ICD-10-CM | POA: Diagnosis not present

## 2019-03-08 DIAGNOSIS — R269 Unspecified abnormalities of gait and mobility: Secondary | ICD-10-CM | POA: Diagnosis not present

## 2019-03-08 DIAGNOSIS — Z8719 Personal history of other diseases of the digestive system: Secondary | ICD-10-CM

## 2019-03-08 DIAGNOSIS — R5383 Other fatigue: Secondary | ICD-10-CM

## 2019-03-08 DIAGNOSIS — G35 Multiple sclerosis: Secondary | ICD-10-CM

## 2019-03-08 LAB — COMPREHENSIVE METABOLIC PANEL
ALT: 17 U/L (ref 0–35)
AST: 21 U/L (ref 0–37)
Albumin: 4 g/dL (ref 3.5–5.2)
Alkaline Phosphatase: 91 U/L (ref 39–117)
BUN: 14 mg/dL (ref 6–23)
CO2: 31 mEq/L (ref 19–32)
Calcium: 9 mg/dL (ref 8.4–10.5)
Chloride: 99 mEq/L (ref 96–112)
Creatinine, Ser: 0.84 mg/dL (ref 0.40–1.20)
GFR: 73.13 mL/min (ref >60.00)
Glucose, Bld: 85 mg/dL (ref 70–99)
Potassium: 3.5 mEq/L (ref 3.5–5.1)
Sodium: 137 mEq/L (ref 135–145)
Total Bilirubin: 0.5 mg/dL (ref 0.2–1.2)
Total Protein: 6.9 g/dL (ref 6.0–8.3)

## 2019-03-08 LAB — CBC WITH DIFFERENTIAL/PLATELET
Basophils Absolute: 0.3 10*3/uL — ABNORMAL HIGH (ref 0.0–0.1)
Basophils Relative: 3.3 % — ABNORMAL HIGH (ref 0.0–3.0)
Eosinophils Absolute: 0.3 10*3/uL (ref 0.0–0.7)
Eosinophils Relative: 3 % (ref 0.0–5.0)
HCT: 36.7 % (ref 36.0–46.0)
Hemoglobin: 11.6 g/dL — ABNORMAL LOW (ref 12.0–15.0)
Lymphocytes Relative: 20.4 % (ref 12.0–46.0)
Lymphs Abs: 1.8 10*3/uL (ref 0.7–4.0)
MCHC: 31.5 g/dL (ref 30.0–36.0)
MCV: 82.5 fl (ref 78.0–100.0)
Monocytes Absolute: 1 10*3/uL (ref 0.1–1.0)
Monocytes Relative: 11.3 % (ref 3.0–12.0)
Neutro Abs: 5.6 10*3/uL (ref 1.4–7.7)
Neutrophils Relative %: 62 % (ref 43.0–77.0)
Platelets: 426 10*3/uL — ABNORMAL HIGH (ref 150.0–400.0)
RBC: 4.45 Mil/uL (ref 3.87–5.11)
RDW: 18.4 % — ABNORMAL HIGH (ref 11.5–15.5)
WBC: 9 10*3/uL (ref 4.0–10.5)

## 2019-03-08 LAB — HIGH SENSITIVITY CRP: CRP, High Sensitivity: 15.83 mg/L — ABNORMAL HIGH (ref 0.000–5.000)

## 2019-03-08 LAB — SEDIMENTATION RATE: Sed Rate: 64 mm/hr — ABNORMAL HIGH (ref 0–20)

## 2019-03-08 NOTE — Progress Notes (Signed)
[facility]  PATIENT: Sandra Bonilla DOB: 09-22-1975  REFERRING DOCTOR OR PCP:   Dorisann Frames SOURCE: patient and medical records  _________________________________   HISTORICAL  CHIEF COMPLAINT:  Chief Complaint  Patient presents with   Follow-up    RM 13, alone. Last seen 09/06/2018.    Multiple Sclerosis    On Aubagio    HISTORY OF PRESENT ILLNESS:  Sandra Bonilla is a 43 y.o. 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     Update 03/08/2019: She feels her MS is stable and she denies any exacerbation or new symptom.     She is walking well most of the time with occasioanl mild decreased balance.   No falls.    She denies any significant numbness or weakness.    She has urinary frequency and is on oxybutynin.    No UTI's.        Fatigue was worse with a recent illness but is closer to baseline.  She was hospitalized for an abdominal abscess a few months ago and he Aubagio was stopped x 2 weeks.    She noticed no issues going off or back on.   She had a drain placed and it was removed 2 weeks later.   She now feels close to baseline.    She was found to have mild asthma and was placed on an inhaler.    Follow up CT shows possible new abscess but she feels fine.     She has diverticulosis.  She denies depression.   No cognitive issues.   She is on Lexapro and lamotrigine.    Update 09/06/2018: Her MS is stable and she has no exacerbation.   She has had more pain and tingling in the legs since the temperatures increased.  She also notes that her verbal comprehension seems worse though visual comprehension is the same.    Gait is doing the same.  Balance is a little off.   No falls..  No numbness but she has tingling in both thighs that fluctuates.     Bladder is doing well.   Vision is doing well and she notes no asymmetry in colors  She feels fatigue is worse, especially in heat.    She will take a nap on the weekend but not weekdays.   She notes mild anxiety  with some of the work/life changes.  Lamotrigine and escitalopram  helps mood.   She sleeps well at night.   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    She has LBP that fluctuates with activity.   Moving/shifting helps reduce the pain when present.   She takes flexeril and meloxicam prn with some benefit.     Update 12/30/2017: For the most part, her MS is stable on Aubagio.,    She tolerates it well.    Her main problem is gait but she feels off balanced, pushed to the right.    She denies weakness or spasticity.   She has no numbness or dysesthesia.    She has urinary urgency with occasional urge incontinence.   She has 3 - 4 times nocturia.     Vision is doing well (new bifocals).  She notes some fatigue but she accomplish what she needs to do.   Sleep is doing well except nocturia and she usually falls back asleep well.     She notes occasional verbal processing, esp if more than one person speaks.  She has mils anxiety but no depression.     For LBP (doing better) she just takes Flexeril and meloxicam as needed.   Last MRI 07/02/2016 was stable.     Update 06/29/2017: She feels her MS is mostly stable. She tolerates Aubagio well.   The last MRI 06/2016 was stable.  She is sometimes veering to the left when she walks.   No vertigo.   Her strength is normal though her right leg sometimes feels like it will give out.   She denies numbness or dysesthesia.   Vision is ok (needs reading glasses).   No diplopia.      She has some nocturia (x2).     Fatigue varies and she feels she gets tired easily.    She sleeps well most nights.   She denies depression but notes some anxiety.     She feels processing os sometimes slow with language (asks people to repeat) but no major problems.  Migraines are doing well --- no bad ones recently.   She also has left lower back pain without radiation.  She tries to exercise at the gym twice weekly.  Update 12/26/2016:    She feels  her MS is stable and she tolerates Aubagio well.   She denies exacerbations but her gait is mildly off balanced as before.   She denies weakness or numbness.   She sometimes has vertigo but no falls.      Bladder is fine.   Vision is mildly blurry but better with eye drops.   She reports fatigue most days.    She has insomnia, sleep maintenance > sleep onset.      Migraines are doing well, only occurring once most months.   Ibuprofen helps relieve pain.  She has had LBP x 2 months, pain is L > R and does not radiate into her legs.    Pain does not change with positions or activities.   She denies numbness/weakness in hr legs.    Ibuprofen helps the pain some.    She takes it once , usually at night.     _____________________________________ From 06/25/2016 MS:  She is on Aubagio tolerates it well. She denies any MS exacerbation.  Although she feels her MS is mostly stable, she notes she has a little more trouble understanding complicated tasks and people at work need to slow down and repeat task instructions.   MRI of the brain 04/25/2015 showed lesions in the white matter consistent with multiple sclerosis and MRI of the cervical spine that day showed multiple lesions in the upper cervical spinal cord but no acute findings.  There was no change when compared to 12/25/2013 MRI.     She remains on Aubagio since 2013 and is tolerating it well.   She brought in some MRIs of the brain and spinal cord from 2006. I compare that with her 2017 MRI. The MRI of the cervical spine looks essentially unchanged. The MRI of the brain shows that there has been some lesions over the ten-year difference though majorityof the lesions were present on both MRIs.            .  Gait/strength/sensation:  She has mildly off balanced gait and veers to the right.   This is slightly worse over the past year.     Her left arm tingling improved and now is very intermittent.   Left grip is slightly worse than right (is left  handed) Bladder/Bowel:   She denies  any bladder or bowel difficulties.   She has nocturia x 1 some nights.    Vision:   She notes reduced VA with reading and has been prescribed reading glasses.   She had Lasik in the past.    No h/o optic neuritis or dipo[pia.     Fatigue/sleep: She has physical and cognitive fatigue, some days worse than others.  This is stable  . She has sleep maintenance insomnia.Imipramine helps with sleep onset and sleep maintenance.    She snores but no witnessed OSA and notes EDS.   Migraines:   These improved and she has had only one since the last visit.  Imipramine has helped.   When present, they are unilateral but can be either side.   Tylenol helps some.     She someimes has blurry vision but no aura.     Mood/cognition: She notes mild depression and mild anxiety.     She feels better.  nxiety is also better    She is currently on BuSpar 15 mg po tid and lamotrigine 200 mg po qd.  She has not noted major problems with cognition. Occasionally she has some issue with parallel processing. She notes she has a little more trouble understanding complicated tasks and people at work need to slow down and repeat task instructions.. She does not have any major problem with memory or word finding.  MS HIstory:       About  2006, she noted gait changes and her right foot was clumsy.    She had an MRI consistent with MS and was referred to Dr. Jacolyn Reedy [facility]) who diagnosed her with MS.   She then saw Dr. Felipe Drone and then Dr. Deloria Lair at [facility].  She was reluctant to take a medication until  2012 years ago when she started Copaxone.   She had skin reactions, she stopped after one year and switched to Aubagio (+/- late 2013).   She has tolerated it well.     REVIEW OF SYSTEMS: Constitutional: No fevers, chills, sweats, or change in appetite   She notes fatigue and poor sleep.    Eyes: No visual changes, double vision, eye pain Ear, nose and throat: No hearing loss, ear pain,  nasal congestion, sore throat Cardiovascular: No chest pain, palpitations Respiratory: No shortness of breath at rest or with exertion.   No wheezes.  Some snoring GastrointestinaI: No nausea, vomiting, diarrhea, abdominal pain, fecal incontinence Genitourinary: No dysuria, urinary retention or frequency.  No nocturia. Musculoskeletal: No neck pain, back pain Integumentary: No rash, pruritus, skin lesions Neurological: as above Psychiatric: No depression at this time. Some anxiety Endocrine: No palpitations, diaphoresis, change in appetite, change in weigh or increased thirst Hematologic/Lymphatic: No anemia, purpura, petechiae. Allergic/Immunologic: No itchy/runny eyes, nasal congestion, recent allergic reactions, rashes  ALLERGIES: Allergies  Allergen Reactions   Sulfamethoxazole-Trimethoprim Rash and Hives   Sulfa Antibiotics Other (See Comments)    HOME MEDICATIONS:  Current Outpatient Medications:    acetaminophen (TYLENOL) 325 MG tablet, Take 2 tablets (650 mg total) by mouth every 6 (six) hours as needed for mild pain (or Fever >/= 101)., Disp: , Rfl:    AUBAGIO 14 MG TABS, TAKE 1 TABLET BY MOUTH  DAILY, Disp: 30 tablet, Rfl: 11   chlorthalidone (HYGROTON) 25 MG tablet, Take 12.5 mg by mouth daily., Disp: , Rfl:    cholecalciferol (VITAMIN D3) 25 MCG (1000 UT) tablet, Take 1,000 Units by mouth daily., Disp: , Rfl:    cyclobenzaprine (FLEXERIL) 5  MG tablet, Take 1 tablet (5 mg total) by mouth at bedtime as needed. (Patient taking differently: Take 5 mg by mouth at bedtime as needed for muscle spasms. ), Disp: 30 tablet, Rfl: 11   escitalopram (LEXAPRO) 20 MG tablet, Take 20 mg by mouth daily. , Disp: , Rfl: 5   gabapentin (NEURONTIN) 100 MG capsule, Take 100 mg by mouth daily as needed (nerve pain). , Disp: , Rfl:    lamoTRIgine (LAMICTAL) 200 MG tablet, Take 400 mg by mouth daily. , Disp: , Rfl:    levonorgestrel (MIRENA) 20 MCG/24HR IUD, 1 each by Intrauterine  route once., Disp: , Rfl:    LORazepam (ATIVAN) 1 MG tablet, Take by mouth., Disp: , Rfl:    meloxicam (MOBIC) 15 MG tablet, Take 1 tablet (15 mg total) by mouth daily. (Patient taking differently: Take 15 mg by mouth daily as needed. ), Disp: 30 tablet, Rfl: 5   omeprazole (PRILOSEC) 40 MG capsule, Take 1 capsule (40 mg total) by mouth daily before breakfast., Disp: 30 capsule, Rfl: 3   oxybutynin (DITROPAN) 5 MG tablet, TAKE 1 TABLET(5 MG) BY MOUTH TWICE DAILY, Disp: 60 tablet, Rfl: 11   Polyvinyl Alcohol-Povidone (REFRESH OP), Place 2 drops into both eyes 4 (four) times daily as needed (dryness)., Disp: , Rfl:    Probiotic Product (PROBIOTIC PO), Take 1 capsule by mouth daily., Disp: , Rfl:    TRELEGY ELLIPTA 100-62.5-25 MCG/INH AEPB, Inhale 1 puff into the lungs daily., Disp: , Rfl:    umeclidinium-vilanterol (ANORO ELLIPTA) 62.5-25 MCG/INH AEPB, Inhale into the lungs., Disp: , Rfl:    ferrous gluconate (FERGON) 324 MG tablet, Take 1 tablet (324 mg total) by mouth daily with breakfast., Disp: 30 tablet, Rfl: 3  PAST MEDICAL HISTORY: Past Medical History:  Diagnosis Date   Anxiety    Cervical high risk human papillomavirus (HPV) DNA test positive 09/2015   Cholecystitis 2006   GALBLADDER REMOVED   Depression    Diverticulitis    Diverticulosis    Fibroadenoma 2016   RIGHT BREAST   History of mammogram 11/2014   FIBROADENOMA   History of Papanicolaou smear of cervix 01/24/13; 10/15/15   -/-; -/+   Hypertension    Menorrhagia 02/01/2010   D/C HYSTEROSCOPY ENDO POLYP   MS (multiple sclerosis) ([place])    Ovarian cyst 9/15; 2015-2016   LEFT - WENT TO [facility]; RIGHT   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    PAST SURGICAL HISTORY: Past Surgical History:  Procedure Laterality Date   BREAST BIOPSY Right 2016   Fibroadenoma   CHOLECYSTECTOMY  2006   DILATION AND CURETTAGE, DIAGNOSTIC / THERAPEUTIC  02/01/2010   D/C HYSTEROSCOPY ENDO POLYP; [facility]    FLEXIBLE SIGMOIDOSCOPY  07/31/2016   Procedure: FLEXIBLE SIGMOIDOSCOPY;  Surgeon: [name], MD;  Location: [facility];  Service: Endoscopy;;   HYSTEROSCOPY  02/01/2010   ENDO POLYP - [facility]   IR RADIOLOGIST EVAL & MGMT  11/16/2018    FAMILY HISTORY: Family History  Problem Relation Age of Onset   Breast cancer Maternal Aunt 53       has contact   Rheum arthritis Mother    Hypertension Mother    Thyroid disease Mother        HYPOTHYROIDISM   Lung cancer Mother 72   Heart disease Father    Diabetes Father    Hypertension Father    Cancer Paternal Grandmother        MELANOMA OF SKIN   Cancer Cousin  KIDNEY-COUSIN   Colon cancer Neg Hx    Stomach cancer Neg Hx    Pancreatic cancer Neg Hx    Esophageal cancer Neg Hx    Inflammatory bowel disease Neg Hx    Rectal cancer Neg Hx     SOCIAL HISTORY:  Social History   Socioeconomic History   Marital status: Divorced    Spouse name: Not on file   Number of children: 0   Years of education: 14   Highest education level: Not on file  Occupational History    Employer: [employer]  Social Needs   Financial resource strain: Not on file   Food insecurity    Worry: Not on file    Inability: Not on file   Transportation needs    Medical: Not on file    Non-medical: Not on file  Tobacco Use   Smoking status: Former Smoker   Smokeless tobacco: Never Used  Substance and Sexual Activity   Alcohol use: No   Drug use: No   Sexual activity: Not Currently    Partners: Male    Birth control/protection: I.U.D.    Comment: Mirena  Lifestyle   Physical activity    Days per week: Not on file    Minutes per session: Not on file   Stress: Not on file  Relationships   Social connections    Talks on phone: Not on file    Gets together: Not on file    Attends religious service: Not on file    Active member of club or organization: Not on file    Attends meetings of clubs or organizations: Not  on file    Relationship status: Not on file   Intimate partner violence    Fear of current or ex partner: Not on file    Emotionally abused: Not on file    Physically abused: Not on file    Forced sexual activity: Not on file  Other Topics Concern   Not on file  Social History Narrative   Not on file     PHYSICAL EXAM  Vitals:   03/08/19 1437  BP: 100/70  Pulse: 82  Temp: (!) 96.6 F (35.9 C)  SpO2: 96%  Weight: 211 lb (95.7 kg)  Height: 5\' 5"  (1.651 m)    Body mass index is 35.11 kg/m.   General: The patient is well-developed and well-nourished and in no acute distress.     Neurologic Exam  Mental status: The patient is alert and oriented x 3 at the time of the examination. The patient has apparent normal recent and remote memory, with an apparently normal attention span and concentration ability.   Speech is normal.  Cranial nerves: Extraocular muscles are normal.  Facial strength and sensation are normal.   Trapezius is strong  Hearing is normal   Motor:  Muscle bulk is normal.   Muscle tone and strength is normal in the arms and legs.  Sensory: She has intact sensation to touch and vibration in the arms and legs.  Coordination: Cerebellar testing reveals good finger-nose-finger and heel-to-shin bilaterally.  Gait and station: Station is normal.   The gait is normal  Tandem gait is slightly wide..  The Romberg is negative.  Reflexes: Deep tendon reflexes are symmetric and normal bilaterally in arms and legs.    No clonus or spread.        DIAGNOSTIC DATA (LABS, IMAGING, TESTING) - I reviewed patient records, labs, notes, testing and imaging myself where available.  Lab Results  Component Value Date   WBC 14.9 (H) 12/23/2018   HGB 9.5 (L) 12/23/2018   HCT 30.7 (L) 12/23/2018   MCV 81.0 12/23/2018   PLT 548.0 (H) 12/23/2018      Component Value Date/Time   NA 135 12/23/2018 1644   NA 141 02/05/2016 1533   K 3.8 12/23/2018 1644   CL 97 12/23/2018  1644   CO2 28 12/23/2018 1644   GLUCOSE 106 (H) 12/23/2018 1644   BUN 11 12/23/2018 1644   BUN 9 02/05/2016 1533   CREATININE 0.80 03/07/2019 0934   CALCIUM 9.8 12/23/2018 1644   PROT 7.6 12/23/2018 1644   PROT 6.6 12/30/2017 0905   ALBUMIN 3.9 12/23/2018 1644   ALBUMIN 4.2 12/30/2017 0905   AST 17 12/23/2018 1644   ALT 12 12/23/2018 1644   ALKPHOS 103 12/23/2018 1644   BILITOT 0.9 12/23/2018 1644   BILITOT 0.7 12/30/2017 0905   GFRNONAA >60 11/21/2018 1630   GFRAA >60 11/21/2018 1630       ASSESSMENT AND PLAN  Multiple sclerosis, relapsing-remitting (HCC)  Gait disturbance  Urinary dysfunction  Depression with anxiety  Other fatigue   1.    Continue Aubagio.       2.   Continue  oxybutynin for bladder -- if she notes hesitancy worsens, we can hold.  Continue meloxicam and flexeril as needed for LBP.     COntinue vit D supplements.  3.   Continue to be active and exercise as tolerated. 4.   She has bloodwork today to further evaluate the possible abscess.    Aubagio is unlikely to contribute to a bacterial infection but it can be held for a few weeks if her other doctors prefer.   5.    She will return to see me in 6 months or sooner if there are new or worsening neurologic symptoms.     Dredyn Gubbels A. Felecia Shelling, MD, PhD [id number], [id number] PM Certified in Neurology, Clinical Neurophysiology, Sleep Medicine, Pain Medicine and Neuroimaging  [facility] [address], [place], [place] [address] [phone]

## 2019-03-10 ENCOUNTER — Other Ambulatory Visit: Payer: Self-pay

## 2019-03-10 ENCOUNTER — Telehealth: Payer: Self-pay | Admitting: Gastroenterology

## 2019-03-10 DIAGNOSIS — R1013 Epigastric pain: Secondary | ICD-10-CM

## 2019-03-10 DIAGNOSIS — R935 Abnormal findings on diagnostic imaging of other abdominal regions, including retroperitoneum: Secondary | ICD-10-CM

## 2019-03-10 DIAGNOSIS — D649 Anemia, unspecified: Secondary | ICD-10-CM

## 2019-03-10 DIAGNOSIS — Z8719 Personal history of other diseases of the digestive system: Secondary | ICD-10-CM

## 2019-03-10 DIAGNOSIS — R112 Nausea with vomiting, unspecified: Secondary | ICD-10-CM

## 2019-03-10 MED ORDER — PEG 3350-KCL-NA BICARB-NACL 420 G PO SOLR: 4000.0000 mL | Freq: Once | ORAL | 0 refills | Status: AC

## 2019-03-10 NOTE — Telephone Encounter (Signed)
Pt has questions regarding upcoming procedures.

## 2019-03-10 NOTE — Telephone Encounter (Signed)
Left message on machine to call back  

## 2019-03-10 NOTE — Telephone Encounter (Signed)
The pt returned call and she rescheduled her COVID test until later in the day on 12/17.

## 2019-03-11 ENCOUNTER — Other Ambulatory Visit (INDEPENDENT_AMBULATORY_CARE_PROVIDER_SITE_OTHER): Payer: BC Managed Care – PPO

## 2019-03-11 DIAGNOSIS — D649 Anemia, unspecified: Secondary | ICD-10-CM | POA: Diagnosis not present

## 2019-03-11 LAB — IBC + FERRITIN
Ferritin: 41.8 ng/mL (ref 10.0–291.0)
Iron: 68 ug/dL (ref 42–145)
Saturation Ratios: 19 % — ABNORMAL LOW (ref 20.0–50.0)
Transferrin: 255 mg/dL (ref 212.0–360.0)

## 2019-03-11 LAB — FOLATE: Folate: >24.1 ng/mL (ref >5.9)

## 2019-03-11 LAB — VITAMIN B12: Vitamin B-12: 235 pg/mL (ref 211–911)

## 2019-03-17 ENCOUNTER — Other Ambulatory Visit (HOSPITAL_COMMUNITY)
Admission: RE | Admit: 2019-03-17 | Discharge: 2019-03-17 | Disposition: A | Discharge status: Home / Self Care | Payer: BC Managed Care – PPO | Source: Ambulatory Visit | Attending: Gastroenterology | Admitting: Gastroenterology

## 2019-03-17 ENCOUNTER — Other Ambulatory Visit (HOSPITAL_COMMUNITY): Payer: BC Managed Care – PPO

## 2019-03-17 DIAGNOSIS — Z01812 Encounter for preprocedural laboratory examination: Secondary | ICD-10-CM | POA: Diagnosis present

## 2019-03-17 DIAGNOSIS — Z20828 Contact with and (suspected) exposure to other viral communicable diseases: Secondary | ICD-10-CM | POA: Insufficient documentation

## 2019-03-18 ENCOUNTER — Other Ambulatory Visit: Payer: Self-pay

## 2019-03-18 ENCOUNTER — Encounter (HOSPITAL_COMMUNITY): Payer: Self-pay | Admitting: Gastroenterology

## 2019-03-18 LAB — NOVEL CORONAVIRUS, NAA (HOSP ORDER, SEND-OUT TO REF LAB; TAT 18-24 HRS): SARS-CoV-2, NAA: NOT DETECTED

## 2019-03-21 ENCOUNTER — Ambulatory Visit (HOSPITAL_COMMUNITY): Payer: BC Managed Care – PPO | Admitting: Anesthesiology

## 2019-03-21 ENCOUNTER — Other Ambulatory Visit: Payer: Self-pay

## 2019-03-21 ENCOUNTER — Encounter (HOSPITAL_COMMUNITY): Payer: Self-pay | Admitting: Gastroenterology

## 2019-03-21 ENCOUNTER — Ambulatory Visit (HOSPITAL_COMMUNITY)
Admission: RE | Admit: 2019-03-21 | Discharge: 2019-03-21 | Disposition: A | Discharge status: Home / Self Care | Payer: BC Managed Care – PPO | Attending: Gastroenterology | Admitting: Gastroenterology

## 2019-03-21 ENCOUNTER — Encounter (HOSPITAL_COMMUNITY)
Admission: RE | Disposition: A | Discharge status: Home / Self Care | Payer: Self-pay | Source: Home / Self Care | Attending: Gastroenterology

## 2019-03-21 DIAGNOSIS — K5732 Diverticulitis of large intestine without perforation or abscess without bleeding: Secondary | ICD-10-CM | POA: Insufficient documentation

## 2019-03-21 DIAGNOSIS — R1013 Epigastric pain: Secondary | ICD-10-CM

## 2019-03-21 DIAGNOSIS — Z8719 Personal history of other diseases of the digestive system: Secondary | ICD-10-CM

## 2019-03-21 DIAGNOSIS — F418 Other specified anxiety disorders: Secondary | ICD-10-CM | POA: Insufficient documentation

## 2019-03-21 DIAGNOSIS — K3189 Other diseases of stomach and duodenum: Secondary | ICD-10-CM | POA: Insufficient documentation

## 2019-03-21 DIAGNOSIS — Z87891 Personal history of nicotine dependence: Secondary | ICD-10-CM | POA: Insufficient documentation

## 2019-03-21 DIAGNOSIS — J45909 Unspecified asthma, uncomplicated: Secondary | ICD-10-CM | POA: Diagnosis not present

## 2019-03-21 DIAGNOSIS — K5730 Diverticulosis of large intestine without perforation or abscess without bleeding: Secondary | ICD-10-CM | POA: Diagnosis not present

## 2019-03-21 DIAGNOSIS — K640 First degree hemorrhoids: Secondary | ICD-10-CM | POA: Insufficient documentation

## 2019-03-21 DIAGNOSIS — I10 Essential (primary) hypertension: Secondary | ICD-10-CM | POA: Diagnosis not present

## 2019-03-21 DIAGNOSIS — K621 Rectal polyp: Secondary | ICD-10-CM

## 2019-03-21 DIAGNOSIS — R112 Nausea with vomiting, unspecified: Secondary | ICD-10-CM

## 2019-03-21 DIAGNOSIS — D128 Benign neoplasm of rectum: Secondary | ICD-10-CM | POA: Diagnosis not present

## 2019-03-21 DIAGNOSIS — Z882 Allergy status to sulfonamides status: Secondary | ICD-10-CM | POA: Diagnosis not present

## 2019-03-21 DIAGNOSIS — D509 Iron deficiency anemia, unspecified: Secondary | ICD-10-CM | POA: Diagnosis present

## 2019-03-21 DIAGNOSIS — R935 Abnormal findings on diagnostic imaging of other abdominal regions, including retroperitoneum: Secondary | ICD-10-CM

## 2019-03-21 DIAGNOSIS — D649 Anemia, unspecified: Secondary | ICD-10-CM

## 2019-03-21 DIAGNOSIS — G35 Multiple sclerosis: Secondary | ICD-10-CM | POA: Insufficient documentation

## 2019-03-21 DIAGNOSIS — R933 Abnormal findings on diagnostic imaging of other parts of digestive tract: Secondary | ICD-10-CM | POA: Insufficient documentation

## 2019-03-21 HISTORY — PX: SCLEROTHERAPY: SHX6841

## 2019-03-21 HISTORY — DX: Unspecified asthma, uncomplicated: J45.909

## 2019-03-21 HISTORY — PX: ESOPHAGOGASTRODUODENOSCOPY (EGD) WITH PROPOFOL: SHX5813

## 2019-03-21 HISTORY — PX: COLONOSCOPY WITH PROPOFOL: SHX5780

## 2019-03-21 HISTORY — PX: ENDOSCOPIC MUCOSAL RESECTION: SHX6839

## 2019-03-21 HISTORY — PX: BIOPSY: SHX5522

## 2019-03-21 HISTORY — PX: HEMOSTASIS CLIP PLACEMENT: SHX6857

## 2019-03-21 SURGERY — COLONOSCOPY WITH PROPOFOL
Anesthesia: Monitor Anesthesia Care

## 2019-03-21 MED ORDER — LACTATED RINGERS IV SOLN: INTRAVENOUS | Status: DC

## 2019-03-21 MED ORDER — PROPOFOL 500 MG/50ML IV EMUL
INTRAVENOUS | Status: DC | PRN
  Administered 2019-03-21: 100 ug/kg/min via INTRAVENOUS

## 2019-03-21 MED ORDER — SODIUM CHLORIDE 0.9 % IV SOLN: INTRAVENOUS | Status: DC

## 2019-03-21 MED ORDER — LIDOCAINE HCL URETHRAL/MUCOSAL 2 % EX GEL
CUTANEOUS | Status: AC
  Filled 2019-03-21: qty 20

## 2019-03-21 MED ORDER — LIDOCAINE HCL (CARDIAC) PF 100 MG/5ML IV SOSY
PREFILLED_SYRINGE | INTRAVENOUS | Status: DC | PRN
  Administered 2019-03-21: 60 mg via INTRATRACHEAL

## 2019-03-21 MED ORDER — PROPOFOL 10 MG/ML IV BOLUS
INTRAVENOUS | Status: DC | PRN
  Administered 2019-03-21: 20 mg via INTRAVENOUS
  Administered 2019-03-21: 30 mg via INTRAVENOUS

## 2019-03-21 MED ORDER — DEXMEDETOMIDINE HCL 200 MCG/2ML IV SOLN
INTRAVENOUS | Status: DC | PRN
  Administered 2019-03-21: 8 ug via INTRAVENOUS

## 2019-03-21 MED ORDER — OMEPRAZOLE 40 MG PO CPDR: 40.0000 mg | DELAYED_RELEASE_CAPSULE | Freq: Two times a day (BID) | ORAL | 1 refills | Status: DC

## 2019-03-21 SURGICAL SUPPLY — 24 items

## 2019-03-21 NOTE — Transfer of Care (Signed)
Immediate Anesthesia Transfer of Care Note  Patient: Sandra Bonilla  Procedure(s) Performed: COLONOSCOPY WITH PROPOFOL (N/A ) ESOPHAGOGASTRODUODENOSCOPY (EGD) WITH PROPOFOL (N/A ) BIOPSY POLYPECTOMY SCLEROTHERAPY HOT HEMOSTASIS (ARGON PLASMA COAGULATION/BICAP) (N/A ) HEMOSTASIS CLIP PLACEMENT  Patient Location: Endoscopy Unit  Anesthesia Type:MAC  Level of Consciousness: drowsy and patient cooperative  Airway & Oxygen Therapy: Patient Spontanous Breathing and Patient connected to nasal cannula oxygen  Post-op Assessment: Report given to RN and Post -op Vital signs reviewed and stable  Post vital signs: Reviewed and stable  Last Vitals:  Vitals Value Taken Time  BP 109/70 03/21/19 1530  Temp    Pulse 70 03/21/19 1532  Resp 13 03/21/19 1532  SpO2 95 % 03/21/19 1532  Vitals shown include unvalidated device data.  Last Pain:  Vitals:   03/21/19 1509  TempSrc: Temporal  PainSc: 0-No pain         Complications: No apparent anesthesia complications

## 2019-03-21 NOTE — Op Note (Signed)
[facility] Patient Name: Sandra Bonilla Procedure Date : 03/21/2019 MRN: [id number] Attending MD: Justice Britain , MD Date of Birth: Aug 07, 1975 CSN: [id number] Age: 43 Admit Type: Inpatient Procedure:                Upper GI endoscopy Indications:              Iron deficiency anemia Providers:                Justice Britain, MD, Carlyn Reichert, RN, Benay Pillow, RN, Lazaro Arms, Technician Referring MD:             [name], MD, Venetia Maxon. Elijio Miles, MD Medicines:                Monitored Anesthesia Care Complications:            No immediate complications. Estimated Blood Loss:     Estimated blood loss was minimal. Procedure:                Pre-Anesthesia Assessment:                           - Prior to the procedure, a History and Physical                            was performed, and patient medications and                            allergies were reviewed. The patient's tolerance of                            previous anesthesia was also reviewed. The risks                            and benefits of the procedure and the sedation                            options and risks were discussed with the patient.                            All questions were answered, and informed consent                            was obtained. Prior Anticoagulants: The patient has                            taken no previous anticoagulant or antiplatelet                            agents. ASA Grade Assessment: II - A patient with                            mild systemic disease. After reviewing the risks  and benefits, the patient was deemed in                            satisfactory condition to undergo the procedure.                           After obtaining informed consent, the endoscope was                            passed under direct vision. Throughout the                            procedure, the patient's blood  pressure, pulse, and                            oxygen saturations were monitored continuously. The                            [id number]) Olympus gastroscope was                            introduced through the mouth, and advanced to the                            second part of duodenum. The upper GI endoscopy was                            accomplished without difficulty. The patient                            tolerated the procedure. Scope In: Scope Out: Findings:      No gross lesions were noted in the entire esophagus.      The Z-line was regular and was found 37 cm from the incisors.      Multiple dispersed small nodular erosions with no bleeding and no       stigmata of recent bleeding were found in the gastric antrum.      No other gross lesions were noted in the entire examined stomach.       Biopsies were taken with a cold forceps for histology and Helicobacter       pylori testing.      No gross lesions were noted in the duodenal bulb, in the first portion       of the duodenum and in the second portion of the duodenum. Biopsies for       histology were taken with a cold forceps for evaluation of celiac       disease. Impression:               - No gross lesions in esophagus. Z-line regular, 37                            cm from the incisors.                           - Erosive nodular gastropathy with no bleeding and  no stigmata of recent bleeding. Biopsied for HP.                           - No gross lesions in the duodenal bulb, in the                            first portion of the duodenum and in the second                            portion of the duodenum. Biopsied for Celiac. Recommendation:           - Proceed to scheduled colonoscopy.                           - Observe patient's clinical course.                           - Await pathology results. If positive for HP will                            need treatment.                            - Increase Omeprazole to 40 mg twice daily x                            64-months and then decrease back to once daily.                           - The findings and recommendations were discussed                            with the patient.                           - The findings and recommendations were discussed                            with the patient's family. Procedure Code(s):        --- Professional ---                           [id number], Esophagogastroduodenoscopy, flexible,                            transoral; with biopsy, single or multiple Diagnosis Code(s):        --- Professional ---                           K31.89, Other diseases of stomach and duodenum                           D50.9, Iron deficiency anemia, unspecified CPT copyright 2019 American Medical Association. All rights reserved. The codes documented in this report are preliminary and upon coder review may  be revised to meet  current compliance requirements. Justice Britain, MD 03/21/2019 3:18:16 PM Number of Addenda: 0

## 2019-03-21 NOTE — Anesthesia Procedure Notes (Signed)
Procedure Name: MAC Date/Time: 03/21/2019 2:07 PM Performed by: Kathryne Hitch, CRNA Pre-anesthesia Checklist: Patient identified, Emergency Drugs available, Suction available, Patient being monitored and Timeout performed Patient Re-evaluated:Patient Re-evaluated prior to induction Oxygen Delivery Method: Nasal cannula Preoxygenation: Pre-oxygenation with 100% oxygen Induction Type: IV induction Dental Injury: Teeth and Oropharynx as per pre-operative assessment

## 2019-03-21 NOTE — H&P (Signed)
GASTROENTEROLOGY PROCEDURE H&P NOTE   Primary Care Physician: [name], MD  HPI: Sandra Bonilla is a 43 y.o. female who presents for EGD/Colonoscopy.  Past Medical History:  Diagnosis Date  . Anxiety   . Asthma   . Cervical high risk human papillomavirus (HPV) DNA test positive 09/2015  . Cholecystitis 2006   GALBLADDER REMOVED  . Depression   . Diverticulitis   . Diverticulosis   . 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 History of mammogram 11/2014   FIBROADENOMA  . History of Papanicolaou smear of cervix 01/24/13; 10/15/15   -/-; -/+  . Hypertension   . Menorrhagia 02/01/2010   D/C HYSTEROSCOPY ENDO POLYP  . MS (multiple sclerosis) ([place])   . Ovarian cyst 9/15; 2015-2016   LEFT - WENT TO [facility]; RIGHT  . 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 BREAST BIOPSY Right 2016   Fibroadenoma  . CHOLECYSTECTOMY  2006  . DILATION AND CURETTAGE, DIAGNOSTIC / THERAPEUTIC  02/01/2010   D/C HYSTEROSCOPY ENDO POLYP; [facility]  . EYE SURGERY     Lasik  . FLEXIBLE SIGMOIDOSCOPY  07/31/2016   Procedure: FLEXIBLE SIGMOIDOSCOPY;  Surgeon: [name], MD;  Location: [facility];  Service: Endoscopy;;  . HYSTEROSCOPY  02/01/2010   ENDO POLYP - [facility]  . IR RADIOLOGIST EVAL & MGMT  11/16/2018   Current Facility-Administered Medications  Medication Dose Route Frequency Provider Last Rate Last Admin  . lactated ringers infusion   Intravenous Continuous Mansouraty, Telford Nab., MD 20 mL/hr at 03/21/19 1249 New Bag at 03/21/19 1249   Allergies  Allergen Reactions  . Sulfamethoxazole-Trimethoprim Rash and Hives  . Sulfa Antibiotics Other (See Comments)   Family History  Problem Relation Age of Onset  . Breast cancer Maternal Aunt 50       has contact  . Rheum arthritis Mother   . Hypertension Mother   . Thyroid disease Mother        HYPOTHYROIDISM  . Lung cancer Mother 22  . Heart disease Father   . Diabetes  Father   . Hypertension Father   . Cancer Paternal Grandmother        MELANOMA OF SKIN  . Cancer Cousin        KIDNEY-COUSIN  . Colon cancer Neg Hx   . Stomach cancer Neg Hx   . Pancreatic cancer Neg Hx   . Esophageal cancer Neg Hx   . Inflammatory bowel disease Neg Hx   . Rectal cancer Neg Hx    Social History   Socioeconomic History  . Marital status: Divorced    Spouse name: Not on file  . Number of children: 0  . Years of education: 36  . Highest education level: Not on file  Occupational History    Employer: [employer]  Tobacco Use  . Smoking status: Former Smoker    Years: 10.00    Quit date: 2010    Years since quitting: 10.9  . Smokeless tobacco: Never Used  Substance and Sexual Activity  . Alcohol use: No  . Drug use: No  . Sexual activity: Not Currently    Partners: Male    Birth control/protection: I.U.D.    Comment: Mirena  Other Topics Concern  . Not on file  Social History Narrative  . Not on file   Social Determinants of Health   Financial Resource Strain:   . Difficulty of Paying Living Expenses: Not on file  Food Insecurity:   . Worried About Charity fundraiser in the Last Year: Not on file  . Ran Out of Food in the Last Year: Not on file  Transportation Needs:   . Lack of Transportation (Medical): Not on file  . Lack of Transportation (Non-Medical): Not on file  Physical Activity:   . Days of Exercise per Week: Not on file  . Minutes of Exercise per Session: Not on file  Stress:   . Feeling of Stress : Not on file  Social Connections:   . Frequency of Communication with Friends and Family: Not on file  . Frequency of Social Gatherings with Friends and Family: Not on file  . Attends Religious Services: Not on file  . Active Member of Clubs or Organizations: Not on file  . Attends Archivist Meetings: Not on file  . Marital Status: Not on file  Intimate Partner Violence:   . Fear of Current or Ex-Partner: Not on file  .  Emotionally Abused: Not on file  . Physically Abused: Not on file  . Sexually Abused: Not on file    Physical Exam: Vital signs in last 24 hours: Pulse Rate:  [81] 81 (12/21 1228) Resp:  [16] 16 (12/21 1228) BP: (132)/(86) 132/86 (12/21 1228) Weight:  [95.3 kg] 95.3 kg (12/21 1228)   GEN: NAD EYE: Sclerae anicteric ENT: MMM CV: Non-tachycardic GI: Soft, NT/ND NEURO:  Alert & Oriented x 3  Lab Results: No results for input(s): WBC, HGB, HCT, PLT in the last 72 hours. BMET No results for input(s): NA, K, CL, CO2, GLUCOSE, BUN, CREATININE, CALCIUM in the last 72 hours. LFT No results for input(s): PROT, ALBUMIN, AST, ALT, ALKPHOS, BILITOT, BILIDIR, IBILI in the last 72 hours. PT/INR No results for input(s): LABPROT, INR in the last 72 hours.   Impression / Plan: This is a 43 y.o.female who presents for EGD/Colonoscopy.  The risks and benefits of endoscopic evaluation were discussed with the patient; these include but are not limited to the risk of perforation, infection, bleeding, missed lesions, lack of diagnosis, severe illness requiring hospitalization, as well as anesthesia and sedation related illnesses.  The patient is agreeable to proceed.    Justice Britain, MD [facility] # [phone]

## 2019-03-21 NOTE — H&P (Signed)
Please see Consult note. Was titled incorrectly.  Justice Britain, MD [facility] # [phone]

## 2019-03-21 NOTE — Anesthesia Preprocedure Evaluation (Addendum)
Anesthesia Evaluation  Patient identified by MRN, date of birth, ID band Patient awake    Reviewed: Allergy & Precautions, NPO status , Patient's Chart, lab work & pertinent test results  History of Anesthesia Complications Negative for: history of anesthetic complications  Airway Mallampati: III  TM Distance: >3 FB Neck ROM: Full    Dental  (+) Dental Advisory Given, Teeth Intact   Pulmonary asthma , former smoker,    Pulmonary exam normal        Cardiovascular hypertension, Pt. on medications Normal cardiovascular exam     Neuro/Psych  Headaches, PSYCHIATRIC DISORDERS Anxiety Depression  Neuromuscular disease (multiple sclerosis)    GI/Hepatic negative GI ROS, Neg liver ROS,   Endo/Other   Obesity   Renal/GU negative Renal ROS     Musculoskeletal negative musculoskeletal ROS (+)   Abdominal (+) + obese,   Peds  Hematology negative hematology ROS (+)   Anesthesia Other Findings Covid neg 12/17   Reproductive/Obstetrics                            Anesthesia Physical Anesthesia Plan  ASA: III  Anesthesia Plan: MAC   Post-op Pain Management:    Induction: Intravenous  PONV Risk Score and Plan: 2 and Propofol infusion and Treatment may vary due to age or medical condition  Airway Management Planned: Nasal Cannula and Natural Airway  Additional Equipment: None  Intra-op Plan:   Post-operative Plan:   Informed Consent: I have reviewed the patients History and Physical, chart, labs and discussed the procedure including the risks, benefits and alternatives for the proposed anesthesia with the patient or authorized representative who has indicated his/her understanding and acceptance.       Plan Discussed with: CRNA and Anesthesiologist  Anesthesia Plan Comments:        Anesthesia Quick Evaluation

## 2019-03-21 NOTE — Op Note (Signed)
[facility] Patient Name: Sandra Bonilla Procedure Date : 03/21/2019 MRN: [id number] Attending MD: Justice Britain , MD Date of Birth: [birth year] CSN: [id number] Age: 43 Admit Type: Inpatient Procedure:                Colonoscopy Indications:              Evaluation of abnormal imaging study (likely to be                            clinically significant), Unexplained iron                            deficiency anemia, Personal history of digestive                            disease, Abnormal CT of the GI tract, Preoperative                            assessment, Follow-up of diverticulitis Providers:                Justice Britain, MD, Carlyn Reichert, RN, Benay Pillow, RN, Lazaro Arms, Technician Referring MD:             [name], MD, Venetia Maxon. Elijio Miles, MD Medicines:                Monitored Anesthesia Care Complications:            No immediate complications. Estimated Blood Loss:     Estimated blood loss was minimal. Procedure:                Pre-Anesthesia Assessment:                           - Prior to the procedure, a History and Physical                            was performed, and patient medications and                            allergies were reviewed. The patient's tolerance of                            previous anesthesia was also reviewed. The risks                            and benefits of the procedure and the sedation                            options and risks were discussed with the patient.                            All questions were answered, and informed consent  was obtained. Prior Anticoagulants: The patient has                            taken no previous anticoagulant or antiplatelet                            agents. ASA Grade Assessment: II - A patient with                            mild systemic disease. After reviewing the risks                            and benefits,  the patient was deemed in                            satisfactory condition to undergo the procedure.                           - Prior to the procedure, a History and Physical                            was performed, and patient medications and                            allergies were reviewed. The patient's tolerance of                            previous anesthesia was also reviewed. The risks                            and benefits of the procedure and the sedation                            options and risks were discussed with the patient.                            All questions were answered, and informed consent                            was obtained. Prior Anticoagulants: The patient has                            taken no previous anticoagulant or antiplatelet                            agents. ASA Grade Assessment: II - A patient with                            mild systemic disease. After reviewing the risks                            and benefits, the patient was deemed in  satisfactory condition to undergo the procedure.                           After obtaining informed consent, the colonoscope                            was passed under direct vision. Throughout the                            procedure, the patient's blood pressure, pulse, and                            oxygen saturations were monitored continuously. The                            [id number]-PH190L ([id number]) Olympus ultra slim colonoscope                            was introduced through the anus and advanced to the                            6 cm into the ileum. The colonoscopy was performed                            without difficulty. The patient tolerated the                            procedure. The quality of the bowel preparation was                            good. The terminal ileum, ileocecal valve,                            appendiceal orifice, and rectum were  photographed. Scope In: 2:30:15 PM Scope Out: 2:59:40 PM Scope Withdrawal Time: 0 hours 25 minutes 55 seconds  Total Procedure Duration: 0 hours 29 minutes 25 seconds  Findings:      The digital rectal exam findings include palpable rectal lesion at 12       o'clock noted (small).      The terminal ileum and ileocecal valve appeared normal.      Multiple small-mouthed diverticula were found in the recto-sigmoid       colon, sigmoid colon and descending colon.      A 18 mm polyp was found in the distal rectum just above the dentate       line. The polyp was semi-pedunculated. Preparations were made for       mucosal resection. Orise gel was injected to raise the lesion. Snare       mucosal resection was performed. Resection and retrieval were complete.       Fulguration to ablate the resection margin by snare tip was successful.       To prevent bleeding after mucosal resection, five hemostatic clips were       successfully placed (MR conditional). There was no bleeding during, or       at the end, of the procedure.  Normal mucosa was found in the entire colon otherwise.      Non-bleeding non-thrombosed internal hemorrhoids were found during       retroflexion, during perianal exam and during digital exam. The       hemorrhoids were Grade I (internal hemorrhoids that do not prolapse). Impression:               - Palpable rectal lesionmass found on digital                            rectal exam.                           - The examined portion of the ileum was normal.                           - Diverticulosis in the recto-sigmoid colon, in the                            sigmoid colon and in the descending colon.                           - One 18 mm polyp in the distal rectum, removed                            with mucosal resection. Resected and retrieved.                            Treated with a hot snare tip to the edge of                            resection. Clips (MR  conditional) were placed.                           - Normal mucosa in the entire examined colon                            otherwise.                           - Non-bleeding non-thrombosed internal hemorrhoids. Recommendation:           - The patient will be observed post-procedure,                            until all discharge criteria are met.                           - Discharge patient to home.                           - Patient has a contact number available for                            emergencies. The signs and symptoms of potential  delayed complications were discussed with the                            patient. Return to normal activities tomorrow.                            Written discharge instructions were provided to the                            patient.                           - High fiber diet.                           - No aspirin, ibuprofen, naproxen, or other                            non-steroidal anti-inflammatory drugs for 1 week to                            decrease risk of bleeding post-intervention.                           - Continue present medications.                           - Await pathology results.                           - Repeat colonoscopy in likely 3 years for                            surveillance based on pathology results if evidence                            of negative margins otherwise would return back for                            Flexible sigmoidoscopy in 6-12 months.                           - Recticare over-the-counter Lidocaine jelly vs                            Preparation H Lidocaine tissues may be helpful if                            discomfort is occuring in the rectum.                           - Monitor for signs/symptoms of bleeding,                            perforation, and infection. If issues please call  our number to get further assistance as  needed.                           - The findings and recommendations were discussed                            with the patient.                           - The findings and recommendations were discussed                            with the patient's family. Procedure Code(s):        --- Professional ---                           [phone], Colonoscopy, flexible; with endoscopic                            mucosal resection Diagnosis Code(s):        --- Professional ---                           K62.89, Other specified diseases of anus and rectum                           K64.0, First degree hemorrhoids                           K62.1, Rectal polyp                           R93.3, Abnormal findings on diagnostic imaging of                            other parts of digestive tract                           D50.9, Iron deficiency anemia, unspecified                           Z87.19, Personal history of other diseases of the                            digestive system                           Z01.818, Encounter for other preprocedural                            examination                           K57.32, Diverticulitis of large intestine without                            perforation or abscess without bleeding  K57.30, Diverticulosis of large intestine without                            perforation or abscess without bleeding CPT copyright 2019 American Medical Association. All rights reserved. The codes documented in this report are preliminary and upon coder review may  be revised to meet current compliance requirements. Justice Britain, MD 03/21/2019 3:38:32 PM Number of Addenda: 0

## 2019-03-21 NOTE — Discharge Instructions (Signed)
YOU HAD AN ENDOSCOPIC PROCEDURE TODAY: Refer to the procedure report and other information in the discharge instructions given to you for any specific questions about what was found during the examination. If this information does not answer your questions, please call [place] office at [phone] to clarify.  ° °YOU SHOULD EXPECT: Some feelings of bloating in the abdomen. Passage of more gas than usual. Walking can help get rid of the air that was put into your GI tract during the procedure and reduce the bloating. If you had a lower endoscopy (such as a colonoscopy or flexible sigmoidoscopy) you may notice spotting of blood in your stool or on the toilet paper. Some abdominal soreness may be present for a day or two, also. ° °DIET: Your first meal following the procedure should be a light meal and then it is ok to progress to your normal diet. A half-sandwich or bowl of soup is an example of a good first meal. Heavy or fried foods are harder to digest and may make you feel nauseous or bloated. Drink plenty of fluids but you should avoid alcoholic beverages for 24 hours. If you had a esophageal dilation, please see attached instructions for diet.   ° °ACTIVITY: Your care partner should take you home directly after the procedure. You should plan to take it easy, moving slowly for the rest of the day. You can resume normal activity the day after the procedure however YOU SHOULD NOT DRIVE, use power tools, machinery or perform tasks that involve climbing or major physical exertion for 24 hours (because of the sedation medicines used during the test).  ° °SYMPTOMS TO REPORT IMMEDIATELY: °A gastroenterologist can be reached at any hour. Please call [phone]  for any of the following symptoms:  °Following lower endoscopy (colonoscopy, flexible sigmoidoscopy) °Excessive amounts of blood in the stool  °Significant tenderness, worsening of abdominal pains  °Swelling of the abdomen that is new, acute  °Fever of 100° or  higher  °Following upper endoscopy (EGD, EUS, ERCP, esophageal dilation) °Vomiting of blood or coffee ground material  °New, significant abdominal pain  °New, significant chest pain or pain under the shoulder blades  °Painful or persistently difficult swallowing  °New shortness of breath  °Black, tarry-looking or red, bloody stools ° °FOLLOW UP:  °If any biopsies were taken you will be contacted by phone or by letter within the next 1-3 weeks. Call [phone]  if you have not heard about the biopsies in 3 weeks.  °Please also call with any specific questions about appointments or follow up tests. ° °

## 2019-03-22 NOTE — Anesthesia Postprocedure Evaluation (Signed)
Anesthesia Post Note  Patient: PLEASANT BRITZ  Procedure(s) Performed: COLONOSCOPY WITH PROPOFOL (N/A ) ESOPHAGOGASTRODUODENOSCOPY (EGD) WITH PROPOFOL (N/A ) BIOPSY POLYPECTOMY SCLEROTHERAPY HOT HEMOSTASIS (ARGON PLASMA COAGULATION/BICAP) (N/A ) HEMOSTASIS CLIP PLACEMENT     Patient location during evaluation: PACU Anesthesia Type: MAC Level of consciousness: awake and alert Pain management: pain level controlled Vital Signs Assessment: post-procedure vital signs reviewed and stable Respiratory status: spontaneous breathing, nonlabored ventilation and respiratory function stable Cardiovascular status: stable and blood pressure returned to baseline Anesthetic complications: no    Last Vitals:  Vitals:   03/21/19 1520 03/21/19 1530  BP:  109/70  Pulse: 64 65  Resp: (!) 25 16  SpO2: 100% 100%    Last Pain:  Vitals:   03/21/19 1509  TempSrc: Temporal  PainSc: 0-No pain   Pain Goal:                   Audry Pili

## 2019-03-23 ENCOUNTER — Other Ambulatory Visit: Payer: Self-pay | Admitting: Physician Assistant

## 2019-03-23 LAB — SURGICAL PATHOLOGY

## 2019-03-28 ENCOUNTER — Encounter: Payer: Self-pay | Admitting: Gastroenterology

## 2019-03-29 ENCOUNTER — Other Ambulatory Visit: Payer: Self-pay

## 2019-03-29 DIAGNOSIS — D649 Anemia, unspecified: Secondary | ICD-10-CM

## 2019-04-13 ENCOUNTER — Other Ambulatory Visit: Payer: Self-pay | Admitting: *Deleted

## 2019-04-13 MED ORDER — OXYBUTYNIN CHLORIDE 5 MG PO TABS: ORAL_TABLET | ORAL | 3 refills | Status: DC

## 2019-05-01 ENCOUNTER — Other Ambulatory Visit: Payer: Self-pay | Admitting: Gastroenterology

## 2019-05-02 DIAGNOSIS — D649 Anemia, unspecified: Secondary | ICD-10-CM

## 2019-05-02 HISTORY — DX: Anemia, unspecified: D64.9

## 2019-05-02 NOTE — Telephone Encounter (Signed)
Refill sent.

## 2019-05-03 ENCOUNTER — Other Ambulatory Visit: Payer: Self-pay | Admitting: Gastroenterology

## 2019-05-06 ENCOUNTER — Other Ambulatory Visit (INDEPENDENT_AMBULATORY_CARE_PROVIDER_SITE_OTHER): Payer: BC Managed Care – PPO

## 2019-05-06 DIAGNOSIS — D649 Anemia, unspecified: Secondary | ICD-10-CM

## 2019-05-06 LAB — CBC WITH DIFFERENTIAL/PLATELET
Basophils Absolute: 0.1 10*3/uL (ref 0.0–0.1)
Basophils Relative: 1 % (ref 0.0–3.0)
Eosinophils Absolute: 0.2 10*3/uL (ref 0.0–0.7)
Eosinophils Relative: 2.3 % (ref 0.0–5.0)
HCT: 34.9 % — ABNORMAL LOW (ref 36.0–46.0)
Hemoglobin: 11.2 g/dL — ABNORMAL LOW (ref 12.0–15.0)
Lymphocytes Relative: 26.2 % (ref 12.0–46.0)
Lymphs Abs: 1.8 10*3/uL (ref 0.7–4.0)
MCHC: 32 g/dL (ref 30.0–36.0)
MCV: 79.2 fl (ref 78.0–100.0)
Monocytes Absolute: 0.8 10*3/uL (ref 0.1–1.0)
Monocytes Relative: 11.3 % (ref 3.0–12.0)
Neutro Abs: 4 10*3/uL (ref 1.4–7.7)
Neutrophils Relative %: 59.2 % (ref 43.0–77.0)
Platelets: 378 10*3/uL (ref 150.0–400.0)
RBC: 4.4 Mil/uL (ref 3.87–5.11)
RDW: 17.4 % — ABNORMAL HIGH (ref 11.5–15.5)
WBC: 6.7 10*3/uL (ref 4.0–10.5)

## 2019-05-06 LAB — IBC + FERRITIN
Ferritin: 107.6 ng/mL (ref 10.0–291.0)
Iron: 36 ug/dL — ABNORMAL LOW (ref 42–145)
Saturation Ratios: 13.4 % — ABNORMAL LOW (ref 20.0–50.0)
Transferrin: 192 mg/dL — ABNORMAL LOW (ref 212.0–360.0)

## 2019-05-09 ENCOUNTER — Other Ambulatory Visit: Payer: Self-pay

## 2019-05-09 DIAGNOSIS — D649 Anemia, unspecified: Secondary | ICD-10-CM

## 2019-05-13 ENCOUNTER — Ambulatory Visit (HOSPITAL_COMMUNITY)
Admission: RE | Admit: 2019-05-13 | Discharge: 2019-05-13 | Disposition: A | Discharge status: Home / Self Care | Payer: Self-pay | Source: Ambulatory Visit | Attending: Internal Medicine | Admitting: Internal Medicine

## 2019-05-13 ENCOUNTER — Other Ambulatory Visit: Payer: Self-pay

## 2019-05-13 DIAGNOSIS — D649 Anemia, unspecified: Secondary | ICD-10-CM | POA: Insufficient documentation

## 2019-05-13 MED ORDER — SODIUM CHLORIDE 0.9 % IV SOLN
INTRAVENOUS | Status: DC | PRN
  Administered 2019-05-13: 250 mL via INTRAVENOUS

## 2019-05-13 MED ORDER — SODIUM CHLORIDE 0.9 % IV SOLN
510.0000 mg | INTRAVENOUS | Status: DC
  Administered 2019-05-13: 10:00:00 510 mg via INTRAVENOUS
  Filled 2019-05-13: qty 17

## 2019-05-13 NOTE — Discharge Instructions (Signed)

## 2019-05-13 NOTE — Progress Notes (Signed)
PATIENT CARE CENTER NOTE  Diagnosis: Anemia, unspecified type (D64.9)   Provider: Justice Britain, MD   Procedure: Feraheme infusion   Note: Patient received Feraheme via PIV. Tolerated well with no adverse reaction. Observed patient for 30 minutes post-infusion. Vital signs stable. Discharge instructions given. Patient to come back next week for second Feraheme infusion. Alert, oriented and ambulatory at discharge.

## 2019-05-16 ENCOUNTER — Telehealth: Payer: Self-pay | Admitting: Gastroenterology

## 2019-05-16 NOTE — Telephone Encounter (Signed)
Patty, Thanks for letting me know. Not sure about the abdominal discomfort but the potential for joint pains is something that can occur. They could be separate and nonsignificant issues but we should monitor. I think it is okay for her to move forward with her infusion as she will be monitored closely for any signs of a reaction.  If she has recurrent symptoms after this particular infusion then we should keep that in mind prior to next infusion versus changing infusion in the future. Let me know what she thinks. Thanks. GM

## 2019-05-16 NOTE — Telephone Encounter (Signed)
The pt states she has some joint pain and abd discomfort about 2 days after last iron infusion for about 24 hours.  She states she is much better now but wanted to make Dr Rush Landmark aware. She is not sure it is related but wanted to pass the information on since she has another infusion on Friday.

## 2019-05-16 NOTE — Telephone Encounter (Signed)
The pt is aware and will keep infusion as planned.

## 2019-05-18 ENCOUNTER — Other Ambulatory Visit: Payer: Self-pay | Admitting: Gastroenterology

## 2019-05-20 ENCOUNTER — Ambulatory Visit (HOSPITAL_COMMUNITY)
Admission: RE | Admit: 2019-05-20 | Discharge: 2019-05-20 | Disposition: A | Discharge status: Home / Self Care | Payer: Self-pay | Source: Ambulatory Visit | Attending: Internal Medicine | Admitting: Internal Medicine

## 2019-05-20 ENCOUNTER — Other Ambulatory Visit: Payer: Self-pay

## 2019-05-20 MED ORDER — SODIUM CHLORIDE 0.9 % IV SOLN
510.0000 mg | Freq: Once | INTRAVENOUS | Status: AC
  Administered 2019-05-20: 510 mg via INTRAVENOUS
  Filled 2019-05-20: qty 510

## 2019-05-20 MED ORDER — SODIUM CHLORIDE 0.9 % IV SOLN
INTRAVENOUS | Status: DC | PRN
  Administered 2019-05-20: 250 mL via INTRAVENOUS

## 2019-05-20 NOTE — Discharge Instructions (Signed)

## 2019-05-20 NOTE — Progress Notes (Signed)
Patient received IV Feraheme as ordered by Mansouraty Gabriel MD. Observed for at least 30 minutes post infusion.Tolerated well, vitals stable, discharge instructions given, verbalized understanding. Patient alert, oriented and ambulatory at the time of discharge.  

## 2019-06-02 ENCOUNTER — Telehealth: Payer: Self-pay | Admitting: Gastroenterology

## 2019-06-09 NOTE — Telephone Encounter (Signed)
Earlier this week, I received a radiology report noted from May 25, 2019 from [facility]  Patient underwent a CT scan of the abdomen/pelvis for history of left lower quadrant pain and previous diverticulitis with abscess This CT scan showed a postcholecystectomy clips in the right upper quadrant Liver pancreas and spleen were unremarkable No retroperitoneal mass or adenopathy or aneurysm was noted. A left adrenal adenoma 1.7 x 1.4 cm was noted right adrenal gland was unremarkable. No renal masses or renal obstruction. No evidence of appendicitis. No evidence of bowel obstruction. In the pelvis there is an area of density adjacent to the small bowel loops in the upper pelvic region having the appearance of adhesions.  In addition adjacent to this area tiny moieties of air within a low-density collection with peripheral calcification.  It is uncertain whether this represents a chronic walled off abscess or recurrent abscess.  This does appear to be connected to the sigmoid colon by a band of tissue.  This finding is best seen on series 2 image 72.  The collection suggesting chronic walled off abscess or recurrent abscess measures 2.3 x 2.9 x 1.7 cm. There is thickening of the adjacent loop of sigmoid colon with diverticulosis extending over a distance of approximately 8 cm.  No other abnormal fluid collections are noted in the pelvis.  No inguinal mass or adenopathy. Lung bases appear clear. No lytic or blastic lesions are identified.  I called and spoke with the patient about these particular findings and she describes that she had discussed with her primary care doctor that she was having some issues and so a CT scan was ordered.  These results when they return to her PCP were forwarded to myself as well as to Dr. [name].  The patient and Dr. [name] were planning a potential resection in the spring/summer of this year.  She has had a clinic  visit set up for a follow-up in the coming weeks to discuss things with Dr. [name] due to the findings of the CT scan. Overall, she is feeling okay at this time.  She was restarted on antibiotics.  I am okay with her to continue antibiotics at this time.  If things progress or the patient were to develop fevers or chills or have worsening pain she will let Dr. [name] and I know in case she would need to have another evaluation or IV antibiotics administered in the hospital.  I will forward this to Dr. [name] as well though I believe that a clinic visit has already been set up and scheduled in the coming weeks to further discuss this. Patient appreciative for the call back. We will scanned in the results into the record.  Justice Britain, MD [facility] Office # [phone]

## 2019-06-13 ENCOUNTER — Ambulatory Visit: Payer: Self-pay | Admitting: General Surgery

## 2019-06-13 NOTE — H&P (Signed)
History of Present Illness Sandra Ruff MD; 0000000 5:03 PM) The patient is a 44 year old female who presents with diverticulitis. 44 year old female with a history of MS, on medication. She was seen last in 2018 for diverticulitis. Colonoscopy was attempted, but I was unable to complete this due to significant pain. A barium enema was negative for any other pathology. She then developed recurrent pain and was hospitalized for diverticulitis with microperforation. She did end up getting a CT-guided drain within a pelvic fluid collection. CT scans were concerning for possible inflammatory bowel disease and she has quite a bit of thickened terminal ileum and fat stranding. Colonoscopy did not show any signs of IBD. Patient is now becoming more symptomatic and is having irregular bowel movements and more significant abdominal pain.   Problem List/Past Medical Sandra Ruff, MD; 0000000 5:03 PM) DIVERTICULITIS, COLON [id number])  Past Surgical History Sandra Ruff, MD; 0000000 5:03 PM) Breast Biopsy Right. Gallbladder Surgery - Laparoscopic  Diagnostic Studies History Sandra Ruff, MD; 0000000 5:03 PM) Colonoscopy never Mammogram within last year Pap Smear 1-5 years ago  Allergies Emeline Gins, [place]; 06/13/2019 4:40 PM) Septra *ANTI-INFECTIVE AGENTS - MISC.* Rash. Allergies Reconciled  Medication History Sandra Ruff, MD; 0000000 5:03 PM) Ferrous Gluconate (324 (38 Fe)MG Tablet, Oral) Active. Folic Acid (1MG  Tablet, Oral) Active. Probiotic Product (Oral) Active. Omeprazole (40MG  Capsule DR, Oral) Active. Trelegy Ellipta (100-62.5-25MCG/INH Aero Pow Br Act, Inhalation) Active. Oxybutynin Chloride (Oral) Specific strength unknown - Active. BusPIRone HCl (15MG  Tablet, Oral) Active. Claritin (10MG  Tablet, Oral) Active. Aubagio (14MG  Tablet, Oral) Active. Chlorthalidone (25MG  Tablet, Oral) Active. LamoTRIgine (200MG  Tablet, Oral)  Active. Medications Reconciled Neomycin Sulfate (500MG  Tablet, 2 (two) Oral SEE NOTE, Taken starting 06/13/2019) Active. (TAKE TWO TABLETS AT 2 PM, 3 PM, AND 10 PM THE DAY PRIOR TO SURGERY) Flagyl (500MG  Tablet, 2 (two) Oral SEE NOTE, Taken starting 06/13/2019) Active. (Take at 2pm, 3pm, and 10pm the day prior to your colon operation)  Social History Sandra Ruff, MD; 0000000 5:03 PM) Alcohol use Occasional alcohol use. Caffeine use Carbonated beverages, Tea. No drug use Tobacco use Former smoker.  Family History Sandra Ruff, MD; 0000000 5:03 PM) Arthritis Mother. Cancer Mother. Diabetes Mellitus Father. Heart Disease Father. Hypertension Father, Mother. Respiratory Condition Father. Thyroid problems Mother.  Pregnancy / Birth History Sandra Ruff, MD; 0000000 5:03 PM) Age at menarche 28 years. Contraceptive History Intrauterine device. Gravida 0 Para 0  Other Problems Sandra Ruff, MD; 0000000 5:03 PM) Anxiety Disorder Back Pain Cholelithiasis High blood pressure Kidney Stone Other disease, cancer, significant illness     Review of Systems Sandra Ruff MD; 0000000 5:05 PM) General Present- Fatigue, Night Sweats and Weight Loss. Not Present- Appetite Loss, Chills, Fever and Weight Gain. Skin Not Present- Change in Wart/Mole, Dryness, Hives, Jaundice, New Lesions, Non-Healing Wounds, Rash and Ulcer. HEENT Not Present- Earache, Hearing Loss, Hoarseness, Nose Bleed, Oral Ulcers, Ringing in the Ears, Seasonal Allergies, Sinus Pain, Sore Throat, Visual Disturbances, Wears glasses/contact lenses and Yellow Eyes. Respiratory Not Present- Bloody sputum, Chronic Cough, Difficulty Breathing, Snoring and Wheezing. Breast Not Present- Breast Mass, Breast Pain, Nipple Discharge and Skin Changes. Cardiovascular Present- Swelling of Extremities. Not Present- Chest Pain, Difficulty Breathing Lying Down, Leg Cramps, Palpitations, Rapid Heart Rate  and Shortness of Breath. Gastrointestinal Present- Abdominal Pain and Change in Bowel Habits. Not Present- Bloating, Bloody Stool, Chronic diarrhea, Constipation, Difficulty Swallowing, Excessive gas, Gets full quickly at meals, Hemorrhoids, Indigestion, Nausea, Rectal Pain and Vomiting. Musculoskeletal Present- Back Pain and Muscle Weakness.  Not Present- Joint Pain, Joint Stiffness, Muscle Pain and Swelling of Extremities. Neurological Present- Numbness and Weakness. Not Present- Decreased Memory, Fainting, Headaches, Seizures, Tingling, Tremor and Trouble walking. Psychiatric Present- Anxiety and Bipolar. Not Present- Change in Sleep Pattern, Depression, Fearful and Frequent crying.  Vitals Emeline Gins CMA; 06/13/2019 4:40 PM) 06/13/2019 4:39 PM Weight: 200.6 lb Height: 65in Body Surface Area: 1.98 m Body Mass Index: 33.38 kg/m  Temp.: 97.4F  Pulse: 66 (Regular)  BP: 126/82 (Sitting, Left Arm, Standard)        Physical Exam Sandra Ruff MD; 0000000 5:02 PM)  General Mental Status-Alert. General Appearance-Not in acute distress. Build & Nutrition-Well nourished. Posture-Normal posture. Gait-Normal.  Head and Neck Head-normocephalic, atraumatic with no lesions or palpable masses. Trachea-midline. Thyroid Gland Characteristics - normal size and consistency and no palpable nodules.  Chest and Lung Exam Chest and lung exam reveals -on auscultation, normal breath sounds, no adventitious sounds and normal vocal resonance.  Cardiovascular Cardiovascular examination reveals -normal heart sounds, regular rate and rhythm with no murmurs and no digital clubbing, cyanosis, edema, increased warmth or tenderness.  Abdomen Inspection Inspection of the abdomen reveals - No Hernias. Palpation/Percussion Palpation and Percussion of the abdomen reveal - Soft, Non Tender, No Rigidity (guarding), No hepatosplenomegaly and No Palpable abdominal  masses.  Neurologic Neurologic evaluation reveals -alert and oriented x 3 with no impairment of recent or remote memory, normal attention span and ability to concentrate, normal sensation and normal coordination.  Musculoskeletal Normal Exam - Bilateral-Upper Extremity Strength Normal and Lower Extremity Strength Normal.    Assessment & Plan Sandra Ruff MD; 0000000 5:06 PM)  DIVERTICULITIS, COLON (K57.32) Impression: 44 year old female who presents today to discuss surgery for her diverticulitis. She is status post drain placement. She also underwent a colonoscopy which did not show any signs of malignancy within the sigmoid colon. Currently she is having symptoms consistent with a sigmoid stricture. She may also have some smoldering diverticulitis. I have recommended that we go ahead and schedule her for a robotic-assisted sigmoidectomy. We discussed the need for cystoscopy and firefighter injection to help identify her left ureter. We discussed risk of bleeding can be higher in patients with more active diverticulitis. We also discussed that there appears to be some small bowel inflammation surrounding her sigmoid colon and we may need to perform a small bowel resection. All questions were answered. She is ready to proceed with surgery. The surgery and anatomy were described to the patient as well as the risks of surgery and the possible complications. These include: Bleeding, deep abdominal infections and possible wound complications such as hernia and infection, damage to adjacent structures, leak of surgical connections, which can lead to other surgeries and possibly an ostomy, possible need for other procedures, such as abscess drains in radiology, possible prolonged hospital stay, possible diarrhea from removal of part of the colon, possible constipation from narcotics, possible bowel, bladder or sexual dysfunction if having rectal surgery, prolonged fatigue/weakness or appetite loss,  possible early recurrence of of disease, possible complications of their medical problems such as heart disease or arrhythmias or lung problems, death (less than 1%). I believe the patient understands and wishes to proceed with the surgery.

## 2019-06-17 ENCOUNTER — Other Ambulatory Visit: Payer: Self-pay | Admitting: Urology

## 2019-06-24 ENCOUNTER — Other Ambulatory Visit (INDEPENDENT_AMBULATORY_CARE_PROVIDER_SITE_OTHER): Payer: BC Managed Care – PPO

## 2019-06-24 ENCOUNTER — Encounter: Payer: Self-pay | Admitting: Gastroenterology

## 2019-06-24 ENCOUNTER — Ambulatory Visit: Payer: BC Managed Care – PPO | Admitting: Gastroenterology

## 2019-06-24 VITALS — BP 122/78 | HR 90 | Temp 98.0°F | Ht 66.0 in | Wt 204.0 lb

## 2019-06-24 DIAGNOSIS — R935 Abnormal findings on diagnostic imaging of other abdominal regions, including retroperitoneum: Secondary | ICD-10-CM

## 2019-06-24 DIAGNOSIS — D508 Other iron deficiency anemias: Secondary | ICD-10-CM | POA: Diagnosis not present

## 2019-06-24 DIAGNOSIS — D509 Iron deficiency anemia, unspecified: Secondary | ICD-10-CM

## 2019-06-24 DIAGNOSIS — D128 Benign neoplasm of rectum: Secondary | ICD-10-CM

## 2019-06-24 DIAGNOSIS — K5732 Diverticulitis of large intestine without perforation or abscess without bleeding: Secondary | ICD-10-CM | POA: Diagnosis not present

## 2019-06-24 DIAGNOSIS — Z8719 Personal history of other diseases of the digestive system: Secondary | ICD-10-CM

## 2019-06-24 DIAGNOSIS — R194 Change in bowel habit: Secondary | ICD-10-CM | POA: Diagnosis not present

## 2019-06-24 LAB — CBC
HCT: 39.4 % (ref 36.0–46.0)
Hemoglobin: 12.8 g/dL (ref 12.0–15.0)
MCHC: 32.5 g/dL (ref 30.0–36.0)
MCV: 81.8 fl (ref 78.0–100.0)
Platelets: 339 10*3/uL (ref 150.0–400.0)
RBC: 4.82 Mil/uL (ref 3.87–5.11)
RDW: 17.5 % — ABNORMAL HIGH (ref 11.5–15.5)
WBC: 7.4 10*3/uL (ref 4.0–10.5)

## 2019-06-24 LAB — IBC + FERRITIN
Ferritin: 500.8 ng/mL — ABNORMAL HIGH (ref 10.0–291.0)
Iron: 104 ug/dL (ref 42–145)
Saturation Ratios: 41.3 % (ref 20.0–50.0)
Transferrin: 180 mg/dL — ABNORMAL LOW (ref 212.0–360.0)

## 2019-06-24 NOTE — Patient Instructions (Signed)
If you are age 46 or older, your body mass index should be between 23-30. Your Body mass index is 32.93 kg/m. If this is out of the aforementioned range listed, please consider follow up with your Primary Care Provider.  If you are age 44 or younger, your body mass index should be between 19-25. Your Body mass index is 32.93 kg/m. If this is out of the aformentioned range listed, please consider follow up with your Primary Care Provider.    Your provider has requested that you go to the basement level for lab work before leaving today. Press "B" on the elevator. The lab is located at the first door on the left as you exit the elevator.    You will be due for a recall colonoscopy in August 2021. We will send you a reminder in the mail when it gets closer to that time.   Due to recent changes in healthcare laws, you may see the results of your imaging and laboratory studies on MyChart before your provider has had a chance to review them.  We understand that in some cases there may be results that are confusing or concerning to you. Not all laboratory results come back in the same time frame and the provider may be waiting for multiple results in order to interpret others.  Please give [place] 48 hours in order for your provider to thoroughly review all the results before contacting the office for clarification of your results.   Thank you for choosing me and [facility].  Dr. Rush Landmark

## 2019-06-24 NOTE — Progress Notes (Signed)
[place] VISIT   Primary Care Provider [name], MD [place] [address] [phone]   Patient Profile: Sandra Bonilla is a 44 y.o. female with a pmh significant for complicated diverticulitis with abscess status post percutaneous drains, anxiety, depression, cholelithiasis status post cholecystectomy, hypertension, obesity, multiple sclerosis (on no medications), rectal TVA status post resection.  The patient presents to the [facility] for an evaluation and management of problem(s) noted below:  Problem List 1. Diverticulitis of large intestine with complication   2. Tubulovillous adenoma of rectum   3. Other iron deficiency anemia   4. Change in bowel habits   5. Abnormal CT scan, pelvis     History of Present Illness Please see prior consultation note for full details of HPI.    Interval History Today, the patient returns for scheduled follow-up.  The patient recently had progressive abdominal discomfort leading to a CT abdomen/pelvis that was re-reviewed today.  I cannot see the images themselves but the report was standing into the chart prior documentation was seen.  Her PCP placed her on antibiotics after the CT scan performed.  Dr. [name] has evaluated the patient once again is planning surgical intervention for her recurrent inflammatory process in the lung: Diverticulosis.  This is planned this time may.  Patient currently is feeling well today.  No significant abdominal pain.  She is no longer on antibiotics.  She still recounts that she has intermittent bowel habits and changes in stool caliber.  At times she will be more constipated and other times she will have multiple loose bowel movements.  No blood in the stool.  Was able to complete her to IV iron infusions on the first when she had some systemic symptoms the second 1 went well.  She has not had a repeat laboratories to see how her iron indices  and blood counts may have changed with IV iron.  GI Review of Systems Positive as above Negative for odynophagia, dysphagia early satiety, melena, hematochezia, significant weight loss unintentionally  Review of Systems General: Denies current fevers or chills Cardiovascular: Denies chest pain Pulmonary: No significant shortness of breath Gastroenterological: See HPI Genitourinary: Denies darkened urine or hematuria Hematological: Denies easy bruising/bleeding Dermatological: Denies jaundice Psychological: Mood is stable   Medications Current Outpatient Medications  Medication Sig Dispense Refill  . acetaminophen (TYLENOL) 325 MG tablet Take 2 tablets (650 mg total) by mouth every 6 (six) hours as needed for mild pain (or Fever >/= 101).    . AUBAGIO 14 MG TABS TAKE 1 TABLET BY MOUTH  DAILY 30 tablet 11  . chlorthalidone (HYGROTON) 25 MG tablet Take 12.5 mg by mouth daily.    . cholecalciferol (VITAMIN D3) 25 MCG (1000 UT) tablet Take 1,000 Units by mouth daily.    . cyclobenzaprine (FLEXERIL) 5 MG tablet Take 1 tablet (5 mg total) by mouth at bedtime as needed. (Patient taking differently: Take 5 mg by mouth at bedtime as needed for muscle spasms. ) 30 tablet 11  . ferrous gluconate (FERGON) 324 MG tablet TAKE 1 TABLET(324 MG) BY MOUTH DAILY WITH BREAKFAST 30 tablet 2  . folic acid (FOLVITE) 1 MG tablet TAKE 1 TABLET(1 MG) BY MOUTH DAILY 30 tablet 1  . gabapentin (NEURONTIN) 100 MG capsule Take 100 mg by mouth daily as needed (nerve pain).     Marland Kitchen levonorgestrel (MIRENA) 20 MCG/24HR IUD 1 each by Intrauterine route once.    Marland Kitchen LORazepam (ATIVAN) 1 MG  tablet Take 1 mg by mouth 2 (two) times daily as needed for anxiety.     . meloxicam (MOBIC) 15 MG tablet Take 1 tablet (15 mg total) by mouth daily. (Patient taking differently: Take 15 mg by mouth daily as needed. ) 30 tablet 5  . omeprazole (PRILOSEC) 40 MG capsule TAKE 1 CAPSULE(40 MG) BY MOUTH DAILY BEFORE BREAKFAST 30 capsule 2  .  oxybutynin (DITROPAN) 5 MG tablet TAKE 1 TABLET(5 MG) BY MOUTH TWICE DAILY 180 tablet 3  . Polyvinyl Alcohol-Povidone (REFRESH OP) Place 2 drops into both eyes 4 (four) times daily as needed (dryness).    . Probiotic Product (PROBIOTIC DAILY PO) Take by mouth.    . Probiotic Product (PROBIOTIC PO) Take 1 capsule by mouth daily.    . TRELEGY ELLIPTA 100-62.5-25 MCG/INH AEPB Inhale 1 puff into the lungs daily.    Marland Kitchen umeclidinium-vilanterol (ANORO ELLIPTA) 62.5-25 MCG/INH AEPB Inhale into the lungs.     No current facility-administered medications for this visit.    Allergies Allergies  Allergen Reactions  . Sulfamethoxazole-Trimethoprim Rash and Hives  . Sulfa Antibiotics Other (See Comments)    Histories Past Medical History:  Diagnosis Date  . Anxiety   . Asthma   . Cervical high risk human papillomavirus (HPV) DNA test positive 09/2015  . Cholecystitis 2006   GALBLADDER REMOVED  . Depression   . Diverticulitis   . Diverticulosis   . 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 History of mammogram 11/2014   FIBROADENOMA  . History of Papanicolaou smear of cervix 01/24/13; 10/15/15   -/-; -/+  . Hypertension   . Menorrhagia 02/01/2010   D/C HYSTEROSCOPY ENDO POLYP  . MS (multiple sclerosis) ([place])   . Ovarian cyst 9/15; 2015-2016   LEFT - WENT TO [place]; RIGHT  . 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 BIOPSY  03/21/2019   Procedure: BIOPSY;  Surgeon: Rush Landmark Telford Nab., MD;  Location: [facility];  Service: Gastroenterology;;  . BREAST BIOPSY Right 2016   Fibroadenoma  . CHOLECYSTECTOMY  2006  . COLONOSCOPY WITH PROPOFOL N/A 03/21/2019   Procedure: COLONOSCOPY WITH PROPOFOL;  Surgeon: Rush Landmark Telford Nab., MD;  Location: [facility];  Service: Gastroenterology;  Laterality: N/A;  ultraslim colonoscope   . DILATION AND CURETTAGE, DIAGNOSTIC / THERAPEUTIC  02/01/2010   D/C HYSTEROSCOPY ENDO POLYP; [facility]  .  ENDOSCOPIC MUCOSAL RESECTION  03/21/2019   Procedure: ENDOSCOPIC MUCOSAL RESECTION;  Surgeon: Rush Landmark Telford Nab., MD;  Location: [facility];  Service: Gastroenterology;;  . ESOPHAGOGASTRODUODENOSCOPY (EGD) WITH PROPOFOL N/A 03/21/2019   Procedure: ESOPHAGOGASTRODUODENOSCOPY (EGD) WITH PROPOFOL;  Surgeon: [name], MD;  Location: [facility];  Service: Gastroenterology;  Laterality: N/A;  . EYE SURGERY     Lasik  . FLEXIBLE SIGMOIDOSCOPY  07/31/2016   Procedure: FLEXIBLE SIGMOIDOSCOPY;  Surgeon: [name], MD;  Location: [facility];  Service: Endoscopy;;  . HEMOSTASIS CLIP PLACEMENT  03/21/2019   Procedure: HEMOSTASIS CLIP PLACEMENT;  Surgeon: [name], MD;  Location: [facility];  Service: Gastroenterology;;  . HYSTEROSCOPY  02/01/2010   ENDO POLYP - [facility]  . IR RADIOLOGIST EVAL & MGMT  11/16/2018  . SCLEROTHERAPY  03/21/2019   Procedure: Clide Deutscher;  Surgeon: Mansouraty, Telford Nab., MD;  Location: [facility] END[facility]COPY;  Service: Gastroenterology;;   Social History   Socioeconomic History  . Marital status: Divorced    Spouse name: Not on file  . Number of children: 0  . Years  of education: 14  . Highest education level: Not on file  Occupational History    Employer: [employer]  Tobacco Use  . Smoking status: Former Smoker    Years: 10.00    Quit date: 2010    Years since quitting: 11.2  . Smokeless tobacco: Never Used  Substance and Sexual Activity  . Alcohol use: No  . Drug use: No  . Sexual activity: Not Currently    Partners: Male    Birth control/protection: I.U.D.    Comment: Mirena  Other Topics Concern  . Not on file  Social History Narrative  . Not on file   Social Determinants of Health   Financial Resource Strain:   . Difficulty of Paying Living Expenses:   Food Insecurity:   . Worried About Charity fundraiser in the Last Year:   . Arboriculturist in the Last Year:   Transportation Needs:   . Film/video editor  (Medical):   Marland Kitchen Lack of Transportation (Non-Medical):   Physical Activity:   . Days of Exercise per Week:   . Minutes of Exercise per Session:   Stress:   . Feeling of Stress :   Social Connections:   . Frequency of Communication with Friends and Family:   . Frequency of Social Gatherings with Friends and Family:   . Attends Religious Services:   . Active Member of Clubs or Organizations:   . Attends Archivist Meetings:   Marland Kitchen Marital Status:   Intimate Partner Violence:   . Fear of Current or Ex-Partner:   . Emotionally Abused:   Marland Kitchen Physically Abused:   . Sexually Abused:    Family History  Problem Relation Age of Onset  . Breast cancer Maternal Aunt 50       has contact  . Rheum arthritis Mother   . Hypertension Mother   . Thyroid disease Mother        HYPOTHYROIDISM  . Lung cancer Mother 56  . Heart disease Father   . Diabetes Father   . Hypertension Father   . Cancer Paternal Grandmother        MELANOMA OF SKIN  . Cancer Cousin        KIDNEY-COUSIN  . Colon cancer Neg Hx   . Stomach cancer Neg Hx   . Pancreatic cancer Neg Hx   . Esophageal cancer Neg Hx   . Inflammatory bowel disease Neg Hx   . Rectal cancer Neg Hx    I have reviewed her medical, social, and family history in detail and updated the electronic medical record as necessary.    PHYSICAL EXAMINATION  BP 122/78   Pulse 90   Temp 98 F (36.7 C)   Ht 5\' 6"  (1.676 m)   Wt 204 lb (92.5 kg)   BMI 32.93 kg/m  Wt Readings from Last 3 Encounters:  06/24/19 204 lb (92.5 kg)  03/21/19 210 lb (95.3 kg)  03/08/19 211 lb (95.7 kg)  GEN: NAD, appears stated age, doesn't appear chronically ill PSYCH: Cooperative, without pressured speech EYE: Conjunctivae pink, sclerae anicteric ENT: MMM CV: Nontachycardic RESP: No wheezing present GI: NABS, soft, nontender, rounded, no rebound or guarding, scar signs present on abdomen MSK/EXT: No significant lower extremity edema SKIN: No jaundice NEURO:   Alert & Oriented x 3, no focal deficits   REVIEW OF DATA  I reviewed the following data at the time of this encounter:  GI Procedures and Studies  December 2020 EGD - No  gross lesions in esophagus. Z-line regular, 37 cm from the incisors. - Erosive nodular gastropathy with no bleeding and no stigmata of recent bleeding. Biopsied for HP. - No gross lesions in the duodenal bulb, in the first portion of the duodenum and in the second portion of the duodenum. Biopsied for Celiac. December 2020 colonoscopy - Palpable rectal lesion found on digital rectal exam. - The examined portion of the ileum was normal. - Diverticulosis in the recto-sigmoid colon, in the sigmoid colon and in the descending colon. - One 18 mm polyp in the distal rectum, removed with mucosal resection. Resected and retrieved. Treated with a hot snare tip to the edge of resection. Clips (MR conditional) were placed. - Normal mucosa in the entire examined colon otherwise. - Non-bleeding non-thrombosed internal hemorrhoids.  Pathology FINAL MICROSCOPIC DIAGNOSIS:  A. DUODENUM, BIOPSY:  - Duodenal mucosa with no specific histopathologic changes  - Negative for increased intraepithelial lymphocytes or villous  architectural changes  B. STOMACH, BIOPSY:  - Gastric antral and oxyntic mucosa with no specific histopathologic  changes  - Warthin Starry stain is negative for Helicobacter pylori  C. RECTUM, EMR:  - Tubulovillous adenoma with focal high-grade dysplasia  - Negative for carcinoma  - Lateral mucosal margin is focally positive for low-grade dysplasia  Laboratory Studies  Reviewed those in epic  Imaging Studies  February 2021 outside CT abdomen/pelvis Patient underwent a CT scan of the abdomen/pelvis for history of left lower quadrant pain and previous diverticulitis with abscess This CT scan showed a postcholecystectomy clips in the right upper quadrant Liver pancreas and spleen were unremarkable No  retroperitoneal mass or adenopathy or aneurysm was noted. A left adrenal adenoma 1.7 x 1.4 cm was noted right adrenal gland was unremarkable. No renal masses or renal obstruction. No evidence of appendicitis. No evidence of bowel obstruction. In the pelvis there is an area of density adjacent to the small bowel loops in the upper pelvic region having the appearance of adhesions.  In addition adjacent to this area tiny moieties of air within a low-density collection with peripheral calcification.  It is uncertain whether this represents a chronic walled off abscess or recurrent abscess.  This does appear to be connected to the sigmoid colon by a band of tissue.  This finding is best seen on series 2 image 72.  The collection suggesting chronic walled off abscess or recurrent abscess measures 2.3 x 2.9 x 1.7 cm. There is thickening of the adjacent loop of sigmoid colon with diverticulosis extending over a distance of approximately 8 cm.  No other abnormal fluid collections are noted in the pelvis.  No inguinal mass or adenopathy. Lung bases appear clear. No lytic or blastic lesions are identified.   ASSESSMENT  Ms. Dame is a 44 y.o. female with a pmh significant for complicated diverticulitis with abscess status post percutaneous drains, anxiety, depression, cholelithiasis status post cholecystectomy, hypertension, obesity, multiple sclerosis (on no medications), rectal TVA status post resection.  The patient is seen today for evaluation and management of:  1. Diverticulitis of large intestine with complication   2. Tubulovillous adenoma of rectum   3. Other iron deficiency anemia   4. Change in bowel habits   5. Abnormal CT scan, pelvis    Today, the patient seems to be hemodynamically and clinically stable.  She does continue to have evidence on recent CT scan of abnormal findings concerning for a chronic ongoing inflammation of the left colon.  She is scheduled for outpatient resection in  May  of this year.  She is doing well without antibiotics at this time.  Regimented bowel habits are likely a result of this continued inflammatory process.  Be interesting to see how her bowel habits change after the postsurgical time.  We discussed that we will continue to work on her bowel habits depending on how things go after her surgery.  She may benefit from bile acid sequestrant therapy due to prior cholecystectomy.  The patient has evidence of iron deficiency anemia we will repeat labs to see how her labs look post 2 IV iron infusions.  The patient continues to have anemia we will consider video capsule endoscopy.  However, involuntary hospitalization iron deficiency anemia could be a result of the vomiting process so hopefully like to wait on doing a capsule if needed until after her surgery to see how things are we will decide based on the labs if that is possible.  I will hold on any antibiotics at this time and the patient will let her PCP or [place] or Dr. [name] know as to how she is doing okay she will need another round of antibiotics to her surgery.  Patient has a history of a rectal TVA status post resection with a lateral and suggested of dysplasia although I suspect this was taken care of with cautery effect with STSC.  She would be due for 42-month follow-up in May of this year however she will be undergoing surgery.  Push her follow-up to July or August and she is aware of this.  The recall will be updated in the system.  She will continue PPI therapy for dyspepsia symptoms.  All patient questions were answered, to the best of my ability, and the patient agrees to the aforementioned plan of action with follow-up as indicated.   PLAN  Laboratories as outlined below If evidence of iron deficiency anemia persists will consider role of video capsule endoscopy recurrent IV iron infusions Continue PPI daily Follow-up with Dr. [name] for upcoming surgical intervention for persistent left-sided  diverticular complications currently stable Rectal TVA follow-up in July/August of this year   Orders Placed This Encounter  Procedures  . CBC  . IBC + Ferritin    New Prescriptions   No medications on file   Modified Medications   No medications on file    Planned Follow Up No follow-ups on file.   Total Time in Face-to-Face and in Coordination of Care for patient including independent/personal interpretation/review of prior testing, medical history, examination, medication adjustment, communicating results with the patient directly, and documentation with the EHR is 25 minutes.   Justice Britain, MD [facility] # [phone]

## 2019-06-25 ENCOUNTER — Encounter: Payer: Self-pay | Admitting: Gastroenterology

## 2019-06-25 DIAGNOSIS — D638 Anemia in other chronic diseases classified elsewhere: Secondary | ICD-10-CM | POA: Insufficient documentation

## 2019-06-25 DIAGNOSIS — R194 Change in bowel habit: Secondary | ICD-10-CM | POA: Insufficient documentation

## 2019-06-25 DIAGNOSIS — D128 Benign neoplasm of rectum: Secondary | ICD-10-CM | POA: Insufficient documentation

## 2019-06-25 DIAGNOSIS — D649 Anemia, unspecified: Secondary | ICD-10-CM | POA: Insufficient documentation

## 2019-06-27 ENCOUNTER — Other Ambulatory Visit: Payer: Self-pay

## 2019-06-27 DIAGNOSIS — D508 Other iron deficiency anemias: Secondary | ICD-10-CM

## 2019-06-28 ENCOUNTER — Telehealth: Payer: Self-pay | Admitting: *Deleted

## 2019-06-28 NOTE — Telephone Encounter (Signed)
Called pt because we received fax from [facility] that they have been unable to reach her about refill for Aubagio. She has no missed calls from them. She will call them back, nothing further needed.

## 2019-07-03 ENCOUNTER — Other Ambulatory Visit: Payer: Self-pay | Admitting: Gastroenterology

## 2019-07-05 ENCOUNTER — Telehealth: Payer: Self-pay | Admitting: Gastroenterology

## 2019-07-05 MED ORDER — AMOXICILLIN-POT CLAVULANATE 875-125 MG PO TABS: 1.0000 | ORAL_TABLET | Freq: Two times a day (BID) | ORAL | 0 refills | Status: AC

## 2019-07-05 NOTE — Telephone Encounter (Signed)
Patient called with concerns said she has a lot of pressure in her stomach seeking advise

## 2019-07-05 NOTE — Telephone Encounter (Signed)
The pt has been called and she is aware that abx will be started.  She prefers augmentin.  I have sent that to the local pharmacy she chose/  She will call if she does not improve.

## 2019-07-05 NOTE — Telephone Encounter (Signed)
The pt is complaining of very low abd pressure that began 3-4 days ago.  Nothing makes it better or worse. Bowels are normal.  Had a small amount of BRB on 1 occasion last week.  She states that she also has the pressure when urinating.  Has some nausea that comes and goes.  She would like Dr Mansouraty's advise (should she be worried) She is aware Dr Rush Landmark is out of the office today

## 2019-07-05 NOTE — Telephone Encounter (Signed)
Patty, I would recommend a round of antibiotics for presumed recurrent diverticulitis. Ciprofloxacon 500 BID/ Flagyl 500 BID x 10 days or  Augmentin 875 BID x 10 days. If worsening symptoms then we will proceed with a CTAP with IV and oral contrast.  I am forwarding this to her colorectal surgeon who is planning upcoming partial colectomy.  FYI Dr. [name].  GM

## 2019-07-21 NOTE — Patient Instructions (Signed)
DUE TO COVID-19 ONLY ONE VISITOR IS ALLOWED TO COME WITH YOU AND STAY IN THE WAITING ROOM ONLY DURING PRE OP AND PROCEDURE DAY OF SURGERY. THE 2 VISITORS MAY VISIT WITH YOU AFTER SURGERY IN YOUR PRIVATE ROOM DURING VISITING HOURS ONLY!  YOU NEED TO HAVE A COVID 19 TEST ON__5/3/21_____ @_2 :50p______, THIS TEST MUST BE DONE BEFORE SURGERY, COME  [facility] [place] , [address].  ([place]) ONCE YOUR COVID TEST IS COMPLETED, PLEASE BEGIN THE QUARANTINE INSTRUCTIONS AS OUTLINED IN YOUR HANDOUT.                Sandra Bonilla   Your procedure is scheduled on: 08/04/19   Report to [facility] Main  Entrance   Report to Short Stay at 5:30 AM     Call this number if you have problems the morning of surgery [phone]   Drink plenty of fluids on the day of your prep to prevent dehydration.  At 10:00 PM drink 2 pre op clear ensure drinks.  No food after midnight.  At 4:30 AM drink the last clear ensure then nothing more by mouth.  BRUSH YOUR TEETH MORNING OF SURGERY AND RINSE YOUR MOUTH OUT, NO CHEWING GUM CANDY OR MINTS.     Take these medicines the morning of surgery with A SIP OF WATER: Gabapentin, Aubagio, Prilosec, Ditropan, use your inhalers and bring them with you to the hospital                                You may not have any metal on your body including hair pins and              piercings  Do not wear jewelry, make-up, lotions, powders or perfumes, deodorant             Do not wear nail polish on your fingernails.  Do not shave  48 hours prior to surgery.              Do not bring valuables to the hospital. [place].  Contacts, dentures or bridgework may not be worn into surgery.       Name and phone number of your driver:  Special Instructions: N/A              Please read over the following fact sheets you were  given: _____________________________________________________________________             [facility] - Preparing for Surgery Before surgery, you can play an important role.   Because skin is not sterile, your skin needs to be as free of germs as possible.   You can reduce the number of germs on your skin by washing with CHG (chlorahexidine gluconate) soap before surgery .  CHG is an antiseptic cleaner which kills germs and bonds with the skin to continue killing germs even after washing. Please DO NOT use if you have an allergy to CHG or antibacterial soaps.   If your skin becomes reddened/irritated stop using the CHG and inform your nurse when you arrive at Short Stay. Do not shave (including legs and underarms) for at least 48 hours prior to the first CHG shower.   Please follow these instructions carefully:  1.  Shower with CHG Soap the night before surgery and the  morning of Surgery.  2.  If you choose to wash your hair, wash your hair first as usual with your  normal  shampoo.  3.  After you shampoo, rinse your hair and body thoroughly to remove the  shampoo.                                        4.  Use CHG as you would any other liquid soap.  You can apply chg directly  to the skin and wash                       Gently with a scrungie or clean washcloth.  5.  Apply the CHG Soap to your body ONLY FROM THE NECK DOWN.   Do not use on face/ open                           Wound or open sores. Avoid contact with eyes, ears mouth and genitals (private parts).                       Wash face,  Genitals (private parts) with your normal soap.             6.  Wash thoroughly, paying special attention to the area where your surgery  will be performed.  7.  Thoroughly rinse your body with warm water from the neck down.  8.  DO NOT shower/wash with your normal soap after using and rinsing off  the CHG Soap.             9.  Pat yourself dry with a clean towel.            10.  Wear clean  pajamas.            11.  Place clean sheets on your bed the night of your first shower and do not  sleep with pets. Day of Surgery : Do not apply any lotions/deodorants the morning of surgery.  Please wear clean clothes to the hospital/surgery center.  FAILURE TO FOLLOW THESE INSTRUCTIONS MAY RESULT IN THE CANCELLATION OF YOUR SURGERY PATIENT SIGNATURE_________________________________  NURSE SIGNATURE__________________________________  ________________________________________________________________________

## 2019-07-22 ENCOUNTER — Other Ambulatory Visit: Payer: Self-pay

## 2019-07-22 ENCOUNTER — Encounter (HOSPITAL_COMMUNITY): Payer: Self-pay

## 2019-07-22 ENCOUNTER — Encounter (HOSPITAL_COMMUNITY)
Admission: RE | Admit: 2019-07-22 | Discharge: 2019-07-22 | Disposition: A | Discharge status: Home / Self Care | Payer: BC Managed Care – PPO | Source: Ambulatory Visit | Attending: General Surgery | Admitting: General Surgery

## 2019-07-22 DIAGNOSIS — Z01812 Encounter for preprocedural laboratory examination: Secondary | ICD-10-CM | POA: Insufficient documentation

## 2019-07-22 LAB — BASIC METABOLIC PANEL
Anion gap: 9 (ref 5–15)
BUN: 13 mg/dL (ref 6–20)
CO2: 31 mmol/L (ref 22–32)
Calcium: 8.8 mg/dL — ABNORMAL LOW (ref 8.9–10.3)
Chloride: 101 mmol/L (ref 98–111)
Creatinine, Ser: 0.77 mg/dL (ref 0.44–1.00)
GFR calc Af Amer: >60 mL/min (ref >60)
GFR calc non Af Amer: >60 mL/min (ref >60)
Glucose, Bld: 89 mg/dL (ref 70–99)
Potassium: 3.9 mmol/L (ref 3.5–5.1)
Sodium: 141 mmol/L (ref 135–145)

## 2019-07-22 LAB — CBC
HCT: 41.8 % (ref 36.0–46.0)
Hemoglobin: 12.8 g/dL (ref 12.0–15.0)
MCH: 26.6 pg (ref 26.0–34.0)
MCHC: 30.6 g/dL (ref 30.0–36.0)
MCV: 86.9 fL (ref 80.0–100.0)
Platelets: 385 10*3/uL (ref 150–400)
RBC: 4.81 MIL/uL (ref 3.87–5.11)
RDW: 16.4 % — ABNORMAL HIGH (ref 11.5–15.5)
WBC: 8.9 10*3/uL (ref 4.0–10.5)
nRBC: 0 % (ref 0.0–0.2)

## 2019-07-22 NOTE — Progress Notes (Signed)
PCP -Dr.A Elijio Miles  Cardiologist - none  Chest x-ray - no EKG -11/22/18  Stress Test -  ECHO - no Cardiac Cath - no  Sleep Study - no CPAP -   Fasting Blood Sugar - NA Checks Blood Sugar _____ times a day  Blood Thinner Instructions:NA Aspirin Instructions: Last Dose:  Anesthesia review:   Patient denies shortness of breath, fever, cough and chest pain at PAT appointment yes  Patient verbalized understanding of instructions that were given to them at the PAT appointment. Patient was also instructed that they will need to review over the PAT instructions again at home before surgery. yes

## 2019-08-01 ENCOUNTER — Other Ambulatory Visit (HOSPITAL_COMMUNITY)
Admission: RE | Admit: 2019-08-01 | Discharge: 2019-08-01 | Disposition: A | Discharge status: Home / Self Care | Payer: BC Managed Care – PPO | Source: Ambulatory Visit | Attending: General Surgery | Admitting: General Surgery

## 2019-08-01 DIAGNOSIS — Z01812 Encounter for preprocedural laboratory examination: Secondary | ICD-10-CM | POA: Insufficient documentation

## 2019-08-01 DIAGNOSIS — Z20822 Contact with and (suspected) exposure to covid-19: Secondary | ICD-10-CM | POA: Insufficient documentation

## 2019-08-01 LAB — SARS CORONAVIRUS 2 (TAT 6-24 HRS): SARS Coronavirus 2: NEGATIVE

## 2019-08-03 ENCOUNTER — Other Ambulatory Visit: Payer: Self-pay | Admitting: Gastroenterology

## 2019-08-03 MED ORDER — BUPIVACAINE LIPOSOME 1.3 % IJ SUSP
20.0000 mL | Freq: Once | INTRAMUSCULAR | Status: DC
  Filled 2019-08-03: qty 20

## 2019-08-03 NOTE — Anesthesia Preprocedure Evaluation (Addendum)
Anesthesia Evaluation  Patient identified by MRN, date of birth, ID band Patient awake    Reviewed: Allergy & Precautions, NPO status , Patient's Chart, lab work & pertinent test results  Airway Mallampati: I       Dental no notable dental hx. (+) Teeth Intact   Pulmonary former smoker,    Pulmonary exam normal breath sounds clear to auscultation       Cardiovascular hypertension, Pt. on medications Normal cardiovascular exam Rhythm:Regular Rate:Normal     Neuro/Psych  Headaches, PSYCHIATRIC DISORDERS Anxiety Depression    GI/Hepatic GERD  Medicated,  Endo/Other    Renal/GU      Musculoskeletal   Abdominal (+) + obese,   Peds  Hematology   Anesthesia Other Findings   Reproductive/Obstetrics                            Anesthesia Physical Anesthesia Plan  ASA: II  Anesthesia Plan: General   Post-op Pain Management:    Induction:   PONV Risk Score and Plan: 4 or greater and Ondansetron, Dexamethasone and Midazolam  Airway Management Planned: Oral ETT  Additional Equipment: None  Intra-op Plan:   Post-operative Plan: Extubation in OR  Informed Consent: I have reviewed the patients History and Physical, chart, labs and discussed the procedure including the risks, benefits and alternatives for the proposed anesthesia with the patient or authorized representative who has indicated his/her understanding and acceptance.     Dental advisory given  Plan Discussed with: CRNA  Anesthesia Plan Comments:        Anesthesia Quick Evaluation

## 2019-08-04 ENCOUNTER — Inpatient Hospital Stay (HOSPITAL_COMMUNITY)
Admission: RE | Admit: 2019-08-04 | Discharge: 2019-08-29 | DRG: 330 | Disposition: A | Discharge status: Home Health | Payer: BC Managed Care – PPO | Attending: General Surgery | Admitting: General Surgery

## 2019-08-04 ENCOUNTER — Inpatient Hospital Stay (HOSPITAL_COMMUNITY): Payer: BC Managed Care – PPO | Admitting: Anesthesiology

## 2019-08-04 ENCOUNTER — Encounter (HOSPITAL_COMMUNITY): Payer: Self-pay | Admitting: General Surgery

## 2019-08-04 ENCOUNTER — Encounter (HOSPITAL_COMMUNITY)
Admission: RE | Disposition: A | Discharge status: Home / Self Care | Payer: Self-pay | Source: Home / Self Care | Attending: General Surgery

## 2019-08-04 ENCOUNTER — Other Ambulatory Visit: Payer: Self-pay

## 2019-08-04 DIAGNOSIS — K660 Peritoneal adhesions (postprocedural) (postinfection): Secondary | ICD-10-CM | POA: Diagnosis present

## 2019-08-04 DIAGNOSIS — F418 Other specified anxiety disorders: Secondary | ICD-10-CM | POA: Diagnosis present

## 2019-08-04 DIAGNOSIS — Z9049 Acquired absence of other specified parts of digestive tract: Secondary | ICD-10-CM

## 2019-08-04 DIAGNOSIS — I10 Essential (primary) hypertension: Secondary | ICD-10-CM | POA: Diagnosis present

## 2019-08-04 DIAGNOSIS — K5720 Diverticulitis of large intestine with perforation and abscess without bleeding: Principal | ICD-10-CM | POA: Diagnosis present

## 2019-08-04 DIAGNOSIS — Z975 Presence of (intrauterine) contraceptive device: Secondary | ICD-10-CM

## 2019-08-04 DIAGNOSIS — Z882 Allergy status to sulfonamides status: Secondary | ICD-10-CM | POA: Diagnosis not present

## 2019-08-04 DIAGNOSIS — E669 Obesity, unspecified: Secondary | ICD-10-CM | POA: Diagnosis present

## 2019-08-04 DIAGNOSIS — N321 Vesicointestinal fistula: Secondary | ICD-10-CM | POA: Diagnosis present

## 2019-08-04 DIAGNOSIS — D638 Anemia in other chronic diseases classified elsewhere: Secondary | ICD-10-CM | POA: Diagnosis present

## 2019-08-04 DIAGNOSIS — Z6834 Body mass index (BMI) 34.0-34.9, adult: Secondary | ICD-10-CM

## 2019-08-04 DIAGNOSIS — K5790 Diverticulosis of intestine, part unspecified, without perforation or abscess without bleeding: Secondary | ICD-10-CM | POA: Diagnosis present

## 2019-08-04 DIAGNOSIS — G4700 Insomnia, unspecified: Secondary | ICD-10-CM | POA: Diagnosis present

## 2019-08-04 DIAGNOSIS — K219 Gastro-esophageal reflux disease without esophagitis: Secondary | ICD-10-CM | POA: Diagnosis present

## 2019-08-04 DIAGNOSIS — G35 Multiple sclerosis: Secondary | ICD-10-CM | POA: Diagnosis present

## 2019-08-04 DIAGNOSIS — D62 Acute posthemorrhagic anemia: Secondary | ICD-10-CM | POA: Diagnosis not present

## 2019-08-04 DIAGNOSIS — Z87891 Personal history of nicotine dependence: Secondary | ICD-10-CM | POA: Diagnosis not present

## 2019-08-04 DIAGNOSIS — Z79899 Other long term (current) drug therapy: Secondary | ICD-10-CM

## 2019-08-04 DIAGNOSIS — R0602 Shortness of breath: Secondary | ICD-10-CM | POA: Diagnosis not present

## 2019-08-04 DIAGNOSIS — Y832 Surgical operation with anastomosis, bypass or graft as the cause of abnormal reaction of the patient, or of later complication, without mention of misadventure at the time of the procedure: Secondary | ICD-10-CM | POA: Diagnosis not present

## 2019-08-04 DIAGNOSIS — K9189 Other postprocedural complications and disorders of digestive system: Secondary | ICD-10-CM | POA: Diagnosis not present

## 2019-08-04 DIAGNOSIS — R609 Edema, unspecified: Secondary | ICD-10-CM | POA: Diagnosis not present

## 2019-08-04 DIAGNOSIS — Z932 Ileostomy status: Secondary | ICD-10-CM

## 2019-08-04 DIAGNOSIS — Z20822 Contact with and (suspected) exposure to covid-19: Secondary | ICD-10-CM | POA: Diagnosis present

## 2019-08-04 DIAGNOSIS — K651 Peritoneal abscess: Secondary | ICD-10-CM

## 2019-08-04 DIAGNOSIS — Z883 Allergy status to other anti-infective agents status: Secondary | ICD-10-CM

## 2019-08-04 HISTORY — PX: CYSTOSCOPY WITH INJECTION: SHX1424

## 2019-08-04 LAB — HCG, SERUM, QUALITATIVE: Preg, Serum: NEGATIVE

## 2019-08-04 SURGERY — COLECTOMY, PARTIAL, ROBOT-ASSISTED, LAPAROSCOPIC
Anesthesia: General | Site: Abdomen

## 2019-08-04 MED ORDER — CYCLOBENZAPRINE HCL 5 MG PO TABS: 5.0000 mg | ORAL_TABLET | Freq: Every evening | ORAL | Status: DC | PRN

## 2019-08-04 MED ORDER — LACTATED RINGERS IV SOLN: INTRAVENOUS | Status: DC

## 2019-08-04 MED ORDER — PANTOPRAZOLE SODIUM 40 MG PO TBEC
80.0000 mg | DELAYED_RELEASE_TABLET | Freq: Every day | ORAL | Status: DC
  Administered 2019-08-05 – 2019-08-07 (×3): 80 mg via ORAL
  Filled 2019-08-04 (×3): qty 2

## 2019-08-04 MED ORDER — PROPOFOL 10 MG/ML IV BOLUS
INTRAVENOUS | Status: AC
  Filled 2019-08-04: qty 20

## 2019-08-04 MED ORDER — TERIFLUNOMIDE 14 MG PO TABS
1.0000 | ORAL_TABLET | Freq: Every day | ORAL | Status: DC
  Administered 2019-08-05 – 2019-08-06 (×2): 1 via ORAL
  Filled 2019-08-04: qty 1

## 2019-08-04 MED ORDER — KETOROLAC TROMETHAMINE 30 MG/ML IJ SOLN
INTRAMUSCULAR | Status: AC
  Filled 2019-08-04: qty 1

## 2019-08-04 MED ORDER — BUPIVACAINE HCL 0.25 % IJ SOLN
INTRAMUSCULAR | Status: AC
  Filled 2019-08-04: qty 1

## 2019-08-04 MED ORDER — PROPOFOL 10 MG/ML IV BOLUS
INTRAVENOUS | Status: DC | PRN
  Administered 2019-08-04: 200 mg via INTRAVENOUS

## 2019-08-04 MED ORDER — METHYLENE BLUE 0.5 % INJ SOLN
INTRAVENOUS | Status: DC | PRN
  Administered 2019-08-04: 1 mL

## 2019-08-04 MED ORDER — FENTANYL CITRATE (PF) 250 MCG/5ML IJ SOLN
INTRAMUSCULAR | Status: DC | PRN
  Administered 2019-08-04 (×4): 50 ug via INTRAVENOUS
  Administered 2019-08-04: 150 ug via INTRAVENOUS

## 2019-08-04 MED ORDER — ACETAMINOPHEN 500 MG PO TABS
1000.0000 mg | ORAL_TABLET | Freq: Four times a day (QID) | ORAL | Status: DC
  Administered 2019-08-04 – 2019-08-07 (×12): 1000 mg via ORAL
  Filled 2019-08-04 (×13): qty 2

## 2019-08-04 MED ORDER — ONDANSETRON HCL 4 MG/2ML IJ SOLN
4.0000 mg | Freq: Four times a day (QID) | INTRAMUSCULAR | Status: DC | PRN
  Administered 2019-08-08: 4 mg via INTRAVENOUS
  Filled 2019-08-04: qty 2

## 2019-08-04 MED ORDER — GABAPENTIN 300 MG PO CAPS
300.0000 mg | ORAL_CAPSULE | Freq: Two times a day (BID) | ORAL | Status: DC
  Administered 2019-08-04 – 2019-08-07 (×6): 300 mg via ORAL
  Filled 2019-08-04 (×7): qty 1

## 2019-08-04 MED ORDER — 0.9 % SODIUM CHLORIDE (POUR BTL) OPTIME
TOPICAL | Status: DC | PRN
  Administered 2019-08-04: 2000 mL

## 2019-08-04 MED ORDER — POTASSIUM CHLORIDE CRYS ER 10 MEQ PO TBCR
10.0000 meq | EXTENDED_RELEASE_TABLET | Freq: Every day | ORAL | Status: DC
  Administered 2019-08-04 – 2019-08-07 (×4): 10 meq via ORAL
  Filled 2019-08-04 (×4): qty 1

## 2019-08-04 MED ORDER — TRAMADOL HCL 50 MG PO TABS
50.0000 mg | ORAL_TABLET | Freq: Four times a day (QID) | ORAL | Status: DC | PRN
  Administered 2019-08-04 – 2019-08-07 (×5): 50 mg via ORAL
  Filled 2019-08-04 (×5): qty 1

## 2019-08-04 MED ORDER — PHENYLEPHRINE 40 MCG/ML (10ML) SYRINGE FOR IV PUSH (FOR BLOOD PRESSURE SUPPORT)
PREFILLED_SYRINGE | INTRAVENOUS | Status: DC | PRN
  Administered 2019-08-04: 80 ug via INTRAVENOUS

## 2019-08-04 MED ORDER — MEPERIDINE HCL 50 MG/ML IJ SOLN: 6.2500 mg | INTRAMUSCULAR | Status: DC | PRN

## 2019-08-04 MED ORDER — ENSURE SURGERY PO LIQD
237.0000 mL | Freq: Two times a day (BID) | ORAL | Status: DC
  Administered 2019-08-04 – 2019-08-06 (×3): 237 mL via ORAL
  Filled 2019-08-04 (×7): qty 237

## 2019-08-04 MED ORDER — SUCCINYLCHOLINE CHLORIDE 200 MG/10ML IV SOSY
PREFILLED_SYRINGE | INTRAVENOUS | Status: DC | PRN
  Administered 2019-08-04: 160 mg via INTRAVENOUS

## 2019-08-04 MED ORDER — FENTANYL CITRATE (PF) 100 MCG/2ML IJ SOLN
INTRAMUSCULAR | Status: AC
  Filled 2019-08-04: qty 2

## 2019-08-04 MED ORDER — ALVIMOPAN 12 MG PO CAPS
12.0000 mg | ORAL_CAPSULE | Freq: Two times a day (BID) | ORAL | Status: DC
  Administered 2019-08-05 (×2): 12 mg via ORAL
  Filled 2019-08-04 (×3): qty 1

## 2019-08-04 MED ORDER — DEXAMETHASONE SODIUM PHOSPHATE 10 MG/ML IJ SOLN
INTRAMUSCULAR | Status: DC | PRN
  Administered 2019-08-04: 10 mg via INTRAVENOUS

## 2019-08-04 MED ORDER — SACCHAROMYCES BOULARDII 250 MG PO CAPS
250.0000 mg | ORAL_CAPSULE | Freq: Two times a day (BID) | ORAL | Status: DC
  Administered 2019-08-04 – 2019-08-07 (×6): 250 mg via ORAL
  Filled 2019-08-04 (×6): qty 1

## 2019-08-04 MED ORDER — ONDANSETRON HCL 4 MG/2ML IJ SOLN
INTRAMUSCULAR | Status: AC
  Filled 2019-08-04: qty 2

## 2019-08-04 MED ORDER — HYDROMORPHONE HCL 1 MG/ML IJ SOLN
INTRAMUSCULAR | Status: AC
  Filled 2019-08-04: qty 2

## 2019-08-04 MED ORDER — MIDAZOLAM HCL 2 MG/2ML IJ SOLN
INTRAMUSCULAR | Status: AC
  Filled 2019-08-04: qty 2

## 2019-08-04 MED ORDER — LIDOCAINE HCL 2 % IJ SOLN
INTRAMUSCULAR | Status: AC
  Filled 2019-08-04: qty 20

## 2019-08-04 MED ORDER — SODIUM CHLORIDE 0.9 % IR SOLN
Status: DC | PRN
  Administered 2019-08-04: 1000 mL

## 2019-08-04 MED ORDER — ENOXAPARIN SODIUM 40 MG/0.4ML ~~LOC~~ SOLN
40.0000 mg | SUBCUTANEOUS | Status: DC
  Administered 2019-08-05 – 2019-08-29 (×24): 40 mg via SUBCUTANEOUS
  Filled 2019-08-04 (×24): qty 0.4

## 2019-08-04 MED ORDER — DIPHENHYDRAMINE HCL 25 MG PO CAPS
25.0000 mg | ORAL_CAPSULE | Freq: Four times a day (QID) | ORAL | Status: DC | PRN
  Administered 2019-08-06: 25 mg via ORAL
  Filled 2019-08-04: qty 1

## 2019-08-04 MED ORDER — ROCURONIUM BROMIDE 10 MG/ML (PF) SYRINGE
PREFILLED_SYRINGE | INTRAVENOUS | Status: DC | PRN
  Administered 2019-08-04: 20 mg via INTRAVENOUS
  Administered 2019-08-04: 10 mg via INTRAVENOUS
  Administered 2019-08-04: 30 mg via INTRAVENOUS
  Administered 2019-08-04 (×2): 10 mg via INTRAVENOUS

## 2019-08-04 MED ORDER — SODIUM CHLORIDE 0.9 % IV SOLN
2.0000 g | Freq: Two times a day (BID) | INTRAVENOUS | Status: AC
  Administered 2019-08-04: 2 g via INTRAVENOUS
  Filled 2019-08-04: qty 2

## 2019-08-04 MED ORDER — BUPIVACAINE LIPOSOME 1.3 % IJ SUSP
INTRAMUSCULAR | Status: DC | PRN
  Administered 2019-08-04: 20 mL

## 2019-08-04 MED ORDER — GABAPENTIN 300 MG PO CAPS
300.0000 mg | ORAL_CAPSULE | ORAL | Status: AC
  Administered 2019-08-04: 300 mg via ORAL
  Filled 2019-08-04: qty 1

## 2019-08-04 MED ORDER — FENTANYL CITRATE (PF) 250 MCG/5ML IJ SOLN
INTRAMUSCULAR | Status: AC
  Filled 2019-08-04: qty 5

## 2019-08-04 MED ORDER — UMECLIDINIUM BROMIDE 62.5 MCG/INH IN AEPB
1.0000 | INHALATION_SPRAY | Freq: Every day | RESPIRATORY_TRACT | Status: DC
  Filled 2019-08-04: qty 7

## 2019-08-04 MED ORDER — KETOROLAC TROMETHAMINE 30 MG/ML IJ SOLN
30.0000 mg | Freq: Once | INTRAMUSCULAR | Status: AC | PRN
  Administered 2019-08-04: 30 mg via INTRAVENOUS

## 2019-08-04 MED ORDER — HYDROMORPHONE HCL 1 MG/ML IJ SOLN
0.5000 mg | INTRAMUSCULAR | Status: DC | PRN
  Administered 2019-08-04 – 2019-08-08 (×14): 0.5 mg via INTRAVENOUS
  Filled 2019-08-04 (×14): qty 0.5

## 2019-08-04 MED ORDER — BUPIVACAINE HCL (PF) 0.25 % IJ SOLN
INTRAMUSCULAR | Status: DC | PRN
  Administered 2019-08-04: 30 mL

## 2019-08-04 MED ORDER — ONDANSETRON HCL 4 MG/2ML IJ SOLN
INTRAMUSCULAR | Status: DC | PRN
  Administered 2019-08-04: 4 mg via INTRAVENOUS

## 2019-08-04 MED ORDER — INDOCYANINE GREEN 25 MG IV SOLR
INTRAVENOUS | Status: DC | PRN
Start: 2019-08-04 — End: 2019-08-04
  Administered 2019-08-04: 37.5 mg via INTRAVENOUS

## 2019-08-04 MED ORDER — ALUM & MAG HYDROXIDE-SIMETH 200-200-20 MG/5ML PO SUSP
30.0000 mL | Freq: Four times a day (QID) | ORAL | Status: DC | PRN
  Administered 2019-08-05: 30 mL via ORAL
  Filled 2019-08-04: qty 30

## 2019-08-04 MED ORDER — SODIUM CHLORIDE 0.9 % IV SOLN
2.0000 g | INTRAVENOUS | Status: AC
  Administered 2019-08-04: 2 g via INTRAVENOUS
  Filled 2019-08-04: qty 2

## 2019-08-04 MED ORDER — LIDOCAINE 20MG/ML (2%) 15 ML SYRINGE OPTIME
INTRAMUSCULAR | Status: DC | PRN
  Administered 2019-08-04: 1.5 mg/kg/h via INTRAVENOUS

## 2019-08-04 MED ORDER — ONDANSETRON HCL 4 MG PO TABS: 4.0000 mg | ORAL_TABLET | Freq: Four times a day (QID) | ORAL | Status: DC | PRN

## 2019-08-04 MED ORDER — ACETAMINOPHEN 500 MG PO TABS
1000.0000 mg | ORAL_TABLET | ORAL | Status: AC
  Administered 2019-08-04: 1000 mg via ORAL
  Filled 2019-08-04: qty 2

## 2019-08-04 MED ORDER — FLUTICASONE-UMECLIDIN-VILANT 100-62.5-25 MCG/INH IN AEPB: 1.0000 | INHALATION_SPRAY | Freq: Every day | RESPIRATORY_TRACT | Status: DC

## 2019-08-04 MED ORDER — LIDOCAINE 2% (20 MG/ML) 5 ML SYRINGE
INTRAMUSCULAR | Status: DC | PRN
  Administered 2019-08-04: 100 mg via INTRAVENOUS

## 2019-08-04 MED ORDER — POLYVINYL ALCOHOL-POVIDONE PF 1.4-0.6 % OP SOLN: 2.0000 [drp] | Freq: Four times a day (QID) | OPHTHALMIC | Status: DC | PRN

## 2019-08-04 MED ORDER — LIDOCAINE 2% (20 MG/ML) 5 ML SYRINGE
INTRAMUSCULAR | Status: AC
  Filled 2019-08-04: qty 5

## 2019-08-04 MED ORDER — KCL IN DEXTROSE-NACL 20-5-0.45 MEQ/L-%-% IV SOLN
INTRAVENOUS | Status: DC
  Filled 2019-08-04 (×2): qty 1000

## 2019-08-04 MED ORDER — KETAMINE HCL 10 MG/ML IJ SOLN
INTRAMUSCULAR | Status: DC | PRN
  Administered 2019-08-04: 50 mg via INTRAVENOUS

## 2019-08-04 MED ORDER — ALVIMOPAN 12 MG PO CAPS
12.0000 mg | ORAL_CAPSULE | ORAL | Status: AC
  Administered 2019-08-04: 12 mg via ORAL
  Filled 2019-08-04: qty 1

## 2019-08-04 MED ORDER — KETAMINE HCL 10 MG/ML IJ SOLN
INTRAMUSCULAR | Status: AC
  Filled 2019-08-04: qty 1

## 2019-08-04 MED ORDER — GABAPENTIN 100 MG PO CAPS
100.0000 mg | ORAL_CAPSULE | Freq: Every day | ORAL | Status: DC | PRN
  Administered 2019-08-05: 100 mg via ORAL
  Filled 2019-08-04: qty 1

## 2019-08-04 MED ORDER — HYDROMORPHONE HCL 1 MG/ML IJ SOLN
0.2500 mg | INTRAMUSCULAR | Status: DC | PRN
  Administered 2019-08-04 (×4): 0.5 mg via INTRAVENOUS

## 2019-08-04 MED ORDER — MIDAZOLAM HCL 2 MG/2ML IJ SOLN
INTRAMUSCULAR | Status: DC | PRN
  Administered 2019-08-04: 2 mg via INTRAVENOUS

## 2019-08-04 MED ORDER — ROCURONIUM BROMIDE 10 MG/ML (PF) SYRINGE
PREFILLED_SYRINGE | INTRAVENOUS | Status: AC
  Filled 2019-08-04: qty 10

## 2019-08-04 MED ORDER — SUGAMMADEX SODIUM 200 MG/2ML IV SOLN
INTRAVENOUS | Status: DC | PRN
  Administered 2019-08-04: 200 mg via INTRAVENOUS

## 2019-08-04 MED ORDER — LORAZEPAM 1 MG PO TABS: 1.0000 mg | ORAL_TABLET | Freq: Two times a day (BID) | ORAL | Status: DC | PRN

## 2019-08-04 MED ORDER — CHLORTHALIDONE 25 MG PO TABS
12.5000 mg | ORAL_TABLET | Freq: Every day | ORAL | Status: DC
  Administered 2019-08-04 – 2019-08-07 (×4): 12.5 mg via ORAL
  Filled 2019-08-04 (×4): qty 1

## 2019-08-04 MED ORDER — PROMETHAZINE HCL 25 MG/ML IJ SOLN: 6.2500 mg | INTRAMUSCULAR | Status: DC | PRN

## 2019-08-04 MED ORDER — FLUTICASONE FUROATE-VILANTEROL 100-25 MCG/INH IN AEPB
1.0000 | INHALATION_SPRAY | Freq: Every day | RESPIRATORY_TRACT | Status: DC
  Filled 2019-08-04: qty 28

## 2019-08-04 MED ORDER — METHYLENE BLUE 0.5 % INJ SOLN
INTRAVENOUS | Status: AC
  Filled 2019-08-04: qty 10

## 2019-08-04 MED ORDER — OXYBUTYNIN CHLORIDE 5 MG PO TABS
5.0000 mg | ORAL_TABLET | Freq: Two times a day (BID) | ORAL | Status: DC
  Administered 2019-08-04 – 2019-08-07 (×6): 5 mg via ORAL
  Filled 2019-08-04 (×6): qty 1

## 2019-08-04 MED ORDER — DEXAMETHASONE SODIUM PHOSPHATE 10 MG/ML IJ SOLN
INTRAMUSCULAR | Status: AC
  Filled 2019-08-04: qty 1

## 2019-08-04 MED ORDER — STERILE WATER FOR INJECTION IJ SOLN
INTRAMUSCULAR | Status: AC
  Filled 2019-08-04: qty 10

## 2019-08-04 MED ORDER — PHENYLEPHRINE 40 MCG/ML (10ML) SYRINGE FOR IV PUSH (FOR BLOOD PRESSURE SUPPORT)
PREFILLED_SYRINGE | INTRAVENOUS | Status: AC
  Filled 2019-08-04: qty 10

## 2019-08-04 MED ORDER — DIPHENHYDRAMINE HCL 50 MG/ML IJ SOLN
25.0000 mg | Freq: Four times a day (QID) | INTRAMUSCULAR | Status: DC | PRN
  Administered 2019-08-28: 25 mg via INTRAVENOUS
  Filled 2019-08-04: qty 1

## 2019-08-04 SURGICAL SUPPLY — 97 items
BAG URO CATCHER STRL LF (MISCELLANEOUS) ×3 IMPLANT
BLADE EXTENDED COATED 6.5IN (ELECTRODE) IMPLANT
CANNULA REDUC XI 12-8 STAPL (CANNULA)
CANNULA REDUCER 12-8 DVNC XI (CANNULA) IMPLANT
CATH FOLEY 3WAY  5CC 16FR (CATHETERS) ×1
CATH FOLEY 3WAY 5CC 16FR (CATHETERS) ×2 IMPLANT
CELLS DAT CNTRL 66122 CELL SVR (MISCELLANEOUS) IMPLANT
CLOTH BEACON ORANGE TIMEOUT ST (SAFETY) ×3 IMPLANT
COVER SURGICAL LIGHT HANDLE (MISCELLANEOUS) ×6 IMPLANT
COVER TIP SHEARS 8 DVNC (MISCELLANEOUS) ×2 IMPLANT
COVER TIP SHEARS 8MM DA VINCI (MISCELLANEOUS) ×1
COVER WAND RF STERILE (DRAPES) ×3 IMPLANT
DECANTER SPIKE VIAL GLASS SM (MISCELLANEOUS) IMPLANT
DERMABOND ADVANCED (GAUZE/BANDAGES/DRESSINGS) ×1
DERMABOND ADVANCED .7 DNX12 (GAUZE/BANDAGES/DRESSINGS) ×2 IMPLANT
DRAIN CHANNEL 19F RND (DRAIN) IMPLANT
DRAPE ARM DVNC X/XI (DISPOSABLE) ×8 IMPLANT
DRAPE COLUMN DVNC XI (DISPOSABLE) ×2 IMPLANT
DRAPE DA VINCI XI ARM (DISPOSABLE) ×4
DRAPE DA VINCI XI COLUMN (DISPOSABLE) ×1
DRAPE SURG IRRIG POUCH 19X23 (DRAPES) ×3 IMPLANT
DRSG OPSITE POSTOP 4X10 (GAUZE/BANDAGES/DRESSINGS) IMPLANT
DRSG OPSITE POSTOP 4X6 (GAUZE/BANDAGES/DRESSINGS) ×6 IMPLANT
DRSG OPSITE POSTOP 4X8 (GAUZE/BANDAGES/DRESSINGS) IMPLANT
ELECT PENCIL ROCKER SW 15FT (MISCELLANEOUS) ×3 IMPLANT
ELECT REM PT RETURN 15FT ADLT (MISCELLANEOUS) ×3 IMPLANT
ENDOLOOP SUT PDS II  0 18 (SUTURE)
ENDOLOOP SUT PDS II 0 18 (SUTURE) IMPLANT
EVACUATOR SILICONE 100CC (DRAIN) IMPLANT
GLOVE BIO SURGEON STRL SZ 6.5 (GLOVE) ×9 IMPLANT
GLOVE BIOGEL M STRL SZ7.5 (GLOVE) ×3 IMPLANT
GLOVE BIOGEL PI IND STRL 7.0 (GLOVE) ×4 IMPLANT
GLOVE BIOGEL PI INDICATOR 7.0 (GLOVE) ×2
GOWN STRL REUS W/TWL XL LVL3 (GOWN DISPOSABLE) ×15 IMPLANT
GRASPER SUT TROCAR 14GX15 (MISCELLANEOUS) IMPLANT
HOLDER FOLEY CATH W/STRAP (MISCELLANEOUS) ×3 IMPLANT
IRRIG SUCT STRYKERFLOW 2 WTIP (MISCELLANEOUS) ×3
IRRIGATION SUCT STRKRFLW 2 WTP (MISCELLANEOUS) ×2 IMPLANT
KIT PROCEDURE DA VINCI SI (MISCELLANEOUS) ×1
KIT PROCEDURE DVNC SI (MISCELLANEOUS) ×2 IMPLANT
KIT TURNOVER KIT A (KITS) ×3 IMPLANT
MANIFOLD NEPTUNE II (INSTRUMENTS) ×3 IMPLANT
NDL SAFETY ECLIPSE 18X1.5 (NEEDLE) ×2 IMPLANT
NEEDLE ASPIRATION 22 (NEEDLE) IMPLANT
NEEDLE HYPO 18GX1.5 SHARP (NEEDLE) ×1
NEEDLE INSUFFLATION 14GA 120MM (NEEDLE) ×3 IMPLANT
PACK CARDIOVASCULAR III (CUSTOM PROCEDURE TRAY) ×3 IMPLANT
PAD POSITIONING PINK XL (MISCELLANEOUS) ×3 IMPLANT
PENCIL SMOKE EVACUATOR (MISCELLANEOUS) IMPLANT
PORT LAP GEL ALEXIS MED 5-9CM (MISCELLANEOUS) ×3 IMPLANT
RELOAD STAPLER 4.3X60 GRN DVNC (STAPLE) ×4 IMPLANT
RTRCTR WOUND ALEXIS 18CM MED (MISCELLANEOUS)
SCISSORS LAP 5X35 DISP (ENDOMECHANICALS) IMPLANT
SEAL CANN UNIV 5-8 DVNC XI (MISCELLANEOUS) ×6 IMPLANT
SEAL XI 5MM-8MM UNIVERSAL (MISCELLANEOUS) ×3
SEALER VESSEL DA VINCI XI (MISCELLANEOUS) ×1
SEALER VESSEL EXT DVNC XI (MISCELLANEOUS) ×2 IMPLANT
SOLUTION ELECTROLUBE (MISCELLANEOUS) ×3 IMPLANT
STAPLER 45 DA VINCI SURE FORM (STAPLE)
STAPLER 45 SUREFORM DVNC (STAPLE) IMPLANT
STAPLER 60 DA VINCI SURE FORM (STAPLE) ×1
STAPLER 60 SUREFORM DVNC (STAPLE) ×2 IMPLANT
STAPLER CANNULA SEAL DVNC XI (STAPLE) IMPLANT
STAPLER CANNULA SEAL XI (STAPLE)
STAPLER ECHELON POWER CIR 29 (STAPLE) ×3 IMPLANT
STAPLER RELOAD 4.3X60 GREEN (STAPLE) ×2
STAPLER RELOAD 4.3X60 GRN DVNC (STAPLE) ×4
STOPCOCK 4 WAY LG BORE MALE ST (IV SETS) ×6 IMPLANT
SUT ETHILON 2 0 PS N (SUTURE) IMPLANT
SUT NOVA NAB DX-16 0-1 5-0 T12 (SUTURE) ×9 IMPLANT
SUT PROLENE 2 0 KS (SUTURE) ×3 IMPLANT
SUT SILK 2 0 (SUTURE) ×1
SUT SILK 2 0 SH CR/8 (SUTURE) IMPLANT
SUT SILK 2-0 18XBRD TIE 12 (SUTURE) ×2 IMPLANT
SUT SILK 3 0 (SUTURE)
SUT SILK 3 0 SH CR/8 (SUTURE) ×3 IMPLANT
SUT SILK 3-0 18XBRD TIE 12 (SUTURE) IMPLANT
SUT V-LOC BARB 180 2/0GR6 GS22 (SUTURE)
SUT VIC AB 2-0 SH 18 (SUTURE) IMPLANT
SUT VIC AB 2-0 SH 27 (SUTURE) ×1
SUT VIC AB 2-0 SH 27X BRD (SUTURE) ×2 IMPLANT
SUT VIC AB 3-0 SH 18 (SUTURE) IMPLANT
SUT VIC AB 4-0 PS2 27 (SUTURE) ×6 IMPLANT
SUT VICRYL 0 UR6 27IN ABS (SUTURE) ×3 IMPLANT
SUTURE V-LC BRB 180 2/0GR6GS22 (SUTURE) IMPLANT
SYR 10ML ECCENTRIC (SYRINGE) ×3 IMPLANT
SYR CONTROL 10ML LL (SYRINGE) ×3 IMPLANT
SYS LAPSCP GELPORT 120MM (MISCELLANEOUS)
SYSTEM LAPSCP GELPORT 120MM (MISCELLANEOUS) IMPLANT
TOWEL OR 17X26 10 PK STRL BLUE (TOWEL DISPOSABLE) IMPLANT
TOWEL OR NON WOVEN STRL DISP B (DISPOSABLE) ×3 IMPLANT
TRAY COLON PACK (CUSTOM PROCEDURE TRAY) ×3 IMPLANT
TRAY CYSTO PACK (CUSTOM PROCEDURE TRAY) ×3 IMPLANT
TRAY FOLEY MTR SLVR 14FR STAT (SET/KITS/TRAYS/PACK) ×3 IMPLANT
TROCAR ADV FIXATION 5X100MM (TROCAR) ×3 IMPLANT
TUBING CONNECTING 10 (TUBING) ×9 IMPLANT
TUBING INSUFFLATION 10FT LAP (TUBING) ×3 IMPLANT

## 2019-08-04 NOTE — Anesthesia Procedure Notes (Signed)
Procedure Name: Intubation Date/Time: 08/04/2019 7:36 AM Performed by: Sharlette Dense, CRNA Patient Re-evaluated:Patient Re-evaluated prior to induction Oxygen Delivery Method: Circle system utilized Preoxygenation: Pre-oxygenation with 100% oxygen Induction Type: IV induction Ventilation: Mask ventilation without difficulty and Oral airway inserted - appropriate to patient size Laryngoscope Size: Sabra Heck and 2 Grade View: Grade I Tube type: Oral Tube size: 7.5 mm Number of attempts: 1 Airway Equipment and Method: Stylet Placement Confirmation: ETT inserted through vocal cords under direct vision,  positive ETCO2 and breath sounds checked- equal and bilateral Secured at: 21 cm Tube secured with: Tape Dental Injury: Teeth and Oropharynx as per pre-operative assessment

## 2019-08-04 NOTE — Progress Notes (Signed)
Asked by patient to call brother for update.

## 2019-08-04 NOTE — H&P (Signed)
The patient is a 44 year old female who presents with diverticulitis. 44 year old female with a history of MS, on medication. She was seen last in 2018 for diverticulitis. Colonoscopy was attempted, but I was unable to complete this due to significant pain. A barium enema was negative for any other pathology. She then developed recurrent pain and was hospitalized for diverticulitis with microperforation. She did end up getting a CT-guided drain within a pelvic fluid collection. CT scans were concerning for possible inflammatory bowel disease and she has quite a bit of thickened terminal ileum and fat stranding. Colonoscopy did not show any signs of IBD. Patient is now becoming more symptomatic and is having irregular bowel movements and more significant abdominal pain.   Problem List/Past Medical  DIVERTICULITIS, COLON (K57.32)  Past Surgical History  Breast Biopsy Right. Gallbladder Surgery - Laparoscopic  Diagnostic Studies History Colonoscopy never Mammogram within last year Pap Smear 1-5 years ago  Allergies Septra *ANTI-INFECTIVE AGENTS - MISC.* Rash. Allergies Reconciled  Medication History  Ferrous Gluconate (324 (38 Fe)MG Tablet, Oral) Active. Folic Acid (1MG  Tablet, Oral) Active. Probiotic Product (Oral) Active. Omeprazole (40MG  Capsule DR, Oral) Active. Trelegy Ellipta (100-62.5-25MCG/INH Aero Pow Br Act, Inhalation) Active. Oxybutynin Chloride (Oral) Specific strength unknown - Active. BusPIRone HCl (15MG  Tablet, Oral) Active. Claritin (10MG  Tablet, Oral) Active. Aubagio (14MG  Tablet, Oral) Active. Chlorthalidone (25MG  Tablet, Oral) Active. LamoTRIgine (200MG  Tablet, Oral) Active.   Social History Alcohol use Occasional alcohol use. Caffeine use Carbonated beverages, Tea. No drug use Tobacco use Former smoker.  Family History  Arthritis Mother. Cancer Mother. Diabetes Mellitus Father. Heart Disease  Father. Hypertension Father, Mother. Respiratory Condition Father. Thyroid problems Mother.  Pregnancy / Birth History  Age at menarche 35 years. Contraceptive History Intrauterine device. Gravida 0 Para 0  Other Problems  Anxiety Disorder Back Pain Cholelithiasis High blood pressure Kidney Stone Other disease, cancer, significant illness     Review of Systems  General Present- Fatigue, Night Sweats and Weight Loss. Not Present- Appetite Loss, Chills, Fever and Weight Gain. Skin Not Present- Change in Wart/Mole, Dryness, Hives, Jaundice, New Lesions, Non-Healing Wounds, Rash and Ulcer. HEENT Not Present- Earache, Hearing Loss, Hoarseness, Nose Bleed, Oral Ulcers, Ringing in the Ears, Seasonal Allergies, Sinus Pain, Sore Throat, Visual Disturbances, Wears glasses/contact lenses and Yellow Eyes. Respiratory Not Present- Bloody sputum, Chronic Cough, Difficulty Breathing, Snoring and Wheezing. Breast Not Present- Breast Mass, Breast Pain, Nipple Discharge and Skin Changes. Cardiovascular Present- Swelling of Extremities. Not Present- Chest Pain, Difficulty Breathing Lying Down, Leg Cramps, Palpitations, Rapid Heart Rate and Shortness of Breath. Gastrointestinal Present- Abdominal Pain and Change in Bowel Habits. Not Present- Bloating, Bloody Stool, Chronic diarrhea, Constipation, Difficulty Swallowing, Excessive gas, Gets full quickly at meals, Hemorrhoids, Indigestion, Nausea, Rectal Pain and Vomiting. Musculoskeletal Present- Back Pain and Muscle Weakness. Not Present- Joint Pain, Joint Stiffness, Muscle Pain and Swelling of Extremities. Neurological Present- Numbness and Weakness. Not Present- Decreased Memory, Fainting, Headaches, Seizures, Tingling, Tremor and Trouble walking. Psychiatric Present- Anxiety and Bipolar. Not Present- Change in Sleep Pattern, Depression, Fearful and Frequent crying.  BP 123/81   Pulse 79   Temp (!) 97.5 F (36.4 C) (Oral)    Resp 18   Ht 5\' 6"  (1.676 m)   Wt 91.8 kg   LMP 07/22/2019 (Exact Date)   SpO2 98%   BMI 32.67 kg/m      Physical Exam   General Mental Status-Alert. General Appearance-Not in acute distress. Build & Nutrition-Well nourished. Posture-Normal posture. Gait-Normal.  Head  and Neck Head-normocephalic, atraumatic with no lesions or palpable masses. Trachea-midline. Thyroid Gland Characteristics - normal size and consistency and no palpable nodules.  Chest and Lung Exam Chest and lung exam reveals -on auscultation, normal breath sounds, no adventitious sounds and normal vocal resonance.  Cardiovascular Cardiovascular examination reveals -normal heart sounds, regular rate and rhythm with no murmurs and no digital clubbing, cyanosis, edema, increased warmth or tenderness.  Abdomen Inspection Inspection of the abdomen reveals - No Hernias. Palpation/Percussion Palpation and Percussion of the abdomen reveal - Soft, Non Tender, No Rigidity (guarding), No hepatosplenomegaly and No Palpable abdominal masses.  Neurologic Neurologic evaluation reveals -alert and oriented x 3 with no impairment of recent or remote memory, normal attention span and ability to concentrate, normal sensation and normal coordination.  Musculoskeletal Normal Exam - Bilateral-Upper Extremity Strength Normal and Lower Extremity Strength Normal.    Assessment & Plan   DIVERTICULITIS, COLON (K57.32) Impression: 44 year old female who presents today to discuss surgery for her diverticulitis. She is status post drain placement. She also underwent a colonoscopy which did not show any signs of malignancy within the sigmoid colon. Currently she is having symptoms consistent with a sigmoid stricture. She may also have some smoldering diverticulitis. I have recommended that we go ahead and schedule her for a robotic-assisted sigmoidectomy. We discussed the need for cystoscopy and  firefighter injection to help identify her left ureter. We discussed risk of bleeding can be higher in patients with more active diverticulitis. We also discussed that there appears to be some small bowel inflammation surrounding her sigmoid colon and we may need to perform a small bowel resection. All questions were answered. She is ready to proceed with surgery. The surgery and anatomy were described to the patient as well as the risks of surgery and the possible complications. These include: Bleeding, deep abdominal infections and possible wound complications such as hernia and infection, damage to adjacent structures, leak of surgical connections, which can lead to other surgeries and possibly an ostomy, possible need for other procedures, such as abscess drains in radiology, possible prolonged hospital stay, possible diarrhea from removal of part of the colon, possible constipation from narcotics, possible bowel, bladder or sexual dysfunction if having rectal surgery, prolonged fatigue/weakness or appetite loss, possible early recurrence of of disease, possible complications of their medical problems such as heart disease or arrhythmias or lung problems, death (less than 1%). I believe the patient understands and wishes to proceed with the surgery.

## 2019-08-04 NOTE — Transfer of Care (Signed)
Immediate Anesthesia Transfer of Care Note  Patient: Sandra Bonilla  Procedure(s) Performed: XI ROBOT ASSISTED SIGMOIDECTOMY (N/A Abdomen) FIREFLY INJECTION (N/A )  Patient Location: PACU  Anesthesia Type:General  Level of Consciousness: awake, alert  and oriented  Airway & Oxygen Therapy: Patient Spontanous Breathing and Patient connected to face mask oxygen  Post-op Assessment: Report given to RN and Post -op Vital signs reviewed and stable  Post vital signs: Reviewed and stable  Last Vitals:  Vitals Value Taken Time  BP 123/85 08/04/19 1112  Temp    Pulse 74 08/04/19 1114  Resp 13 08/04/19 1114  SpO2 100 % 08/04/19 1114  Vitals shown include unvalidated device data.  Last Pain:  Vitals:   08/04/19 0626  TempSrc:   PainSc: 0-No pain      Patients Stated Pain Goal: 4 ([id number])  Complications: No apparent anesthesia complications

## 2019-08-04 NOTE — Op Note (Signed)
08/04/2019  11:25 AM  PATIENT:  Sandra Bonilla  44 y.o. female  Patient Care Team: [name], MD as PCP - General (Internal Medicine) [name], MD as Consulting Physician (Colon and Rectal Surgery)  PRE-OPERATIVE DIAGNOSIS:  COMPLICATED DIVERTICULITIS  POST-OPERATIVE DIAGNOSIS:  COMPLICATED DIVERTICULITIS  PROCEDURE:  XI ROBOT ASSISTED SIGMOIDECTOMY INTRAOPERATIVE ASSESSMENT OF PERFUSION LYSIS OF ADHESIONS    Surgeon(s): [name], MD Ardis Hughs, MD Ileana Roup, MD  ASSISTANT: Dr Dema Severin   ANESTHESIA:   local and general  EBL: 75ml Total I/O In: 1500 [I.V.:1500] Out: 150 [Urine:75; Blood:75]  Delay start of Pharmacological VTE agent (>24hrs) due to surgical blood loss or risk of bleeding:  no  DRAINS: none   SPECIMEN:  Source of Specimen:  Sigmoid colon  DISPOSITION OF SPECIMEN:  PATHOLOGY  COUNTS:  YES  PLAN OF CARE: Admit to inpatient   PATIENT DISPOSITION:  PACU - hemodynamically stable.  INDICATION:    44 y.o. F with h/o perforated diverticulitis and persistent symptoms.  I recommended segmental resection:  The anatomy & physiology of the digestive tract was discussed.  The pathophysiology was discussed.  Natural history risks without surgery was discussed.   I worked to give an overview of the disease and the frequent need to have multispecialty involvement.  I feel the risks of no intervention will lead to serious problems that outweigh the operative risks; therefore, I recommended a partial colectomy to remove the pathology.  Laparoscopic & open techniques were discussed.   Risks such as bleeding, infection, abscess, leak, reoperation, possible ostomy, hernia, heart attack, death, and other risks were discussed.  I noted a good likelihood this will help address the problem.   Goals of post-operative recovery were discussed as well.    The patient expressed understanding & wished to proceed with surgery.  OR FINDINGS:    Patient had significant adhesions of small bowel, colon, uterus and ovaries in the pelvis.  The anastomosis rests ~15 cm from the anal verge by rigid proctoscopy.  DESCRIPTION:   Informed consent was confirmed.  The patient underwent general anaesthesia without difficulty.  Cystoscopy was performed by urology.  This note will be dictated separately.  Firefly injection was performed to both ureters for a identification intraoperatively.  There was a possible fistulous connection noted in the dome of the bladder on the right side.  The patient was positioned appropriately.  VTE prevention in place.  The patient's abdomen was clipped, prepped, & draped in a sterile fashion.  Surgical timeout confirmed our plan.  The patient was positioned in reverse Trendelenburg.  Abdominal entry was gained using a Varies needle in the LUQ.  Entry was clean.  I induced carbon dioxide insufflation.  An 73mm robotic port was placed in the RUQ.  Camera inspection revealed no injury.  Extra ports were carefully placed under direct laparoscopic visualization.  I laparoscopically reflected the greater omentum and the upper abdomen the small bowel in the upper abdomen. The patient was appropriately positioned and the robot was docked to the patient's left side.  Instruments were placed under direct visualization.   I began by mobilizing the omentum out of the pelvis using blunt dissection.  There was a loop of small intestines that was adherent to what appeared to be the anterior dome of the bladder.  Over the next 30 minutes I lysed adhesions of the small bowel to take down this loop from the abdominal wall.  I used scissors to sharply divide the  peritoneum from the small bowel.  I encountered an abscess with purulence and fecal material.  Once all the small intestines was freed, this was placed back into the upper abdomen for further evaluation later.  I then turned my attention to the sigmoid colon.   I mobilized the  sigmoid colon off of the pelvic sidewall.  I scored the base of peritoneum of the right side of the mesentery of the left colon from the ligament of Treitz to the peritoneal reflection of the mid rectum.  The patient had significant adhesions to the left side of the pelvis.  I elevated the sigmoid mesentery and enetered into the retro-mesenteric plane. We were able to identify the left ureter and gonadal vessels. We kept those posterior within the retroperitoneum and elevated the left colon mesentery off that. I did isolated IMA pedicle but did not ligate it yet.  I continued distally and got into the avascular plane posterior to the mesorectum. This allowed me to help mobilize the rectum as well by freeing the mesorectum off the sacrum.  I mobilized the peritoneal coverings towards the peritoneal reflection on both the right and left sides of the rectum.  I could see the right and left ureters and stayed away from them.    I skeletonized the inferior mesenteric artery pedicle.  After confirming the left ureter was out of the way, I went ahead and ligated the inferior mesenteric artery pedicle with bipolar robotic vessel sealer well above its takeoff from the aorta.   We ensured hemostasis. I skeletonized the mesorectum at the junction at the proximal rectum using blunt dissection & bipolar robotic vessel sealer.  I mobilized the left colon in a lateral to medial fashion off the line of Toldt up towards the splenic flexure to ensure good mobilization of the left colon to reach into the pelvis.  Once the mesentery was divided, I continue to mobilize the colon off of the right and left pelvic sidewalls using blunt dissection and the robotic vessel sealer.  Identified the right adnexa and freed these from the colonic structures and epiploic.  The proximal rectum was adherent to what appeared to be the left adnexa structures.  I was able to free enough rectum for our anastomosis.  I did not dissect any further.  We  then placed a 12 mm suprapubic port and I used a green load robotic stapler x2 to divide the rectosigmoid junction.  At this point I did perform a rigid sigmoidoscopy to evaluate the rectum and confirmed there was no injury to the rectal wall.  This all appeared normal and without injury.  The vaginal structures were also palpated and there did not appear to be any vaginal wall defects.  At this point we insufflated the bladder to evaluate for any defect in the bladder wall.  This was done using methylene blue tinted saline.  We backfilled the bladder with approximately 500 mL of urine and saw no leak.  I then had the CRNA to inject firefly intravenously to evaluate perfusion of the remaining colon and rectum.  There was good perfusion noted to the level of dissection in the sigmoid descending colon junction.  The remaining rectum also appeared to have good perfusion.  We then undocked the robot.  The suprapubic port was enlarged to a Pfannenstiel incision.  An [place] wound protector was placed.  Identified the portion of previously dissected small bowel and brought this out through the Pfannenstiel incision.  This was inspected.  There  was no sign of injury.  I took down the small bowel adhesions using Metzenbaum scissors.  This was all inspected again carefully and no injuries were noted.  This was then placed back into the abdomen.  The colon was then brought out of the abdomen.  A pursestring device was placed and the colon was transected and sent to pathology for further examination.  A 29 mm EEA anvil was then placed into the colon and the pursestring was tied tightly around this.  The fat was cleared from around the colon edge and this was placed back into the abdomen.  The EEA stapler was then inserted into the rectum and the spike was brought out through the staple line distally.  An anastomosis was created.  There was no tension noted.  There was no leak when tested with insufflation under irrigation.   The anastomosis rests approximately 15 cm from the anal verge.  We then switched to clean gowns, gloves, instruments and drapes.  The abdomen was irrigated with normal saline.  Hemostasis was good.  The peritoneum of the Pfannenstiel incision was then closed using a running 0 Vicryl suture.  The fascia was closed using interrupted #1 Novafil sutures.  The subcutaneous tissue was reapproximated using interrupted 2-0 Vicryl sutures.  The skin was closed using a running 4-0 Vicryl subcuticular suture.  A sterile dressing was applied.  The remaining port sites were closed using 4-0 Vicryl subcuticular sutures and Dermabond.  The patient was then awakened from anesthesia and sent to the postanesthesia care unit in stable condition.  All counts were correct per operating room staff.  An MD assistant was necessary for tissue manipulation, retraction and positioning due to the complexity of the case and hospital policies

## 2019-08-04 NOTE — Op Note (Signed)
Preoperative diagnosis:  1. Pelvic Abscess  2.   Postoperative diagnosis:  1. Same   Procedure: 1. Cystoscopy, retrograde pyelogram with interpretation 2. 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 #2 normal orthotopic ureters - 7.29mL of firefly contrast injected into each ureter  EBL: Minimal   Specimens: None   Indication:Sandra Bonilla is a 44 y.o.  patient with diverticular abscess/colovesical fistula.  Dr. [name] requested temporary ureteral stents to help facilitate the dissection of the sigmoid colon.  After reviewing the management options for treatment, he elected to proceed with the above surgical procedure(s). We have discussed the potential benefits and risks of the procedure, side effects of the proposed treatment, the likelihood of the patient achieving the goals of the procedure, and any potential problems that might occur during the procedure or recuperation. Informed consent has been obtained.   Description of procedure:   The patient was taken to the operating room and general anesthesia was induced.  The patient was placed in the dorsal lithotomy position, prepped and draped in the usual sterile fashion, and preoperative antibiotics were administered. A preoperative time-out was performed.    A 21 French 30 degree cystoscope was gently passed through the patient's urethra into the bladder.  The bladder was subsequently emptied and then filled slowly up performing a 360 degrees cystoscopic evaluation.  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   I then advanced a 5 [place] open-ended ureteral catheter into the patient's left ureteral orifice and instilled 7.32ml of firefly contrast and  then repeated this on the patient's right side.  I placed a 78F foley.   The surgery was then turned over to Dr. [name]  for facilitation of the remainder of the case.

## 2019-08-05 ENCOUNTER — Encounter: Payer: Self-pay | Admitting: *Deleted

## 2019-08-05 LAB — CBC
HCT: 36.8 % (ref 36.0–46.0)
Hemoglobin: 11.5 g/dL — ABNORMAL LOW (ref 12.0–15.0)
MCH: 27.1 pg (ref 26.0–34.0)
MCHC: 31.3 g/dL (ref 30.0–36.0)
MCV: 86.6 fL (ref 80.0–100.0)
Platelets: 308 10*3/uL (ref 150–400)
RBC: 4.25 MIL/uL (ref 3.87–5.11)
RDW: 16.4 % — ABNORMAL HIGH (ref 11.5–15.5)
WBC: 14.8 10*3/uL — ABNORMAL HIGH (ref 4.0–10.5)
nRBC: 0 % (ref 0.0–0.2)

## 2019-08-05 LAB — BASIC METABOLIC PANEL
Anion gap: 9 (ref 5–15)
BUN: 8 mg/dL (ref 6–20)
CO2: 27 mmol/L (ref 22–32)
Calcium: 7.9 mg/dL — ABNORMAL LOW (ref 8.9–10.3)
Chloride: 102 mmol/L (ref 98–111)
Creatinine, Ser: 0.81 mg/dL (ref 0.44–1.00)
GFR calc Af Amer: >60 mL/min (ref >60)
GFR calc non Af Amer: >60 mL/min (ref >60)
Glucose, Bld: 129 mg/dL — ABNORMAL HIGH (ref 70–99)
Potassium: 3.6 mmol/L (ref 3.5–5.1)
Sodium: 138 mmol/L (ref 135–145)

## 2019-08-05 LAB — SURGICAL PATHOLOGY

## 2019-08-05 NOTE — Plan of Care (Signed)

## 2019-08-05 NOTE — Progress Notes (Signed)
1 Day Post-Op robotic sigmoidectomy Subjective: Had a BM.  Tolerating liquids.  Had some nausea last night.  Pain controlled  Objective: Vital signs in last 24 hours: Temp:  [97.4 F (36.3 C)-98.8 F (37.1 C)] 97.4 F (36.3 C) (05/07 0536) Pulse Rate:  [66-95] 78 (05/07 0536) Resp:  [14-18] 18 (05/07 0536) BP: (105-128)/(70-106) 113/72 (05/07 0536) SpO2:  [94 %-100 %] 98 % (05/07 0536) Weight:  [96.5 kg] 96.5 kg (05/07 0500)   Intake/Output from previous day: 05/06 0701 - 05/07 0700 In: 3150.8 [P.O.:320; I.V.:2730.8; IV Piggyback:100] Out: 825 [Urine:750; Blood:75] Intake/Output this shift: No intake/output data recorded.   General appearance: alert and cooperative GI: normal findings: soft, non-distended  Incision: no significant drainage  Lab Results:  Recent Labs    08/05/19 0414  WBC 14.8*  HGB 11.5*  HCT 36.8  PLT 308   BMET Recent Labs    08/05/19 0414  NA 138  K 3.6  CL 102  CO2 27  GLUCOSE 129*  BUN 8  CREATININE 0.81  CALCIUM 7.9*   PT/INR No results for input(s): LABPROT, INR in the last 72 hours. ABG No results for input(s): PHART, HCO3 in the last 72 hours.  Invalid input(s): PCO2, PO2  MEDS, Scheduled . acetaminophen  1,000 mg Oral Q6H  . alvimopan  12 mg Oral BID  . chlorthalidone  12.5 mg Oral Daily  . enoxaparin (LOVENOX) injection  40 mg Subcutaneous Q24H  . feeding supplement  237 mL Oral BID BM  . fluticasone furoate-vilanterol  1 puff Inhalation Daily   And  . umeclidinium bromide  1 puff Inhalation Daily  . gabapentin  300 mg Oral BID  . oxybutynin  5 mg Oral BID  . pantoprazole  80 mg Oral Daily  . potassium chloride  10 mEq Oral Daily  . saccharomyces boulardii  250 mg Oral BID  . Teriflunomide  1 tablet Oral Daily    Studies/Results: No results found.  Assessment: s/p Procedure(s): XI ROBOT ASSISTED SIGMOIDECTOMY FIREFLY INJECTION Patient Active Problem List   Diagnosis Date Noted  . Diverticular disease  08/04/2019  . 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 Absolute anemia 06/25/2019  . Change in bowel habits 06/25/2019  . IUD threads lost 01/26/2019  . History of abnormal cervical Pap smear 01/25/2019  . Hx of diverticulitis of colon 12/24/2018  . Dyspepsia 12/24/2018  . Non-intractable vomiting 12/24/2018  . Abnormal CT scan, pelvis 12/24/2018  . Urinary dysfunction 12/30/2017  . Varicose veins of bilateral lower extremities with pain 10/28/2017  . Low grade squamous intraepithelial lesion (LGSIL) on cervical Pap smear 10/29/2016  . 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 Head injury 06/25/2016  . Essential hypertension 04/22/2016  . Diverticulitis of large intestine with complication   . Multiple sclerosis ([place])   . Diverticulitis 04/21/2016  . Other headache syndrome 02/05/2016  . Vitamin D deficiency 10/09/2015  . Other fatigue 10/09/2015  . Dysesthesia 04/11/2015  . Depression with anxiety 04/11/2015  . Gait disturbance 12/13/2014  . Insomnia 12/13/2014  . Multiple sclerosis, relapsing-remitting ([place]) 08/30/2012  . Relapsing remitting multiple sclerosis ([place]) 08/30/2012    Expected post op course  Plan: d/c foley  Advance to soft diet as tolerated SL IVF's Ambulate in hall Probable d/c over the next 24-48h   LOS: 1 day     .Rosario Adie, MD [facility], [place]    08/05/2019 7:50 AM

## 2019-08-05 NOTE — Discharge Instructions (Addendum)
ABDOMINAL SURGERY: POST OP INSTRUCTIONS  1. DIET:   Be sure to include lots of fluids daily.  Avoid fast food or heavy meals as your are more likely to get nauseated.   2. Take your usually prescribed home medications unless otherwise directed. 3. PAIN CONTROL: a. Pain is best controlled by a usual combination of three different methods TOGETHER: i. Ice/Heat ii. Over the counter pain medication iii. Prescription pain medication b. Most patients will experience some swelling and bruising around the incisions.  Ice packs or heating pads (30-60 minutes up to 6 times a day) will help. Use ice for the first few days to help decrease swelling and bruising, then switch to heat to help relax tight/sore spots and speed recovery.  Some people prefer to use ice alone, heat alone, alternating between ice & heat.  Experiment to what works for you.  Swelling and bruising can take several weeks to resolve.   c. It is helpful to take an over-the-counter pain medication regularly for the first few weeks.  Choose one of the following that works best for you: i. Naproxen (Aleve, etc)  Two 220mg  tabs twice a day ii. Ibuprofen (Advil, etc) Three 200mg  tabs four times a day (every meal & bedtime) iii. Acetaminophen (Tylenol, etc) 500-650mg  four times a day (every meal & bedtime) d. A  prescription for pain medication (such as oxycodone, hydrocodone, etc) should be given to you upon discharge.  Take your pain medication as prescribed.  i. If you are having problems/concerns with the prescription medicine (does not control pain, nausea, vomiting, rash, itching, etc), please call us [phone] to see if we need to switch you to a different pain medicine that will work better for you and/or control your side effect better. ii. If you need a refill on your pain medication, please contact your pharmacy.  They will contact our office to request authorization. Prescriptions will not be filled after 5 pm or on  week-ends. 4. Watch out for diarrhea.  Use an anti-diarrheal medications (Imodium) 2mg  prior to meals.  If this does not improve, please call us. 5. Wash / shower every day.  You may shower over the incision / wound.  Avoid baths until the skin is fully healed.  Continue to shower over incision(s) after the dressing is off. 6. Remove your waterproof bandages 5 days after surgery.  You may leave the incision open to air.  You may replace a dressing/Band-Aid to cover the incision for comfort if you wish. 7. ACTIVITIES as tolerated:   a. You may resume regular (light) daily activities beginning the next day--such as daily self-care, walking, climbing stairs--gradually increasing activities as tolerated.  If you can walk 30 minutes without difficulty, it is safe to try more intense activity such as jogging, treadmill, bicycling, low-impact aerobics, swimming, etc. b. Save the most intensive and strenuous activity for last such as sit-ups, heavy lifting, contact sports, etc  Refrain from any heavy lifting or straining until you are off narcotics for pain control.   c. DO NOT PUSH THROUGH PAIN.  Let pain be your guide: If it hurts to do something, don't do it.  Pain is your body warning you to avoid that activity for another week until the pain goes down. d. You may drive when you are no longer taking prescription pain medication, you can comfortably wear a seatbelt, and you can safely maneuver your car and apply brakes. e. Dennis Bast may have sexual intercourse when it is comfortable.  8. FOLLOW UP in our office a. Please call [facility] at [phone] to set up an appointment to see your surgeon in the office for a follow-up appointment approximately 1-2 weeks after your surgery. b. Make sure that you call for this appointment the day you arrive home to insure a convenient appointment time. 10. IF YOU HAVE DISABILITY OR FAMILY LEAVE FORMS, BRING THEM TO THE OFFICE FOR PROCESSING.  DO NOT GIVE THEM TO YOUR  DOCTOR.   WHEN TO CALL us [phone]: 1. Poor pain control 2. Reactions / problems with new medications (rash/itching, nausea, etc)  3. Fever over 101.5 F (38.5 C) 4. Inability to urinate 5. Nausea and/or vomiting 6. Worsening swelling or bruising 7. Continued bleeding from incision. 8. Increased pain, redness, or drainage from the incision  The clinic staff is available to answer your questions during regular business hours (8:30am-5pm).  Please don't hesitate to call and ask to speak to one of our nurses for clinical concerns.   A surgeon from [facility] is always on call at the hospitals   If you have a medical emergency, go to the nearest emergency room or call 911.    [facility], [place], [place], [place], [place]  [address] ? MAIN: [phone] ? TOLL FREE: [phone] ? FAX [phone] [email or web address]    Ileostomy Nutrition Therapy  During ileostomy surgery, the entire colon, rectum, and anus are removed or bypassed. The part  of the small intestine called the ileum is brought through the abdominal wall, creating a stoma.  The stoma is an opening in the abdomen that must be covered with a bag to collect stools at all  times. It can be temporary or permanent depending on the surgery. After surgery, your bowel will be swollen. Your eating plan will begin with a diet of clear  liquids. As you recover, you will start eating solid foods, beginning with foods that are low in  fiber (see Recommended Foods). You should avoid high-fiber foods because they are harder to  digest. Avoiding high-fiber foods will allow the bowel to heal and prevent blockage of the  ileostomy. You should have less than 8 grams of fiber per day when you move from liquids to solid foods  and then transition to less than 13 grams of fiber for the whole day in the next few days as your  symptoms decrease. Most patients begin to eat more  normally 6 weeks after surgery. The changes to your diet recommended on this handout can help reduce symptoms such as  diarrhea, odor, and gas; help you avoid a blockage; and help your body get more nutrients from  your food as you heal from surgery.  Tips . You may not feel like eating much, so eat small amounts every 2 to 4 hours. Keep a  regular schedule for meals and snacks to help reduce gas and help your body absorb  nutrients from foods. . Let your health care provider know if you see whole foods or pills in your ostomy bag. . Do not use time-released, enteric-coated medications or very large tablets. Also avoid  laxatives, which can cause dehydration. . Take a chewable (non-gummy) multivitamin with minerals daily. . Take a chewable or liquid calcium supplement. Liquid calcium citrate can be taken with  food or without food. . Have your largest meal in the middle of the day. Do not eat large amounts in the evening.  This can help decrease  stool output at night, so that you can limit emptying the ostomy  bag then. . Eat foods that may thicken stool several times a day. Some foods can change the color of  the stool. (See the Food Selection Guide). When you begin to add more variety back into your diet, add only 1 new food every few  days. If there are foods that bothered you before surgery, add other foods first. Eat only a  small amount when you re-try foods. If a food makes you feel sick, wait a few weeks and  then re-try it. Keep a log of foods you try and how you feel when eating them. . To reduce gas, avoid chewing gum, drinking with straws and drinking carbonated  beverages, smoking or chewing tobacco, eating too fast, and skipping meals. Missing  meals can increase gas and watery stools.  . Some foods may cause blockages (see Food Selection Guide). Use caution when eating  these foods. Eat small amounts and chew your foods well to prevent blockage. . Get enough fluids. Aim  for at least 8 to 10 cups of liquid per day. Drink liquids 30  minutes after meals or snacks to avoid flushing foods through your system too quickly. . During times of higher output (1,800 milliliters per day or more) or heavy sweating, you  need to drink more fluids. You may need to actually measure how much you are drinking  and your output from your ostomy when it is high: o 1 ounce is 30 milliliters o 1 cup is 8 ounces o 8 cups is 64 ounces or 2 quarts or 2 liters . Watch for signs and symptoms of fluid-electrolyte imbalance. If symptoms occur, seek  treatment right away. Symptoms include: o Dry mouth o Reduced urine output (not as much urine or urinating less often than normal) o Dark-colored urine o Feeling dizzy when you stand up o Noticeable fatigue (feeling extremely tired) o Abdominal cramping . If you have a high output ostomy, you may need to use an oral rehydration solution  (ORS) to replace fluid loss. The World Health Organization has a solution in powder  form you can buy (called Oral Rehydration Salts). Some sports drinks can increase stoma  output, so pediatric electrolyte solutions, such as Pedialyte, are recommended instead. A  less expensive option is to make the oral rehydration solution yourself using one of the  following recipes: o 2 cups Gatorade + 2 cups water +  teaspoon salt o 3 cups water + 1 cup orange juice +  teaspoon salt +  teaspoon baking soda o  cup grape juice or cranberry juice + 3 cups water +  teaspoon salt o 1 cup apple juice + 3 cups water +  teaspoon salt o 4 cups (1 liter) water +  teaspoon table salt + 6 level teaspoons sugar (World  Health Organization's ORS recipe) Your registered dietitian nutritionist or doctor may suggest increasing foods that are  higher in sodium and potassium. Remember to choose lower fiber foods that are high in potassium. Some examples of high-potassium foods include soy milk, yogurt, cottage  cheese,  potatoes without skin, liquid supplements (such as Ensure Muscle Health), orange  juice or tomato juice (if these do not cause reflux or other symptoms), smooth peanut  butter, and baked or broiled salmon or [place]. Salt substitute that contains potassium  chloride can also be used, but be careful not to overuse it. Higher-sodium foods added to  the diet should also  be lower fiber and not fried.  Foods Not Recommended . When you first start eating solid foods, avoid foods that are high in fiber, such as whole  grains, dried beans, and most raw vegetables and fruits. (See Foods Recommended list  for low-fiber foods. See Food Selection Guide lists for foods that cause symptoms.) . Avoid acidic, spicy, fried, and greasy foods and foods high in sugar. . Limit food and drinks that contain sugar substitutes. Often diluted sugar-containing  drinks do better. Cut fruit juice in half by adding an equal amount of water or more. . While you heal, avoid any foods that cause you to have odor, gas, diarrhea, or  obstruction. . You should not have any salad or raw vegetables. . If you have kidney stones, you should try to avoid foods that are high in oxalate. Your  RDN may give you a more detailed list of high-oxalate foods if you are at risk. Some  foods that are high in oxalate include: o Grains: Wheat bran, wheat germ, whole wheat flour o Protein Foods: Beans, tofu, nuts o Vegetables: Beets, dark leafy greens, sweet potato  Beverages: Beer, cocoa, instant tea, instant coffee o Other: Carob, chocolate  From Academy of Nutrition and Dietetics

## 2019-08-05 NOTE — Plan of Care (Signed)

## 2019-08-06 LAB — BASIC METABOLIC PANEL
Anion gap: 9 (ref 5–15)
Anion gap: 7 (ref 5–15)
BUN: 10 mg/dL (ref 6–20)
BUN: 12 mg/dL (ref 6–20)
CO2: 26 mmol/L (ref 22–32)
CO2: 26 mmol/L (ref 22–32)
Calcium: 7.6 mg/dL — ABNORMAL LOW (ref 8.9–10.3)
Calcium: 7.5 mg/dL — ABNORMAL LOW (ref 8.9–10.3)
Chloride: 100 mmol/L (ref 98–111)
Chloride: 101 mmol/L (ref 98–111)
Creatinine, Ser: 0.7 mg/dL (ref 0.44–1.00)
Creatinine, Ser: 0.8 mg/dL (ref 0.44–1.00)
GFR calc Af Amer: >60 mL/min (ref >60)
GFR calc Af Amer: >60 mL/min (ref >60)
GFR calc non Af Amer: >60 mL/min (ref >60)
GFR calc non Af Amer: >60 mL/min (ref >60)
Glucose, Bld: 105 mg/dL — ABNORMAL HIGH (ref 70–99)
Glucose, Bld: 123 mg/dL — ABNORMAL HIGH (ref 70–99)
Potassium: 3.4 mmol/L — ABNORMAL LOW (ref 3.5–5.1)
Potassium: 3.8 mmol/L (ref 3.5–5.1)
Sodium: 135 mmol/L (ref 135–145)
Sodium: 134 mmol/L — ABNORMAL LOW (ref 135–145)

## 2019-08-06 LAB — CBC
HCT: 42.2 % (ref 36.0–46.0)
HCT: 42.7 % (ref 36.0–46.0)
Hemoglobin: 12.9 g/dL (ref 12.0–15.0)
Hemoglobin: 13.4 g/dL (ref 12.0–15.0)
MCH: 26.8 pg (ref 26.0–34.0)
MCH: 27.3 pg (ref 26.0–34.0)
MCHC: 30.6 g/dL (ref 30.0–36.0)
MCHC: 31.4 g/dL (ref 30.0–36.0)
MCV: 87.6 fL (ref 80.0–100.0)
MCV: 87 fL (ref 80.0–100.0)
Platelets: 256 10*3/uL (ref 150–400)
Platelets: 306 10*3/uL (ref 150–400)
RBC: 4.82 MIL/uL (ref 3.87–5.11)
RBC: 4.91 MIL/uL (ref 3.87–5.11)
RDW: 16.8 % — ABNORMAL HIGH (ref 11.5–15.5)
RDW: 16.8 % — ABNORMAL HIGH (ref 11.5–15.5)
WBC: 17.3 10*3/uL — ABNORMAL HIGH (ref 4.0–10.5)
WBC: 17.6 10*3/uL — ABNORMAL HIGH (ref 4.0–10.5)
nRBC: 0 % (ref 0.0–0.2)
nRBC: 0 % (ref 0.0–0.2)

## 2019-08-06 MED ORDER — POTASSIUM CHLORIDE IN NACL 20-0.9 MEQ/L-% IV SOLN
INTRAVENOUS | Status: DC
  Filled 2019-08-06 (×9): qty 1000

## 2019-08-06 MED ORDER — SODIUM CHLORIDE 0.9 % IV BOLUS
1000.0000 mL | Freq: Once | INTRAVENOUS | Status: AC
  Administered 2019-08-06: 1000 mL via INTRAVENOUS

## 2019-08-06 NOTE — Progress Notes (Signed)
Pharmacy sent secure home med via tube station. Med obtained and placed in Patients personal daily bin in med room. Last one available per pharmacist.  Day shift nurse made aware.

## 2019-08-06 NOTE — Progress Notes (Signed)
2 Days Post-Op   Subjective/Chief Complaint: Reports having a bad day yesterday with abdominal pain and loose BM's Pain less this morning No nausea this morning   Objective: Vital signs in last 24 hours: Temp:  [97.4 F (36.3 C)-98.7 F (37.1 C)] 98.2 F (36.8 C) (05/08 0621) Pulse Rate:  [76-110] 93 (05/08 0621) Resp:  [16-18] 16 (05/08 0621) BP: (109-122)/(71-86) 122/86 (05/08 0621) SpO2:  [91 %-100 %] 98 % (05/08 0621) Weight:  [94.6 kg] 94.6 kg (05/08 0621) Last BM Date: 08/04/19  Intake/Output from previous day: 05/07 0701 - 05/08 0700 In: 2018.5 [P.O.:1160; I.V.:758.5; IV Piggyback:100] Out: 1100 [Urine:1100] Intake/Output this shift: No intake/output data recorded.  Exam: Awake and alert Looks fairly comfortable Abdomen soft, minimally tender, no peritoneal signs  Lab Results:  Recent Labs    08/05/19 0414 08/06/19 0446  WBC 14.8* 17.3*  HGB 11.5* 12.9  HCT 36.8 42.2  PLT 308 256   BMET Recent Labs    08/05/19 0414 08/06/19 0446  NA 138 135  K 3.6 3.4*  CL 102 100  CO2 27 26  GLUCOSE 129* 105*  BUN 8 10  CREATININE 0.81 0.70  CALCIUM 7.9* 7.6*   PT/INR No results for input(s): LABPROT, INR in the last 72 hours. ABG No results for input(s): PHART, HCO3 in the last 72 hours.  Invalid input(s): PCO2, PO2  Studies/Results: No results found.  Anti-infectives: Anti-infectives (From admission, onward)   Start     Dose/Rate Route Frequency Ordered Stop   08/04/19 2000  cefoTEtan (CEFOTAN) 2 g in sodium chloride 0.9 % 100 mL IVPB     2 g 200 mL/hr over 30 Minutes Intravenous Every 12 hours 08/04/19 1204 08/04/19 2107   08/04/19 0615  cefoTEtan (CEFOTAN) 2 g in sodium chloride 0.9 % 100 mL IVPB     2 g 200 mL/hr over 30 Minutes Intravenous On call to O.R. 08/04/19 0604 08/04/19 1639      Assessment/Plan: s/p Procedure(s): XI ROBOT ASSISTED SIGMOIDECTOMY (N/A) FIREFLY INJECTION (N/A)  Given discomfort and elevated WBC, will continue to  watch in the hospital.  If WBC up more tomorrow, may have to check a CT scan. Continue current diet   LOS: 2 days    Coralie Keens 08/06/2019

## 2019-08-07 ENCOUNTER — Inpatient Hospital Stay (HOSPITAL_COMMUNITY): Payer: BC Managed Care – PPO

## 2019-08-07 LAB — BASIC METABOLIC PANEL
Anion gap: 10 (ref 5–15)
BUN: 14 mg/dL (ref 6–20)
CO2: 23 mmol/L (ref 22–32)
Calcium: 7.3 mg/dL — ABNORMAL LOW (ref 8.9–10.3)
Chloride: 102 mmol/L (ref 98–111)
Creatinine, Ser: 0.8 mg/dL (ref 0.44–1.00)
GFR calc Af Amer: >60 mL/min (ref >60)
GFR calc non Af Amer: >60 mL/min (ref >60)
Glucose, Bld: 116 mg/dL — ABNORMAL HIGH (ref 70–99)
Potassium: 4.1 mmol/L (ref 3.5–5.1)
Sodium: 135 mmol/L (ref 135–145)

## 2019-08-07 LAB — CBC
HCT: 42.3 % (ref 36.0–46.0)
Hemoglobin: 13.1 g/dL (ref 12.0–15.0)
MCH: 26.8 pg (ref 26.0–34.0)
MCHC: 31 g/dL (ref 30.0–36.0)
MCV: 86.7 fL (ref 80.0–100.0)
Platelets: 326 10*3/uL (ref 150–400)
RBC: 4.88 MIL/uL (ref 3.87–5.11)
RDW: 16.7 % — ABNORMAL HIGH (ref 11.5–15.5)
WBC: 20.5 10*3/uL — ABNORMAL HIGH (ref 4.0–10.5)
nRBC: 0 % (ref 0.0–0.2)

## 2019-08-07 MED ORDER — PIPERACILLIN-TAZOBACTAM 3.375 G IVPB
3.3750 g | Freq: Three times a day (TID) | INTRAVENOUS | Status: DC
  Administered 2019-08-07 – 2019-08-23 (×49): 3.375 g via INTRAVENOUS
  Filled 2019-08-07 (×53): qty 50

## 2019-08-07 MED ORDER — PANTOPRAZOLE SODIUM 40 MG IV SOLR
40.0000 mg | INTRAVENOUS | Status: DC
  Administered 2019-08-07 – 2019-08-09 (×3): 40 mg via INTRAVENOUS
  Filled 2019-08-07 (×3): qty 40

## 2019-08-07 MED ORDER — SODIUM CHLORIDE (PF) 0.9 % IJ SOLN
INTRAMUSCULAR | Status: AC
  Filled 2019-08-07: qty 50

## 2019-08-07 MED ORDER — IOHEXOL 300 MG/ML  SOLN
100.0000 mL | Freq: Once | INTRAMUSCULAR | Status: AC | PRN
  Administered 2019-08-07: 100 mL via INTRAVENOUS

## 2019-08-07 MED ORDER — IOHEXOL 9 MG/ML PO SOLN
ORAL | Status: AC
  Filled 2019-08-07: qty 1000

## 2019-08-07 NOTE — Progress Notes (Signed)
3 Days Post-Op   Subjective/Chief Complaint: Still having abdominal pain  uop improving Having multiple loose bm's   Objective: Vital signs in last 24 hours: Temp:  [97.6 F (36.4 C)-98.4 F (36.9 C)] 97.6 F (36.4 C) (05/09 0545) Pulse Rate:  [116-129] 116 (05/09 0545) Resp:  [16-20] 16 (05/09 0545) BP: (109-126)/(70-82) 113/82 (05/09 0545) SpO2:  [92 %-98 %] 98 % (05/09 0545) Last BM Date: 08/07/19  Intake/Output from previous day: 05/08 0701 - 05/09 0700 In: 2014.5 [P.O.:180; I.V.:1834.5] Out: -  Intake/Output this shift: No intake/output data recorded.  Exam: Awake and alert Appears slightly uncomfortable Abdomen soft, non-distended, but mild diffuse tenderness  Lab Results:  Recent Labs    08/06/19 1521 08/07/19 0415  WBC 17.6* 20.5*  HGB 13.4 13.1  HCT 42.7 42.3  PLT 306 326   BMET Recent Labs    08/06/19 1521 08/07/19 0415  NA 134* 135  K 3.8 4.1  CL 101 102  CO2 26 23  GLUCOSE 123* 116*  BUN 12 14  CREATININE 0.80 0.80  CALCIUM 7.5* 7.3*   PT/INR No results for input(s): LABPROT, INR in the last 72 hours. ABG No results for input(s): PHART, HCO3 in the last 72 hours.  Invalid input(s): PCO2, PO2  Studies/Results: No results found.  Anti-infectives: Anti-infectives (From admission, onward)   Start     Dose/Rate Route Frequency Ordered Stop   08/04/19 2000  cefoTEtan (CEFOTAN) 2 g in sodium chloride 0.9 % 100 mL IVPB     2 g 200 mL/hr over 30 Minutes Intravenous Every 12 hours 08/04/19 1204 08/04/19 2107   08/04/19 0615  cefoTEtan (CEFOTAN) 2 g in sodium chloride 0.9 % 100 mL IVPB     2 g 200 mL/hr over 30 Minutes Intravenous On call to O.R. 08/04/19 0604 08/04/19 1639      Assessment/Plan: s/p Procedure(s): XI ROBOT ASSISTED SIGMOIDECTOMY (N/A) FIREFLY INJECTION (N/A)  Increasing WBC and tachycardia Will start antibiotics and check a CT scan of the abd/pelvis to look for anastomotic leak   LOS: 3 days    Coralie Keens 08/07/2019

## 2019-08-07 NOTE — Progress Notes (Signed)
Pt HR 117-120.  No change from HR for the past 8+ hours.  No distress noted.  Will continue to monitor VS closely Q4 hours.

## 2019-08-08 LAB — BASIC METABOLIC PANEL
Anion gap: 10 (ref 5–15)
BUN: 15 mg/dL (ref 6–20)
CO2: 22 mmol/L (ref 22–32)
Calcium: 7 mg/dL — ABNORMAL LOW (ref 8.9–10.3)
Chloride: 104 mmol/L (ref 98–111)
Creatinine, Ser: 0.74 mg/dL (ref 0.44–1.00)
GFR calc Af Amer: >60 mL/min (ref >60)
GFR calc non Af Amer: >60 mL/min (ref >60)
Glucose, Bld: 96 mg/dL (ref 70–99)
Potassium: 4 mmol/L (ref 3.5–5.1)
Sodium: 136 mmol/L (ref 135–145)

## 2019-08-08 LAB — CBC WITH DIFFERENTIAL/PLATELET
Abs Immature Granulocytes: 0.17 10*3/uL — ABNORMAL HIGH (ref 0.00–0.07)
Basophils Absolute: 0.1 10*3/uL (ref 0.0–0.1)
Basophils Relative: 0 %
Eosinophils Absolute: 0.1 10*3/uL (ref 0.0–0.5)
Eosinophils Relative: 1 %
HCT: 37.1 % (ref 36.0–46.0)
Hemoglobin: 11.9 g/dL — ABNORMAL LOW (ref 12.0–15.0)
Immature Granulocytes: 1 %
Lymphocytes Relative: 3 %
Lymphs Abs: 0.5 10*3/uL — ABNORMAL LOW (ref 0.7–4.0)
MCH: 27.6 pg (ref 26.0–34.0)
MCHC: 32.1 g/dL (ref 30.0–36.0)
MCV: 86.1 fL (ref 80.0–100.0)
Monocytes Absolute: 1.1 10*3/uL — ABNORMAL HIGH (ref 0.1–1.0)
Monocytes Relative: 6 %
Neutro Abs: 14.9 10*3/uL — ABNORMAL HIGH (ref 1.7–7.7)
Neutrophils Relative %: 89 %
Platelets: 314 10*3/uL (ref 150–400)
RBC: 4.31 MIL/uL (ref 3.87–5.11)
RDW: 16.7 % — ABNORMAL HIGH (ref 11.5–15.5)
WBC: 16.8 10*3/uL — ABNORMAL HIGH (ref 4.0–10.5)
nRBC: 0 % (ref 0.0–0.2)

## 2019-08-08 LAB — CBC
HCT: 38.2 % (ref 36.0–46.0)
Hemoglobin: 11.9 g/dL — ABNORMAL LOW (ref 12.0–15.0)
MCH: 26.9 pg (ref 26.0–34.0)
MCHC: 31.2 g/dL (ref 30.0–36.0)
MCV: 86.4 fL (ref 80.0–100.0)
Platelets: 326 10*3/uL (ref 150–400)
RBC: 4.42 MIL/uL (ref 3.87–5.11)
RDW: 16.8 % — ABNORMAL HIGH (ref 11.5–15.5)
WBC: 17.2 10*3/uL — ABNORMAL HIGH (ref 4.0–10.5)
nRBC: 0 % (ref 0.0–0.2)

## 2019-08-08 MED ORDER — HYDROMORPHONE HCL 1 MG/ML IJ SOLN
0.5000 mg | INTRAMUSCULAR | Status: DC | PRN
  Administered 2019-08-08 – 2019-08-09 (×2): 0.5 mg via INTRAVENOUS
  Administered 2019-08-09: 1 mg via INTRAVENOUS
  Administered 2019-08-09: 1.5 mg via INTRAVENOUS
  Administered 2019-08-09: 0.5 mg via INTRAVENOUS
  Administered 2019-08-09: 1.5 mg via INTRAVENOUS
  Administered 2019-08-10: 0.5 mg via INTRAVENOUS
  Administered 2019-08-11 – 2019-08-12 (×6): 1 mg via INTRAVENOUS
  Administered 2019-08-12: 0.5 mg via INTRAVENOUS
  Administered 2019-08-12: 1 mg via INTRAVENOUS
  Administered 2019-08-13: 0.5 mg via INTRAVENOUS
  Administered 2019-08-13: 1 mg via INTRAVENOUS
  Administered 2019-08-13: 1.5 mg via INTRAVENOUS
  Administered 2019-08-13: 1 mg via INTRAVENOUS
  Administered 2019-08-13: 1.5 mg via INTRAVENOUS
  Administered 2019-08-14 (×6): 1 mg via INTRAVENOUS
  Administered 2019-08-15: 1.5 mg via INTRAVENOUS
  Administered 2019-08-15: 1 mg via INTRAVENOUS
  Administered 2019-08-15: 1.5 mg via INTRAVENOUS
  Administered 2019-08-15: 0.5 mg via INTRAVENOUS
  Administered 2019-08-15: 1 mg via INTRAVENOUS
  Administered 2019-08-15 – 2019-08-16 (×2): 0.5 mg via INTRAVENOUS
  Administered 2019-08-16: 1.5 mg via INTRAVENOUS
  Administered 2019-08-16 – 2019-08-17 (×7): 1 mg via INTRAVENOUS
  Administered 2019-08-17: 1.5 mg via INTRAVENOUS
  Administered 2019-08-17 (×2): 1 mg via INTRAVENOUS
  Administered 2019-08-17: 1.5 mg via INTRAVENOUS
  Administered 2019-08-17: 1 mg via INTRAVENOUS
  Administered 2019-08-17 – 2019-08-19 (×10): 1.5 mg via INTRAVENOUS
  Administered 2019-08-19: 1 mg via INTRAVENOUS
  Administered 2019-08-19 (×2): 1.5 mg via INTRAVENOUS
  Administered 2019-08-20: 1 mg via INTRAVENOUS
  Administered 2019-08-20: 1.5 mg via INTRAVENOUS
  Administered 2019-08-20: 1 mg via INTRAVENOUS
  Administered 2019-08-20 – 2019-08-21 (×5): 1.5 mg via INTRAVENOUS
  Administered 2019-08-21: 1 mg via INTRAVENOUS
  Administered 2019-08-21: 1.5 mg via INTRAVENOUS
  Administered 2019-08-21 – 2019-08-24 (×12): 1 mg via INTRAVENOUS
  Filled 2019-08-08 (×2): qty 1
  Filled 2019-08-08 (×2): qty 1.5
  Filled 2019-08-08: qty 1
  Filled 2019-08-08: qty 0.5
  Filled 2019-08-08 (×2): qty 1
  Filled 2019-08-08: qty 1.5
  Filled 2019-08-08: qty 0.5
  Filled 2019-08-08 (×3): qty 1
  Filled 2019-08-08: qty 0.5
  Filled 2019-08-08: qty 1.5
  Filled 2019-08-08 (×2): qty 1
  Filled 2019-08-08: qty 1.5
  Filled 2019-08-08 (×2): qty 1
  Filled 2019-08-08: qty 1.5
  Filled 2019-08-08: qty 1
  Filled 2019-08-08: qty 1.5
  Filled 2019-08-08: qty 0.5
  Filled 2019-08-08: qty 1
  Filled 2019-08-08: qty 1.5
  Filled 2019-08-08: qty 0.5
  Filled 2019-08-08 (×3): qty 1
  Filled 2019-08-08: qty 1.5
  Filled 2019-08-08 (×2): qty 0.5
  Filled 2019-08-08 (×3): qty 1
  Filled 2019-08-08: qty 0.5
  Filled 2019-08-08: qty 1
  Filled 2019-08-08: qty 1.5
  Filled 2019-08-08: qty 1
  Filled 2019-08-08 (×4): qty 1.5
  Filled 2019-08-08 (×3): qty 1
  Filled 2019-08-08: qty 1.5
  Filled 2019-08-08 (×5): qty 1
  Filled 2019-08-08 (×2): qty 1.5
  Filled 2019-08-08 (×2): qty 1
  Filled 2019-08-08: qty 1.5
  Filled 2019-08-08: qty 0.5
  Filled 2019-08-08: qty 1
  Filled 2019-08-08: qty 1.5
  Filled 2019-08-08: qty 1
  Filled 2019-08-08: qty 1.5
  Filled 2019-08-08 (×3): qty 1
  Filled 2019-08-08 (×2): qty 1.5
  Filled 2019-08-08 (×2): qty 1
  Filled 2019-08-08 (×2): qty 1.5
  Filled 2019-08-08 (×2): qty 1
  Filled 2019-08-08: qty 1.5
  Filled 2019-08-08 (×2): qty 1
  Filled 2019-08-08: qty 0.5
  Filled 2019-08-08 (×2): qty 1.5
  Filled 2019-08-08: qty 1
  Filled 2019-08-08: qty 1.5

## 2019-08-08 MED ORDER — GABAPENTIN 300 MG PO CAPS
300.0000 mg | ORAL_CAPSULE | Freq: Two times a day (BID) | ORAL | Status: DC
  Administered 2019-08-08 – 2019-08-20 (×21): 300 mg via ORAL
  Filled 2019-08-08 (×24): qty 1

## 2019-08-08 MED ORDER — ACETAMINOPHEN 500 MG PO TABS
1000.0000 mg | ORAL_TABLET | Freq: Four times a day (QID) | ORAL | Status: AC
  Administered 2019-08-08 – 2019-08-10 (×7): 1000 mg via ORAL
  Filled 2019-08-08 (×8): qty 2

## 2019-08-08 MED ORDER — HYDROMORPHONE HCL 1 MG/ML IJ SOLN
0.5000 mg | INTRAMUSCULAR | Status: DC | PRN
  Administered 2019-08-08 (×2): 1 mg via INTRAVENOUS
  Filled 2019-08-08 (×2): qty 1

## 2019-08-08 NOTE — Progress Notes (Signed)
PATIENT DOCTORS AWARE OF ONGOING TACHYCARDIA. PATIENT ON CONTINUOUS PULSE OX.

## 2019-08-08 NOTE — Progress Notes (Addendum)
Sandra Bonilla [id number] October 06, 1975  CARE TEAM:  PCP: [name], MD  Outpatient Care Team: Patient Care Team: [name], MD as PCP - General (Internal Medicine) [name], MD as Consulting Physician (Colon and Rectal Surgery)  Inpatient Treatment Team: Treatment Team: Attending Provider: [name], MD; Technician: Linda Hedges, NT; Registered Nurse: Yvette Rack, RN; Registered Nurse: Nelida Meuse, RN; Consulting Physician: Edison Pace, Md, MD; Consulting Physician: Ileana Roup, MD; Utilization Review: Sindy Guadeloupe, RN; Social Worker: Lia Hopping, LCSW   Problem List:   Active Problems:   Diverticular disease   4 Days Post-Op  08/04/2019  Procedure(s): XI ROBOT ASSISTED SIGMOIDECTOMY FIREFLY INJECTION    Assessment  POD4 s/p robotic assisted sigmoidectomy Diverticular disease  [facility] Stay = 4 days)  Plan: CT without obvious evidence of anastomotic leak. Some free air and indescrete fluid collection with possible abscess in RLQ/LLQ.   -Started on IV Zosyn, continue  -Optimize pain control  -Optimize nausea control  -Slowly advance diet to clears   -VTE prophylaxis- SCDs, etc  -mobilize as tolerated to help recovery  30 minutes spent in review, evaluation, examination, counseling, and coordination of care.  More than 50% of that time was spent in counseling.  D/C patient from hospital when patient meets criteria (anticipate in 1-3 day(s)):  Tolerating oral intake well Ambulating well Adequate pain control without IV medications Urinating  Having flatus Disposition planning in place   08/08/2019   Subjective:  Patient pain improved today. 1 episode of nausea without vomiting this morning.  Having multiple bowel movements, no flatus.  Objective:  Vital signs:  Vitals:   08/07/19 2148 08/08/19 0516 08/08/19 0717 08/08/19 0745  BP: 130/82 127/78 123/75   Pulse: (!) 105 (!) 115  (!) 113   Resp: 15 16 18    Temp: 97.7 F (36.5 C) 98.7 F (37.1 C) 98.5 F (36.9 C)   TempSrc: Oral Oral Oral   SpO2: 95% 94% 93%   Weight:    96.8 kg  Height:        Last BM Date: 08/07/19  Intake/Output   Yesterday:  05/09 0701 - 05/10 0700 In: 1109.3 [I.V.:958.7; IV Piggyback:150.5] Out: 0  This shift:  No intake/output data recorded.  Bowel function:  Flatus: YES  BM:  No  Drain: (No drain)   Physical Exam:  General: Pt awake/alert in no acute distress Eyes: PERRL, normal EOM.  Sclera clear.  No icterus Neuro: CN II-XII intact w/o focal sensory/motor deficits. Lymph: No head/neck/groin lymphadenopathy Psych:  No delerium/psychosis/paranoia.  Oriented x 4 HENT: Normocephalic, Mucus membranes moist.  No thrush Neck: Supple, No tracheal deviation.  No obvious thyromegaly Chest: No pain to chest wall compression.  Good respiratory excursion.  No audible wheezing CV:  Pulses intact.  Regular rhythm.  No major extremity edema MS: Normal AROM mjr joints.  No obvious deformity  Abdomen: Somewhat firm.  Mildy distended.   Mild diffuse tenderness, worse over incision sites .  No evidence of peritonitis.  No incarcerated hernias.  Ext:  Incisions clean/dry/intact. Dermabond over port sites. Honeycomb dressing remains in place over lower incision.  No deformity.  No mjr edema.  No cyanosis Skin: No petechiae / purpurea.  No major sores.  Warm and dry    Results:   Cultures: Recent Results (from the past 720 hour(s))  SARS CORONAVIRUS 2 (TAT 6-24 HRS) Nasopharyngeal Nasopharyngeal Swab     Status: None   Collection Time: 08/01/19  2:54 PM   Specimen: Nasopharyngeal Swab  Result Value Ref Range Status   SARS Coronavirus 2 NEGATIVE NEGATIVE Final    Comment: (NOTE) SARS-CoV-2 target nucleic acids are NOT DETECTED. The SARS-CoV-2 RNA is generally detectable in upper and lower respiratory specimens during the acute phase of infection. Negative results do not preclude  SARS-CoV-2 infection, do not rule out co-infections with other pathogens, and should not be used as the sole basis for treatment or other patient management decisions. Negative results must be combined with clinical observations, patient history, and epidemiological information. The expected result is Negative. Fact Sheet for Patients: SugarRoll.be Fact Sheet for Healthcare Providers: https://www.woods-mathews.com/ This test is not yet approved or cleared by the [place] FDA and  has been authorized for detection and/or diagnosis of SARS-CoV-2 by FDA under an Emergency Use Authorization (EUA). This EUA will remain  in effect (meaning this test can be used) for the duration of the COVID-19 declaration under Section 56 4(b)(1) of the Act, 21 U.S.C. section 360bbb-3(b)(1), unless the authorization is terminated or revoked sooner. Performed at [facility], [place] [address], [place], [place] [address]     Labs: Results for orders placed or performed during the hospital encounter of 08/04/19 (from the past 48 hour(s))  CBC     Status: Abnormal   Collection Time: 08/06/19  3:21 PM  Result Value Ref Range   WBC 17.6 (H) 4.0 - 10.5 K/uL   RBC 4.91 3.87 - 5.11 MIL/uL   Hemoglobin 13.4 12.0 - 15.0 g/dL   HCT 42.7 36.0 - 46.0 %   MCV 87.0 80.0 - 100.0 fL   MCH 27.3 26.0 - 34.0 pg   MCHC 31.4 30.0 - 36.0 g/dL   RDW 16.8 (H) 11.5 - 15.5 %   Platelets 306 150 - 400 K/uL   nRBC 0.0 0.0 - 0.2 %    Comment: Performed at [facility], [place] [address], [place], [place] [id number]  Basic metabolic panel     Status: Abnormal   Collection Time: 08/06/19  3:21 PM  Result Value Ref Range   Sodium 134 (L) 135 - 145 mmol/L   Potassium 3.8 3.5 - 5.1 mmol/L   Chloride 101 98 - 111 mmol/L   CO2 26 22 - 32 mmol/L   Glucose, Bld 123 (H) 70 - 99 mg/dL    Comment: Glucose reference range applies only to samples taken after fasting for at  least 8 hours.   BUN 12 6 - 20 mg/dL   Creatinine, Ser 0.80 0.44 - 1.00 mg/dL   Calcium 7.5 (L) 8.9 - 10.3 mg/dL   GFR calc non Af Amer >60 >60 mL/min   GFR calc Af Amer >60 >60 mL/min   Anion gap 7 5 - 15    Comment: Performed at [facility], [place] [address], [place], [place] [address]  CBC     Status: Abnormal   Collection Time: 08/07/19  4:15 AM  Result Value Ref Range   WBC 20.5 (H) 4.0 - 10.5 K/uL   RBC 4.88 3.87 - 5.11 MIL/uL   Hemoglobin 13.1 12.0 - 15.0 g/dL   HCT 42.3 36.0 - 46.0 %   MCV 86.7 80.0 - 100.0 fL   MCH 26.8 26.0 - 34.0 pg   MCHC 31.0 30.0 - 36.0 g/dL   RDW 16.7 (H) 11.5 - 15.5 %   Platelets 326 150 - 400 K/uL   nRBC 0.0 0.0 - 0.2 %  Comment: Performed at [facility], [place] [address], [place], [place] [id number]  Basic metabolic panel     Status: Abnormal   Collection Time: 08/07/19  4:15 AM  Result Value Ref Range   Sodium 135 135 - 145 mmol/L   Potassium 4.1 3.5 - 5.1 mmol/L   Chloride 102 98 - 111 mmol/L   CO2 23 22 - 32 mmol/L   Glucose, Bld 116 (H) 70 - 99 mg/dL    Comment: Glucose reference range applies only to samples taken after fasting for at least 8 hours.   BUN 14 6 - 20 mg/dL   Creatinine, Ser 0.80 0.44 - 1.00 mg/dL   Calcium 7.3 (L) 8.9 - 10.3 mg/dL   GFR calc non Af Amer >60 >60 mL/min   GFR calc Af Amer >60 >60 mL/min   Anion gap 10 5 - 15    Comment: Performed at [facility], [place] [address] [place], [place] [address]  CBC with Differential/Platelet     Status: Abnormal   Collection Time: 08/08/19  5:10 AM  Result Value Ref Range   WBC 16.8 (H) 4.0 - 10.5 K/uL   RBC 4.31 3.87 - 5.11 MIL/uL   Hemoglobin 11.9 (L) 12.0 - 15.0 g/dL   HCT 37.1 36.0 - 46.0 %   MCV 86.1 80.0 - 100.0 fL   MCH 27.6 26.0 - 34.0 pg   MCHC 32.1 30.0 - 36.0 g/dL   RDW 16.7 (H) 11.5 - 15.5 %   Platelets 314 150 - 400 K/uL   nRBC 0.0 0.0 - 0.2 %   Neutrophils Relative % 89 %   Neutro Abs 14.9 (H) 1.7 -  7.7 K/uL   Lymphocytes Relative 3 %   Lymphs Abs 0.5 (L) 0.7 - 4.0 K/uL   Monocytes Relative 6 %   Monocytes Absolute 1.1 (H) 0.1 - 1.0 K/uL   Eosinophils Relative 1 %   Eosinophils Absolute 0.1 0.0 - 0.5 K/uL   Basophils Relative 0 %   Basophils Absolute 0.1 0.0 - 0.1 K/uL   Immature Granulocytes 1 %   Abs Immature Granulocytes 0.17 (H) 0.00 - 0.07 K/uL    Comment: Performed at [facility], [place] [address], [place], [place] [id number]  Basic metabolic panel     Status: Abnormal   Collection Time: 08/08/19  5:10 AM  Result Value Ref Range   Sodium 136 135 - 145 mmol/L   Potassium 4.0 3.5 - 5.1 mmol/L   Chloride 104 98 - 111 mmol/L   CO2 22 22 - 32 mmol/L   Glucose, Bld 96 70 - 99 mg/dL    Comment: Glucose reference range applies only to samples taken after fasting for at least 8 hours.   BUN 15 6 - 20 mg/dL   Creatinine, Ser 0.74 0.44 - 1.00 mg/dL   Calcium 7.0 (L) 8.9 - 10.3 mg/dL   GFR calc non Af Amer >60 >60 mL/min   GFR calc Af Amer >60 >60 mL/min   Anion gap 10 5 - 15    Comment: Performed at [facility], [place] [address] [place], [place] [address]    Imaging / Studies: CT ABDOMEN PELVIS W CONTRAST  Result Date: 08/07/2019 CLINICAL DATA:  Abdominal pain, fever. History of sigmoidectomy 3 days ago. EXAM: CT ABDOMEN AND PELVIS WITH CONTRAST TECHNIQUE: Multidetector CT imaging of the abdomen and pelvis was performed using the standard protocol following bolus administration of intravenous contrast. CONTRAST:  123mL OMNIPAQUE  IOHEXOL 300 MG/ML  SOLN COMPARISON:  CT abdomen pelvis dated 03/07/2019. FINDINGS: Lower chest: There is moderate bibasilar atelectasis. Hepatobiliary: No focal liver abnormality is seen. Status post cholecystectomy. No biliary dilatation. Pancreas: Unremarkable. No pancreatic ductal dilatation or surrounding inflammatory changes. Spleen: Normal in size without focal abnormality. Adrenals/Urinary Tract: A benign left adrenal  adenoma is unchanged. Kidneys are normal, without renal calculi, focal lesion, or hydronephrosis. Bladder is decompressed. Stomach/Bowel: The patient is status post sigmoidectomy with colorectal anastomosis. There is moderate volume of free intraperitoneal fluid and free intraperitoneal air which is likely postoperative in nature. There is no evidence of bowel obstruction. Multiple more discrete areas of intraperitoneal fluid may represent developing abscesses. A fluid collection in the right lower quadrant measures 5.7 x 3.2 x 5.0 cm (series 2, image 78 and series 5, image 46). A fluid collection in the left lower quadrant measures 5.0 x 2.4 x 6.5 cm (series 2, image 82 and series 5, image 79). No fluid collection is seen adjacent to the anastomosis, however sensitivity to detect an anastomotic leak is limited given the lack of enteric contrast in this portion of the bowel. Vascular/Lymphatic: No significant vascular findings are present. No enlarged abdominal or pelvic lymph nodes. Reproductive: Bilateral adnexal cysts measure up to 3.0 cm. The uterus is unremarkable. Other: No abdominal wall hernia or abnormality. Musculoskeletal: No acute or significant osseous findings. Degenerative changes are most significant at L3-4. IMPRESSION: 1. Moderate volume of free intraperitoneal fluid and free intraperitoneal air are likely postoperative in nature, however more discrete areas of intraperitoneal fluid may represent developing abscesses in the right lower and left lower quadrants. 2. No fluid collection is seen immediately adjacent to the anastomosis, however sensitivity to detect an anastomotic leak is limited given the lack of enteric contrast in this portion of the bowel. Electronically Signed   By: Zerita Boers M.D.   On: 08/07/2019 11:45    Medications / Allergies: per chart  Antibiotics: Anti-infectives (From admission, onward)    Start     Dose/Rate Route Frequency Ordered Stop   08/07/19 0830   piperacillin-tazobactam (ZOSYN) IVPB 3.375 g     3.375 g 12.5 mL/hr over 240 Minutes Intravenous Every 8 hours 08/07/19 0803     08/04/19 2000  cefoTEtan (CEFOTAN) 2 g in sodium chloride 0.9 % 100 mL IVPB     2 g 200 mL/hr over 30 Minutes Intravenous Every 12 hours 08/04/19 1204 08/04/19 2107   08/04/19 0615  cefoTEtan (CEFOTAN) 2 g in sodium chloride 0.9 % 100 mL IVPB     2 g 200 mL/hr over 30 Minutes Intravenous On call to O.R. 08/04/19 0604 08/04/19 1639         Note: Portions of this report may have been transcribed using voice recognition software. Every effort was made to ensure accuracy; however, inadvertent computerized transcription errors may be present.   Any transcriptional errors that result from this process are unintentional.    Orbie Pyo, PA-S Patient was seen and evaluated in coordination with Rennis Chris, MD   [id number]  8:57 AM

## 2019-08-08 NOTE — Progress Notes (Signed)
DR [name] AND [facility] PROVIDERS AWARE OF PATIENT CONTINUOUS TACHYCARDIA. PATIENT GIVEN INCENTIVE SPIROMETER- EDUCATED AND ENCOURAGED TO USE IT AT LEAST 10 TIMES EVERY HOUR.

## 2019-08-08 NOTE — Progress Notes (Signed)
Pt c/o 10/10 sharp pain to RUQ of abdomen radiating to her back that makes it difficult to take a deep breath and is also unrelieved by last dose of Dilaudid 1mg  IV. Also pt reports that when she went to the bathroom it was just bright red blood in the toilet. HR still running in the 120's. Notified Dr. Lucia Gaskins of [place]. New orders to increase frequency of the Dilaudid to Q2hrs PRN and add a CBC with her morning labs. New orders implemented and will continue to monitor pt.

## 2019-08-08 NOTE — Progress Notes (Signed)
DR GROSS PAGED REGARDING PATIENT PULSE STILL TACHY 120 BPM- PAIN ON THE RIGHT SIDE THAT IS RELIEVED BY DILAUDID, AND TEMP 99.8. INCENTIVE SPIROMETER ENCOURAGED AND EDUCATED THROUGHOUT THIS SHIFT. CALL BELL WITHIN REACH.

## 2019-08-08 NOTE — Progress Notes (Signed)
   08/08/19 0516  Assess: MEWS Score  Temp 98.7 F (37.1 C)  BP 127/78  Pulse Rate (!) 115  Resp 16  SpO2 94 %  O2 Device Room Air  Assess: MEWS Score  MEWS Temp 0  MEWS Systolic 0  MEWS Pulse 2  MEWS RR 0  MEWS LOC 0  MEWS Score 2  MEWS Score Color Yellow  Assess: if the MEWS score is Yellow or Red  Were vital signs taken at a resting state? Yes  Focused Assessment Documented focused assessment  Early Detection of Sepsis Score *See Row Information* Low  MEWS guidelines implemented *See Row Information* No, previously yellow, continue vital signs every 4 hours  Treat  MEWS Interventions Administered scheduled meds/treatments;Administered prn meds/treatments  Take Vital Signs  Increase Vital Sign Frequency  Yellow: Q 2hr X 2 then Q 4hr X 2, if remains yellow, continue Q 4hrs   MD aware of patients ongoing tachycardia. Will recheck VS in 2 hrs. Pt resting in bed, not in any distress at this time. Will continue to monitor.

## 2019-08-08 NOTE — Plan of Care (Signed)

## 2019-08-08 NOTE — Progress Notes (Signed)
Pt c/o rt upper back pain, sharp and constant 10+/10 unrelieved by two doses of 1mg  Dilaudid IV 2hrs apart. Notified Dr. Alphonsa Overall of [place]. New orders to reorder 1000mg  tylenol Q6hrs x 48hrs, gabapentin 300mg  BID and can increase Dilaudid to 1.5mg  Q2hrs PRN severe pain. New orders implemented and will continue to monitor pts pain level.

## 2019-08-09 LAB — CBC
HCT: 37.6 % (ref 36.0–46.0)
Hemoglobin: 11.8 g/dL — ABNORMAL LOW (ref 12.0–15.0)
MCH: 26.8 pg (ref 26.0–34.0)
MCHC: 31.4 g/dL (ref 30.0–36.0)
MCV: 85.3 fL (ref 80.0–100.0)
Platelets: 350 10*3/uL (ref 150–400)
RBC: 4.41 MIL/uL (ref 3.87–5.11)
RDW: 16.9 % — ABNORMAL HIGH (ref 11.5–15.5)
WBC: 15.5 10*3/uL — ABNORMAL HIGH (ref 4.0–10.5)
nRBC: 0 % (ref 0.0–0.2)

## 2019-08-09 LAB — BASIC METABOLIC PANEL
Anion gap: 10 (ref 5–15)
BUN: 13 mg/dL (ref 6–20)
CO2: 22 mmol/L (ref 22–32)
Calcium: 7.6 mg/dL — ABNORMAL LOW (ref 8.9–10.3)
Chloride: 102 mmol/L (ref 98–111)
Creatinine, Ser: 0.8 mg/dL (ref 0.44–1.00)
GFR calc Af Amer: >60 mL/min (ref >60)
GFR calc non Af Amer: >60 mL/min (ref >60)
Glucose, Bld: 124 mg/dL — ABNORMAL HIGH (ref 70–99)
Potassium: 3.5 mmol/L (ref 3.5–5.1)
Sodium: 134 mmol/L — ABNORMAL LOW (ref 135–145)

## 2019-08-09 MED ORDER — SIMETHICONE 80 MG PO CHEW: 160.0000 mg | CHEWABLE_TABLET | Freq: Four times a day (QID) | ORAL | Status: DC | PRN

## 2019-08-09 MED ORDER — SIMETHICONE 80 MG PO CHEW
160.0000 mg | CHEWABLE_TABLET | Freq: Three times a day (TID) | ORAL | Status: AC | PRN
  Administered 2019-08-10: 160 mg via ORAL
  Filled 2019-08-09: qty 2

## 2019-08-09 MED ORDER — ALUM & MAG HYDROXIDE-SIMETH 200-200-20 MG/5ML PO SUSP: 30.0000 mL | Freq: Four times a day (QID) | ORAL | Status: AC | PRN

## 2019-08-09 NOTE — Progress Notes (Signed)
5 Days Post-Op robotic sigmoidectomy Subjective: Had a lot of sharp pain last night.  Tolerating clears.  No nausea.  No flatus or BM recently per pt Objective: Vital signs in last 24 hours: Temp:  [97.5 F (36.4 C)-100.3 F (37.9 C)] 97.8 F (36.6 C) (05/11 0631) Pulse Rate:  [95-124] 105 (05/11 0631) Resp:  [17-20] 18 (05/11 0631) BP: (91-131)/(54-81) 125/75 (05/11 0631) SpO2:  [95 %-99 %] 99 % (05/11 0631) Weight:  [99.4 kg] 99.4 kg (05/11 0500)   Intake/Output from previous day: 05/10 0701 - 05/11 0700 In: 3153.1 [P.O.:620; I.V.:2370.5; IV Piggyback:162.6] Out: -  Intake/Output this shift: No intake/output data recorded.   General appearance: alert and cooperative GI: normal findings: soft, non-distended  Incision: no significant drainage  Lab Results:  Recent Labs    08/08/19 0950 08/09/19 0235  WBC 17.2* 15.5*  HGB 11.9* 11.8*  HCT 38.2 37.6  PLT 326 350   BMET Recent Labs    08/08/19 0510 08/09/19 0235  NA 136 134*  K 4.0 3.5  CL 104 102  CO2 22 22  GLUCOSE 96 124*  BUN 15 13  CREATININE 0.74 0.80  CALCIUM 7.0* 7.6*   PT/INR No results for input(s): LABPROT, INR in the last 72 hours. ABG No results for input(s): PHART, HCO3 in the last 72 hours.  Invalid input(s): PCO2, PO2  MEDS, Scheduled . acetaminophen  1,000 mg Oral Q6H  . enoxaparin (LOVENOX) injection  40 mg Subcutaneous Q24H  . fluticasone furoate-vilanterol  1 puff Inhalation Daily   And  . umeclidinium bromide  1 puff Inhalation Daily  . gabapentin  300 mg Oral BID  . pantoprazole (PROTONIX) IV  40 mg Intravenous Q24H    Studies/Results: CT ABDOMEN PELVIS W CONTRAST  Result Date: 08/07/2019 CLINICAL DATA:  Abdominal pain, fever. History of sigmoidectomy 3 days ago. EXAM: CT ABDOMEN AND PELVIS WITH CONTRAST TECHNIQUE: Multidetector CT imaging of the abdomen and pelvis was performed using the standard protocol following bolus administration of intravenous contrast. CONTRAST:   114mL OMNIPAQUE IOHEXOL 300 MG/ML  SOLN COMPARISON:  CT abdomen pelvis dated 03/07/2019. FINDINGS: Lower chest: There is moderate bibasilar atelectasis. Hepatobiliary: No focal liver abnormality is seen. Status post cholecystectomy. No biliary dilatation. Pancreas: Unremarkable. No pancreatic ductal dilatation or surrounding inflammatory changes. Spleen: Normal in size without focal abnormality. Adrenals/Urinary Tract: A benign left adrenal adenoma is unchanged. Kidneys are normal, without renal calculi, focal lesion, or hydronephrosis. Bladder is decompressed. Stomach/Bowel: The patient is status post sigmoidectomy with colorectal anastomosis. There is moderate volume of free intraperitoneal fluid and free intraperitoneal air which is likely postoperative in nature. There is no evidence of bowel obstruction. Multiple more discrete areas of intraperitoneal fluid may represent developing abscesses. A fluid collection in the right lower quadrant measures 5.7 x 3.2 x 5.0 cm (series 2, image 78 and series 5, image 46). A fluid collection in the left lower quadrant measures 5.0 x 2.4 x 6.5 cm (series 2, image 82 and series 5, image 79). No fluid collection is seen adjacent to the anastomosis, however sensitivity to detect an anastomotic leak is limited given the lack of enteric contrast in this portion of the bowel. Vascular/Lymphatic: No significant vascular findings are present. No enlarged abdominal or pelvic lymph nodes. Reproductive: Bilateral adnexal cysts measure up to 3.0 cm. The uterus is unremarkable. Other: No abdominal wall hernia or abnormality. Musculoskeletal: No acute or significant osseous findings. Degenerative changes are most significant at L3-4. IMPRESSION: 1. Moderate volume of free  intraperitoneal fluid and free intraperitoneal air are likely postoperative in nature, however more discrete areas of intraperitoneal fluid may represent developing abscesses in the right lower and left lower quadrants.  2. No fluid collection is seen immediately adjacent to the anastomosis, however sensitivity to detect an anastomotic leak is limited given the lack of enteric contrast in this portion of the bowel. Electronically Signed   By: Zerita Boers M.D.   On: 08/07/2019 11:45    Assessment: s/p Procedure(s): XI ROBOT ASSISTED SIGMOIDECTOMY FIREFLY INJECTION Patient Active Problem List   Diagnosis Date Noted  . Diverticular disease 08/04/2019  . 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 Absolute anemia 06/25/2019  . Change in bowel habits 06/25/2019  . IUD threads lost 01/26/2019  . History of abnormal cervical Pap smear 01/25/2019  . Hx of diverticulitis of colon 12/24/2018  . Dyspepsia 12/24/2018  . Non-intractable vomiting 12/24/2018  . Abnormal CT scan, pelvis 12/24/2018  . Urinary dysfunction 12/30/2017  . Varicose veins of bilateral lower extremities with pain 10/28/2017  . Low grade squamous intraepithelial lesion (LGSIL) on cervical Pap smear 10/29/2016  . 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 Head injury 06/25/2016  . Essential hypertension 04/22/2016  . Diverticulitis of large intestine with complication   . Multiple sclerosis ([place])   . Diverticulitis 04/21/2016  . Other headache syndrome 02/05/2016  . Vitamin D deficiency 10/09/2015  . Other fatigue 10/09/2015  . Dysesthesia 04/11/2015  . Depression with anxiety 04/11/2015  . Gait disturbance 12/13/2014  . Insomnia 12/13/2014  . Multiple sclerosis, relapsing-remitting ([place]) 08/30/2012  . Relapsing remitting multiple sclerosis ([place]) 08/30/2012    Post op ileus  Plan: Wbc trending down Encourage pt to ambulate and use IS regularly  Recheck labs in AM Cont clears Cont abx unclear if there is an infectious etiology or not   LOS: 5 days     .Rosario Adie, MD [facility], [place]    08/09/2019 8:10 AM

## 2019-08-09 NOTE — Anesthesia Postprocedure Evaluation (Signed)
Anesthesia Post Note  Patient: Sandra Bonilla  Procedure(s) Performed: XI ROBOT ASSISTED SIGMOIDECTOMY (N/A Abdomen) FIREFLY INJECTION (N/A )     Patient location during evaluation: PACU Anesthesia Type: General Level of consciousness: sedated Pain management: pain level controlled Vital Signs Assessment: post-procedure vital signs reviewed and stable Respiratory status: spontaneous breathing Cardiovascular status: stable Postop Assessment: no apparent nausea or vomiting Anesthetic complications: no    Last Vitals:  Vitals:   08/09/19 0618 08/09/19 0631  BP: (!) 91/54 125/75  Pulse: (!) 103 (!) 105  Resp: 20 18  Temp: (!) 36.4 C 36.6 C  SpO2: 98% 99%    Last Pain:  Vitals:   08/09/19 0631  TempSrc: Oral  PainSc: 8    Pain Goal: Patients Stated Pain Goal: 5 (08/09/19 0631)                 Huston Foley

## 2019-08-10 ENCOUNTER — Inpatient Hospital Stay (HOSPITAL_COMMUNITY): Payer: BC Managed Care – PPO

## 2019-08-10 DIAGNOSIS — R609 Edema, unspecified: Secondary | ICD-10-CM

## 2019-08-10 DIAGNOSIS — R0602 Shortness of breath: Secondary | ICD-10-CM

## 2019-08-10 LAB — BASIC METABOLIC PANEL
Anion gap: 10 (ref 5–15)
BUN: 10 mg/dL (ref 6–20)
CO2: 22 mmol/L (ref 22–32)
Calcium: 7.1 mg/dL — ABNORMAL LOW (ref 8.9–10.3)
Chloride: 107 mmol/L (ref 98–111)
Creatinine, Ser: 0.56 mg/dL (ref 0.44–1.00)
GFR calc Af Amer: >60 mL/min (ref >60)
GFR calc non Af Amer: >60 mL/min (ref >60)
Glucose, Bld: 77 mg/dL (ref 70–99)
Potassium: 3.5 mmol/L (ref 3.5–5.1)
Sodium: 139 mmol/L (ref 135–145)

## 2019-08-10 LAB — CBC
HCT: 32.1 % — ABNORMAL LOW (ref 36.0–46.0)
Hemoglobin: 10.2 g/dL — ABNORMAL LOW (ref 12.0–15.0)
MCH: 26.8 pg (ref 26.0–34.0)
MCHC: 31.8 g/dL (ref 30.0–36.0)
MCV: 84.5 fL (ref 80.0–100.0)
Platelets: 296 10*3/uL (ref 150–400)
RBC: 3.8 MIL/uL — ABNORMAL LOW (ref 3.87–5.11)
RDW: 16.8 % — ABNORMAL HIGH (ref 11.5–15.5)
WBC: 14.6 10*3/uL — ABNORMAL HIGH (ref 4.0–10.5)
nRBC: 0 % (ref 0.0–0.2)

## 2019-08-10 MED ORDER — IOHEXOL 9 MG/ML PO SOLN
ORAL | Status: AC
  Filled 2019-08-10: qty 1000

## 2019-08-10 MED ORDER — IOHEXOL 350 MG/ML SOLN
100.0000 mL | Freq: Once | INTRAVENOUS | Status: AC | PRN
  Administered 2019-08-10: 100 mL via INTRAVENOUS

## 2019-08-10 MED ORDER — PANTOPRAZOLE SODIUM 40 MG PO TBEC
40.0000 mg | DELAYED_RELEASE_TABLET | Freq: Every day | ORAL | Status: DC
  Administered 2019-08-10 – 2019-08-28 (×17): 40 mg via ORAL
  Filled 2019-08-10 (×18): qty 1

## 2019-08-10 MED ORDER — IOHEXOL 9 MG/ML PO SOLN
500.0000 mL | ORAL | Status: AC
  Administered 2019-08-10: 500 mL via ORAL

## 2019-08-10 NOTE — Progress Notes (Signed)
Lower venous duplex       has been completed. Preliminary results can be found under CV proc through chart review. Mary-Ann Pennella, BS, RDMS, RVT   

## 2019-08-10 NOTE — Progress Notes (Signed)
Patient ID: Sandra Bonilla, female   DOB: 1975-06-24, 44 y.o.   MRN: [id number]   CT negative for PE. Does have increased fluid and some increased air. Will make NPO at midnight in case she may require a procedure tomorrow.

## 2019-08-10 NOTE — Progress Notes (Signed)

## 2019-08-10 NOTE — Progress Notes (Signed)
Called for tachycardia, she felt dizzy and sweaty.  She thinks both feet are swollen Right greater than left.   On exam she looks fine, BP134/72, HR 121, Sat 95% on RA.  T 99.6.  EKG shows Sinus tachycardia, otherwise normal in appearance  PE: General:  Comfortable, no distress VS above  Chest: Clear, but uncomfortable with deep inspiration right chest Abdomen:  Soft, normal tenderness, port sites and incisions look fine.   Lower legs:  42 cm calf left, 44 cm calf right     Prior to Admission medications   Medication Sig Start Date End Date Taking? Authorizing Provider  AUBAGIO 14 MG TABS TAKE 1 TABLET BY MOUTH  DAILY Patient taking differently: Take 14 mg by mouth daily.  02/28/19  Yes Sater, Nanine Means, MD  chlorthalidone (HYGROTON) 25 MG tablet Take 12.5 mg by mouth daily.   Yes [provider]  cholecalciferol (VITAMIN D3) 25 MCG (1000 UT) tablet Take 1,000 Units by mouth daily.   Yes [provider]  ferrous gluconate (FERGON) 324 MG tablet TAKE 1 TABLET(324 MG) BY MOUTH DAILY WITH BREAKFAST Patient taking differently: Take 324 mg by mouth daily with breakfast.  05/18/19  Yes Mansouraty, Telford Nab., MD  folic acid (FOLVITE) 1 MG tablet TAKE 1 TABLET(1 MG) BY MOUTH DAILY Patient taking differently: Take 1 mg by mouth daily.  07/04/19  Yes Mansouraty, Telford Nab., MD  LORazepam (ATIVAN) 1 MG tablet Take 1 mg by mouth 2 (two) times daily as needed for anxiety.  12/15/18  Yes [provider]  omeprazole (PRILOSEC) 40 MG capsule TAKE 1 CAPSULE(40 MG) BY MOUTH DAILY BEFORE BREAKFAST 08/03/19  Yes Mansouraty, Telford Nab., MD  oxybutynin (DITROPAN) 5 MG tablet TAKE 1 TABLET(5 MG) BY MOUTH TWICE DAILY Patient taking differently: Take 5 mg by mouth 2 (two) times daily.  04/13/19  Yes Sater, Nanine Means, MD  potassium chloride (MICRO-K) 10 MEQ CR capsule Take 10 mEq by mouth every morning. 06/29/19  Yes [provider]  Probiotic Product (PROBIOTIC PO) Take 1 capsule  by mouth daily.   Yes [provider]  TRELEGY ELLIPTA 100-62.5-25 MCG/INH AEPB Inhale 1 puff into the lungs daily. 02/10/19  Yes [provider]  acetaminophen (TYLENOL) 325 MG tablet Take 2 tablets (650 mg total) by mouth every 6 (six) hours as needed for mild pain (or Fever >/= 101). 11/05/18   Samuella Cota, MD  cyclobenzaprine (FLEXERIL) 5 MG tablet Take 1 tablet (5 mg total) by mouth at bedtime as needed. Patient taking differently: Take 5 mg by mouth at bedtime as needed for muscle spasms.  12/26/16   Sater, Nanine Means, MD  gabapentin (NEURONTIN) 100 MG capsule Take 100 mg by mouth daily as needed (nerve pain).  05/19/18   [provider]  levonorgestrel (MIRENA) 20 MCG/24HR IUD 1 each by Intrauterine route once. 11/14/15   [provider]  meloxicam (MOBIC) 15 MG tablet Take 1 tablet (15 mg total) by mouth daily. Patient taking differently: Take 15 mg by mouth daily as needed for pain.  12/26/16   Sater, Nanine Means, MD  Polyvinyl Alcohol-Povidone (REFRESH OP) Place 2 drops into both eyes 4 (four) times daily as needed (dryness).    [provider]    Plan:  Repeat CT abdomen with contrast, chest CT to rule out PE and LE doppers for DVT.  She is on Lovenox for the last 6 days.

## 2019-08-10 NOTE — Progress Notes (Signed)
Notified by NT that pt became dizzy while sitting up in the chair and HR increased to 144. BP 124/82, RR 20, SpO2 96% RA, 99.6. Yellow MEWs initiated, vital frequency increased per protocol. Pt denied any chest pain or SOB. CCS paged. Will Allen returned page. EKG complete, given to [employer], [place].

## 2019-08-10 NOTE — Progress Notes (Signed)
6 Days Post-Op robotic sigmoidectomy Subjective: Pt continues to slowly improve.  Having less pain.  Had a couple BM's and passing flatus.  No fevers Objective: Vital signs in last 24 hours: Temp:  [97.9 F (36.6 C)-98.4 F (36.9 C)] 97.9 F (36.6 C) (05/12 0535) Pulse Rate:  [97-109] 101 (05/12 0535) Resp:  [16-18] 17 (05/12 0535) BP: (117-131)/(79-81) 117/81 (05/12 0535) SpO2:  [96 %-97 %] 97 % (05/12 0535) Weight:  [102.4 kg] 102.4 kg (05/12 0500)   Intake/Output from previous day: 05/11 0701 - 05/12 0700 In: 2709.7 [P.O.:1260; I.V.:1430.8; IV Piggyback:18.9] Out: -  Intake/Output this shift: No intake/output data recorded.   General appearance: alert and cooperative GI: normal findings: soft, non-distended  Incision: no significant drainage  Lab Results:  Recent Labs    08/09/19 0235 08/10/19 0252  WBC 15.5* 14.6*  HGB 11.8* 10.2*  HCT 37.6 32.1*  PLT 350 296   BMET Recent Labs    08/09/19 0235 08/10/19 0252  NA 134* 139  K 3.5 3.5  CL 102 107  CO2 22 22  GLUCOSE 124* 77  BUN 13 10  CREATININE 0.80 0.56  CALCIUM 7.6* 7.1*   PT/INR No results for input(s): LABPROT, INR in the last 72 hours. ABG No results for input(s): PHART, HCO3 in the last 72 hours.  Invalid input(s): PCO2, PO2  MEDS, Scheduled . acetaminophen  1,000 mg Oral Q6H  . enoxaparin (LOVENOX) injection  40 mg Subcutaneous Q24H  . fluticasone furoate-vilanterol  1 puff Inhalation Daily   And  . umeclidinium bromide  1 puff Inhalation Daily  . gabapentin  300 mg Oral BID  . pantoprazole (PROTONIX) IV  40 mg Intravenous Q24H    Studies/Results: No results found.  Assessment: s/p Procedure(s): XI ROBOT ASSISTED SIGMOIDECTOMY FIREFLY INJECTION Patient Active Problem List   Diagnosis Date Noted  . Diverticular disease 08/04/2019  . 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 Absolute anemia 06/25/2019  . Change in bowel habits 06/25/2019  . IUD threads lost 01/26/2019  .  History of abnormal cervical Pap smear 01/25/2019  . Hx of diverticulitis of colon 12/24/2018  . Dyspepsia 12/24/2018  . Non-intractable vomiting 12/24/2018  . Abnormal CT scan, pelvis 12/24/2018  . Urinary dysfunction 12/30/2017  . Varicose veins of bilateral lower extremities with pain 10/28/2017  . Low grade squamous intraepithelial lesion (LGSIL) on cervical Pap smear 10/29/2016  . 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 Head injury 06/25/2016  . Essential hypertension 04/22/2016  . Diverticulitis of large intestine with complication   . Multiple sclerosis ([place])   . Diverticulitis 04/21/2016  . Other headache syndrome 02/05/2016  . Vitamin D deficiency 10/09/2015  . Other fatigue 10/09/2015  . Dysesthesia 04/11/2015  . Depression with anxiety 04/11/2015  . Gait disturbance 12/13/2014  . Insomnia 12/13/2014  . Multiple sclerosis, relapsing-remitting ([place]) 08/30/2012  . Relapsing remitting multiple sclerosis ([place]) 08/30/2012    Post op ileus, resolving  Plan: Wbc continues to trend down Encourage pt to ambulate and use IS regularly  Recheck labs in AM Advance to fulls KVO IVF's Cont abx. unclear if there is an infectious etiology or not Will have low threshold to repeat CT if pt does not continue to improve   LOS: 6 days     .Rosario Adie, MD [facility], [place]    08/10/2019 9:16 AM

## 2019-08-11 ENCOUNTER — Encounter (HOSPITAL_COMMUNITY)
Admission: RE | Disposition: A | Discharge status: Home / Self Care | Payer: Self-pay | Source: Home / Self Care | Attending: General Surgery

## 2019-08-11 ENCOUNTER — Inpatient Hospital Stay (HOSPITAL_COMMUNITY): Payer: BC Managed Care – PPO | Admitting: Certified Registered"

## 2019-08-11 ENCOUNTER — Encounter (HOSPITAL_COMMUNITY): Payer: Self-pay | Admitting: General Surgery

## 2019-08-11 HISTORY — PX: COLON RESECTION: SHX5231

## 2019-08-11 LAB — CBC
HCT: 32.1 % — ABNORMAL LOW (ref 36.0–46.0)
Hemoglobin: 10.4 g/dL — ABNORMAL LOW (ref 12.0–15.0)
MCH: 26.7 pg (ref 26.0–34.0)
MCHC: 32.4 g/dL (ref 30.0–36.0)
MCV: 82.3 fL (ref 80.0–100.0)
Platelets: 349 10*3/uL (ref 150–400)
RBC: 3.9 MIL/uL (ref 3.87–5.11)
RDW: 17.1 % — ABNORMAL HIGH (ref 11.5–15.5)
WBC: 20.5 10*3/uL — ABNORMAL HIGH (ref 4.0–10.5)
nRBC: 0 % (ref 0.0–0.2)

## 2019-08-11 LAB — BASIC METABOLIC PANEL
Anion gap: 13 (ref 5–15)
BUN: 7 mg/dL (ref 6–20)
CO2: 21 mmol/L — ABNORMAL LOW (ref 22–32)
Calcium: 7.6 mg/dL — ABNORMAL LOW (ref 8.9–10.3)
Chloride: 102 mmol/L (ref 98–111)
Creatinine, Ser: 0.58 mg/dL (ref 0.44–1.00)
GFR calc Af Amer: >60 mL/min (ref >60)
GFR calc non Af Amer: >60 mL/min (ref >60)
Glucose, Bld: 68 mg/dL — ABNORMAL LOW (ref 70–99)
Potassium: 3.2 mmol/L — ABNORMAL LOW (ref 3.5–5.1)
Sodium: 136 mmol/L (ref 135–145)

## 2019-08-11 SURGERY — COLON RESECTION LAPAROSCOPIC
Anesthesia: General | Site: Abdomen

## 2019-08-11 MED ORDER — SUCCINYLCHOLINE CHLORIDE 200 MG/10ML IV SOSY
PREFILLED_SYRINGE | INTRAVENOUS | Status: DC | PRN
  Administered 2019-08-11: 100 mg via INTRAVENOUS

## 2019-08-11 MED ORDER — SODIUM CHLORIDE 0.9 % IV SOLN: INTRAVENOUS | Status: DC | PRN

## 2019-08-11 MED ORDER — FENTANYL CITRATE (PF) 100 MCG/2ML IJ SOLN
INTRAMUSCULAR | Status: AC
  Filled 2019-08-11: qty 2

## 2019-08-11 MED ORDER — SODIUM CHLORIDE (PF) 0.9 % IJ SOLN
INTRAMUSCULAR | Status: AC
  Filled 2019-08-11: qty 30

## 2019-08-11 MED ORDER — POTASSIUM CHLORIDE 10 MEQ/100ML IV SOLN
10.0000 meq | INTRAVENOUS | Status: AC
  Administered 2019-08-11 (×3): 10 meq via INTRAVENOUS
  Filled 2019-08-11 (×3): qty 100

## 2019-08-11 MED ORDER — FENTANYL CITRATE (PF) 100 MCG/2ML IJ SOLN
INTRAMUSCULAR | Status: DC | PRN
  Administered 2019-08-11 (×7): 50 ug via INTRAVENOUS

## 2019-08-11 MED ORDER — ONDANSETRON HCL 4 MG/2ML IJ SOLN: 4.0000 mg | Freq: Once | INTRAMUSCULAR | Status: DC | PRN

## 2019-08-11 MED ORDER — OXYCODONE HCL 5 MG PO TABS: 5.0000 mg | ORAL_TABLET | Freq: Once | ORAL | Status: DC | PRN

## 2019-08-11 MED ORDER — OXYCODONE HCL 5 MG/5ML PO SOLN: 5.0000 mg | Freq: Once | ORAL | Status: DC | PRN

## 2019-08-11 MED ORDER — LIDOCAINE HCL 2 % IJ SOLN
INTRAMUSCULAR | Status: AC
  Filled 2019-08-11: qty 20

## 2019-08-11 MED ORDER — PHENYLEPHRINE 40 MCG/ML (10ML) SYRINGE FOR IV PUSH (FOR BLOOD PRESSURE SUPPORT)
PREFILLED_SYRINGE | INTRAVENOUS | Status: AC
  Filled 2019-08-11: qty 30

## 2019-08-11 MED ORDER — FENTANYL CITRATE (PF) 100 MCG/2ML IJ SOLN
25.0000 ug | INTRAMUSCULAR | Status: DC | PRN
  Administered 2019-08-11 (×2): 50 ug via INTRAVENOUS

## 2019-08-11 MED ORDER — CHLORHEXIDINE GLUCONATE CLOTH 2 % EX PADS
6.0000 | MEDICATED_PAD | Freq: Every day | CUTANEOUS | Status: DC
  Administered 2019-08-12 – 2019-08-29 (×17): 6 via TOPICAL

## 2019-08-11 MED ORDER — MIDAZOLAM HCL 2 MG/2ML IJ SOLN
INTRAMUSCULAR | Status: AC
  Filled 2019-08-11: qty 2

## 2019-08-11 MED ORDER — DEXAMETHASONE SODIUM PHOSPHATE 10 MG/ML IJ SOLN
INTRAMUSCULAR | Status: DC | PRN
  Administered 2019-08-11: 6 mg via INTRAVENOUS

## 2019-08-11 MED ORDER — DEXMEDETOMIDINE HCL 200 MCG/2ML IV SOLN
INTRAVENOUS | Status: DC | PRN
  Administered 2019-08-11: 4 ug via INTRAVENOUS
  Administered 2019-08-11 (×2): 8 ug via INTRAVENOUS

## 2019-08-11 MED ORDER — FENTANYL CITRATE (PF) 250 MCG/5ML IJ SOLN
INTRAMUSCULAR | Status: AC
  Filled 2019-08-11: qty 5

## 2019-08-11 MED ORDER — MIDAZOLAM HCL 5 MG/5ML IJ SOLN
INTRAMUSCULAR | Status: DC | PRN
  Administered 2019-08-11: 2 mg via INTRAVENOUS

## 2019-08-11 MED ORDER — LACTATED RINGERS IV SOLN: INTRAVENOUS | Status: DC

## 2019-08-11 MED ORDER — KETAMINE HCL 10 MG/ML IJ SOLN
INTRAMUSCULAR | Status: DC | PRN
  Administered 2019-08-11: 20 mg via INTRAVENOUS
  Administered 2019-08-11: 30 mg via INTRAVENOUS

## 2019-08-11 MED ORDER — PROPOFOL 10 MG/ML IV BOLUS
INTRAVENOUS | Status: AC
  Filled 2019-08-11: qty 20

## 2019-08-11 MED ORDER — ROCURONIUM BROMIDE 10 MG/ML (PF) SYRINGE
PREFILLED_SYRINGE | INTRAVENOUS | Status: DC | PRN
  Administered 2019-08-11: 20 mg via INTRAVENOUS
  Administered 2019-08-11: 50 mg via INTRAVENOUS
  Administered 2019-08-11: 15 mg via INTRAVENOUS

## 2019-08-11 MED ORDER — ISOPROPYL ALCOHOL 70 % SOLN
Status: AC
  Filled 2019-08-11: qty 480

## 2019-08-11 MED ORDER — BUPIVACAINE HCL (PF) 0.25 % IJ SOLN
INTRAMUSCULAR | Status: DC | PRN
  Administered 2019-08-11: 30 mL

## 2019-08-11 MED ORDER — PROPOFOL 10 MG/ML IV BOLUS
INTRAVENOUS | Status: DC | PRN
  Administered 2019-08-11: 50 mg via INTRAVENOUS
  Administered 2019-08-11: 200 mg via INTRAVENOUS

## 2019-08-11 MED ORDER — SODIUM CHLORIDE 0.9 % IV SOLN: INTRAVENOUS | Status: AC | PRN

## 2019-08-11 MED ORDER — BUPIVACAINE HCL (PF) 0.25 % IJ SOLN
INTRAMUSCULAR | Status: AC
  Filled 2019-08-11: qty 30

## 2019-08-11 MED ORDER — KETAMINE HCL 10 MG/ML IJ SOLN
INTRAMUSCULAR | Status: AC
  Filled 2019-08-11: qty 1

## 2019-08-11 MED ORDER — LIDOCAINE 20MG/ML (2%) 15 ML SYRINGE OPTIME
INTRAMUSCULAR | Status: DC | PRN
  Administered 2019-08-11: 1.5 mg/kg/h via INTRAVENOUS

## 2019-08-11 MED ORDER — 0.9 % SODIUM CHLORIDE (POUR BTL) OPTIME
TOPICAL | Status: DC | PRN
  Administered 2019-08-11: 2000 mL

## 2019-08-11 MED ORDER — SUGAMMADEX SODIUM 200 MG/2ML IV SOLN
INTRAVENOUS | Status: DC | PRN
  Administered 2019-08-11: 200 mg via INTRAVENOUS

## 2019-08-11 MED ORDER — BUPIVACAINE LIPOSOME 1.3 % IJ SUSP
20.0000 mL | Freq: Once | INTRAMUSCULAR | Status: AC
  Administered 2019-08-11: 20 mL
  Filled 2019-08-11: qty 20

## 2019-08-11 MED ORDER — ONDANSETRON HCL 4 MG/2ML IJ SOLN
INTRAMUSCULAR | Status: DC | PRN
  Administered 2019-08-11: 4 mg via INTRAVENOUS

## 2019-08-11 MED ORDER — PHENYLEPHRINE 40 MCG/ML (10ML) SYRINGE FOR IV PUSH (FOR BLOOD PRESSURE SUPPORT)
PREFILLED_SYRINGE | INTRAVENOUS | Status: DC | PRN
  Administered 2019-08-11: 160 ug via INTRAVENOUS
  Administered 2019-08-11: 40 ug via INTRAVENOUS
  Administered 2019-08-11: 120 ug via INTRAVENOUS
  Administered 2019-08-11: 80 ug via INTRAVENOUS
  Administered 2019-08-11: 40 ug via INTRAVENOUS
  Administered 2019-08-11: 80 ug via INTRAVENOUS

## 2019-08-11 MED ORDER — LIDOCAINE 2% (20 MG/ML) 5 ML SYRINGE
INTRAMUSCULAR | Status: DC | PRN
  Administered 2019-08-11: 40 mg via INTRAVENOUS

## 2019-08-11 MED ORDER — LACTATED RINGERS IV SOLN
INTRAVENOUS | Status: DC | PRN
Start: 2019-08-11 — End: 2019-08-11

## 2019-08-11 MED ORDER — LACTATED RINGERS IR SOLN
Status: DC | PRN
  Administered 2019-08-11: 1000 mL
  Administered 2019-08-11: 3000 mL

## 2019-08-11 SURGICAL SUPPLY — 67 items
APPLIER CLIP 5 13 M/L LIGAMAX5 (MISCELLANEOUS)
BARRIER SKIN 2 3/4 (OSTOMY) ×2 IMPLANT
BARRIER SKIN OD2.25 2 3/4 FLNG (OSTOMY) IMPLANT
BLADE EXTENDED COATED 6.5IN (ELECTRODE) IMPLANT
CABLE HIGH FREQUENCY MONO STRZ (ELECTRODE) IMPLANT
CATH ROBINSON RED A/P 18FR (CATHETERS) ×1 IMPLANT
CELLS DAT CNTRL 66122 CELL SVR (MISCELLANEOUS) IMPLANT
CHLORAPREP W/TINT 26 (MISCELLANEOUS) ×1 IMPLANT
CLIP APPLIE 5 13 M/L LIGAMAX5 (MISCELLANEOUS) IMPLANT
COVER WAND RF STERILE (DRAPES) ×2 IMPLANT
DECANTER SPIKE VIAL GLASS SM (MISCELLANEOUS) ×1 IMPLANT
DERMABOND ADVANCED (GAUZE/BANDAGES/DRESSINGS) ×1
DERMABOND ADVANCED .7 DNX12 (GAUZE/BANDAGES/DRESSINGS) ×1 IMPLANT
DRAIN CHANNEL 19F RND (DRAIN) ×1 IMPLANT
DRAPE LAPAROSCOPIC ABDOMINAL (DRAPES) ×1 IMPLANT
DRAPE SURG IRRIG POUCH 19X23 (DRAPES) ×2 IMPLANT
DRSG OPSITE POSTOP 4X10 (GAUZE/BANDAGES/DRESSINGS) IMPLANT
DRSG OPSITE POSTOP 4X6 (GAUZE/BANDAGES/DRESSINGS) IMPLANT
DRSG OPSITE POSTOP 4X8 (GAUZE/BANDAGES/DRESSINGS) IMPLANT
ELECT REM PT RETURN 15FT ADLT (MISCELLANEOUS) ×2 IMPLANT
EVACUATOR SILICONE 100CC (DRAIN) ×1 IMPLANT
GAUZE SPONGE 4X4 12PLY STRL (GAUZE/BANDAGES/DRESSINGS) IMPLANT
GLOVE BIO SURGEON STRL SZ 6.5 (GLOVE) ×4 IMPLANT
GLOVE BIOGEL PI IND STRL 7.0 (GLOVE) ×2 IMPLANT
GLOVE BIOGEL PI INDICATOR 7.0 (GLOVE) ×2
GOWN STRL REUS W/TWL XL LVL3 (GOWN DISPOSABLE) ×12 IMPLANT
GRASPER ENDOPATH ANVIL 10MM (MISCELLANEOUS) IMPLANT
HOLDER FOLEY CATH W/STRAP (MISCELLANEOUS) ×2 IMPLANT
IRRIG SUCT STRYKERFLOW 2 WTIP (MISCELLANEOUS) ×2
IRRIGATION SUCT STRKRFLW 2 WTP (MISCELLANEOUS) ×1 IMPLANT
KIT TURNOVER KIT A (KITS) IMPLANT
NDL INSUFFLATION 14GA 150MM (NEEDLE) IMPLANT
NEEDLE INSUFFLATION 14GA 150MM (NEEDLE) ×2 IMPLANT
PAD POSITIONING PINK XL (MISCELLANEOUS) ×2 IMPLANT
PENCIL SMOKE EVACUATOR (MISCELLANEOUS) IMPLANT
PORT LAP GEL ALEXIS MED 5-9CM (MISCELLANEOUS) ×2 IMPLANT
POUCH OSTOMY 2 3/4  H 3804 (WOUND CARE) ×1
POUCH OSTOMY 2 PC DRNBL 2.75 (WOUND CARE) IMPLANT
PROTECTOR NERVE ULNAR (MISCELLANEOUS) ×3 IMPLANT
RETRACTOR WND ALEXIS 18 MED (MISCELLANEOUS) IMPLANT
RTRCTR WOUND ALEXIS 18CM MED (MISCELLANEOUS)
SCISSORS LAP 5X35 DISP (ENDOMECHANICALS) ×1 IMPLANT
SEALER TISSUE G2 STRG ARTC 35C (ENDOMECHANICALS) ×1 IMPLANT
SET TUBE SMOKE EVAC HIGH FLOW (TUBING) ×2 IMPLANT
SLEEVE XCEL OPT CAN 5 100 (ENDOMECHANICALS) ×3 IMPLANT
SPONGE DRAIN TRACH 4X4 STRL 2S (GAUZE/BANDAGES/DRESSINGS) IMPLANT
SPONGE LAP 18X18 RF (DISPOSABLE) IMPLANT
SURGILUBE 2OZ TUBE FLIPTOP (MISCELLANEOUS) ×1 IMPLANT
SUT ETHILON 2 0 PS N (SUTURE) ×1 IMPLANT
SUT NOVA NAB DX-16 0-1 5-0 T12 (SUTURE) ×4 IMPLANT
SUT PROLENE 2 0 KS (SUTURE) IMPLANT
SUT SILK 2 0 (SUTURE) ×1
SUT SILK 2 0 SH CR/8 (SUTURE) ×1 IMPLANT
SUT SILK 2-0 18XBRD TIE 12 (SUTURE) ×1 IMPLANT
SUT SILK 3 0 (SUTURE)
SUT SILK 3 0 SH CR/8 (SUTURE) ×2 IMPLANT
SUT SILK 3-0 18XBRD TIE 12 (SUTURE) IMPLANT
SUT VIC AB 2-0 SH 18 (SUTURE) ×4 IMPLANT
SUT VIC AB 4-0 PS2 27 (SUTURE) ×2 IMPLANT
SUT VICRYL 0 UR6 27IN ABS (SUTURE) ×2 IMPLANT
SYS LAPSCP GELPORT 120MM (MISCELLANEOUS)
SYSTEM LAPSCP GELPORT 120MM (MISCELLANEOUS) IMPLANT
TOWEL OR NON WOVEN STRL DISP B (DISPOSABLE) ×2 IMPLANT
TRAY COLON PACK (CUSTOM PROCEDURE TRAY) ×2 IMPLANT
TRAY FOLEY MTR SLVR 14FR STAT (SET/KITS/TRAYS/PACK) ×1 IMPLANT
TROCAR BLADELESS OPT 5 100 (ENDOMECHANICALS) ×2 IMPLANT
TROCAR XCEL BLUNT TIP 100MML (ENDOMECHANICALS) IMPLANT

## 2019-08-11 NOTE — Progress Notes (Signed)
Note: Portions of this report may have been transcribed using voice recognition software. A sincere effort was made to ensure accuracy; however, inadvertent computerized transcription errors may be present.   Any transcriptional errors that result from this process are unintentional.        Sandra Bonilla  02-May-1975 [id number]  Patient Care Team: [name], MD as PCP - General (Internal Medicine) [name], MD as Consulting Physician (Colon and Rectal Surgery)  Doppler examination negative for DVT.  CT scan with increased free air and some fluid.  Has discomfort in right upper quadrant.  Less so in the pelvis.  Most likely concerning for delayed anastomotic leak.  Dr. [name] aware.  Most likely plan surgical washout later today.  Continue bowel rest and antibiotics.  Patient Active Problem List   Diagnosis Date Noted  . Diverticular disease 08/04/2019  . 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 Absolute anemia 06/25/2019  . Change in bowel habits 06/25/2019  . IUD threads lost 01/26/2019  . History of abnormal cervical Pap smear 01/25/2019  . Hx of diverticulitis of colon 12/24/2018  . Dyspepsia 12/24/2018  . Non-intractable vomiting 12/24/2018  . Abnormal CT scan, pelvis 12/24/2018  . Urinary dysfunction 12/30/2017  . Varicose veins of bilateral lower extremities with pain 10/28/2017  . Low grade squamous intraepithelial lesion (LGSIL) on cervical Pap smear 10/29/2016  . 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 Head injury 06/25/2016  . Essential hypertension 04/22/2016  . Diverticulitis of large intestine with complication   . Multiple sclerosis ([place])   . Diverticulitis 04/21/2016  . Other headache syndrome 02/05/2016  . Vitamin D deficiency 10/09/2015  . Other fatigue 10/09/2015  . Dysesthesia 04/11/2015  . Depression with anxiety 04/11/2015  . Gait disturbance 12/13/2014  . Insomnia 12/13/2014  . Multiple  sclerosis, relapsing-remitting ([place]) 08/30/2012  . Relapsing remitting multiple sclerosis ([place]) 08/30/2012    Past Medical History:  Diagnosis Date  . Anemia 05/2019   iron infusions  . Anxiety   . Asthma   . Cervical high risk human papillomavirus (HPV) DNA test positive 09/2015  . Cholecystitis 2006   GALBLADDER REMOVED  . Depression   . Diverticulitis   . Diverticulosis   . 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 History of mammogram 11/2014   FIBROADENOMA  . History of Papanicolaou smear of cervix 01/24/13; 10/15/15   -/-; -/+  . Hypertension   . Menorrhagia 02/01/2010   D/C HYSTEROSCOPY ENDO POLYP  . MS (multiple sclerosis) ([place])   . Ovarian cyst 9/15; 2015-2016   LEFT - WENT TO [place]; RIGHT  . 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 BIOPSY  03/21/2019   Procedure: BIOPSY;  Surgeon: Rush Landmark Telford Nab., MD;  Location: [place];  Service: Gastroenterology;;  . BREAST BIOPSY Right 2016   Fibroadenoma  . CHOLECYSTECTOMY  2006  . COLONOSCOPY WITH PROPOFOL N/A 03/21/2019   Procedure: COLONOSCOPY WITH PROPOFOL;  Surgeon: Rush Landmark Telford Nab., MD;  Location: [place];  Service: Gastroenterology;  Laterality: N/A;  ultraslim colonoscope   . CYSTOSCOPY WITH INJECTION N/A 08/04/2019   Procedure: FIREFLY INJECTION;  Surgeon: Ardis Hughs, MD;  Location: [facility];  Service: Urology;  Laterality: N/A;  . DILATION AND CURETTAGE, DIAGNOSTIC / THERAPEUTIC  02/01/2010   D/C HYSTEROSCOPY ENDO POLYP; [facility]  . ENDOSCOPIC MUCOSAL RESECTION  03/21/2019   Procedure: ENDOSCOPIC MUCOSAL RESECTION;  Surgeon: Justice Britain  Brooke Bonito., MD;  Location: [place];  Service: Gastroenterology;;  . ESOPHAGOGASTRODUODENOSCOPY (EGD) WITH PROPOFOL N/A 03/21/2019   Procedure: ESOPHAGOGASTRODUODENOSCOPY (EGD) WITH PROPOFOL;  Surgeon: [name], MD;  Location: [place];  Service: Gastroenterology;  Laterality:  N/A;  . EYE SURGERY     Lasik  . FLEXIBLE SIGMOIDOSCOPY  07/31/2016   Procedure: FLEXIBLE SIGMOIDOSCOPY;  Surgeon: [name], MD;  Location: [facility];  Service: Endoscopy;;  . HEMOSTASIS CLIP PLACEMENT  03/21/2019   Procedure: HEMOSTASIS CLIP PLACEMENT;  Surgeon: [name], MD;  Location: [place];  Service: Gastroenterology;;  . HYSTEROSCOPY  02/01/2010   ENDO POLYP - [facility]  . IR RADIOLOGIST EVAL & MGMT  11/16/2018  . SCLEROTHERAPY  03/21/2019   Procedure: Clide Deutscher;  Surgeon: Mansouraty, Telford Nab., MD;  Location: [facility];  Service: Gastroenterology;;    Social History   Socioeconomic History  . Marital status: Divorced    Spouse name: Not on file  . Number of children: 0  . Years of education: 58  . Highest education level: Not on file  Occupational History    Employer: [employer]  Tobacco Use  . Smoking status: Former Smoker    Years: 10.00    Quit date: 2010    Years since quitting: 11.3  . Smokeless tobacco: Never Used  Substance and Sexual Activity  . Alcohol use: No  . Drug use: No  . Sexual activity: Not Currently    Partners: Male    Birth control/protection: I.U.D.    Comment: Mirena  Other Topics Concern  . Not on file  Social History Narrative  . Not on file   Social Determinants of Health   Financial Resource Strain:   . Difficulty of Paying Living Expenses:   Food Insecurity:   . Worried About Charity fundraiser in the Last Year:   . Arboriculturist in the Last Year:   Transportation Needs:   . Film/video editor (Medical):   Marland Kitchen Lack of Transportation (Non-Medical):   Physical Activity:   . Days of Exercise per Week:   . Minutes of Exercise per Session:   Stress:   . Feeling of Stress :   Social Connections:   . Frequency of Communication with Friends and Family:   . Frequency of Social Gatherings with Friends and Family:   . Attends Religious Services:   . Active Member of Clubs or Organizations:   .  Attends Archivist Meetings:   Marland Kitchen Marital Status:   Intimate Partner Violence:   . Fear of Current or Ex-Partner:   . Emotionally Abused:   Marland Kitchen Physically Abused:   . Sexually Abused:     Family History  Problem Relation Age of Onset  . Breast cancer Maternal Aunt 50       has contact  . Rheum arthritis Mother   . Hypertension Mother   . Thyroid disease Mother        HYPOTHYROIDISM  . Lung cancer Mother 60  . Heart disease Father   . Diabetes Father   . Hypertension Father   . Cancer Paternal Grandmother        MELANOMA OF SKIN  . Cancer Cousin        KIDNEY-COUSIN  . Colon cancer Neg Hx   . Stomach cancer Neg Hx   . Pancreatic cancer Neg Hx   . Esophageal cancer Neg Hx   . Inflammatory bowel disease Neg Hx   . Rectal cancer Neg Hx  Current Facility-Administered Medications  Medication Dose Route Frequency Provider Last Rate Last Admin  . 0.9 %  sodium chloride infusion   Intravenous PRN [name], MD      . 0.9 % NaCl with KCl 20 mEq/ L  infusion   Intravenous Continuous [name], MD 10 mL/hr at 08/11/19 0600 Rate Verify at 08/11/19 0600  . alum & mag hydroxide-simeth (MAALOX/MYLANTA) 200-200-20 MG/5ML suspension 30 mL  30 mL Oral Q6H PRN Stark Klein, MD      . diphenhydrAMINE (BENADRYL) injection 25 mg  25 mg Intravenous [id number] PRN [name], MD      . enoxaparin (LOVENOX) injection 40 mg  40 mg Subcutaneous [id number] [name], MD   40 mg at 08/10/19 0851  . fluticasone furoate-vilanterol (BREO ELLIPTA) 100-25 MCG/INH 1 puff  1 puff Inhalation Daily [name], MD       And  . umeclidinium bromide (INCRUSE ELLIPTA) 62.5 MCG/INH 1 puff  1 puff Inhalation Daily [name], MD      . gabapentin (NEURONTIN) capsule 300 mg  300 mg Oral BID Alphonsa Overall, MD   300 mg at 08/10/19 2159  . HYDROmorphone (DILAUDID) injection 0.5-1.5 mg  0.5-1.5 mg Intravenous Q2H PRN Alphonsa Overall, MD   0.5 mg at 08/10/19 0848  . ondansetron (ZOFRAN) injection 4  mg  4 mg Intravenous [id number] PRN [name], MD   4 mg at [id number]  . pantoprazole (PROTONIX) EC tablet 40 mg  40 mg Oral QHS Eudelia Bunch, RPH   40 mg at 08/10/19 2159  . piperacillin-tazobactam (ZOSYN) IVPB 3.375 g  3.375 g Intravenous Q8H Coralie Keens, MD 12.5 mL/hr at 08/11/19 0512 3.375 g at 08/11/19 0512  . simethicone (MYLICON) chewable tablet 160 mg  160 mg Oral [id number] PRN [name], MD   0000000 mg at 08/10/19 0020     Allergies  Allergen Reactions  . Sulfamethoxazole-Trimethoprim Rash and Hives  . Sulfa Antibiotics Other (See Comments)    BP 131/81 (BP Location: Right Arm)   Pulse (!) 109   Temp 98.2 F (36.8 C) (Oral)   Resp 20   Ht 5\' 6"  (1.676 m)   Wt 101.7 kg   LMP 07/22/2019 (Exact Date)   SpO2 99%   BMI 36.19 kg/m   CT ANGIO CHEST PE W OR WO CONTRAST  Result Date: 08/10/2019 CLINICAL DATA:  Shortness of breath and tachycardia, peripheral leg swelling and abdominal pain, history of recent sigmoid surgery EXAM: CT ANGIOGRAPHY CHEST CT ABDOMEN AND PELVIS WITH CONTRAST TECHNIQUE: Multidetector CT imaging of the chest was performed using the standard protocol during bolus administration of intravenous contrast. Multiplanar CT image reconstructions and MIPs were obtained to evaluate the vascular anatomy. Multidetector CT imaging of the abdomen and pelvis was performed using the standard protocol during bolus administration of intravenous contrast. CONTRAST:  141mL OMNIPAQUE IOHEXOL 350 MG/ML SOLN COMPARISON:  08/07/2019 FINDINGS: CTA CHEST FINDINGS Cardiovascular: Thoracic aorta and its branches are within normal limits. No aneurysmal dilatation or dissection is seen. The heart is not significantly enlarged. No pericardial effusion is seen. No significant coronary calcifications are noted. The pulmonary artery shows no large central pulmonary embolus. The peripheral branches are somewhat limited by patient motion artifact. Mediastinum/Nodes: Thoracic inlet is within  normal limits. Small mediastinal lymph nodes are noted likely reactive in nature. No sizable adenopathy is seen. The esophagus appears within normal limits. Lungs/Pleura: Small bilateral pleural effusions are noted. Bibasilar consolidation is seen which may be related to the  postoperative state although superimposed pneumonia cannot be totally excluded. No pneumothorax is identified. No sizable parenchymal nodules are seen. Musculoskeletal: No acute bony abnormality is noted. Review of the MIP images confirms the above findings. CT ABDOMEN and PELVIS FINDINGS Hepatobiliary: No focal liver abnormality is seen. Status post cholecystectomy. No biliary dilatation. Pancreas: Unremarkable. No pancreatic ductal dilatation or surrounding inflammatory changes. Spleen: Normal in size without focal abnormality. Adrenals/Urinary Tract: Adrenal glands show a left adrenal hypodense lesion likely representing adenoma stable in appearance. The kidneys demonstrate a normal enhancement pattern bilaterally. No renal calculi or urinary tract obstructive changes are seen. Delayed images demonstrate normal excretion of contrast material. No obstructive changes are seen. The bladder is decompressed. Stomach/Bowel: Postsurgical changes are noted in the sigmoid similar to that seen on prior examination. Scattered diverticular changes noted without definitive diverticulitis. No obstructive changes of the colon are seen. The appendix is within normal limits. No obstructive changes of the small bowel are noted. The stomach is decompressed. Vascular/Lymphatic: No significant vascular findings are present. No enlarged abdominal or pelvic lymph nodes. No definitive deep venous thrombosis is noted. Reproductive: Uterus is well visualized and within normal limits. Dominant right adnexal cyst is noted measuring 2.8 cm relatively stable from the prior exam. Other: There are multiple fluid collections identified throughout the abdomen. The largest of  these lies anteriorly in the left mid abdomen measuring 6.2 x 4.2 cm best seen on image number 45 of series 4. This is consistent with a postoperative abscess. A second large air-fluid collection measuring 4.8 cm in greatest dimension is noted on image number 44 of series 4. Smaller scattered multifocal air-fluid collections are noted throughout the abdomen particularly on the left. Fluid collections in the left lower quadrant adjacent to the sigmoid are noted relatively similar to that noted on the prior exam. There remains a considerable amount of free intraperitoneal air. This appears slightly more prominent than that seen on the prior exam. Musculoskeletal: No acute bony abnormality is noted. Review of the MIP images confirms the above findings. IMPRESSION: CTA of the chest: No evidence of pulmonary emboli. Bilateral pleural effusions increased from the prior exam. Bibasilar consolidation is noted which is relatively stable. CT of the abdomen and pelvis: Postoperative changes are noted. There is a considerable amount of free air within the abdomen as well as free fluid. The air appears somewhat greater than that noted on the prior exam and there are more fluid and air fluid collections identified throughout the abdomen. This raises suspicion for new leakage and likely developing abscesses. The largest of these is noted in the left mid abdomen as described above on image number 45. Surgical evaluation is recommended. Electronically Signed   By: Inez Catalina M.D.   On: 08/10/2019 19:12   CT ABDOMEN PELVIS W CONTRAST  Result Date: 08/10/2019 CLINICAL DATA:  Shortness of breath and tachycardia, peripheral leg swelling and abdominal pain, history of recent sigmoid surgery EXAM: CT ANGIOGRAPHY CHEST CT ABDOMEN AND PELVIS WITH CONTRAST TECHNIQUE: Multidetector CT imaging of the chest was performed using the standard protocol during bolus administration of intravenous contrast. Multiplanar CT image reconstructions  and MIPs were obtained to evaluate the vascular anatomy. Multidetector CT imaging of the abdomen and pelvis was performed using the standard protocol during bolus administration of intravenous contrast. CONTRAST:  129mL OMNIPAQUE IOHEXOL 350 MG/ML SOLN COMPARISON:  08/07/2019 FINDINGS: CTA CHEST FINDINGS Cardiovascular: Thoracic aorta and its branches are within normal limits. No aneurysmal dilatation or dissection is seen. The  heart is not significantly enlarged. No pericardial effusion is seen. No significant coronary calcifications are noted. The pulmonary artery shows no large central pulmonary embolus. The peripheral branches are somewhat limited by patient motion artifact. Mediastinum/Nodes: Thoracic inlet is within normal limits. Small mediastinal lymph nodes are noted likely reactive in nature. No sizable adenopathy is seen. The esophagus appears within normal limits. Lungs/Pleura: Small bilateral pleural effusions are noted. Bibasilar consolidation is seen which may be related to the postoperative state although superimposed pneumonia cannot be totally excluded. No pneumothorax is identified. No sizable parenchymal nodules are seen. Musculoskeletal: No acute bony abnormality is noted. Review of the MIP images confirms the above findings. CT ABDOMEN and PELVIS FINDINGS Hepatobiliary: No focal liver abnormality is seen. Status post cholecystectomy. No biliary dilatation. Pancreas: Unremarkable. No pancreatic ductal dilatation or surrounding inflammatory changes. Spleen: Normal in size without focal abnormality. Adrenals/Urinary Tract: Adrenal glands show a left adrenal hypodense lesion likely representing adenoma stable in appearance. The kidneys demonstrate a normal enhancement pattern bilaterally. No renal calculi or urinary tract obstructive changes are seen. Delayed images demonstrate normal excretion of contrast material. No obstructive changes are seen. The bladder is decompressed. Stomach/Bowel:  Postsurgical changes are noted in the sigmoid similar to that seen on prior examination. Scattered diverticular changes noted without definitive diverticulitis. No obstructive changes of the colon are seen. The appendix is within normal limits. No obstructive changes of the small bowel are noted. The stomach is decompressed. Vascular/Lymphatic: No significant vascular findings are present. No enlarged abdominal or pelvic lymph nodes. No definitive deep venous thrombosis is noted. Reproductive: Uterus is well visualized and within normal limits. Dominant right adnexal cyst is noted measuring 2.8 cm relatively stable from the prior exam. Other: There are multiple fluid collections identified throughout the abdomen. The largest of these lies anteriorly in the left mid abdomen measuring 6.2 x 4.2 cm best seen on image number 45 of series 4. This is consistent with a postoperative abscess. A second large air-fluid collection measuring 4.8 cm in greatest dimension is noted on image number 44 of series 4. Smaller scattered multifocal air-fluid collections are noted throughout the abdomen particularly on the left. Fluid collections in the left lower quadrant adjacent to the sigmoid are noted relatively similar to that noted on the prior exam. There remains a considerable amount of free intraperitoneal air. This appears slightly more prominent than that seen on the prior exam. Musculoskeletal: No acute bony abnormality is noted. Review of the MIP images confirms the above findings. IMPRESSION: CTA of the chest: No evidence of pulmonary emboli. Bilateral pleural effusions increased from the prior exam. Bibasilar consolidation is noted which is relatively stable. CT of the abdomen and pelvis: Postoperative changes are noted. There is a considerable amount of free air within the abdomen as well as free fluid. The air appears somewhat greater than that noted on the prior exam and there are more fluid and air fluid collections  identified throughout the abdomen. This raises suspicion for new leakage and likely developing abscesses. The largest of these is noted in the left mid abdomen as described above on image number 45. Surgical evaluation is recommended. Electronically Signed   By: Inez Catalina M.D.   On: 08/10/2019 19:12   CT ABDOMEN PELVIS W CONTRAST  Result Date: 08/07/2019 CLINICAL DATA:  Abdominal pain, fever. History of sigmoidectomy 3 days ago. EXAM: CT ABDOMEN AND PELVIS WITH CONTRAST TECHNIQUE: Multidetector CT imaging of the abdomen and pelvis was performed using the standard protocol  following bolus administration of intravenous contrast. CONTRAST:  110mL OMNIPAQUE IOHEXOL 300 MG/ML  SOLN COMPARISON:  CT abdomen pelvis dated 03/07/2019. FINDINGS: Lower chest: There is moderate bibasilar atelectasis. Hepatobiliary: No focal liver abnormality is seen. Status post cholecystectomy. No biliary dilatation. Pancreas: Unremarkable. No pancreatic ductal dilatation or surrounding inflammatory changes. Spleen: Normal in size without focal abnormality. Adrenals/Urinary Tract: A benign left adrenal adenoma is unchanged. Kidneys are normal, without renal calculi, focal lesion, or hydronephrosis. Bladder is decompressed. Stomach/Bowel: The patient is status post sigmoidectomy with colorectal anastomosis. There is moderate volume of free intraperitoneal fluid and free intraperitoneal air which is likely postoperative in nature. There is no evidence of bowel obstruction. Multiple more discrete areas of intraperitoneal fluid may represent developing abscesses. A fluid collection in the right lower quadrant measures 5.7 x 3.2 x 5.0 cm (series 2, image 78 and series 5, image 46). A fluid collection in the left lower quadrant measures 5.0 x 2.4 x 6.5 cm (series 2, image 82 and series 5, image 79). No fluid collection is seen adjacent to the anastomosis, however sensitivity to detect an anastomotic leak is limited given the lack of enteric  contrast in this portion of the bowel. Vascular/Lymphatic: No significant vascular findings are present. No enlarged abdominal or pelvic lymph nodes. Reproductive: Bilateral adnexal cysts measure up to 3.0 cm. The uterus is unremarkable. Other: No abdominal wall hernia or abnormality. Musculoskeletal: No acute or significant osseous findings. Degenerative changes are most significant at L3-4. IMPRESSION: 1. Moderate volume of free intraperitoneal fluid and free intraperitoneal air are likely postoperative in nature, however more discrete areas of intraperitoneal fluid may represent developing abscesses in the right lower and left lower quadrants. 2. No fluid collection is seen immediately adjacent to the anastomosis, however sensitivity to detect an anastomotic leak is limited given the lack of enteric contrast in this portion of the bowel. Electronically Signed   By: Zerita Boers M.D.   On: 08/07/2019 11:45   VAS [place] LOWER EXTREMITY VENOUS (DVT)  Result Date: 08/10/2019  Lower Venous DVTStudy Indications: Edema, and SOB.  Performing Technologist: June Leap RDMS, RVT  Examination Guidelines: A complete evaluation includes B-mode imaging, spectral Doppler, color Doppler, and power Doppler as needed of all accessible portions of each vessel. Bilateral testing is considered an integral part of a complete examination. Limited examinations for reoccurring indications may be performed as noted. The reflux portion of the exam is performed with the patient in reverse Trendelenburg.  +---------+---------------+---------+-----------+----------+--------------+ RIGHT    CompressibilityPhasicitySpontaneityPropertiesThrombus Aging +---------+---------------+---------+-----------+----------+--------------+ CFV      Full           Yes      Yes                                 +---------+---------------+---------+-----------+----------+--------------+ SFJ      Full                                                         +---------+---------------+---------+-----------+----------+--------------+ FV Prox  Full                                                        +---------+---------------+---------+-----------+----------+--------------+  FV Mid   Full                                                        +---------+---------------+---------+-----------+----------+--------------+ FV DistalFull                                                        +---------+---------------+---------+-----------+----------+--------------+ PFV      Full                                                        +---------+---------------+---------+-----------+----------+--------------+ POP      Full           Yes      Yes                                 +---------+---------------+---------+-----------+----------+--------------+ PTV      Full                                                        +---------+---------------+---------+-----------+----------+--------------+ PERO     Full                                                        +---------+---------------+---------+-----------+----------+--------------+   +---------+---------------+---------+-----------+----------+--------------+ LEFT     CompressibilityPhasicitySpontaneityPropertiesThrombus Aging +---------+---------------+---------+-----------+----------+--------------+ CFV      Full           Yes      Yes                                 +---------+---------------+---------+-----------+----------+--------------+ SFJ      Full                                                        +---------+---------------+---------+-----------+----------+--------------+ FV Prox  Full                                                        +---------+---------------+---------+-----------+----------+--------------+ FV Mid   Full                                                         +---------+---------------+---------+-----------+----------+--------------+  FV DistalFull                                                        +---------+---------------+---------+-----------+----------+--------------+ PFV      Full                                                        +---------+---------------+---------+-----------+----------+--------------+ POP      Full           Yes      Yes                                 +---------+---------------+---------+-----------+----------+--------------+ PTV      Full                                                        +---------+---------------+---------+-----------+----------+--------------+ PERO     Full                                                        +---------+---------------+---------+-----------+----------+--------------+     Summary: BILATERAL: - No evidence of deep vein thrombosis seen in the lower extremities, bilaterally. -No evidence of popliteal cyst, bilaterally.   *See table(s) above for measurements and observations. Electronically signed by Curt Jews MD on 08/10/2019 at 7:28:47 PM.    Final

## 2019-08-11 NOTE — Consult Note (Signed)
[place] Nurse requested for preoperative stoma site marking  Discussed surgical procedure and stoma creation with patient.  Explained role of the [place] nurse team. Provided the patient with educational booklet and provided samples of pouching options.  Answered patient's questions.   Examined patient lying and sitting upright in order to place the marking in the patient's visual field, away from any creases or abdominal contour issues and within the rectus muscle.  Attempted to mark below the patient's belt line, but this was not possible since a significant crease appears lower when she leans forward and should be avoided if possible.   Marked for colostomy in the LLQ  __6__ cm to the left of the umbilicus and [id number] above the umbilicus.  Marked for ileostomy in the RLQ  __6__cm to the right of the umbilicus and  123456 cm above the umbilicus.  Pt plans for surgery this afternoon; WOC will follow post-op for teaching sessions if patient recieves an ostomy. Julien Girt MSN, RN, [place], [place], [place]

## 2019-08-11 NOTE — Anesthesia Procedure Notes (Signed)
Procedure Name: Intubation Date/Time: 08/11/2019 1:26 PM Performed by: Eben Burow, CRNA Pre-anesthesia Checklist: Patient identified, Emergency Drugs available, Suction available, Patient being monitored and Timeout performed Patient Re-evaluated:Patient Re-evaluated prior to induction Oxygen Delivery Method: Circle system utilized Preoxygenation: Pre-oxygenation with 100% oxygen Induction Type: IV induction, Rapid sequence and Cricoid Pressure applied Laryngoscope Size: Mac and 4 Grade View: Grade I Tube type: Oral Tube size: 7.0 mm Number of attempts: 1 Airway Equipment and Method: Stylet Placement Confirmation: ETT inserted through vocal cords under direct vision,  positive ETCO2 and breath sounds checked- equal and bilateral Secured at: 21 cm Tube secured with: Tape Dental Injury: Teeth and Oropharynx as per pre-operative assessment

## 2019-08-11 NOTE — Transfer of Care (Signed)
Immediate Anesthesia Transfer of Care Note  Patient: Sandra Bonilla  Procedure(s) Performed: DIAGNOSTIC LAPAROSCOPY WITH [place] OUT, DRAIN PLACEMENT, AND LOOP ILEOSTOMY CREATION (N/A Abdomen)  Patient Location: PACU  Anesthesia Type:General  Level of Consciousness: awake, drowsy, patient cooperative and responds to stimulation  Airway & Oxygen Therapy: Patient Spontanous Breathing and Patient connected to face mask oxygen  Post-op Assessment: Report given to RN and Post -op Vital signs reviewed and stable  Post vital signs: Reviewed and stable  Last Vitals:  Vitals Value Taken Time  BP 120/62 08/11/19 1610  Temp    Pulse 101 08/11/19 1612  Resp 17 08/11/19 1612  SpO2 93 % 08/11/19 1612  Vitals shown include unvalidated device data.  Last Pain:  Vitals:   08/11/19 1151  TempSrc: Oral  PainSc:       Patients Stated Pain Goal: 3 ([id number])  Complications: No apparent anesthesia complications

## 2019-08-11 NOTE — Plan of Care (Signed)

## 2019-08-11 NOTE — Progress Notes (Signed)
Day of Surgery robotic sigmoidectomy Subjective: Pt worsened yesterday afternoon.  Has had increased tachycardia overnight at time.  Continues to have RUQ pain.  No fevers or nausea Objective: Vital signs in last 24 hours: Temp:  [97.6 F (36.4 C)-99.6 F (37.6 C)] 97.8 F (36.6 C) (05/13 0938) Pulse Rate:  [102-144] 118 (05/13 0938) Resp:  [16-20] 18 (05/13 0938) BP: (122-135)/(72-84) 133/77 (05/13 0938) SpO2:  [96 %-99 %] 98 % (05/13 0938) Weight:  [101.7 kg] 101.7 kg (05/13 0439)   Intake/Output from previous day: 05/12 0701 - 05/13 0700 In: 1446.1 [P.O.:720; I.V.:629.7; IV Piggyback:96.4] Out: -  Intake/Output this shift: No intake/output data recorded. General appearance: alert and cooperative GI: normal findings: soft, non-distended  Incision: no significant drainage  Lab Results:  Recent Labs    08/10/19 0252 08/11/19 0303  WBC 14.6* 20.5*  HGB 10.2* 10.4*  HCT 32.1* 32.1*  PLT 296 349   BMET Recent Labs    08/10/19 0252 08/11/19 0303  NA 139 136  K 3.5 3.2*  CL 107 102  CO2 22 21*  GLUCOSE 77 68*  BUN 10 7  CREATININE 0.56 0.58  CALCIUM 7.1* 7.6*   PT/INR No results for input(s): LABPROT, INR in the last 72 hours. ABG No results for input(s): PHART, HCO3 in the last 72 hours.  Invalid input(s): PCO2, PO2  MEDS, Scheduled . enoxaparin (LOVENOX) injection  40 mg Subcutaneous Q24H  . fluticasone furoate-vilanterol  1 puff Inhalation Daily   And  . umeclidinium bromide  1 puff Inhalation Daily  . gabapentin  300 mg Oral BID  . pantoprazole  40 mg Oral QHS    Studies/Results: CT ANGIO CHEST PE W OR WO CONTRAST  Result Date: 08/10/2019 CLINICAL DATA:  Shortness of breath and tachycardia, peripheral leg swelling and abdominal pain, history of recent sigmoid surgery EXAM: CT ANGIOGRAPHY CHEST CT ABDOMEN AND PELVIS WITH CONTRAST TECHNIQUE: Multidetector CT imaging of the chest was performed using the standard protocol during bolus administration of  intravenous contrast. Multiplanar CT image reconstructions and MIPs were obtained to evaluate the vascular anatomy. Multidetector CT imaging of the abdomen and pelvis was performed using the standard protocol during bolus administration of intravenous contrast. CONTRAST:  122mL OMNIPAQUE IOHEXOL 350 MG/ML SOLN COMPARISON:  08/07/2019 FINDINGS: CTA CHEST FINDINGS Cardiovascular: Thoracic aorta and its branches are within normal limits. No aneurysmal dilatation or dissection is seen. The heart is not significantly enlarged. No pericardial effusion is seen. No significant coronary calcifications are noted. The pulmonary artery shows no large central pulmonary embolus. The peripheral branches are somewhat limited by patient motion artifact. Mediastinum/Nodes: Thoracic inlet is within normal limits. Small mediastinal lymph nodes are noted likely reactive in nature. No sizable adenopathy is seen. The esophagus appears within normal limits. Lungs/Pleura: Small bilateral pleural effusions are noted. Bibasilar consolidation is seen which may be related to the postoperative state although superimposed pneumonia cannot be totally excluded. No pneumothorax is identified. No sizable parenchymal nodules are seen. Musculoskeletal: No acute bony abnormality is noted. Review of the MIP images confirms the above findings. CT ABDOMEN and PELVIS FINDINGS Hepatobiliary: No focal liver abnormality is seen. Status post cholecystectomy. No biliary dilatation. Pancreas: Unremarkable. No pancreatic ductal dilatation or surrounding inflammatory changes. Spleen: Normal in size without focal abnormality. Adrenals/Urinary Tract: Adrenal glands show a left adrenal hypodense lesion likely representing adenoma stable in appearance. The kidneys demonstrate a normal enhancement pattern bilaterally. No renal calculi or urinary tract obstructive changes are seen. Delayed images demonstrate normal  excretion of contrast material. No obstructive changes  are seen. The bladder is decompressed. Stomach/Bowel: Postsurgical changes are noted in the sigmoid similar to that seen on prior examination. Scattered diverticular changes noted without definitive diverticulitis. No obstructive changes of the colon are seen. The appendix is within normal limits. No obstructive changes of the small bowel are noted. The stomach is decompressed. Vascular/Lymphatic: No significant vascular findings are present. No enlarged abdominal or pelvic lymph nodes. No definitive deep venous thrombosis is noted. Reproductive: Uterus is well visualized and within normal limits. Dominant right adnexal cyst is noted measuring 2.8 cm relatively stable from the prior exam. Other: There are multiple fluid collections identified throughout the abdomen. The largest of these lies anteriorly in the left mid abdomen measuring 6.2 x 4.2 cm best seen on image number 45 of series 4. This is consistent with a postoperative abscess. A second large air-fluid collection measuring 4.8 cm in greatest dimension is noted on image number 44 of series 4. Smaller scattered multifocal air-fluid collections are noted throughout the abdomen particularly on the left. Fluid collections in the left lower quadrant adjacent to the sigmoid are noted relatively similar to that noted on the prior exam. There remains a considerable amount of free intraperitoneal air. This appears slightly more prominent than that seen on the prior exam. Musculoskeletal: No acute bony abnormality is noted. Review of the MIP images confirms the above findings. IMPRESSION: CTA of the chest: No evidence of pulmonary emboli. Bilateral pleural effusions increased from the prior exam. Bibasilar consolidation is noted which is relatively stable. CT of the abdomen and pelvis: Postoperative changes are noted. There is a considerable amount of free air within the abdomen as well as free fluid. The air appears somewhat greater than that noted on the prior exam  and there are more fluid and air fluid collections identified throughout the abdomen. This raises suspicion for new leakage and likely developing abscesses. The largest of these is noted in the left mid abdomen as described above on image number 45. Surgical evaluation is recommended. Electronically Signed   By: Inez Catalina M.D.   On: 08/10/2019 19:12   CT ABDOMEN PELVIS W CONTRAST  Result Date: 08/10/2019 CLINICAL DATA:  Shortness of breath and tachycardia, peripheral leg swelling and abdominal pain, history of recent sigmoid surgery EXAM: CT ANGIOGRAPHY CHEST CT ABDOMEN AND PELVIS WITH CONTRAST TECHNIQUE: Multidetector CT imaging of the chest was performed using the standard protocol during bolus administration of intravenous contrast. Multiplanar CT image reconstructions and MIPs were obtained to evaluate the vascular anatomy. Multidetector CT imaging of the abdomen and pelvis was performed using the standard protocol during bolus administration of intravenous contrast. CONTRAST:  183mL OMNIPAQUE IOHEXOL 350 MG/ML SOLN COMPARISON:  08/07/2019 FINDINGS: CTA CHEST FINDINGS Cardiovascular: Thoracic aorta and its branches are within normal limits. No aneurysmal dilatation or dissection is seen. The heart is not significantly enlarged. No pericardial effusion is seen. No significant coronary calcifications are noted. The pulmonary artery shows no large central pulmonary embolus. The peripheral branches are somewhat limited by patient motion artifact. Mediastinum/Nodes: Thoracic inlet is within normal limits. Small mediastinal lymph nodes are noted likely reactive in nature. No sizable adenopathy is seen. The esophagus appears within normal limits. Lungs/Pleura: Small bilateral pleural effusions are noted. Bibasilar consolidation is seen which may be related to the postoperative state although superimposed pneumonia cannot be totally excluded. No pneumothorax is identified. No sizable parenchymal nodules are seen.  Musculoskeletal: No acute bony abnormality is noted. Review  of the MIP images confirms the above findings. CT ABDOMEN and PELVIS FINDINGS Hepatobiliary: No focal liver abnormality is seen. Status post cholecystectomy. No biliary dilatation. Pancreas: Unremarkable. No pancreatic ductal dilatation or surrounding inflammatory changes. Spleen: Normal in size without focal abnormality. Adrenals/Urinary Tract: Adrenal glands show a left adrenal hypodense lesion likely representing adenoma stable in appearance. The kidneys demonstrate a normal enhancement pattern bilaterally. No renal calculi or urinary tract obstructive changes are seen. Delayed images demonstrate normal excretion of contrast material. No obstructive changes are seen. The bladder is decompressed. Stomach/Bowel: Postsurgical changes are noted in the sigmoid similar to that seen on prior examination. Scattered diverticular changes noted without definitive diverticulitis. No obstructive changes of the colon are seen. The appendix is within normal limits. No obstructive changes of the small bowel are noted. The stomach is decompressed. Vascular/Lymphatic: No significant vascular findings are present. No enlarged abdominal or pelvic lymph nodes. No definitive deep venous thrombosis is noted. Reproductive: Uterus is well visualized and within normal limits. Dominant right adnexal cyst is noted measuring 2.8 cm relatively stable from the prior exam. Other: There are multiple fluid collections identified throughout the abdomen. The largest of these lies anteriorly in the left mid abdomen measuring 6.2 x 4.2 cm best seen on image number 45 of series 4. This is consistent with a postoperative abscess. A second large air-fluid collection measuring 4.8 cm in greatest dimension is noted on image number 44 of series 4. Smaller scattered multifocal air-fluid collections are noted throughout the abdomen particularly on the left. Fluid collections in the left lower  quadrant adjacent to the sigmoid are noted relatively similar to that noted on the prior exam. There remains a considerable amount of free intraperitoneal air. This appears slightly more prominent than that seen on the prior exam. Musculoskeletal: No acute bony abnormality is noted. Review of the MIP images confirms the above findings. IMPRESSION: CTA of the chest: No evidence of pulmonary emboli. Bilateral pleural effusions increased from the prior exam. Bibasilar consolidation is noted which is relatively stable. CT of the abdomen and pelvis: Postoperative changes are noted. There is a considerable amount of free air within the abdomen as well as free fluid. The air appears somewhat greater than that noted on the prior exam and there are more fluid and air fluid collections identified throughout the abdomen. This raises suspicion for new leakage and likely developing abscesses. The largest of these is noted in the left mid abdomen as described above on image number 45. Surgical evaluation is recommended. Electronically Signed   By: Inez Catalina M.D.   On: 08/10/2019 19:12   VAS [place] LOWER EXTREMITY VENOUS (DVT)  Result Date: 08/10/2019  Lower Venous DVTStudy Indications: Edema, and SOB.  Performing Technologist: June Leap RDMS, RVT  Examination Guidelines: A complete evaluation includes B-mode imaging, spectral Doppler, color Doppler, and power Doppler as needed of all accessible portions of each vessel. Bilateral testing is considered an integral part of a complete examination. Limited examinations for reoccurring indications may be performed as noted. The reflux portion of the exam is performed with the patient in reverse Trendelenburg.  +---------+---------------+---------+-----------+----------+--------------+ RIGHT    CompressibilityPhasicitySpontaneityPropertiesThrombus Aging +---------+---------------+---------+-----------+----------+--------------+ CFV      Full           Yes      Yes                                  +---------+---------------+---------+-----------+----------+--------------+  SFJ      Full                                                        +---------+---------------+---------+-----------+----------+--------------+ FV Prox  Full                                                        +---------+---------------+---------+-----------+----------+--------------+ FV Mid   Full                                                        +---------+---------------+---------+-----------+----------+--------------+ FV DistalFull                                                        +---------+---------------+---------+-----------+----------+--------------+ PFV      Full                                                        +---------+---------------+---------+-----------+----------+--------------+ POP      Full           Yes      Yes                                 +---------+---------------+---------+-----------+----------+--------------+ PTV      Full                                                        +---------+---------------+---------+-----------+----------+--------------+ PERO     Full                                                        +---------+---------------+---------+-----------+----------+--------------+   +---------+---------------+---------+-----------+----------+--------------+ LEFT     CompressibilityPhasicitySpontaneityPropertiesThrombus Aging +---------+---------------+---------+-----------+----------+--------------+ CFV      Full           Yes      Yes                                 +---------+---------------+---------+-----------+----------+--------------+ SFJ      Full                                                        +---------+---------------+---------+-----------+----------+--------------+  FV Prox  Full                                                         +---------+---------------+---------+-----------+----------+--------------+ FV Mid   Full                                                        +---------+---------------+---------+-----------+----------+--------------+ FV DistalFull                                                        +---------+---------------+---------+-----------+----------+--------------+ PFV      Full                                                        +---------+---------------+---------+-----------+----------+--------------+ POP      Full           Yes      Yes                                 +---------+---------------+---------+-----------+----------+--------------+ PTV      Full                                                        +---------+---------------+---------+-----------+----------+--------------+ PERO     Full                                                        +---------+---------------+---------+-----------+----------+--------------+     Summary: BILATERAL: - No evidence of deep vein thrombosis seen in the lower extremities, bilaterally. -No evidence of popliteal cyst, bilaterally.   *See table(s) above for measurements and observations. Electronically signed by Curt Jews MD on 08/10/2019 at 7:28:47 PM.    Final     Assessment: s/p Procedure(s): XI ROBOT ASSISTED SIGMOIDECTOMY FIREFLY INJECTION Patient Active Problem List   Diagnosis Date Noted  . Diverticular disease 08/04/2019  . 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 Absolute anemia 06/25/2019  . Change in bowel habits 06/25/2019  . IUD threads lost 01/26/2019  . History of abnormal cervical Pap smear 01/25/2019  . Hx of diverticulitis of colon 12/24/2018  . Dyspepsia 12/24/2018  . Non-intractable vomiting 12/24/2018  . Abnormal CT scan, pelvis 12/24/2018  . Urinary dysfunction 12/30/2017  . Varicose veins of bilateral lower extremities with pain 10/28/2017  . Low grade squamous intraepithelial  lesion (LGSIL) on cervical Pap smear 10/29/2016  . 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 Head injury 06/25/2016  . Essential hypertension 04/22/2016  . Diverticulitis of large intestine with complication   . Multiple sclerosis ([place])   . Diverticulitis 04/21/2016  . Other headache syndrome 02/05/2016  . Vitamin D deficiency 10/09/2015  . Other fatigue 10/09/2015  . Dysesthesia 04/11/2015  . Depression with anxiety 04/11/2015  . Gait disturbance 12/13/2014  . Insomnia 12/13/2014  . Multiple sclerosis, relapsing-remitting ([place]) 08/30/2012  . Relapsing remitting multiple sclerosis ([place]) 08/30/2012    Post op ileus, resolving  Plan: Wbc up today sharply CT scan shows increased free air around anastomosis.  I'm concerned she has developed an anastomotic leak.  I have recommended a diagnostic laparoscopy and washout with drain placement.  If an anastomotic leak is found, we will plan on diverting ileostomy.  If small bowel injury is noted, this will be resected.  I have discussed this in detail with the patient, including the probable temporary nature of the ileostomy.  We discussed the risk for injuring other structures is higher with redo surgeries.  We discussed risk of bleeding and additional infection as well.  All questions were answered.   LOS: 7 days     .Rosario Adie, MD [facility], [place]    08/11/2019 11:10 AM

## 2019-08-11 NOTE — Op Note (Signed)
08/04/2019 - 08/11/2019  3:52 PM  PATIENT:  Sandra Bonilla  44 y.o. female  Patient Care Team: [name], MD as PCP - General (Internal Medicine) [name], MD as Consulting Physician (Colon and Rectal Surgery)  PRE-OPERATIVE DIAGNOSIS:  Anastomotic leak   POST-OPERATIVE DIAGNOSIS:  Anastomotic leak  PROCEDURE:  DIAGNOSTIC LAPAROSCOPY WITH [place] OUT, DRAIN PLACEMENT, AND ILEOSTOMY CREATION    Surgeon(s): Michael Boston, MD [name], MD  ASSISTANT: Dr Johney Maine   ANESTHESIA:   local and general  EBL: 33ml Total I/O In: 2000 [I.V.:2000] Out: 220 [Urine:150; Blood:70]  DRAINS: (23 F ) Jackson-Pratt drain(s) with closed bulb suction in the pelvis   SPECIMEN:  No Specimen  DISPOSITION OF SPECIMEN:  N/A  COUNTS:  YES  PLAN OF CARE: Pt already admitted  PATIENT DISPOSITION:  PACU - hemodynamically stable.  INDICATION: 44 year old female approximately 1 week status post robotic assisted sigmoidectomy.  Patient developed increasing tachycardia and abdominal pain as well as shortness of breath.  CT scan was obtained which showed signs concerning for anastomotic leak.  White blood cell count became elevated as well.  I recommended a diagnostic laparoscopy with probable ileostomy creation and drain placement.  Patient was understanding of this and wishes to proceed with the surgery.   OR FINDINGS: Patient appeared to have a posterior anastomotic leak with stool and purulence noted within the pelvis.  The leak tract between her retroperitoneum and small bowel up to the right upper quadrant.  DESCRIPTION: the patient was identified in the preoperative holding area and taken to the OR where they were laid supine on the operating room table.  General anesthesia was induced without difficulty. SCDs were also noted to be in place prior to the initiation of anesthesia.  The patient was then prepped and draped in the usual sterile fashion.   A surgical timeout was performed  indicating the correct patient, procedure, positioning and need for preoperative antibiotics.   I began by using a varies needle in the left upper quadrant to obtain access into the abdomen.  The abdomen was then insufflated.  I placed a port through the previous left upper quadrant site using laparoscopic entry technique.  I evaluated the varies needle placement.  There was no sign of injury noted.  I then placed additional 5 mm laparoscopic ports through the previous port sites under direct visualization.  The omentum was bluntly taken down from the abdominal wall.  I followed the omentum down into the pelvis.  The omentum was gently retracted away from the pelvic structures.  There was obvious purulence and a small amount of stool within the pelvis.  This was irrigated copiously.  The colon appeared perfused and intact overall.  I was not able to visualize anything more than the anterior staple line due to significant inflammatory changes.  I then turned my attention to the terminal ileum.  We identified the cecum.  I then gently mobilized the terminal ileum bluntly to allow for a loop ileostomy to be created.  I made a defect in the skin at the level of the umbilicus as the previously marked ostomy site was unable to be reached by the terminal ileum.  We identified the proximal and distal limbs.  I then enlarged the right lower quadrant port site and divided the fascia at this level using a cruciate incision.  The rectus was divided bluntly and the peritoneum was entered under direct laparoscopic visualization.  An [place] wound protector was placed.  The small  bowel was gently pulled through the wound protector and oriented proximal and distal.  We confirmed this using laparoscopy.  I placed a red rubber loop catheter at the ileostomy site.  This was then placed back into the pelvis and the abdomen was irrigated with approximately 6 L of normal saline.  We irrigated all 4 quadrants as well as the pelvis.   Hemostasis was reasonable given her inflammatory state.  I placed a 71 [place] Blake drain in the pelvis at the site that appeared to be the anastomotic leak.  This was brought out through the right lower quadrant port site and secured into place with a 2-0 nylon suture.  We then desufflated the abdomen and brought up the loop ileostomy through this [place] wound protector.  The wound protector was then removed and the ileostomy was secured into place using 2-0 Vicryl sutures.  The abdomen was desufflated.  The remaining port sites were closed using an interrupted 4-0 Vicryl suture and Dermabond.  The ileostomy was then matured in standard [place] fashion using interrupted 2-0 Vicryl sutures.  An ostomy appliance was placed.  A drain sponge was placed.  The patient was then awakened from anesthesia and sent to the postanesthesia care unit in stable condition.  All counts were correct per operating room staff.

## 2019-08-11 NOTE — Anesthesia Preprocedure Evaluation (Signed)
Anesthesia Evaluation  Patient identified by MRN, date of birth, ID band Patient awake    Reviewed: Allergy & Precautions, NPO status , Patient's Chart, lab work & pertinent test results  Airway Mallampati: I       Dental no notable dental hx. (+) Teeth Intact   Pulmonary asthma , former smoker,    Pulmonary exam normal breath sounds clear to auscultation       Cardiovascular hypertension, Pt. on medications Normal cardiovascular exam Rhythm:Regular Rate:Normal     Neuro/Psych  Headaches, PSYCHIATRIC DISORDERS Anxiety Depression MS    GI/Hepatic Neg liver ROS, GERD  Medicated,diverticulitis   Endo/Other  negative endocrine ROS  Renal/GU negative Renal ROS     Musculoskeletal   Abdominal (+) + obese,   Peds  Hematology  (+) anemia ,   Anesthesia Other Findings  S/p robotic sigmoidectomy on 5/6, now with suspected anastomotic leak, to return to OR for laparoscopy  Reproductive/Obstetrics                             Anesthesia Physical  Anesthesia Plan  ASA: III and emergent  Anesthesia Plan: General   Post-op Pain Management:    Induction: Intravenous, Rapid sequence and Cricoid pressure planned  PONV Risk Score and Plan: 4 or greater and Ondansetron, Dexamethasone, Midazolam and Treatment may vary due to age or medical condition  Airway Management Planned: Oral ETT  Additional Equipment: None  Intra-op Plan:   Post-operative Plan: Extubation in OR  Informed Consent: I have reviewed the patients History and Physical, chart, labs and discussed the procedure including the risks, benefits and alternatives for the proposed anesthesia with the patient or authorized representative who has indicated his/her understanding and acceptance.     Dental advisory given  Plan Discussed with:   Anesthesia Plan Comments:         Anesthesia Quick Evaluation

## 2019-08-12 ENCOUNTER — Inpatient Hospital Stay: Payer: Self-pay

## 2019-08-12 ENCOUNTER — Encounter: Payer: Self-pay | Admitting: *Deleted

## 2019-08-12 LAB — CBC
HCT: 29.9 % — ABNORMAL LOW (ref 36.0–46.0)
Hemoglobin: 9.4 g/dL — ABNORMAL LOW (ref 12.0–15.0)
MCH: 26.8 pg (ref 26.0–34.0)
MCHC: 31.4 g/dL (ref 30.0–36.0)
MCV: 85.2 fL (ref 80.0–100.0)
Platelets: 390 10*3/uL (ref 150–400)
RBC: 3.51 MIL/uL — ABNORMAL LOW (ref 3.87–5.11)
RDW: 17.3 % — ABNORMAL HIGH (ref 11.5–15.5)
WBC: 21.7 10*3/uL — ABNORMAL HIGH (ref 4.0–10.5)
nRBC: 0 % (ref 0.0–0.2)

## 2019-08-12 LAB — BASIC METABOLIC PANEL
Anion gap: 13 (ref 5–15)
BUN: 8 mg/dL (ref 6–20)
CO2: 20 mmol/L — ABNORMAL LOW (ref 22–32)
Calcium: 7.3 mg/dL — ABNORMAL LOW (ref 8.9–10.3)
Chloride: 102 mmol/L (ref 98–111)
Creatinine, Ser: 0.54 mg/dL (ref 0.44–1.00)
GFR calc Af Amer: >60 mL/min (ref >60)
GFR calc non Af Amer: >60 mL/min (ref >60)
Glucose, Bld: 100 mg/dL — ABNORMAL HIGH (ref 70–99)
Potassium: 3.8 mmol/L (ref 3.5–5.1)
Sodium: 135 mmol/L (ref 135–145)

## 2019-08-12 LAB — GLUCOSE, CAPILLARY
Glucose-Capillary: 112 mg/dL — ABNORMAL HIGH (ref 70–99)
Glucose-Capillary: 98 mg/dL (ref 70–99)

## 2019-08-12 MED ORDER — TRAVASOL 10 % IV SOLN
INTRAVENOUS | Status: AC
  Filled 2019-08-12: qty 480

## 2019-08-12 MED ORDER — PHENOL 1.4 % MT LIQD
2.0000 | OROMUCOSAL | Status: DC | PRN
  Filled 2019-08-12: qty 177

## 2019-08-12 MED ORDER — INSULIN ASPART 100 UNIT/ML ~~LOC~~ SOLN
0.0000 [IU] | SUBCUTANEOUS | Status: DC
  Administered 2019-08-13 – 2019-08-16 (×7): 1 [IU] via SUBCUTANEOUS

## 2019-08-12 MED ORDER — SODIUM CHLORIDE 0.9 % IV SOLN: INTRAVENOUS | Status: DC | PRN

## 2019-08-12 MED ORDER — SODIUM CHLORIDE 0.9% FLUSH
10.0000 mL | Freq: Two times a day (BID) | INTRAVENOUS | Status: DC
  Administered 2019-08-12 – 2019-08-13 (×2): 20 mL
  Administered 2019-08-15 – 2019-08-24 (×4): 10 mL
  Administered 2019-08-24: 20 mL
  Administered 2019-08-28: 10 mL

## 2019-08-12 MED ORDER — SODIUM CHLORIDE 0.9% FLUSH
10.0000 mL | INTRAVENOUS | Status: DC | PRN
  Administered 2019-08-18 – 2019-08-25 (×4): 10 mL

## 2019-08-12 NOTE — Consult Note (Signed)
[facility] Nurse ostomy follow up Pt had ileostomy surgery performed yesterday.  Demonstrated pouch change procedure; current pouch is leaking behind the barrier.  Stoma type/location: Stoma is red and viable, 1 3/4 inches, above skin level, small amt bloody drainage leaking around the base of the stoma, red rubber rod in place Peristomal assessment: intact skin surrounding Output: no stool or flatus at this time Ostomy pouching: Applied barrier ring to attempt to maintain a seal and 2 piece pouch Education provided: Pt watched the procedure using a hand held mirror and asked appropriate questions.  She was able to open and close to empty.  Discussed pouching routines and ordering supplies.  4 sets of wafers/pouches/barrier rings left in the room and educational materials at the bedside.  Enrolled patient in [facility] Start Discharge program: Yes [place] team will continue teaching sessions on Mon. Julien Girt MSN, RN, [place], [place], [place]

## 2019-08-12 NOTE — Progress Notes (Signed)
Tried to assist pt to ambulate this morning but unable to do so d/t complaining of severe pain and heart rate went up to 120-125bpm. Pt was able to stood up at bedside for few minutes and sit at the edge of the bed for 62mins. Pt assisted back to bed. PRN pain meds given per pt request. Will continue to monitor pt.

## 2019-08-12 NOTE — Progress Notes (Addendum)
Initial Nutrition Assessment  DOCUMENTATION CODES:   Obesity unspecified  INTERVENTION:  - TPN initiation and advancement per Pharmacist. - diet advancement as medically feasible.  NUTRITION DIAGNOSIS:   Inadequate oral intake related to inability to eat as evidenced by NPO status.  GOAL:   Patient will meet greater than or equal to 90% of their needs  MONITOR:   Diet advancement, Labs, Weight trends, I & O's, Other (Comment)(TPN regimen)  REASON FOR ASSESSMENT:   Consult New TPN/TNA  ASSESSMENT:   44 year old female with a history of MS, diverticulosis, diverticulitis, HTN, fibroadenoma of the R breast, anxiety, depression, asthma, and anemia. Patient presented to the hospital due to irregular BMs and worsening abdominal pain.  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   Since admission, diet has changed frequently from NPO, CLD, FLD, and to Soft. The only recorded intake was on 5/7 when she consumed 25% of breakfast (65 kcal, 3 grams protein) on Soft diet.   On 5/10 at 0935, diet advanced to CLD but patient did not consume any meals, only partial cups of liquids from floor stock. On 5/12 at 0911 diet was advanced to FLD and patient did not consume anything other than clears from the floor until she was made NPO yesterday at 0902.   NGT placed 5/13 at 1330 with 250 ml documented output yesterday. She has had 50 ml output this shift.   Per chart review, weight on 5/6 was 202 lb and weight today is 227 lb. Current weight is the highest weight recorded in >1 year.   She is still having pain/tenderness to surgical site, but it has been improving throughout the day. No sharp or stabbing type pains, more like cramping. No nausea today.   Plan  is for PICC and TPN initiation d/t anticipation of prolonged post-op ileus.    Labs reviewed; Ca: 7.3 mg/dl. Medications reviewed; sliding scale novolog, 40 mg oral protonix/day.  IVF; NS @ 100 to decrease to 60 ml/hr at 1800.    NUTRITION - FOCUSED PHYSICAL EXAM:  completed; no muscle or fat wasting, mild edema noted to BLE.   Diet Order:   Diet Order            Diet NPO time specified Except for: Ice Chips, Sips with Meds  Diet effective midnight              EDUCATION NEEDS:   Not appropriate for education at this time  Skin:  Skin Assessment: Reviewed RN Assessment  Last BM:  5/14 (50 ml via colostomy)  Height:   Ht Readings from Last 1 Encounters:  08/11/19 5\' 6"  (1.676 m)    Weight:   Wt Readings from Last 1 Encounters:  08/12/19 106.3 kg    Estimated Nutritional Needs:  Kcal:  2200-2400 kcal Protein:  115-130 grams Fluid:  >/= 2.5 L/day     Jarome Matin, MS, RD, LDN, CNSC Inpatient Clinical Dietitian RD pager # available in [facility]  After hours/weekend pager # available in [facility]

## 2019-08-12 NOTE — Progress Notes (Signed)
PHARMACY - TOTAL PARENTERAL NUTRITION CONSULT NOTE   Indication: Prolonged ileus  Patient Measurements: Height: 5\' 6"  (167.6 cm) Weight: 102.9 kg (226 lb 13.7 oz) IBW/kg (Calculated) : 59.3 TPN AdjBW (KG): 69.9 Body mass index is 36.62 kg/m. Usual Weight: 102 kg  Assessment:  Patient admitted on 08/04/19 for diverticulitis with microperforation and robotic sigmoidectomy performed.  Anastomotic leak developed and laparoscopy with wash out, drain placement, and ileostomy creation performed on 08/11/19.  Today, TPN consult requested due to anticipation of prolonged ileus.  Glucose / Insulin: BGs controlled, not on insulin Electrolytes: WNL Renal: Scr <1 LFTs / TGs: No recent, will order Prealbumin / albumin: No recent, will order Intake / Output; MIVF: NS 136ml/hr GI Imaging: 5/12 CT abd pelvis: suspicion for new leakage and likely developing abscesses Surgeries / Procedures: 08/04/19 Robotic Sigmoidectomy, 08/11/19 Lap wash out, drain placement, ileostomy creation  Central access: PICC ordered TPN start date: 08/12/19  Nutritional Goals (per RD recommendation on 5/15-pending): kCal: 1750-1950, Protein: 91-112, Fluid: ~ 2 L/day Goal TPN rate is 80 mL/hr (provides  96 g of protein and 1842 kcals per day)  Current Nutrition:  NPO  Plan:  Start TPN at 40 mL/hr at 1800 Electrolytes in TPN: 65mEq/L of Na, 81mEq/L of K, 62mEq/L of Ca, 42mEq/L of Mg, and 54mmol/L of Phos. Cl:Ac ratio 1:1 Add standard MVI and trace elements to TPN Initiate Sensitive q4h SSI and adjust as needed  Reduce MIVF to 60 mL/hr at 1800 Monitor TPN labs on Mon/Thurs  Efraim Kaufmann, PharmD, BCPS 08/12/2019,9:23 AM

## 2019-08-12 NOTE — Progress Notes (Signed)
Ileostomy output turned from a pinkish color to brown. Output of 100 ml.

## 2019-08-12 NOTE — Progress Notes (Signed)
1 Day Post-Op robotic sigmoidectomy Subjective: Pt feels a bit better.  Having less constant pain. Objective: Vital signs in last 24 hours: Temp:  [97.8 F (36.6 C)-98.8 F (37.1 C)] 97.8 F (36.6 C) (05/14 0522) Pulse Rate:  [96-124] 98 (05/14 0522) Resp:  [14-18] 14 (05/14 0522) BP: (115-137)/(62-82) 117/75 (05/14 0522) SpO2:  [94 %-100 %] 97 % (05/14 0522) Weight:  [101.7 kg-102.9 kg] 102.9 kg (05/14 0351)   Intake/Output from previous day: 05/13 0701 - 05/14 0700 In: 5039.8 [P.O.:120; I.V.:4167.1; IV Piggyback:752.7] Out: 1770 [Urine:1050; Emesis/NG output:350; Drains:250; Stool:50; Blood:70] Intake/Output this shift: No intake/output data recorded. General appearance: alert and cooperative GI: normal findings: soft, non-distended JP: SS fluid Ostomy: beefy red Incision: no significant drainage  Lab Results:  Recent Labs    08/11/19 0303 08/12/19 0319  WBC 20.5* 21.7*  HGB 10.4* 9.4*  HCT 32.1* 29.9*  PLT 349 390   BMET Recent Labs    08/11/19 0303 08/12/19 0319  NA 136 135  K 3.2* 3.8  CL 102 102  CO2 21* 20*  GLUCOSE 68* 100*  BUN 7 8  CREATININE 0.58 0.54  CALCIUM 7.6* 7.3*   PT/INR No results for input(s): LABPROT, INR in the last 72 hours. ABG No results for input(s): PHART, HCO3 in the last 72 hours.  Invalid input(s): PCO2, PO2  MEDS, Scheduled . Chlorhexidine Gluconate Cloth  6 each Topical Daily  . enoxaparin (LOVENOX) injection  40 mg Subcutaneous Q24H  . fluticasone furoate-vilanterol  1 puff Inhalation Daily   And  . umeclidinium bromide  1 puff Inhalation Daily  . gabapentin  300 mg Oral BID  . pantoprazole  40 mg Oral QHS    Studies/Results: CT ANGIO CHEST PE W OR WO CONTRAST  Result Date: 08/10/2019 CLINICAL DATA:  Shortness of breath and tachycardia, peripheral leg swelling and abdominal pain, history of recent sigmoid surgery EXAM: CT ANGIOGRAPHY CHEST CT ABDOMEN AND PELVIS WITH CONTRAST TECHNIQUE: Multidetector CT imaging  of the chest was performed using the standard protocol during bolus administration of intravenous contrast. Multiplanar CT image reconstructions and MIPs were obtained to evaluate the vascular anatomy. Multidetector CT imaging of the abdomen and pelvis was performed using the standard protocol during bolus administration of intravenous contrast. CONTRAST:  166mL OMNIPAQUE IOHEXOL 350 MG/ML SOLN COMPARISON:  08/07/2019 FINDINGS: CTA CHEST FINDINGS Cardiovascular: Thoracic aorta and its branches are within normal limits. No aneurysmal dilatation or dissection is seen. The heart is not significantly enlarged. No pericardial effusion is seen. No significant coronary calcifications are noted. The pulmonary artery shows no large central pulmonary embolus. The peripheral branches are somewhat limited by patient motion artifact. Mediastinum/Nodes: Thoracic inlet is within normal limits. Small mediastinal lymph nodes are noted likely reactive in nature. No sizable adenopathy is seen. The esophagus appears within normal limits. Lungs/Pleura: Small bilateral pleural effusions are noted. Bibasilar consolidation is seen which may be related to the postoperative state although superimposed pneumonia cannot be totally excluded. No pneumothorax is identified. No sizable parenchymal nodules are seen. Musculoskeletal: No acute bony abnormality is noted. Review of the MIP images confirms the above findings. CT ABDOMEN and PELVIS FINDINGS Hepatobiliary: No focal liver abnormality is seen. Status post cholecystectomy. No biliary dilatation. Pancreas: Unremarkable. No pancreatic ductal dilatation or surrounding inflammatory changes. Spleen: Normal in size without focal abnormality. Adrenals/Urinary Tract: Adrenal glands show a left adrenal hypodense lesion likely representing adenoma stable in appearance. The kidneys demonstrate a normal enhancement pattern bilaterally. No renal calculi or urinary tract  obstructive changes are seen.  Delayed images demonstrate normal excretion of contrast material. No obstructive changes are seen. The bladder is decompressed. Stomach/Bowel: Postsurgical changes are noted in the sigmoid similar to that seen on prior examination. Scattered diverticular changes noted without definitive diverticulitis. No obstructive changes of the colon are seen. The appendix is within normal limits. No obstructive changes of the small bowel are noted. The stomach is decompressed. Vascular/Lymphatic: No significant vascular findings are present. No enlarged abdominal or pelvic lymph nodes. No definitive deep venous thrombosis is noted. Reproductive: Uterus is well visualized and within normal limits. Dominant right adnexal cyst is noted measuring 2.8 cm relatively stable from the prior exam. Other: There are multiple fluid collections identified throughout the abdomen. The largest of these lies anteriorly in the left mid abdomen measuring 6.2 x 4.2 cm best seen on image number 45 of series 4. This is consistent with a postoperative abscess. A second large air-fluid collection measuring 4.8 cm in greatest dimension is noted on image number 44 of series 4. Smaller scattered multifocal air-fluid collections are noted throughout the abdomen particularly on the left. Fluid collections in the left lower quadrant adjacent to the sigmoid are noted relatively similar to that noted on the prior exam. There remains a considerable amount of free intraperitoneal air. This appears slightly more prominent than that seen on the prior exam. Musculoskeletal: No acute bony abnormality is noted. Review of the MIP images confirms the above findings. IMPRESSION: CTA of the chest: No evidence of pulmonary emboli. Bilateral pleural effusions increased from the prior exam. Bibasilar consolidation is noted which is relatively stable. CT of the abdomen and pelvis: Postoperative changes are noted. There is a considerable amount of free air within the abdomen  as well as free fluid. The air appears somewhat greater than that noted on the prior exam and there are more fluid and air fluid collections identified throughout the abdomen. This raises suspicion for new leakage and likely developing abscesses. The largest of these is noted in the left mid abdomen as described above on image number 45. Surgical evaluation is recommended. Electronically Signed   By: Inez Catalina M.D.   On: 08/10/2019 19:12   CT ABDOMEN PELVIS W CONTRAST  Result Date: 08/10/2019 CLINICAL DATA:  Shortness of breath and tachycardia, peripheral leg swelling and abdominal pain, history of recent sigmoid surgery EXAM: CT ANGIOGRAPHY CHEST CT ABDOMEN AND PELVIS WITH CONTRAST TECHNIQUE: Multidetector CT imaging of the chest was performed using the standard protocol during bolus administration of intravenous contrast. Multiplanar CT image reconstructions and MIPs were obtained to evaluate the vascular anatomy. Multidetector CT imaging of the abdomen and pelvis was performed using the standard protocol during bolus administration of intravenous contrast. CONTRAST:  11mL OMNIPAQUE IOHEXOL 350 MG/ML SOLN COMPARISON:  08/07/2019 FINDINGS: CTA CHEST FINDINGS Cardiovascular: Thoracic aorta and its branches are within normal limits. No aneurysmal dilatation or dissection is seen. The heart is not significantly enlarged. No pericardial effusion is seen. No significant coronary calcifications are noted. The pulmonary artery shows no large central pulmonary embolus. The peripheral branches are somewhat limited by patient motion artifact. Mediastinum/Nodes: Thoracic inlet is within normal limits. Small mediastinal lymph nodes are noted likely reactive in nature. No sizable adenopathy is seen. The esophagus appears within normal limits. Lungs/Pleura: Small bilateral pleural effusions are noted. Bibasilar consolidation is seen which may be related to the postoperative state although superimposed pneumonia cannot be  totally excluded. No pneumothorax is identified. No sizable parenchymal nodules are seen.  Musculoskeletal: No acute bony abnormality is noted. Review of the MIP images confirms the above findings. CT ABDOMEN and PELVIS FINDINGS Hepatobiliary: No focal liver abnormality is seen. Status post cholecystectomy. No biliary dilatation. Pancreas: Unremarkable. No pancreatic ductal dilatation or surrounding inflammatory changes. Spleen: Normal in size without focal abnormality. Adrenals/Urinary Tract: Adrenal glands show a left adrenal hypodense lesion likely representing adenoma stable in appearance. The kidneys demonstrate a normal enhancement pattern bilaterally. No renal calculi or urinary tract obstructive changes are seen. Delayed images demonstrate normal excretion of contrast material. No obstructive changes are seen. The bladder is decompressed. Stomach/Bowel: Postsurgical changes are noted in the sigmoid similar to that seen on prior examination. Scattered diverticular changes noted without definitive diverticulitis. No obstructive changes of the colon are seen. The appendix is within normal limits. No obstructive changes of the small bowel are noted. The stomach is decompressed. Vascular/Lymphatic: No significant vascular findings are present. No enlarged abdominal or pelvic lymph nodes. No definitive deep venous thrombosis is noted. Reproductive: Uterus is well visualized and within normal limits. Dominant right adnexal cyst is noted measuring 2.8 cm relatively stable from the prior exam. Other: There are multiple fluid collections identified throughout the abdomen. The largest of these lies anteriorly in the left mid abdomen measuring 6.2 x 4.2 cm best seen on image number 45 of series 4. This is consistent with a postoperative abscess. A second large air-fluid collection measuring 4.8 cm in greatest dimension is noted on image number 44 of series 4. Smaller scattered multifocal air-fluid collections are noted  throughout the abdomen particularly on the left. Fluid collections in the left lower quadrant adjacent to the sigmoid are noted relatively similar to that noted on the prior exam. There remains a considerable amount of free intraperitoneal air. This appears slightly more prominent than that seen on the prior exam. Musculoskeletal: No acute bony abnormality is noted. Review of the MIP images confirms the above findings. IMPRESSION: CTA of the chest: No evidence of pulmonary emboli. Bilateral pleural effusions increased from the prior exam. Bibasilar consolidation is noted which is relatively stable. CT of the abdomen and pelvis: Postoperative changes are noted. There is a considerable amount of free air within the abdomen as well as free fluid. The air appears somewhat greater than that noted on the prior exam and there are more fluid and air fluid collections identified throughout the abdomen. This raises suspicion for new leakage and likely developing abscesses. The largest of these is noted in the left mid abdomen as described above on image number 45. Surgical evaluation is recommended. Electronically Signed   By: Inez Catalina M.D.   On: 08/10/2019 19:12   VAS [place] LOWER EXTREMITY VENOUS (DVT)  Result Date: 08/10/2019  Lower Venous DVTStudy Indications: Edema, and SOB.  Performing Technologist: June Leap RDMS, RVT  Examination Guidelines: A complete evaluation includes B-mode imaging, spectral Doppler, color Doppler, and power Doppler as needed of all accessible portions of each vessel. Bilateral testing is considered an integral part of a complete examination. Limited examinations for reoccurring indications may be performed as noted. The reflux portion of the exam is performed with the patient in reverse Trendelenburg.  +---------+---------------+---------+-----------+----------+--------------+ RIGHT    CompressibilityPhasicitySpontaneityPropertiesThrombus Aging  +---------+---------------+---------+-----------+----------+--------------+ CFV      Full           Yes      Yes                                 +---------+---------------+---------+-----------+----------+--------------+  SFJ      Full                                                        +---------+---------------+---------+-----------+----------+--------------+ FV Prox  Full                                                        +---------+---------------+---------+-----------+----------+--------------+ FV Mid   Full                                                        +---------+---------------+---------+-----------+----------+--------------+ FV DistalFull                                                        +---------+---------------+---------+-----------+----------+--------------+ PFV      Full                                                        +---------+---------------+---------+-----------+----------+--------------+ POP      Full           Yes      Yes                                 +---------+---------------+---------+-----------+----------+--------------+ PTV      Full                                                        +---------+---------------+---------+-----------+----------+--------------+ PERO     Full                                                        +---------+---------------+---------+-----------+----------+--------------+   +---------+---------------+---------+-----------+----------+--------------+ LEFT     CompressibilityPhasicitySpontaneityPropertiesThrombus Aging +---------+---------------+---------+-----------+----------+--------------+ CFV      Full           Yes      Yes                                 +---------+---------------+---------+-----------+----------+--------------+ SFJ      Full                                                         +---------+---------------+---------+-----------+----------+--------------+  FV Prox  Full                                                        +---------+---------------+---------+-----------+----------+--------------+ FV Mid   Full                                                        +---------+---------------+---------+-----------+----------+--------------+ FV DistalFull                                                        +---------+---------------+---------+-----------+----------+--------------+ PFV      Full                                                        +---------+---------------+---------+-----------+----------+--------------+ POP      Full           Yes      Yes                                 +---------+---------------+---------+-----------+----------+--------------+ PTV      Full                                                        +---------+---------------+---------+-----------+----------+--------------+ PERO     Full                                                        +---------+---------------+---------+-----------+----------+--------------+     Summary: BILATERAL: - No evidence of deep vein thrombosis seen in the lower extremities, bilaterally. -No evidence of popliteal cyst, bilaterally.   *See table(s) above for measurements and observations. Electronically signed by Curt Jews MD on 08/10/2019 at 7:28:47 PM.    Final     Assessment: s/p Procedure(s): XI ROBOT ASSISTED SIGMOIDECTOMY FIREFLY INJECTION Patient Active Problem List   Diagnosis Date Noted  . Diverticular disease 08/04/2019  . 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 Absolute anemia 06/25/2019  . Change in bowel habits 06/25/2019  . IUD threads lost 01/26/2019  . History of abnormal cervical Pap smear 01/25/2019  . Hx of diverticulitis of colon 12/24/2018  . Dyspepsia 12/24/2018  . Non-intractable vomiting 12/24/2018  . Abnormal CT scan, pelvis  12/24/2018  . Urinary dysfunction 12/30/2017  . Varicose veins of bilateral lower extremities with pain 10/28/2017  . Low grade squamous intraepithelial lesion (LGSIL) on cervical Pap smear 10/29/2016  . 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  Head injury 06/25/2016  . Essential hypertension 04/22/2016  . Diverticulitis of large intestine with complication   . Multiple sclerosis ([place])   . Diverticulitis 04/21/2016  . Other headache syndrome 02/05/2016  . Vitamin D deficiency 10/09/2015  . Other fatigue 10/09/2015  . Dysesthesia 04/11/2015  . Depression with anxiety 04/11/2015  . Gait disturbance 12/13/2014  . Insomnia 12/13/2014  . Multiple sclerosis, relapsing-remitting ([place]) 08/30/2012  . Relapsing remitting multiple sclerosis ([place]) 08/30/2012    Anastomotic leak, s/p washout, drainage and diversion  Plan: Wbc up a bit today, which is expected Anticipate prolonged ileus.  Will place picc and start TPN Cont IVF's D/c foley tomorrow Cont IV abx Incentive spirometry Cont NG until output decreases   LOS: 8 days     .Rosario Adie, [facility], [place]    08/12/2019 8:28 AM

## 2019-08-12 NOTE — Anesthesia Postprocedure Evaluation (Signed)
Anesthesia Post Note  Patient: [facility]  Procedure(s) Performed: DIAGNOSTIC LAPAROSCOPY WITH [place] OUT, DRAIN PLACEMENT, AND LOOP ILEOSTOMY CREATION (N/A Abdomen)     Patient location during evaluation: PACU Anesthesia Type: General Level of consciousness: awake and alert Pain management: pain level controlled Vital Signs Assessment: post-procedure vital signs reviewed and stable Respiratory status: spontaneous breathing, nonlabored ventilation, respiratory function stable and patient connected to nasal cannula oxygen Cardiovascular status: blood pressure returned to baseline and stable Postop Assessment: no apparent nausea or vomiting Anesthetic complications: no    Last Vitals:  Vitals:   08/12/19 0157 08/12/19 0522  BP: 137/76 117/75  Pulse: 99 98  Resp: 14 14  Temp: 36.6 C 36.6 C  SpO2: 98% 97%    Last Pain:  Vitals:   08/12/19 0600  TempSrc:   PainSc: 0-No pain   Pain Goal: Patients Stated Pain Goal: 3 (08/12/19 0354)                 Lidia Collum

## 2019-08-13 LAB — COMPREHENSIVE METABOLIC PANEL
ALT: 12 U/L (ref 0–44)
AST: 18 U/L (ref 15–41)
Albumin: 1.7 g/dL — ABNORMAL LOW (ref 3.5–5.0)
Alkaline Phosphatase: 77 U/L (ref 38–126)
Anion gap: 8 (ref 5–15)
BUN: 8 mg/dL (ref 6–20)
CO2: 24 mmol/L (ref 22–32)
Calcium: 7.3 mg/dL — ABNORMAL LOW (ref 8.9–10.3)
Chloride: 103 mmol/L (ref 98–111)
Creatinine, Ser: 0.43 mg/dL — ABNORMAL LOW (ref 0.44–1.00)
GFR calc Af Amer: >60 mL/min (ref >60)
GFR calc non Af Amer: >60 mL/min (ref >60)
Glucose, Bld: 128 mg/dL — ABNORMAL HIGH (ref 70–99)
Potassium: 2.8 mmol/L — ABNORMAL LOW (ref 3.5–5.1)
Sodium: 135 mmol/L (ref 135–145)
Total Bilirubin: 0.6 mg/dL (ref 0.3–1.2)
Total Protein: 4.9 g/dL — ABNORMAL LOW (ref 6.5–8.1)

## 2019-08-13 LAB — GLUCOSE, CAPILLARY
Glucose-Capillary: 122 mg/dL — ABNORMAL HIGH (ref 70–99)
Glucose-Capillary: 123 mg/dL — ABNORMAL HIGH (ref 70–99)
Glucose-Capillary: 130 mg/dL — ABNORMAL HIGH (ref 70–99)
Glucose-Capillary: 113 mg/dL — ABNORMAL HIGH (ref 70–99)
Glucose-Capillary: 106 mg/dL — ABNORMAL HIGH (ref 70–99)

## 2019-08-13 LAB — CBC
HCT: 26.5 % — ABNORMAL LOW (ref 36.0–46.0)
Hemoglobin: 8.2 g/dL — ABNORMAL LOW (ref 12.0–15.0)
MCH: 26 pg (ref 26.0–34.0)
MCHC: 30.9 g/dL (ref 30.0–36.0)
MCV: 84.1 fL (ref 80.0–100.0)
Platelets: 414 10*3/uL — ABNORMAL HIGH (ref 150–400)
RBC: 3.15 MIL/uL — ABNORMAL LOW (ref 3.87–5.11)
RDW: 17.4 % — ABNORMAL HIGH (ref 11.5–15.5)
WBC: 13.6 10*3/uL — ABNORMAL HIGH (ref 4.0–10.5)
nRBC: 0 % (ref 0.0–0.2)

## 2019-08-13 LAB — DIFFERENTIAL
Abs Immature Granulocytes: 0.41 10*3/uL — ABNORMAL HIGH (ref 0.00–0.07)
Basophils Absolute: 0.1 10*3/uL (ref 0.0–0.1)
Basophils Relative: 1 %
Eosinophils Absolute: 0.1 10*3/uL (ref 0.0–0.5)
Eosinophils Relative: 1 %
Immature Granulocytes: 3 %
Lymphocytes Relative: 9 %
Lymphs Abs: 1.2 10*3/uL (ref 0.7–4.0)
Monocytes Absolute: 1.1 10*3/uL — ABNORMAL HIGH (ref 0.1–1.0)
Monocytes Relative: 8 %
Neutro Abs: 10.8 10*3/uL — ABNORMAL HIGH (ref 1.7–7.7)
Neutrophils Relative %: 78 %

## 2019-08-13 LAB — TRIGLYCERIDES: Triglycerides: 128 mg/dL (ref <150)

## 2019-08-13 LAB — PREALBUMIN: Prealbumin: 5.6 mg/dL — ABNORMAL LOW (ref 18–38)

## 2019-08-13 LAB — PHOSPHORUS: Phosphorus: 2.2 mg/dL — ABNORMAL LOW (ref 2.5–4.6)

## 2019-08-13 LAB — MAGNESIUM: Magnesium: 1.7 mg/dL (ref 1.7–2.4)

## 2019-08-13 MED ORDER — MAGNESIUM SULFATE 2 GM/50ML IV SOLN
2.0000 g | Freq: Once | INTRAVENOUS | Status: AC
  Administered 2019-08-13: 2 g via INTRAVENOUS
  Filled 2019-08-13: qty 50

## 2019-08-13 MED ORDER — POTASSIUM CHLORIDE 10 MEQ/100ML IV SOLN
10.0000 meq | INTRAVENOUS | Status: AC
  Administered 2019-08-13 (×4): 10 meq via INTRAVENOUS
  Filled 2019-08-13 (×4): qty 100

## 2019-08-13 MED ORDER — TRAVASOL 10 % IV SOLN
INTRAVENOUS | Status: AC
  Filled 2019-08-13: qty 480

## 2019-08-13 MED ORDER — POTASSIUM PHOSPHATES 15 MMOLE/5ML IV SOLN
20.0000 mmol | Freq: Once | INTRAVENOUS | Status: AC
  Administered 2019-08-13: 20 mmol via INTRAVENOUS
  Filled 2019-08-13: qty 6.67

## 2019-08-13 NOTE — Evaluation (Signed)
Physical Therapy Evaluation Patient Details Name: Sandra Bonilla MRN: [id number] DOB: June 28, 1975 Today's Date: 08/13/2019   History of Present Illness  Pt is 44 yo female with hx of MS.  She presented to hospital with diverticulitis.  Pt s/p cystoscopy with ureteral stent, and XI robot assisted sigmoidectomy and LOA on 5/6.  Pt with complications of anastomotic leak and s/p diagnostic laparoscopy with wash out, drain placement, and ileostomy on 5/13.  Clinical Impression  Pt admitted with above diagnosis. Pt very motivated to participate with therapy.  Pt requires increased time and min cues for transfer techniques due to pain.  Pt does have hx of MS but with negligible deficits related to MS at baseline (reports occasional numbness in LE).  She was able to ambulate 61' with RW and min guard.  Educated on benefits of frequent mobility - encouraged to ambulate with nursing in addition to PT. Pt currently with functional limitations due to the deficits listed below (see PT Problem List). Pt will benefit from skilled PT to increase their independence and safety with mobility to allow discharge to the venue listed below.       Follow Up Recommendations Supervision - Intermittent    Equipment Recommendations  Rolling walker with 5" wheels(may progress to no DME needs)    Recommendations for Other Services       Precautions / Restrictions Precautions Precautions: Fall Precaution Comments: NG tube ; JP drain; ostomy      Mobility  Bed Mobility Overal bed mobility: Needs Assistance Bed Mobility: Supine to Sit     Supine to sit: Min assist;HOB elevated     General bed mobility comments: use of bed rails and cues for log roll technique; increased time  Transfers Overall transfer level: Needs assistance Equipment used: Rolling walker (2 wheeled) Transfers: Sit to/from Stand Sit to Stand: Min assist         General transfer comment: cues for safe hand  placement  Ambulation/Gait Ambulation/Gait assistance: Min guard Gait Distance (Feet): 75 Feet Assistive device: Rolling walker (2 wheeled) Gait Pattern/deviations: Decreased stride length Gait velocity: decreased   General Gait Details: cues for RW proximity and posture; fatigued easily; chair follow  Stairs            Wheelchair Mobility    Modified Rankin (Stroke Patients Only)       Balance Overall balance assessment: Needs assistance Sitting-balance support: Bilateral upper extremity supported Sitting balance-Leahy Scale: Good     Standing balance support: No upper extremity supported Standing balance-Leahy Scale: Good                               Pertinent Vitals/Pain Pain Assessment: 0-10 Pain Score: 7  Pain Location: stomach Pain Descriptors / Indicators: Discomfort Pain Intervention(s): Limited activity within patient's tolerance;Monitored during session;Repositioned;Relaxation    Home Living Family/patient expects to be discharged to:: Private residence Living Arrangements: Other (Comment)(brother) Available Help at Discharge: Family;Available PRN/intermittently(brother works during day, some other relatives can assist if needed) Type of Home: House Home Access: Stairs to enter Entrance Stairs-Rails: None(Back has more steps but rails on both sides) Entrance Stairs-Number of Steps: 4 Home Layout: One level Home Equipment: None      Prior Function Level of Independence: Independent         Comments: Very independent; 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); no major deficits from [employer]  Extremity/Trunk Assessment   Upper Extremity Assessment Upper Extremity Assessment: Overall WFL for tasks assessed    Lower Extremity Assessment Lower Extremity Assessment: Overall WFL for tasks assessed    Cervical / Trunk Assessment Cervical / Trunk Assessment: Normal  Communication    Communication: No difficulties  Cognition Arousal/Alertness: Awake/alert Behavior During Therapy: WFL for tasks assessed/performed Overall Cognitive Status: Within Functional Limits for tasks assessed                                        General Comments General comments (skin integrity, edema, etc.): VSS; RN clamped NG tube for ambulation    Exercises     Assessment/Plan    PT Assessment Patient needs continued PT services  PT Problem List Decreased strength;Decreased mobility;Decreased range of motion;Decreased coordination;Decreased activity tolerance;Decreased balance;Decreased knowledge of use of DME       PT Treatment Interventions Therapeutic activities;DME instruction;Gait training;Therapeutic exercise;Patient/family education;Stair training;Balance training;Functional mobility training    PT Goals (Current goals can be found in the Care Plan section)  Acute Rehab PT Goals Patient Stated Goal: return home PT Goal Formulation: With patient Time For Goal Achievement: 08/27/19 Potential to Achieve Goals: Good    Frequency Min 3X/week   Barriers to discharge        Co-evaluation               AM-PAC PT "6 Clicks" Mobility  Outcome Measure Help needed turning from your back to your side while in a flat bed without using bedrails?: A Little Help needed moving from lying on your back to sitting on the side of a flat bed without using bedrails?: A Little Help needed moving to and from a bed to a chair (including a wheelchair)?: A Little Help needed standing up from a chair using your arms (e.g., wheelchair or bedside chair)?: A Little Help needed to walk in hospital room?: A Little Help needed climbing 3-5 steps with a railing? : A Little 6 Click Score: 18    End of Session Equipment Utilized During Treatment: Gait belt Activity Tolerance: Patient tolerated treatment well Patient left: with call bell/phone within reach;in chair;with chair  alarm set Nurse Communication: Mobility status PT Visit Diagnosis: Other abnormalities of gait and mobility (R26.89)    Time: 1130-1155 PT Time Calculation (min) (ACUTE ONLY): 25 min   Charges:   PT Evaluation $PT Eval Moderate Complexity: 1 Mod          Maggie Font, PT Acute Rehab Services Pager [phone] [facility] [phone] [facility] Friend 08/13/2019, 12:50 PM

## 2019-08-13 NOTE — Progress Notes (Signed)
PHARMACY - TOTAL PARENTERAL NUTRITION CONSULT NOTE   Indication: Prolonged ileus  Patient Measurements: Height: 5\' 6"  (167.6 cm) Weight: 106.3 kg (234 lb 5.6 oz) IBW/kg (Calculated) : 59.3 TPN AdjBW (KG): 69.9 Body mass index is 37.82 kg/m. Usual Weight: 102 kg  Assessment:  Patient admitted on 08/04/19 for diverticulitis with microperforation and robotic sigmoidectomy performed.  Anastomotic leak developed and laparoscopy with wash out, drain placement, and ileostomy creation performed on 08/11/19.  Today, TPN consult requested due to anticipation of prolonged ileus.  Glucose / Insulin: CBGs 112, 123 - 2 units SSI given Electrolytes: at risk refeeding syndrome - K 2.8, phos 2.2, mag 1.7 Renal: Scr <1 LFTs / TGs: AST/ALT 18/12. TG 128 Prealbumin / albumin: Prealb 5.6, Alb 1.7 Intake / Output; MIVF: NS 62ml/hr GI Imaging: 5/12 CT abd pelvis: suspicion for new leakage and likely developing abscesses Surgeries / Procedures: 08/04/19 Robotic Sigmoidectomy, 08/11/19 Lap wash out, drain placement, ileostomy creation  Central access: PICC ordered TPN start date: 08/12/19  Nutritional Goals (per RD recommendation on 5/14): kCal: 2200-2400, Protein: 115-130, Fluid: >2.5 L/day  Goal TPN rate is 100 mL/hr (provides 120 g of protein and 2424 kcals per day)  Current Nutrition:  NPO  Plan:  Now:  4 runs KCL IV  Mag Sulfate 2g x 1  KPhos 71mMol x 1  At 1800:  Keep TPN at 40 mL/hr at 1800 until lytes improved  Electrolytes in TPN: 34mEq/L of Na, 33mEq/L of K, 106mEq/L of Ca, 47mEq/L of Mg, and 32mmol/L of Phos. Cl:Ac ratio 1:1  Add standard MVI and trace elements to TPN   Continue Sensitive q4h SSI and adjust as needed   Keep MIVF at 60 mL/hr at 1800  Monitor TPN labs on Mon/Thurs  Kara Mead, PharmD, BCPS 08/13/2019,9:25 AM

## 2019-08-13 NOTE — Progress Notes (Signed)
2 Days Post-Op robotic sigmoidectomy Subjective: Continues to feel a little better.  Not complaining of pain.  NGT output non bilious.  Objective: Vital signs in last 24 hours: Temp:  [97.6 F (36.4 C)-98.7 F (37.1 C)] 97.9 F (36.6 C) (05/15 0403) Pulse Rate:  [90-108] 99 (05/15 0403) Resp:  [14-17] 14 (05/15 0403) BP: (120-135)/(74-78) 120/78 (05/15 0403) SpO2:  [90 %-98 %] 92 % (05/15 0430)   Intake/Output from previous day: 05/14 0701 - 05/15 0700 In: 1999.9 [P.O.:120; I.V.:1849.9; NG/GT:30] Out: 1990 [Urine:1400; Emesis/NG output:350; Drains:40; Stool:200] Intake/Output this shift: Total I/O In: 234.4 [I.V.:182.3; IV Piggyback:52.1] Out: -  General appearance: alert and cooperative GI: normal findings: soft, non-distended JP: SS fluid Ostomy: beefy red with thin bilious liquid output and some air.   Incision: no significant drainage  Lab Results:  Recent Labs    08/12/19 0319 08/13/19 0333  WBC 21.7* 13.6*  HGB 9.4* 8.2*  HCT 29.9* 26.5*  PLT 390 414*   BMET Recent Labs    08/12/19 0319 08/13/19 0333  NA 135 135  K 3.8 2.8*  CL 102 103  CO2 20* 24  GLUCOSE 100* 128*  BUN 8 8  CREATININE 0.54 0.43*  CALCIUM 7.3* 7.3*   PT/INR No results for input(s): LABPROT, INR in the last 72 hours. ABG No results for input(s): PHART, HCO3 in the last 72 hours.  Invalid input(s): PCO2, PO2  MEDS, Scheduled . Chlorhexidine Gluconate Cloth  6 each Topical Daily  . enoxaparin (LOVENOX) injection  40 mg Subcutaneous Q24H  . fluticasone furoate-vilanterol  1 puff Inhalation Daily   And  . umeclidinium bromide  1 puff Inhalation Daily  . gabapentin  300 mg Oral BID  . insulin aspart  0-9 Units Subcutaneous Q4H  . pantoprazole  40 mg Oral QHS  . sodium chloride flush  10-40 mL Intracatheter Q12H    Studies/Results: [place] EKG SITE RITE  Result Date: 08/12/2019 If Site Rite image not attached, placement could not be confirmed due to current cardiac  rhythm.   Assessment: s/p Procedure(s): XI ROBOT ASSISTED SIGMOIDECTOMY FIREFLY INJECTION Patient Active Problem List   Diagnosis Date Noted  . Diverticular disease 08/04/2019  . 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 Absolute anemia 06/25/2019  . Change in bowel habits 06/25/2019  . IUD threads lost 01/26/2019  . History of abnormal cervical Pap smear 01/25/2019  . Hx of diverticulitis of colon 12/24/2018  . Dyspepsia 12/24/2018  . Non-intractable vomiting 12/24/2018  . Abnormal CT scan, pelvis 12/24/2018  . Urinary dysfunction 12/30/2017  . Varicose veins of bilateral lower extremities with pain 10/28/2017  . Low grade squamous intraepithelial lesion (LGSIL) on cervical Pap smear 10/29/2016  . 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 Head injury 06/25/2016  . Essential hypertension 04/22/2016  . Diverticulitis of large intestine with complication   . Multiple sclerosis ([place])   . Diverticulitis 04/21/2016  . Other headache syndrome 02/05/2016  . Vitamin D deficiency 10/09/2015  . Other fatigue 10/09/2015  . Dysesthesia 04/11/2015  . Depression with anxiety 04/11/2015  . Gait disturbance 12/13/2014  . Insomnia 12/13/2014  . Multiple sclerosis, relapsing-remitting ([place]) 08/30/2012  . Relapsing remitting multiple sclerosis ([place]) 08/30/2012    Anastomotic leak, s/p washout, drainage and diversion  Plan: WBCs dropping.   Clamp ngt. Cont IVF's D/c foley  Cont IV abx Incentive spirometry  TNA for prolonged ileus.     LOS: 9 days    Milus Height, MD  FACS Surgical Oncology, General Surgery, Trauma and [place] [facility], [place] for weekday/non holidays Check [email or web address] for coverage night/weekend/holidays  Do not use SecureChat as it is not reliable for timely patient care.     08/13/2019 11:44 AM

## 2019-08-14 LAB — COMPREHENSIVE METABOLIC PANEL
ALT: 11 U/L (ref 0–44)
AST: 20 U/L (ref 15–41)
Albumin: 1.7 g/dL — ABNORMAL LOW (ref 3.5–5.0)
Alkaline Phosphatase: 76 U/L (ref 38–126)
Anion gap: 9 (ref 5–15)
BUN: 5 mg/dL — ABNORMAL LOW (ref 6–20)
CO2: 29 mmol/L (ref 22–32)
Calcium: 7.4 mg/dL — ABNORMAL LOW (ref 8.9–10.3)
Chloride: 102 mmol/L (ref 98–111)
Creatinine, Ser: 0.48 mg/dL (ref 0.44–1.00)
GFR calc Af Amer: >60 mL/min (ref >60)
GFR calc non Af Amer: >60 mL/min (ref >60)
Glucose, Bld: 122 mg/dL — ABNORMAL HIGH (ref 70–99)
Potassium: 3.1 mmol/L — ABNORMAL LOW (ref 3.5–5.1)
Sodium: 140 mmol/L (ref 135–145)
Total Bilirubin: 1 mg/dL (ref 0.3–1.2)
Total Protein: 5.1 g/dL — ABNORMAL LOW (ref 6.5–8.1)

## 2019-08-14 LAB — PHOSPHORUS: Phosphorus: 3 mg/dL (ref 2.5–4.6)

## 2019-08-14 LAB — MAGNESIUM: Magnesium: 1.8 mg/dL (ref 1.7–2.4)

## 2019-08-14 LAB — GLUCOSE, CAPILLARY
Glucose-Capillary: 105 mg/dL — ABNORMAL HIGH (ref 70–99)
Glucose-Capillary: 111 mg/dL — ABNORMAL HIGH (ref 70–99)
Glucose-Capillary: 112 mg/dL — ABNORMAL HIGH (ref 70–99)
Glucose-Capillary: 112 mg/dL — ABNORMAL HIGH (ref 70–99)
Glucose-Capillary: 123 mg/dL — ABNORMAL HIGH (ref 70–99)
Glucose-Capillary: 97 mg/dL (ref 70–99)

## 2019-08-14 MED ORDER — POTASSIUM CHLORIDE 10 MEQ/100ML IV SOLN
10.0000 meq | INTRAVENOUS | Status: AC
  Administered 2019-08-14 (×4): 10 meq via INTRAVENOUS
  Filled 2019-08-14 (×4): qty 100

## 2019-08-14 MED ORDER — TRAVASOL 10 % IV SOLN
INTRAVENOUS | Status: AC
  Filled 2019-08-14: qty 840

## 2019-08-14 NOTE — Progress Notes (Addendum)
PHARMACY - TOTAL PARENTERAL NUTRITION CONSULT NOTE   Indication: Prolonged ileus  Patient Measurements: Height: 5\' 6"  (167.6 cm) Weight: 106.3 kg (234 lb 5.6 oz) IBW/kg (Calculated) : 59.3 TPN AdjBW (KG): 69.9 Body mass index is 37.82 kg/m. Usual Weight: 102 kg  Assessment:  Patient admitted on 08/04/19 for diverticulitis with microperforation and robotic sigmoidectomy performed.  Anastomotic leak developed and laparoscopy with wash out, drain placement, and ileostomy creation performed on 08/11/19.  Today, TPN consult requested due to anticipation of prolonged ileus.  Glucose / Insulin: CBGs at goal < 180 Electrolytes: WNL except K 3.1, phos and mag improved from 5/15 Renal: Scr <1 LFTs / TGs: AST/ALT 20/11. TG 128 Prealbumin / albumin: Prealb 5.6, Alb 1.7 Intake / Output; MIVF: I/O: net 130 mL, no MIVF GI Imaging: 5/12 CT abd pelvis: suspicion for new leakage and likely developing abscesses Surgeries / Procedures: 08/04/19 Robotic Sigmoidectomy, 08/11/19 Lap wash out, drain placement, ileostomy creation  Central access: PICC ordered TPN start date: 08/12/19  Nutritional Goals (per RD recommendation on 5/14): kCal: 2200-2400, Protein: 115-130, Fluid: >2.5 L/day  Goal TPN rate is 100 mL/hr (provides 120 g of protein and 2424 kcals per day)  Current Nutrition:  NPO  Plan:  Now:  4 runs KCL IV  At 1800:  Increase TPN to rate 70 mL/hr at 1800  Electrolytes in TPN: 64mEq/L of Na, 35mEq/L of K, 32mEq/L of Ca, 46mEq/L of Mg, and 26mmol/L of Phos. Cl:Ac ratio 1:1  Add standard MVI and trace elements to TPN   Continue Sensitive q4h SSI and adjust as needed   MIVF per Md  Monitor TPN labs on Mon/Thurs  Kara Mead, PharmD, BCPS 08/14/2019,8:58 AM

## 2019-08-14 NOTE — Progress Notes (Signed)
[place] Office:  [phone] General Surgery Progress Note   LOS: 10 days  POD -  3 Days Post-Op  Assessment and Plan: 1.  Leak  DIAGNOSTIC LAPAROSCOPY WITH [place] OUT, DRAIN PLACEMENT, AND LOOP ILEOSTOMY CREATION - 08/11/2019 - [place]  WBC - 13,600 - 08/13/2019  Zosyn  Has tolerated NGT out, ileostomy functioning, will start clear liquids - she should have labs tomorrow for her TPN  2.  Robotic sigmoid colectomy, enterolysis - 08/04/2019 - [place] 3.  MS 4.  Malnutrition - on TPN 5.  Anemia  Hgb - 8.2 - 08/13/2019 6.  DVT prophylaxis - Lovenox   Active Problems:   Diverticular disease  Subjective:  Tenderness is getting better.   Wants something more than ice chips.  Objective:   Vitals:   08/13/19 2142 08/14/19 0459  BP: 138/77 134/79  Pulse: (!) 103 99  Resp: 15 15  Temp: 98.6 F (37 C) 98.2 F (36.8 C)  SpO2: 94% 94%     Intake/Output from previous day:  05/15 0701 - 05/16 0700 In: 2340.3 [P.O.:150; I.V.:1211.2; IV Piggyback:979.1] Out: [id number] [Urine:1500; Emesis/NG output:300; Drains:35; [id number]  Intake/Output this shift:  No intake/output data recorded.   Physical Exam:   General: Obese WF who is alert and oriented.    HEENT: Normal. Pupils equal. .   Lungs: Clear.  IS = 750 cc   Abdomen: Soft, has BS.   Wound: RLQ ostomy with output.  Wounds looks clean   Lab Results:    Recent Labs    08/12/19 0319 08/13/19 0333  WBC 21.7* 13.6*  HGB 9.4* 8.2*  HCT 29.9* 26.5*  PLT 390 414*    BMET   Recent Labs    08/13/19 0333 08/14/19 0346  NA 135 140  K 2.8* 3.1*  CL 103 102  CO2 24 29  GLUCOSE 128* 122*  BUN 8 5*  CREATININE 0.43* 0.48  CALCIUM 7.3* 7.4*    PT/INR  No results for input(s): LABPROT, INR in the last 72 hours.  ABG  No results for input(s): PHART, HCO3 in the last 72 hours.  Invalid input(s): PCO2, PO2   Studies/Results:  No results found.   Anti-infectives:   Anti-infectives (From admission, onward)    Start     Dose/Rate Route Frequency Ordered Stop   08/07/19 0830  piperacillin-tazobactam (ZOSYN) IVPB 3.375 g     3.375 g 12.5 mL/hr over 240 Minutes Intravenous Every 8 hours 08/07/19 0803     08/04/19 2000  cefoTEtan (CEFOTAN) 2 g in sodium chloride 0.9 % 100 mL IVPB     2 g 200 mL/hr over 30 Minutes Intravenous Every 12 hours 08/04/19 1204 08/04/19 2107   08/04/19 0615  cefoTEtan (CEFOTAN) 2 g in sodium chloride 0.9 % 100 mL IVPB     2 g 200 mL/hr over 30 Minutes Intravenous On call to O.R. 08/04/19 0604 08/04/19 [place], MD, [facility] Surgery Office: [phone] 08/14/2019

## 2019-08-15 LAB — COMPREHENSIVE METABOLIC PANEL
ALT: 13 U/L (ref 0–44)
AST: 22 U/L (ref 15–41)
Albumin: 1.7 g/dL — ABNORMAL LOW (ref 3.5–5.0)
Alkaline Phosphatase: 81 U/L (ref 38–126)
Anion gap: 11 (ref 5–15)
BUN: 7 mg/dL (ref 6–20)
CO2: 28 mmol/L (ref 22–32)
Calcium: 7.6 mg/dL — ABNORMAL LOW (ref 8.9–10.3)
Chloride: 100 mmol/L (ref 98–111)
Creatinine, Ser: 0.49 mg/dL (ref 0.44–1.00)
GFR calc Af Amer: >60 mL/min (ref >60)
GFR calc non Af Amer: >60 mL/min (ref >60)
Glucose, Bld: 126 mg/dL — ABNORMAL HIGH (ref 70–99)
Potassium: 3.7 mmol/L (ref 3.5–5.1)
Sodium: 139 mmol/L (ref 135–145)
Total Bilirubin: 1 mg/dL (ref 0.3–1.2)
Total Protein: 5.3 g/dL — ABNORMAL LOW (ref 6.5–8.1)

## 2019-08-15 LAB — CBC
HCT: 24.7 % — ABNORMAL LOW (ref 36.0–46.0)
Hemoglobin: 7.7 g/dL — ABNORMAL LOW (ref 12.0–15.0)
MCH: 26.7 pg (ref 26.0–34.0)
MCHC: 31.2 g/dL (ref 30.0–36.0)
MCV: 85.8 fL (ref 80.0–100.0)
Platelets: 450 10*3/uL — ABNORMAL HIGH (ref 150–400)
RBC: 2.88 MIL/uL — ABNORMAL LOW (ref 3.87–5.11)
RDW: 17.5 % — ABNORMAL HIGH (ref 11.5–15.5)
WBC: 15.5 10*3/uL — ABNORMAL HIGH (ref 4.0–10.5)
nRBC: 0 % (ref 0.0–0.2)

## 2019-08-15 LAB — GLUCOSE, CAPILLARY
Glucose-Capillary: 109 mg/dL — ABNORMAL HIGH (ref 70–99)
Glucose-Capillary: 113 mg/dL — ABNORMAL HIGH (ref 70–99)
Glucose-Capillary: 116 mg/dL — ABNORMAL HIGH (ref 70–99)
Glucose-Capillary: 117 mg/dL — ABNORMAL HIGH (ref 70–99)
Glucose-Capillary: 123 mg/dL — ABNORMAL HIGH (ref 70–99)
Glucose-Capillary: 129 mg/dL — ABNORMAL HIGH (ref 70–99)

## 2019-08-15 LAB — TRIGLYCERIDES: Triglycerides: 108 mg/dL (ref <150)

## 2019-08-15 LAB — DIFFERENTIAL
Abs Immature Granulocytes: 0.71 10*3/uL — ABNORMAL HIGH (ref 0.00–0.07)
Basophils Absolute: 0.1 10*3/uL (ref 0.0–0.1)
Basophils Relative: 1 %
Eosinophils Absolute: 0.2 10*3/uL (ref 0.0–0.5)
Eosinophils Relative: 1 %
Immature Granulocytes: 5 %
Lymphocytes Relative: 9 %
Lymphs Abs: 1.3 10*3/uL (ref 0.7–4.0)
Monocytes Absolute: 1.5 10*3/uL — ABNORMAL HIGH (ref 0.1–1.0)
Monocytes Relative: 10 %
Neutro Abs: 11.7 10*3/uL — ABNORMAL HIGH (ref 1.7–7.7)
Neutrophils Relative %: 74 %

## 2019-08-15 LAB — PHOSPHORUS: Phosphorus: 4.1 mg/dL (ref 2.5–4.6)

## 2019-08-15 LAB — MAGNESIUM: Magnesium: 1.8 mg/dL (ref 1.7–2.4)

## 2019-08-15 LAB — PREALBUMIN: Prealbumin: 6.4 mg/dL — ABNORMAL LOW (ref 18–38)

## 2019-08-15 MED ORDER — TRAVASOL 10 % IV SOLN
INTRAVENOUS | Status: AC
  Filled 2019-08-15: qty 1200

## 2019-08-15 MED ORDER — SODIUM CHLORIDE 0.9 % IV SOLN: INTRAVENOUS | Status: DC | PRN

## 2019-08-15 NOTE — Progress Notes (Signed)
Physical Therapy Treatment Patient Details Name: Sandra Bonilla MRN: [id number] DOB: July 09, 1975 Today's Date: 08/15/2019    History of Present Illness Pt is 44 yo female with hx of MS.  She presented to hospital with diverticulitis.  Pt s/p cystoscopy with ureteral stent, and XI robot assisted sigmoidectomy and LOA on 5/6.  Pt with complications of anastomotic leak and s/p diagnostic laparoscopy with wash out, drain placement, and ileostomy on 5/13.    PT Comments    Pt continues to participate and progress well. Will continue to follow.   Follow Up Recommendations  Home health PT;Supervision - Intermittent     Equipment Recommendations  Rolling walker with 5" wheels    Recommendations for Other Services       Precautions / Restrictions Precautions Precautions: Fall Precaution Comments: JP drain; ostomy Restrictions Weight Bearing Restrictions: No    Mobility  Bed Mobility Overal bed mobility: Needs Assistance Bed Mobility: Supine to Sit;Sit to Supine     Supine to sit: Min guard;HOB elevated Sit to supine: Min guard;HOB elevated   General bed mobility comments: HOB elevated at pt's request. Pt relied heavily on bedrail. Increased time.  Transfers Overall transfer level: Needs assistance Equipment used: Rolling walker (2 wheeled) Transfers: Sit to/from Stand Sit to Stand: Min guard         General transfer comment: cues for safe hand placement  Ambulation/Gait Ambulation/Gait assistance: Min guard Gait Distance (Feet): 175 Feet Assistive device: Rolling walker (2 wheeled) Gait Pattern/deviations: Step-through pattern;Decreased stride length     General Gait Details: Pt fatigues fairly easily. She reported bil LE fatigue after ~100 feet. Slow gait speed.   Stairs             Wheelchair Mobility    Modified Rankin (Stroke Patients Only)       Balance Overall balance assessment: Needs assistance         Standing balance support:  Bilateral upper extremity supported Standing balance-Leahy Scale: Fair                              Cognition Arousal/Alertness: Awake/alert Behavior During Therapy: WFL for tasks assessed/performed Overall Cognitive Status: Within Functional Limits for tasks assessed                                        Exercises      General Comments        Pertinent Vitals/Pain Pain Assessment: 0-10 Pain Score: 7  Pain Location: abdomen Pain Descriptors / Indicators: Discomfort;Sore Pain Intervention(s): Monitored during session;Repositioned    Home Living                      Prior Function            PT Goals (current goals can now be found in the care plan section) Progress towards PT goals: Progressing toward goals    Frequency    Min 3X/week      PT Plan Current plan remains appropriate    Co-evaluation              AM-PAC PT "6 Clicks" Mobility   Outcome Measure  Help needed turning from your back to your side while in a flat bed without using bedrails?: A Little Help needed moving from lying on your back to sitting on the  side of a flat bed without using bedrails?: A Little Help needed moving to and from a bed to a chair (including a wheelchair)?: A Little Help needed standing up from a chair using your arms (e.g., wheelchair or bedside chair)?: A Little Help needed to walk in hospital room?: A Little Help needed climbing 3-5 steps with a railing? : A Little 6 Click Score: 18    End of Session Equipment Utilized During Treatment: Gait belt Activity Tolerance: Patient tolerated treatment well Patient left: in bed;with call bell/phone within reach   PT Visit Diagnosis: Muscle weakness (generalized) (M62.81);Other abnormalities of gait and mobility (R26.89)     Time: [id number] PT Time Calculation (min) (ACUTE ONLY): 16 min  Charges:  $Gait Training: 8-22 mins                         Doreatha Massed, PT Acute  Rehabilitation

## 2019-08-15 NOTE — Consult Note (Signed)
[place] Nurse ostomy follow up Stoma type/location: RMQ ileostomy with red rubber catheter rod in place. Stomal assessment/size: 1 and 3/4 inches round, red, edematous, red rod in place. Peristomal assessment: intact. Crease (umbilical, natural waist) along bottom margin of stoma. Treatment options for stomal/peristomal skin: skin barrier ring Output: thin, brown effluent Ostomy pouching: 2pc. 2 and 3/4 inch pouching system with skin barrier ringh Education provided: Patient taught to empty pouch today and will practice over next two days to become independent. Read along with the step-by-step instructions as I changed pouching system today. Asking appropriate questions.  Will potentially have another drain place or have drain repositioned due to continuing discomfort in right shoulder, so was not able to assist with pouch change today.  Next pouch change is Wednesday.  If all right with Dr. [name], would remove rod with next pouch change. Please advise.  Supplies in room for continuing care.  Patient would benefit from [facility] for continued teaching and ostomy support with pouch changes until competent.  Plan is to be independent in emptying prior to discharge.  Enrolled patient in [place] Discharge program: Yes, previously.  [place] nursing team will follow, and will remain available to this patient, the nursing and medical teams.  Thanks, Maudie Flakes, MSN, RN, [place], Arther Abbott  Pager# [phone]

## 2019-08-15 NOTE — TOC Initial Note (Signed)
Transition of Care [facility]) - Initial/Assessment Note    Patient Details  Name: Sandra Bonilla MRN: [id number] Date of Birth: 20-Sep-1975  Transition of Care (TOC) CM/SW Contact:    Joaquin Courts, RN Phone Number: 08/15/2019, 2:02 PM  Clinical Narrative:      CM spoke with patient regarding need for [facility] services.  Patient set up with [facility] home care for [facility] and PT.                Expected Discharge Plan: [place] Barriers to Discharge: Continued Medical Work up   Patient Goals and CMS Choice Patient states their goals for this hospitalization and ongoing recovery are:: to go home with home health CMS Medicare.gov Compare Post Acute Care list provided to:: Patient Choice offered to / list presented to : Patient  Expected Discharge Plan and Services Expected Discharge Plan: [place]   Discharge Planning Services: CM Consult Post Acute Care Choice: [place] arrangements for the past 2 months: Single Family Home                           HH Arranged: PT, RN [facility] Agency: [place] Date [facility] Agency Contacted: 08/15/19 Time HH Agency Contacted: 45 Representative spoke with at [place]: [place] Arrangements/Services Living arrangements for the past 2 months: [place]   Patient language and need for interpreter reviewed:: Yes Do you feel safe going back to the place where you live?: Yes      Need for Family Participation in Patient Care: No (Comment) Care giver support system in place?: Yes (comment)   Criminal Activity/Legal Involvement Pertinent to Current Situation/Hospitalization: No - Comment as needed  Activities of Daily Living Home Assistive Devices/Equipment: None ADL Screening (condition at time of admission) Patient's cognitive ability adequate to safely complete daily activities?: Yes Is the patient deaf or have difficulty hearing?: No Does the patient have difficulty  seeing, even when wearing glasses/contacts?: No Does the patient have difficulty concentrating, remembering, or making decisions?: No Patient able to express need for assistance with ADLs?: Yes Does the patient have difficulty dressing or bathing?: No Independently performs ADLs?: Yes (appropriate for developmental age) Does the patient have difficulty walking or climbing stairs?: Yes(MS) Weakness of Legs: Both(MS) Weakness of Arms/Hands: Both(MS)  Permission Sought/Granted                  Emotional Assessment Appearance:: Appears stated age Attitude/Demeanor/Rapport: Engaged Affect (typically observed): Accepting Orientation: : Oriented to Self, Oriented to Place, Oriented to  Time, Oriented to Situation   Psych Involvement: No (comment)  Admission diagnosis:  Diverticular disease [K57.90] Patient Active Problem List   Diagnosis Date Noted  . Diverticular disease 08/04/2019  . 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 Absolute anemia 06/25/2019  . Change in bowel habits 06/25/2019  . IUD threads lost 01/26/2019  . History of abnormal cervical Pap smear 01/25/2019  . Hx of diverticulitis of colon 12/24/2018  . Dyspepsia 12/24/2018  . Non-intractable vomiting 12/24/2018  . Abnormal CT scan, pelvis 12/24/2018  . Urinary dysfunction 12/30/2017  . Varicose veins of bilateral lower extremities with pain 10/28/2017  . Low grade squamous intraepithelial lesion (LGSIL) on cervical Pap smear 10/29/2016  . 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 Head injury 06/25/2016  . Essential hypertension 04/22/2016  . Diverticulitis of large intestine with complication   .  Multiple sclerosis ([place])   . Diverticulitis 04/21/2016  . Other headache syndrome 02/05/2016  . Vitamin D deficiency 10/09/2015  . Other fatigue 10/09/2015  . Dysesthesia 04/11/2015  . Depression with anxiety 04/11/2015  . Gait disturbance 12/13/2014  . Insomnia 12/13/2014   . Multiple sclerosis, relapsing-remitting ([place]) 08/30/2012  . Relapsing remitting multiple sclerosis ([place]) 08/30/2012   PCP:  [name], MD Pharmacy:   [facility] [place], [place] - [place] [address] [place] [address] Phone: [phone] Fax: [phone]  [facility] All Sites - [place] [address] Phone: [phone] Fax: [phone]  [facility] ([place], [address] [place] [address] Phone: [phone] Fax: [phone]     Social Determinants of Health (SDOH) Interventions    Readmission Risk Interventions No flowsheet data found.

## 2019-08-15 NOTE — Consult Note (Signed)
[place] Nurse ostomy consult note  Addendum:  Dr. [name] in agreement with rod removal on Wednesday with next pouch change.  [place] nursing team will continue to follow, and will remain available to this patient, the nursing and medical teams.  Thanks, Maudie Flakes, MSN, RN, [place], Arther Abbott  Pager# [phone]

## 2019-08-15 NOTE — Progress Notes (Signed)
4 Days Post-Op lap washout and ileostomy Subjective: Pt feels a bit better.  Having ostomy function.  Still has right shoulder pain Objective: Vital signs in last 24 hours: Temp:  [98 F (36.7 C)-98.6 F (37 C)] 98.6 F (37 C) (05/17 0416) Pulse Rate:  [105-115] 107 (05/17 0416) Resp:  [14-16] 16 (05/17 0416) BP: (122-139)/(75-87) 134/75 (05/17 0416) SpO2:  [90 %-99 %] 90 % (05/17 0416)   Intake/Output from previous day: 05/16 0701 - 05/17 0700 In: 1987.4 [P.O.:180; I.V.:1407.4; IV Piggyback:400] Out: 190 [Drains:15; Stool:175] Intake/Output this shift: Total I/O In: -  Out: 75 [Stool:75] General appearance: alert and cooperative GI: normal findings: soft, non-distended JP: brownish fluid Ostomy: beefy red, with air and bilious output Incision: no significant drainage  Lab Results:  Recent Labs    08/13/19 0333 08/15/19 0301  WBC 13.6* 15.5*  HGB 8.2* 7.7*  HCT 26.5* 24.7*  PLT 414* 450*   BMET Recent Labs    08/14/19 0346 08/15/19 0301  NA 140 139  K 3.1* 3.7  CL 102 100  CO2 29 28  GLUCOSE 122* 126*  BUN 5* 7  CREATININE 0.48 0.49  CALCIUM 7.4* 7.6*   PT/INR No results for input(s): LABPROT, INR in the last 72 hours. ABG No results for input(s): PHART, HCO3 in the last 72 hours.  Invalid input(s): PCO2, PO2  MEDS, Scheduled . Chlorhexidine Gluconate Cloth  6 each Topical Daily  . enoxaparin (LOVENOX) injection  40 mg Subcutaneous Q24H  . fluticasone furoate-vilanterol  1 puff Inhalation Daily   And  . umeclidinium bromide  1 puff Inhalation Daily  . gabapentin  300 mg Oral BID  . insulin aspart  0-9 Units Subcutaneous Q4H  . pantoprazole  40 mg Oral QHS  . sodium chloride flush  10-40 mL Intracatheter Q12H    Studies/Results: No results found.  Assessment: s/p Procedure(s): XI ROBOT ASSISTED SIGMOIDECTOMY FIREFLY INJECTION Patient Active Problem List   Diagnosis Date Noted  . Diverticular disease 08/04/2019  . Tubulovillous adenoma  of rectum 06/25/2019  . Absolute anemia 06/25/2019  . Change in bowel habits 06/25/2019  . IUD threads lost 01/26/2019  . History of abnormal cervical Pap smear 01/25/2019  . Hx of diverticulitis of colon 12/24/2018  . Dyspepsia 12/24/2018  . Non-intractable vomiting 12/24/2018  . Abnormal CT scan, pelvis 12/24/2018  . Urinary dysfunction 12/30/2017  . Varicose veins of bilateral lower extremities with pain 10/28/2017  . Low grade squamous intraepithelial lesion (LGSIL) on cervical Pap smear 10/29/2016  . 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 Head injury 06/25/2016  . Essential hypertension 04/22/2016  . Diverticulitis of large intestine with complication   . Multiple sclerosis ([place])   . Diverticulitis 04/21/2016  . Other headache syndrome 02/05/2016  . Vitamin D deficiency 10/09/2015  . Other fatigue 10/09/2015  . Dysesthesia 04/11/2015  . Depression with anxiety 04/11/2015  . Gait disturbance 12/13/2014  . Insomnia 12/13/2014  . Multiple sclerosis, relapsing-remitting ([place]) 08/30/2012  . Relapsing remitting multiple sclerosis ([place]) 08/30/2012    Anastomotic leak, s/p washout, drainage and diversion  Plan: Wbc up a bit today, will recheck in AM Cont TPN Minimize IVF's Cont IV abx Incentive spirometry   LOS: 11 days     .Rosario Adie, MD [facility] Surgery, [place]    08/15/2019 8:30 AM

## 2019-08-15 NOTE — Progress Notes (Signed)
PHARMACY - TOTAL PARENTERAL NUTRITION CONSULT NOTE   Indication: Prolonged ileus  Patient Measurements: Height: 5\' 6"  (167.6 cm) Weight: 106.3 kg (234 lb 5.6 oz) IBW/kg (Calculated) : 59.3 TPN AdjBW (KG): 69.9 Body mass index is 37.82 kg/m. Usual Weight: 102 kg  Assessment:  Patient admitted on 08/04/19 for diverticulitis with microperforation and robotic sigmoidectomy performed.  Anastomotic leak developed and laparoscopy with wash out, drain placement, and ileostomy creation performed on 08/11/19.  Today, TPN consult requested due to anticipation of prolonged ileus.  Glucose / Insulin: CBGs at goal < 150; 2 insulin units in past 24h Electrolytes: K low at 3.7 (goal>4), Mag low at 1.8 (goal>2), otherwise WNL Renal: Scr <1 LFTs / TGs: LFTs WNL; TG 128 (5/15), 108 (5/17) Prealbumin / albumin: Prealb 5.6 (5/15), 6.4 (5/17), Alb 1.7 Intake / Output; MIVF: 81ml output from drains, no MIVF GI Imaging: 5/12 CT abd pelvis: suspicion for new leakage and likely developing abscesses Surgeries / Procedures: 08/04/19 Robotic Sigmoidectomy, 08/11/19 Lap wash out, drain placement, ileostomy creation  Central access: PICC ordered TPN start date: 08/12/19  Nutritional Goals (per RD recommendation on 5/14): kCal: 2200-2400, Protein: 115-130, Fluid: >2.5 L/day  Goal TPN rate is 100 mL/hr (provides 120 g of protein and 2260 kcals per day)  Current Nutrition:  NPO  Plan:  At 1800:  Increase TPN to rate 100 mL/hr at 1800  Electrolytes in TPN: 91mEq/L of Na, 73mEq/L of K, 52mEq/L of Ca, 61mEq/L of Mg, and 71mmol/L of Phos. Cl:Ac ratio 1:1  Add standard MVI and trace elements to TPN  Continue Sensitive q4h SSI and adjust as needed   MIVF per Md  Monitor TPN labs on Mon/Thurs.  Check Bmet, Mag, Phos in am.   Netta Cedars, PharmD, BCPS 08/15/2019,7:44 AM

## 2019-08-16 ENCOUNTER — Inpatient Hospital Stay (HOSPITAL_COMMUNITY): Payer: BC Managed Care – PPO

## 2019-08-16 LAB — CBC
HCT: 23.9 % — ABNORMAL LOW (ref 36.0–46.0)
Hemoglobin: 7.5 g/dL — ABNORMAL LOW (ref 12.0–15.0)
MCH: 26.8 pg (ref 26.0–34.0)
MCHC: 31.4 g/dL (ref 30.0–36.0)
MCV: 85.4 fL (ref 80.0–100.0)
Platelets: 481 10*3/uL — ABNORMAL HIGH (ref 150–400)
RBC: 2.8 MIL/uL — ABNORMAL LOW (ref 3.87–5.11)
RDW: 17.6 % — ABNORMAL HIGH (ref 11.5–15.5)
WBC: 18 10*3/uL — ABNORMAL HIGH (ref 4.0–10.5)
nRBC: 0 % (ref 0.0–0.2)

## 2019-08-16 LAB — BASIC METABOLIC PANEL
Anion gap: 9 (ref 5–15)
BUN: 12 mg/dL (ref 6–20)
CO2: 25 mmol/L (ref 22–32)
Calcium: 8 mg/dL — ABNORMAL LOW (ref 8.9–10.3)
Chloride: 101 mmol/L (ref 98–111)
Creatinine, Ser: 0.56 mg/dL (ref 0.44–1.00)
GFR calc Af Amer: >60 mL/min (ref >60)
GFR calc non Af Amer: >60 mL/min (ref >60)
Glucose, Bld: 124 mg/dL — ABNORMAL HIGH (ref 70–99)
Potassium: 4.3 mmol/L (ref 3.5–5.1)
Sodium: 135 mmol/L (ref 135–145)

## 2019-08-16 LAB — MAGNESIUM: Magnesium: 1.9 mg/dL (ref 1.7–2.4)

## 2019-08-16 LAB — PHOSPHORUS: Phosphorus: 4.4 mg/dL (ref 2.5–4.6)

## 2019-08-16 LAB — GLUCOSE, CAPILLARY
Glucose-Capillary: 102 mg/dL — ABNORMAL HIGH (ref 70–99)
Glucose-Capillary: 104 mg/dL — ABNORMAL HIGH (ref 70–99)
Glucose-Capillary: 109 mg/dL — ABNORMAL HIGH (ref 70–99)
Glucose-Capillary: 116 mg/dL — ABNORMAL HIGH (ref 70–99)
Glucose-Capillary: 124 mg/dL — ABNORMAL HIGH (ref 70–99)
Glucose-Capillary: 97 mg/dL (ref 70–99)

## 2019-08-16 MED ORDER — IOHEXOL 300 MG/ML  SOLN
100.0000 mL | Freq: Once | INTRAMUSCULAR | Status: AC | PRN
  Administered 2019-08-16: 100 mL via INTRAVENOUS

## 2019-08-16 MED ORDER — IOHEXOL 9 MG/ML PO SOLN
ORAL | Status: AC
  Filled 2019-08-16: qty 1000

## 2019-08-16 MED ORDER — INSULIN ASPART 100 UNIT/ML ~~LOC~~ SOLN
0.0000 [IU] | Freq: Three times a day (TID) | SUBCUTANEOUS | Status: DC
  Administered 2019-08-18 – 2019-08-23 (×7): 1 [IU] via SUBCUTANEOUS

## 2019-08-16 MED ORDER — SODIUM CHLORIDE (PF) 0.9 % IJ SOLN
INTRAMUSCULAR | Status: AC
  Filled 2019-08-16: qty 50

## 2019-08-16 MED ORDER — IOHEXOL 9 MG/ML PO SOLN: 500.0000 mL | ORAL | Status: AC

## 2019-08-16 MED ORDER — TRAVASOL 10 % IV SOLN
INTRAVENOUS | Status: AC
  Filled 2019-08-16: qty 1200

## 2019-08-16 NOTE — Progress Notes (Signed)
Patient unable to tolerate PO contrast. Patient has been attempting the bottle for the past two hours without much success.    Called attending physician to ask about how she wants [place] to proceed.   Will educate patient, provide encouragement and reassurance, and inform CT how much contrast the patient has been able to tolerate.      [place], Therapist, sports

## 2019-08-16 NOTE — Progress Notes (Signed)
5 Days Post-Op lap washout and ileostomy Subjective: Not drinking much.  Having ostomy function.  Still has right shoulder pain Objective: Vital signs in last 24 hours: Temp:  [97.6 F (36.4 C)-98.3 F (36.8 C)] 97.6 F (36.4 C) (05/18 0404) Pulse Rate:  [105-110] 105 (05/18 0404) Resp:  [16-17] 16 (05/18 0404) BP: (125-130)/(75-80) 128/75 (05/18 0404) SpO2:  [95 %-97 %] 96 % (05/18 0404) Weight:  [99 kg] 99 kg (05/18 0359)   Intake/Output from previous day: 05/17 0701 - 05/18 0700 In: 3038.5 [P.O.:780; I.V.:2045.7; IV Piggyback:112.8] Out: 320 [Drains:20; Stool:300] Intake/Output this shift: No intake/output data recorded. General appearance: alert and cooperative GI: normal findings: soft, non-distended JP: brownish fluid Ostomy: beefy red, with air and bilious output Incision: no significant drainage  Lab Results:  Recent Labs    08/15/19 0301 08/16/19 0312  WBC 15.5* 18.0*  HGB 7.7* 7.5*  HCT 24.7* 23.9*  PLT 450* 481*   BMET Recent Labs    08/15/19 0301 08/16/19 0312  NA 139 135  K 3.7 4.3  CL 100 101  CO2 28 25  GLUCOSE 126* 124*  BUN 7 12  CREATININE 0.49 0.56  CALCIUM 7.6* 8.0*   PT/INR No results for input(s): LABPROT, INR in the last 72 hours. ABG No results for input(s): PHART, HCO3 in the last 72 hours.  Invalid input(s): PCO2, PO2  MEDS, Scheduled . Chlorhexidine Gluconate Cloth  6 each Topical Daily  . enoxaparin (LOVENOX) injection  40 mg Subcutaneous Q24H  . fluticasone furoate-vilanterol  1 puff Inhalation Daily   And  . umeclidinium bromide  1 puff Inhalation Daily  . gabapentin  300 mg Oral BID  . insulin aspart  0-9 Units Subcutaneous Q8H  . pantoprazole  40 mg Oral QHS  . sodium chloride flush  10-40 mL Intracatheter Q12H    Studies/Results: No results found.  Assessment: s/p Procedure(s): XI ROBOT ASSISTED SIGMOIDECTOMY FIREFLY INJECTION Patient Active Problem List   Diagnosis Date Noted  . Diverticular disease  08/04/2019  . 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 Absolute anemia 06/25/2019  . Change in bowel habits 06/25/2019  . IUD threads lost 01/26/2019  . History of abnormal cervical Pap smear 01/25/2019  . Hx of diverticulitis of colon 12/24/2018  . Dyspepsia 12/24/2018  . Non-intractable vomiting 12/24/2018  . Abnormal CT scan, pelvis 12/24/2018  . Urinary dysfunction 12/30/2017  . Varicose veins of bilateral lower extremities with pain 10/28/2017  . Low grade squamous intraepithelial lesion (LGSIL) on cervical Pap smear 10/29/2016  . 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 Head injury 06/25/2016  . Essential hypertension 04/22/2016  . Diverticulitis of large intestine with complication   . Multiple sclerosis ([place])   . Diverticulitis 04/21/2016  . Other headache syndrome 02/05/2016  . Vitamin D deficiency 10/09/2015  . Other fatigue 10/09/2015  . Dysesthesia 04/11/2015  . Depression with anxiety 04/11/2015  . Gait disturbance 12/13/2014  . Insomnia 12/13/2014  . Multiple sclerosis, relapsing-remitting ([place]) 08/30/2012  . Relapsing remitting multiple sclerosis ([place]) 08/30/2012    Anastomotic leak, s/p washout, drainage and diversion  Plan: Wbc up again today, will get CT, suspect RUQ abscess Cont TPN Minimize IVF's Cont IV abx Incentive spirometry Ambulate TID   LOS: 12 days     .Rosario Adie, MD [facility], [place]    08/16/2019 8:30 AM

## 2019-08-16 NOTE — Progress Notes (Signed)
PHARMACY - TOTAL PARENTERAL NUTRITION CONSULT NOTE   Indication: Prolonged ileus  Patient Measurements: Height: 5\' 6"  (167.6 cm) Weight: 99 kg (218 lb 4.1 oz) IBW/kg (Calculated) : 59.3 TPN AdjBW (KG): 69.9 Body mass index is 35.23 kg/m. Usual Weight: 102 kg  Assessment:  Patient admitted on 08/04/19 for diverticulitis with microperforation and robotic sigmoidectomy performed.  Anastomotic leak developed and laparoscopy with wash out, drain placement, and ileostomy creation performed on 08/11/19.  TPN consult requested in anticipation of prolonged ileus.  Glucose / Insulin: CBGs at goal < 150; 1 unit insulin given in past 24h Electrolytes: Mag low at 1.9 (goal>2), otherwise electrolytes WNL Renal: Scr <1 LFTs / TGs: LFTs WNL; TG 128 (5/15), 108 (5/17) Prealbumin / albumin: Prealb 5.6 (5/15), 6.4 (5/17), Alb 1.7 Intake / Output; MIVF: 32ml output from drains, no MIVF GI Imaging: 5/12 CT abd pelvis: suspicion for new leakage and likely developing abscesses Surgeries / Procedures: 08/04/19 Robotic Sigmoidectomy, 08/11/19 Lap wash out, drain placement, ileostomy creation  Central access: PICC ordered TPN start date: 08/12/19  Nutritional Goals (per RD recommendation on 5/14): kCal: 2200-2400, Protein: 115-130, Fluid: >2.5 L/day  Goal TPN rate is 100 mL/hr (provides 120 g of protein and 2260 kcals per day)  Current Nutrition:  NPO  Plan:  At 1800:  Continue TPN at 100 mL/hr   Electrolytes in TPN: 73mEq/L of Na, 1mEq/L of K, 64mEq/L of Ca, 68mEq/L of Mg (increase), and 58mmol/L of Phos. Cl:Ac ratio 1:1  Add standard MVI and trace elements to TPN  Decrease Sensitive SSI & CBG checks to q8h and adjust as needed   MIVF per Md  Monitor TPN labs on Mon/Thurs.  Check Bmet, Mag, Phos in am.   Netta Cedars, PharmD, BCPS 08/16/2019,7:22 AM

## 2019-08-17 ENCOUNTER — Inpatient Hospital Stay (HOSPITAL_COMMUNITY): Payer: BC Managed Care – PPO

## 2019-08-17 LAB — BASIC METABOLIC PANEL
Anion gap: 10 (ref 5–15)
BUN: 14 mg/dL (ref 6–20)
CO2: 25 mmol/L (ref 22–32)
Calcium: 8.2 mg/dL — ABNORMAL LOW (ref 8.9–10.3)
Chloride: 101 mmol/L (ref 98–111)
Creatinine, Ser: 0.66 mg/dL (ref 0.44–1.00)
GFR calc Af Amer: >60 mL/min (ref >60)
GFR calc non Af Amer: >60 mL/min (ref >60)
Glucose, Bld: 129 mg/dL — ABNORMAL HIGH (ref 70–99)
Potassium: 4.4 mmol/L (ref 3.5–5.1)
Sodium: 136 mmol/L (ref 135–145)

## 2019-08-17 LAB — GLUCOSE, CAPILLARY
Glucose-Capillary: 104 mg/dL — ABNORMAL HIGH (ref 70–99)
Glucose-Capillary: 121 mg/dL — ABNORMAL HIGH (ref 70–99)
Glucose-Capillary: 118 mg/dL — ABNORMAL HIGH (ref 70–99)
Glucose-Capillary: 128 mg/dL — ABNORMAL HIGH (ref 70–99)

## 2019-08-17 LAB — PHOSPHORUS: Phosphorus: 4.7 mg/dL — ABNORMAL HIGH (ref 2.5–4.6)

## 2019-08-17 LAB — MAGNESIUM: Magnesium: 2.1 mg/dL (ref 1.7–2.4)

## 2019-08-17 LAB — PROTIME-INR
INR: 1.1 (ref 0.8–1.2)
Prothrombin Time: 14.1 seconds (ref 11.4–15.2)

## 2019-08-17 MED ORDER — MIDAZOLAM HCL 2 MG/2ML IJ SOLN
INTRAMUSCULAR | Status: AC
  Filled 2019-08-17: qty 4

## 2019-08-17 MED ORDER — LIDOCAINE HCL (PF) 1 % IJ SOLN
INTRAMUSCULAR | Status: AC | PRN
  Administered 2019-08-17 (×2): 5 mL
  Administered 2019-08-17: 10 mL

## 2019-08-17 MED ORDER — FENTANYL CITRATE (PF) 100 MCG/2ML IJ SOLN
INTRAMUSCULAR | Status: AC | PRN
  Administered 2019-08-17 (×4): 50 ug via INTRAVENOUS

## 2019-08-17 MED ORDER — TRAVASOL 10 % IV SOLN
INTRAVENOUS | Status: AC
  Filled 2019-08-17: qty 1200

## 2019-08-17 MED ORDER — KETOROLAC TROMETHAMINE 30 MG/ML IJ SOLN
30.0000 mg | Freq: Three times a day (TID) | INTRAMUSCULAR | Status: DC
  Administered 2019-08-17 – 2019-08-21 (×11): 30 mg via INTRAVENOUS
  Filled 2019-08-17 (×11): qty 1

## 2019-08-17 MED ORDER — FENTANYL CITRATE (PF) 100 MCG/2ML IJ SOLN
INTRAMUSCULAR | Status: AC
  Filled 2019-08-17: qty 4

## 2019-08-17 MED ORDER — SODIUM CHLORIDE 0.9% FLUSH
5.0000 mL | Freq: Three times a day (TID) | INTRAVENOUS | Status: DC
  Administered 2019-08-18 – 2019-08-29 (×30): 5 mL

## 2019-08-17 MED ORDER — MIDAZOLAM HCL 2 MG/2ML IJ SOLN
INTRAMUSCULAR | Status: AC | PRN
  Administered 2019-08-17 (×4): 1 mg via INTRAVENOUS

## 2019-08-17 NOTE — Consult Note (Signed)
[place] Nurse ostomy follow up Stoma type/location: RUQ, loop ileosotmy Stomal assessment/size: 1 3/4" round, budded functional os at 7 o'clock   Peristomal assessment: intact; red rubber support rod removed without difficulty Treatment options for stomal/peristomal skin: 2" skin barrier ring used for dips in the abdomen at 3 and 9 o'clock  Output liquid brown Ostomy pouching: 2pc. 2 3/4" used, however she noted leakage at 9 o'clock yesterday and due to the location of the os at skin level may need convexity.  Will order flex convex for next pouch change  Education provided:   Demonstrated pouch change (cutting new skin barrier; allowed patient to cut once pattern used to mark new skin barrier Patient verbalized how to measure stoma, Demonstrated  cleaning peristomal skin and stoma, Allowed patient to fit  barrier ring Education on emptying when 1/3 to 1/2 full and how to empty; patient reports she has been doing this will staff assitance  Patient verbalized use of wick to clean spout  Discussed bathing, best times to change pouch, pouch frequency     Enrolled patient in [place] Start Discharge program: Yes  [place] Nurse will follow along with you for continued support with ostomy teaching and care [place] MSN, RN, [place], [place], [place]  .

## 2019-08-17 NOTE — Progress Notes (Signed)
PHARMACY - TOTAL PARENTERAL NUTRITION CONSULT NOTE   Indication: Prolonged ileus  Patient Measurements: Height: 5\' 6"  (167.6 cm) Weight: 98.5 kg (217 lb 2.5 oz) IBW/kg (Calculated) : 59.3 TPN AdjBW (KG): 69.9 Body mass index is 35.05 kg/m. Usual Weight: 102 kg  Assessment:  Patient admitted on 08/04/19 for diverticulitis with microperforation and robotic sigmoidectomy performed.  Anastomotic leak developed and laparoscopy with wash out, drain placement, and ileostomy creation performed on 08/11/19.  TPN consult requested in anticipation of prolonged ileus.  Glucose / Insulin: CBGs at goal < 150; 0 units insulin given in past 24h Electrolytes: Phos slightly elevated at 4.7. K+ WNL, but trending up. All others WNL Renal: SCr <1 LFTs / TGs: LFTs WNL (5/17); TG 128 (5/15), 108 (5/17) Prealbumin / albumin: Prealb 5.6 (5/15), 6.4 (5/17), Alb 1.7 Intake / Output; MIVF: 49ml output from drains, no MIVF GI Imaging: 5/12 CT abd pelvis: suspicion for new leakage and likely developing abscesses; 5/18 CT abd pelvis: Similar size and distribution of multiple abdominal and pelvic gas and fluid collections, most consistent with persistent or recurrent abscesses. Increased fluid and decreased gas within the sigmoid mesocolon, adjacent to a surgical drain. Cannot exclude anastomotic leak in this region.  Surgeries / Procedures: 08/04/19 Robotic Sigmoidectomy, 08/11/19 Lap wash out, drain placement, ileostomy creation  Central access: PICC ordered TPN start date: 08/12/19  Nutritional Goals (per RD recommendation on 5/14): kCal: 2200-2400, Protein: 115-130, Fluid: >2.5 L/day  Goal TPN rate is 100 mL/hr (provides 120 g of protein and 2261 kCal per day)  Current Nutrition:  NPO  Plan:  At 1800:  Continue TPN at 100 mL/hr   Electrolytes in TPN: continue Na 68mEq/L, decrease K+ to 86mEq/L, continue Ca 23mEq/L, continue increased Mag of 62mEq/L, and remove Phos. Cl:Ac ratio 1:1  Add standard MVI and trace  elements to TPN  Continue Sensitive SSI & CBG checks q8h and adjust as needed   MIVF per MD (none currently)  Monitor TPN labs on Mon/Thurs     Lindell Spar, PharmD, BCPS Clinical Pharmacist  08/17/2019,9:37 AM

## 2019-08-17 NOTE — Procedures (Signed)
Pre procedural Dx: Post op abdominal fluid collections Post procedural Dx: Same  Technically successful CT guided placed of a 10 Fr drainage catheter placement x3 into separate collections within the mid aspect of the abdomen yielding a total of 100 cc of purulent material.    A representative aspirated sample was capped and sent to the laboratory for analysis.    EBL: Trace Complications: None immediate  Ronny Bacon, MD Pager #: [phone]

## 2019-08-17 NOTE — Progress Notes (Signed)
MEDICATION-RELATED CONSULT NOTE   IR Procedure Consult - Anticoagulant/Antiplatelet PTA/Inpatient Med List Review by Pharmacist    Procedure: CT guided placed of a 10 Fr drainage catheter placement x3 into separate collections    Completed: 5/19 14:27  Post-Procedural bleeding risk per IR MD assessment:  standard  Antithrombotic medications on inpatient or PTA profile prior to procedure:   LMWH 40    Recommended restart time per IR Post-Procedure Guidelines:  Day + 1 (next AM)    Plan:    LMWH 40 qday , next dose 5/20 at 08 am  Eudelia Bunch, Pharm.D 08/17/2019 5:22 PM

## 2019-08-17 NOTE — Progress Notes (Signed)
PT Cancellation Note  Patient Details Name: Sandra Bonilla MRN: [id number] DOB: 28-Jul-1975   Cancelled Treatment:    Reason Eval/Treat Not Completed: Patient at procedure or test/unavailable. Pt had 3 drains placed by IR today. Now in CT. Will check back another day.    Doreatha Massed, PT Acute Rehabilitation

## 2019-08-17 NOTE — Progress Notes (Signed)
Referring Physician(s): Sea Bright  Supervising Physician: Sandi Mariscal  Patient Status:  [facility] - In-pt  Chief Complaint:  Abdominal/right shoulder pain,  abdominal fluid collections  Subjective: Patient familiar to IR service from pelvic/diverticular abscess drainage on 11/02/2018 followed by subsequent removal on 11/16/2018 with negative fistulogram.  She is status post robot-assisted sigmoidectomy with lysis of adhesions on 08/04/2019 secondary to complicated diverticulitis.  Secondary to anastomotic leak patient underwent laparoscopy with washout, drain placement and ileostomy creation on 08/11/2019.  She now complains of persistent abdominal/right shoulder pain with persistent leukocytosis and latest CT findings of 1. Interval placement of a right lower quadrant loop ileostomy. 2. Similar size and distribution of multiple abdominal and pelvic gas and fluid collections, most consistent with persistent or recurrent abscesses. Increased fluid and decreased gas within the sigmoid mesocolon, adjacent to a surgical drain. Cannot exclude anastomotic leak in this region. 3. Right greater than left pleural effusions with bibasilar atelectasis and/or infection. 4. Air within the urinary bladder. Correlate with instrumentation. If no recent instrumentation, fistulous communication would be a consideration. 5. Prominent caudate and lateral segment left liver lobes. Correlate with risk factors for mild cirrhosis. 6. Left adrenal adenoma.  Request now received from surgical team for consideration of aspiration and possible drainage of the abdominal/pelvic fluid collections.  She currently denies fever, headache, chest pain, worsening dyspnea, cough, nausea, vomiting or bleeding.  She does have some occasional right flank discomfort.  Past Medical History:  Diagnosis Date  . Anemia 05/2019   iron infusions  . Anxiety   . Asthma   . Cervical high risk human papillomavirus (HPV) DNA test positive  09/2015  . Cholecystitis 2006   GALBLADDER REMOVED  . Depression   . Diverticulitis   . Diverticulosis   . 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 History of mammogram 11/2014   FIBROADENOMA  . History of Papanicolaou smear of cervix 01/24/13; 10/15/15   -/-; -/+  . Hypertension   . Menorrhagia 02/01/2010   D/C HYSTEROSCOPY ENDO POLYP  . MS (multiple sclerosis) ([place])   . Ovarian cyst 9/15; 2015-2016   LEFT - WENT TO [place]; RIGHT  . 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 BIOPSY  03/21/2019   Procedure: BIOPSY;  Surgeon: Rush Landmark Telford Nab., MD;  Location: [place];  Service: Gastroenterology;;  . BREAST BIOPSY Right 2016   Fibroadenoma  . CHOLECYSTECTOMY  2006  . COLON RESECTION N/A 08/11/2019   Procedure: DIAGNOSTIC LAPAROSCOPY WITH [place] OUT, DRAIN PLACEMENT, AND LOOP ILEOSTOMY CREATION;  Surgeon: [name], MD;  Location: [facility];  Service: General;  Laterality: N/A;  . COLONOSCOPY WITH PROPOFOL N/A 03/21/2019   Procedure: COLONOSCOPY WITH PROPOFOL;  Surgeon: [name], MD;  Location: [place];  Service: Gastroenterology;  Laterality: N/A;  ultraslim colonoscope   . CYSTOSCOPY WITH INJECTION N/A 08/04/2019   Procedure: FIREFLY INJECTION;  Surgeon: Ardis Hughs, MD;  Location: [facility];  Service: Urology;  Laterality: N/A;  . DILATION AND CURETTAGE, DIAGNOSTIC / THERAPEUTIC  02/01/2010   D/C HYSTEROSCOPY ENDO POLYP; [facility]  . ENDOSCOPIC MUCOSAL RESECTION  03/21/2019   Procedure: ENDOSCOPIC MUCOSAL RESECTION;  Surgeon: Rush Landmark Telford Nab., MD;  Location: [place];  Service: Gastroenterology;;  . ESOPHAGOGASTRODUODENOSCOPY (EGD) WITH PROPOFOL N/A 03/21/2019   Procedure: ESOPHAGOGASTRODUODENOSCOPY (EGD) WITH PROPOFOL;  Surgeon: [name], MD;  Location: [place];  Service: Gastroenterology;  Laterality: N/A;  . EYE SURGERY  [place] SIGMOIDOSCOPY  07/31/2016     Procedure: FLEXIBLE SIGMOIDOSCOPY;  Surgeon: [name], MD;  Location: [facility];  Service: Endoscopy;;  . HEMOSTASIS CLIP PLACEMENT  03/21/2019   Procedure: HEMOSTASIS CLIP PLACEMENT;  Surgeon: [name], MD;  Location: [place];  Service: Gastroenterology;;  . HYSTEROSCOPY  02/01/2010   ENDO POLYP - [facility]  . IR RADIOLOGIST EVAL & MGMT  11/16/2018  . SCLEROTHERAPY  03/21/2019   Procedure: Clide Deutscher;  Surgeon: Mansouraty, Telford Nab., MD;  Location: [facility];  Service: Gastroenterology;;      Allergies: Sulfamethoxazole-trimethoprim and Sulfa antibiotics  Medications: Prior to Admission medications   Medication Sig Start Date End Date Taking? Authorizing Provider  AUBAGIO 14 MG TABS TAKE 1 TABLET BY MOUTH  DAILY Patient taking differently: Take 14 mg by mouth daily.  02/28/19  Yes Sater, Nanine Means, MD  chlorthalidone (HYGROTON) 25 MG tablet Take 12.5 mg by mouth daily.   Yes [provider]  cholecalciferol (VITAMIN D3) 25 MCG (1000 UT) tablet Take 1,000 Units by mouth daily.   Yes [provider]  ferrous gluconate (FERGON) 324 MG tablet TAKE 1 TABLET(324 MG) BY MOUTH DAILY WITH BREAKFAST Patient taking differently: Take 324 mg by mouth daily with breakfast.  05/18/19  Yes Mansouraty, Telford Nab., MD  folic acid (FOLVITE) 1 MG tablet TAKE 1 TABLET(1 MG) BY MOUTH DAILY Patient taking differently: Take 1 mg by mouth daily.  07/04/19  Yes Mansouraty, Telford Nab., MD  LORazepam (ATIVAN) 1 MG tablet Take 1 mg by mouth 2 (two) times daily as needed for anxiety.  12/15/18  Yes [provider]  omeprazole (PRILOSEC) 40 MG capsule TAKE 1 CAPSULE(40 MG) BY MOUTH DAILY BEFORE BREAKFAST 08/03/19  Yes Mansouraty, Telford Nab., MD  oxybutynin (DITROPAN) 5 MG tablet TAKE 1 TABLET(5 MG) BY MOUTH TWICE DAILY Patient taking differently: Take 5 mg by mouth 2 (two) times daily.  04/13/19  Yes Sater, Nanine Means, MD  potassium chloride (MICRO-K) 10 MEQ CR  capsule Take 10 mEq by mouth every morning. 06/29/19  Yes [provider]  Probiotic Product (PROBIOTIC PO) Take 1 capsule by mouth daily.   Yes [provider]  TRELEGY ELLIPTA 100-62.5-25 MCG/INH AEPB Inhale 1 puff into the lungs daily. 02/10/19  Yes [provider]  acetaminophen (TYLENOL) 325 MG tablet Take 2 tablets (650 mg total) by mouth every 6 (six) hours as needed for mild pain (or Fever >/= 101). 11/05/18   Samuella Cota, MD  cyclobenzaprine (FLEXERIL) 5 MG tablet Take 1 tablet (5 mg total) by mouth at bedtime as needed. Patient taking differently: Take 5 mg by mouth at bedtime as needed for muscle spasms.  12/26/16   Sater, Nanine Means, MD  gabapentin (NEURONTIN) 100 MG capsule Take 100 mg by mouth daily as needed (nerve pain).  05/19/18   [provider]  levonorgestrel (MIRENA) 20 MCG/24HR IUD 1 each by Intrauterine route once. 11/14/15   [provider]  meloxicam (MOBIC) 15 MG tablet Take 1 tablet (15 mg total) by mouth daily. Patient taking differently: Take 15 mg by mouth daily as needed for pain.  12/26/16   Sater, Nanine Means, MD  Polyvinyl Alcohol-Povidone (REFRESH OP) Place 2 drops into both eyes 4 (four) times daily as needed (dryness).    [provider]     Vital Signs: BP 132/75 (BP Location: Left Arm)   Pulse (!) 106   Temp 97.8 F (36.6 C) (Oral)   Resp 17  Ht 5\' 6"  (1.676 m)   Wt 217 lb 2.5 oz (98.5 kg)   LMP 08/08/2019   SpO2 96%   BMI 35.05 kg/m   Physical Exam awake, alert.  Chest with slightly diminished breath sounds bases.  Heart with slightly tachycardic but regular rhythm.  Abdomen soft, positive bowel sounds, ostomy / right lower quadrant surgical drain intact, mid abdominal /right lower quadrant regions slightly tender to palpation; extremities with full range of motion  Imaging: CT ABDOMEN PELVIS W CONTRAST  Result Date: 08/16/2019 CLINICAL DATA:  Status post colon resection 08/11/2019. Diagnostic  laparoscopy with drain placement and loop ileostomy creation 08/11/2019. Elevated white blood cell count. Right upper quadrant pain. EXAM: CT ABDOMEN AND PELVIS WITH CONTRAST TECHNIQUE: Multidetector CT imaging of the abdomen and pelvis was performed using the standard protocol following bolus administration of intravenous contrast. CONTRAST:  14mL OMNIPAQUE IOHEXOL 300 MG/ML  SOLN COMPARISON:  08/10/2019 FINDINGS: Lower chest: Right base collapse/consolidative change is similar. Slight improvement in left base aeration with airspace disease remaining. Small right pleural effusion is similar. Trace left pleural fluid is decreased. Upper normal sized right cardiophrenic angle nodes. Hepatobiliary: Prominent caudate and lateral segment left liver lobes. No focal liver lesion. Cholecystectomy, without biliary ductal dilatation. Pancreas: Normal, without mass or ductal dilatation. Spleen: Normal in size, without focal abnormality. Adrenals/Urinary Tract: Normal right adrenal gland. A left adrenal 1.6 cm nodule has been described back to 04/21/2016 where it was low-density, consistent with an adenoma. Normal kidneys, without hydronephrosis. Air within the urinary bladder. Stomach/Bowel: Normal stomach, without wall thickening. Surgical changes about the sigmoid. Residual scattered colonic diverticula. Right abdominal loop ileostomy, new since the prior. Normal small bowel caliber. A right lower quadrant surgical drain has been placed in the interval. This terminates just cephalad to the sigmoid surgical sutures. -Left anterior abdominal gas and fluid collection measures 5.8 x 4.3 cm on 37/2 and is similar to 5.9 x 4.3 cm on the prior exam (when remeasured). -Just central and caudal to this, gas and fluid collection measures 4.6 x 4.5 cm on 36/2 and is similar to on the prior exam (when remeasured). -A central upper pelvic gas and fluid collection measures 4.7 x 3.6 cm on 49/2. Compare 4.6 x 3.7 cm on the prior exam  (when remeasured). -Decreased gas with increased fluid within the sigmoid mesocolon, positioned just anterior to the distal portion of the right lower quadrant drain. Example 73/2. -Other smaller peripherally enhancing fluid collections are identified and are relatively similar in size and distribution to the prior. Vascular/Lymphatic: Normal caliber of the aorta and branch vessels. Prominent but not pathologically sized abdominal retroperitoneal nodes are likely reactive. No pelvic sidewall adenopathy. Reproductive: Persistent anasarca. Other: None. Musculoskeletal: Degenerate disc disease at L3-4. IMPRESSION: 1. Interval placement of a right lower quadrant loop ileostomy. 2. Similar size and distribution of multiple abdominal and pelvic gas and fluid collections, most consistent with persistent or recurrent abscesses. Increased fluid and decreased gas within the sigmoid mesocolon, adjacent to a surgical drain. Cannot exclude anastomotic leak in this region. 3. Right greater than left pleural effusions with bibasilar atelectasis and/or infection. 4. Air within the urinary bladder. Correlate with instrumentation. If no recent instrumentation, fistulous communication would be a consideration. 5. Prominent caudate and lateral segment left liver lobes. Correlate with risk factors for mild cirrhosis. 6. Left adrenal adenoma. Electronically Signed   By: Abigail Miyamoto M.D.   On: 08/16/2019 15:57    Labs:  CBC: Recent Labs  08/12/19 0319 08/13/19 0333 08/15/19 0301 08/16/19 0312  WBC 21.7* 13.6* 15.5* 18.0*  HGB 9.4* 8.2* 7.7* 7.5*  HCT 29.9* 26.5* 24.7* 23.9*  PLT 390 414* 450* 481*    COAGS: Recent Labs    11/01/18 1531  INR 1.5*    BMP: Recent Labs    08/14/19 0346 08/15/19 0301 08/16/19 0312 08/17/19 0338  NA 140 139 135 136  K 3.1* 3.7 4.3 4.4  CL 102 100 101 101  CO2 29 28 25 25   GLUCOSE 122* 126* 124* 129*  BUN 5* 7 12 14   CALCIUM 7.4* 7.6* 8.0* 8.2*  CREATININE 0.48 0.49 0.56  0.66  GFRNONAA >60 >60 >60 >60  GFRAA >60 >60 >60 >60    LIVER FUNCTION TESTS: Recent Labs    03/08/19 1527 08/13/19 0333 08/14/19 0346 08/15/19 0301  BILITOT 0.5 0.6 1.0 1.0  AST 21 18 20 22   ALT 17 12 11 13   ALKPHOS 91 77 76 81  PROT 6.9 4.9* 5.1* 5.3*  ALBUMIN 4.0 1.7* 1.7* 1.7*    Assessment and Plan: 44 yo female with prior hx of diverticular/ pelvic abscess drainage on 11/02/2018 with subsequent drain removal following negative fistulogram on 11/16/2018. She is status post robot-assisted sigmoidectomy with lysis of adhesions on 08/04/2019 secondary to complicated diverticulitis.  Secondary to anastomotic leak patient underwent laparoscopy with washout, drain placement and ileostomy creation on 08/11/2019.  She now complains of persistent abdominal/right shoulder pain with persistent leukocytosis and latest CT findings of 1. Interval placement of a right lower quadrant loop ileostomy. 2. Similar size and distribution of multiple abdominal and pelvic gas and fluid collections, most consistent with persistent or recurrent abscesses. Increased fluid and decreased gas within the sigmoid mesocolon, adjacent to a surgical drain. Cannot exclude anastomotic leak in this region. 3. Right greater than left pleural effusions with bibasilar atelectasis and/or infection. 4. Air within the urinary bladder. Correlate with instrumentation. If no recent instrumentation, fistulous communication would be a consideration. 5. Prominent caudate and lateral segment left liver lobes. Correlate with risk factors for mild cirrhosis. 6. Left adrenal adenoma.  Request now received from surgical team for consideration of aspiration and possible drainage of the abdominal/pelvic fluid collections.  Imaging studies have been reviewed by Dr. Pascal Lux.  Details/risks of procedure, including but not limited to, internal bleeding, infection, injury to adjacent structures discussed with patient with her understanding  and consent.  Procedure tentatively scheduled for later this afternoon.  COVID-19 negative.   Electronically Signed: D. Rowe Robert, PA-C 08/17/2019, 9:46 AM   I spent a total of 25 minutes at the the patient's bedside AND on the patient's hospital floor or unit, greater than 50% of which was counseling/coordinating care for CT-guided aspiration/ possible drainage of abdominal/pelvic fluid collections    Patient ID: Sandra Bonilla, female   DOB: 07/10/1975, 44 y.o.   MRN: [id number]

## 2019-08-18 LAB — COMPREHENSIVE METABOLIC PANEL
ALT: 19 U/L (ref 0–44)
AST: 30 U/L (ref 15–41)
Albumin: 1.8 g/dL — ABNORMAL LOW (ref 3.5–5.0)
Alkaline Phosphatase: 86 U/L (ref 38–126)
Anion gap: 7 (ref 5–15)
BUN: 18 mg/dL (ref 6–20)
CO2: 26 mmol/L (ref 22–32)
Calcium: 8.3 mg/dL — ABNORMAL LOW (ref 8.9–10.3)
Chloride: 103 mmol/L (ref 98–111)
Creatinine, Ser: 0.57 mg/dL (ref 0.44–1.00)
GFR calc Af Amer: >60 mL/min (ref >60)
GFR calc non Af Amer: >60 mL/min (ref >60)
Glucose, Bld: 133 mg/dL — ABNORMAL HIGH (ref 70–99)
Potassium: 4 mmol/L (ref 3.5–5.1)
Sodium: 136 mmol/L (ref 135–145)
Total Bilirubin: 1 mg/dL (ref 0.3–1.2)
Total Protein: 6 g/dL — ABNORMAL LOW (ref 6.5–8.1)

## 2019-08-18 LAB — CBC
HCT: 24.2 % — ABNORMAL LOW (ref 36.0–46.0)
Hemoglobin: 7.4 g/dL — ABNORMAL LOW (ref 12.0–15.0)
MCH: 26.2 pg (ref 26.0–34.0)
MCHC: 30.6 g/dL (ref 30.0–36.0)
MCV: 85.8 fL (ref 80.0–100.0)
Platelets: 619 10*3/uL — ABNORMAL HIGH (ref 150–400)
RBC: 2.82 MIL/uL — ABNORMAL LOW (ref 3.87–5.11)
RDW: 17.5 % — ABNORMAL HIGH (ref 11.5–15.5)
WBC: 14.2 10*3/uL — ABNORMAL HIGH (ref 4.0–10.5)
nRBC: 0 % (ref 0.0–0.2)

## 2019-08-18 LAB — PHOSPHORUS: Phosphorus: 4.7 mg/dL — ABNORMAL HIGH (ref 2.5–4.6)

## 2019-08-18 LAB — GLUCOSE, CAPILLARY
Glucose-Capillary: 114 mg/dL — ABNORMAL HIGH (ref 70–99)
Glucose-Capillary: 123 mg/dL — ABNORMAL HIGH (ref 70–99)
Glucose-Capillary: 121 mg/dL — ABNORMAL HIGH (ref 70–99)

## 2019-08-18 LAB — MAGNESIUM: Magnesium: 2.4 mg/dL (ref 1.7–2.4)

## 2019-08-18 MED ORDER — TRAVASOL 10 % IV SOLN
INTRAVENOUS | Status: AC
  Filled 2019-08-18: qty 1200

## 2019-08-18 NOTE — Progress Notes (Signed)
Nutrition Follow-up  RD working remotely.  DOCUMENTATION CODES:   Obesity unspecified  INTERVENTION:  - continue TPN/begin TPN weaning per Pharmacist. - will order Magic Cup BID with meals, each supplement provides 290 kcal and 9 grams of protein. - diet advancement as medically feasible.   NUTRITION DIAGNOSIS:   Increased nutrient needs related to acute illness, post-op healing as evidenced by estimated needs. -revised, ongoing  GOAL:   Patient will meet greater than or equal to 90% of their needs -met with TPN and minimal PO intake  MONITOR:   PO intake, Supplement acceptance, Diet advancement, Labs, Weight trends, Other (Comment)(TPN)  ASSESSMENT:   44 year old female with a history of MS, diverticulosis, diverticulitis, HTN, fibroadenoma of the R breast, anxiety, depression, asthma, and anemia. Patient presented to the hospital due to irregular BMs and worsening abdominal pain.  Significant Events: 5/6- admission 5/13- 23F NGT placement in L nare; diagnostic lap with washout, drain placement, and ileostomy creation  5/14- initial RD assessment; double lumen PICC placed in R basilic; TPN initiation 3/87- NGT removed 5/16- diet advanced from NPO to CLD 5/17- diet advanced to FLD 5/19- intra-abdominal drain placement x3 d/t intra-abdominal fluid collections.   Patient was NPO all day yesterday. Per flow sheet documentation, she consumed 0% of dinner on 5/17 and all meals on 5/18. Weight is trending back down. Flow sheet documentation indicates mild edema to BLE.   She is receiving custom TPN at goal rate of 100 ml/hr via double lumen PICC. This regimen is providing 2261 kcal and 120 grams protein. Plan to continue this regimen at this time.   She was previously experiencing severe, ongoing abdominal pain which has significantly improved since drain placement x3 yesterday afternoon. Hopeful to try something for lunch later today now that she is feeling better.     Labs  reviewed; CBGs: 114 and 123 mg/dl, Ca: 8.3 mg/dl, Phos: 4.7 mg/dl. Medications reviewed; sliding scale novolog, 40 mg oral protonix/day.   Diet Order:   Diet Order            Diet full liquid Room service appropriate? Yes; Fluid consistency: Thin  Diet effective now              EDUCATION NEEDS:   Not appropriate for education at this time  Skin:  Skin Assessment: Skin Integrity Issues: Skin Integrity Issues:: Incisions Incisions: abdomen (5/13)  Last BM:  5/19  Height:   Ht Readings from Last 1 Encounters:  08/17/19 5' 6"  (1.676 m)    Weight:   Wt Readings from Last 1 Encounters:  08/18/19 95.4 kg     Estimated Nutritional Needs:  Kcal:  2200-2400 kcal Protein:  115-130 grams Fluid:  >/= 2.5 L/day     Jarome Matin, MS, RD, LDN, CNSC Inpatient Clinical Dietitian RD pager # available in [facility]  After hours/weekend pager # available in [facility]

## 2019-08-18 NOTE — Progress Notes (Signed)
Physical Therapy Treatment Patient Details Name: Sandra Bonilla MRN: [id number] DOB: 23-Sep-1975 Today's Date: 08/18/2019    History of Present Illness Pt is 44 yo female with hx of MS.  She presented to hospital with diverticulitis.  Pt s/p cystoscopy with ureteral stent, and XI robot assisted sigmoidectomy and LOA on 5/6.  Pt with complications of anastomotic leak and s/p diagnostic laparoscopy with wash out, drain placement, and ileostomy on 5/13.    PT Comments    General Comments: very motivated and positive attitude.  General bed mobility comments: increased time and use of rail required increased assist back to bed to support B LE due to recent ABD surgeries.  General transfer comment: good safety cognition and use of hands to steady self.  Also assisted with a toilet transfer.General Gait Details: pt declined need for RW but then she used IV pole for support.  Slow but steady gait. Assisted back to bed per pt request.    Follow Up Recommendations  Home health PT;Supervision - Intermittent     Equipment Recommendations  Rolling walker with 5" wheels    Recommendations for Other Services       Precautions / Restrictions Precautions Precautions: Fall Precaution Comments: 4 JP drains; ostomy Restrictions Weight Bearing Restrictions: No    Mobility  Bed Mobility Overal bed mobility: Needs Assistance Bed Mobility: Supine to Sit;Sit to Supine     Supine to sit: Supervision Sit to supine: Modified independent (Device/Increase time);Min guard;Min assist   General bed mobility comments: increased time and use of rail required increased assist back to bed to support B LE due to recent ABD surgeries  Transfers Overall transfer level: Needs assistance Equipment used: None Transfers: Sit to/from [facility] Sit to Stand: Supervision Stand pivot transfers: Supervision;Min guard       General transfer comment: good safety cognition and use of hands to steady  self.  Also assisted with a toilet transfer.  Ambulation/Gait Ambulation/Gait assistance: Supervision;Min guard Gait Distance (Feet): 180 Feet Assistive device: IV Pole Gait Pattern/deviations: Step-through pattern;Decreased stride length;Trunk flexed Gait velocity: decreased   General Gait Details: pt declined need for RW but then she used IV pole for support.  Slow but steady gait.   Stairs             Wheelchair Mobility    Modified Rankin (Stroke Patients Only)       Balance                                            Cognition Arousal/Alertness: Awake/alert Behavior During Therapy: WFL for tasks assessed/performed Overall Cognitive Status: Within Functional Limits for tasks assessed                                 General Comments: very motivated and positive attitude      Exercises      General Comments        Pertinent Vitals/Pain Pain Assessment: Faces Faces Pain Scale: Hurts little more Pain Location: abdomen with activity Pain Descriptors / Indicators: Discomfort;Sore;Tender Pain Intervention(s): Monitored during session    Home Living                      Prior Function            PT  Goals (current goals can now be found in the care plan section) Progress towards PT goals: Progressing toward goals    Frequency    Min 3X/week      PT Plan Current plan remains appropriate    Co-evaluation              AM-PAC PT "6 Clicks" Mobility   Outcome Measure  Help needed turning from your back to your side while in a flat bed without using bedrails?: A Little Help needed moving from lying on your back to sitting on the side of a flat bed without using bedrails?: A Little Help needed moving to and from a bed to a chair (including a wheelchair)?: A Little Help needed standing up from a chair using your arms (e.g., wheelchair or bedside chair)?: A Little Help needed to walk in hospital room?: A  Little Help needed climbing 3-5 steps with a railing? : A Little 6 Click Score: 18    End of Session Equipment Utilized During Treatment: Gait belt Activity Tolerance: Patient tolerated treatment well Patient left: in bed;with call bell/phone within reach Nurse Communication: Mobility status PT Visit Diagnosis: Muscle weakness (generalized) (M62.81);Other abnormalities of gait and mobility (R26.89)     Time: [id number] PT Time Calculation (min) (ACUTE ONLY): 24 min  Charges:  $Gait Training: 8-22 mins $Therapeutic Activity: 8-22 mins                     Rica Koyanagi  PTA Acute  Rehabilitation Services Pager      [phone] Office      [phone]

## 2019-08-18 NOTE — Progress Notes (Signed)
PHARMACY - TOTAL PARENTERAL NUTRITION CONSULT NOTE   Indication: Prolonged ileus  Patient Measurements: Height: 5\' 6"  (167.6 cm) Weight: 95.4 kg (210 lb 5.1 oz) IBW/kg (Calculated) : 59.3 TPN AdjBW (KG): 69.1 Body mass index is 33.95 kg/m. Usual Weight: 102 kg  Assessment:  Patient admitted on 08/04/19 for diverticulitis with microperforation and robotic sigmoidectomy performed.  Anastomotic leak developed and laparoscopy with wash out, drain placement, and ileostomy creation performed on 08/11/19.  TPN consult requested in anticipation of prolonged ileus.  Glucose / Insulin: No hx DM. CBGs at goal < 150; 0 units insulin given in past 24h Electrolytes: Phos remains slightly elevated at 4.7 despite being removed from TPN on 5/19.  All other electrolytes WNL. Renal: SCr <1 LFTs / TGs: LFTs WNL (5/17); TG 128 (5/15), 108 (5/17) Prealbumin / albumin: Prealb 5.6 (5/15), 6.4 (5/17), Alb 1.7 Intake / Output; MIVF: 59ml output from drains, no MIVF GI Imaging: 5/12 CT abd pelvis: suspicion for new leakage and likely developing abscesses; 5/18 CT abd pelvis: Similar size and distribution of multiple abdominal and pelvic gas and fluid collections, most consistent with persistent or recurrent abscesses. Increased fluid and decreased gas within the sigmoid mesocolon, adjacent to a surgical drain. Cannot exclude anastomotic leak in this region.  Surgeries / Procedures: 08/04/19 Robotic Sigmoidectomy, 08/11/19 Lap wash out, drain placement, ileostomy creation, 5/19 CT guided drainage of abdominal fluid.  Central access: PICC ordered TPN start date: 08/12/19  Nutritional Goals (per RD recommendation on 5/14): kCal: 2200-2400, Protein: 115-130, Fluid: >2.5 L/day  Goal TPN rate is 100 mL/hr (provides 120 g of protein and 2261 kCal per day)  Current Nutrition:  NPO  Plan:  At 1800:  Continue TPN at 100 mL/hr   Electrolytes in TPN: continue Na 44mEq/L, decrease K+ to 24mEq/L, continue Ca 28mEq/L,  continue increased Mag of 81mEq/L, and remove Phos. Cl:Ac ratio 1:1  Add standard MVI and trace elements to TPN  Continue Sensitive SSI & CBG checks q8h and adjust as needed   MIVF per MD (none currently)  Monitor TPN labs on Mon/Thurs.  Bmet in am.      Netta Cedars, PharmD, BCPS Clinical Pharmacist  08/18/2019,7:48 AM

## 2019-08-18 NOTE — Plan of Care (Signed)
  Problem: Health Behavior/Discharge Planning: Goal: Ability to manage health-related needs will improve Outcome: Progressing   Problem: Clinical Measurements: Goal: Ability to maintain clinical measurements within normal limits will improve Outcome: Progressing Goal: Will remain free from infection Outcome: Progressing Goal: Diagnostic test results will improve Outcome: Progressing Goal: Cardiovascular complication will be avoided Outcome: Progressing   Problem: Activity: Goal: Risk for activity intolerance will decrease Outcome: Progressing   Problem: Nutrition: Goal: Adequate nutrition will be maintained Outcome: Progressing   Problem: Elimination: Goal: Will not experience complications related to bowel motility Outcome: Progressing   Problem: Pain Managment: Goal: General experience of comfort will improve Outcome: Progressing   Problem: Clinical Measurements: Goal: Postoperative complications will be avoided or minimized Outcome: Progressing   Problem: Skin Integrity: Goal: Demonstration of wound healing without infection will improve Outcome: Progressing   Problem: Nutrition Goal: Nutritional status is improving Description: Monitor and assess patient for malnutrition (ex- brittle hair, bruises, dry skin, pale skin and conjunctiva, muscle wasting, smooth red tongue, and disorientation). Collaborate with interdisciplinary team and initiate plan and interventions as ordered.  Monitor patient's weight and dietary intake as ordered or per policy. Utilize nutrition screening tool and intervene per policy. Determine patient's food preferences and provide high-protein, high-caloric foods as appropriate.  Outcome: Progressing

## 2019-08-18 NOTE — Progress Notes (Signed)
Referring Physician(s): [name]  Supervising Physician: Jacqulynn Cadet  Patient Status:  [facility] - In-pt  Chief Complaint: "Abdominal pain"  Subjective:  History of perforated diverticulitis s/p robotic assisted sigmoidectomy with lysis of adhesions in OR 08/04/2019 by Dr. [name]; complicated by anastomotic leak s/p diagnostic laparoscopy with washout, right pelvic drain placement, and ileostomy creation in OR 08/11/2019 by Dr. [name]; further complicated by development of multiple intra-abdominal fluid collections s/p intra-abdominal drain placement x3 in IR 08/17/2019 by Dr. Pascal Lux. Patient awake and alert laying in bed. States her abdominal pain has significantly improved since drain placement- still present but states now manageable with pain medications. Left abdominal drains x3 c/d/i.   Allergies: Sulfamethoxazole-trimethoprim and Sulfa antibiotics  Medications: Prior to Admission medications   Medication Sig Start Date End Date Taking? Authorizing Provider  AUBAGIO 14 MG TABS TAKE 1 TABLET BY MOUTH  DAILY Patient taking differently: Take 14 mg by mouth daily.  02/28/19  Yes Sater, Nanine Means, MD  chlorthalidone (HYGROTON) 25 MG tablet Take 12.5 mg by mouth daily.   Yes [provider]  cholecalciferol (VITAMIN D3) 25 MCG (1000 UT) tablet Take 1,000 Units by mouth daily.   Yes [provider]  ferrous gluconate (FERGON) 324 MG tablet TAKE 1 TABLET(324 MG) BY MOUTH DAILY WITH BREAKFAST Patient taking differently: Take 324 mg by mouth daily with breakfast.  05/18/19  Yes Mansouraty, Telford Nab., MD  folic acid (FOLVITE) 1 MG tablet TAKE 1 TABLET(1 MG) BY MOUTH DAILY Patient taking differently: Take 1 mg by mouth daily.  07/04/19  Yes Mansouraty, Telford Nab., MD  LORazepam (ATIVAN) 1 MG tablet Take 1 mg by mouth 2 (two) times daily as needed for anxiety.  12/15/18  Yes [provider]  omeprazole (PRILOSEC) 40 MG capsule TAKE 1 CAPSULE(40 MG) BY MOUTH  DAILY BEFORE BREAKFAST 08/03/19  Yes Mansouraty, Telford Nab., MD  oxybutynin (DITROPAN) 5 MG tablet TAKE 1 TABLET(5 MG) BY MOUTH TWICE DAILY Patient taking differently: Take 5 mg by mouth 2 (two) times daily.  04/13/19  Yes Sater, Nanine Means, MD  potassium chloride (MICRO-K) 10 MEQ CR capsule Take 10 mEq by mouth every morning. 06/29/19  Yes [provider]  Probiotic Product (PROBIOTIC PO) Take 1 capsule by mouth daily.   Yes [provider]  TRELEGY ELLIPTA 100-62.5-25 MCG/INH AEPB Inhale 1 puff into the lungs daily. 02/10/19  Yes [provider]  acetaminophen (TYLENOL) 325 MG tablet Take 2 tablets (650 mg total) by mouth every 6 (six) hours as needed for mild pain (or Fever >/= 101). 11/05/18   Samuella Cota, MD  cyclobenzaprine (FLEXERIL) 5 MG tablet Take 1 tablet (5 mg total) by mouth at bedtime as needed. Patient taking differently: Take 5 mg by mouth at bedtime as needed for muscle spasms.  12/26/16   Sater, Nanine Means, MD  gabapentin (NEURONTIN) 100 MG capsule Take 100 mg by mouth daily as needed (nerve pain).  05/19/18   [provider]  levonorgestrel (MIRENA) 20 MCG/24HR IUD 1 each by Intrauterine route once. 11/14/15   [provider]  meloxicam (MOBIC) 15 MG tablet Take 1 tablet (15 mg total) by mouth daily. Patient taking differently: Take 15 mg by mouth daily as needed for pain.  12/26/16   Sater, Nanine Means, MD  Polyvinyl Alcohol-Povidone (REFRESH OP) Place 2 drops into both eyes 4 (four) times daily as needed (dryness).    [provider]     Vital Signs: BP 122/78 (BP  Location: Left Arm)   Pulse 99   Temp 97.6 F (36.4 C) (Oral)   Resp 16   Ht 5\' 6"  (1.676 m)   Wt 210 lb 5.1 oz (95.4 kg)   LMP 08/08/2019   SpO2 97%   BMI 33.95 kg/m   Physical Exam Vitals and nursing note reviewed.  Constitutional:      General: She is not in acute distress.    Appearance: Normal appearance.  Pulmonary:     Effort: Pulmonary effort is  normal. No respiratory distress.  Abdominal:     Comments: (+) ileostomy. (+) right pelvic surgical drain. Left abdominal drain sites x3 without tenderness, erythema, drainage, or active bleeding; drain #2 with approximately 20 cc purulent drainage with debris in suction bulb; drain #3 with approximately 20 cc purulent drainage with debris in suction bulb; drain #4 with approximately 15 cc purulent drainage with debris in suction bulb; all 3 drains flush/aspirate without resistance.  Skin:    General: Skin is warm and dry.  Neurological:     Mental Status: She is alert and oriented to person, place, and time.  Psychiatric:        Mood and Affect: Mood normal.        Behavior: Behavior normal.     Imaging: CT ABDOMEN PELVIS W CONTRAST  Result Date: 08/16/2019 CLINICAL DATA:  Status post colon resection 08/11/2019. Diagnostic laparoscopy with drain placement and loop ileostomy creation 08/11/2019. Elevated white blood cell count. Right upper quadrant pain. EXAM: CT ABDOMEN AND PELVIS WITH CONTRAST TECHNIQUE: Multidetector CT imaging of the abdomen and pelvis was performed using the standard protocol following bolus administration of intravenous contrast. CONTRAST:  167mL OMNIPAQUE IOHEXOL 300 MG/ML  SOLN COMPARISON:  08/10/2019 FINDINGS: Lower chest: Right base collapse/consolidative change is similar. Slight improvement in left base aeration with airspace disease remaining. Small right pleural effusion is similar. Trace left pleural fluid is decreased. Upper normal sized right cardiophrenic angle nodes. Hepatobiliary: Prominent caudate and lateral segment left liver lobes. No focal liver lesion. Cholecystectomy, without biliary ductal dilatation. Pancreas: Normal, without mass or ductal dilatation. Spleen: Normal in size, without focal abnormality. Adrenals/Urinary Tract: Normal right adrenal gland. A left adrenal 1.6 cm nodule has been described back to 04/21/2016 where it was low-density,  consistent with an adenoma. Normal kidneys, without hydronephrosis. Air within the urinary bladder. Stomach/Bowel: Normal stomach, without wall thickening. Surgical changes about the sigmoid. Residual scattered colonic diverticula. Right abdominal loop ileostomy, new since the prior. Normal small bowel caliber. A right lower quadrant surgical drain has been placed in the interval. This terminates just cephalad to the sigmoid surgical sutures. -Left anterior abdominal gas and fluid collection measures 5.8 x 4.3 cm on 37/2 and is similar to 5.9 x 4.3 cm on the prior exam (when remeasured). -Just central and caudal to this, gas and fluid collection measures 4.6 x 4.5 cm on 36/2 and is similar to on the prior exam (when remeasured). -A central upper pelvic gas and fluid collection measures 4.7 x 3.6 cm on 49/2. Compare 4.6 x 3.7 cm on the prior exam (when remeasured). -Decreased gas with increased fluid within the sigmoid mesocolon, positioned just anterior to the distal portion of the right lower quadrant drain. Example 73/2. -Other smaller peripherally enhancing fluid collections are identified and are relatively similar in size and distribution to the prior. Vascular/Lymphatic: Normal caliber of the aorta and branch vessels. Prominent but not pathologically sized abdominal retroperitoneal nodes are likely reactive. No pelvic sidewall adenopathy. Reproductive:  Persistent anasarca. Other: None. Musculoskeletal: Degenerate disc disease at L3-4. IMPRESSION: 1. Interval placement of a right lower quadrant loop ileostomy. 2. Similar size and distribution of multiple abdominal and pelvic gas and fluid collections, most consistent with persistent or recurrent abscesses. Increased fluid and decreased gas within the sigmoid mesocolon, adjacent to a surgical drain. Cannot exclude anastomotic leak in this region. 3. Right greater than left pleural effusions with bibasilar atelectasis and/or infection. 4. Air within the urinary  bladder. Correlate with instrumentation. If no recent instrumentation, fistulous communication would be a consideration. 5. Prominent caudate and lateral segment left liver lobes. Correlate with risk factors for mild cirrhosis. 6. Left adrenal adenoma. Electronically Signed   By: Abigail Miyamoto M.D.   On: 08/16/2019 15:57   CT IMAGE GUIDED FLUID DRAIN BY CATHETER  Result Date: 08/17/2019 INDICATION: Post robotic assisted sigmoidectomy with lysis of adhesions on 08/04/2019 performed secondary to history of previous complicated diverticulitis (note, history of previous drainage catheter placement on 11/02/2018 with subsequent removal following negative drain injection). Patient subsequently underwent an additional laparoscopy with washout, drain placement end ileostomy creation on 08/11/2019 secondary to an anastomotic leak. CT imaging performed 08/16/2019 demonstrates indeterminate fluid collections within the mid hemiabdomen. As such, request made for CT-guided aspiration/drainage catheter placement for infection source control purposes. EXAM: CT-GUIDED ABDOMINAL ABSCESS DRAINAGE CATHETER PLACEMENT X3 COMPARISON:  CT abdomen pelvis-08/16/2019 MEDICATIONS: The patient is currently admitted to the hospital and receiving intravenous antibiotics. The antibiotics were administered within an appropriate time frame prior to the initiation of the procedure. ANESTHESIA/SEDATION: Moderate (conscious) sedation was employed during this procedure. A total of Versed 4 mg and Fentanyl 200 mcg was administered intravenously. Moderate Sedation Time: 30 minutes. The patient's level of consciousness and vital signs were monitored continuously by radiology nursing throughout the procedure under my direct supervision. CONTRAST:  None COMPLICATIONS: None immediate. PROCEDURE: Informed written consent was obtained from the patient after a discussion of the risks, benefits and alternatives to treatment. The patient was placed supine on  the CT gantry and a pre procedural CT was performed re-demonstrating the known abscess/fluid collections within the abdomen with dominant air and fluid collection within the ventral aspect of the mid abdomen measuring approximately 4.7 x 4.5 cm (image 56, series 2), additional fluid collection within the ventral aspect of the left mid hemiabdomen measuring approximately 5.8 x 4.2 cm (image 61, series 2) and additional collection within the more caudal aspect the midline of the ventral abdomen measuring approximately 4.9 x 3.4 cm (image 75, series 2). The procedure was planned. A timeout was performed prior to the initiation of the procedure. The skin overlying mid and left ventral abdomen was prepped and draped in the usual sterile fashion. The overlying soft tissues were anesthetized with 1% lidocaine with epinephrine. Each collection was targeted separately with an 18 gauge trocar needle allowing advancement of short Amplatz wires into all collections. Appropriate positioning was confirmed with a limited CT scan. The tracks were serially dilated allowing placement of a 10 [place] all-purpose drainage catheters. Appropriate positioning was confirmed with a limited postprocedural CT scan. A total of approximately 100 cc of purulent appearing fluid was aspirated. All drainage catheters were connected to JP bulbs and secured in place within interrupted suture and a Stat Lock device. Dressings were applied. The patient tolerated the above procedures well without immediate postprocedural complication. IMPRESSION: Successful CT guided placement of a 10 French all purpose drain catheter into the 3 separate air in fluid containing collections within ventral  aspect of the mid and left abdomen yielding a total of approximately 100 cc of purulent appearing fluid. A representative sample of aspirated fluid was capped and sent to the laboratory for analysis. PLAN: - Flush each percutaneous drainage catheter with 10 cc of saline  b.i.d. - Maintain diligent records regarding daily drainage catheter output - Once the drainage output from at least 2 of the 3 drainage catheters is less than 10 cc per day (excluding flush volumes), repeat IV only CT scan of the abdomen pelvis could be performed as indicated. Drainage catheter injection prior to potential drain removal may be performed at the discretion of the attending interventional radiologist. Electronically Signed   By: Sandi Mariscal M.D.   On: 08/17/2019 15:47   CT IMAGE GUIDED FLUID DRAIN BY CATHETER  Result Date: 08/17/2019 INDICATION: Post robotic assisted sigmoidectomy with lysis of adhesions on 08/04/2019 performed secondary to history of previous complicated diverticulitis (note, history of previous drainage catheter placement on 11/02/2018 with subsequent removal following negative drain injection). Patient subsequently underwent an additional laparoscopy with washout, drain placement end ileostomy creation on 08/11/2019 secondary to an anastomotic leak. CT imaging performed 08/16/2019 demonstrates indeterminate fluid collections within the mid hemiabdomen. As such, request made for CT-guided aspiration/drainage catheter placement for infection source control purposes. EXAM: CT-GUIDED ABDOMINAL ABSCESS DRAINAGE CATHETER PLACEMENT X3 COMPARISON:  CT abdomen pelvis-08/16/2019 MEDICATIONS: The patient is currently admitted to the hospital and receiving intravenous antibiotics. The antibiotics were administered within an appropriate time frame prior to the initiation of the procedure. ANESTHESIA/SEDATION: Moderate (conscious) sedation was employed during this procedure. A total of Versed 4 mg and Fentanyl 200 mcg was administered intravenously. Moderate Sedation Time: 30 minutes. The patient's level of consciousness and vital signs were monitored continuously by radiology nursing throughout the procedure under my direct supervision. CONTRAST:  None COMPLICATIONS: None immediate.  PROCEDURE: Informed written consent was obtained from the patient after a discussion of the risks, benefits and alternatives to treatment. The patient was placed supine on the CT gantry and a pre procedural CT was performed re-demonstrating the known abscess/fluid collections within the abdomen with dominant air and fluid collection within the ventral aspect of the mid abdomen measuring approximately 4.7 x 4.5 cm (image 56, series 2), additional fluid collection within the ventral aspect of the left mid hemiabdomen measuring approximately 5.8 x 4.2 cm (image 61, series 2) and additional collection within the more caudal aspect the midline of the ventral abdomen measuring approximately 4.9 x 3.4 cm (image 75, series 2). The procedure was planned. A timeout was performed prior to the initiation of the procedure. The skin overlying mid and left ventral abdomen was prepped and draped in the usual sterile fashion. The overlying soft tissues were anesthetized with 1% lidocaine with epinephrine. Each collection was targeted separately with an 18 gauge trocar needle allowing advancement of short Amplatz wires into all collections. Appropriate positioning was confirmed with a limited CT scan. The tracks were serially dilated allowing placement of a 10 [place] all-purpose drainage catheters. Appropriate positioning was confirmed with a limited postprocedural CT scan. A total of approximately 100 cc of purulent appearing fluid was aspirated. All drainage catheters were connected to JP bulbs and secured in place within interrupted suture and a Stat Lock device. Dressings were applied. The patient tolerated the above procedures well without immediate postprocedural complication. IMPRESSION: Successful CT guided placement of a 10 French all purpose drain catheter into the 3 separate air in fluid containing collections within ventral aspect of  the mid and left abdomen yielding a total of approximately 100 cc of purulent appearing  fluid. A representative sample of aspirated fluid was capped and sent to the laboratory for analysis. PLAN: - Flush each percutaneous drainage catheter with 10 cc of saline b.i.d. - Maintain diligent records regarding daily drainage catheter output - Once the drainage output from at least 2 of the 3 drainage catheters is less than 10 cc per day (excluding flush volumes), repeat IV only CT scan of the abdomen pelvis could be performed as indicated. Drainage catheter injection prior to potential drain removal may be performed at the discretion of the attending interventional radiologist. Electronically Signed   By: Sandi Mariscal M.D.   On: 08/17/2019 15:47   CT IMAGE GUIDED DRAINAGE BY PERCUTANEOUS CATHETER  Result Date: 08/17/2019 INDICATION: Post robotic assisted sigmoidectomy with lysis of adhesions on 08/04/2019 performed secondary to history of previous complicated diverticulitis (note, history of previous drainage catheter placement on 11/02/2018 with subsequent removal following negative drain injection). Patient subsequently underwent an additional laparoscopy with washout, drain placement end ileostomy creation on 08/11/2019 secondary to an anastomotic leak. CT imaging performed 08/16/2019 demonstrates indeterminate fluid collections within the mid hemiabdomen. As such, request made for CT-guided aspiration/drainage catheter placement for infection source control purposes. EXAM: CT-GUIDED ABDOMINAL ABSCESS DRAINAGE CATHETER PLACEMENT X3 COMPARISON:  CT abdomen pelvis-08/16/2019 MEDICATIONS: The patient is currently admitted to the hospital and receiving intravenous antibiotics. The antibiotics were administered within an appropriate time frame prior to the initiation of the procedure. ANESTHESIA/SEDATION: Moderate (conscious) sedation was employed during this procedure. A total of Versed 4 mg and Fentanyl 200 mcg was administered intravenously. Moderate Sedation Time: 30 minutes. The patient's level of  consciousness and vital signs were monitored continuously by radiology nursing throughout the procedure under my direct supervision. CONTRAST:  None COMPLICATIONS: None immediate. PROCEDURE: Informed written consent was obtained from the patient after a discussion of the risks, benefits and alternatives to treatment. The patient was placed supine on the CT gantry and a pre procedural CT was performed re-demonstrating the known abscess/fluid collections within the abdomen with dominant air and fluid collection within the ventral aspect of the mid abdomen measuring approximately 4.7 x 4.5 cm (image 56, series 2), additional fluid collection within the ventral aspect of the left mid hemiabdomen measuring approximately 5.8 x 4.2 cm (image 61, series 2) and additional collection within the more caudal aspect the midline of the ventral abdomen measuring approximately 4.9 x 3.4 cm (image 75, series 2). The procedure was planned. A timeout was performed prior to the initiation of the procedure. The skin overlying mid and left ventral abdomen was prepped and draped in the usual sterile fashion. The overlying soft tissues were anesthetized with 1% lidocaine with epinephrine. Each collection was targeted separately with an 18 gauge trocar needle allowing advancement of short Amplatz wires into all collections. Appropriate positioning was confirmed with a limited CT scan. The tracks were serially dilated allowing placement of a 10 [place] all-purpose drainage catheters. Appropriate positioning was confirmed with a limited postprocedural CT scan. A total of approximately 100 cc of purulent appearing fluid was aspirated. All drainage catheters were connected to JP bulbs and secured in place within interrupted suture and a Stat Lock device. Dressings were applied. The patient tolerated the above procedures well without immediate postprocedural complication. IMPRESSION: Successful CT guided placement of a 10 French all purpose drain  catheter into the 3 separate air in fluid containing collections within ventral aspect of the  mid and left abdomen yielding a total of approximately 100 cc of purulent appearing fluid. A representative sample of aspirated fluid was capped and sent to the laboratory for analysis. PLAN: - Flush each percutaneous drainage catheter with 10 cc of saline b.i.d. - Maintain diligent records regarding daily drainage catheter output - Once the drainage output from at least 2 of the 3 drainage catheters is less than 10 cc per day (excluding flush volumes), repeat IV only CT scan of the abdomen pelvis could be performed as indicated. Drainage catheter injection prior to potential drain removal may be performed at the discretion of the attending interventional radiologist. Electronically Signed   By: Sandi Mariscal M.D.   On: 08/17/2019 15:47    Labs:  CBC: Recent Labs    08/13/19 0333 08/15/19 0301 08/16/19 0312 08/18/19 0332  WBC 13.6* 15.5* 18.0* 14.2*  HGB 8.2* 7.7* 7.5* 7.4*  HCT 26.5* 24.7* 23.9* 24.2*  PLT 414* 450* 481* 619*    COAGS: Recent Labs    11/01/18 1531 08/17/19 1040  INR 1.5* 1.1    BMP: Recent Labs    08/15/19 0301 08/16/19 0312 08/17/19 0338 08/18/19 0332  NA 139 135 136 136  K 3.7 4.3 4.4 4.0  CL 100 101 101 103  CO2 28 25 25 26   GLUCOSE 126* 124* 129* 133*  BUN 7 12 14 18   CALCIUM 7.6* 8.0* 8.2* 8.3*  CREATININE 0.49 0.56 0.66 0.57  GFRNONAA >60 >60 >60 >60  GFRAA >60 >60 >60 >60    LIVER FUNCTION TESTS: Recent Labs    08/13/19 0333 08/14/19 0346 08/15/19 0301 08/18/19 0332  BILITOT 0.6 1.0 1.0 1.0  AST 18 20 22 30   ALT 12 11 13 19   ALKPHOS 77 76 81 86  PROT 4.9* 5.1* 5.3* 6.0*  ALBUMIN 1.7* 1.7* 1.7* 1.8*    Assessment and Plan:  History of perforated diverticulitis s/p robotic assisted sigmoidectomy with lysis of adhesions in OR 08/04/2019 by Dr. [name]; complicated by anastomotic leak s/p diagnostic laparoscopy with washout, right pelvic drain  placement, and ileostomy creation in OR 08/11/2019 by Dr. [name]; further complicated by development of multiple intra-abdominal fluid collections s/p intra-abdominal drain placement x3 in IR 08/17/2019 by Dr. Pascal Lux. Left abdominal drain (#2) stable with approximately 20 cc purulent drainage with debris in suction bulb (additional 15 cc output from drain in past 24 hours per chart). Left abdominal drain (#3) stable with approximately 20 cc purulent drainage with debris in suction bulb (additional 15 cc output from drain in past 24 hours per chart). Left abdominal drain (#4) stable with approximately 15 cc purulent drainage with debris in suction bulb (additional 20 cc output from drain in past 24 hours per chart). Continue current drain management- continue with BID flushes/monitor of output. Plan for repeat CT/possible drain injection when output <10 cc/day (assess for possible removal). Further plans per [facility]- appreciate and agree with management. IR to follow.   Electronically Signed: Earley Abide, PA-C 08/18/2019, 9:28 AM   I spent a total of 25 Minutes at the the patient's bedside AND on the patient's hospital floor or unit, greater than 50% of which was counseling/coordinating care for intraabdominal drain placement x3.

## 2019-08-19 LAB — PHOSPHORUS: Phosphorus: 4.3 mg/dL (ref 2.5–4.6)

## 2019-08-19 LAB — BASIC METABOLIC PANEL
Anion gap: 8 (ref 5–15)
BUN: 23 mg/dL — ABNORMAL HIGH (ref 6–20)
CO2: 24 mmol/L (ref 22–32)
Calcium: 8 mg/dL — ABNORMAL LOW (ref 8.9–10.3)
Chloride: 105 mmol/L (ref 98–111)
Creatinine, Ser: 0.65 mg/dL (ref 0.44–1.00)
GFR calc Af Amer: >60 mL/min (ref >60)
GFR calc non Af Amer: >60 mL/min (ref >60)
Glucose, Bld: 131 mg/dL — ABNORMAL HIGH (ref 70–99)
Potassium: 4.3 mmol/L (ref 3.5–5.1)
Sodium: 137 mmol/L (ref 135–145)

## 2019-08-19 LAB — GLUCOSE, CAPILLARY
Glucose-Capillary: 113 mg/dL — ABNORMAL HIGH (ref 70–99)
Glucose-Capillary: 118 mg/dL — ABNORMAL HIGH (ref 70–99)
Glucose-Capillary: 127 mg/dL — ABNORMAL HIGH (ref 70–99)
Glucose-Capillary: 140 mg/dL — ABNORMAL HIGH (ref 70–99)

## 2019-08-19 MED ORDER — TRAVASOL 10 % IV SOLN
INTRAVENOUS | Status: AC
  Filled 2019-08-19: qty 1200

## 2019-08-19 NOTE — Progress Notes (Signed)
Patient ID: Sandra Bonilla, female   DOB: Jun 16, 1975, 44 y.o.   MRN: [id number]    8 Days Post-Op  Subjective: Patient feeling ok.  Feels TNA makes her fuller, but is taking in her full liquids well with no nausea.  Pain seems well controlled.   ROS: See above, otherwise other systems negative  Objective: Vital signs in last 24 hours: Temp:  [97.5 F (36.4 C)-98.4 F (36.9 C)] 97.8 F (36.6 C) (05/21 0609) Pulse Rate:  [99-106] 100 (05/21 0609) Resp:  [14-18] 16 (05/21 0609) BP: (120-129)/(75-77) 126/77 (05/21 0609) SpO2:  [95 %-98 %] 95 % (05/21 0609) Last BM Date: 08/19/19  Intake/Output from previous day: 05/20 0701 - 05/21 0700 In: 3072.1 [P.O.:480; I.V.:2416.6; IV Piggyback:135.5] Out: 965 [Urine:725; Drains:40; Stool:200] Intake/Output this shift: Total I/O In: -  Out: 75 [Stool:75]  PE: Abd: soft, RLQ ileostomy with some stool present in bag, stoma viable.  Surgical JP with minimal darker brown thin output.  All 3 IR drains with seropurulent output.  Appropriately tender.  Lab Results:  Recent Labs    08/18/19 0332  WBC 14.2*  HGB 7.4*  HCT 24.2*  PLT 619*   BMET Recent Labs    08/18/19 0332 08/19/19 0429  NA 136 137  K 4.0 4.3  CL 103 105  CO2 26 24  GLUCOSE 133* 131*  BUN 18 23*  CREATININE 0.57 0.65  CALCIUM 8.3* 8.0*   PT/INR Recent Labs    08/17/19 1040  LABPROT 14.1  INR 1.1   CMP     Component Value Date/Time   NA 137 08/19/2019 0429   NA 141 02/05/2016 1533   K 4.3 08/19/2019 0429   CL 105 08/19/2019 0429   CO2 24 08/19/2019 0429   GLUCOSE 131 (H) 08/19/2019 0429   BUN 23 (H) 08/19/2019 0429   BUN 9 02/05/2016 1533   CREATININE 0.65 08/19/2019 0429   CALCIUM 8.0 (L) 08/19/2019 0429   PROT 6.0 (L) 08/18/2019 0332   PROT 6.6 12/30/2017 0905   ALBUMIN 1.8 (L) 08/18/2019 0332   ALBUMIN 4.2 12/30/2017 0905   AST 30 08/18/2019 0332   ALT 19 08/18/2019 0332   ALKPHOS 86 08/18/2019 0332   BILITOT 1.0 08/18/2019 0332   BILITOT  0.7 12/30/2017 0905   GFRNONAA >60 08/19/2019 0429   GFRAA >60 08/19/2019 0429   Lipase     Component Value Date/Time   LIPASE 25 10/27/2018 1223       Studies/Results: CT IMAGE GUIDED FLUID DRAIN BY CATHETER  Result Date: 08/17/2019 INDICATION: Post robotic assisted sigmoidectomy with lysis of adhesions on 08/04/2019 performed secondary to history of previous complicated diverticulitis (note, history of previous drainage catheter placement on 11/02/2018 with subsequent removal following negative drain injection). Patient subsequently underwent an additional laparoscopy with washout, drain placement end ileostomy creation on 08/11/2019 secondary to an anastomotic leak. CT imaging performed 08/16/2019 demonstrates indeterminate fluid collections within the mid hemiabdomen. As such, request made for CT-guided aspiration/drainage catheter placement for infection source control purposes. EXAM: CT-GUIDED ABDOMINAL ABSCESS DRAINAGE CATHETER PLACEMENT X3 COMPARISON:  CT abdomen pelvis-08/16/2019 MEDICATIONS: The patient is currently admitted to the hospital and receiving intravenous antibiotics. The antibiotics were administered within an appropriate time frame prior to the initiation of the procedure. ANESTHESIA/SEDATION: Moderate (conscious) sedation was employed during this procedure. A total of Versed 4 mg and Fentanyl 200 mcg was administered intravenously. Moderate Sedation Time: 30 minutes. The patient's level of consciousness and vital signs were monitored continuously by radiology  nursing throughout the procedure under my direct supervision. CONTRAST:  None COMPLICATIONS: None immediate. PROCEDURE: Informed written consent was obtained from the patient after a discussion of the risks, benefits and alternatives to treatment. The patient was placed supine on the CT gantry and a pre procedural CT was performed re-demonstrating the known abscess/fluid collections within the abdomen with dominant air  and fluid collection within the ventral aspect of the mid abdomen measuring approximately 4.7 x 4.5 cm (image 56, series 2), additional fluid collection within the ventral aspect of the left mid hemiabdomen measuring approximately 5.8 x 4.2 cm (image 61, series 2) and additional collection within the more caudal aspect the midline of the ventral abdomen measuring approximately 4.9 x 3.4 cm (image 75, series 2). The procedure was planned. A timeout was performed prior to the initiation of the procedure. The skin overlying mid and left ventral abdomen was prepped and draped in the usual sterile fashion. The overlying soft tissues were anesthetized with 1% lidocaine with epinephrine. Each collection was targeted separately with an 18 gauge trocar needle allowing advancement of short Amplatz wires into all collections. Appropriate positioning was confirmed with a limited CT scan. The tracks were serially dilated allowing placement of a 10 [place] all-purpose drainage catheters. Appropriate positioning was confirmed with a limited postprocedural CT scan. A total of approximately 100 cc of purulent appearing fluid was aspirated. All drainage catheters were connected to JP bulbs and secured in place within interrupted suture and a Stat Lock device. Dressings were applied. The patient tolerated the above procedures well without immediate postprocedural complication. IMPRESSION: Successful CT guided placement of a 10 French all purpose drain catheter into the 3 separate air in fluid containing collections within ventral aspect of the mid and left abdomen yielding a total of approximately 100 cc of purulent appearing fluid. A representative sample of aspirated fluid was capped and sent to the laboratory for analysis. PLAN: - Flush each percutaneous drainage catheter with 10 cc of saline b.i.d. - Maintain diligent records regarding daily drainage catheter output - Once the drainage output from at least 2 of the 3 drainage  catheters is less than 10 cc per day (excluding flush volumes), repeat IV only CT scan of the abdomen pelvis could be performed as indicated. Drainage catheter injection prior to potential drain removal may be performed at the discretion of the attending interventional radiologist. Electronically Signed   By: Sandi Mariscal M.D.   On: 08/17/2019 15:47   CT IMAGE GUIDED FLUID DRAIN BY CATHETER  Result Date: 08/17/2019 INDICATION: Post robotic assisted sigmoidectomy with lysis of adhesions on 08/04/2019 performed secondary to history of previous complicated diverticulitis (note, history of previous drainage catheter placement on 11/02/2018 with subsequent removal following negative drain injection). Patient subsequently underwent an additional laparoscopy with washout, drain placement end ileostomy creation on 08/11/2019 secondary to an anastomotic leak. CT imaging performed 08/16/2019 demonstrates indeterminate fluid collections within the mid hemiabdomen. As such, request made for CT-guided aspiration/drainage catheter placement for infection source control purposes. EXAM: CT-GUIDED ABDOMINAL ABSCESS DRAINAGE CATHETER PLACEMENT X3 COMPARISON:  CT abdomen pelvis-08/16/2019 MEDICATIONS: The patient is currently admitted to the hospital and receiving intravenous antibiotics. The antibiotics were administered within an appropriate time frame prior to the initiation of the procedure. ANESTHESIA/SEDATION: Moderate (conscious) sedation was employed during this procedure. A total of Versed 4 mg and Fentanyl 200 mcg was administered intravenously. Moderate Sedation Time: 30 minutes. The patient's level of consciousness and vital signs were monitored continuously by radiology nursing throughout  the procedure under my direct supervision. CONTRAST:  None COMPLICATIONS: None immediate. PROCEDURE: Informed written consent was obtained from the patient after a discussion of the risks, benefits and alternatives to treatment. The  patient was placed supine on the CT gantry and a pre procedural CT was performed re-demonstrating the known abscess/fluid collections within the abdomen with dominant air and fluid collection within the ventral aspect of the mid abdomen measuring approximately 4.7 x 4.5 cm (image 56, series 2), additional fluid collection within the ventral aspect of the left mid hemiabdomen measuring approximately 5.8 x 4.2 cm (image 61, series 2) and additional collection within the more caudal aspect the midline of the ventral abdomen measuring approximately 4.9 x 3.4 cm (image 75, series 2). The procedure was planned. A timeout was performed prior to the initiation of the procedure. The skin overlying mid and left ventral abdomen was prepped and draped in the usual sterile fashion. The overlying soft tissues were anesthetized with 1% lidocaine with epinephrine. Each collection was targeted separately with an 18 gauge trocar needle allowing advancement of short Amplatz wires into all collections. Appropriate positioning was confirmed with a limited CT scan. The tracks were serially dilated allowing placement of a 10 [place] all-purpose drainage catheters. Appropriate positioning was confirmed with a limited postprocedural CT scan. A total of approximately 100 cc of purulent appearing fluid was aspirated. All drainage catheters were connected to JP bulbs and secured in place within interrupted suture and a Stat Lock device. Dressings were applied. The patient tolerated the above procedures well without immediate postprocedural complication. IMPRESSION: Successful CT guided placement of a 10 French all purpose drain catheter into the 3 separate air in fluid containing collections within ventral aspect of the mid and left abdomen yielding a total of approximately 100 cc of purulent appearing fluid. A representative sample of aspirated fluid was capped and sent to the laboratory for analysis. PLAN: - Flush each percutaneous drainage  catheter with 10 cc of saline b.i.d. - Maintain diligent records regarding daily drainage catheter output - Once the drainage output from at least 2 of the 3 drainage catheters is less than 10 cc per day (excluding flush volumes), repeat IV only CT scan of the abdomen pelvis could be performed as indicated. Drainage catheter injection prior to potential drain removal may be performed at the discretion of the attending interventional radiologist. Electronically Signed   By: Sandi Mariscal M.D.   On: 08/17/2019 15:47   CT IMAGE GUIDED DRAINAGE BY PERCUTANEOUS CATHETER  Result Date: 08/17/2019 INDICATION: Post robotic assisted sigmoidectomy with lysis of adhesions on 08/04/2019 performed secondary to history of previous complicated diverticulitis (note, history of previous drainage catheter placement on 11/02/2018 with subsequent removal following negative drain injection). Patient subsequently underwent an additional laparoscopy with washout, drain placement end ileostomy creation on 08/11/2019 secondary to an anastomotic leak. CT imaging performed 08/16/2019 demonstrates indeterminate fluid collections within the mid hemiabdomen. As such, request made for CT-guided aspiration/drainage catheter placement for infection source control purposes. EXAM: CT-GUIDED ABDOMINAL ABSCESS DRAINAGE CATHETER PLACEMENT X3 COMPARISON:  CT abdomen pelvis-08/16/2019 MEDICATIONS: The patient is currently admitted to the hospital and receiving intravenous antibiotics. The antibiotics were administered within an appropriate time frame prior to the initiation of the procedure. ANESTHESIA/SEDATION: Moderate (conscious) sedation was employed during this procedure. A total of Versed 4 mg and Fentanyl 200 mcg was administered intravenously. Moderate Sedation Time: 30 minutes. The patient's level of consciousness and vital signs were monitored continuously by radiology nursing throughout the procedure  under my direct supervision. CONTRAST:   None COMPLICATIONS: None immediate. PROCEDURE: Informed written consent was obtained from the patient after a discussion of the risks, benefits and alternatives to treatment. The patient was placed supine on the CT gantry and a pre procedural CT was performed re-demonstrating the known abscess/fluid collections within the abdomen with dominant air and fluid collection within the ventral aspect of the mid abdomen measuring approximately 4.7 x 4.5 cm (image 56, series 2), additional fluid collection within the ventral aspect of the left mid hemiabdomen measuring approximately 5.8 x 4.2 cm (image 61, series 2) and additional collection within the more caudal aspect the midline of the ventral abdomen measuring approximately 4.9 x 3.4 cm (image 75, series 2). The procedure was planned. A timeout was performed prior to the initiation of the procedure. The skin overlying mid and left ventral abdomen was prepped and draped in the usual sterile fashion. The overlying soft tissues were anesthetized with 1% lidocaine with epinephrine. Each collection was targeted separately with an 18 gauge trocar needle allowing advancement of short Amplatz wires into all collections. Appropriate positioning was confirmed with a limited CT scan. The tracks were serially dilated allowing placement of a 10 [place] all-purpose drainage catheters. Appropriate positioning was confirmed with a limited postprocedural CT scan. A total of approximately 100 cc of purulent appearing fluid was aspirated. All drainage catheters were connected to JP bulbs and secured in place within interrupted suture and a Stat Lock device. Dressings were applied. The patient tolerated the above procedures well without immediate postprocedural complication. IMPRESSION: Successful CT guided placement of a 10 French all purpose drain catheter into the 3 separate air in fluid containing collections within ventral aspect of the mid and left abdomen yielding a total of  approximately 100 cc of purulent appearing fluid. A representative sample of aspirated fluid was capped and sent to the laboratory for analysis. PLAN: - Flush each percutaneous drainage catheter with 10 cc of saline b.i.d. - Maintain diligent records regarding daily drainage catheter output - Once the drainage output from at least 2 of the 3 drainage catheters is less than 10 cc per day (excluding flush volumes), repeat IV only CT scan of the abdomen pelvis could be performed as indicated. Drainage catheter injection prior to potential drain removal may be performed at the discretion of the attending interventional radiologist. Electronically Signed   By: Sandi Mariscal M.D.   On: 08/17/2019 15:47    Anti-infectives: Anti-infectives (From admission, onward)   Start     Dose/Rate Route Frequency Ordered Stop   08/07/19 0830  piperacillin-tazobactam (ZOSYN) IVPB 3.375 g     3.375 g 12.5 mL/hr over 240 Minutes Intravenous Every 8 hours 08/07/19 0803     08/04/19 2000  cefoTEtan (CEFOTAN) 2 g in sodium chloride 0.9 % 100 mL IVPB     2 g 200 mL/hr over 30 Minutes Intravenous Every 12 hours 08/04/19 1204 08/04/19 2107   08/04/19 0615  cefoTEtan (CEFOTAN) 2 g in sodium chloride 0.9 % 100 mL IVPB     2 g 200 mL/hr over 30 Minutes Intravenous On call to O.R. 08/04/19 0604 08/04/19 1639       Assessment/Plan s/p Procedure(s): XI ROBOT ASSISTED SIGMOIDECTOMY FIREFLY INJECTION     Patient Active Problem List   Diagnosis Date Noted  . Diverticular disease 08/04/2019  . 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 Absolute anemia 06/25/2019  . Change in bowel habits 06/25/2019  . IUD threads lost 01/26/2019  .  History of abnormal cervical Pap smear 01/25/2019  . Hx of diverticulitis of colon 12/24/2018  . Dyspepsia 12/24/2018  . Non-intractable vomiting 12/24/2018  . Abnormal CT scan, pelvis 12/24/2018  . Urinary dysfunction 12/30/2017  . Varicose veins of bilateral lower extremities with pain  10/28/2017  . Low grade squamous intraepithelial lesion (LGSIL) on cervical Pap smear 10/29/2016  . 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 Head injury 06/25/2016  . Essential hypertension 04/22/2016  . Diverticulitis of large intestine with complication   . Multiple sclerosis ([place])   . Diverticulitis 04/21/2016  . Other headache syndrome 02/05/2016  . Vitamin D deficiency 10/09/2015  . Other fatigue 10/09/2015  . Dysesthesia 04/11/2015  . Depression with anxiety 04/11/2015  . Gait disturbance 12/13/2014  . Insomnia 12/13/2014  . Multiple sclerosis, relapsing-remitting ([place]) 08/30/2012  . Relapsing remitting multiple sclerosis ([place]) 08/30/2012   Anastomotic leak, s/p washout, drainage and diversion S/p IR drain placement x 3 on 5/19  -cont all drains -on FLD and tolerating.  May be able to try soft diet and try to wean TNA.  Will D/W Dr. Dema Severin regarding plan -cont IV abx therapy -mobilize -WBC trending down, recheck in am    LOS: 15 days    Henreitta Cea , [facility] 08/19/2019, 9:36 AM Please see Amion for pager number during day hours 7:00am-4:30pm or 7:00am -11:30am on weekends

## 2019-08-19 NOTE — Progress Notes (Signed)
PHARMACY - TOTAL PARENTERAL NUTRITION CONSULT NOTE   Indication: Prolonged ileus  Patient Measurements: Height: 5\' 6"  (167.6 cm) Weight: 95.4 kg (210 lb 5.1 oz) IBW/kg (Calculated) : 59.3 TPN AdjBW (KG): 69.1 Body mass index is 33.95 kg/m. Usual Weight: 102 kg  Assessment:  Patient admitted on 08/04/19 for diverticulitis with microperforation and robotic sigmoidectomy performed.  Anastomotic leak developed and laparoscopy with wash out, drain placement, and ileostomy creation performed on 08/11/19.  TPN consult requested in anticipation of prolonged ileus.  Glucose / Insulin: No hx DM. CBGs at goal < 150; 3 units insulin given in past 24h Electrolytes: Phos now WNL after being removed from TPN on 5/19.  All other electrolytes WNL. Renal: SCr <1 LFTs / TGs: LFTs WNL (5/17); TG 128 (5/15), 108 (5/17) Prealbumin / albumin: Prealb 5.6 (5/15), 6.4 (5/17), Alb 1.7 Intake / Output; MIVF: 46ml output from drains, no MIVF GI Imaging: 5/12 CT abd pelvis: suspicion for new leakage and likely developing abscesses; 5/18 CT abd pelvis: Similar size and distribution of multiple abdominal and pelvic gas and fluid collections, most consistent with persistent or recurrent abscesses. Increased fluid and decreased gas within the sigmoid mesocolon, adjacent to a surgical drain. Cannot exclude anastomotic leak in this region.  Surgeries / Procedures: 08/04/19 Robotic Sigmoidectomy, 08/11/19 Lap wash out, drain placement, ileostomy creation, 5/19 CT guided drainage of abdominal fluid.  Central access: PICC ordered TPN start date: 08/12/19  Nutritional Goals (per RD recommendation on 5/14): kCal: 2200-2400, Protein: 115-130, Fluid: >2.5 L/day  Goal TPN rate is 100 mL/hr (provides 120 g of protein and 2261 kCal per day)  Current Nutrition:  Full liquid diet with Magic Cup BID -   Plan:  At 1800:  Continue TPN at 100 mL/hr   Electrolytes in TPN: continue Na 2mEq/L, decrease K+ to 28mEq/L, continue Ca  46mEq/L, continue increased Mag of 77mEq/L, and remove Phos. Cl:Ac ratio 1:1  Add standard MVI and trace elements to TPN  Continue Sensitive SSI & CBG checks q8h and adjust as needed   MIVF per MD (none currently)  Monitor TPN labs on Mon/Thurs.      Netta Cedars, PharmD, BCPS Clinical Pharmacist  08/19/2019,7:14 AM

## 2019-08-19 NOTE — Progress Notes (Signed)
Patient able to empty own ostomy bag

## 2019-08-19 NOTE — Progress Notes (Signed)
Referring Physician(s): [place]  Supervising Physician: Daryll Brod  Patient Status:  [facility] - In-pt  Chief Complaint:  Abdominal pain/fluid collections  Subjective: Pt feeling better since abd drains placed; reports less shoulder pain and less abd fullness; denies N/V   Allergies: Sulfamethoxazole-trimethoprim and Sulfa antibiotics  Medications: Prior to Admission medications   Medication Sig Start Date End Date Taking? Authorizing Provider  AUBAGIO 14 MG TABS TAKE 1 TABLET BY MOUTH  DAILY Patient taking differently: Take 14 mg by mouth daily.  02/28/19  Yes Sater, Nanine Means, MD  chlorthalidone (HYGROTON) 25 MG tablet Take 12.5 mg by mouth daily.   Yes [provider]  cholecalciferol (VITAMIN D3) 25 MCG (1000 UT) tablet Take 1,000 Units by mouth daily.   Yes [provider]  ferrous gluconate (FERGON) 324 MG tablet TAKE 1 TABLET(324 MG) BY MOUTH DAILY WITH BREAKFAST Patient taking differently: Take 324 mg by mouth daily with breakfast.  05/18/19  Yes Mansouraty, Telford Nab., MD  folic acid (FOLVITE) 1 MG tablet TAKE 1 TABLET(1 MG) BY MOUTH DAILY Patient taking differently: Take 1 mg by mouth daily.  07/04/19  Yes Mansouraty, Telford Nab., MD  LORazepam (ATIVAN) 1 MG tablet Take 1 mg by mouth 2 (two) times daily as needed for anxiety.  12/15/18  Yes [provider]  omeprazole (PRILOSEC) 40 MG capsule TAKE 1 CAPSULE(40 MG) BY MOUTH DAILY BEFORE BREAKFAST 08/03/19  Yes Mansouraty, Telford Nab., MD  oxybutynin (DITROPAN) 5 MG tablet TAKE 1 TABLET(5 MG) BY MOUTH TWICE DAILY Patient taking differently: Take 5 mg by mouth 2 (two) times daily.  04/13/19  Yes Sater, Nanine Means, MD  potassium chloride (MICRO-K) 10 MEQ CR capsule Take 10 mEq by mouth every morning. 06/29/19  Yes [provider]  Probiotic Product (PROBIOTIC PO) Take 1 capsule by mouth daily.   Yes [provider]  TRELEGY ELLIPTA 100-62.5-25 MCG/INH AEPB Inhale 1 puff into the lungs  daily. 02/10/19  Yes [provider]  acetaminophen (TYLENOL) 325 MG tablet Take 2 tablets (650 mg total) by mouth every 6 (six) hours as needed for mild pain (or Fever >/= 101). 11/05/18   Samuella Cota, MD  cyclobenzaprine (FLEXERIL) 5 MG tablet Take 1 tablet (5 mg total) by mouth at bedtime as needed. Patient taking differently: Take 5 mg by mouth at bedtime as needed for muscle spasms.  12/26/16   Sater, Nanine Means, MD  gabapentin (NEURONTIN) 100 MG capsule Take 100 mg by mouth daily as needed (nerve pain).  05/19/18   [provider]  levonorgestrel (MIRENA) 20 MCG/24HR IUD 1 each by Intrauterine route once. 11/14/15   [provider]  meloxicam (MOBIC) 15 MG tablet Take 1 tablet (15 mg total) by mouth daily. Patient taking differently: Take 15 mg by mouth daily as needed for pain.  12/26/16   Sater, Nanine Means, MD  Polyvinyl Alcohol-Povidone (REFRESH OP) Place 2 drops into both eyes 4 (four) times daily as needed (dryness).    [provider]     Vital Signs: BP 126/77 (BP Location: Left Arm)   Pulse 100   Temp 97.8 F (36.6 C) (Oral)   Resp 16   Ht 5\' 6"  (1.676 m)   Wt 210 lb 5.1 oz (95.4 kg)   LMP 08/08/2019   SpO2 95%   BMI 33.95 kg/m   Physical Exam awake/alert; left IR drains intact, dressings dry, sites minimally tender, OP between 10-15 cc each sl turbid beige colored fluid  Imaging:  CT ABDOMEN PELVIS W CONTRAST  Result Date: 08/16/2019 CLINICAL DATA:  Status post colon resection 08/11/2019. Diagnostic laparoscopy with drain placement and loop ileostomy creation 08/11/2019. Elevated white blood cell count. Right upper quadrant pain. EXAM: CT ABDOMEN AND PELVIS WITH CONTRAST TECHNIQUE: Multidetector CT imaging of the abdomen and pelvis was performed using the standard protocol following bolus administration of intravenous contrast. CONTRAST:  127mL OMNIPAQUE IOHEXOL 300 MG/ML  SOLN COMPARISON:  08/10/2019 FINDINGS: Lower chest: Right base  collapse/consolidative change is similar. Slight improvement in left base aeration with airspace disease remaining. Small right pleural effusion is similar. Trace left pleural fluid is decreased. Upper normal sized right cardiophrenic angle nodes. Hepatobiliary: Prominent caudate and lateral segment left liver lobes. No focal liver lesion. Cholecystectomy, without biliary ductal dilatation. Pancreas: Normal, without mass or ductal dilatation. Spleen: Normal in size, without focal abnormality. Adrenals/Urinary Tract: Normal right adrenal gland. A left adrenal 1.6 cm nodule has been described back to 04/21/2016 where it was low-density, consistent with an adenoma. Normal kidneys, without hydronephrosis. Air within the urinary bladder. Stomach/Bowel: Normal stomach, without wall thickening. Surgical changes about the sigmoid. Residual scattered colonic diverticula. Right abdominal loop ileostomy, new since the prior. Normal small bowel caliber. A right lower quadrant surgical drain has been placed in the interval. This terminates just cephalad to the sigmoid surgical sutures. -Left anterior abdominal gas and fluid collection measures 5.8 x 4.3 cm on 37/2 and is similar to 5.9 x 4.3 cm on the prior exam (when remeasured). -Just central and caudal to this, gas and fluid collection measures 4.6 x 4.5 cm on 36/2 and is similar to on the prior exam (when remeasured). -A central upper pelvic gas and fluid collection measures 4.7 x 3.6 cm on 49/2. Compare 4.6 x 3.7 cm on the prior exam (when remeasured). -Decreased gas with increased fluid within the sigmoid mesocolon, positioned just anterior to the distal portion of the right lower quadrant drain. Example 73/2. -Other smaller peripherally enhancing fluid collections are identified and are relatively similar in size and distribution to the prior. Vascular/Lymphatic: Normal caliber of the aorta and branch vessels. Prominent but not pathologically sized abdominal  retroperitoneal nodes are likely reactive. No pelvic sidewall adenopathy. Reproductive: Persistent anasarca. Other: None. Musculoskeletal: Degenerate disc disease at L3-4. IMPRESSION: 1. Interval placement of a right lower quadrant loop ileostomy. 2. Similar size and distribution of multiple abdominal and pelvic gas and fluid collections, most consistent with persistent or recurrent abscesses. Increased fluid and decreased gas within the sigmoid mesocolon, adjacent to a surgical drain. Cannot exclude anastomotic leak in this region. 3. Right greater than left pleural effusions with bibasilar atelectasis and/or infection. 4. Air within the urinary bladder. Correlate with instrumentation. If no recent instrumentation, fistulous communication would be a consideration. 5. Prominent caudate and lateral segment left liver lobes. Correlate with risk factors for mild cirrhosis. 6. Left adrenal adenoma. Electronically Signed   By: Abigail Miyamoto M.D.   On: 08/16/2019 15:57   CT IMAGE GUIDED FLUID DRAIN BY CATHETER  Result Date: 08/17/2019 INDICATION: Post robotic assisted sigmoidectomy with lysis of adhesions on 08/04/2019 performed secondary to history of previous complicated diverticulitis (note, history of previous drainage catheter placement on 11/02/2018 with subsequent removal following negative drain injection). Patient subsequently underwent an additional laparoscopy with washout, drain placement end ileostomy creation on 08/11/2019 secondary to an anastomotic leak. CT imaging performed 08/16/2019 demonstrates indeterminate fluid collections within the mid hemiabdomen. As such, request made for CT-guided aspiration/drainage catheter placement for infection source  control purposes. EXAM: CT-GUIDED ABDOMINAL ABSCESS DRAINAGE CATHETER PLACEMENT X3 COMPARISON:  CT abdomen pelvis-08/16/2019 MEDICATIONS: The patient is currently admitted to the hospital and receiving intravenous antibiotics. The antibiotics were  administered within an appropriate time frame prior to the initiation of the procedure. ANESTHESIA/SEDATION: Moderate (conscious) sedation was employed during this procedure. A total of Versed 4 mg and Fentanyl 200 mcg was administered intravenously. Moderate Sedation Time: 30 minutes. The patient's level of consciousness and vital signs were monitored continuously by radiology nursing throughout the procedure under my direct supervision. CONTRAST:  None COMPLICATIONS: None immediate. PROCEDURE: Informed written consent was obtained from the patient after a discussion of the risks, benefits and alternatives to treatment. The patient was placed supine on the CT gantry and a pre procedural CT was performed re-demonstrating the known abscess/fluid collections within the abdomen with dominant air and fluid collection within the ventral aspect of the mid abdomen measuring approximately 4.7 x 4.5 cm (image 56, series 2), additional fluid collection within the ventral aspect of the left mid hemiabdomen measuring approximately 5.8 x 4.2 cm (image 61, series 2) and additional collection within the more caudal aspect the midline of the ventral abdomen measuring approximately 4.9 x 3.4 cm (image 75, series 2). The procedure was planned. A timeout was performed prior to the initiation of the procedure. The skin overlying mid and left ventral abdomen was prepped and draped in the usual sterile fashion. The overlying soft tissues were anesthetized with 1% lidocaine with epinephrine. Each collection was targeted separately with an 18 gauge trocar needle allowing advancement of short Amplatz wires into all collections. Appropriate positioning was confirmed with a limited CT scan. The tracks were serially dilated allowing placement of a 10 [place] all-purpose drainage catheters. Appropriate positioning was confirmed with a limited postprocedural CT scan. A total of approximately 100 cc of purulent appearing fluid was aspirated. All  drainage catheters were connected to JP bulbs and secured in place within interrupted suture and a Stat Lock device. Dressings were applied. The patient tolerated the above procedures well without immediate postprocedural complication. IMPRESSION: Successful CT guided placement of a 10 French all purpose drain catheter into the 3 separate air in fluid containing collections within ventral aspect of the mid and left abdomen yielding a total of approximately 100 cc of purulent appearing fluid. A representative sample of aspirated fluid was capped and sent to the laboratory for analysis. PLAN: - Flush each percutaneous drainage catheter with 10 cc of saline b.i.d. - Maintain diligent records regarding daily drainage catheter output - Once the drainage output from at least 2 of the 3 drainage catheters is less than 10 cc per day (excluding flush volumes), repeat IV only CT scan of the abdomen pelvis could be performed as indicated. Drainage catheter injection prior to potential drain removal may be performed at the discretion of the attending interventional radiologist. Electronically Signed   By: Sandi Mariscal M.D.   On: 08/17/2019 15:47   CT IMAGE GUIDED FLUID DRAIN BY CATHETER  Result Date: 08/17/2019 INDICATION: Post robotic assisted sigmoidectomy with lysis of adhesions on 08/04/2019 performed secondary to history of previous complicated diverticulitis (note, history of previous drainage catheter placement on 11/02/2018 with subsequent removal following negative drain injection). Patient subsequently underwent an additional laparoscopy with washout, drain placement end ileostomy creation on 08/11/2019 secondary to an anastomotic leak. CT imaging performed 08/16/2019 demonstrates indeterminate fluid collections within the mid hemiabdomen. As such, request made for CT-guided aspiration/drainage catheter placement for infection source control purposes.  EXAM: CT-GUIDED ABDOMINAL ABSCESS DRAINAGE CATHETER PLACEMENT X3  COMPARISON:  CT abdomen pelvis-08/16/2019 MEDICATIONS: The patient is currently admitted to the hospital and receiving intravenous antibiotics. The antibiotics were administered within an appropriate time frame prior to the initiation of the procedure. ANESTHESIA/SEDATION: Moderate (conscious) sedation was employed during this procedure. A total of Versed 4 mg and Fentanyl 200 mcg was administered intravenously. Moderate Sedation Time: 30 minutes. The patient's level of consciousness and vital signs were monitored continuously by radiology nursing throughout the procedure under my direct supervision. CONTRAST:  None COMPLICATIONS: None immediate. PROCEDURE: Informed written consent was obtained from the patient after a discussion of the risks, benefits and alternatives to treatment. The patient was placed supine on the CT gantry and a pre procedural CT was performed re-demonstrating the known abscess/fluid collections within the abdomen with dominant air and fluid collection within the ventral aspect of the mid abdomen measuring approximately 4.7 x 4.5 cm (image 56, series 2), additional fluid collection within the ventral aspect of the left mid hemiabdomen measuring approximately 5.8 x 4.2 cm (image 61, series 2) and additional collection within the more caudal aspect the midline of the ventral abdomen measuring approximately 4.9 x 3.4 cm (image 75, series 2). The procedure was planned. A timeout was performed prior to the initiation of the procedure. The skin overlying mid and left ventral abdomen was prepped and draped in the usual sterile fashion. The overlying soft tissues were anesthetized with 1% lidocaine with epinephrine. Each collection was targeted separately with an 18 gauge trocar needle allowing advancement of short Amplatz wires into all collections. Appropriate positioning was confirmed with a limited CT scan. The tracks were serially dilated allowing placement of a 10 [place] all-purpose drainage  catheters. Appropriate positioning was confirmed with a limited postprocedural CT scan. A total of approximately 100 cc of purulent appearing fluid was aspirated. All drainage catheters were connected to JP bulbs and secured in place within interrupted suture and a Stat Lock device. Dressings were applied. The patient tolerated the above procedures well without immediate postprocedural complication. IMPRESSION: Successful CT guided placement of a 10 French all purpose drain catheter into the 3 separate air in fluid containing collections within ventral aspect of the mid and left abdomen yielding a total of approximately 100 cc of purulent appearing fluid. A representative sample of aspirated fluid was capped and sent to the laboratory for analysis. PLAN: - Flush each percutaneous drainage catheter with 10 cc of saline b.i.d. - Maintain diligent records regarding daily drainage catheter output - Once the drainage output from at least 2 of the 3 drainage catheters is less than 10 cc per day (excluding flush volumes), repeat IV only CT scan of the abdomen pelvis could be performed as indicated. Drainage catheter injection prior to potential drain removal may be performed at the discretion of the attending interventional radiologist. Electronically Signed   By: Sandi Mariscal M.D.   On: 08/17/2019 15:47   CT IMAGE GUIDED DRAINAGE BY PERCUTANEOUS CATHETER  Result Date: 08/17/2019 INDICATION: Post robotic assisted sigmoidectomy with lysis of adhesions on 08/04/2019 performed secondary to history of previous complicated diverticulitis (note, history of previous drainage catheter placement on 11/02/2018 with subsequent removal following negative drain injection). Patient subsequently underwent an additional laparoscopy with washout, drain placement end ileostomy creation on 08/11/2019 secondary to an anastomotic leak. CT imaging performed 08/16/2019 demonstrates indeterminate fluid collections within the mid hemiabdomen.  As such, request made for CT-guided aspiration/drainage catheter placement for infection source control purposes. EXAM:  CT-GUIDED ABDOMINAL ABSCESS DRAINAGE CATHETER PLACEMENT X3 COMPARISON:  CT abdomen pelvis-08/16/2019 MEDICATIONS: The patient is currently admitted to the hospital and receiving intravenous antibiotics. The antibiotics were administered within an appropriate time frame prior to the initiation of the procedure. ANESTHESIA/SEDATION: Moderate (conscious) sedation was employed during this procedure. A total of Versed 4 mg and Fentanyl 200 mcg was administered intravenously. Moderate Sedation Time: 30 minutes. The patient's level of consciousness and vital signs were monitored continuously by radiology nursing throughout the procedure under my direct supervision. CONTRAST:  None COMPLICATIONS: None immediate. PROCEDURE: Informed written consent was obtained from the patient after a discussion of the risks, benefits and alternatives to treatment. The patient was placed supine on the CT gantry and a pre procedural CT was performed re-demonstrating the known abscess/fluid collections within the abdomen with dominant air and fluid collection within the ventral aspect of the mid abdomen measuring approximately 4.7 x 4.5 cm (image 56, series 2), additional fluid collection within the ventral aspect of the left mid hemiabdomen measuring approximately 5.8 x 4.2 cm (image 61, series 2) and additional collection within the more caudal aspect the midline of the ventral abdomen measuring approximately 4.9 x 3.4 cm (image 75, series 2). The procedure was planned. A timeout was performed prior to the initiation of the procedure. The skin overlying mid and left ventral abdomen was prepped and draped in the usual sterile fashion. The overlying soft tissues were anesthetized with 1% lidocaine with epinephrine. Each collection was targeted separately with an 18 gauge trocar needle allowing advancement of short Amplatz  wires into all collections. Appropriate positioning was confirmed with a limited CT scan. The tracks were serially dilated allowing placement of a 10 [place] all-purpose drainage catheters. Appropriate positioning was confirmed with a limited postprocedural CT scan. A total of approximately 100 cc of purulent appearing fluid was aspirated. All drainage catheters were connected to JP bulbs and secured in place within interrupted suture and a Stat Lock device. Dressings were applied. The patient tolerated the above procedures well without immediate postprocedural complication. IMPRESSION: Successful CT guided placement of a 10 French all purpose drain catheter into the 3 separate air in fluid containing collections within ventral aspect of the mid and left abdomen yielding a total of approximately 100 cc of purulent appearing fluid. A representative sample of aspirated fluid was capped and sent to the laboratory for analysis. PLAN: - Flush each percutaneous drainage catheter with 10 cc of saline b.i.d. - Maintain diligent records regarding daily drainage catheter output - Once the drainage output from at least 2 of the 3 drainage catheters is less than 10 cc per day (excluding flush volumes), repeat IV only CT scan of the abdomen pelvis could be performed as indicated. Drainage catheter injection prior to potential drain removal may be performed at the discretion of the attending interventional radiologist. Electronically Signed   By: Sandi Mariscal M.D.   On: 08/17/2019 15:47    Labs:  CBC: Recent Labs    08/13/19 0333 08/15/19 0301 08/16/19 0312 08/18/19 0332  WBC 13.6* 15.5* 18.0* 14.2*  HGB 8.2* 7.7* 7.5* 7.4*  HCT 26.5* 24.7* 23.9* 24.2*  PLT 414* 450* 481* 619*    COAGS: Recent Labs    11/01/18 1531 08/17/19 1040  INR 1.5* 1.1    BMP: Recent Labs    08/16/19 0312 08/17/19 0338 08/18/19 0332 08/19/19 0429  NA 135 136 136 137  K 4.3 4.4 4.0 4.3  CL 101 101 103 105  CO2 25  25 26 24     GLUCOSE 124* 129* 133* 131*  BUN 12 14 18  23*  CALCIUM 8.0* 8.2* 8.3* 8.0*  CREATININE 0.56 0.66 0.57 0.65  GFRNONAA >60 >60 >60 >60  GFRAA >60 >60 >60 >60    LIVER FUNCTION TESTS: Recent Labs    08/13/19 0333 08/14/19 0346 08/15/19 0301 08/18/19 0332  BILITOT 0.6 1.0 1.0 1.0  AST 18 20 22 30   ALT 12 11 13 19   ALKPHOS 77 76 81 86  PROT 4.9* 5.1* 5.3* 6.0*  ALBUMIN 1.7* 1.7* 1.7* 1.8*    Assessment and Plan: 44 yo female with prior hx of diverticular/ pelvic abscess drainage on 11/02/2018 with subsequent drain removal following negative fistulogram on 11/16/2018. She is status post robot-assisted sigmoidectomy with lysis of adhesions on 08/04/2019 secondary to complicated diverticulitis.  Secondary to anastomotic leak patient underwent laparoscopy with washout, drain placement and ileostomy creation on 08/11/2019; now s/p placement of 3 left abdominal fluid collection drains on 5/19; afebrile; last WBC 14.2(18), hgb 7.4(7.5), creat nl;cx- few yeast; cont current tx; likely check f/u CT next week or sooner if clinical status worsens   Electronically Signed: D. Rowe Robert, PA-C 08/19/2019, 10:09 AM   I spent a total of 15 minutes at the the patient's bedside AND on the patient's hospital floor or unit, greater than 50% of which was counseling/coordinating care for abdominal fluid collection drains    Patient ID: Sandra Bonilla, female   DOB: [birth year], 44 y.o.   MRN: [id number]

## 2019-08-19 NOTE — Consult Note (Addendum)
[facility] Nurse ostomy follow up Stoma type/location: Patient reports leakage on Thursday, during night.Pouch has undermining of stool noted today. Patient states that no skin barrier ring was used. Stomal assessment/size: 1 and 1/2 inch loop ileostomy in RMQ. Crease crosses most distal portion of stoma Peristomal assessment: Intact. Treatment options for stomal/peristomal skin: Two (2) skin barrier rings, belt Output: brown/green liquid effluent.  Ostomy pouching: 2pc. Pouching system with 2 skin barrier rings and belt Education provided:  Is emptying with minimal staff assistance. Rationale for skin barrier rings provided. Supplies provided to room, 4 skin barriers, 4 rings, 4 pouches. Belt. Enrolled patient in [place] Start Discharge program: Yes, previously  [facility] nursing team will follow, and will remain available to this patient, the nursing and medical teams.   Thanks, Maudie Flakes, MSN, RN, [place], Arther Abbott  Pager# [phone]

## 2019-08-20 ENCOUNTER — Encounter (HOSPITAL_COMMUNITY): Payer: Self-pay | Admitting: General Surgery

## 2019-08-20 DIAGNOSIS — D62 Acute posthemorrhagic anemia: Secondary | ICD-10-CM

## 2019-08-20 DIAGNOSIS — Z932 Ileostomy status: Secondary | ICD-10-CM

## 2019-08-20 LAB — CBC
HCT: 23.3 % — ABNORMAL LOW (ref 36.0–46.0)
Hemoglobin: 7.1 g/dL — ABNORMAL LOW (ref 12.0–15.0)
MCH: 26.1 pg (ref 26.0–34.0)
MCHC: 30.5 g/dL (ref 30.0–36.0)
MCV: 85.7 fL (ref 80.0–100.0)
Platelets: 736 10*3/uL — ABNORMAL HIGH (ref 150–400)
RBC: 2.72 MIL/uL — ABNORMAL LOW (ref 3.87–5.11)
RDW: 17.5 % — ABNORMAL HIGH (ref 11.5–15.5)
WBC: 15 10*3/uL — ABNORMAL HIGH (ref 4.0–10.5)
nRBC: 0 % (ref 0.0–0.2)

## 2019-08-20 LAB — GLUCOSE, CAPILLARY
Glucose-Capillary: 118 mg/dL — ABNORMAL HIGH (ref 70–99)
Glucose-Capillary: 121 mg/dL — ABNORMAL HIGH (ref 70–99)
Glucose-Capillary: 123 mg/dL — ABNORMAL HIGH (ref 70–99)

## 2019-08-20 MED ORDER — SODIUM CHLORIDE 0.9 % IV SOLN
25.0000 mg | Freq: Once | INTRAVENOUS | Status: AC
  Administered 2019-08-20: 25 mg via INTRAVENOUS
  Filled 2019-08-20: qty 0.5

## 2019-08-20 MED ORDER — SODIUM CHLORIDE 0.9 % IV SOLN
500.0000 mg | Freq: Once | INTRAVENOUS | Status: AC
  Administered 2019-08-20: 500 mg via INTRAVENOUS
  Filled 2019-08-20: qty 10

## 2019-08-20 MED ORDER — SODIUM CHLORIDE 0.9 % IV SOLN
200.0000 mg | Freq: Once | INTRAVENOUS | Status: AC
  Administered 2019-08-20: 200 mg via INTRAVENOUS
  Filled 2019-08-20: qty 200

## 2019-08-20 MED ORDER — TRAVASOL 10 % IV SOLN
INTRAVENOUS | Status: AC
  Filled 2019-08-20: qty 1200

## 2019-08-20 MED ORDER — SODIUM CHLORIDE 0.9 % IV SOLN
100.0000 mg | INTRAVENOUS | Status: DC
  Administered 2019-08-21 – 2019-08-26 (×6): 100 mg via INTRAVENOUS
  Filled 2019-08-20 (×8): qty 100

## 2019-08-20 MED ORDER — FERROUS SULFATE 325 (65 FE) MG PO TABS
325.0000 mg | ORAL_TABLET | Freq: Two times a day (BID) | ORAL | Status: DC
  Administered 2019-08-20 – 2019-08-29 (×18): 325 mg via ORAL
  Filled 2019-08-20 (×18): qty 1

## 2019-08-20 NOTE — Progress Notes (Signed)
Sandra Bonilla [id number] 08-16-1975  CARE TEAM:  PCP: [name], MD  Outpatient Care Team: Patient Care Team: [name], MD as PCP - General (Internal Medicine) [name], MD as Consulting Physician (Colon and Rectal Surgery)  Inpatient Treatment Team: Treatment Team: Attending Provider: [name], MD; Registered Nurse: Yvette Rack, RN; Consulting Physician: Ileana Roup, MD; [place] Nurse: Tenna Child, RN; Registered Nurse: Franki Monte, RN; Technician: Resa Miner, NT; Rounding Team: Dorthy [facility]er [facility]iology, MD; Consulting Physician: Edison Pace, Md, MD; Technician: Leda Quail, [place]; Registered Nurse: Heloise Ochoa, RN; Registered Nurse: Lyndal Pulley, RN; Technician: Lewanda Rife, NT; Registered Nurse: Sibyl Parr, RN   Problem List:   Active Problems:   Multiple sclerosis [facility])   Depression with anxiety   Diverticulitis of large intestine with perforation and abscess   Anemia of chronic disease   Ileostomy in place [facility])   Postoperative anemia due to acute blood loss   9 Days Post-Op  08/11/2019  PRE-OPERATIVE DIAGNOSIS:  Anastomotic leak    POST-OPERATIVE DIAGNOSIS:  Anastomotic leak  PROCEDURE:  DIAGNOSTIC LAPAROSCOPY WITH [place] OUT, DRAIN PLACEMENT, AND ILEOSTOMY CREATION  Surgeon(s):  [name], MD  [id number]  POST-OPERATIVE DIAGNOSIS:  COMPLICATED DIVERTICULITIS  PROCEDURE:  XI ROBOT ASSISTED SIGMOIDECTOMY INTRAOPERATIVE ASSESSMENT OF PERFUSION LYSIS OF ADHESIONS    Surgeon(s):Thomas, Elmo Putt, MD  Assessment  Ileus resolving in the setting of delayed anastomotic leak status post robotic colectomy for recurrent chronic diverticulitis  [facility] Stay = 16 days)  s/p Procedure(s): XI ROBOT ASSISTED SIGMOIDECTOMY FIREFLY INJECTION     Patient Active Problem List   Diagnosis Date Noted  . Diverticular disease 08/04/2019  . 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 Absolute anemia 06/25/2019  . Change in bowel habits 06/25/2019  . IUD threads lost 01/26/2019  . History of abnormal cervical Pap smear 01/25/2019  . Hx of diverticulitis of colon 12/24/2018  . Dyspepsia 12/24/2018  . Non-intractable vomiting 12/24/2018  . Abnormal CT scan, pelvis 12/24/2018  . Urinary dysfunction 12/30/2017  . Varicose veins of bilateral lower extremities with pain 10/28/2017  . Low grade squamous intraepithelial lesion (LGSIL) on cervical Pap smear 10/29/2016  . 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 Head injury 06/25/2016  . Essential hypertension 04/22/2016  . Diverticulitis of large intestine with complication   . Multiple sclerosis ([place])   . Diverticulitis 04/21/2016  . Other headache syndrome 02/05/2016  . Vitamin D deficiency 10/09/2015  . Other fatigue 10/09/2015  . Dysesthesia 04/11/2015  . Depression with anxiety 04/11/2015  . Gait disturbance 12/13/2014  . Insomnia 12/13/2014  . Multiple sclerosis, relapsing-remitting ([place]) 08/30/2012  . Relapsing remitting multiple sclerosis ([place]) 08/30/2012   Anastomotic leak, s/p washout, drainage and diversion S/p IR drain placement x 3 on 5/19  -cont all drains for now.  Perhaps can do drain studies and start calling a few if output is low and drainage normalizes from purulent  -Try to advance to solid diet  Wean off parenteral nutrition if improved tomorrow  -cont IV abx therapy.  We will add Eraxis since growing yeast in latest cultures.  Await ID and sensitivity before adjusting  -mobilize  -WBC trending down, recheck in am  -Borderline acute on chronic postoperative anemia.  IV iron.  Oral iron to help with ileostomy as well.  Bowel regimen  Ileostomy care and training.  Hopefully can take loop ileostomy down in 3-6 months once gets over current  severe infection and prove that delayed anastomotic leak has healed.  Defer to operating  surgeon, Dr. [name].   25 minutes spent in review, evaluation, examination, counseling, and coordination of care.  More than 50% of that time was spent in counseling.  08/20/2019    Subjective: (Chief complaint)  Patient walking hallways.  Sore but nausea going down.  Objective:  Vital signs:  Vitals:   08/19/19 0609 08/19/19 1413 08/19/19 2132 08/20/19 0648  BP: 126/77 124/72 122/79 134/90  Pulse: 100 (!) 108 (!) 110 (!) 106  Resp: 16 15 18 18   Temp: 97.8 F (36.6 C) 98.6 F (37 C) 97.9 F (36.6 C) 97.8 F (36.6 C)  TempSrc: Oral Oral Oral Oral  SpO2: 95% 98% 96% 95%  Weight:      Height:        Last BM Date: 08/19/19  Intake/Output   Yesterday:  05/21 0701 - 05/22 0700 In: 3618.7 [P.O.:840; I.V.:2631.3; IV Piggyback:147.4] Out: 2085 [Urine:1850; Drains:60; Stool:175] This shift:  No intake/output data recorded.  Bowel function:  Flatus: YES  BM:  YES  Drain: Purulent   Physical Exam:  General: Pt awake/alert in no acute distress Eyes: PERRL, normal EOM.  Sclera clear.  No icterus Neuro: CN II-XII intact w/o focal sensory/motor deficits. Lymph: No head/neck/groin lymphadenopathy Psych:  No delerium/psychosis/paranoia.  Oriented x 4 HENT: Normocephalic, Mucus membranes moist.  No thrush Neck: Supple, No tracheal deviation.  No obvious thyromegaly Chest: No pain to chest wall compression.  Good respiratory excursion.  No audible wheezing CV:  Pulses intact.  Regular rhythm.  No major extremity edema MS: Normal AROM mjr joints.  No obvious deformity  Abdomen: Soft.  Mildy distended.  Tenderness at Percutaneous drain sites.  Loop ileostomy pink with gas and liquid in bag.  No evidence of peritonitis.  No incarcerated hernias.  Ext:  No deformity.  No mjr edema.  No cyanosis Skin: No petechiae / purpurea.  No major sores.  Warm and dry    Results:   Cultures: Recent Results (from the past 720 hour(s))  SARS CORONAVIRUS 2 (TAT 6-24 HRS)  Nasopharyngeal Nasopharyngeal Swab     Status: None   Collection Time: 08/01/19  2:54 PM   Specimen: Nasopharyngeal Swab  Result Value Ref Range Status   SARS Coronavirus 2 NEGATIVE NEGATIVE Final    Comment: (NOTE) SARS-CoV-2 target nucleic acids are NOT DETECTED. The SARS-CoV-2 RNA is generally detectable in upper and lower respiratory specimens during the acute phase of infection. Negative results do not preclude SARS-CoV-2 infection, do not rule out co-infections with other pathogens, and should not be used as the sole basis for treatment or other patient management decisions. Negative results must be combined with clinical observations, patient history, and epidemiological information. The expected result is Negative. Fact Sheet for Patients: SugarRoll.be Fact Sheet for Healthcare Providers: https://www.woods-mathews.com/ This test is not yet approved or cleared by the [place] FDA and  has been authorized for detection and/or diagnosis of SARS-CoV-2 by FDA under an Emergency Use Authorization (EUA). This EUA will remain  in effect (meaning this test can be used) for the duration of the COVID-19 declaration under Section 56 4(b)(1) of the Act, 21 U.S.C. section 360bbb-3(b)(1), unless the authorization is terminated or revoked sooner. Performed at [facility], [place] [address] [place], [place] [address]   Aerobic/Anaerobic Culture (surgical/deep wound)     Status: None (Preliminary result)   Collection Time: 08/17/19  2:30 PM   Specimen: JP Drain;  Abscess  Result Value Ref Range Status   Specimen Description   Final    JP DRAINAGE Performed at [place] [address] [place], [place] [address]    Special Requests   Final    Normal Performed at [facility], [place] [address], [place], [place] [address]    Gram Stain   Final    ABUNDANT WBC PRESENT, PREDOMINANTLY PMN NO ORGANISMS  SEEN Performed at [facility], [place] [address], [place], [place] [address]    Culture   Final    FEW YEAST CULTURE REINCUBATED FOR BETTER GROWTH NO ANAEROBES ISOLATED; CULTURE IN PROGRESS FOR 5 DAYS    Report Status PENDING  Incomplete    Labs: Results for orders placed or performed during the hospital encounter of 08/04/19 (from the past 48 hour(s))  Glucose, capillary     Status: Abnormal   Collection Time: 08/18/19  4:07 PM  Result Value Ref Range   Glucose-Capillary 121 (H) 70 - 99 mg/dL    Comment: Glucose reference range applies only to samples taken after fasting for at least 8 hours.  Glucose, capillary     Status: Abnormal   Collection Time: 08/19/19 12:09 AM  Result Value Ref Range   Glucose-Capillary 127 (H) 70 - 99 mg/dL    Comment: Glucose reference range applies only to samples taken after fasting for at least 8 hours.  Basic metabolic panel     Status: Abnormal   Collection Time: 08/19/19  4:29 AM  Result Value Ref Range   Sodium 137 135 - 145 mmol/L   Potassium 4.3 3.5 - 5.1 mmol/L   Chloride 105 98 - 111 mmol/L   CO2 24 22 - 32 mmol/L   Glucose, Bld 131 (H) 70 - 99 mg/dL    Comment: Glucose reference range applies only to samples taken after fasting for at least 8 hours.   BUN 23 (H) 6 - 20 mg/dL   Creatinine, Ser 0.65 0.44 - 1.00 mg/dL   Calcium 8.0 (L) 8.9 - 10.3 mg/dL   GFR calc non Af Amer >60 >60 mL/min   GFR calc Af Amer >60 >60 mL/min   Anion gap 8 5 - 15    Comment: Performed at [facility], [place] [address], [place], [place] [address]  Phosphorus     Status: None   Collection Time: 08/19/19  4:29 AM  Result Value Ref Range   Phosphorus 4.3 2.5 - 4.6 mg/dL    Comment: Performed at [facility], [place] [address], [place], [place] [address]  Glucose, capillary     Status: Abnormal   Collection Time: 08/19/19  7:47 AM  Result Value Ref Range   Glucose-Capillary 118 (H) 70 - 99 mg/dL    Comment: Glucose  reference range applies only to samples taken after fasting for at least 8 hours.  Glucose, capillary     Status: Abnormal   Collection Time: 08/19/19  4:08 PM  Result Value Ref Range   Glucose-Capillary 113 (H) 70 - 99 mg/dL    Comment: Glucose reference range applies only to samples taken after fasting for at least 8 hours.  Glucose, capillary     Status: Abnormal   Collection Time: 08/19/19 11:32 PM  Result Value Ref Range   Glucose-Capillary 140 (H) 70 - 99 mg/dL    Comment: Glucose reference range applies only to samples taken after fasting for at least 8 hours.  CBC  Status: Abnormal   Collection Time: 08/20/19  4:35 AM  Result Value Ref Range   WBC 15.0 (H) 4.0 - 10.5 K/uL   RBC 2.72 (L) 3.87 - 5.11 MIL/uL   Hemoglobin 7.1 (L) 12.0 - 15.0 g/dL   HCT 23.3 (L) 36.0 - 46.0 %   MCV 85.7 80.0 - 100.0 fL   MCH 26.1 26.0 - 34.0 pg   MCHC 30.5 30.0 - 36.0 g/dL   RDW 17.5 (H) 11.5 - 15.5 %   Platelets 736 (H) 150 - 400 K/uL   nRBC 0.0 0.0 - 0.2 %    Comment: Performed at [facility], [place] [address], [place], [place] [address]  Glucose, capillary     Status: Abnormal   Collection Time: 08/20/19  8:15 AM  Result Value Ref Range   Glucose-Capillary 121 (H) 70 - 99 mg/dL    Comment: Glucose reference range applies only to samples taken after fasting for at least 8 hours.    Imaging / Studies: No results found.  Medications / Allergies: per chart  Antibiotics: Anti-infectives (From admission, onward)   Start     Dose/Rate Route Frequency Ordered Stop   08/07/19 0830  piperacillin-tazobactam (ZOSYN) IVPB 3.375 g     3.375 g 12.5 mL/hr over 240 Minutes Intravenous Every 8 hours 08/07/19 0803     08/04/19 2000  cefoTEtan (CEFOTAN) 2 g in sodium chloride 0.9 % 100 mL IVPB     2 g 200 mL/hr over 30 Minutes Intravenous Every 12 hours 08/04/19 1204 08/04/19 2107   08/04/19 0615  cefoTEtan (CEFOTAN) 2 g in sodium chloride 0.9 % 100 mL IVPB     2 g 200 mL/hr  over 30 Minutes Intravenous On call to O.R. 08/04/19 0604 08/04/19 1639        Note: Portions of this report may have been transcribed using voice recognition software. Every effort was made to ensure accuracy; however, inadvertent computerized transcription errors may be present.   Any transcriptional errors that result from this process are unintentional.    Adin Hector, MD, FACS, MASCRS Gastrointestinal and Minimally Invasive Surgery  [facility] [address] [place], [place] [address] [phone] Fax [phone] Main/Paging  CONTACT INFORMATION: Weekday (9AM-5PM) concerns: Call [facility] main office at [phone] Weeknight (5PM-9AM) or Weekend/Holiday concerns: Check [email or web address] for General Surgery [facility] coverage (Please, do not use SecureChat as it is not reliable communication to surgeons for patient care)      08/20/2019  9:05 AM

## 2019-08-20 NOTE — Progress Notes (Signed)
PHARMACY - TOTAL PARENTERAL NUTRITION CONSULT NOTE   Indication: Prolonged ileus  Patient Measurements: Height: 5\' 6"  (167.6 cm) Weight: 95.4 kg (210 lb 5.1 oz) IBW/kg (Calculated) : 59.3 TPN AdjBW (KG): 69.1 Body mass index is 33.95 kg/m. Usual Weight: 102 kg  Assessment:  Patient admitted on 08/04/19 for diverticulitis with microperforation and robotic sigmoidectomy performed.  Anastomotic leak developed and laparoscopy with wash out, drain placement, and ileostomy creation performed on 08/11/19.  TPN consult requested in anticipation of prolonged ileus.  Glucose / Insulin: No hx DM. CBGs at goal < 150; 2 units insulin given in past 24h Electrolytes: Phos now WNL after being removed from TPN on 5/19.  All other electrolytes WNL. ( 5/21)  Renal: SCr <1 LFTs / TGs: LFTs WNL (5/17); TG 128 (5/15), 108 (5/17) Prealbumin / albumin: Prealb 5.6 (5/15), 6.4 (5/17), Alb 1.7 Intake / Output; MIVF: 42ml output from drains, no MIVF GI Imaging: 5/12 CT abd pelvis: suspicion for new leakage and likely developing abscesses; 5/18 CT abd pelvis: Similar size and distribution of multiple abdominal and pelvic gas and fluid collections, most consistent with persistent or recurrent abscesses. Increased fluid and decreased gas within the sigmoid mesocolon, adjacent to a surgical drain. Cannot exclude anastomotic leak in this region.  Surgeries / Procedures: 08/04/19 Robotic Sigmoidectomy, 08/11/19 Lap wash out, drain placement, ileostomy creation, 5/19 CT guided drainage of abdominal fluid.  Central access: PICC ordered TPN start date: 08/12/19  Nutritional Goals (per RD recommendation on 5/14): kCal: 2200-2400, Protein: 115-130, Fluid: >2.5 L/day  Goal TPN rate is 100 mL/hr (provides 120 g of protein and 2261 kCal per day)  Current Nutrition:  Full liquid diet with Magic Cup BID -   Plan:  At 1800:  Continue TPN at 100 mL/hr   Electrolytes in TPN: continue Na 57mEq/L, decrease K+ to 38mEq/L, continue  Ca 63mEq/L, continue increased Mag of 81mEq/L, and remove Phos. Cl:Ac ratio 1:1  Add standard MVI and trace elements to TPN  Continue Sensitive SSI & CBG checks q8h and adjust as needed   BMP., magnesium, phosphorus with AM labs   MIVF per MD (none currently)  Monitor TPN labs on Mon/Thurs.       Royetta Asal, PharmD, BCPS 08/20/2019 9:48 AM

## 2019-08-21 LAB — MAGNESIUM: Magnesium: 2.2 mg/dL (ref 1.7–2.4)

## 2019-08-21 LAB — BASIC METABOLIC PANEL
Anion gap: 9 (ref 5–15)
BUN: 24 mg/dL — ABNORMAL HIGH (ref 6–20)
CO2: 24 mmol/L (ref 22–32)
Calcium: 7.8 mg/dL — ABNORMAL LOW (ref 8.9–10.3)
Chloride: 107 mmol/L (ref 98–111)
Creatinine, Ser: 0.69 mg/dL (ref 0.44–1.00)
GFR calc Af Amer: >60 mL/min (ref >60)
GFR calc non Af Amer: >60 mL/min (ref >60)
Glucose, Bld: 113 mg/dL — ABNORMAL HIGH (ref 70–99)
Potassium: 4.1 mmol/L (ref 3.5–5.1)
Sodium: 140 mmol/L (ref 135–145)

## 2019-08-21 LAB — GLUCOSE, CAPILLARY
Glucose-Capillary: 115 mg/dL — ABNORMAL HIGH (ref 70–99)
Glucose-Capillary: 83 mg/dL (ref 70–99)
Glucose-Capillary: 79 mg/dL (ref 70–99)

## 2019-08-21 LAB — PHOSPHORUS: Phosphorus: 3.8 mg/dL (ref 2.5–4.6)

## 2019-08-21 MED ORDER — TRAVASOL 10 % IV SOLN
INTRAVENOUS | Status: AC
  Filled 2019-08-21: qty 600

## 2019-08-21 MED ORDER — PSYLLIUM 95 % PO PACK
1.0000 | PACK | Freq: Two times a day (BID) | ORAL | Status: DC
  Administered 2019-08-21 (×2): 1 via ORAL
  Filled 2019-08-21 (×3): qty 1

## 2019-08-21 MED ORDER — GABAPENTIN 400 MG PO CAPS
400.0000 mg | ORAL_CAPSULE | Freq: Three times a day (TID) | ORAL | Status: DC
  Administered 2019-08-21 – 2019-08-29 (×25): 400 mg via ORAL
  Filled 2019-08-21 (×25): qty 1

## 2019-08-21 MED ORDER — OXYCODONE HCL 5 MG PO TABS
5.0000 mg | ORAL_TABLET | ORAL | Status: DC | PRN
  Administered 2019-08-21: 5 mg via ORAL
  Administered 2019-08-22 – 2019-08-28 (×19): 10 mg via ORAL
  Filled 2019-08-21 (×21): qty 2

## 2019-08-21 MED ORDER — HYDROCORTISONE 1 % EX CREA
TOPICAL_CREAM | Freq: Two times a day (BID) | CUTANEOUS | Status: DC
  Filled 2019-08-21: qty 28

## 2019-08-21 MED ORDER — LOPERAMIDE HCL 2 MG PO CAPS
2.0000 mg | ORAL_CAPSULE | Freq: Three times a day (TID) | ORAL | Status: DC | PRN
  Administered 2019-08-21: 2 mg via ORAL
  Filled 2019-08-21: qty 1

## 2019-08-21 NOTE — Progress Notes (Signed)
A consult was placed to IV Therapy; pt had taken a shower, and TNA/fluids had been stopped;  Dressing to RUA  picc line changed after the shower;  Spoke with pharmacy;  TNA not to be re-started until later this evening due to risk of contamination;  RN to monitor pt's blood sugar until next bag of TNA is hung; no need to hang D10 at this time as next bag of TNA will be hung between 5-7pm;

## 2019-08-21 NOTE — Progress Notes (Signed)
Pt MEWS score went yellow today d/t HR elevated to 117 after taking a shower. Also culture was positive for fungi and she has been started on ABX therapy. Her ileostomy putting out liquid stool too. Gave her loperamide and pain meds. She is comfortable and stable at this time. We will continue to monitor and update as needed.

## 2019-08-21 NOTE — Progress Notes (Signed)
[facility] [id number] [birth year]  CARE TEAM:  PCP: [name], MD  Outpatient Care Team: Patient Care Team: [name], MD as PCP - General (Internal Medicine) [name], MD as Consulting Physician (Colon and Rectal Surgery)  Inpatient Treatment Team: Treatment Team: Attending Provider: [name], MD; Registered Nurse: Yvette Rack, RN; Consulting Physician: Ileana Roup, MD; [place] Nurse: Tenna Child, RN; Registered Nurse: Franki Monte, RN; Technician: Resa Miner, NT; Rounding Team: [facility], MD; Consulting Physician: Edison Pace, Md, MD; Technician: Leda Quail, [place]; Registered Nurse: Lyndal Pulley, RN; Technician: Lewanda Rife, NT; Registered Nurse: Sibyl Parr, RN; Registered Nurse: Sunny Schlein, RN; Registered Nurse: Guadlupe Spanish, RN   Problem List:   Principal Problem:   Diverticulitis of large intestine with perforation and abscess Active Problems:   Multiple sclerosis ([place])   Insomnia   Depression with anxiety   Essential hypertension   Anemia of chronic disease   Ileostomy in place [facility])   Postoperative anemia due to acute blood loss   10 Days Post-Op  08/11/2019  PRE-OPERATIVE DIAGNOSIS:  Anastomotic leak    POST-OPERATIVE DIAGNOSIS:  Anastomotic leak  PROCEDURE:  DIAGNOSTIC LAPAROSCOPY WITH [place] OUT, DRAIN PLACEMENT, AND ILEOSTOMY CREATION  Surgeon(s):  [name], MD  [id number]  POST-OPERATIVE DIAGNOSIS:  COMPLICATED DIVERTICULITIS  PROCEDURE:  XI ROBOT ASSISTED SIGMOIDECTOMY INTRAOPERATIVE ASSESSMENT OF PERFUSION LYSIS OF ADHESIONS    Surgeon(s):Thomas, Elmo Putt, MD  Assessment  Ileus resolving in the setting of delayed anastomotic leak status post robotic colectomy for recurrent chronic diverticulitis  [facility] Stay = 17 days)  s/p Procedure(s): XI ROBOT ASSISTED SIGMOIDECTOMY FIREFLY INJECTION     Patient Active Problem  List   Diagnosis Date Noted  . Diverticular disease 08/04/2019  . 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 Absolute anemia 06/25/2019  . Change in bowel habits 06/25/2019  . IUD threads lost 01/26/2019  . History of abnormal cervical Pap smear 01/25/2019  . Hx of diverticulitis of colon 12/24/2018  . Dyspepsia 12/24/2018  . Non-intractable vomiting 12/24/2018  . Abnormal CT scan, pelvis 12/24/2018  . Urinary dysfunction 12/30/2017  . Varicose veins of bilateral lower extremities with pain 10/28/2017  . Low grade squamous intraepithelial lesion (LGSIL) on cervical Pap smear 10/29/2016  . 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 Head injury 06/25/2016  . Essential hypertension 04/22/2016  . Diverticulitis of large intestine with complication   . Multiple sclerosis ([place])   . Diverticulitis 04/21/2016  . Other headache syndrome 02/05/2016  . Vitamin D deficiency 10/09/2015  . Other fatigue 10/09/2015  . Dysesthesia 04/11/2015  . Depression with anxiety 04/11/2015  . Gait disturbance 12/13/2014  . Insomnia 12/13/2014  . Multiple sclerosis, relapsing-remitting ([place]) 08/30/2012  . Relapsing remitting multiple sclerosis ([place]) 08/30/2012   Anastomotic leak, s/p washout, drainage and diversion S/p IR drain placement x 3 on 5/19  -cont all drains for now.  Perhaps can do drain studies and start removing a few if output is low and drainage normalizes from purulent  -Advancing to solid diet  Wean off parenteral nutrition   -cont IV abx therapy.  We will add Eraxis since growing yeast in latest cultures.  Await ID and sensitivity before adjusting  -mobilize  -WBC trending - rechecking   -Borderline acute on chronic postoperative anemia.  IV iron done.  Oral iron .  Bowel regimen.    Ileostomy care and training.  Hopefully can take loop ileostomy  down in 3-6 months once gets over current severe infection and prove that delayed  anastomotic leak has healed.  Defer to operating surgeon, Dr. [name].   25 minutes spent in review, evaluation, examination, counseling, and coordination of care.  More than 50% of that time was spent in counseling.  08/21/2019    Subjective: (Chief complaint)  Patient walking okay.  Less nausea.Trying some solid food  Objective:  Vital signs:  Vitals:   08/20/19 1417 08/20/19 2114 08/21/19 0500 08/21/19 0624  BP: 129/75 129/77  138/78  Pulse: (!) 110 91  (!) 101  Resp: 16 15  16   Temp: 98 F (36.7 C) 97.7 F (36.5 C)  (!) 97.5 F (36.4 C)  TempSrc: Oral Oral  Oral  SpO2: 100% 96%  97%  Weight:   98 kg   Height:        Last BM Date: 08/21/19(ileostomy)  Intake/Output   Yesterday:  05/22 0701 - 05/23 0700 In: 2783.2 [P.O.:370; I.V.:2154.6; IV Piggyback:243.6] Out: 933 [Urine:750; Drains:38; [id number] This shift:  No intake/output data recorded.  Bowel function:  Flatus: YES  BM:  YES  Drain: Purulent   Physical Exam:  General: Pt awake/alert in no acute distress Eyes: PERRL, normal EOM.  Sclera clear.  No icterus Neuro: CN II-XII intact w/o focal sensory/motor deficits. Lymph: No head/neck/groin lymphadenopathy Psych:  No delerium/psychosis/paranoia.  Oriented x 4 HENT: Normocephalic, Mucus membranes moist.  No thrush Neck: Supple, No tracheal deviation.  No obvious thyromegaly Chest: No pain to chest wall compression.  Good respiratory excursion.  No audible wheezing CV:  Pulses intact.  Regular rhythm.  No major extremity edema MS: Normal AROM mjr joints.  No obvious deformity  Abdomen: Soft.  Mildy distended.  Tenderness at Percutaneous drain sites.  Loop ileostomy pink with gas and liquid in bag.  No evidence of peritonitis.  No incarcerated hernias.  Ext:  No deformity.  No mjr edema.  No cyanosis Skin: No petechiae / purpurea.  No major sores.  Warm and dry    Results:   Cultures: Recent Results (from the past 720 hour(s))  SARS  CORONAVIRUS 2 (TAT 6-24 HRS) Nasopharyngeal Nasopharyngeal Swab     Status: None   Collection Time: 08/01/19  2:54 PM   Specimen: Nasopharyngeal Swab  Result Value Ref Range Status   SARS Coronavirus 2 NEGATIVE NEGATIVE Final    Comment: (NOTE) SARS-CoV-2 target nucleic acids are NOT DETECTED. The SARS-CoV-2 RNA is generally detectable in upper and lower respiratory specimens during the acute phase of infection. Negative results do not preclude SARS-CoV-2 infection, do not rule out co-infections with other pathogens, and should not be used as the sole basis for treatment or other patient management decisions. Negative results must be combined with clinical observations, patient history, and epidemiological information. The expected result is Negative. Fact Sheet for Patients: SugarRoll.be Fact Sheet for Healthcare Providers: https://www.woods-mathews.com/ This test is not yet approved or cleared by the [place] FDA and  has been authorized for detection and/or diagnosis of SARS-CoV-2 by FDA under an Emergency Use Authorization (EUA). This EUA will remain  in effect (meaning this test can be used) for the duration of the COVID-19 declaration under Section 56 4(b)(1) of the Act, 21 U.S.C. section 360bbb-3(b)(1), unless the authorization is terminated or revoked sooner. Performed at [facility], [place] [address], [place], [place] [address]   Aerobic/Anaerobic Culture (surgical/deep wound)     Status: None (Preliminary result)   Collection Time: 08/17/19  2:30  PM   Specimen: JP Drain; Abscess  Result Value Ref Range Status   Specimen Description   Final    JP DRAINAGE Performed at [place] [address], [place], [place] [address]    Special Requests   Final    Normal Performed at [facility], [place] [address] [place], [place] [address]    Gram Stain   Final    ABUNDANT WBC PRESENT,  PREDOMINANTLY PMN NO ORGANISMS SEEN Performed at [facility], [place] [address], [place], [place] [address]    Culture   Final    FEW CANDIDA ALBICANS CULTURE REINCUBATED FOR BETTER GROWTH NO ANAEROBES ISOLATED; CULTURE IN PROGRESS FOR 5 DAYS    Report Status PENDING  Incomplete    Labs: Results for orders placed or performed during the hospital encounter of 08/04/19 (from the past 48 hour(s))  Glucose, capillary     Status: Abnormal   Collection Time: 08/19/19  4:08 PM  Result Value Ref Range   Glucose-Capillary 113 (H) 70 - 99 mg/dL    Comment: Glucose reference range applies only to samples taken after fasting for at least 8 hours.  Glucose, capillary     Status: Abnormal   Collection Time: 08/19/19 11:32 PM  Result Value Ref Range   Glucose-Capillary 140 (H) 70 - 99 mg/dL    Comment: Glucose reference range applies only to samples taken after fasting for at least 8 hours.  CBC     Status: Abnormal   Collection Time: 08/20/19  4:35 AM  Result Value Ref Range   WBC 15.0 (H) 4.0 - 10.5 K/uL   RBC 2.72 (L) 3.87 - 5.11 MIL/uL   Hemoglobin 7.1 (L) 12.0 - 15.0 g/dL   HCT 23.3 (L) 36.0 - 46.0 %   MCV 85.7 80.0 - 100.0 fL   MCH 26.1 26.0 - 34.0 pg   MCHC 30.5 30.0 - 36.0 g/dL   RDW 17.5 (H) 11.5 - 15.5 %   Platelets 736 (H) 150 - 400 K/uL   nRBC 0.0 0.0 - 0.2 %    Comment: Performed at [facility], [place] [address], [place], [place] [address]  Glucose, capillary     Status: Abnormal   Collection Time: 08/20/19  8:15 AM  Result Value Ref Range   Glucose-Capillary 121 (H) 70 - 99 mg/dL    Comment: Glucose reference range applies only to samples taken after fasting for at least 8 hours.  Glucose, capillary     Status: Abnormal   Collection Time: 08/20/19  4:27 PM  Result Value Ref Range   Glucose-Capillary 123 (H) 70 - 99 mg/dL    Comment: Glucose reference range applies only to samples taken after fasting for at least 8 hours.  Glucose, capillary      Status: Abnormal   Collection Time: 08/20/19 11:44 PM  Result Value Ref Range   Glucose-Capillary 118 (H) 70 - 99 mg/dL    Comment: Glucose reference range applies only to samples taken after fasting for at least 8 hours.  Basic metabolic panel     Status: Abnormal   Collection Time: 08/21/19  3:35 AM  Result Value Ref Range   Sodium 140 135 - 145 mmol/L   Potassium 4.1 3.5 - 5.1 mmol/L   Chloride 107 98 - 111 mmol/L   CO2 24 22 - 32 mmol/L   Glucose, Bld 113 (H) 70 - 99 mg/dL    Comment: Glucose reference range applies only to  samples taken after fasting for at least 8 hours.   BUN 24 (H) 6 - 20 mg/dL   Creatinine, Ser 0.69 0.44 - 1.00 mg/dL   Calcium 7.8 (L) 8.9 - 10.3 mg/dL   GFR calc non Af Amer >60 >60 mL/min   GFR calc Af Amer >60 >60 mL/min   Anion gap 9 5 - 15    Comment: Performed at [facility], [place] [address], [place], [place] [address]  Magnesium     Status: None   Collection Time: 08/21/19  3:35 AM  Result Value Ref Range   Magnesium 2.2 1.7 - 2.4 mg/dL    Comment: Performed at [facility], [place] [address] [place], [place] [address]  Phosphorus     Status: None   Collection Time: 08/21/19  3:35 AM  Result Value Ref Range   Phosphorus 3.8 2.5 - 4.6 mg/dL    Comment: Performed at [facility], [place] [address], [place],  [address]  Glucose, capillary     Status: Abnormal   Collection Time: 08/21/19  7:42 AM  Result Value Ref Range   Glucose-Capillary 115 (H) 70 - 99 mg/dL    Comment: Glucose reference range applies only to samples taken after fasting for at least 8 hours.    Imaging / Studies: No results found.  Medications / Allergies: per chart  Antibiotics: Anti-infectives (From admission, onward)   Start     Dose/Rate Route Frequency Ordered Stop   08/21/19 1000  anidulafungin (ERAXIS) 100 mg in sodium chloride 0.9 % 100 mL IVPB     100 mg 78 mL/hr over 100 Minutes Intravenous Every 24 hours  08/20/19 0914     08/20/19 1000  anidulafungin (ERAXIS) 200 mg in sodium chloride 0.9 % 200 mL IVPB     200 mg 78 mL/hr over 200 Minutes Intravenous  Once 08/20/19 0933 08/20/19 1756   08/07/19 0830  piperacillin-tazobactam (ZOSYN) IVPB 3.375 g     3.375 g 12.5 mL/hr over 240 Minutes Intravenous Every 8 hours 08/07/19 0803     08/04/19 2000  cefoTEtan (CEFOTAN) 2 g in sodium chloride 0.9 % 100 mL IVPB     2 g 200 mL/hr over 30 Minutes Intravenous Every 12 hours 08/04/19 1204 08/04/19 2107   08/04/19 0615  cefoTEtan (CEFOTAN) 2 g in sodium chloride 0.9 % 100 mL IVPB     2 g 200 mL/hr over 30 Minutes Intravenous On call to O.R. 08/04/19 0604 08/04/19 1639        Note: Portions of this report may have been transcribed using voice recognition software. Every effort was made to ensure accuracy; however, inadvertent computerized transcription errors may be present.   Any transcriptional errors that result from this process are unintentional.    Adin Hector, MD, FACS, MASCRS Gastrointestinal and Minimally Invasive Surgery  [facility] [address], [place],  [address] [phone] Fax [phone] Main/Paging  CONTACT INFORMATION: Weekday (9AM-5PM) concerns: Call [facility] main office at [phone] Weeknight (5PM-9AM) or Weekend/Holiday concerns: Check [email or web address] for General Surgery [facility] coverage (Please, do not use SecureChat as it is not reliable communication to surgeons for patient care)      08/21/2019  8:12 AM

## 2019-08-21 NOTE — Progress Notes (Signed)
PHARMACY - TOTAL PARENTERAL NUTRITION CONSULT NOTE   Indication: Prolonged ileus  Patient Measurements: Height: 5\' 6"  (167.6 cm) Weight: 98 kg (216 lb 0.8 oz) IBW/kg (Calculated) : 59.3 TPN AdjBW (KG): 69.1 Body mass index is 34.87 kg/m. Usual Weight: 102 kg  Assessment:  Patient admitted on 08/04/19 for diverticulitis with microperforation and robotic sigmoidectomy performed.  Anastomotic leak developed and laparoscopy with wash out, drain placement, and ileostomy creation performed on 08/11/19.  TPN consult requested in anticipation of prolonged ileus.  Glucose / Insulin: No hx DM. CBGs at goal < 150; 2 units insulin given in past 24h Electrolytes: Phos now WNL after being removed from TPN on 5/19.  All other electrolytes WNL. ( 5/21) CorrCa is 9.6, WNL  Renal: SCr <1 LFTs / TGs: LFTs WNL (5/17); TG 128 (5/15), 108 (5/17) Prealbumin / albumin: Prealb 5.6 (5/15), 6.4 (5/17), Alb 1.7 Intake / Output; MIVF: 47ml output from drains, no MIVF GI Imaging: 5/12 CT abd pelvis: suspicion for new leakage and likely developing abscesses; 5/18 CT abd pelvis: Similar size and distribution of multiple abdominal and pelvic gas and fluid collections, most consistent with persistent or recurrent abscesses. Increased fluid and decreased gas within the sigmoid mesocolon, adjacent to a surgical drain. Cannot exclude anastomotic leak in this region.  Surgeries / Procedures: 08/04/19 Robotic Sigmoidectomy, 08/11/19 Lap wash out, drain placement, ileostomy creation, 5/19 CT guided drainage of abdominal fluid.  Central access: PICC ordered TPN start date: 08/12/19  Nutritional Goals (per RD recommendation on 5/14): kCal: 2200-2400, Protein: 115-130, Fluid: >2.5 L/day  Goal TPN rate is 100 mL/hr (provides 120 g of protein and 2261 kCal per day)  Current Nutrition:  Full liquid diet with Magic Cup BID -   Plan:  At 1800:  Per MD request will decrease TPN rate to 50 mL/hr, half the target rate  `  Electrolytes in TPN: continue Na 44mEq/L, decrease K+ to 44mEq/L, continue Ca 35mEq/L, continue increased Mag of 40mEq/L, and remove Phos. Cl:Ac ratio 1:1  Add standard MVI and trace elements to TPN  Continue Sensitive SSI & CBG checks q8h and adjust as needed   MIVF per MD (none currently)  Monitor TPN labs on Mon/Thurs.       Royetta Asal, PharmD, BCPS 08/21/2019 10:38 AM

## 2019-08-22 LAB — AEROBIC/ANAEROBIC CULTURE W GRAM STAIN (SURGICAL/DEEP WOUND): Special Requests: NORMAL

## 2019-08-22 LAB — COMPREHENSIVE METABOLIC PANEL
ALT: 20 U/L (ref 0–44)
AST: 28 U/L (ref 15–41)
Albumin: 1.8 g/dL — ABNORMAL LOW (ref 3.5–5.0)
Alkaline Phosphatase: 109 U/L (ref 38–126)
Anion gap: 11 (ref 5–15)
BUN: 17 mg/dL (ref 6–20)
CO2: 25 mmol/L (ref 22–32)
Calcium: 8 mg/dL — ABNORMAL LOW (ref 8.9–10.3)
Chloride: 103 mmol/L (ref 98–111)
Creatinine, Ser: 0.69 mg/dL (ref 0.44–1.00)
GFR calc Af Amer: >60 mL/min (ref >60)
GFR calc non Af Amer: >60 mL/min (ref >60)
Glucose, Bld: 100 mg/dL — ABNORMAL HIGH (ref 70–99)
Potassium: 4 mmol/L (ref 3.5–5.1)
Sodium: 139 mmol/L (ref 135–145)
Total Bilirubin: 0.7 mg/dL (ref 0.3–1.2)
Total Protein: 6 g/dL — ABNORMAL LOW (ref 6.5–8.1)

## 2019-08-22 LAB — DIFFERENTIAL
Abs Immature Granulocytes: 0.4 10*3/uL — ABNORMAL HIGH (ref 0.00–0.07)
Basophils Absolute: 0.2 10*3/uL — ABNORMAL HIGH (ref 0.0–0.1)
Basophils Relative: 1 %
Eosinophils Absolute: 0.3 10*3/uL (ref 0.0–0.5)
Eosinophils Relative: 2 %
Immature Granulocytes: 3 %
Lymphocytes Relative: 15 %
Lymphs Abs: 2.1 10*3/uL (ref 0.7–4.0)
Monocytes Absolute: 1.7 10*3/uL — ABNORMAL HIGH (ref 0.1–1.0)
Monocytes Relative: 12 %
Neutro Abs: 9.2 10*3/uL — ABNORMAL HIGH (ref 1.7–7.7)
Neutrophils Relative %: 67 %

## 2019-08-22 LAB — CBC
HCT: 23.5 % — ABNORMAL LOW (ref 36.0–46.0)
Hemoglobin: 7.2 g/dL — ABNORMAL LOW (ref 12.0–15.0)
MCH: 26.6 pg (ref 26.0–34.0)
MCHC: 30.6 g/dL (ref 30.0–36.0)
MCV: 86.7 fL (ref 80.0–100.0)
Platelets: 837 10*3/uL — ABNORMAL HIGH (ref 150–400)
RBC: 2.71 MIL/uL — ABNORMAL LOW (ref 3.87–5.11)
RDW: 18.1 % — ABNORMAL HIGH (ref 11.5–15.5)
WBC: 13.8 10*3/uL — ABNORMAL HIGH (ref 4.0–10.5)
nRBC: 0 % (ref 0.0–0.2)

## 2019-08-22 LAB — PHOSPHORUS: Phosphorus: 4.6 mg/dL (ref 2.5–4.6)

## 2019-08-22 LAB — GLUCOSE, CAPILLARY
Glucose-Capillary: 113 mg/dL — ABNORMAL HIGH (ref 70–99)
Glucose-Capillary: 111 mg/dL — ABNORMAL HIGH (ref 70–99)
Glucose-Capillary: 100 mg/dL — ABNORMAL HIGH (ref 70–99)

## 2019-08-22 LAB — MAGNESIUM: Magnesium: 2.1 mg/dL (ref 1.7–2.4)

## 2019-08-22 LAB — TRIGLYCERIDES: Triglycerides: 120 mg/dL (ref <150)

## 2019-08-22 LAB — PREALBUMIN: Prealbumin: 7.2 mg/dL — ABNORMAL LOW (ref 18–38)

## 2019-08-22 MED ORDER — LOPERAMIDE HCL 2 MG PO CAPS
2.0000 mg | ORAL_CAPSULE | Freq: Four times a day (QID) | ORAL | Status: DC
  Administered 2019-08-22 (×4): 2 mg via ORAL
  Filled 2019-08-22 (×4): qty 1

## 2019-08-22 MED ORDER — CALCIUM POLYCARBOPHIL 625 MG PO TABS
625.0000 mg | ORAL_TABLET | Freq: Three times a day (TID) | ORAL | Status: DC
  Administered 2019-08-22 – 2019-08-29 (×22): 625 mg via ORAL
  Filled 2019-08-22 (×23): qty 1

## 2019-08-22 MED ORDER — TRAVASOL 10 % IV SOLN
INTRAVENOUS | Status: AC
  Filled 2019-08-22: qty 600

## 2019-08-22 NOTE — Progress Notes (Signed)
PHARMACY - TOTAL PARENTERAL NUTRITION CONSULT NOTE   Indication: Prolonged ileus  Patient Measurements: Height: 5\' 6"  (167.6 cm) Weight: 98 kg (216 lb 0.8 oz) IBW/kg (Calculated) : 59.3 TPN AdjBW (KG): 69.1 Body mass index is 34.87 kg/m. Usual Weight: 102 kg  Assessment:  Patient admitted on 08/04/19 for diverticulitis with microperforation and robotic sigmoidectomy performed.  Anastomotic leak developed and laparoscopy with wash out, drain placement, and ileostomy creation performed on 08/11/19.  TPN consult requested in anticipation of prolonged ileus.  Glucose / Insulin: No hx DM. CBGs at goal < 150; 0 units insulin given in past 24h.  Electrolytes: Phos now WNL, but at upper end of normal range (Phos removed from TPN on 5/19).  All other electrolytes (including Corrected Ca) WNL. Renal: SCr <1 LFTs / TGs: LFTs WNL; TG 128 (5/15), 108 (5/17), 120 (5/24) Prealbumin / albumin: Prealbumin 5.6 (5/15), 6.4 (5/17), 7.2 (5/24); Albumin 1.8 Intake / Output; MIVF: 76ml output from drains, no MIVF GI Imaging: 5/12 CT abd pelvis: suspicion for new leakage and likely developing abscesses; 5/18 CT abd pelvis: Similar size and distribution of multiple abdominal and pelvic gas and fluid collections, most consistent with persistent or recurrent abscesses. Increased fluid and decreased gas within the sigmoid mesocolon, adjacent to a surgical drain. Cannot exclude anastomotic leak in this region.  Surgeries / Procedures: 08/04/19 Robotic Sigmoidectomy, 08/11/19 Lap wash out, drain placement, ileostomy creation, 5/19 CT guided drainage of abdominal fluid.  Central access: PICC ordered TPN start date: 08/12/19  Nutritional Goals (per RD recommendation on 5/20): kCal: 2200-2400, Protein: 115-130, Fluid: >2.5 L/day  Goal TPN rate is 100 mL/hr (provides 120 g of protein and 2261 kCal per day)  Current Nutrition:  TPN at 1/2 rate, regular diet, Magic Cup BID   Plan:  At 1800:  Per MD request, continue TPN  at rate of 50 mL/hr, half goal rate  Electrolytes in TPN: continue Na 51mEq/L, K+ 95mEq/L, Ca 74mEq/L, Mag 79mEq/L, and no Phos. Cl:Ac ratio 1:1  Add standard MVI and trace elements to TPN  Continue Sensitive SSI & CBG checks q8h and adjust as needed   MIVF per MD (none currently)  Monitor TPN labs on Mon/Thurs.     BMET, Mag, Phos in AM.    Lindell Spar, PharmD, BCPS Clinical Pharmacist  08/22/2019 9:24 AM

## 2019-08-22 NOTE — Progress Notes (Signed)
Referring Physician(s): Dr. [name], Surgery  Supervising Physician: Aletta Edouard  Patient Status:  [facility] - In-pt  Chief Complaint:  Abdominal pain/fluid collections  Subjective: Patient in bed, awake and alert, pleasant and states that she is feeling well today. States she has been ambulating and trying to eat more.   Allergies: Sulfamethoxazole-trimethoprim and Sulfa antibiotics  Medications: Prior to Admission medications   Medication Sig Start Date End Date Taking? Authorizing Provider  AUBAGIO 14 MG TABS TAKE 1 TABLET BY MOUTH  DAILY Patient taking differently: Take 14 mg by mouth daily.  02/28/19  Yes Sater, Nanine Means, MD  chlorthalidone (HYGROTON) 25 MG tablet Take 12.5 mg by mouth daily.   Yes [provider]  cholecalciferol (VITAMIN D3) 25 MCG (1000 UT) tablet Take 1,000 Units by mouth daily.   Yes [provider]  ferrous gluconate (FERGON) 324 MG tablet TAKE 1 TABLET(324 MG) BY MOUTH DAILY WITH BREAKFAST Patient taking differently: Take 324 mg by mouth daily with breakfast.  05/18/19  Yes Mansouraty, Telford Nab., MD  folic acid (FOLVITE) 1 MG tablet TAKE 1 TABLET(1 MG) BY MOUTH DAILY Patient taking differently: Take 1 mg by mouth daily.  07/04/19  Yes Mansouraty, Telford Nab., MD  LORazepam (ATIVAN) 1 MG tablet Take 1 mg by mouth 2 (two) times daily as needed for anxiety.  12/15/18  Yes [provider]  omeprazole (PRILOSEC) 40 MG capsule TAKE 1 CAPSULE(40 MG) BY MOUTH DAILY BEFORE BREAKFAST 08/03/19  Yes Mansouraty, Telford Nab., MD  oxybutynin (DITROPAN) 5 MG tablet TAKE 1 TABLET(5 MG) BY MOUTH TWICE DAILY Patient taking differently: Take 5 mg by mouth 2 (two) times daily.  04/13/19  Yes Sater, Nanine Means, MD  potassium chloride (MICRO-K) 10 MEQ CR capsule Take 10 mEq by mouth every morning. 06/29/19  Yes [provider]  Probiotic Product (PROBIOTIC PO) Take 1 capsule by mouth daily.   Yes [provider]  TRELEGY ELLIPTA  100-62.5-25 MCG/INH AEPB Inhale 1 puff into the lungs daily. 02/10/19  Yes [provider]  acetaminophen (TYLENOL) 325 MG tablet Take 2 tablets (650 mg total) by mouth every 6 (six) hours as needed for mild pain (or Fever >/= 101). 11/05/18   Samuella Cota, MD  cyclobenzaprine (FLEXERIL) 5 MG tablet Take 1 tablet (5 mg total) by mouth at bedtime as needed. Patient taking differently: Take 5 mg by mouth at bedtime as needed for muscle spasms.  12/26/16   Sater, Nanine Means, MD  gabapentin (NEURONTIN) 100 MG capsule Take 100 mg by mouth daily as needed (nerve pain).  05/19/18   [provider]  levonorgestrel (MIRENA) 20 MCG/24HR IUD 1 each by Intrauterine route once. 11/14/15   [provider]  meloxicam (MOBIC) 15 MG tablet Take 1 tablet (15 mg total) by mouth daily. Patient taking differently: Take 15 mg by mouth daily as needed for pain.  12/26/16   Sater, Nanine Means, MD  Polyvinyl Alcohol-Povidone (REFRESH OP) Place 2 drops into both eyes 4 (four) times daily as needed (dryness).    [provider]     Vital Signs: BP 129/71 (BP Location: Left Arm)   Pulse (!) 103   Temp 98.5 F (36.9 C) (Oral)   Resp 18   Ht 5\' 6"  (1.676 m)   Wt 216 lb 0.8 oz (98 kg)   LMP 08/08/2019   SpO2 100%   BMI 34.87 kg/m   Physical Exam Pulmonary:     Effort: Pulmonary effort is normal.  Abdominal:     Palpations: Abdomen is soft.     Tenderness: There is abdominal tenderness.     Comments: Total of 4 drains, 3 placed by IR. All with 5-10 cc of fluid in the bulb. Dressings all clean, dry and intact. Patient also has ileostomy.   Skin:    General: Skin is warm and dry.  Neurological:     Mental Status: She is alert and oriented to person, place, and time.  Psychiatric:        Mood and Affect: Mood normal.        Behavior: Behavior normal.        Thought Content: Thought content normal.        Judgment: Judgment normal.     Imaging: No results  found.  Labs:  CBC: Recent Labs    08/16/19 0312 08/18/19 0332 08/20/19 0435 08/22/19 0350  WBC 18.0* 14.2* 15.0* 13.8*  HGB 7.5* 7.4* 7.1* 7.2*  HCT 23.9* 24.2* 23.3* 23.5*  PLT 481* 619* 736* 837*    COAGS: Recent Labs    11/01/18 1531 08/17/19 1040  INR 1.5* 1.1    BMP: Recent Labs    08/18/19 0332 08/19/19 0429 08/21/19 0335 08/22/19 0350  NA 136 137 140 139  K 4.0 4.3 4.1 4.0  CL 103 105 107 103  CO2 26 24 24 25   GLUCOSE 133* 131* 113* 100*  BUN 18 23* 24* 17  CALCIUM 8.3* 8.0* 7.8* 8.0*  CREATININE 0.57 0.65 0.69 0.69  GFRNONAA >60 >60 >60 >60  GFRAA >60 >60 >60 >60    LIVER FUNCTION TESTS: Recent Labs    08/14/19 0346 08/15/19 0301 08/18/19 0332 08/22/19 0350  BILITOT 1.0 1.0 1.0 0.7  AST 20 22 30 28   ALT 11 13 19 20   ALKPHOS 76 81 86 109  PROT 5.1* 5.3* 6.0* 6.0*  ALBUMIN 1.7* 1.7* 1.8* 1.8*    Assessment and Plan: 44 year old female with prior history of diverticular/pelvic abscess 11/02/2018. She is status post robot-assisted sigmoidectomy with lysis of adhesions on 08/04/2019 secondary to complicated diverticulitis. Secondary to anastomotic leak the patient underwent laparoscopy with wash out, drain placement and ileostomy creation on 08/11/2019. She is now status post placement of 3 left abdominal fluid collection drains in IR on 08/17/2019. Output has been minimal the past few days, will order abdominal CT scan for tomorrow.   Continue drain care, will assess results of CT scan for possible removal of drains.    Electronically Signed: Theresa Duty, NP 08/22/2019, 2:38 PM   I spent a total of 15 Minutes at the the patient's bedside AND on the patient's hospital floor or unit, greater than 50% of which was counseling/coordinating care for abdominal abscess drains.

## 2019-08-22 NOTE — Progress Notes (Signed)
Physical Therapy Treatment Patient Details Name: Sandra Bonilla MRN: [id number] DOB: Aug 24, 1975 Today's Date: 08/22/2019    History of Present Illness Pt is 44 yo female with hx of MS.  She presented to hospital with diverticulitis.  Pt s/p cystoscopy with ureteral stent, and XI robot assisted sigmoidectomy and LOA on 5/6.  Pt with complications of anastomotic leak and s/p diagnostic laparoscopy with wash out, drain placement, and ileostomy on 5/13.    PT Comments    Pt progressing toward goals. incr tolerance to activity/incr gait distance. Reviewed ankle pumps, quad/glut sets, and LAQs. Pt is very  Motivated. Continue PT POC  Follow Up Recommendations  Home health PT     Equipment Recommendations  None recommended by PT    Recommendations for Other Services       Precautions / Restrictions Precautions Precautions: Fall Precaution Comments: 4 JP drains; ostomy Restrictions Weight Bearing Restrictions: No    Mobility  Bed Mobility   Bed Mobility: Supine to Sit;Sit to Supine     Supine to sit: Supervision Sit to supine: Supervision   General bed mobility comments: increased time and use of rail. supervision for safety only/drain management  Transfers Overall transfer level: Needs assistance Equipment used: None Transfers: Sit to/from Stand Sit to Stand: Supervision;Modified independent (Device/Increase time)         General transfer comment: no physical assist, good safety awareness and proper hadn placement   Ambulation/Gait Ambulation/Gait assistance: Supervision;Min guard Gait Distance (Feet): 380 Feet Assistive device: None;IV Pole Gait Pattern/deviations: Step-through pattern;Decreased stride length     General Gait Details: amb ~ 7' without use of IV pole, stability  improved with distance. UE support improves abd discomfort. returned to use of IV pole to assist with pain control, pt does have cane at home if needed    Stairs              Wheelchair Mobility    Modified Rankin (Stroke Patients Only)       Balance             Standing balance-Leahy Scale: Fair                              Cognition Arousal/Alertness: Awake/alert Behavior During Therapy: WFL for tasks assessed/performed Overall Cognitive Status: Within Functional Limits for tasks assessed                                 General Comments: very motivated and positive attitude      Exercises      General Comments        Pertinent Vitals/Pain Pain Assessment: 0-10 Pain Score: 4  Pain Location: abdomen with activity Pain Descriptors / Indicators: Discomfort;Sore;Tender Pain Intervention(s): Limited activity within patient's tolerance;Monitored during session;Premedicated before session    Home Living                      Prior Function            PT Goals (current goals can now be found in the care plan section) Acute Rehab PT Goals Patient Stated Goal: return home PT Goal Formulation: With patient Time For Goal Achievement: 08/27/19 Potential to Achieve Goals: Good Progress towards PT goals: Progressing toward goals    Frequency    Min 3X/week      PT Plan Current plan remains appropriate  Co-evaluation              AM-PAC PT "6 Clicks" Mobility   Outcome Measure  Help needed turning from your back to your side while in a flat bed without using bedrails?: None Help needed moving from lying on your back to sitting on the side of a flat bed without using bedrails?: A Little Help needed moving to and from a bed to a chair (including a wheelchair)?: A Little Help needed standing up from a chair using your arms (e.g., wheelchair or bedside chair)?: A Little Help needed to walk in hospital room?: A Little Help needed climbing 3-5 steps with a railing? : A Little 6 Click Score: 19    End of Session   Activity Tolerance: Patient tolerated treatment well Patient left:  with call bell/phone within reach;in bed   PT Visit Diagnosis: Muscle weakness (generalized) (M62.81);Other abnormalities of gait and mobility (R26.89)     Time: [id number] PT Time Calculation (min) (ACUTE ONLY): 21 min  Charges:  $Gait Training: 8-22 mins                     Baxter Flattery, PT   Acute Rehab Dept [facility]): [id number]   08/22/2019    [facility] 08/22/2019, 2:14 PM

## 2019-08-22 NOTE — Progress Notes (Signed)
Nutrition Follow-up  DOCUMENTATION CODES:   Obesity unspecified  INTERVENTION:   -TPN per Pharmacy -Magic cup BID with meals, each supplement provides 290 kcal and 9 grams of protein  -"Ileostomy Nutrition Therapy" handout from [employer]  NUTRITION DIAGNOSIS:   Increased nutrient needs related to acute illness, post-op healing as evidenced by estimated needs.  Ongoing.  GOAL:   Patient will meet greater than or equal to 90% of their needs  Progressing.  MONITOR:   PO intake, Supplement acceptance, Diet advancement, Labs, Weight trends, Other (Comment)(TPN)  REASON FOR ASSESSMENT:   Consult Diet education  ASSESSMENT:   44 year old female with a history of MS, diverticulosis, diverticulitis, HTN, fibroadenoma of the R breast, anxiety, depression, asthma, and anemia. Patient presented to the hospital due to irregular BMs and worsening abdominal pain.  Significant Events: 5/6- admission 5/13- 61F NGT placement in L nare; diagnostic lap with washout, drain placement, and ileostomy creation  5/14- initial RD assessment; double lumen PICC placed in R basilic; TPN initiation [id number]- NGT removed 5/16- diet advanced from NPO to CLD 5/17- diet advanced to FLD 5/19- intra-abdominal drain placement x3 d/t intra-abdominal fluid collections. 5/21 - diet advanced to soft  **RD working remotely**  Patient is on a regular diet as of today. Per PO documentation, pt had a few fries and a cookie last night at dinnertime. Pt with poor appetite currently.  MD consulted RD to provide diet education regarding new ileostomy. Will attempt education 5/25 in person. Will go ahead and add handout to discharge instructions.   TPN to continue at half rate, 50 ml/hr providing 1130 kcals and 60 g protein.  Admission weight: 202 lbs. Current weight: 216 lbs.  Medications: Ferrous sulfate, Fibercon Labs reviewed: CBGs: 111-113  Diet Order:   Diet Order             Diet regular Room service appropriate? Yes; Fluid consistency: Thin  Diet effective now              EDUCATION NEEDS:   Not appropriate for education at this time  Skin:  Skin Assessment: Skin Integrity Issues: Skin Integrity Issues:: Incisions Incisions: abdomen (5/13)  Last BM:  5/23 -type 7  Height:   Ht Readings from Last 1 Encounters:  08/17/19 5\' 6"  (1.676 m)    Weight:   Wt Readings from Last 1 Encounters:  08/22/19 98 kg    Ideal Body Weight:     BMI:  Body mass index is 34.87 kg/m.  Estimated Nutritional Needs:   Kcal:  2200-2400 kcal  Protein:  115-130 grams  Fluid:  >/= 2.5 L/day  Clayton Bibles, MS, RD, LDN Inpatient Clinical Dietitian Contact information available via Amion

## 2019-08-22 NOTE — Consult Note (Signed)
[place] Nurse ostomy follow up Stoma type/location: RMQ ileostomy Stomal assessment/size: 1 1/2" pink and moist  Producing liquid green stool Peristomal assessment: intact Treatment options for stomal/peristomal skin: 2 barrier rings Output see above Ostomy pouching: 2pc. 2 3/4" pouch with 2 barrier rings to build convexity and promote seal.  Was not wearingbelt today.   Education provided: patient independent in emptying.  Understands to empty when 1/3 full.  Does not want to try belt due to bilateral flank drains.  Understands she needs to practice cutting barrier and application of barrier rings and pouching system.  Enrolled patient in [place] Start Discharge program: Yes Will not follow at this time.  Please re-consult if needed.  Domenic Moras MSN, RN, FNP-BC CWON Wound, Ostomy, Continence Nurse Pager [phone]

## 2019-08-22 NOTE — Progress Notes (Signed)
11 Days Post-Op lap washout and ileostomy Subjective: Not much appettite.  Having ostomy function.  Still has lower abd pain Objective: Vital signs in last 24 hours: Temp:  [97.5 F (36.4 C)-98.5 F (36.9 C)] 97.9 F (36.6 C) (05/24 0544) Pulse Rate:  [92-126] 92 (05/24 0544) Resp:  [16-18] 18 (05/24 0544) BP: (127-152)/(75-84) 129/75 (05/24 0544) SpO2:  [95 %-99 %] 97 % (05/24 0544) Weight:  [98 kg] 98 kg (05/24 0500)   Intake/Output from previous day: 05/23 0701 - 05/24 0700 In: 1967.3 [P.O.:530; I.V.:1148.1; IV Piggyback:274.2] Out: [id number] [Urine:1650; Drains:70; [id number] Intake/Output this shift: No intake/output data recorded. General appearance: alert and cooperative GI: normal findings: soft, non-distended JP: brownish fluid Ostomy: beefy red, with good output Incision: no significant drainage  Lab Results:  Recent Labs    08/20/19 0435 08/22/19 0350  WBC 15.0* 13.8*  HGB 7.1* 7.2*  HCT 23.3* 23.5*  PLT 736* 837*   BMET Recent Labs    08/21/19 0335 08/22/19 0350  NA 140 139  K 4.1 4.0  CL 107 103  CO2 24 25  GLUCOSE 113* 100*  BUN 24* 17  CREATININE 0.69 0.69  CALCIUM 7.8* 8.0*   PT/INR No results for input(s): LABPROT, INR in the last 72 hours. ABG No results for input(s): PHART, HCO3 in the last 72 hours.  Invalid input(s): PCO2, PO2  MEDS, Scheduled . Chlorhexidine Gluconate Cloth  6 each Topical Daily  . enoxaparin (LOVENOX) injection  40 mg Subcutaneous Q24H  . ferrous sulfate  325 mg Oral BID WC  . fluticasone furoate-vilanterol  1 puff Inhalation Daily   And  . umeclidinium bromide  1 puff Inhalation Daily  . gabapentin  400 mg Oral TID  . hydrocortisone cream   Topical BID  . insulin aspart  0-9 Units Subcutaneous Q8H  . loperamide  2 mg Oral QID  . pantoprazole  40 mg Oral QHS  . polycarbophil  625 mg Oral TID  . sodium chloride flush  10-40 mL Intracatheter Q12H  . sodium chloride flush  5 mL Intracatheter Q8H     Studies/Results: No results found.  Assessment: s/p Procedure(s): XI ROBOT ASSISTED SIGMOIDECTOMY FIREFLY INJECTION Patient Active Problem List   Diagnosis Date Noted  . Ileostomy in place [facility]) 08/20/2019  . Postoperative anemia due to acute blood loss 08/20/2019  . 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 Anemia of chronic disease 06/25/2019  . Change in bowel habits 06/25/2019  . IUD threads lost 01/26/2019  . History of abnormal cervical Pap smear 01/25/2019  . Diverticulitis of large intestine with perforation and abscess 12/30/2018  . Hx of diverticulitis of colon 12/24/2018  . Dyspepsia 12/24/2018  . Non-intractable vomiting 12/24/2018  . Abnormal CT scan, pelvis 12/24/2018  . Urinary dysfunction 12/30/2017  . Varicose veins of bilateral lower extremities with pain 10/28/2017  . Low grade squamous intraepithelial lesion (LGSIL) on cervical Pap smear 10/29/2016  . 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 Essential hypertension 04/22/2016  . Other headache syndrome 02/05/2016  . Vitamin D deficiency 10/09/2015  . Other fatigue 10/09/2015  . Dysesthesia 04/11/2015  . Depression with anxiety 04/11/2015  . Gait disturbance 12/13/2014  . Insomnia 12/13/2014  . Multiple sclerosis ([place]) 08/30/2012    Anastomotic leak, s/p washout, drainage and diversion  Plan: Stable Cont TPN at half rate Minimize IVF's Cont IV abx, Eraxis Incentive spirometry Ambulate TID    LOS: 18 days     .Rosario Adie,  MD [facility] Surgery, [place]    08/22/2019 8:58 AM

## 2019-08-23 ENCOUNTER — Encounter (HOSPITAL_COMMUNITY): Payer: Self-pay | Admitting: General Surgery

## 2019-08-23 ENCOUNTER — Inpatient Hospital Stay (HOSPITAL_COMMUNITY): Payer: BC Managed Care – PPO

## 2019-08-23 LAB — BASIC METABOLIC PANEL
Anion gap: 10 (ref 5–15)
BUN: 14 mg/dL (ref 6–20)
CO2: 27 mmol/L (ref 22–32)
Calcium: 8 mg/dL — ABNORMAL LOW (ref 8.9–10.3)
Chloride: 102 mmol/L (ref 98–111)
Creatinine, Ser: 0.77 mg/dL (ref 0.44–1.00)
GFR calc Af Amer: >60 mL/min (ref >60)
GFR calc non Af Amer: >60 mL/min (ref >60)
Glucose, Bld: 102 mg/dL — ABNORMAL HIGH (ref 70–99)
Potassium: 3.9 mmol/L (ref 3.5–5.1)
Sodium: 139 mmol/L (ref 135–145)

## 2019-08-23 LAB — PHOSPHORUS: Phosphorus: 4.6 mg/dL (ref 2.5–4.6)

## 2019-08-23 LAB — MAGNESIUM: Magnesium: 2.1 mg/dL (ref 1.7–2.4)

## 2019-08-23 LAB — GLUCOSE, CAPILLARY
Glucose-Capillary: 113 mg/dL — ABNORMAL HIGH (ref 70–99)
Glucose-Capillary: 122 mg/dL — ABNORMAL HIGH (ref 70–99)

## 2019-08-23 MED ORDER — IOHEXOL 300 MG/ML  SOLN
100.0000 mL | Freq: Once | INTRAMUSCULAR | Status: AC | PRN
  Administered 2019-08-23: 100 mL via INTRAVENOUS

## 2019-08-23 MED ORDER — FLUTICASONE-UMECLIDIN-VILANT 100-62.5-25 MCG/INH IN AEPB: 1.0000 | INHALATION_SPRAY | Freq: Every day | RESPIRATORY_TRACT | Status: DC

## 2019-08-23 MED ORDER — LOPERAMIDE HCL 2 MG PO CAPS
2.0000 mg | ORAL_CAPSULE | Freq: Three times a day (TID) | ORAL | Status: DC
  Administered 2019-08-23 – 2019-08-29 (×18): 2 mg via ORAL
  Filled 2019-08-23 (×18): qty 1

## 2019-08-23 MED ORDER — SODIUM CHLORIDE (PF) 0.9 % IJ SOLN
INTRAMUSCULAR | Status: AC
  Filled 2019-08-23: qty 50

## 2019-08-23 NOTE — Progress Notes (Signed)
Physical Therapy Treatment Patient Details Name: Sandra Bonilla MRN: [id number] DOB: 11-02-1975 Today's Date: 08/23/2019    History of Present Illness Pt is 44 yo female with hx of MS.  She presented to hospital with diverticulitis.  Pt s/p cystoscopy with ureteral stent, and XI robot assisted sigmoidectomy and LOA on 5/6.  Pt with complications of anastomotic leak and s/p diagnostic laparoscopy with wash out, drain placement, and ileostomy on 5/13.    PT Comments    Pt reports some fatigue/had discomfort during CT scan earlier today; pain is better and she is agreeable to exercises in bed. Pt is very motivated to work with PT.  Jaylani is hopeful to get rid of at least one drain soon, (pending CT results)!  Follow Up Recommendations  Home health PT     Equipment Recommendations  None recommended by PT    Recommendations for Other Services       Precautions / Restrictions Precautions Precautions: Fall Precaution Comments: 4 JP drains; ostomy Restrictions Weight Bearing Restrictions: No    Mobility  Bed Mobility                  Transfers                    Ambulation/Gait                 Stairs             Wheelchair Mobility    Modified Rankin (Stroke Patients Only)       Balance                                            Cognition Arousal/Alertness: Awake/alert Behavior During Therapy: WFL for tasks assessed/performed Overall Cognitive Status: Within Functional Limits for tasks assessed                                 General Comments: very motivated and positive attitude      Exercises General Exercises - Lower Extremity Ankle Circles/Pumps: AROM;Both;15 reps Quad Sets: AROM;Both;10 reps Gluteal Sets: AROM;Both;10 reps Heel Slides: AROM;Both;10 reps Hip ABduction/ADduction: AROM;Both;10 reps    General Comments        Pertinent Vitals/Pain Pain Assessment: 0-10 Pain Score: 1   Pain Location: abdomen with activity Pain Descriptors / Indicators: Discomfort;Sore Pain Intervention(s): Limited activity within patient's tolerance;Monitored during session    Home Living                      Prior Function            PT Goals (current goals can now be found in the care plan section) Acute Rehab PT Goals Patient Stated Goal: return home PT Goal Formulation: With patient Time For Goal Achievement: 08/27/19 Potential to Achieve Goals: Good Progress towards PT goals: Progressing toward goals    Frequency    Min 3X/week      PT Plan Current plan remains appropriate    Co-evaluation              AM-PAC PT "6 Clicks" Mobility   Outcome Measure  Help needed turning from your back to your side while in a flat bed without using bedrails?: None Help needed moving from lying on your back to sitting on  the side of a flat bed without using bedrails?: A Little Help needed moving to and from a bed to a chair (including a wheelchair)?: A Little Help needed standing up from a chair using your arms (e.g., wheelchair or bedside chair)?: A Little Help needed to walk in hospital room?: A Little Help needed climbing 3-5 steps with a railing? : A Little 6 Click Score: 19    End of Session   Activity Tolerance: Patient tolerated treatment well Patient left: with call bell/phone within reach;in bed   PT Visit Diagnosis: Muscle weakness (generalized) (M62.81);Other abnormalities of gait and mobility (R26.89)     Time: [id number] PT Time Calculation (min) (ACUTE ONLY): 19 min  Charges:  $Therapeutic Exercise: 8-22 mins                     Baxter Flattery, PT   Acute Rehab Dept [facility]): [id number]   08/23/2019    [facility] 08/23/2019, 2:54 PM

## 2019-08-23 NOTE — Progress Notes (Signed)
Nutrition Education Note  RD consulted for nutrition education regarding a diet status post ileostomy.  RD provided "Ileostomy Nutrition Therapy" handout from the Academy of Nutrition and Dietetics. Encouraged patient to keep fiber intake below 8 grams during transition from liquids to solids, and below 13 grams per day as symptoms subside. Encouraged patient to consume refined and white grain foods with less than 2g of fiber per serving as well as soft, well-cooked proteins, and well cooked vegetables without seeds or skin.  RD discussed why it is important to adhere to list of recommended foods, and foods to avoid, to maintain ostomy integrity and decrease risk of gas, diarrhea, and blockage related complications. Encouraged patient to consume 8 to 10 cups of liquid per day and to drink liquids 30 minutes after meals or snacks to prevent flushing.   Provided tips to ensure adequate absorption of medications, vitamins, and minerals. After 6 weeks, as patient's diet returns to normal, encouraged adding 1 new food in per day, for a few days to monitor tolerance.  Expect good compliance. RD answered all of pt's questions. Pt appreciated handout.  Body mass index is 34.87 kg/m. Pt meets criteria for obesity based on current BMI.   Current diet order is regular. Patient reports not eating that much, with early satiety. Labs and medications reviewed. No further nutrition interventions warranted at this time. If additional nutrition issues arise, please re-consult RD   Clayton Bibles, MS, RD, LDN Inpatient Clinical Dietitian Contact information available via Amion

## 2019-08-23 NOTE — Progress Notes (Signed)
12 Days Post-Op lap washout and ileostomy Subjective: Not much appettite but tolerating PO.  Having less ostomy function with imodium.  Has some lower abd burning pain Objective: Vital signs in last 24 hours: Temp:  [98.2 F (36.8 C)-98.7 F (37.1 C)] 98.6 F (37 C) (05/25 0657) Pulse Rate:  [99-109] 99 (05/25 0657) Resp:  [18-20] 18 (05/25 0657) BP: (128-135)/(71-79) 130/74 (05/25 0657) SpO2:  [94 %-100 %] 94 % (05/25 0657)   Intake/Output from previous day: 05/24 0701 - 05/25 0700 In: 2033.7 [P.O.:960; I.V.:888.7; IV Piggyback:150] Out: 2445 [Urine:1900; Drains:95; Stool:450] Intake/Output this shift: No intake/output data recorded. General appearance: alert and cooperative GI: normal findings: soft, non-distended Surgical JP: brownish fluid Ostomy: beefy red, with good output Incision: no significant drainage  Lab Results:  Recent Labs    08/22/19 0350  WBC 13.8*  HGB 7.2*  HCT 23.5*  PLT 837*   BMET Recent Labs    08/22/19 0350 08/23/19 0350  NA 139 139  K 4.0 3.9  CL 103 102  CO2 25 27  GLUCOSE 100* 102*  BUN 17 14  CREATININE 0.69 0.77  CALCIUM 8.0* 8.0*   PT/INR No results for input(s): LABPROT, INR in the last 72 hours. ABG No results for input(s): PHART, HCO3 in the last 72 hours.  Invalid input(s): PCO2, PO2  MEDS, Scheduled . Chlorhexidine Gluconate Cloth  6 each Topical Daily  . enoxaparin (LOVENOX) injection  40 mg Subcutaneous Q24H  . ferrous sulfate  325 mg Oral BID WC  . fluticasone furoate-vilanterol  1 puff Inhalation Daily   And  . umeclidinium bromide  1 puff Inhalation Daily  . gabapentin  400 mg Oral TID  . hydrocortisone cream   Topical BID  . insulin aspart  0-9 Units Subcutaneous Q8H  . loperamide  2 mg Oral TID AC  . pantoprazole  40 mg Oral QHS  . polycarbophil  625 mg Oral TID  . sodium chloride flush  10-40 mL Intracatheter Q12H  . sodium chloride flush  5 mL Intracatheter Q8H    Studies/Results: No results  found.  Assessment: s/p Procedure(s): XI ROBOT ASSISTED SIGMOIDECTOMY FIREFLY INJECTION Patient Active Problem List   Diagnosis Date Noted  . Ileostomy in place [facility]) 08/20/2019  . Postoperative anemia due to acute blood loss 08/20/2019  . 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 Anemia of chronic disease 06/25/2019  . Change in bowel habits 06/25/2019  . IUD threads lost 01/26/2019  . History of abnormal cervical Pap smear 01/25/2019  . Diverticulitis of large intestine with perforation and abscess 12/30/2018  . Hx of diverticulitis of colon 12/24/2018  . Dyspepsia 12/24/2018  . Non-intractable vomiting 12/24/2018  . Abnormal CT scan, pelvis 12/24/2018  . Urinary dysfunction 12/30/2017  . Varicose veins of bilateral lower extremities with pain 10/28/2017  . Low grade squamous intraepithelial lesion (LGSIL) on cervical Pap smear 10/29/2016  . 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 Essential hypertension 04/22/2016  . Other headache syndrome 02/05/2016  . Vitamin D deficiency 10/09/2015  . Other fatigue 10/09/2015  . Dysesthesia 04/11/2015  . Depression with anxiety 04/11/2015  . Gait disturbance 12/13/2014  . Insomnia 12/13/2014  . Multiple sclerosis ([place]) 08/30/2012    Anastomotic leak, s/p washout, drainage and diversion  Plan: Stable, progressing appropriately Will stop TPN and see how she eats without it Minimize IVF's, SL PICC Will eval CT today, if looks better, plan on stopping Abx Incentive spirometry Ambulate TID  LOS: 19 days     .Rosario Adie, MD [facility], [place]    08/23/2019 8:33 AM

## 2019-08-23 NOTE — Progress Notes (Signed)
PHARMACY - TOTAL PARENTERAL NUTRITION CONSULT NOTE   Indication: Prolonged ileus  Patient Measurements: Height: 5\' 6"  (167.6 cm) Weight: 98 kg (216 lb 0.8 oz) IBW/kg (Calculated) : 59.3 TPN AdjBW (KG): 69.1 Body mass index is 34.87 kg/m. Usual Weight: 102 kg  Assessment:  Patient admitted on 08/04/19 for diverticulitis with microperforation and robotic sigmoidectomy performed.  Anastomotic leak developed and laparoscopy with wash out, drain placement, and ileostomy creation performed on 08/11/19.  TPN consult requested in anticipation of prolonged ileus.  Glucose / Insulin: No hx DM. CBGs at goal < 150; 1 unit SSI given in past 24h.  Electrolytes: Phos now WNL, but at upper end of normal range (Phos removed from TPN on 5/19).  All other electrolytes (including Corrected Ca) WNL. Renal: SCr <1 LFTs / TGs: LFTs WNL; TG 128 (5/15), 108 (5/17), 120 (5/24) Prealbumin / albumin: Prealbumin 5.6 (5/15), 6.4 (5/17), 7.2 (5/24); Albumin 1.8 Intake / Output; MIVF: 21ml output from drains, no MIVF GI Imaging: 5/12 CT abd pelvis: suspicion for new leakage and likely developing abscesses; 5/18 CT abd pelvis: Similar size and distribution of multiple abdominal and pelvic gas and fluid collections, most consistent with persistent or recurrent abscesses. Increased fluid and decreased gas within the sigmoid mesocolon, adjacent to a surgical drain. Cannot exclude anastomotic leak in this region.  Surgeries / Procedures: 08/04/19 Robotic Sigmoidectomy, 08/11/19 Lap wash out, drain placement, ileostomy creation, 5/19 CT guided drainage of abdominal fluid.  Central access: PICC ordered TPN start date: 08/12/19  Nutritional Goals (per RD recommendation on 5/20): kCal: 2200-2400, Protein: 115-130, Fluid: >2.5 L/day  Goal TPN rate is 100 mL/hr (provides 120 g of protein and 2261 kCal per day)  Current Nutrition:  TPN at 1/2 rate, regular diet, Magic Cup BID   Plan:  At 1800:  Stop TPN; no need to wean since  already at [place], PharmD, [place] 08/23/2019, 8:41 AM

## 2019-08-23 NOTE — Progress Notes (Signed)
Patient had CT scan today to evaluate drains. Per Dr. Laurence Ferrari, LLQ drain can be removed as long as output remains minimal and serous. Message sent to Dr. [name] with surgery to confirm this plan. Will await her reply.  Soyla Dryer, NP Interventional Radiology

## 2019-08-23 NOTE — Progress Notes (Signed)
Late entry for 08/22/19 1830: pt's ostomy was leaking so a bag change was done.  Pt's skin was reddened and a small amount of skin came off with the removal of the wafer. Skin prep was applied before the new wafer was placed.

## 2019-08-24 MED ORDER — FLUTICASONE FUROATE-VILANTEROL 100-25 MCG/INH IN AEPB
1.0000 | INHALATION_SPRAY | Freq: Every day | RESPIRATORY_TRACT | Status: DC
  Filled 2019-08-24: qty 28

## 2019-08-24 MED ORDER — HYDROMORPHONE HCL 1 MG/ML IJ SOLN
0.5000 mg | INTRAMUSCULAR | Status: DC | PRN
  Administered 2019-08-24 (×2): 0.5 mg via INTRAVENOUS
  Administered 2019-08-25 – 2019-08-26 (×4): 1 mg via INTRAVENOUS
  Filled 2019-08-24: qty 0.5
  Filled 2019-08-24 (×3): qty 1
  Filled 2019-08-24: qty 0.5
  Filled 2019-08-24: qty 1

## 2019-08-24 MED ORDER — OXYBUTYNIN CHLORIDE 5 MG PO TABS
5.0000 mg | ORAL_TABLET | Freq: Two times a day (BID) | ORAL | Status: DC
  Administered 2019-08-24 – 2019-08-29 (×11): 5 mg via ORAL
  Filled 2019-08-24 (×11): qty 1

## 2019-08-24 MED ORDER — CHLORTHALIDONE 25 MG PO TABS
12.5000 mg | ORAL_TABLET | Freq: Every day | ORAL | Status: DC
  Administered 2019-08-24 – 2019-08-29 (×6): 12.5 mg via ORAL
  Filled 2019-08-24 (×6): qty 1

## 2019-08-24 MED ORDER — TERIFLUNOMIDE 14 MG PO TABS
1.0000 | ORAL_TABLET | Freq: Every day | ORAL | Status: DC
  Administered 2019-08-25 – 2019-08-29 (×5): 1 via ORAL

## 2019-08-24 MED ORDER — UMECLIDINIUM BROMIDE 62.5 MCG/INH IN AEPB
1.0000 | INHALATION_SPRAY | Freq: Every day | RESPIRATORY_TRACT | Status: DC
  Filled 2019-08-24: qty 7

## 2019-08-24 NOTE — Progress Notes (Signed)
Referring Physician(s): Che[place]   Supervising Physician: Sandi Mariscal  Patient Status:  [facility] - In-pt  Chief Complaint:  Abdominal pain/fluid collections  Subjective: Pt doing about the same today; still has some lower abd discomfort, nausea earlier today but now resolved   Allergies: Sulfamethoxazole-trimethoprim and Sulfa antibiotics  Medications: Prior to Admission medications   Medication Sig Start Date End Date Taking? Authorizing Provider  AUBAGIO 14 MG TABS TAKE 1 TABLET BY MOUTH  DAILY Patient taking differently: Take 14 mg by mouth daily.  02/28/19  Yes Sater, Nanine Means, MD  chlorthalidone (HYGROTON) 25 MG tablet Take 12.5 mg by mouth daily.   Yes [provider]  cholecalciferol (VITAMIN D3) 25 MCG (1000 UT) tablet Take 1,000 Units by mouth daily.   Yes [provider]  ferrous gluconate (FERGON) 324 MG tablet TAKE 1 TABLET(324 MG) BY MOUTH DAILY WITH BREAKFAST Patient taking differently: Take 324 mg by mouth daily with breakfast.  05/18/19  Yes Mansouraty, Telford Nab., MD  folic acid (FOLVITE) 1 MG tablet TAKE 1 TABLET(1 MG) BY MOUTH DAILY Patient taking differently: Take 1 mg by mouth daily.  07/04/19  Yes Mansouraty, Telford Nab., MD  LORazepam (ATIVAN) 1 MG tablet Take 1 mg by mouth 2 (two) times daily as needed for anxiety.  12/15/18  Yes [provider]  omeprazole (PRILOSEC) 40 MG capsule TAKE 1 CAPSULE(40 MG) BY MOUTH DAILY BEFORE BREAKFAST 08/03/19  Yes Mansouraty, Telford Nab., MD  oxybutynin (DITROPAN) 5 MG tablet TAKE 1 TABLET(5 MG) BY MOUTH TWICE DAILY Patient taking differently: Take 5 mg by mouth 2 (two) times daily.  04/13/19  Yes Sater, Nanine Means, MD  potassium chloride (MICRO-K) 10 MEQ CR capsule Take 10 mEq by mouth every morning. 06/29/19  Yes [provider]  Probiotic Product (PROBIOTIC PO) Take 1 capsule by mouth daily.   Yes [provider]  TRELEGY ELLIPTA 100-62.5-25 MCG/INH AEPB Inhale 1 puff into the  lungs daily. 02/10/19  Yes [provider]  acetaminophen (TYLENOL) 325 MG tablet Take 2 tablets (650 mg total) by mouth every 6 (six) hours as needed for mild pain (or Fever >/= 101). 11/05/18   Samuella Cota, MD  cyclobenzaprine (FLEXERIL) 5 MG tablet Take 1 tablet (5 mg total) by mouth at bedtime as needed. Patient taking differently: Take 5 mg by mouth at bedtime as needed for muscle spasms.  12/26/16   Sater, Nanine Means, MD  gabapentin (NEURONTIN) 100 MG capsule Take 100 mg by mouth daily as needed (nerve pain).  05/19/18   [provider]  levonorgestrel (MIRENA) 20 MCG/24HR IUD 1 each by Intrauterine route once. 11/14/15   [provider]  meloxicam (MOBIC) 15 MG tablet Take 1 tablet (15 mg total) by mouth daily. Patient taking differently: Take 15 mg by mouth daily as needed for pain.  12/26/16   Sater, Nanine Means, MD  Polyvinyl Alcohol-Povidone (REFRESH OP) Place 2 drops into both eyes 4 (four) times daily as needed (dryness).    [provider]     Vital Signs: BP 129/76 (BP Location: Left Arm)   Pulse 96   Temp 98.1 F (36.7 C) (Oral)   Resp 18   Ht 5\' 6"  (1.676 m)   Wt 207 lb 10.8 oz (94.2 kg)   LMP 08/08/2019   SpO2 96%   BMI 33.52 kg/m   Physical Exam : awake/alert; abd drains intact, dressings dry, OP range form 25-45 cc yesterday; no sig output today   Imaging:  CT ABDOMEN PELVIS W CONTRAST  Result Date: 08/23/2019 CLINICAL DATA:  44 year old with the complex surgical history including robotic assisted sigmoidectomy with lysis of adhesions and follow-up washout, drain placement and ileostomy creation. Total of 3 CT-guided drains were placed on 08/17/2019 for postoperative fluid collections. EXAM: CT ABDOMEN AND PELVIS WITH CONTRAST TECHNIQUE: Multidetector CT imaging of the abdomen and pelvis was performed using the standard protocol following bolus administration of intravenous contrast. CONTRAST:  142mL OMNIPAQUE IOHEXOL 300 MG/ML  SOLN  COMPARISON:  CT 08/16/2019 FINDINGS: Lower chest: Small amount of right pleural fluid. Air bronchograms and consolidation in the right lower lobe. Small amount of consolidation in the anterior left lower lobe is similar to the comparison CT. Patchy atelectasis at the left lung base. Hepatobiliary: Cholecystectomy.  Normal appearance of the liver. Pancreas: No evidence for pancreatic inflammation. There is a 4 mm hypodensity along the anterior aspect of the pancreatic body on sequence 2 image 37. Similar finding was present on 12/13/2013 and likely represents a benign etiology. Spleen: Normal in size without focal abnormality. Adrenals/Urinary Tract: 2.0 cm nodule in the left adrenal gland has minimally changed since 2015 and likely represents a benign adenoma. Normal appearance of the right adrenal gland. Contrast in the renal collecting systems bilaterally. No hydronephrosis. Small amount of fluid in a decompressed urinary bladder. Cannot exclude wall thickening along the dome of the bladder adjacent to pelvic fluid collection and surgical drain. Findings along the bladder dome are similar to the prior examination. Stomach/Bowel: Anastomotic clips in the sigmoid colon. Rectum is mildly distended with high-density material. Sigmoid colon and left colon are decompressed. Oral contrast in the proximal descending colon, transverse colon and right colon. Again noted is a right abdominal ileostomy. No significant small bowel dilatation. Normal appearance of the stomach. Vascular/Lymphatic: Evidence for a central line terminating near the SVC and right atrium junction. No significant atherosclerotic calcifications in the aorta or iliac arteries. Negative for an abdominal aortic aneurysm. Venous structures are unremarkable. No significant abdominopelvic lymphadenopathy. Reproductive: Stable appearance of the uterus. Collection in the region of the right adnexa contains gas and similar to the previous examination. Other:  Surgical change in the right lower abdomen is still present. This drain extends posteriorly and the tip is at the level of the sacrum. Again noted is a small amount of air and gas in the right upper pelvis adjacent to the drain on sequence 2, image 80. This collection is poorly defined, roughly measures 6.3 x 2.7 cm and previously measured 6.0 x 3.4 cm. Air-fluid collection near the tip of the surgical drain in the posterior pelvis that measures 4.0 cm in the AP dimension and this is similar to the previous examination. Previously, there were 3 distinct air-fluid collections in the anterior abdomen which have been decompressed with percutaneous drains. The collection in the upper mid abdomen with a drain measures 3.3 x 2.2 cm and the abscess previously measured 4.6 x 4.5 cm. Small amount of residual gas and fluid associated with this collection and drain. Second drain in the left upper abdomen and that fluid collection has nearly resolved. Previously that abscess collection measured 5.8 x 4.3 cm. Third CT drain is located in the lower abdomen just left of midline. The abscess in this area has markedly decreased in size but there is a small residual collection just cephalad to the drain that measures roughly 2.2 x 1.5 cm. Small residual interloop abscess on sequence 2 image 45 measures 3.7 x 2.2 cm and previously  measured 3.8 x 2.0 cm. Small fluid collections near the pancreatic tail have decreased in size in the left upper abdomen. Small residual fluid collections in the right abdomen on sequence 2 image 43. Index fluid collection in the right abdomen measures 3.4 x 2.0 cm and previously measured 3.8 x 2.0 cm. Persistent stranding and edema in the abdominal mesentery. Additional small anterior abdominal abscess on sequence 2, image 60 at the midline measures up to 1.8 cm and previously measured 2.2 cm. Small fluid collection associated with the surgical drain along the bladder dome measures roughly 2.9 cm and  previously measured 3.4 cm. Evidence for additional interloop abscesses or fluid collections in the lower abdomen on sequence 2 image 71. Musculoskeletal: Severe disc space narrowing with endplate changes at X33443. No acute bone abnormality. IMPRESSION: 1. CT-guided drains are well positioned. The abscess collections associated with the drains have markedly decreased in size. 2. Multiple small abscess collections throughout the abdomen and pelvis. Most of these small collections have decreased in size from the exam on 08/16/1999. No new abscess collections. 3. Persistent air-fluid collections associated with the surgical drain in the pelvis. Fluid associated the surgical drain has slightly decreased in size. 4. Small right pleural effusion. Persistent patchy consolidation in the lower lungs, right side greater than left. Findings could represent atelectasis but cannot exclude infectious etiology. 5. Stable incidental findings involving the pancreas and left adrenal gland as described. Electronically Signed   By: Markus Daft M.D.   On: 08/23/2019 14:43    Labs:  CBC: Recent Labs    08/16/19 0312 08/18/19 0332 08/20/19 0435 08/22/19 0350  WBC 18.0* 14.2* 15.0* 13.8*  HGB 7.5* 7.4* 7.1* 7.2*  HCT 23.9* 24.2* 23.3* 23.5*  PLT 481* 619* 736* 837*    COAGS: Recent Labs    11/01/18 1531 08/17/19 1040  INR 1.5* 1.1    BMP: Recent Labs    08/19/19 0429 08/21/19 0335 08/22/19 0350 08/23/19 0350  NA 137 140 139 139  K 4.3 4.1 4.0 3.9  CL 105 107 103 102  CO2 24 24 25 27   GLUCOSE 131* 113* 100* 102*  BUN 23* 24* 17 14  CALCIUM 8.0* 7.8* 8.0* 8.0*  CREATININE 0.65 0.69 0.69 0.77  GFRNONAA >60 >60 >60 >60  GFRAA >60 >60 >60 >60    LIVER FUNCTION TESTS: Recent Labs    08/14/19 0346 08/15/19 0301 08/18/19 0332 08/22/19 0350  BILITOT 1.0 1.0 1.0 0.7  AST 20 22 30 28   ALT 11 13 19 20   ALKPHOS 76 81 86 109  PROT 5.1* 5.3* 6.0* 6.0*  ALBUMIN 1.7* 1.7* 1.8* 1.8*    Assessment  and Plan: 44 yo female with prior hx ofdiverticular/pelvic abscess drainage on 11/02/2018 with subsequent drain removal following negative fistulogram on 11/16/2018. She is status post robot-assisted sigmoidectomy with lysis of adhesions on 08/04/2019 secondary to complicated diverticulitis.Secondary to anastomotic leak patient underwent laparoscopy with washout, drain placement and ileostomy creation on 08/11/2019; now s/p placement of 3 left abdominal fluid collection drains 5/19; afebrile; creat nl, last WBC 13.8 , cx- few candida; CT A/P yesterday revealed:  1. CT-guided drains are well positioned. The abscess collections associated with the drains have markedly decreased in size. 2. Multiple small abscess collections throughout the abdomen and pelvis. Most of these small collections have decreased in size from the exam on 08/16/1999. No new abscess collections. 3. Persistent air-fluid collections associated with the surgical drain in the pelvis. Fluid associated the surgical drain has slightly  decreased in size. 4. Small right pleural effusion. Persistent patchy consolidation in the lower lungs, right side greater than left. Findings could represent atelectasis but cannot exclude infectious etiology. 5. Stable incidental findings involving the pancreas and left adrenal gland as described.  Images were reviewed by Dr. Laurence Ferrari;  left lat abd drain removed today in its entirety; additional drains to remain in place for now  Electronically Signed: D. Rowe Robert, PA-C 08/24/2019, 3:40 PM   I spent a total of  15 minutes at the the patient's bedside AND on the patient's hospital floor or unit, greater than 50% of which was counseling/coordinating care for abdominal fluid collection drains    Patient ID: Sandra Bonilla, female   DOB: 1975/07/20, 44 y.o.   MRN: [id number]

## 2019-08-24 NOTE — Progress Notes (Signed)
13 Days Post-Op lap washout and ileostomy Subjective: Not much appettite but tolerating PO.  Having good ostomy function with imodium.  Has some lower abd burning pain.  CT looked better yesterday.  IR planning to remove LLQ drain Objective: Vital signs in last 24 hours: Temp:  [98.1 F (36.7 C)-98.2 F (36.8 C)] 98.1 F (36.7 C) (05/26 0636) Pulse Rate:  [97-108] 97 (05/26 0636) Resp:  [15-18] 18 (05/26 0636) BP: (127-150)/(69-93) 150/93 (05/26 0636) SpO2:  [95 %-98 %] 96 % (05/26 0636) Weight:  [94.2 kg] 94.2 kg (05/26 0500)   Intake/Output from previous day: 05/25 0701 - 05/26 0700 In: 1160.2 [P.O.:700; I.V.:260; IV Piggyback:200.2] Out: 2995 [Urine:2050; Drains:135; Stool:810] Intake/Output this shift: Total I/O In: 20 [Other:20] Out: 0  General appearance: alert and cooperative GI: normal findings: soft, non-distended Surgical JP: brownish fluid Ostomy: beefy red, with good output Incision: no significant drainage  Lab Results:  Recent Labs    08/22/19 0350  WBC 13.8*  HGB 7.2*  HCT 23.5*  PLT 837*   BMET Recent Labs    08/22/19 0350 08/23/19 0350  NA 139 139  K 4.0 3.9  CL 103 102  CO2 25 27  GLUCOSE 100* 102*  BUN 17 14  CREATININE 0.69 0.77  CALCIUM 8.0* 8.0*   PT/INR No results for input(s): LABPROT, INR in the last 72 hours. ABG No results for input(s): PHART, HCO3 in the last 72 hours.  Invalid input(s): PCO2, PO2  MEDS, Scheduled . Chlorhexidine Gluconate Cloth  6 each Topical Daily  . chlorthalidone  12.5 mg Oral Daily  . enoxaparin (LOVENOX) injection  40 mg Subcutaneous Q24H  . ferrous sulfate  325 mg Oral BID WC  . fluticasone furoate-vilanterol  1 puff Inhalation Daily   And  . umeclidinium bromide  1 puff Inhalation Daily  . gabapentin  400 mg Oral TID  . hydrocortisone cream   Topical BID  . loperamide  2 mg Oral TID AC  . oxybutynin  5 mg Oral BID  . pantoprazole  40 mg Oral QHS  . polycarbophil  625 mg Oral TID  . sodium  chloride flush  10-40 mL Intracatheter Q12H  . sodium chloride flush  5 mL Intracatheter Q8H  . Teriflunomide  1 tablet Oral Daily    Studies/Results: CT ABDOMEN PELVIS W CONTRAST  Result Date: 08/23/2019 CLINICAL DATA:  44 year old with the complex surgical history including robotic assisted sigmoidectomy with lysis of adhesions and follow-up washout, drain placement and ileostomy creation. Total of 3 CT-guided drains were placed on 08/17/2019 for postoperative fluid collections. EXAM: CT ABDOMEN AND PELVIS WITH CONTRAST TECHNIQUE: Multidetector CT imaging of the abdomen and pelvis was performed using the standard protocol following bolus administration of intravenous contrast. CONTRAST:  111mL OMNIPAQUE IOHEXOL 300 MG/ML  SOLN COMPARISON:  CT 08/16/2019 FINDINGS: Lower chest: Small amount of right pleural fluid. Air bronchograms and consolidation in the right lower lobe. Small amount of consolidation in the anterior left lower lobe is similar to the comparison CT. Patchy atelectasis at the left lung base. Hepatobiliary: Cholecystectomy.  Normal appearance of the liver. Pancreas: No evidence for pancreatic inflammation. There is a 4 mm hypodensity along the anterior aspect of the pancreatic body on sequence 2 image 37. Similar finding was present on 12/13/2013 and likely represents a benign etiology. Spleen: Normal in size without focal abnormality. Adrenals/Urinary Tract: 2.0 cm nodule in the left adrenal gland has minimally changed since 2015 and likely represents a benign adenoma. Normal appearance of  the right adrenal gland. Contrast in the renal collecting systems bilaterally. No hydronephrosis. Small amount of fluid in a decompressed urinary bladder. Cannot exclude wall thickening along the dome of the bladder adjacent to pelvic fluid collection and surgical drain. Findings along the bladder dome are similar to the prior examination. Stomach/Bowel: Anastomotic clips in the sigmoid colon. Rectum is  mildly distended with high-density material. Sigmoid colon and left colon are decompressed. Oral contrast in the proximal descending colon, transverse colon and right colon. Again noted is a right abdominal ileostomy. No significant small bowel dilatation. Normal appearance of the stomach. Vascular/Lymphatic: Evidence for a central line terminating near the SVC and right atrium junction. No significant atherosclerotic calcifications in the aorta or iliac arteries. Negative for an abdominal aortic aneurysm. Venous structures are unremarkable. No significant abdominopelvic lymphadenopathy. Reproductive: Stable appearance of the uterus. Collection in the region of the right adnexa contains gas and similar to the previous examination. Other: Surgical change in the right lower abdomen is still present. This drain extends posteriorly and the tip is at the level of the sacrum. Again noted is a small amount of air and gas in the right upper pelvis adjacent to the drain on sequence 2, image 80. This collection is poorly defined, roughly measures 6.3 x 2.7 cm and previously measured 6.0 x 3.4 cm. Air-fluid collection near the tip of the surgical drain in the posterior pelvis that measures 4.0 cm in the AP dimension and this is similar to the previous examination. Previously, there were 3 distinct air-fluid collections in the anterior abdomen which have been decompressed with percutaneous drains. The collection in the upper mid abdomen with a drain measures 3.3 x 2.2 cm and the abscess previously measured 4.6 x 4.5 cm. Small amount of residual gas and fluid associated with this collection and drain. Second drain in the left upper abdomen and that fluid collection has nearly resolved. Previously that abscess collection measured 5.8 x 4.3 cm. Third CT drain is located in the lower abdomen just left of midline. The abscess in this area has markedly decreased in size but there is a small residual collection just cephalad to the  drain that measures roughly 2.2 x 1.5 cm. Small residual interloop abscess on sequence 2 image 45 measures 3.7 x 2.2 cm and previously measured 3.8 x 2.0 cm. Small fluid collections near the pancreatic tail have decreased in size in the left upper abdomen. Small residual fluid collections in the right abdomen on sequence 2 image 43. Index fluid collection in the right abdomen measures 3.4 x 2.0 cm and previously measured 3.8 x 2.0 cm. Persistent stranding and edema in the abdominal mesentery. Additional small anterior abdominal abscess on sequence 2, image 60 at the midline measures up to 1.8 cm and previously measured 2.2 cm. Small fluid collection associated with the surgical drain along the bladder dome measures roughly 2.9 cm and previously measured 3.4 cm. Evidence for additional interloop abscesses or fluid collections in the lower abdomen on sequence 2 image 71. Musculoskeletal: Severe disc space narrowing with endplate changes at X33443. No acute bone abnormality. IMPRESSION: 1. CT-guided drains are well positioned. The abscess collections associated with the drains have markedly decreased in size. 2. Multiple small abscess collections throughout the abdomen and pelvis. Most of these small collections have decreased in size from the exam on 08/16/1999. No new abscess collections. 3. Persistent air-fluid collections associated with the surgical drain in the pelvis. Fluid associated the surgical drain has slightly decreased in size.  4. Small right pleural effusion. Persistent patchy consolidation in the lower lungs, right side greater than left. Findings could represent atelectasis but cannot exclude infectious etiology. 5. Stable incidental findings involving the pancreas and left adrenal gland as described. Electronically Signed   By: Markus Daft M.D.   On: 08/23/2019 14:43    Assessment: s/p Procedure(s): XI ROBOT ASSISTED SIGMOIDECTOMY FIREFLY INJECTION Patient Active Problem List   Diagnosis Date  Noted  . Ileostomy in place [facility]) 08/20/2019  . Postoperative anemia due to acute blood loss 08/20/2019  . 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 Anemia of chronic disease 06/25/2019  . Change in bowel habits 06/25/2019  . IUD threads lost 01/26/2019  . History of abnormal cervical Pap smear 01/25/2019  . Diverticulitis of large intestine with perforation and abscess 12/30/2018  . Hx of diverticulitis of colon 12/24/2018  . Dyspepsia 12/24/2018  . Non-intractable vomiting 12/24/2018  . Abnormal CT scan, pelvis 12/24/2018  . Urinary dysfunction 12/30/2017  . Varicose veins of bilateral lower extremities with pain 10/28/2017  . Low grade squamous intraepithelial lesion (LGSIL) on cervical Pap smear 10/29/2016  . 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 Essential hypertension 04/22/2016  . Other headache syndrome 02/05/2016  . Vitamin D deficiency 10/09/2015  . Other fatigue 10/09/2015  . Dysesthesia 04/11/2015  . Depression with anxiety 04/11/2015  . Gait disturbance 12/13/2014  . Insomnia 12/13/2014  . Multiple sclerosis ([place]) 08/30/2012    Anastomotic leak, s/p washout, drainage and diversion  Plan: Stable, progressing appropriately CT guided drain management per IR Cont to watch PO intake off TPN Minimize IVF's, SL PICC Zosyn stopped 3/25.  Cont Eraxis until Sat Incentive spirometry Ambulate TID    LOS: 20 days     .Rosario Adie, MD [facility], [place]    08/24/2019 11:51 AM

## 2019-08-25 NOTE — Consult Note (Signed)
[place] Nurse ostomy follow up Stoma type/location: RLQ, ileostomy Stomal assessment/size: 1 1/2" round, budded, pink Peristomal assessment: dip in abdominal topography at 3 and 9 o'clock  Treatment options for stomal/peristomal skin: using 2" barrier ring. Staff crusted skin with ostomy powder due to skin irritation from leakage yesterday.  Patient self reports they changed 4x, however bedside staff did not reach out to our team Output liquid green, output down. On Imodium  Ostomy pouching: 2pc. 2 3/4" in place, good seal. No evidence of pending leakage  Education provided:  Discussed pouch change frequency with patient 2-3 x per week based on the fact she has an ileostomy  Discussed diet; daily activities including showers Reviewed emptying and cleaning spout with a wick. Patient self reports she has been assisting with emptying  Reviewed measuring stoma; drew pattern and allowed patient to cut new skin barrier. We will transition to flex convex with next pouch change and add the belt back.  She cut new skin barrier but did need reinforcement to make cut edge less jagged. Discussed options for pouch purchase and HHRN roles.  Answered patient's questions.  Patient's brother at the bedside.  Enrolled patient in [place] Start Discharge program: Yes  [place] Nurse will follow along with you for continued support with ostomy teaching and care [place] MSN, [place], [place], [place], [place]

## 2019-08-25 NOTE — Progress Notes (Signed)
Referring Physician(s): [name]  Supervising Physician: Sandi Mariscal  Patient Status:  [facility] - In-pt  Chief Complaint: "Belly pain"  Subjective:  History of perforated diverticulitis s/p robotic assisted sigmoidectomy with lysis of adhesions in OR 08/04/2019 by Dr. [name]; complicated by anastomotic leak s/p diagnostic laparoscopy with washout, right pelvic drain placement, and ileostomy creation in OR 08/11/2019 by Dr. [name]; further complicated by development of multiple intra-abdominal fluid collections s/p intra-abdominal drain placement x3 in IR 08/17/2019 by Dr. Pascal Lux; s/p removal of left abdominal drain (#4) 08/24/2019 (patient currently has 2 IR drains). Patient awake and alert laying in bed. Complains of generalized "belly pain"- states much improvement in this following pain medication administration. Left abdominal drains x2 c/d/i.   Allergies: Sulfamethoxazole-trimethoprim and Sulfa antibiotics  Medications: Prior to Admission medications   Medication Sig Start Date End Date Taking? Authorizing Provider  AUBAGIO 14 MG TABS TAKE 1 TABLET BY MOUTH  DAILY Patient taking differently: Take 14 mg by mouth daily.  02/28/19  Yes Sater, Nanine Means, MD  chlorthalidone (HYGROTON) 25 MG tablet Take 12.5 mg by mouth daily.   Yes [provider]  cholecalciferol (VITAMIN D3) 25 MCG (1000 UT) tablet Take 1,000 Units by mouth daily.   Yes [provider]  ferrous gluconate (FERGON) 324 MG tablet TAKE 1 TABLET(324 MG) BY MOUTH DAILY WITH BREAKFAST Patient taking differently: Take 324 mg by mouth daily with breakfast.  05/18/19  Yes Mansouraty, Telford Nab., MD  folic acid (FOLVITE) 1 MG tablet TAKE 1 TABLET(1 MG) BY MOUTH DAILY Patient taking differently: Take 1 mg by mouth daily.  07/04/19  Yes Mansouraty, Telford Nab., MD  LORazepam (ATIVAN) 1 MG tablet Take 1 mg by mouth 2 (two) times daily as needed for anxiety.  12/15/18  Yes [provider]  omeprazole  (PRILOSEC) 40 MG capsule TAKE 1 CAPSULE(40 MG) BY MOUTH DAILY BEFORE BREAKFAST 08/03/19  Yes Mansouraty, Telford Nab., MD  oxybutynin (DITROPAN) 5 MG tablet TAKE 1 TABLET(5 MG) BY MOUTH TWICE DAILY Patient taking differently: Take 5 mg by mouth 2 (two) times daily.  04/13/19  Yes Sater, Nanine Means, MD  potassium chloride (MICRO-K) 10 MEQ CR capsule Take 10 mEq by mouth every morning. 06/29/19  Yes [provider]  Probiotic Product (PROBIOTIC PO) Take 1 capsule by mouth daily.   Yes [provider]  TRELEGY ELLIPTA 100-62.5-25 MCG/INH AEPB Inhale 1 puff into the lungs daily. 02/10/19  Yes [provider]  acetaminophen (TYLENOL) 325 MG tablet Take 2 tablets (650 mg total) by mouth every 6 (six) hours as needed for mild pain (or Fever >/= 101). 11/05/18   Samuella Cota, MD  cyclobenzaprine (FLEXERIL) 5 MG tablet Take 1 tablet (5 mg total) by mouth at bedtime as needed. Patient taking differently: Take 5 mg by mouth at bedtime as needed for muscle spasms.  12/26/16   Sater, Nanine Means, MD  gabapentin (NEURONTIN) 100 MG capsule Take 100 mg by mouth daily as needed (nerve pain).  05/19/18   [provider]  levonorgestrel (MIRENA) 20 MCG/24HR IUD 1 each by Intrauterine route once. 11/14/15   [provider]  meloxicam (MOBIC) 15 MG tablet Take 1 tablet (15 mg total) by mouth daily. Patient taking differently: Take 15 mg by mouth daily as needed for pain.  12/26/16   Sater, Nanine Means, MD  Polyvinyl Alcohol-Povidone (REFRESH OP) Place 2 drops into both eyes 4 (four) times daily as needed (dryness).    [provider]  Vital Signs: BP 135/78 (BP Location: Left Arm)   Pulse 94   Temp 97.8 F (36.6 C) (Oral)   Resp 18   Ht 5\' 6"  (1.676 m)   Wt 207 lb 10.8 oz (94.2 kg)   LMP 08/08/2019   SpO2 96%   BMI 33.52 kg/m   Physical Exam Vitals and nursing note reviewed.  Constitutional:      General: She is not in acute distress.    Appearance: Normal  appearance.  Pulmonary:     Effort: Pulmonary effort is normal. No respiratory distress.  Abdominal:     Comments: (+) ileostomy. (+) right pelvic surgical drain. Left abdominal drain sites x2 without tenderness, erythema, drainage, or active bleeding; drain #2 with approximately 10 cc purulent drainage with debris in suction bulb; drain #3 with approximately 10 cc purulent drainage with debris in suction bulb.  Skin:    General: Skin is warm and dry.  Neurological:     Mental Status: She is alert and oriented to person, place, and time.  Psychiatric:        Mood and Affect: Mood normal.        Behavior: Behavior normal.     Imaging: CT ABDOMEN PELVIS W CONTRAST  Result Date: 08/23/2019 CLINICAL DATA:  44 year old with the complex surgical history including robotic assisted sigmoidectomy with lysis of adhesions and follow-up washout, drain placement and ileostomy creation. Total of 3 CT-guided drains were placed on 08/17/2019 for postoperative fluid collections. EXAM: CT ABDOMEN AND PELVIS WITH CONTRAST TECHNIQUE: Multidetector CT imaging of the abdomen and pelvis was performed using the standard protocol following bolus administration of intravenous contrast. CONTRAST:  145mL OMNIPAQUE IOHEXOL 300 MG/ML  SOLN COMPARISON:  CT 08/16/2019 FINDINGS: Lower chest: Small amount of right pleural fluid. Air bronchograms and consolidation in the right lower lobe. Small amount of consolidation in the anterior left lower lobe is similar to the comparison CT. Patchy atelectasis at the left lung base. Hepatobiliary: Cholecystectomy.  Normal appearance of the liver. Pancreas: No evidence for pancreatic inflammation. There is a 4 mm hypodensity along the anterior aspect of the pancreatic body on sequence 2 image 37. Similar finding was present on 12/13/2013 and likely represents a benign etiology. Spleen: Normal in size without focal abnormality. Adrenals/Urinary Tract: 2.0 cm nodule in the left adrenal gland  has minimally changed since 2015 and likely represents a benign adenoma. Normal appearance of the right adrenal gland. Contrast in the renal collecting systems bilaterally. No hydronephrosis. Small amount of fluid in a decompressed urinary bladder. Cannot exclude wall thickening along the dome of the bladder adjacent to pelvic fluid collection and surgical drain. Findings along the bladder dome are similar to the prior examination. Stomach/Bowel: Anastomotic clips in the sigmoid colon. Rectum is mildly distended with high-density material. Sigmoid colon and left colon are decompressed. Oral contrast in the proximal descending colon, transverse colon and right colon. Again noted is a right abdominal ileostomy. No significant small bowel dilatation. Normal appearance of the stomach. Vascular/Lymphatic: Evidence for a central line terminating near the SVC and right atrium junction. No significant atherosclerotic calcifications in the aorta or iliac arteries. Negative for an abdominal aortic aneurysm. Venous structures are unremarkable. No significant abdominopelvic lymphadenopathy. Reproductive: Stable appearance of the uterus. Collection in the region of the right adnexa contains gas and similar to the previous examination. Other: Surgical change in the right lower abdomen is still present. This drain extends posteriorly and the tip is at the level of the sacrum. Again  noted is a small amount of air and gas in the right upper pelvis adjacent to the drain on sequence 2, image 80. This collection is poorly defined, roughly measures 6.3 x 2.7 cm and previously measured 6.0 x 3.4 cm. Air-fluid collection near the tip of the surgical drain in the posterior pelvis that measures 4.0 cm in the AP dimension and this is similar to the previous examination. Previously, there were 3 distinct air-fluid collections in the anterior abdomen which have been decompressed with percutaneous drains. The collection in the upper mid abdomen  with a drain measures 3.3 x 2.2 cm and the abscess previously measured 4.6 x 4.5 cm. Small amount of residual gas and fluid associated with this collection and drain. Second drain in the left upper abdomen and that fluid collection has nearly resolved. Previously that abscess collection measured 5.8 x 4.3 cm. Third CT drain is located in the lower abdomen just left of midline. The abscess in this area has markedly decreased in size but there is a small residual collection just cephalad to the drain that measures roughly 2.2 x 1.5 cm. Small residual interloop abscess on sequence 2 image 45 measures 3.7 x 2.2 cm and previously measured 3.8 x 2.0 cm. Small fluid collections near the pancreatic tail have decreased in size in the left upper abdomen. Small residual fluid collections in the right abdomen on sequence 2 image 43. Index fluid collection in the right abdomen measures 3.4 x 2.0 cm and previously measured 3.8 x 2.0 cm. Persistent stranding and edema in the abdominal mesentery. Additional small anterior abdominal abscess on sequence 2, image 60 at the midline measures up to 1.8 cm and previously measured 2.2 cm. Small fluid collection associated with the surgical drain along the bladder dome measures roughly 2.9 cm and previously measured 3.4 cm. Evidence for additional interloop abscesses or fluid collections in the lower abdomen on sequence 2 image 71. Musculoskeletal: Severe disc space narrowing with endplate changes at [id number]. No acute bone abnormality. IMPRESSION: 1. CT-guided drains are well positioned. The abscess collections associated with the drains have markedly decreased in size. 2. Multiple small abscess collections throughout the abdomen and pelvis. Most of these small collections have decreased in size from the exam on 08/16/1999. No new abscess collections. 3. Persistent air-fluid collections associated with the surgical drain in the pelvis. Fluid associated the surgical drain has slightly  decreased in size. 4. Small right pleural effusion. Persistent patchy consolidation in the lower lungs, right side greater than left. Findings could represent atelectasis but cannot exclude infectious etiology. 5. Stable incidental findings involving the pancreas and left adrenal gland as described. Electronically Signed   By: Markus Daft M.D.   On: 08/23/2019 14:43    Labs:  CBC: Recent Labs    08/16/19 0312 08/18/19 0332 08/20/19 0435 08/22/19 0350  WBC 18.0* 14.2* 15.0* 13.8*  HGB 7.5* 7.4* 7.1* 7.2*  HCT 23.9* 24.2* 23.3* 23.5*  PLT 481* 619* 736* 837*    COAGS: Recent Labs    11/01/18 1531 08/17/19 1040  INR 1.5* 1.1    BMP: Recent Labs    08/19/19 0429 08/21/19 0335 08/22/19 0350 08/23/19 0350  NA 137 140 139 139  K 4.3 4.1 4.0 3.9  CL 105 107 103 102  CO2 24 24 25 27   GLUCOSE 131* 113* 100* 102*  BUN 23* 24* 17 14  CALCIUM 8.0* 7.8* 8.0* 8.0*  CREATININE 0.65 0.69 0.69 0.77  GFRNONAA >60 >60 >60 >60  GFRAA >60 >  60 >60 >60    LIVER FUNCTION TESTS: Recent Labs    08/14/19 0346 08/15/19 0301 08/18/19 0332 08/22/19 0350  BILITOT 1.0 1.0 1.0 0.7  AST 20 22 30 28   ALT 11 13 19 20   ALKPHOS 76 81 86 109  PROT 5.1* 5.3* 6.0* 6.0*  ALBUMIN 1.7* 1.7* 1.8* 1.8*    Assessment and Plan:  History of perforated diverticulitis s/p robotic assisted sigmoidectomy with lysis of adhesions in OR 08/04/2019 by Dr. [name]; complicated by anastomotic leak s/p diagnostic laparoscopy with washout, right pelvic drain placement, and ileostomy creation in OR 08/11/2019 by Dr. [name]; further complicated by development of multiple intra-abdominal fluid collections s/p intra-abdominal drain placement x3 in IR 08/17/2019 by Dr. Pascal Lux; s/p removal of left abdominal drain (#4) 08/24/2019 (patient currently has 2 IR drains). Left abdominal drain (#2) stable with approximately 10 cc purulent drainage with debris in suction bulb (additional 10 cc output from drain in past 24 hours per  chart). Left abdominal drain (#3) stable with approximately 10 cc purulent drainage with debris in suction bulb (additional 22 cc output from drain in past 24 hours per chart). Continue current drain management- continue with TID flushes/monitor of output. Plan for repeat CT/possible drain injection when output <10 cc/day (assess for possible removal). Further plans per [facility]- appreciate and agree with management. IR to follow.   Electronically Signed: Earley Abide, PA-C 08/25/2019, 9:32 AM   I spent a total of 25 Minutes at the the patient's bedside AND on the patient's hospital floor or unit, greater than 50% of which was counseling/coordinating care for intraabdominal drain placement x3.

## 2019-08-25 NOTE — Progress Notes (Signed)
14 Days Post-Op lap washout and ileostomy Subjective: Not much appettite but tolerating some PO.  No ostomy output recorded yesterday.  Still has some lower abd pain, but improved. IR has removed LLQ drain Objective: Vital signs in last 24 hours: Temp:  [97.8 F (36.6 C)-98.2 F (36.8 C)] 97.8 F (36.6 C) (05/27 0600) Pulse Rate:  [94-104] 94 (05/27 0600) Resp:  [18] 18 (05/27 0600) BP: (129-137)/(75-78) 135/78 (05/27 0600) SpO2:  [90 %-96 %] 96 % (05/27 0600)   Intake/Output from previous day: 05/26 0701 - 05/27 0700 In: 531.6 [P.O.:200; I.V.:80; IV Piggyback:211.6] Out: 945 [Urine:900; Drains:45] Intake/Output this shift: Total I/O In: 10 [Other:10] Out: 250 [Urine:250] General appearance: alert and cooperative GI: normal findings: soft, non-distended Surgical JP: brownish fluid Ostomy: beefy red, with good output Incision: no significant drainage  Lab Results:  No results for input(s): WBC, HGB, HCT, PLT in the last 72 hours. BMET Recent Labs    08/23/19 0350  NA 139  K 3.9  CL 102  CO2 27  GLUCOSE 102*  BUN 14  CREATININE 0.77  CALCIUM 8.0*   PT/INR No results for input(s): LABPROT, INR in the last 72 hours. ABG No results for input(s): PHART, HCO3 in the last 72 hours.  Invalid input(s): PCO2, PO2  MEDS, Scheduled . Chlorhexidine Gluconate Cloth  6 each Topical Daily  . chlorthalidone  12.5 mg Oral Daily  . enoxaparin (LOVENOX) injection  40 mg Subcutaneous Q24H  . ferrous sulfate  325 mg Oral BID WC  . fluticasone furoate-vilanterol  1 puff Inhalation Daily   And  . umeclidinium bromide  1 puff Inhalation Daily  . gabapentin  400 mg Oral TID  . hydrocortisone cream   Topical BID  . loperamide  2 mg Oral TID AC  . oxybutynin  5 mg Oral BID  . pantoprazole  40 mg Oral QHS  . polycarbophil  625 mg Oral TID  . sodium chloride flush  10-40 mL Intracatheter Q12H  . sodium chloride flush  5 mL Intracatheter Q8H  . Teriflunomide  1 tablet Oral Daily     Studies/Results: No results found.  Assessment: s/p Procedure(s): XI ROBOT ASSISTED SIGMOIDECTOMY FIREFLY INJECTION Patient Active Problem List   Diagnosis Date Noted  . Ileostomy in place [facility]) 08/20/2019  . Postoperative anemia due to acute blood loss 08/20/2019  . 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 Anemia of chronic disease 06/25/2019  . Change in bowel habits 06/25/2019  . IUD threads lost 01/26/2019  . History of abnormal cervical Pap smear 01/25/2019  . Diverticulitis of large intestine with perforation and abscess 12/30/2018  . Hx of diverticulitis of colon 12/24/2018  . Dyspepsia 12/24/2018  . Non-intractable vomiting 12/24/2018  . Abnormal CT scan, pelvis 12/24/2018  . Urinary dysfunction 12/30/2017  . Varicose veins of bilateral lower extremities with pain 10/28/2017  . Low grade squamous intraepithelial lesion (LGSIL) on cervical Pap smear 10/29/2016  . 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 Essential hypertension 04/22/2016  . Other headache syndrome 02/05/2016  . Vitamin D deficiency 10/09/2015  . Other fatigue 10/09/2015  . Dysesthesia 04/11/2015  . Depression with anxiety 04/11/2015  . Gait disturbance 12/13/2014  . Insomnia 12/13/2014  . Multiple sclerosis ([place]) 08/30/2012    Anastomotic leak, s/p washout, drainage and diversion  Plan: Stable, progressing appropriately CT guided drain management per IR Cont to watch PO intake off TPN Minimize IVF's, SL PICC Zosyn stopped 3/25.  Cont Eraxis until  Sat Incentive spirometry Ambulate TID    LOS: 21 days     .Rosario Adie, MD [facility], [place]    08/25/2019 11:11 AM

## 2019-08-26 MED ORDER — HYDROMORPHONE HCL 1 MG/ML IJ SOLN
0.5000 mg | INTRAMUSCULAR | Status: DC | PRN
  Administered 2019-08-26 – 2019-08-27 (×3): 0.5 mg via INTRAVENOUS
  Filled 2019-08-26 (×3): qty 0.5

## 2019-08-26 NOTE — Progress Notes (Signed)
15 Days Post-Op lap washout and ileostomy Subjective:  tolerating a little more PO.  Min ostomy output recorded yesterday but she states she had a lot of trouble with bag leaking.  Still has some lower abd pain, but improved.  Objective: Vital signs in last 24 hours: Temp:  [97.8 F (36.6 C)-98.4 F (36.9 C)] 98 F (36.7 C) (05/28 [id number]) Pulse Rate:  [94-96] 94 (05/28 0638) Resp:  [18-20] 18 (05/28 [id number]) BP: (119-127)/(72-73) 120/73 (05/28 [id number]) SpO2:  [94 %-96 %] 96 % (05/28 [id number])   Intake/Output from previous day: 05/27 0701 - 05/28 0700 In: 599.3 [P.O.:180; I.V.:249.3; IV Piggyback:130] Out: 1390 [Urine:1100; Drains:15; Stool:275] Intake/Output this shift: No intake/output data recorded. General appearance: alert and cooperative GI: normal findings: soft, non-distended Surgical JP: brownish fluid Ostomy: beefy red, with good output Incision: no significant drainage  Lab Results:  No results for input(s): WBC, HGB, HCT, PLT in the last 72 hours. BMET No results for input(s): NA, K, CL, CO2, GLUCOSE, BUN, CREATININE, CALCIUM in the last 72 hours. PT/INR No results for input(s): LABPROT, INR in the last 72 hours. ABG No results for input(s): PHART, HCO3 in the last 72 hours.  Invalid input(s): PCO2, PO2  MEDS, Scheduled . Chlorhexidine Gluconate Cloth  6 each Topical Daily  . chlorthalidone  12.5 mg Oral Daily  . enoxaparin (LOVENOX) injection  40 mg Subcutaneous Q24H  . ferrous sulfate  325 mg Oral BID WC  . fluticasone furoate-vilanterol  1 puff Inhalation Daily   And  . umeclidinium bromide  1 puff Inhalation Daily  . gabapentin  400 mg Oral TID  . hydrocortisone cream   Topical BID  . loperamide  2 mg Oral TID AC  . oxybutynin  5 mg Oral BID  . pantoprazole  40 mg Oral QHS  . polycarbophil  625 mg Oral TID  . sodium chloride flush  10-40 mL Intracatheter Q12H  . sodium chloride flush  5 mL Intracatheter Q8H  . Teriflunomide  1 tablet Oral Daily     Studies/Results: No results found.  Assessment: s/p Procedure(s): XI ROBOT ASSISTED SIGMOIDECTOMY FIREFLY INJECTION Patient Active Problem List   Diagnosis Date Noted  . Ileostomy in place [facility]) 08/20/2019  . Postoperative anemia due to acute blood loss 08/20/2019  . 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 Anemia of chronic disease 06/25/2019  . Change in bowel habits 06/25/2019  . IUD threads lost 01/26/2019  . History of abnormal cervical Pap smear 01/25/2019  . Diverticulitis of large intestine with perforation and abscess 12/30/2018  . Hx of diverticulitis of colon 12/24/2018  . Dyspepsia 12/24/2018  . Non-intractable vomiting 12/24/2018  . Abnormal CT scan, pelvis 12/24/2018  . Urinary dysfunction 12/30/2017  . Varicose veins of bilateral lower extremities with pain 10/28/2017  . Low grade squamous intraepithelial lesion (LGSIL) on cervical Pap smear 10/29/2016  . 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 Essential hypertension 04/22/2016  . Other headache syndrome 02/05/2016  . Vitamin D deficiency 10/09/2015  . Other fatigue 10/09/2015  . Dysesthesia 04/11/2015  . Depression with anxiety 04/11/2015  . Gait disturbance 12/13/2014  . Insomnia 12/13/2014  . Multiple sclerosis ([place]) 08/30/2012    Anastomotic leak, s/p washout, drainage and diversion  Plan: Stable, progressing appropriately CT guided drain management per IR Cont to watch PO intake off TPN Minimize IVF's, SL PICC Zosyn stopped 3/25.  Cont Eraxis until Sat Repeat labs in AM Incentive spirometry Ambulate TID  LOS: 22 days     .Rosario Adie, MD [facility], [place]    08/26/2019 7:47 AM

## 2019-08-26 NOTE — Consult Note (Signed)
[place] Nurse ostomy follow up Stoma type/location: RLQ, end ileostomy Stomal assessment/size: 1 1 1/2" round, budded Peristomal assessment: dips in the skin at 3 and 9 o'clock  Treatment options for stomal/peristomal skin: using 2" barrier ring Output liquid seedy green-yellow Ostomy pouching: 1pc.flex convex with 2" barrier ring and belt Education provided:  WOC in for assessment. Patient self reports changing pouch yesterday over to flex convex pouch. She is not wearing the belt. Discussed rationale again for 2" barrier ring and convex pouch along with use of belt. She verbalized understanding. Will need assist with cutting skin barrier, she cut one for me yesterday. She is independent with emptying and cleaning the spout. Ordered 4 1pc flex convex and she has barrier rings in the room  Enrolled patient in [place] Start Discharge program: Yes  [facility] Nurse will follow along with you for continued support with ostomy teaching and care [place] MSN, [place], [place], [place], [place]

## 2019-08-26 NOTE — Progress Notes (Signed)
Referring Physician(s): [name]  Supervising Physician: Sandi Mariscal  Patient Status:  [facility] - In-pt  Chief Complaint: "Belly pain"  Subjective:  History of perforated diverticulitis s/p robotic assisted sigmoidectomy with lysis of adhesions in OR 08/04/2019 by Dr. [name]; complicated by anastomotic leak s/p diagnostic laparoscopy with washout, right pelvic drain placement, and ileostomy creation in OR 08/11/2019 by Dr. [name]; further complicated by development of multiple intra-abdominal fluid collections s/p intra-abdominal drain placement x3 in IR 08/17/2019 by Dr. Pascal Lux; s/p removal of left abdominal drain (#4) 08/24/2019 (patient currently has 2 IR drains). Patient awake and alert laying in bed. Complains of generalized "belly pain"- states much improvement in this following pain medication administration. Left abdominal drains x2 c/d/i.   Allergies: Sulfamethoxazole-trimethoprim and Sulfa antibiotics  Medications: Prior to Admission medications   Medication Sig Start Date End Date Taking? Authorizing Provider  AUBAGIO 14 MG TABS TAKE 1 TABLET BY MOUTH  DAILY Patient taking differently: Take 14 mg by mouth daily.  02/28/19  Yes Sater, Nanine Means, MD  chlorthalidone (HYGROTON) 25 MG tablet Take 12.5 mg by mouth daily.   Yes [provider]  cholecalciferol (VITAMIN D3) 25 MCG (1000 UT) tablet Take 1,000 Units by mouth daily.   Yes [provider]  ferrous gluconate (FERGON) 324 MG tablet TAKE 1 TABLET(324 MG) BY MOUTH DAILY WITH BREAKFAST Patient taking differently: Take 324 mg by mouth daily with breakfast.  05/18/19  Yes Mansouraty, Telford Nab., MD  folic acid (FOLVITE) 1 MG tablet TAKE 1 TABLET(1 MG) BY MOUTH DAILY Patient taking differently: Take 1 mg by mouth daily.  07/04/19  Yes Mansouraty, Telford Nab., MD  LORazepam (ATIVAN) 1 MG tablet Take 1 mg by mouth 2 (two) times daily as needed for anxiety.  12/15/18  Yes [provider]  omeprazole  (PRILOSEC) 40 MG capsule TAKE 1 CAPSULE(40 MG) BY MOUTH DAILY BEFORE BREAKFAST 08/03/19  Yes Mansouraty, Telford Nab., MD  oxybutynin (DITROPAN) 5 MG tablet TAKE 1 TABLET(5 MG) BY MOUTH TWICE DAILY Patient taking differently: Take 5 mg by mouth 2 (two) times daily.  04/13/19  Yes Sater, Nanine Means, MD  potassium chloride (MICRO-K) 10 MEQ CR capsule Take 10 mEq by mouth every morning. 06/29/19  Yes [provider]  Probiotic Product (PROBIOTIC PO) Take 1 capsule by mouth daily.   Yes [provider]  TRELEGY ELLIPTA 100-62.5-25 MCG/INH AEPB Inhale 1 puff into the lungs daily. 02/10/19  Yes [provider]  acetaminophen (TYLENOL) 325 MG tablet Take 2 tablets (650 mg total) by mouth every 6 (six) hours as needed for mild pain (or Fever >/= 101). 11/05/18   Samuella Cota, MD  cyclobenzaprine (FLEXERIL) 5 MG tablet Take 1 tablet (5 mg total) by mouth at bedtime as needed. Patient taking differently: Take 5 mg by mouth at bedtime as needed for muscle spasms.  12/26/16   Sater, Nanine Means, MD  gabapentin (NEURONTIN) 100 MG capsule Take 100 mg by mouth daily as needed (nerve pain).  05/19/18   [provider]  levonorgestrel (MIRENA) 20 MCG/24HR IUD 1 each by Intrauterine route once. 11/14/15   [provider]  meloxicam (MOBIC) 15 MG tablet Take 1 tablet (15 mg total) by mouth daily. Patient taking differently: Take 15 mg by mouth daily as needed for pain.  12/26/16   Sater, Nanine Means, MD  Polyvinyl Alcohol-Povidone (REFRESH OP) Place 2 drops into both eyes 4 (four) times daily as needed (dryness).    [provider]  Vital Signs: BP 120/73 (BP Location: Left Arm)   Pulse 94   Temp 98 F (36.7 C) (Oral)   Resp 18   Ht 5\' 6"  (1.676 m)   Wt 94.2 kg   LMP 08/08/2019   SpO2 96%   BMI 33.52 kg/m   Physical Exam Vitals and nursing note reviewed.  Constitutional:      General: She is not in acute distress.    Appearance: Normal appearance.    Pulmonary:     Effort: Pulmonary effort is normal. No respiratory distress.  Abdominal:     Comments: (+) ileostomy. (+) right pelvic surgical drain. Left abdominal drain sites x2 without tenderness, erythema, drainage, or active bleeding;  Still with cloudy purulent output  Skin:    General: Skin is warm and dry.  Neurological:     Mental Status: She is alert and oriented to person, place, and time.  Psychiatric:        Mood and Affect: Mood normal.        Behavior: Behavior normal.     Imaging: CT ABDOMEN PELVIS W CONTRAST  Result Date: 08/23/2019 CLINICAL DATA:  44 year old with the complex surgical history including robotic assisted sigmoidectomy with lysis of adhesions and follow-up washout, drain placement and ileostomy creation. Total of 3 CT-guided drains were placed on 08/17/2019 for postoperative fluid collections. EXAM: CT ABDOMEN AND PELVIS WITH CONTRAST TECHNIQUE: Multidetector CT imaging of the abdomen and pelvis was performed using the standard protocol following bolus administration of intravenous contrast. CONTRAST:  163mL OMNIPAQUE IOHEXOL 300 MG/ML  SOLN COMPARISON:  CT 08/16/2019 FINDINGS: Lower chest: Small amount of right pleural fluid. Air bronchograms and consolidation in the right lower lobe. Small amount of consolidation in the anterior left lower lobe is similar to the comparison CT. Patchy atelectasis at the left lung base. Hepatobiliary: Cholecystectomy.  Normal appearance of the liver. Pancreas: No evidence for pancreatic inflammation. There is a 4 mm hypodensity along the anterior aspect of the pancreatic body on sequence 2 image 37. Similar finding was present on 12/13/2013 and likely represents a benign etiology. Spleen: Normal in size without focal abnormality. Adrenals/Urinary Tract: 2.0 cm nodule in the left adrenal gland has minimally changed since 2015 and likely represents a benign adenoma. Normal appearance of the right adrenal gland. Contrast in the  renal collecting systems bilaterally. No hydronephrosis. Small amount of fluid in a decompressed urinary bladder. Cannot exclude wall thickening along the dome of the bladder adjacent to pelvic fluid collection and surgical drain. Findings along the bladder dome are similar to the prior examination. Stomach/Bowel: Anastomotic clips in the sigmoid colon. Rectum is mildly distended with high-density material. Sigmoid colon and left colon are decompressed. Oral contrast in the proximal descending colon, transverse colon and right colon. Again noted is a right abdominal ileostomy. No significant small bowel dilatation. Normal appearance of the stomach. Vascular/Lymphatic: Evidence for a central line terminating near the SVC and right atrium junction. No significant atherosclerotic calcifications in the aorta or iliac arteries. Negative for an abdominal aortic aneurysm. Venous structures are unremarkable. No significant abdominopelvic lymphadenopathy. Reproductive: Stable appearance of the uterus. Collection in the region of the right adnexa contains gas and similar to the previous examination. Other: Surgical change in the right lower abdomen is still present. This drain extends posteriorly and the tip is at the level of the sacrum. Again noted is a small amount of air and gas in the right upper pelvis adjacent to the drain on sequence 2, image 80.  This collection is poorly defined, roughly measures 6.3 x 2.7 cm and previously measured 6.0 x 3.4 cm. Air-fluid collection near the tip of the surgical drain in the posterior pelvis that measures 4.0 cm in the AP dimension and this is similar to the previous examination. Previously, there were 3 distinct air-fluid collections in the anterior abdomen which have been decompressed with percutaneous drains. The collection in the upper mid abdomen with a drain measures 3.3 x 2.2 cm and the abscess previously measured 4.6 x 4.5 cm. Small amount of residual gas and fluid associated  with this collection and drain. Second drain in the left upper abdomen and that fluid collection has nearly resolved. Previously that abscess collection measured 5.8 x 4.3 cm. Third CT drain is located in the lower abdomen just left of midline. The abscess in this area has markedly decreased in size but there is a small residual collection just cephalad to the drain that measures roughly 2.2 x 1.5 cm. Small residual interloop abscess on sequence 2 image 45 measures 3.7 x 2.2 cm and previously measured 3.8 x 2.0 cm. Small fluid collections near the pancreatic tail have decreased in size in the left upper abdomen. Small residual fluid collections in the right abdomen on sequence 2 image 43. Index fluid collection in the right abdomen measures 3.4 x 2.0 cm and previously measured 3.8 x 2.0 cm. Persistent stranding and edema in the abdominal mesentery. Additional small anterior abdominal abscess on sequence 2, image 60 at the midline measures up to 1.8 cm and previously measured 2.2 cm. Small fluid collection associated with the surgical drain along the bladder dome measures roughly 2.9 cm and previously measured 3.4 cm. Evidence for additional interloop abscesses or fluid collections in the lower abdomen on sequence 2 image 71. Musculoskeletal: Severe disc space narrowing with endplate changes at [id number]. No acute bone abnormality. IMPRESSION: 1. CT-guided drains are well positioned. The abscess collections associated with the drains have markedly decreased in size. 2. Multiple small abscess collections throughout the abdomen and pelvis. Most of these small collections have decreased in size from the exam on 08/16/1999. No new abscess collections. 3. Persistent air-fluid collections associated with the surgical drain in the pelvis. Fluid associated the surgical drain has slightly decreased in size. 4. Small right pleural effusion. Persistent patchy consolidation in the lower lungs, right side greater than left. Findings  could represent atelectasis but cannot exclude infectious etiology. 5. Stable incidental findings involving the pancreas and left adrenal gland as described. Electronically Signed   By: Markus Daft M.D.   On: 08/23/2019 14:43    Labs:  CBC: Recent Labs    08/16/19 0312 08/18/19 0332 08/20/19 0435 08/22/19 0350  WBC 18.0* 14.2* 15.0* 13.8*  HGB 7.5* 7.4* 7.1* 7.2*  HCT 23.9* 24.2* 23.3* 23.5*  PLT 481* 619* 736* 837*    COAGS: Recent Labs    11/01/18 1531 08/17/19 1040  INR 1.5* 1.1    BMP: Recent Labs    08/19/19 0429 08/21/19 0335 08/22/19 0350 08/23/19 0350  NA 137 140 139 139  K 4.3 4.1 4.0 3.9  CL 105 107 103 102  CO2 24 24 25 27   GLUCOSE 131* 113* 100* 102*  BUN 23* 24* 17 14  CALCIUM 8.0* 7.8* 8.0* 8.0*  CREATININE 0.65 0.69 0.69 0.77  GFRNONAA >60 >60 >60 >60  GFRAA >60 >60 >60 >60    LIVER FUNCTION TESTS: Recent Labs    08/14/19 0346 08/15/19 0301 08/18/19 0332 08/22/19 0350  BILITOT 1.0 1.0 1.0 0.7  AST 20 22 30 28   ALT 11 13 19 20   ALKPHOS 76 81 86 109  PROT 5.1* 5.3* 6.0* 6.0*  ALBUMIN 1.7* 1.7* 1.8* 1.8*    Assessment and Plan:  History of perforated diverticulitis s/p robotic assisted sigmoidectomy with lysis of adhesions in OR 08/04/2019 by Dr. [name]; complicated by anastomotic leak s/p diagnostic laparoscopy with washout, right pelvic drain placement, and ileostomy creation in OR 08/11/2019 by Dr. [name]; further complicated by development of multiple intra-abdominal fluid collections s/p intra-abdominal drain placement x3 in IR 08/17/2019 by Dr. Pascal Lux; s/p removal of left abdominal drain (#4) 08/24/2019 (patient currently has 2 IR drains). Left abdominal drain (sup) stable with approximately 10 cc purulent drainage with debris in suction bulb Left abdominal drain (inf) stable with approximately 10 cc purulent drainage with debris in suction bulb  Continue current drain management- continue with TID flushes/monitor of output. Plan for repeat  CT/possible drain injection when output <10 cc/day (assess for possible removal). Further plans per [facility]- appreciate and agree with management. IR to follow.   Electronically Signed: Ascencion Dike, PA-C 08/26/2019, 10:48 AM   I spent a total of 25 Minutes at the the patient's bedside AND on the patient's hospital floor or unit, greater than 50% of which was counseling/coordinating care for intraabdominal drain placement x3.

## 2019-08-26 NOTE — Progress Notes (Signed)
Physical Therapy Treatment Patient Details Name: Sandra Bonilla MRN: [id number] DOB: [birth year] Today's Date: 08/26/2019    History of Present Illness Pt is 44 yo female with hx of MS.  She presented to hospital with diverticulitis.  Pt s/p cystoscopy with ureteral stent, and XI robot assisted sigmoidectomy and LOA on 5/6.  Pt with complications of anastomotic leak and s/p diagnostic laparoscopy with wash out, drain placement, and ileostomy on 5/13.    PT Comments    Pt reports ability to ambulate in hallway with nursing using IV pole when pain is low. Pt tolerates exercises prior to gait training, decreased SLR to maintain abdominal and back comfort with good results. Pt with increased pain from 2 to 6/10 with mobility, limited ambulation distance. Pt with JP drains secured and ostomy not leaking during session, pt very anxious about ostomy leaking but was happy when noticed it wasn't leaking with ambulation. Patient will benefit from continued physical therapy in hospital and recommendations below to increase strength, balance, endurance for safe ADLs and gait.   Follow Up Recommendations  Home health PT     Equipment Recommendations  None recommended by PT    Recommendations for Other Services       Precautions / Restrictions Precautions Precautions: Fall Precaution Comments: 3 JP drains; ostomy Restrictions Weight Bearing Restrictions: No    Mobility  Bed Mobility Overal bed mobility: Modified Independent  Supine to sit: Modified independent (Device/Increase time) Sit to supine: Modified independent (Device/Increase time)  General bed mobility comments: increased time, drains pinned to gown for safety  Transfers Overall transfer level: Modified independent Equipment used: None Transfers: Sit to/from Stand Sit to Stand: Modified independent (Device/Increase time)  General transfer comment: increased time, drains pinned to gown, slow to rise with good steadiness and no  physical assist required  Ambulation/Gait Ambulation/Gait assistance: Supervision Gait Distance (Feet): 150 Feet Assistive device: IV Pole Gait Pattern/deviations: Step-through pattern;Decreased stride length Gait velocity: decreased   General Gait Details: IV pole used to steady self and brace self to minimize pain, no near falls, good safety with maneuvering IV straight and with turns; limited 2* pain increasing to 6/10   Stairs             Wheelchair Mobility    Modified Rankin (Stroke Patients Only)       Balance Overall balance assessment: Needs assistance   Sitting balance-Leahy Scale: Good Sitting balance - Comments: seated EOB   Standing balance support: During functional activity;Single extremity supported Standing balance-Leahy Scale: Fair Standing balance comment: with IV pole       Cognition Arousal/Alertness: Awake/alert Behavior During Therapy: WFL for tasks assessed/performed Overall Cognitive Status: Within Functional Limits for tasks assessed  General Comments: very motivated and positive attitude      Exercises General Exercises - Lower Extremity Ankle Circles/Pumps: AROM;Both;15 reps Quad Sets: AROM;Both;15 reps Straight Leg Raises: AROM;Both;5 reps    General Comments General comments (skin integrity, edema, etc.): 3 JP drains pinned to gown, ostomy not leaking      Pertinent Vitals/Pain Pain Assessment: 0-10 Pain Location: abdomen 2 at rest, 6 with mobility Pain Descriptors / Indicators: Discomfort;Sore Pain Intervention(s): Limited activity within patient's tolerance;Monitored during session;Premedicated before session;Repositioned;Ice applied    Home Living                 Prior Function            PT Goals (current goals can now be found in the care plan section) Acute  Rehab PT Goals Patient Stated Goal: return home PT Goal Formulation: With patient Time For Goal Achievement: 08/27/19 Potential to Achieve Goals:  Good Progress towards PT goals: Progressing toward goals    Frequency    Min 3X/week      PT Plan Current plan remains appropriate    Co-evaluation              AM-PAC PT "6 Clicks" Mobility   Outcome Measure  Help needed turning from your back to your side while in a flat bed without using bedrails?: None Help needed moving from lying on your back to sitting on the side of a flat bed without using bedrails?: None Help needed moving to and from a bed to a chair (including a wheelchair)?: None Help needed standing up from a chair using your arms (e.g., wheelchair or bedside chair)?: None Help needed to walk in hospital room?: A Little Help needed climbing 3-5 steps with a railing? : A Little 6 Click Score: 22    End of Session   Activity Tolerance: Patient tolerated treatment well Patient left: with call bell/phone within reach;in bed Nurse Communication: Mobility status PT Visit Diagnosis: Muscle weakness (generalized) (M62.81);Other abnormalities of gait and mobility (R26.89)     Time: [id number] PT Time Calculation (min) (ACUTE ONLY): 13 min  Charges:  $Therapeutic Exercise: 8-22 mins                      Tori Danna Sewell PT, DPT 08/26/19, 10:20 AM

## 2019-08-27 LAB — CBC
HCT: 28.8 % — ABNORMAL LOW (ref 36.0–46.0)
Hemoglobin: 8.6 g/dL — ABNORMAL LOW (ref 12.0–15.0)
MCH: 26.3 pg (ref 26.0–34.0)
MCHC: 29.9 g/dL — ABNORMAL LOW (ref 30.0–36.0)
MCV: 88.1 fL (ref 80.0–100.0)
Platelets: 669 10*3/uL — ABNORMAL HIGH (ref 150–400)
RBC: 3.27 MIL/uL — ABNORMAL LOW (ref 3.87–5.11)
RDW: 18.6 % — ABNORMAL HIGH (ref 11.5–15.5)
WBC: 9.3 10*3/uL (ref 4.0–10.5)
nRBC: 0 % (ref 0.0–0.2)

## 2019-08-27 MED ORDER — HYDROMORPHONE HCL 1 MG/ML IJ SOLN
0.5000 mg | Freq: Four times a day (QID) | INTRAMUSCULAR | Status: DC | PRN
  Administered 2019-08-27 – 2019-08-29 (×5): 0.5 mg via INTRAVENOUS
  Filled 2019-08-27 (×5): qty 0.5

## 2019-08-27 NOTE — Progress Notes (Signed)
16 Days Post-Op lap washout and ileostomy Subjective:  tolerating more PO.  Min ostomy output recorded yesterday.  Less trouble with bag leaking.  Still has some lower abd pain, but improving each day.  Objective: Vital signs in last 24 hours: Temp:  [97.8 F (36.6 C)-98 F (36.7 C)] 98 F (36.7 C) (05/29 0657) Pulse Rate:  [89-94] 89 (05/29 0657) Resp:  [17-18] 18 (05/29 0657) BP: (107-120)/(65-75) 120/75 (05/29 0657) SpO2:  [92 %-96 %] 96 % (05/29 0657) Weight:  [90.7 kg-94 kg] 90.7 kg (05/29 0657)   Intake/Output from previous day: 05/28 0701 - 05/29 0700 In: 41 [P.O.:960; I.V.:120; IV Piggyback:130] Out: 1625 [Urine:1400; Drains:25; Stool:200] Intake/Output this shift: Total I/O In: -  Out: 100 [Stool:100] General appearance: alert and cooperative GI: normal findings: soft, non-distended Surgical JP: brownish fluid Ostomy: beefy red, with good output Incision: no significant drainage  Lab Results:  Recent Labs    08/27/19 0438  WBC 9.3  HGB 8.6*  HCT 28.8*  PLT 669*   BMET No results for input(s): NA, K, CL, CO2, GLUCOSE, BUN, CREATININE, CALCIUM in the last 72 hours. PT/INR No results for input(s): LABPROT, INR in the last 72 hours. ABG No results for input(s): PHART, HCO3 in the last 72 hours.  Invalid input(s): PCO2, PO2  MEDS, Scheduled . Chlorhexidine Gluconate Cloth  6 each Topical Daily  . chlorthalidone  12.5 mg Oral Daily  . enoxaparin (LOVENOX) injection  40 mg Subcutaneous Q24H  . ferrous sulfate  325 mg Oral BID WC  . fluticasone furoate-vilanterol  1 puff Inhalation Daily   And  . umeclidinium bromide  1 puff Inhalation Daily  . gabapentin  400 mg Oral TID  . hydrocortisone cream   Topical BID  . loperamide  2 mg Oral TID AC  . oxybutynin  5 mg Oral BID  . pantoprazole  40 mg Oral QHS  . polycarbophil  625 mg Oral TID  . sodium chloride flush  10-40 mL Intracatheter Q12H  . sodium chloride flush  5 mL Intracatheter Q8H  .  Teriflunomide  1 tablet Oral Daily    Studies/Results: No results found.  Assessment: s/p Procedure(s): XI ROBOT ASSISTED SIGMOIDECTOMY FIREFLY INJECTION Patient Active Problem List   Diagnosis Date Noted  . Ileostomy in place [facility]) 08/20/2019  . Postoperative anemia due to acute blood loss 08/20/2019  . 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 Anemia of chronic disease 06/25/2019  . Change in bowel habits 06/25/2019  . IUD threads lost 01/26/2019  . History of abnormal cervical Pap smear 01/25/2019  . Diverticulitis of large intestine with perforation and abscess 12/30/2018  . Hx of diverticulitis of colon 12/24/2018  . Dyspepsia 12/24/2018  . Non-intractable vomiting 12/24/2018  . Abnormal CT scan, pelvis 12/24/2018  . Urinary dysfunction 12/30/2017  . Varicose veins of bilateral lower extremities with pain 10/28/2017  . Low grade squamous intraepithelial lesion (LGSIL) on cervical Pap smear 10/29/2016  . 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 Essential hypertension 04/22/2016  . Other headache syndrome 02/05/2016  . Vitamin D deficiency 10/09/2015  . Other fatigue 10/09/2015  . Dysesthesia 04/11/2015  . Depression with anxiety 04/11/2015  . Gait disturbance 12/13/2014  . Insomnia 12/13/2014  . Multiple sclerosis ([place]) 08/30/2012    Anastomotic leak, s/p washout, drainage and diversion  Plan: Stable, progressing appropriately CT guided drain management per IR Cont to watch PO intake off TPN Minimize IVF's, SL PICC Zosyn stopped 3/25.  D/c Eraxis today Incentive spirometry Ambulate TID    LOS: 23 days     .Rosario Adie, MD [facility], [place]    08/27/2019 8:36 AM

## 2019-08-28 MED ORDER — OXYCODONE HCL 5 MG PO TABS
5.0000 mg | ORAL_TABLET | Freq: Four times a day (QID) | ORAL | Status: DC | PRN
  Administered 2019-08-28 – 2019-08-29 (×4): 10 mg via ORAL
  Filled 2019-08-28 (×4): qty 2

## 2019-08-28 NOTE — Progress Notes (Signed)
17 Days Post-Op lap washout and ileostomy Subjective:  tolerating more PO.   Seems to be less trouble with bag leaking.  Still has some lower abd pain, but improving each day.  Objective: Vital signs in last 24 hours: Temp:  [97.5 F (36.4 C)-97.8 F (36.6 C)] 97.8 F (36.6 C) (05/30 0637) Pulse Rate:  [75-96] 86 (05/30 0637) Resp:  [16-18] 16 (05/30 0637) BP: (112-121)/(78-88) 120/78 (05/30 0637) SpO2:  [93 %-97 %] 94 % (05/30 0637)   Intake/Output from previous day: 05/29 0701 - 05/30 0700 In: 1100 [P.O.:1080] Out: 625 [Urine:200; Drains:75; Stool:350] Intake/Output this shift: No intake/output data recorded. General appearance: alert and cooperative GI: normal findings: soft, non-distended Surgical JP: brownish fluid Ostomy: beefy red, with good output Incision: no significant drainage  Lab Results:  Recent Labs    08/27/19 0438  WBC 9.3  HGB 8.6*  HCT 28.8*  PLT 669*   BMET No results for input(s): NA, K, CL, CO2, GLUCOSE, BUN, CREATININE, CALCIUM in the last 72 hours. PT/INR No results for input(s): LABPROT, INR in the last 72 hours. ABG No results for input(s): PHART, HCO3 in the last 72 hours.  Invalid input(s): PCO2, PO2  MEDS, Scheduled . Chlorhexidine Gluconate Cloth  6 each Topical Daily  . chlorthalidone  12.5 mg Oral Daily  . enoxaparin (LOVENOX) injection  40 mg Subcutaneous Q24H  . ferrous sulfate  325 mg Oral BID WC  . fluticasone furoate-vilanterol  1 puff Inhalation Daily   And  . umeclidinium bromide  1 puff Inhalation Daily  . gabapentin  400 mg Oral TID  . hydrocortisone cream   Topical BID  . loperamide  2 mg Oral TID AC  . oxybutynin  5 mg Oral BID  . pantoprazole  40 mg Oral QHS  . polycarbophil  625 mg Oral TID  . sodium chloride flush  10-40 mL Intracatheter Q12H  . sodium chloride flush  5 mL Intracatheter Q8H  . Teriflunomide  1 tablet Oral Daily    Studies/Results: No results found.  Assessment: s/p Procedure(s): XI  ROBOT ASSISTED SIGMOIDECTOMY FIREFLY INJECTION Patient Active Problem List   Diagnosis Date Noted  . Ileostomy in place [facility]) 08/20/2019  . Postoperative anemia due to acute blood loss 08/20/2019  . 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 Anemia of chronic disease 06/25/2019  . Change in bowel habits 06/25/2019  . IUD threads lost 01/26/2019  . History of abnormal cervical Pap smear 01/25/2019  . Diverticulitis of large intestine with perforation and abscess 12/30/2018  . Hx of diverticulitis of colon 12/24/2018  . Dyspepsia 12/24/2018  . Non-intractable vomiting 12/24/2018  . Abnormal CT scan, pelvis 12/24/2018  . Urinary dysfunction 12/30/2017  . Varicose veins of bilateral lower extremities with pain 10/28/2017  . Low grade squamous intraepithelial lesion (LGSIL) on cervical Pap smear 10/29/2016  . 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 Essential hypertension 04/22/2016  . Other headache syndrome 02/05/2016  . Vitamin D deficiency 10/09/2015  . Other fatigue 10/09/2015  . Dysesthesia 04/11/2015  . Depression with anxiety 04/11/2015  . Gait disturbance 12/13/2014  . Insomnia 12/13/2014  . Multiple sclerosis ([place]) 08/30/2012    Anastomotic leak, s/p washout, drainage and diversion  Plan: Stable, progressing appropriately CT guided drain management per IR Cont to watch PO intake off TPN Minimize IVF's, SL PICC Zosyn stopped 3/25.  Eraxis stopped 5/29 Incentive spirometry Ambulate TID  Anticipate d/c tomorrow  LOS: 24 days     .  Rosario Adie, MD [facility], [place]    08/28/2019 7:47 AM

## 2019-08-28 NOTE — Plan of Care (Signed)
  Problem: Health Behavior/Discharge Planning: Goal: Ability to manage health-related needs will improve Outcome: Progressing   Problem: Clinical Measurements: Goal: Ability to maintain clinical measurements within normal limits will improve Outcome: Progressing Goal: Will remain free from infection Outcome: Progressing Goal: Diagnostic test results will improve Outcome: Progressing   

## 2019-08-29 MED ORDER — CALCIUM POLYCARBOPHIL 625 MG PO TABS: 625.0000 mg | ORAL_TABLET | Freq: Three times a day (TID) | ORAL | Status: DC

## 2019-08-29 MED ORDER — OXYCODONE HCL 5 MG PO TABS: 5.0000 mg | ORAL_TABLET | Freq: Four times a day (QID) | ORAL | 0 refills | Status: DC | PRN

## 2019-08-29 MED ORDER — LOPERAMIDE HCL 2 MG PO CAPS: 2.0000 mg | ORAL_CAPSULE | Freq: Three times a day (TID) | ORAL | 0 refills | Status: DC

## 2019-08-29 NOTE — TOC Transition Note (Signed)
Transition of Care [facility]) - CM/SW Discharge Note   Patient Details  Name: Sandra Bonilla MRN: [id number] Date of Birth: 1975-08-19  Transition of Care [facility]) CM/SW Contact:  Lia Hopping, [place] Phone Number: 08/29/2019, 10:16 AM   Clinical Narrative:    Patient agreeable to cane. Cane ordered through [place]. Delivered to bedside.      Barriers to Discharge: Barriers Resolved   Patient Goals and CMS Choice Patient states their goals for this hospitalization and ongoing recovery are:: to go home with home health CMS Medicare.gov Compare Post Acute Care list provided to:: Patient Choice offered to / list presented to : Patient  Discharge Placement                       Discharge Plan and Services   Discharge Planning Services: CM Consult Post Acute Care Choice: Home Health          DME Arranged: Kasandra Knudsen DME Agency: [employer]     Representative spoke with at DME Agency: [facility] HH Arranged: PT, RN [place] Agency: [place] Date [place]: 08/29/19 Time [place]: [id number] Representative spoke with at [place]: [place] ([place]) Interventions     Readmission Risk Interventions No flowsheet data found.

## 2019-08-29 NOTE — Progress Notes (Signed)
D/c instructions given to patient. Patient had no questions. NT or writer will wheel patient out once family come to pick her up

## 2019-08-29 NOTE — TOC Transition Note (Addendum)
Transition of Care [facility]) - CM/SW Discharge Note   Patient Details  Name: Sandra Bonilla MRN: [id number] Date of Birth: January 09, 1976  Transition of Care [facility]) CM/SW Contact:  Lia Hopping, [place] Phone Number: 08/29/2019, 9:32 AM   Clinical Narrative:    CSW confirm with liaison Enos Fling Home health [facility]) will see the patient tomorrow for start of care RN/PT.  Patient has learn to how to manage her colostomy and drains since being placed and feels comfortable to manage them at home. She expresses excitement about her discharge. Patient brother and cousin will be available to assist her with care if needed.  Patient declines RW, reports she can walk and get up to the bathroom without it.  No other needs identified.      Barriers to Discharge: Barriers Resolved   Patient Goals and CMS Choice Patient states their goals for this hospitalization and ongoing recovery are:: to go home with home health CMS Medicare.gov Compare Post Acute Care list provided to:: Patient Choice offered to / list presented to : Patient  Discharge Placement                       Discharge Plan and Services   Discharge Planning Services: CM Consult Post Acute Care Choice: Home Health                    HH Arranged: PT, RN [facility] Agency: [place] Date [place]: 08/29/19 Time [place]: [id number] Representative spoke with at [place]: [place] ([place]) Interventions     Readmission Risk Interventions No flowsheet data found.

## 2019-08-29 NOTE — Progress Notes (Signed)
Pt refused dressing change this morning, saying it was changed late yesterday. Dressing clean and dry .

## 2019-08-29 NOTE — Discharge Summary (Signed)
Physician Discharge Summary  Patient ID: Sandra Bonilla MRN: [id number] DOB/AGE: [birth year] 44 y.o.  Admit date: 08/04/2019 Discharge date: 08/29/2019  Admission Diagnoses: diverticulitis   Discharge Diagnoses:  Principal Problem:   Diverticulitis of large intestine with perforation and abscess Active Problems:   Multiple sclerosis ([place])   Insomnia   Depression with anxiety   Essential hypertension   Anemia of chronic disease   Ileostomy in place [facility])   Postoperative anemia due to acute blood loss   Discharged Condition: good  Hospital Course: Patient was admitted for elective sigmoidectomy due to severe diverticulitis with stricture.  Her early postoperative course was uneventful but then she developed significant right-sided pain and a CT scan showed an anastomotic leak.  She was taken back to the operating room at approximately postop day 7 for diverting ileostomy and drain placement.  She developed intra-abdominal abscesses which were drained by interventional radiology.  1 of these drains has since been removed.  She was placed on TPN after her second surgery to assist with nutrition.  She was slowly weaned off of this and onto a regular diet.  When she was tolerating a diet, ambulating without difficulty, her pain was controlled with oral medications and she was able to manage her ostomy by herself, she was felt in stable condition for discharge to home.  She will have a home health nurse to assist her in transitioning her care.  I will see her back in the office in approximately 10 days.  Consults: None  Significant Diagnostic Studies: labs: cbc, bmet, CT scan  Treatments: IV hydration, antibiotics: Zosyn, analgesia: Oxycodone, TPN, therapies: PT and surgery: see above  Discharge Exam: Blood pressure 115/80, pulse 86, temperature 97.6 F (36.4 C), temperature source Oral, resp. rate 17, height 5\' 6"  (1.676 m), weight 90.7 kg, last menstrual period 08/08/2019, SpO2 96  %. General appearance: alert and cooperative GI: normal findings: soft, non-tender Incision/Wound: clean, dry, intact  Disposition: Discharge disposition: 01-Home or Self Care        Allergies as of 08/29/2019      Reactions   Sulfamethoxazole-trimethoprim Rash, Hives   Sulfa Antibiotics Other (See Comments)      Medication List    TAKE these medications   acetaminophen 325 MG tablet Commonly known as: TYLENOL Take 2 tablets (650 mg total) by mouth every 6 (six) hours as needed for mild pain (or Fever >/= 101).   Aubagio 14 MG Tabs Generic drug: Teriflunomide TAKE 1 TABLET BY MOUTH  DAILY What changed: how much to take   chlorthalidone 25 MG tablet Commonly known as: HYGROTON Take 12.5 mg by mouth daily.   cholecalciferol 25 MCG (1000 UNIT) tablet Commonly known as: VITAMIN D3 Take 1,000 Units by mouth daily.   cyclobenzaprine 5 MG tablet Commonly known as: FLEXERIL Take 1 tablet (5 mg total) by mouth at bedtime as needed. What changed: reasons to take this   ferrous gluconate 324 MG tablet Commonly known as: FERGON TAKE 1 TABLET(324 MG) BY MOUTH DAILY WITH BREAKFAST What changed: See the new instructions.   folic acid 1 MG tablet Commonly known as: FOLVITE TAKE 1 TABLET(1 MG) BY MOUTH DAILY What changed: See the new instructions.   gabapentin 100 MG capsule Commonly known as: NEURONTIN Take 100 mg by mouth daily as needed (nerve pain).   levonorgestrel 20 MCG/24HR IUD Commonly known as: MIRENA 1 each by Intrauterine route once.   loperamide 2 MG capsule Commonly known as: IMODIUM Take 1 capsule (2 mg  total) by mouth 3 (three) times daily before meals.   LORazepam 1 MG tablet Commonly known as: ATIVAN Take 1 mg by mouth 2 (two) times daily as needed for anxiety.   meloxicam 15 MG tablet Commonly known as: MOBIC Take 1 tablet (15 mg total) by mouth daily. What changed:   when to take this  reasons to take this   omeprazole 40 MG  capsule Commonly known as: PRILOSEC TAKE 1 CAPSULE(40 MG) BY MOUTH DAILY BEFORE BREAKFAST   oxybutynin 5 MG tablet Commonly known as: DITROPAN TAKE 1 TABLET(5 MG) BY MOUTH TWICE DAILY What changed:   how much to take  how to take this  when to take this  additional instructions   oxyCODONE 5 MG immediate release tablet Commonly known as: Oxy IR/ROXICODONE Take 1-2 tablets (5-10 mg total) by mouth every 6 (six) hours as needed for moderate pain or severe pain.   polycarbophil 625 MG tablet Commonly known as: FIBERCON Take 1 tablet (625 mg total) by mouth 3 (three) times daily.   potassium chloride 10 MEQ CR capsule Commonly known as: MICRO-K Take 10 mEq by mouth every morning.   PROBIOTIC PO Take 1 capsule by mouth daily.   REFRESH OP Place 2 drops into both eyes 4 (four) times daily as needed (dryness).   Trelegy Ellipta 100-62.5-25 MCG/INH Aepb Generic drug: Fluticasone-Umeclidin-Vilant Inhale 1 puff into the lungs daily.      Follow-up Information    [name], MD. Schedule an appointment as soon as possible for a visit in 2 week(s).   Specialty: General Surgery Contact information: [address] [place] [address] [phone]        Home, Medi Follow up.   Why: agency will provide home health physical therapy and nurse Contact information: [place] [address] [phone]           Signed: Rosario Adie [id number], 9:17 AM

## 2019-08-29 NOTE — Progress Notes (Signed)
Physical Therapy Treatment Patient Details Name: Sandra Bonilla MRN: [id number] DOB: [birth year] Today's Date: 08/29/2019    History of Present Illness Pt is 44 yo female with hx of MS.  She presented to hospital with diverticulitis.  Pt s/p cystoscopy with ureteral stent, and XI robot assisted sigmoidectomy and LOA on 5/6.  Pt with complications of anastomotic leak and s/p diagnostic laparoscopy with wash out, drain placement, and ileostomy on 5/13.    PT Comments    Pt remains motivated, fatigues with amb with use of cane rather than IV pole. Gait guarded however no overt LOB.  Rest break given, initiated standing exercises, fatigued after heel raises, pt requested to lie down.  Continue to recommend HHPT  Follow Up Recommendations  Home health PT;Supervision - Intermittent     Equipment Recommendations  Cane(delivered by SW, PT adjusted )    Recommendations for Other Services       Precautions / Restrictions Precautions Precautions: Fall Precaution Comments: 3 JP drains; ostomy Restrictions Weight Bearing Restrictions: No    Mobility  Bed Mobility Overal bed mobility: Modified Independent Bed Mobility: Supine to Sit;Sit to Supine              Transfers Overall transfer level: Modified independent Equipment used: None                Ambulation/Gait Ambulation/Gait assistance: Supervision;Min guard Gait Distance (Feet): 120 Feet Assistive device: Straight cane Gait Pattern/deviations: Step-through pattern;Decreased stride length     General Gait Details: pt with guarded gait, decr velocity, min/guard for safety--pt is used to amb with IV pole support    Stairs             Wheelchair Mobility    Modified Rankin (Stroke Patients Only)       Balance   Sitting-balance support: No upper extremity supported;Feet supported Sitting balance-Leahy Scale: Good     Standing balance support: During functional activity;Single extremity  supported Standing balance-Leahy Scale: Fair Standing balance comment: reliant on at least unilateral UE support for dynamic tasks                             Cognition Arousal/Alertness: Awake/alert Behavior During Therapy: WFL for tasks assessed/performed Overall Cognitive Status: Within Functional Limits for tasks assessed                                 General Comments: very motivated and positive attitude      Exercises General Exercises - Lower Extremity Heel Raises: AROM;Strengthening;Both;10 reps;Standing    General Comments        Pertinent Vitals/Pain Pain Assessment: 0-10 Pain Score: 2  Pain Location: abdomen 2 at rest, 3-4 with mobility Pain Descriptors / Indicators: Discomfort;Sore Pain Intervention(s): Limited activity within patient's tolerance;Monitored during session;Premedicated before session    Home Living                      Prior Function            PT Goals (current goals can now be found in the care plan section) Acute Rehab PT Goals Patient Stated Goal: return home PT Goal Formulation: With patient Time For Goal Achievement: 08/27/19 Potential to Achieve Goals: Good Progress towards PT goals: Progressing toward goals    Frequency    Min 3X/week      PT Plan Current  plan remains appropriate    Co-evaluation              AM-PAC PT "6 Clicks" Mobility   Outcome Measure  Help needed turning from your back to your side while in a flat bed without using bedrails?: None Help needed moving from lying on your back to sitting on the side of a flat bed without using bedrails?: None Help needed moving to and from a bed to a chair (including a wheelchair)?: None Help needed standing up from a chair using your arms (e.g., wheelchair or bedside chair)?: None Help needed to walk in hospital room?: A Little Help needed climbing 3-5 steps with a railing? : A Little 6 Click Score: 22    End of Session  Equipment Utilized During Treatment: Gait belt Activity Tolerance: Patient tolerated treatment well Patient left: in bed;with call bell/phone within reach Nurse Communication: Mobility status PT Visit Diagnosis: Muscle weakness (generalized) (M62.81);Other abnormalities of gait and mobility (R26.89)     Time: [id number]) PT Time Calculation (min) (ACUTE ONLY): 23 min  Charges:  $Gait Training: 23-37 mins                     Lidie Glade, PT   Acute Rehab Dept [facility]): [id number]   08/29/2019    [facility] 08/29/2019, 10:13 AM

## 2019-08-29 NOTE — Consult Note (Addendum)
[place] Nurse ostomy follow up Pt denies leaking this weekend since Convex pouch was applied. Stoma type/location: RLQ, end ileostomy Stomal assessment/size: 1 1 1/2" round, budded, slightly above skin level Peristomal assessment: dips in the skin at 3 and 9 o'clock  Treatment options for stomal/peristomal skin: using 2" barrier ring Output: mod amt brown liquid stool Ostomy pouching: 1pc.flex convex with 2" barrier ring and belt Education provided:  Pt assisted with pouch change using barrier ring and flexible convex pouch and belt.  She is able to open and close to empty without assistance.  Reviewed ordering supplies and pouching routines and dietary precautions.  Ordered 5 1pc flex convex and barrier rings to room for use after discharge. and she has barrier rings in the room Enrolled patient in [place] Start Discharge program: Yes; previously [place] Nurse will follow along with you for continued support with ostomy teaching and care [facility] MSN, [place], [place], [place], [place]

## 2019-08-31 ENCOUNTER — Other Ambulatory Visit: Payer: Self-pay | Admitting: General Surgery

## 2019-08-31 DIAGNOSIS — K5720 Diverticulitis of large intestine with perforation and abscess without bleeding: Secondary | ICD-10-CM

## 2019-08-31 DIAGNOSIS — K651 Peritoneal abscess: Secondary | ICD-10-CM

## 2019-09-06 ENCOUNTER — Ambulatory Visit
Admission: RE | Admit: 2019-09-06 | Discharge: 2019-09-06 | Disposition: A | Discharge status: Home / Self Care | Payer: BC Managed Care – PPO | Source: Ambulatory Visit | Attending: General Surgery | Admitting: General Surgery

## 2019-09-06 ENCOUNTER — Encounter: Payer: Self-pay | Admitting: Radiology

## 2019-09-06 DIAGNOSIS — K651 Peritoneal abscess: Secondary | ICD-10-CM

## 2019-09-06 DIAGNOSIS — K5720 Diverticulitis of large intestine with perforation and abscess without bleeding: Secondary | ICD-10-CM

## 2019-09-06 HISTORY — PX: IR RADIOLOGIST EVAL & MGMT: IMG5224

## 2019-09-06 MED ORDER — IOPAMIDOL (ISOVUE-300) INJECTION 61%
100.0000 mL | Freq: Once | INTRAVENOUS | Status: AC | PRN
  Administered 2019-09-06: 100 mL via INTRAVENOUS

## 2019-09-06 NOTE — Progress Notes (Signed)
Referring Physician(s): Thomas,Alicia  Chief Complaint: The patient is seen in follow up today s/p 3 abd abscess drain placed in IR 08/17/19 (only 2 remain)  History of present illness:  44 year old with the complex surgical history including robotic assisted sigmoidectomy with lysis of adhesions and follow-up washout, drain placement and ileostomy creation. Total of 3 CT-guided drains were placed on 08/17/2019 for postoperative fluid Collections.  Left lateral abscess drain (#1) was removed in its entirety 5/26 after review of 5/25 CT with Dr Laurence Ferrari She was discharged from hospital 5/31 with left abdominal drains #2 and #3 (along with one surgical drain in right abdomen).  Pt here today for evaluation and CT Possible drain removals  She is NOT flushing drains OP is minimal-- almost none in 2-3 days now OP is cloudy yellow Denies fever chills Denies abd pain   Past Medical History:  Diagnosis Date   Anemia 05/2019   iron infusions   Anxiety    Asthma    Cervical high risk human papillomavirus (HPV) DNA test positive 09/2015   Cholecystitis 2006   GALBLADDER REMOVED   Depression    Diverticulitis    Diverticulosis    Fibroadenoma 2016   RIGHT BREAST   Head injury 06/25/2016   History of mammogram 11/2014   FIBROADENOMA   History of Papanicolaou smear of cervix 01/24/13; 10/15/15   -/-; -/+   Hypertension    Menorrhagia 02/01/2010   D/C HYSTEROSCOPY ENDO POLYP   MS (multiple sclerosis) ([place])    Ovarian cyst 9/15; 2015-2016   LEFT - WENT TO [facility]; RIGHT   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    Past Surgical History:  Procedure Laterality Date   BIOPSY  03/21/2019   Procedure: BIOPSY;  Surgeon: [name], MD;  Location: [place];  Service: Gastroenterology;;   BREAST BIOPSY Right 2016   Fibroadenoma   CHOLECYSTECTOMY  2006   COLON RESECTION N/A 08/11/2019   Procedure: DIAGNOSTIC LAPAROSCOPY WITH [place] OUT,  DRAIN PLACEMENT, AND LOOP ILEOSTOMY CREATION;  Surgeon: [name], MD;  Location: [facility];  Service: General;  Laterality: N/A;   COLONOSCOPY WITH PROPOFOL N/A 03/21/2019   Procedure: COLONOSCOPY WITH PROPOFOL;  Surgeon: [name], MD;  Location: [place];  Service: Gastroenterology;  Laterality: N/A;  ultraslim colonoscope    CYSTOSCOPY WITH INJECTION N/A 08/04/2019   Procedure: FIREFLY INJECTION;  Surgeon: Ardis Hughs, MD;  Location: [facility];  Service: Urology;  Laterality: N/A;   DILATION AND CURETTAGE, DIAGNOSTIC / THERAPEUTIC  02/01/2010   D/C HYSTEROSCOPY ENDO POLYP; [facility]   ENDOSCOPIC MUCOSAL RESECTION  03/21/2019   Procedure: ENDOSCOPIC MUCOSAL RESECTION;  Surgeon: Rush Landmark Telford Nab., MD;  Location: [place];  Service: Gastroenterology;;   ESOPHAGOGASTRODUODENOSCOPY (EGD) WITH PROPOFOL N/A 03/21/2019   Procedure: ESOPHAGOGASTRODUODENOSCOPY (EGD) WITH PROPOFOL;  Surgeon: [name], MD;  Location: [place];  Service: Gastroenterology;  Laterality: N/A;   EYE SURGERY     Lasik   FLEXIBLE SIGMOIDOSCOPY  07/31/2016   Procedure: FLEXIBLE SIGMOIDOSCOPY;  Surgeon: [name], MD;  Location: [facility];  Service: Endoscopy;;   HEMOSTASIS CLIP PLACEMENT  03/21/2019   Procedure: HEMOSTASIS CLIP PLACEMENT;  Surgeon: [name], MD;  Location: [place];  Service: Gastroenterology;;   HYSTEROSCOPY  02/01/2010   ENDO POLYP - [facility]   IR RADIOLOGIST EVAL & MGMT  11/16/2018   IR RADIOLOGIST EVAL & MGMT  09/06/2019   SCLEROTHERAPY  03/21/2019   Procedure: Clide Deutscher;  Surgeon: Mansouraty, Telford Nab., MD;  Location: [facility];  Service: Gastroenterology;;    Allergies: Sulfamethoxazole-trimethoprim and Sulfa antibiotics  Medications: Prior to Admission medications   Medication Sig Start Date End Date Taking? Authorizing Provider  acetaminophen (TYLENOL) 325 MG tablet Take 2 tablets (650 mg total) by mouth every  6 (six) hours as needed for mild pain (or Fever >/= 101). 11/05/18   Samuella Cota, MD  AUBAGIO 14 MG TABS TAKE 1 TABLET BY MOUTH  DAILY Patient taking differently: Take 14 mg by mouth daily.  02/28/19   Sater, Nanine Means, MD  chlorthalidone (HYGROTON) 25 MG tablet Take 12.5 mg by mouth daily.    [provider]  cholecalciferol (VITAMIN D3) 25 MCG (1000 UT) tablet Take 1,000 Units by mouth daily.    [provider]  cyclobenzaprine (FLEXERIL) 5 MG tablet Take 1 tablet (5 mg total) by mouth at bedtime as needed. Patient taking differently: Take 5 mg by mouth at bedtime as needed for muscle spasms.  12/26/16   Sater, Nanine Means, MD  ferrous gluconate (FERGON) 324 MG tablet TAKE 1 TABLET(324 MG) BY MOUTH DAILY WITH BREAKFAST Patient taking differently: Take 324 mg by mouth daily with breakfast.  05/18/19   Mansouraty, Telford Nab., MD  folic acid (FOLVITE) 1 MG tablet TAKE 1 TABLET(1 MG) BY MOUTH DAILY Patient taking differently: Take 1 mg by mouth daily.  07/04/19   Mansouraty, Telford Nab., MD  gabapentin (NEURONTIN) 100 MG capsule Take 100 mg by mouth daily as needed (nerve pain).  05/19/18   [provider]  levonorgestrel (MIRENA) 20 MCG/24HR IUD 1 each by Intrauterine route once. 11/14/15   [provider]  loperamide (IMODIUM) 2 MG capsule Take 1 capsule (2 mg total) by mouth 3 (three) times daily before meals. 5/73/22   [name], MD  LORazepam (ATIVAN) 1 MG tablet Take 1 mg by mouth 2 (two) times daily as needed for anxiety.  12/15/18   [provider]  meloxicam (MOBIC) 15 MG tablet Take 1 tablet (15 mg total) by mouth daily. Patient taking differently: Take 15 mg by mouth daily as needed for pain.  12/26/16   Sater, Nanine Means, MD  omeprazole (PRILOSEC) 40 MG capsule TAKE 1 CAPSULE(40 MG) BY MOUTH DAILY BEFORE BREAKFAST 08/03/19   Mansouraty, Telford Nab., MD  oxybutynin (DITROPAN) 5 MG tablet TAKE 1 TABLET(5 MG) BY MOUTH TWICE DAILY Patient taking  differently: Take 5 mg by mouth 2 (two) times daily.  04/13/19   Sater, Nanine Means, MD  oxyCODONE (OXY IR/ROXICODONE) 5 MG immediate release tablet Take 1-2 tablets (5-10 mg total) by mouth every 6 (six) hours as needed for moderate pain or severe pain. 0/25/42   [name], MD  polycarbophil (FIBERCON) 625 MG tablet Take 1 tablet (625 mg total) by mouth 3 (three) times daily. 10/03/21   [name], MD  Polyvinyl Alcohol-Povidone (REFRESH OP) Place 2 drops into both eyes 4 (four) times daily as needed (dryness).    [provider]  potassium chloride (MICRO-K) 10 MEQ CR capsule Take 10 mEq by mouth every morning. 06/29/19   [provider]  Probiotic Product (PROBIOTIC PO) Take 1 capsule by mouth daily.    [provider]  TRELEGY ELLIPTA 100-62.5-25 MCG/INH AEPB Inhale 1 puff into the lungs daily. 02/10/19   [provider]     Family History  Problem Relation Age of Onset   Breast cancer Maternal Aunt 42       has contact   Rheum arthritis Mother  Hypertension Mother    Thyroid disease Mother        HYPOTHYROIDISM   Lung cancer Mother 59   Heart disease Father    Diabetes Father    Hypertension Father    Cancer Paternal Grandmother        MELANOMA OF SKIN   Cancer Cousin        KIDNEY-COUSIN   Colon cancer Neg Hx    Stomach cancer Neg Hx    Pancreatic cancer Neg Hx    Esophageal cancer Neg Hx    Inflammatory bowel disease Neg Hx    Rectal cancer Neg Hx     Social History   Socioeconomic History   Marital status: Divorced    Spouse name: Not on file   Number of children: 0   Years of education: 14   Highest education level: Not on file  Occupational History    Employer: [employer]  Tobacco Use   Smoking status: Former Smoker    Years: 10.00    Quit date: 2010    Years since quitting: 11.4   Smokeless tobacco: Never Used  Substance and Sexual Activity   Alcohol use: No   Drug use: No   Sexual activity:  Not Currently    Partners: Male    Birth control/protection: I.U.D.    Comment: Mirena  Other Topics Concern   Not on file  Social History Narrative   Not on file   Social Determinants of Health   Financial Resource Strain:    Difficulty of Paying Living Expenses:   Food Insecurity:    Worried About Charity fundraiser in the Last Year:    Arboriculturist in the Last Year:   Transportation Needs:    Film/video editor (Medical):    Lack of Transportation (Non-Medical):   Physical Activity:    Days of Exercise per Week:    Minutes of Exercise per Session:   Stress:    Feeling of Stress :   Social Connections:    Frequency of Communication with Friends and Family:    Frequency of Social Gatherings with Friends and Family:    Attends Religious Services:    Active Member of Clubs or Organizations:    Attends Archivist Meetings:    Marital Status:      Vital Signs: LMP 08/10/2019 (Exact Date)   Afebrile  Physical Exam Skin:    General: Skin is warm and dry.     Comments: Sites of IR drains #2 and #3 are clean and dry Injection of each only reveals small blush of contrast in area of previous collections NO fistula to bowel per Dr Laurence Ferrari  Drains #2 and #3 were removed in entirety-- without complication per Dr Laurence Ferrari New dressings placed over each     Imaging: CT ABDOMEN PELVIS W CONTRAST  Result Date: 09/06/2019 CLINICAL DATA:  44 year old Sandra Bonilla with a complex surgical history including robotic assisted sigmoidectomy with lysis of adhesions and follow-up washout, drain placement and ileostomy creation. A total of 3 CT-guided drains were placed by interventional radiology on 08/17/2019 for postoperative fluid collections. One of the drainage catheters has since been removed and 2 remain in place. EXAM: CT ABDOMEN AND PELVIS WITH CONTRAST TECHNIQUE: Multidetector CT imaging of the abdomen and pelvis was performed using the standard  protocol following bolus administration of intravenous contrast. CONTRAST:  124mL ISOVUE-300 IOPAMIDOL (ISOVUE-300) INJECTION 61% COMPARISON:  Prior CT scan of the abdomen and pelvis 08/23/2019 FINDINGS: Lower chest:  Interval resolution of small right-sided pleural effusion. There are some residual areas of linear atelectasis versus scarring in the lung bases. The lungs are otherwise clear. The visualized cardiac structures are normal in size. Unremarkable distal thoracic esophagus. Hepatobiliary: Normal hepatic contour and morphology. Geographic hypoattenuation in the left hemi-liver adjacent to the fissure for the falciform ligament is nonspecific but most suggestive of benign focal fatty infiltration. Otherwise, no suspicious appearing liver lesions. The gallbladder is surgically absent. No intra or extrahepatic biliary ductal dilatation. Pancreas: Unremarkable. No pancreatic ductal dilatation or surrounding inflammatory changes. Spleen: Normal in size without focal abnormality. Adrenals/Urinary Tract: Stable 1.5 cm left adrenal nodule. The right adrenal gland remains normal. No evidence of hydronephrosis, nephrolithiasis or enhancing renal mass. The ureters and bladder are unremarkable. Stomach/Bowel: Surgical changes of prior sigmoidectomy with primary colo colonic anastomosis. Right lower quadrant diverting loop ileostomy again noted. No significant parastomal hernia. No evidence of obstruction. No focal bowel wall thickening. Vascular/Lymphatic: No significant vascular findings are present. No enlarged abdominal or pelvic lymph nodes. Reproductive: Uterus and bilateral adnexa are unremarkable. Other: Midline abdominal drainage catheter remains in good position. No surrounding fluid collection. Second percutaneous drainage catheter just left of midline also remains in good position. No significant undrained fluid present. Small pockets of mesenteric fluid in the left upper quadrant remain present and are  essentially unchanged. No evidence of surrounding inflammation or internal gas to suggest the presence of infection. Trace amount of fluid present about the surgical drainage catheter which enters via the right lower quadrant and lies in the anatomic pelvis. Musculoskeletal: No acute fracture or aggressive appearing lytic or blastic osseous lesion. IMPRESSION: 1. Significant interval improvement in intra-abdominal fluid collections. No significant remaining fluid collection present around either of the percutaneous drainage catheters. 2. Trace fluid present surrounding the surgically placed drainage catheter which lays in the anatomic pelvis. 3. Surgical changes of prior sigmoidectomy with primary colo colonic anastomosis and diverting right lower quadrant loop ileostomy. No evidence of obstruction or inflammation. Electronically Signed   By: Jacqulynn Cadet M.D.   On: 09/06/2019 13:25   IR Radiologist Eval & Mgmt  Result Date: 09/06/2019 Please refer to notes tab for details about interventional procedure. (Op Note)   Labs:  CBC: Recent Labs    08/18/19 0332 08/20/19 0435 08/22/19 0350 08/27/19 0438  WBC 14.2* 15.0* 13.8* 9.3  HGB 7.4* 7.1* 7.2* 8.6*  HCT 24.2* 23.3* 23.5* 28.8*  PLT 619* 736* 837* 669*    COAGS: Recent Labs    11/01/18 1531 08/17/19 1040  INR 1.5* 1.1    BMP: Recent Labs    08/19/19 0429 08/21/19 0335 08/22/19 0350 08/23/19 0350  NA 137 140 139 139  K 4.3 4.1 4.0 3.9  CL 105 107 103 102  CO2 24 24 25 27   GLUCOSE 131* 113* 100* 102*  BUN 23* 24* 17 14  CALCIUM 8.0* 7.8* 8.0* 8.0*  CREATININE 0.65 0.69 0.69 0.77  GFRNONAA >60 >60 >60 >60  GFRAA >60 >60 >60 >60    LIVER FUNCTION TESTS: Recent Labs    08/14/19 0346 08/15/19 0301 08/18/19 0332 08/22/19 0350  BILITOT 1.0 1.0 1.0 0.7  AST 20 22 30 28   ALT 11 13 19 20   ALKPHOS 76 81 86 109  PROT 5.1* 5.3* 6.0* 6.0*  ALBUMIN 1.7* 1.7* 1.8* 1.8*    Assessment:  CT today shows resolution of  abdominal abscesses per Dr Garlon Hatchet injections reveal NO fistula to bowel Drains #2 and #3 (IR  drains) were removed Surgical drain remains in Rt abdomen Pt is to see Dr [name] 09/08/19 Pt has good understanding of plan   Signed: Lavonia Drafts, PA-C 09/06/2019, 1:51 PM   Please refer to Dr. Laurence Ferrari attestation of this note for management and plan.      Patient ID: Sandra Bonilla, Sandra Bonilla   DOB: Apr 18, 1975, 44 y.o.   MRN: [id number]

## 2019-09-07 ENCOUNTER — Ambulatory Visit: Payer: BC Managed Care – PPO | Admitting: Family Medicine

## 2019-09-09 ENCOUNTER — Other Ambulatory Visit: Payer: Self-pay | Admitting: General Surgery

## 2019-09-09 DIAGNOSIS — K5732 Diverticulitis of large intestine without perforation or abscess without bleeding: Secondary | ICD-10-CM

## 2019-09-14 ENCOUNTER — Ambulatory Visit
Admission: RE | Admit: 2019-09-14 | Discharge: 2019-09-14 | Disposition: A | Discharge status: Home / Self Care | Payer: BC Managed Care – PPO | Source: Ambulatory Visit | Attending: General Surgery | Admitting: General Surgery

## 2019-09-14 DIAGNOSIS — K5732 Diverticulitis of large intestine without perforation or abscess without bleeding: Secondary | ICD-10-CM

## 2019-09-16 ENCOUNTER — Other Ambulatory Visit: Payer: Self-pay

## 2019-09-16 ENCOUNTER — Inpatient Hospital Stay (HOSPITAL_COMMUNITY)
Admission: EM | Admit: 2019-09-16 | Discharge: 2019-09-24 | DRG: 640 | Disposition: A | Discharge status: Home Health | Payer: BC Managed Care – PPO | Attending: Family Medicine | Admitting: Family Medicine

## 2019-09-16 ENCOUNTER — Emergency Department (HOSPITAL_COMMUNITY): Payer: BC Managed Care – PPO

## 2019-09-16 DIAGNOSIS — Z8349 Family history of other endocrine, nutritional and metabolic diseases: Secondary | ICD-10-CM

## 2019-09-16 DIAGNOSIS — Z801 Family history of malignant neoplasm of trachea, bronchus and lung: Secondary | ICD-10-CM

## 2019-09-16 DIAGNOSIS — E871 Hypo-osmolality and hyponatremia: Secondary | ICD-10-CM | POA: Diagnosis present

## 2019-09-16 DIAGNOSIS — G35 Multiple sclerosis: Secondary | ICD-10-CM | POA: Diagnosis present

## 2019-09-16 DIAGNOSIS — Z932 Ileostomy status: Secondary | ICD-10-CM

## 2019-09-16 DIAGNOSIS — Z8261 Family history of arthritis: Secondary | ICD-10-CM

## 2019-09-16 DIAGNOSIS — R7401 Elevation of levels of liver transaminase levels: Secondary | ICD-10-CM

## 2019-09-16 DIAGNOSIS — E876 Hypokalemia: Secondary | ICD-10-CM | POA: Diagnosis present

## 2019-09-16 DIAGNOSIS — Z933 Colostomy status: Secondary | ICD-10-CM

## 2019-09-16 DIAGNOSIS — Z833 Family history of diabetes mellitus: Secondary | ICD-10-CM

## 2019-09-16 DIAGNOSIS — Z87891 Personal history of nicotine dependence: Secondary | ICD-10-CM

## 2019-09-16 DIAGNOSIS — R531 Weakness: Secondary | ICD-10-CM | POA: Diagnosis not present

## 2019-09-16 DIAGNOSIS — Z808 Family history of malignant neoplasm of other organs or systems: Secondary | ICD-10-CM

## 2019-09-16 DIAGNOSIS — Z79899 Other long term (current) drug therapy: Secondary | ICD-10-CM

## 2019-09-16 DIAGNOSIS — E860 Dehydration: Secondary | ICD-10-CM | POA: Diagnosis not present

## 2019-09-16 DIAGNOSIS — I10 Essential (primary) hypertension: Secondary | ICD-10-CM | POA: Diagnosis present

## 2019-09-16 DIAGNOSIS — N179 Acute kidney failure, unspecified: Secondary | ICD-10-CM | POA: Diagnosis not present

## 2019-09-16 DIAGNOSIS — Z791 Long term (current) use of non-steroidal anti-inflammatories (NSAID): Secondary | ICD-10-CM

## 2019-09-16 DIAGNOSIS — U071 COVID-19: Secondary | ICD-10-CM | POA: Diagnosis present

## 2019-09-16 DIAGNOSIS — Z9049 Acquired absence of other specified parts of digestive tract: Secondary | ICD-10-CM

## 2019-09-16 DIAGNOSIS — R627 Adult failure to thrive: Secondary | ICD-10-CM | POA: Diagnosis present

## 2019-09-16 DIAGNOSIS — E162 Hypoglycemia, unspecified: Secondary | ICD-10-CM | POA: Diagnosis present

## 2019-09-16 DIAGNOSIS — Z803 Family history of malignant neoplasm of breast: Secondary | ICD-10-CM

## 2019-09-16 DIAGNOSIS — J45909 Unspecified asthma, uncomplicated: Secondary | ICD-10-CM | POA: Diagnosis present

## 2019-09-16 DIAGNOSIS — K9189 Other postprocedural complications and disorders of digestive system: Secondary | ICD-10-CM

## 2019-09-16 DIAGNOSIS — J1282 Pneumonia due to coronavirus disease 2019: Secondary | ICD-10-CM

## 2019-09-16 DIAGNOSIS — E861 Hypovolemia: Secondary | ICD-10-CM | POA: Diagnosis present

## 2019-09-16 DIAGNOSIS — Z8249 Family history of ischemic heart disease and other diseases of the circulatory system: Secondary | ICD-10-CM

## 2019-09-16 LAB — CBC WITH DIFFERENTIAL/PLATELET
Abs Immature Granulocytes: 0.07 10*3/uL (ref 0.00–0.07)
Basophils Absolute: 0.1 10*3/uL (ref 0.0–0.1)
Basophils Relative: 1 %
Eosinophils Absolute: 0.2 10*3/uL (ref 0.0–0.5)
Eosinophils Relative: 1 %
HCT: 44.4 % (ref 36.0–46.0)
Hemoglobin: 13.6 g/dL (ref 12.0–15.0)
Immature Granulocytes: 1 %
Lymphocytes Relative: 14 %
Lymphs Abs: 1.9 10*3/uL (ref 0.7–4.0)
MCH: 25.8 pg — ABNORMAL LOW (ref 26.0–34.0)
MCHC: 30.6 g/dL (ref 30.0–36.0)
MCV: 84.3 fL (ref 80.0–100.0)
Monocytes Absolute: 1.3 10*3/uL — ABNORMAL HIGH (ref 0.1–1.0)
Monocytes Relative: 10 %
Neutro Abs: 9.8 10*3/uL — ABNORMAL HIGH (ref 1.7–7.7)
Neutrophils Relative %: 73 %
Platelets: 338 10*3/uL (ref 150–400)
RBC: 5.27 MIL/uL — ABNORMAL HIGH (ref 3.87–5.11)
RDW: 16.6 % — ABNORMAL HIGH (ref 11.5–15.5)
WBC: 13.4 10*3/uL — ABNORMAL HIGH (ref 4.0–10.5)
nRBC: 0 % (ref 0.0–0.2)

## 2019-09-16 LAB — LACTIC ACID, PLASMA: Lactic Acid, Venous: 2 mmol/L (ref 0.5–1.9)

## 2019-09-16 LAB — COMPREHENSIVE METABOLIC PANEL
ALT: 69 U/L — ABNORMAL HIGH (ref 0–44)
AST: 52 U/L — ABNORMAL HIGH (ref 15–41)
Albumin: 4 g/dL (ref 3.5–5.0)
Alkaline Phosphatase: 197 U/L — ABNORMAL HIGH (ref 38–126)
Anion gap: 17 — ABNORMAL HIGH (ref 5–15)
BUN: 18 mg/dL (ref 6–20)
CO2: 24 mmol/L (ref 22–32)
Calcium: 9.6 mg/dL (ref 8.9–10.3)
Chloride: 95 mmol/L — ABNORMAL LOW (ref 98–111)
Creatinine, Ser: 1.16 mg/dL — ABNORMAL HIGH (ref 0.44–1.00)
GFR calc Af Amer: >60 mL/min (ref >60)
GFR calc non Af Amer: 56 mL/min — ABNORMAL LOW (ref >60)
Glucose, Bld: 98 mg/dL (ref 70–99)
Potassium: 3.5 mmol/L (ref 3.5–5.1)
Sodium: 136 mmol/L (ref 135–145)
Total Bilirubin: 1.5 mg/dL — ABNORMAL HIGH (ref 0.3–1.2)
Total Protein: 8.9 g/dL — ABNORMAL HIGH (ref 6.5–8.1)

## 2019-09-16 LAB — I-STAT BETA HCG BLOOD, ED (MC, WL, AP ONLY): I-stat hCG, quantitative: 5.9 m[IU]/mL — ABNORMAL HIGH (ref <5)

## 2019-09-16 LAB — MAGNESIUM: Magnesium: 1.7 mg/dL (ref 1.7–2.4)

## 2019-09-16 LAB — LIPASE, BLOOD: Lipase: 35 U/L (ref 11–51)

## 2019-09-16 MED ORDER — SODIUM CHLORIDE 0.9 % IV BOLUS (SEPSIS)
1000.0000 mL | Freq: Once | INTRAVENOUS | Status: AC
  Administered 2019-09-16: 1000 mL via INTRAVENOUS

## 2019-09-16 MED ORDER — FOLIC ACID 1 MG PO TABS
1.0000 mg | ORAL_TABLET | Freq: Every day | ORAL | Status: DC
  Administered 2019-09-17 – 2019-09-24 (×8): 1 mg via ORAL
  Filled 2019-09-16 (×8): qty 1

## 2019-09-16 MED ORDER — PANTOPRAZOLE SODIUM 40 MG PO TBEC
40.0000 mg | DELAYED_RELEASE_TABLET | Freq: Every day | ORAL | Status: DC
  Administered 2019-09-17 – 2019-09-24 (×8): 40 mg via ORAL
  Filled 2019-09-16 (×8): qty 1

## 2019-09-16 MED ORDER — LORAZEPAM 1 MG PO TABS
1.0000 mg | ORAL_TABLET | Freq: Two times a day (BID) | ORAL | Status: DC | PRN
  Administered 2019-09-20 – 2019-09-23 (×4): 1 mg via ORAL
  Filled 2019-09-16 (×4): qty 1

## 2019-09-16 MED ORDER — MORPHINE SULFATE (PF) 2 MG/ML IV SOLN
2.0000 mg | INTRAVENOUS | Status: DC | PRN
  Administered 2019-09-17 – 2019-09-21 (×18): 2 mg via INTRAVENOUS
  Filled 2019-09-16 (×18): qty 1

## 2019-09-16 MED ORDER — CYCLOBENZAPRINE HCL 5 MG PO TABS: 5.0000 mg | ORAL_TABLET | Freq: Every evening | ORAL | Status: DC | PRN

## 2019-09-16 MED ORDER — BUDESONIDE 0.25 MG/2ML IN SUSP: 0.2500 mg | Freq: Two times a day (BID) | RESPIRATORY_TRACT | Status: DC

## 2019-09-16 MED ORDER — IOHEXOL 300 MG/ML  SOLN
100.0000 mL | Freq: Once | INTRAMUSCULAR | Status: AC | PRN
  Administered 2019-09-16: 100 mL via INTRAVENOUS

## 2019-09-16 MED ORDER — FERROUS GLUCONATE 324 (38 FE) MG PO TABS
324.0000 mg | ORAL_TABLET | Freq: Every day | ORAL | Status: DC
  Administered 2019-09-17 – 2019-09-24 (×8): 324 mg via ORAL
  Filled 2019-09-16 (×9): qty 1

## 2019-09-16 MED ORDER — VITAMIN D 25 MCG (1000 UNIT) PO TABS
1000.0000 [IU] | ORAL_TABLET | Freq: Every day | ORAL | Status: DC
  Administered 2019-09-17 – 2019-09-24 (×8): 1000 [IU] via ORAL
  Filled 2019-09-16 (×8): qty 1

## 2019-09-16 MED ORDER — GABAPENTIN 100 MG PO CAPS
100.0000 mg | ORAL_CAPSULE | Freq: Every day | ORAL | Status: DC | PRN
  Administered 2019-09-21: 100 mg via ORAL
  Filled 2019-09-16: qty 1

## 2019-09-16 MED ORDER — MORPHINE SULFATE (PF) 4 MG/ML IV SOLN
4.0000 mg | Freq: Once | INTRAVENOUS | Status: AC
  Administered 2019-09-16: 4 mg via INTRAVENOUS
  Filled 2019-09-16: qty 1

## 2019-09-16 MED ORDER — POTASSIUM CHLORIDE CRYS ER 10 MEQ PO TBCR
10.0000 meq | EXTENDED_RELEASE_TABLET | Freq: Every day | ORAL | Status: DC
  Administered 2019-09-17: 10 meq via ORAL
  Filled 2019-09-16: qty 1

## 2019-09-16 MED ORDER — CALCIUM POLYCARBOPHIL 625 MG PO TABS
625.0000 mg | ORAL_TABLET | Freq: Three times a day (TID) | ORAL | Status: DC
  Administered 2019-09-17 – 2019-09-24 (×23): 625 mg via ORAL
  Filled 2019-09-16 (×23): qty 1

## 2019-09-16 MED ORDER — LOPERAMIDE HCL 2 MG PO CAPS
2.0000 mg | ORAL_CAPSULE | Freq: Three times a day (TID) | ORAL | Status: DC
  Administered 2019-09-17 – 2019-09-19 (×8): 2 mg via ORAL
  Filled 2019-09-16 (×8): qty 1

## 2019-09-16 MED ORDER — SODIUM CHLORIDE 0.9 % IV SOLN: INTRAVENOUS | Status: DC

## 2019-09-16 MED ORDER — UMECLIDINIUM-VILANTEROL 62.5-25 MCG/INH IN AEPB
1.0000 | INHALATION_SPRAY | Freq: Every day | RESPIRATORY_TRACT | Status: DC
  Administered 2019-09-19: 1 via RESPIRATORY_TRACT
  Filled 2019-09-16: qty 14

## 2019-09-16 MED ORDER — TERIFLUNOMIDE 14 MG PO TABS
14.0000 mg | ORAL_TABLET | Freq: Every day | ORAL | Status: DC
  Administered 2019-09-20 – 2019-09-24 (×5): 14 mg via ORAL

## 2019-09-16 NOTE — H&P (Signed)
History and Physical    Sandra Bonilla:[id number] DOB: [birth year] DOA: 09/16/2019  PCP: [name], MD  Patient coming from: Home, cousin who is her care-taker is at bedside  I have personally briefly reviewed patient's old medical records in [place]  Chief Complaint: Weakness  HPI: Sandra Bonilla is a 44 y.o. female with medical history significant for multiple sclerosis and hypertension who presents with worsening weakness, decreased p.o. intake.  Pt had prolonged hospital stay due to diverticulitis with perforation and abscess back in May. Had elective sigmoidectomy due to diverticulitis with stricture. Subsequently had post-operative complications on day 7 with an anastomotic leak and had diverting ileostomy and drain placement. Also developed intra-abdominal abscesses and had 3 drained placed by IR. One was removed before discharge. Had to briefly be placed on TPN but has since been back on oral diet.  Had follow up with IR on 6/8 with repeat CT showing resolution of abdominal abscesses so drain #2 and #3 were removed. Still had surgical drain in Rt abdomen.   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    She is coming in for decrease appetite and increase weakness.  States she has not felt right ever since discharge back at the end of May.  However her weakness this week began to be even worse.  She was having shortness of breath with exertion and increased generalized pallor when doing physical therapy.  Her PT also noticed that she was more tachycardic.  Felt her barium enema procedure 2 days ago made her feel worse.  She also noted today there was some blood-tinged in her room drain and that was increased drainage output.  She has abdominal pain around her ileostomy site but no worse than usual.  Has nausea but no vomiting.  Denies any fever.  No chest pain associated with her shortness of  breath.  In the ED, she was initially tachycardic, afebrile and normotensive.  Leukocytosis of 13.4.  Lactate of 2.  Creatinine elevated at 1.16 from a prior of 0.77.  Elevated AST of 52, ALT of 69, total bilirubin of 1.6.  Elevated anion gap of 17.  She has a mildly elevated hCG but reports that she has an IUD and is not sexually active.  CT abdomen and pelvis showed mild wall thickening of previous sigmoid colonic anastomosis site.  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  ED PA spoke with on call general surgery Dr. Brantley Stage who recommended that she be admitted and they will consult in the morning. No urgent surgical interventions at this time.   Review of Systems: Constitutional: No Weight Change, No Fever ENT/Mouth: No sore throat, No Rhinorrhea Eyes: No Eye Pain, No Vision Changes Cardiovascular: No Chest Pain, + SOB Respiratory: No Cough, No Sputum, No Wheezing, no Dyspnea  Gastrointestinal: + Nausea, No Vomiting, No Diarrhea, No Constipation, + Pain Genitourinary: no Urinary Incontinence Musculoskeletal: No Arthralgias, No Myalgias Skin: No Skin Lesions, No Pruritus, Neuro: + Weakness, No Numbness Psych: No Anxiety/Panic, No Depression, + decrease appetite Heme/Lymph: No Bruising, No Bleeding  Past Medical History:  Diagnosis Date  . Anemia 05/2019   iron infusions  . Anxiety   . Asthma   . Cervical high risk human papillomavirus (HPV) DNA test positive 09/2015  . Cholecystitis 2006   GALBLADDER REMOVED  . Depression   .  Diverticulitis   . Diverticulosis   . 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 Head injury 06/25/2016  . History of mammogram 11/2014   FIBROADENOMA  . History of Papanicolaou smear of cervix 01/24/13; 10/15/15   -/-; -/+  . Hypertension   . Menorrhagia 02/01/2010   D/C HYSTEROSCOPY ENDO POLYP  . MS (multiple sclerosis) ([place])   . Ovarian cyst 9/15; 2015-2016    LEFT - WENT TO [facility]; RIGHT  . 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 BIOPSY  03/21/2019   Procedure: BIOPSY;  Surgeon: Rush Landmark Telford Nab., MD;  Location: [facility];  Service: Gastroenterology;;  . BREAST BIOPSY Right 2016   Fibroadenoma  . CHOLECYSTECTOMY  2006  . COLON RESECTION N/A 08/11/2019   Procedure: DIAGNOSTIC LAPAROSCOPY WITH [place] OUT, DRAIN PLACEMENT, AND LOOP ILEOSTOMY CREATION;  Surgeon: [name], MD;  Location: [facility];  Service: General;  Laterality: N/A;  . COLONOSCOPY WITH PROPOFOL N/A 03/21/2019   Procedure: COLONOSCOPY WITH PROPOFOL;  Surgeon: [name], MD;  Location: [facility];  Service: Gastroenterology;  Laterality: N/A;  ultraslim colonoscope   . CYSTOSCOPY WITH INJECTION N/A 08/04/2019   Procedure: FIREFLY INJECTION;  Surgeon: Ardis Hughs, MD;  Location: [facility];  Service: Urology;  Laterality: N/A;  . DILATION AND CURETTAGE, DIAGNOSTIC / THERAPEUTIC  02/01/2010   D/C HYSTEROSCOPY ENDO POLYP; [facility]  . ENDOSCOPIC MUCOSAL RESECTION  03/21/2019   Procedure: ENDOSCOPIC MUCOSAL RESECTION;  Surgeon: Rush Landmark Telford Nab., MD;  Location: [facility];  Service: Gastroenterology;;  . ESOPHAGOGASTRODUODENOSCOPY (EGD) WITH PROPOFOL N/A 03/21/2019   Procedure: ESOPHAGOGASTRODUODENOSCOPY (EGD) WITH PROPOFOL;  Surgeon: [name], MD;  Location: [facility];  Service: Gastroenterology;  Laterality: N/A;  . EYE SURGERY     Lasik  . FLEXIBLE SIGMOIDOSCOPY  07/31/2016   Procedure: FLEXIBLE SIGMOIDOSCOPY;  Surgeon: [name], MD;  Location: [facility];  Service: Endoscopy;;  . HEMOSTASIS CLIP PLACEMENT  03/21/2019   Procedure: HEMOSTASIS CLIP PLACEMENT;  Surgeon: [name], MD;  Location: [facility];  Service: Gastroenterology;;  . HYSTEROSCOPY  02/01/2010   ENDO POLYP - [facility]  . IR RADIOLOGIST EVAL & MGMT  11/16/2018  . IR RADIOLOGIST EVAL & MGMT   09/06/2019  . SCLEROTHERAPY  03/21/2019   Procedure: Clide Deutscher;  Surgeon: Mansouraty, Telford Nab., MD;  Location: [facility];  Service: Gastroenterology;;     reports that she quit smoking about 11 years ago. She quit after 10.00 years of use. She has never used smokeless tobacco. She reports that she does not drink alcohol and does not use drugs.  Allergies  Allergen Reactions  . Sulfamethoxazole-Trimethoprim Rash and Hives  . Sulfa Antibiotics Other (See Comments)    Family History  Problem Relation Age of Onset  . Breast cancer Maternal Aunt 50       has contact  . Rheum arthritis Mother   . Hypertension Mother   . Thyroid disease Mother        HYPOTHYROIDISM  . Lung cancer Mother 85  . Heart disease Father   . Diabetes Father   . Hypertension Father   . Cancer Paternal Grandmother        MELANOMA OF SKIN  . Cancer Cousin        KIDNEY-COUSIN  . Colon cancer Neg Hx   . Stomach cancer Neg Hx   . Pancreatic cancer Neg Hx   . Esophageal cancer Neg Hx   . Inflammatory bowel  disease Neg Hx   . Rectal cancer Neg Hx      Prior to Admission medications   Medication Sig Start Date End Date Taking? Authorizing Provider  acetaminophen (TYLENOL) 325 MG tablet Take 2 tablets (650 mg total) by mouth every 6 (six) hours as needed for mild pain (or Fever >/= 101). 11/05/18  Yes Samuella Cota, MD  AUBAGIO 14 MG TABS TAKE 1 TABLET BY MOUTH  DAILY Patient taking differently: Take 14 mg by mouth daily.  02/28/19  Yes Sater, Nanine Means, MD  chlorthalidone (HYGROTON) 25 MG tablet Take 12.5 mg by mouth daily.   Yes [provider]  cholecalciferol (VITAMIN D3) 25 MCG (1000 UT) tablet Take 1,000 Units by mouth daily.   Yes [provider]  cyclobenzaprine (FLEXERIL) 5 MG tablet Take 1 tablet (5 mg total) by mouth at bedtime as needed. Patient taking differently: Take 5 mg by mouth at bedtime as needed for muscle spasms.  12/26/16  Yes Sater, Nanine Means, MD  ferrous  gluconate (FERGON) 324 MG tablet TAKE 1 TABLET(324 MG) BY MOUTH DAILY WITH BREAKFAST Patient taking differently: Take 324 mg by mouth daily with breakfast.  05/18/19  Yes Mansouraty, Telford Nab., MD  folic acid (FOLVITE) 1 MG tablet TAKE 1 TABLET(1 MG) BY MOUTH DAILY Patient taking differently: Take 1 mg by mouth daily.  07/04/19  Yes Mansouraty, Telford Nab., MD  gabapentin (NEURONTIN) 100 MG capsule Take 100 mg by mouth daily as needed (nerve pain).  05/19/18  Yes [provider]  levonorgestrel (MIRENA) 20 MCG/24HR IUD 1 each by Intrauterine route once. 11/14/15  Yes [provider]  loperamide (IMODIUM) 2 MG capsule Take 1 capsule (2 mg total) by mouth 3 (three) times daily before meals. 2/84/13  Yes [name], MD  LORazepam (ATIVAN) 1 MG tablet Take 1 mg by mouth 2 (two) times daily as needed for anxiety.  12/15/18  Yes [provider]  meloxicam (MOBIC) 15 MG tablet Take 1 tablet (15 mg total) by mouth daily. Patient taking differently: Take 15 mg by mouth daily as needed for pain.  12/26/16  Yes Sater, Nanine Means, MD  omeprazole (PRILOSEC) 40 MG capsule TAKE 1 CAPSULE(40 MG) BY MOUTH DAILY BEFORE BREAKFAST Patient taking differently: Take 40 mg by mouth daily.  08/03/19  Yes Mansouraty, Telford Nab., MD  ondansetron (ZOFRAN-ODT) 4 MG disintegrating tablet Take 4 mg by mouth every 6 (six) hours as needed for nausea or vomiting.  09/14/19  Yes [provider]  oxybutynin (DITROPAN) 5 MG tablet TAKE 1 TABLET(5 MG) BY MOUTH TWICE DAILY Patient taking differently: Take 5 mg by mouth 2 (two) times daily.  04/13/19  Yes Sater, Nanine Means, MD  oxyCODONE (OXY IR/ROXICODONE) 5 MG immediate release tablet Take 1-2 tablets (5-10 mg total) by mouth every 6 (six) hours as needed for moderate pain or severe pain. 2/44/01  Yes [name], MD  polycarbophil (FIBERCON) 625 MG tablet Take 1 tablet (625 mg total) by mouth 3 (three) times daily. 0/27/25  Yes [name], MD   Polyvinyl Alcohol-Povidone (REFRESH OP) Place 2 drops into both eyes 4 (four) times daily as needed (dryness).   Yes [provider]  potassium chloride (MICRO-K) 10 MEQ CR capsule Take 10 mEq by mouth every morning. 06/29/19  Yes [provider]  Probiotic Product (PROBIOTIC PO) Take 1 capsule by mouth daily.   Yes [provider]  TRELEGY ELLIPTA 100-62.5-25 MCG/INH AEPB Inhale 1 puff into the lungs daily. 02/10/19  Yes [provider]    Physical Exam: Vitals:   09/16/19 1912 09/16/19 1913 09/16/19 2155 09/16/19 2200  BP:   107/62 101/61  Pulse: 96 94 99 96  Resp:      Temp:      TempSrc:      SpO2: 100% 100% 98% 100%    Constitutional: NAD, calm, comfortable, nontoxic appearing female laying at 30 degree incline in bed Vitals:   09/16/19 1912 09/16/19 1913 09/16/19 2155 09/16/19 2200  BP:   107/62 101/61  Pulse: 96 94 99 96  Resp:      Temp:      TempSrc:      SpO2: 100% 100% 98% 100%   Eyes: PERRL, lids and conjunctivae normal ENMT: Mucous membranes are moist.  Neck: normal, supple, Respiratory: clear to auscultation bilaterally, no wheezing, no crackles. Normal respiratory effort.  Cardiovascular: Regular rate and rhythm, no murmurs / rubs / gallops. No extremity edema. .  Abdomen: Mild tenderness around ileostomy and right lower quadrant drainage site, ileostomy bag and drain in place with moderate amount liquid stool without any blood.  No masses palpated.  Bowel sounds positive.  Musculoskeletal: no clubbing / cyanosis. No joint deformity upper and lower extremities. Good ROM, no contractures. Normal muscle tone.  Skin: no rashes, lesions, ulcers. No induration Neurologic: CN 2-12 grossly intact. Sensation intact, Strength 5/5 in all 4.  Psychiatric: Normal judgment and insight. Alert and oriented x 3. Normal mood.     Labs on Admission: I have personally reviewed following labs and imaging studies  CBC: Recent Labs  Lab  09/16/19 1749  WBC 13.4*  NEUTROABS 9.8*  HGB 13.6  HCT 44.4  MCV 84.3  PLT 573   Basic Metabolic Panel: Recent Labs  Lab 09/16/19 1749  NA 136  K 3.5  CL 95*  CO2 24  GLUCOSE 98  BUN 18  CREATININE 1.16*  CALCIUM 9.6  MG 1.7   GFR: CrCl cannot be calculated (Unknown ideal weight.). Liver Function Tests: Recent Labs  Lab 09/16/19 1749  AST 52*  ALT 69*  ALKPHOS 197*  BILITOT 1.5*  PROT 8.9*  ALBUMIN 4.0   Recent Labs  Lab 09/16/19 1749  LIPASE 35   No results for input(s): AMMONIA in the last 168 hours. Coagulation Profile: No results for input(s): INR, PROTIME in the last 168 hours. Cardiac Enzymes: No results for input(s): CKTOTAL, CKMB, CKMBINDEX, TROPONINI in the last 168 hours. BNP (last 3 results) No results for input(s): PROBNP in the last 8760 hours. HbA1C: No results for input(s): HGBA1C in the last 72 hours. CBG: No results for input(s): GLUCAP in the last 168 hours. Lipid Profile: No results for input(s): CHOL, HDL, LDLCALC, TRIG, CHOLHDL, LDLDIRECT in the last 72 hours. Thyroid Function Tests: No results for input(s): TSH, T4TOTAL, FREET4, T3FREE, THYROIDAB in the last 72 hours. Anemia Panel: No results for input(s): VITAMINB12, FOLATE, FERRITIN, TIBC, IRON, RETICCTPCT in the last 72 hours. Urine analysis:    Component Value Date/Time   COLORURINE AMBER (A) 11/21/2018 1729   APPEARANCEUR HAZY (A) 11/21/2018 1729   LABSPEC 1.026 11/21/2018 1729   PHURINE 5.0 11/21/2018 1729   GLUCOSEU NEGATIVE 11/21/2018 1729   HGBUR LARGE (A) 11/21/2018 1729   BILIRUBINUR NEGATIVE 11/21/2018 1729   KETONESUR NEGATIVE 11/21/2018 1729   PROTEINUR 30 (A) 11/21/2018 1729   UROBILINOGEN 0.2 12/12/2013 2356   NITRITE NEGATIVE 11/21/2018 1729   LEUKOCYTESUR NEGATIVE 11/21/2018 1729    Radiological Exams on Admission: CT  ABDOMEN PELVIS W CONTRAST  Result Date: 09/16/2019 CLINICAL DATA:  Abdominal distension recent surgery pain EXAM: CT ABDOMEN AND  PELVIS WITH CONTRAST TECHNIQUE: Multidetector CT imaging of the abdomen and pelvis was performed using the standard protocol following bolus administration of intravenous contrast. CONTRAST:  143mL OMNIPAQUE IOHEXOL 300 MG/ML  SOLN COMPARISON:  September 06, 2018 FINDINGS: Lower chest: The visualized heart size within normal limits. No pericardial fluid/thickening. No hiatal hernia. Streaky atelectasis seen at the right base Hepatobiliary: The liver is normal in density without focal abnormality.The main portal vein is patent. The patient is status post cholecystectomy. No biliary ductal dilation. Pancreas: Unremarkable. No pancreatic ductal dilatation or surrounding inflammatory changes. Spleen: Normal in size without focal abnormality. Adrenals/Urinary Tract: Both adrenal glands appear normal. The kidneys and collecting system appear normal without evidence of urinary tract calculus or hydronephrosis. Bladder is unremarkable. Stomach/Bowel: The stomach is normal appearance. Again noted is a right lower quadrant diverting. No dilatation is seen. Prior sigmoidectomy is noted with a colonic anastomosis. There appears to be mild wall thickening the anastomotic site on prior exam. Again noted is a right-sided pigtail catheter extending into the deep pelvis within a collection containing contrast measuring approximately 2.5 x 3.1 cm and the collection with a small amount of contrast from the collection into a small bowel loop anteriorly, series 3, image 76. Vascular/Lymphatic: There are no enlarged mesenteric, retroperitoneal, or pelvic lymph nodes. No significant vascular findings are present. Reproductive:  The uterus and adnexa are unremarkable. Other: As described above Musculoskeletal: No acute or significant osseous findings. IMPRESSION: 1. Postsurgical changes with a diverting ileostomy and a sigmoid colonic anastomosis. There appears to be mild wall thickening with adjacent to the anastomotic site as on the prior  exam. 2. Pigtail catheter within a deep pelvic collection which contains contrast and a small amount of contrast extending into the adjacent small bowel loops, consistent with the coloenteric fistula as on recent fluoroscopic guided exam on September 14, 2019 Electronically Signed   By: Prudencio Pair M.D.   On: 09/16/2019 22:22      Assessment/Plan  Weakness Likely secondary to decreased p.o. intake from recent complicated abdominal surgery and dehydration Continue to monitor fluids General surgery is aware of patient and will consult in the morning.  No acute changes seen in CT abdomen and pelvis  History of diverticulitis with sigmoid colectomy and ileostomy w/ subsequent drain placement for abscesses Wound/ostomy consult  AKI Continue IV fluids.  Avoid nephrotoxic agent.  Transaminitis  Unclear etiology No findings on liver on CT abdomen Follow with morning CMP   Hypertension Hold chlorothiazide due to AKI  Multiple sclerosis continue Teriflunomide continue PRN flexeril and gabapentin  DVT prophylaxis:.SCDs Code Status: Full Family Communication: Plan discussed with patient at bedside  disposition Plan: Home with observation Consults called:  Admission status: Observation  Status is: Observation  The patient remains OBS appropriate and will d/c before 2 midnights.  Dispo: The patient is from: Home              Anticipated d/c is to: Home              Anticipated d/c date is: 1 day              Patient currently is not medically stable to d/c.         Orene Desanctis DO [facility]   If 7PM-7AM, please contact night-coverage [email or web address]   09/16/2019, 11:37 PM

## 2019-09-16 NOTE — ED Provider Notes (Signed)
[facility] DEPT Provider Note   CSN: [id number] Arrival date & time: 09/16/19  1423     History Chief Complaint  Patient presents with   Weakness    Sandra Bonilla is a 44 y.o. female.  Patient is a 44 y/o F with hx of MS and recent long hospitalization from May 6-May 28th 2021 for sigmoidectomy due to severe diverticulitis with stricture complicated by multiple abscesses and anastomotic leak presenting to the ER for generalized weakness, fatigue and poor appetite since d/c form the hospital. Also reports noticing blood in her ostomy bag while in the waiting room today. She reports poor PO input due to decreased appetite and "nothing tastes good". Since d/c. Mild nausea without vomiting. Mild abdominal pain unchanged since her d/c. Denies fever, dysuria, cough. She reports fatigue with SOB and lightheadedness when just walking to the bathroom from home and needing to use a walker due to her fatigue. Previously was ambulatory without assistance. She reports alling her surgeon office today who advised ED for evaluation.         Past Medical History:  Diagnosis Date   Anemia 05/2019   iron infusions   Anxiety    Asthma    Cervical high risk human papillomavirus (HPV) DNA test positive 09/2015   Cholecystitis 2006   GALBLADDER REMOVED   Depression    Diverticulitis    Diverticulosis    Fibroadenoma 2016   RIGHT BREAST   Head injury 06/25/2016   History of mammogram 11/2014   FIBROADENOMA   History of Papanicolaou smear of cervix 01/24/13; 10/15/15   -/-; -/+   Hypertension    Menorrhagia 02/01/2010   D/C HYSTEROSCOPY ENDO POLYP   MS (multiple sclerosis) ([place])    Ovarian cyst 9/15; 2015-2016   LEFT - WENT TO [facility]; RIGHT   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    Patient Active Problem List   Diagnosis Date Noted   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   Postoperative anemia due to acute blood loss 08/20/2019     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   Anemia of chronic disease 06/25/2019   Change in bowel habits 06/25/2019   IUD threads lost 01/26/2019   History of abnormal cervical Pap smear 01/25/2019   Diverticulitis of large intestine with perforation and abscess 12/30/2018   Hx of diverticulitis of colon 12/24/2018   Dyspepsia 12/24/2018   Non-intractable vomiting 12/24/2018   Abnormal CT scan, pelvis 12/24/2018   Urinary dysfunction 12/30/2017   Varicose veins of bilateral lower extremities with pain 10/28/2017   Low grade squamous intraepithelial lesion (LGSIL) on cervical Pap smear 10/29/2016   Cervical high risk human papillomavirus (HPV) DNA test positive 10/15/2016   Essential hypertension 04/22/2016   Other headache syndrome 02/05/2016   Vitamin D deficiency 10/09/2015   Other fatigue 10/09/2015   Dysesthesia 04/11/2015   Depression with anxiety 04/11/2015   Gait disturbance 12/13/2014   Insomnia 12/13/2014   Multiple sclerosis ([place]) 08/30/2012    Past Surgical History:  Procedure Laterality Date   BIOPSY  03/21/2019   Procedure: BIOPSY;  Surgeon: [name], MD;  Location: [facility];  Service: Gastroenterology;;   BREAST BIOPSY Right 2016   Fibroadenoma   CHOLECYSTECTOMY  2006   COLON RESECTION N/A 08/11/2019   Procedure: DIAGNOSTIC LAPAROSCOPY WITH [place] OUT, DRAIN PLACEMENT, AND LOOP ILEOSTOMY CREATION;  Surgeon: [name], MD;  Location: [facility];  Service: General;  Laterality: N/A;   COLONOSCOPY WITH PROPOFOL N/A 03/21/2019  Procedure: COLONOSCOPY WITH PROPOFOL;  Surgeon: Mansouraty, Telford Nab., MD;  Location: [facility];  Service: Gastroenterology;  Laterality: N/A;  ultraslim colonoscope    CYSTOSCOPY WITH INJECTION N/A 08/04/2019   Procedure: FIREFLY INJECTION;  Surgeon: Ardis Hughs, MD;  Location: [facility];  Service: Urology;  Laterality: N/A;   DILATION AND CURETTAGE, DIAGNOSTIC / THERAPEUTIC   02/01/2010   D/C HYSTEROSCOPY ENDO POLYP; [facility]   ENDOSCOPIC MUCOSAL RESECTION  03/21/2019   Procedure: ENDOSCOPIC MUCOSAL RESECTION;  Surgeon: Rush Landmark Telford Nab., MD;  Location: [facility];  Service: Gastroenterology;;   ESOPHAGOGASTRODUODENOSCOPY (EGD) WITH PROPOFOL N/A 03/21/2019   Procedure: ESOPHAGOGASTRODUODENOSCOPY (EGD) WITH PROPOFOL;  Surgeon: [name], MD;  Location: [facility];  Service: Gastroenterology;  Laterality: N/A;   EYE SURGERY     Lasik   FLEXIBLE SIGMOIDOSCOPY  07/31/2016   Procedure: FLEXIBLE SIGMOIDOSCOPY;  Surgeon: [name], MD;  Location: [facility];  Service: Endoscopy;;   HEMOSTASIS CLIP PLACEMENT  03/21/2019   Procedure: HEMOSTASIS CLIP PLACEMENT;  Surgeon: [name], MD;  Location: [facility];  Service: Gastroenterology;;   HYSTEROSCOPY  02/01/2010   ENDO POLYP - [facility]   IR RADIOLOGIST EVAL & MGMT  11/16/2018   IR RADIOLOGIST EVAL & MGMT  09/06/2019   SCLEROTHERAPY  03/21/2019   Procedure: Clide Deutscher;  Surgeon: Mansouraty, Telford Nab., MD;  Location[facility];  Service: Gastroenterology;;     OB History    Gravida  0   Para  0   Term  0   Preterm  0   AB  0   Living  0     SAB  0   TAB  0   Ectopic  0   Multiple  0   Live Births  0           Family History  Problem Relation Age of Onset   Breast cancer Maternal Aunt 1       has contact   Rheum arthritis Mother    Hypertension Mother    Thyroid disease Mother        HYPOTHYROIDISM   Lung cancer Mother 23   Heart disease Father    Diabetes Father    Hypertension Father    Cancer Paternal Grandmother        MELANOMA OF SKIN   Cancer Cousin        KIDNEY-COUSIN   Colon cancer Neg Hx    Stomach cancer Neg Hx    Pancreatic cancer Neg Hx    Esophageal cancer Neg Hx    Inflammatory bowel disease Neg Hx    Rectal cancer Neg Hx     Social History   Tobacco Use   Smoking status: Former Smoker     Years: 10.00    Quit date: 2010    Years since quitting: 11.4   Smokeless tobacco: Never Used  Scientific laboratory technician Use: Never used  Substance Use Topics   Alcohol use: No   Drug use: No    Home Medications Prior to Admission medications   Medication Sig Start Date End Date Taking? Authorizing Provider  acetaminophen (TYLENOL) 325 MG tablet Take 2 tablets (650 mg total) by mouth every 6 (six) hours as needed for mild pain (or Fever >/= 101). 11/05/18   Samuella Cota, MD  AUBAGIO 14 MG TABS TAKE 1 TABLET BY MOUTH  DAILY Patient taking differently: Take 14 mg by mouth daily.  02/28/19   Sater, Nanine Means, MD  chlorthalidone (HYGROTON) 25  MG tablet Take 12.5 mg by mouth daily.    [provider]  cholecalciferol (VITAMIN D3) 25 MCG (1000 UT) tablet Take 1,000 Units by mouth daily.    [provider]  cyclobenzaprine (FLEXERIL) 5 MG tablet Take 1 tablet (5 mg total) by mouth at bedtime as needed. Patient taking differently: Take 5 mg by mouth at bedtime as needed for muscle spasms.  12/26/16   Sater, Nanine Means, MD  ferrous gluconate (FERGON) 324 MG tablet TAKE 1 TABLET(324 MG) BY MOUTH DAILY WITH BREAKFAST Patient taking differently: Take 324 mg by mouth daily with breakfast.  05/18/19   Mansouraty, Telford Nab., MD  folic acid (FOLVITE) 1 MG tablet TAKE 1 TABLET(1 MG) BY MOUTH DAILY Patient taking differently: Take 1 mg by mouth daily.  07/04/19   Mansouraty, Telford Nab., MD  gabapentin (NEURONTIN) 100 MG capsule Take 100 mg by mouth daily as needed (nerve pain).  05/19/18   [provider]  levonorgestrel (MIRENA) 20 MCG/24HR IUD 1 each by Intrauterine route once. 11/14/15   [provider]  loperamide (IMODIUM) 2 MG capsule Take 1 capsule (2 mg total) by mouth 3 (three) times daily before meals. 9/50/93   [name], MD  LORazepam (ATIVAN) 1 MG tablet Take 1 mg by mouth 2 (two) times daily as needed for anxiety.  12/15/18   [provider]    meloxicam (MOBIC) 15 MG tablet Take 1 tablet (15 mg total) by mouth daily. Patient taking differently: Take 15 mg by mouth daily as needed for pain.  12/26/16   Sater, Nanine Means, MD  omeprazole (PRILOSEC) 40 MG capsule TAKE 1 CAPSULE(40 MG) BY MOUTH DAILY BEFORE BREAKFAST 08/03/19   Mansouraty, Telford Nab., MD  oxybutynin (DITROPAN) 5 MG tablet TAKE 1 TABLET(5 MG) BY MOUTH TWICE DAILY Patient taking differently: Take 5 mg by mouth 2 (two) times daily.  04/13/19   Sater, Nanine Means, MD  oxyCODONE (OXY IR/ROXICODONE) 5 MG immediate release tablet Take 1-2 tablets (5-10 mg total) by mouth every 6 (six) hours as needed for moderate pain or severe pain. 2/67/12   [name], MD  polycarbophil (FIBERCON) 625 MG tablet Take 1 tablet (625 mg total) by mouth 3 (three) times daily. 4/58/09   [name], MD  Polyvinyl Alcohol-Povidone (REFRESH OP) Place 2 drops into both eyes 4 (four) times daily as needed (dryness).    [provider]  potassium chloride (MICRO-K) 10 MEQ CR capsule Take 10 mEq by mouth every morning. 06/29/19   [provider]  Probiotic Product (PROBIOTIC PO) Take 1 capsule by mouth daily.    [provider]  TRELEGY ELLIPTA 100-62.5-25 MCG/INH AEPB Inhale 1 puff into the lungs daily. 02/10/19   [provider]    Allergies    Sulfamethoxazole-trimethoprim and Sulfa antibiotics  Review of Systems   Review of Systems  Constitutional: Positive for appetite change and fatigue. Negative for diaphoresis and fever.  HENT: Negative for congestion and sore throat.   Respiratory: Positive for shortness of breath. Negative for cough.   Gastrointestinal: Positive for abdominal pain and blood in stool. Negative for diarrhea, nausea and vomiting.  Genitourinary: Negative for dysuria.  Musculoskeletal: Negative for back pain.  Skin: Negative for rash.  Neurological: Positive for dizziness, weakness (generalized) and light-headedness. Negative for seizures  and numbness.    Physical Exam Updated Vital Signs BP 90/77    Pulse (!) 129    Temp 97.6 F (36.4 C) (Oral)    Resp 18  SpO2 97%   Physical Exam Vitals and nursing note reviewed.  Constitutional:      General: She is not in acute distress.    Appearance: Normal appearance. She is not ill-appearing, toxic-appearing or diaphoretic.  HENT:     Head: Normocephalic and atraumatic. No raccoon eyes, Battle's sign, abrasion, contusion, masses or laceration.     Jaw: There is normal jaw occlusion.     Nose: Nose normal.     Mouth/Throat:     Mouth: Mucous membranes are moist.  Eyes:     Conjunctiva/sclera: Conjunctivae normal.  Neck:     Trachea: Trachea normal.  Cardiovascular:     Rate and Rhythm: Normal rate and regular rhythm.  Pulmonary:     Effort: Pulmonary effort is normal.     Breath sounds: Normal breath sounds.  Abdominal:     General: Abdomen is flat.     Tenderness: There is no abdominal tenderness.    Musculoskeletal:     Cervical back: Full passive range of motion without pain. No edema. No pain with movement, spinous process tenderness or muscular tenderness.     Thoracic back: Normal.     Lumbar back: Normal.  Skin:    General: Skin is warm and dry.  Neurological:     General: No focal deficit present.     Mental Status: She is alert and oriented to person, place, and time.  Psychiatric:        Mood and Affect: Mood normal.     ED Results / Procedures / Treatments   Labs (all labs ordered are listed, but only abnormal results are displayed) Labs Reviewed  CBC WITH DIFFERENTIAL/PLATELET  COMPREHENSIVE METABOLIC PANEL  LIPASE, BLOOD  URINALYSIS, ROUTINE W REFLEX MICROSCOPIC  MAGNESIUM  LACTIC ACID, PLASMA  LACTIC ACID, PLASMA    EKG None  Radiology No results found.  Procedures Procedures (including critical care time)  Medications Ordered in ED Medications  sodium chloride 0.9 % bolus 1,000 mL (has no administration in time range)     ED Course  I have reviewed the triage vital signs and the nursing notes.  Pertinent labs & imaging results that were available during my care of the patient were reviewed by me and considered in my medical decision making (see chart for details).  Clinical Course as of Sep 15 2317  Fri Sep 16, 2019  2219 Patient with complicated recent surgical histroy stemming from perforated diverticulitis with multiple abscesses, sigmoidectomy and ostomy bag in place here for generalized weakness and abdominal pain. Afebrile but tachycardic to 120s initially on exam. Mildly elevated WBC count and LFTS, alk phos and bilirubin and LA of 2.0, Hemodynamically stable. Given pain meds and fluids and awaiting CT    [KM]  2243  Spoke with surgery, Dr. Brantley Stage concerning this patient. Does not appear to be an acute surgical problem at this moment on CT. However, given her decline and abnormal labs will plan to admit to hospitalist and surgery will consult.    [KM]    Clinical Course User Index [KM] Kristine Royal   MDM Rules/Calculators/A&P                          Based on review of vitals, medical screening exam, lab work and/or imaging, there does not appear to be an acute, emergent etiology for the patient's symptoms. Counseled pt on good return precautions and encouraged both PCP and ED follow-up as needed.  Prior to discharge, I also discussed incidental imaging findings with patient in detail and advised appropriate, recommended follow-up in detail.  Clinical Impression: 1. Weakness   2. Other postoperative complication involving digestive system     Disposition: Discharge  Prior to providing a prescription for a controlled substance, I independently reviewed the patient's recent prescription history on the [facility]. The patient had no recent or regular prescriptions and was deemed appropriate for a brief, less than 3 day prescription of narcotic  for acute analgesia.  This note was prepared with assistance of Systems analyst. Occasional wrong-word or sound-a-like substitutions may have occurred due to the inherent limitations of voice recognition software.  Final Clinical Impression(s) / ED Diagnoses Final diagnoses:  None    Rx / DC Orders ED Discharge Orders    None       Kristine Royal 09/16/19 2319    Hayden Rasmussen, MD 09/17/19 1118

## 2019-09-16 NOTE — ED Triage Notes (Signed)
Patient has not been able to eat or drink in the last few weeks other than a few bites here and there. Recent seen for abdominal complications and had ileostomy bag placed on 5/13. Patient reports weakness and fatigue.

## 2019-09-16 NOTE — Progress Notes (Signed)
Called by ED PA for patient being admitted to medical service for failure to thrive. 5 weeks out from robotic assisted sigmoid colectomy by Dr. [name] complicated by a leak and laparoscopy 1 week later with diverting loop ileostomy and multiple pelvic drains. She was discharged home on 08/29/2019. She was followed up in the office this week. 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 Her creatinine is elevated indicative of ileostomy output which may not be replaced appropriately. No other complicating feature on CT scan. We were asked to see patient in a.m. We will follow while in hospital for now. Recommend IV fluids overnight and rechecking CBC in a.m. I do not see any undrained fluid collections or signs of infection.

## 2019-09-16 NOTE — ED Notes (Signed)
Date and time results received: 09/16/19 1930 (use smartphrase ".now" to insert current time)  Test: Lactic Acid Critical Value: 2.0 Name of Provider Notified:  Orders Received? Or Actions Taken?:

## 2019-09-17 ENCOUNTER — Encounter (HOSPITAL_COMMUNITY): Payer: Self-pay | Admitting: Family Medicine

## 2019-09-17 ENCOUNTER — Other Ambulatory Visit: Payer: Self-pay

## 2019-09-17 ENCOUNTER — Observation Stay (HOSPITAL_COMMUNITY): Payer: BC Managed Care – PPO

## 2019-09-17 DIAGNOSIS — E871 Hypo-osmolality and hyponatremia: Secondary | ICD-10-CM | POA: Diagnosis present

## 2019-09-17 DIAGNOSIS — Z791 Long term (current) use of non-steroidal anti-inflammatories (NSAID): Secondary | ICD-10-CM | POA: Diagnosis not present

## 2019-09-17 DIAGNOSIS — R627 Adult failure to thrive: Secondary | ICD-10-CM | POA: Diagnosis present

## 2019-09-17 DIAGNOSIS — J45909 Unspecified asthma, uncomplicated: Secondary | ICD-10-CM | POA: Diagnosis present

## 2019-09-17 DIAGNOSIS — E162 Hypoglycemia, unspecified: Secondary | ICD-10-CM | POA: Diagnosis present

## 2019-09-17 DIAGNOSIS — E860 Dehydration: Secondary | ICD-10-CM | POA: Diagnosis present

## 2019-09-17 DIAGNOSIS — I10 Essential (primary) hypertension: Secondary | ICD-10-CM | POA: Diagnosis present

## 2019-09-17 DIAGNOSIS — U071 COVID-19: Secondary | ICD-10-CM | POA: Diagnosis present

## 2019-09-17 DIAGNOSIS — Z932 Ileostomy status: Secondary | ICD-10-CM | POA: Diagnosis not present

## 2019-09-17 DIAGNOSIS — E876 Hypokalemia: Secondary | ICD-10-CM | POA: Diagnosis present

## 2019-09-17 DIAGNOSIS — Z9049 Acquired absence of other specified parts of digestive tract: Secondary | ICD-10-CM | POA: Diagnosis not present

## 2019-09-17 DIAGNOSIS — R531 Weakness: Secondary | ICD-10-CM | POA: Diagnosis present

## 2019-09-17 DIAGNOSIS — R7401 Elevation of levels of liver transaminase levels: Secondary | ICD-10-CM | POA: Diagnosis present

## 2019-09-17 DIAGNOSIS — E861 Hypovolemia: Secondary | ICD-10-CM | POA: Diagnosis present

## 2019-09-17 DIAGNOSIS — Z801 Family history of malignant neoplasm of trachea, bronchus and lung: Secondary | ICD-10-CM | POA: Diagnosis not present

## 2019-09-17 DIAGNOSIS — Z808 Family history of malignant neoplasm of other organs or systems: Secondary | ICD-10-CM | POA: Diagnosis not present

## 2019-09-17 DIAGNOSIS — Z8261 Family history of arthritis: Secondary | ICD-10-CM | POA: Diagnosis not present

## 2019-09-17 DIAGNOSIS — G35 Multiple sclerosis: Secondary | ICD-10-CM | POA: Diagnosis present

## 2019-09-17 DIAGNOSIS — Z8249 Family history of ischemic heart disease and other diseases of the circulatory system: Secondary | ICD-10-CM | POA: Diagnosis not present

## 2019-09-17 DIAGNOSIS — Z933 Colostomy status: Secondary | ICD-10-CM | POA: Diagnosis not present

## 2019-09-17 DIAGNOSIS — Z833 Family history of diabetes mellitus: Secondary | ICD-10-CM | POA: Diagnosis not present

## 2019-09-17 DIAGNOSIS — Z8349 Family history of other endocrine, nutritional and metabolic diseases: Secondary | ICD-10-CM | POA: Diagnosis not present

## 2019-09-17 DIAGNOSIS — N179 Acute kidney failure, unspecified: Secondary | ICD-10-CM | POA: Diagnosis present

## 2019-09-17 DIAGNOSIS — Z803 Family history of malignant neoplasm of breast: Secondary | ICD-10-CM | POA: Diagnosis not present

## 2019-09-17 LAB — URINALYSIS, ROUTINE W REFLEX MICROSCOPIC
Bilirubin Urine: NEGATIVE
Glucose, UA: NEGATIVE mg/dL
Hgb urine dipstick: NEGATIVE
Ketones, ur: NEGATIVE mg/dL
Nitrite: NEGATIVE
Protein, ur: NEGATIVE mg/dL
Specific Gravity, Urine: 1.005 (ref 1.005–1.030)
pH: 5 (ref 5.0–8.0)

## 2019-09-17 LAB — COMPREHENSIVE METABOLIC PANEL
ALT: 47 U/L — ABNORMAL HIGH (ref 0–44)
AST: 32 U/L (ref 15–41)
Albumin: 3 g/dL — ABNORMAL LOW (ref 3.5–5.0)
Alkaline Phosphatase: 150 U/L — ABNORMAL HIGH (ref 38–126)
Anion gap: 10 (ref 5–15)
BUN: 13 mg/dL (ref 6–20)
CO2: 21 mmol/L — ABNORMAL LOW (ref 22–32)
Calcium: 7.9 mg/dL — ABNORMAL LOW (ref 8.9–10.3)
Chloride: 101 mmol/L (ref 98–111)
Creatinine, Ser: 0.79 mg/dL (ref 0.44–1.00)
GFR calc Af Amer: >60 mL/min (ref >60)
GFR calc non Af Amer: >60 mL/min (ref >60)
Glucose, Bld: 89 mg/dL (ref 70–99)
Potassium: 3.1 mmol/L — ABNORMAL LOW (ref 3.5–5.1)
Sodium: 132 mmol/L — ABNORMAL LOW (ref 135–145)
Total Bilirubin: 1.5 mg/dL — ABNORMAL HIGH (ref 0.3–1.2)
Total Protein: 6.6 g/dL (ref 6.5–8.1)

## 2019-09-17 LAB — CBC
HCT: 34.2 % — ABNORMAL LOW (ref 36.0–46.0)
Hemoglobin: 10.7 g/dL — ABNORMAL LOW (ref 12.0–15.0)
MCH: 26.1 pg (ref 26.0–34.0)
MCHC: 31.3 g/dL (ref 30.0–36.0)
MCV: 83.4 fL (ref 80.0–100.0)
Platelets: 231 10*3/uL (ref 150–400)
RBC: 4.1 MIL/uL (ref 3.87–5.11)
RDW: 16.7 % — ABNORMAL HIGH (ref 11.5–15.5)
WBC: 8.2 10*3/uL (ref 4.0–10.5)
nRBC: 0 % (ref 0.0–0.2)

## 2019-09-17 LAB — LACTIC ACID, PLASMA: Lactic Acid, Venous: 1.1 mmol/L (ref 0.5–1.9)

## 2019-09-17 LAB — PREGNANCY, URINE: Preg Test, Ur: NEGATIVE

## 2019-09-17 LAB — SARS CORONAVIRUS 2 BY RT PCR (HOSPITAL ORDER, PERFORMED IN ~~LOC~~ HOSPITAL LAB): SARS Coronavirus 2: POSITIVE — AB

## 2019-09-17 MED ORDER — POTASSIUM CHLORIDE CRYS ER 20 MEQ PO TBCR
40.0000 meq | EXTENDED_RELEASE_TABLET | Freq: Once | ORAL | Status: AC
  Administered 2019-09-17: 40 meq via ORAL
  Filled 2019-09-17: qty 2

## 2019-09-17 NOTE — Progress Notes (Signed)
Subjective/Chief Complaint: none  Patient without complaints.  Covid positive test noted and patient on isolation.  Denies abdominal pain.  Denies any excessive ileostomy output.  Her previous studies were reviewed as outlined in my previous note.  Otherwise, she has no complaints about her abdomen today.  Her weakness and fatigue are better with IV fluids today.   Objective: Vital signs in last 24 hours: Temp:  [97.4 F (36.3 C)-97.8 F (36.6 C)] 97.8 F (36.6 C) (06/19 0555) Pulse Rate:  [82-135] 82 (06/19 0555) Resp:  [15-19] 18 (06/19 0555) BP: (82-139)/(53-95) 101/61 (06/19 0555) SpO2:  [94 %-100 %] 99 % (06/19 0555) Weight:  [90.7 kg] 90.7 kg (06/19 0252) Last BM Date: 09/17/19  Intake/Output from previous day: 06/18 0701 - 06/19 0700 In: 30 [I.V.:30] Out: 1 [Stool:1] Intake/Output this shift: No intake/output data recorded.  Incision/Wound: Abdomen soft nontender.  Drain is in place.  Feculent drainage as expected.  Ileostomy is functioning.  No signs of peritonitis.  Lab Results:  Recent Labs    09/16/19 1749 09/17/19 0340  WBC 13.4* 8.2  HGB 13.6 10.7*  HCT 44.4 34.2*  PLT 338 231   BMET Recent Labs    09/16/19 1749 09/17/19 0340  NA 136 132*  K 3.5 3.1*  CL 95* 101  CO2 24 21*  GLUCOSE 98 89  BUN 18 13  CREATININE 1.16* 0.79  CALCIUM 9.6 7.9*   PT/INR No results for input(s): LABPROT, INR in the last 72 hours. ABG No results for input(s): PHART, HCO3 in the last 72 hours.  Invalid input(s): PCO2, PO2  Studies/Results: CT ABDOMEN PELVIS W CONTRAST  Result Date: 09/16/2019 CLINICAL DATA:  Abdominal distension recent surgery pain EXAM: CT ABDOMEN AND PELVIS WITH CONTRAST TECHNIQUE: Multidetector CT imaging of the abdomen and pelvis was performed using the standard protocol following bolus administration of intravenous contrast. CONTRAST:  158mL OMNIPAQUE IOHEXOL 300 MG/ML  SOLN COMPARISON:  September 06, 2018 FINDINGS: Lower chest: The visualized  heart size within normal limits. No pericardial fluid/thickening. No hiatal hernia. Streaky atelectasis seen at the right base Hepatobiliary: The liver is normal in density without focal abnormality.The main portal vein is patent. The patient is status post cholecystectomy. No biliary ductal dilation. Pancreas: Unremarkable. No pancreatic ductal dilatation or surrounding inflammatory changes. Spleen: Normal in size without focal abnormality. Adrenals/Urinary Tract: Both adrenal glands appear normal. The kidneys and collecting system appear normal without evidence of urinary tract calculus or hydronephrosis. Bladder is unremarkable. Stomach/Bowel: The stomach is normal appearance. Again noted is a right lower quadrant diverting. No dilatation is seen. Prior sigmoidectomy is noted with a colonic anastomosis. There appears to be mild wall thickening the anastomotic site on prior exam. Again noted is a right-sided pigtail catheter extending into the deep pelvis within a collection containing contrast measuring approximately 2.5 x 3.1 cm and the collection with a small amount of contrast from the collection into a small bowel loop anteriorly, series 3, image 76. Vascular/Lymphatic: There are no enlarged mesenteric, retroperitoneal, or pelvic lymph nodes. No significant vascular findings are present. Reproductive:  The uterus and adnexa are unremarkable. Other: As described above Musculoskeletal: No acute or significant osseous findings. IMPRESSION: 1. Postsurgical changes with a diverting ileostomy and a sigmoid colonic anastomosis. There appears to be mild wall thickening with adjacent to the anastomotic site as on the prior exam. 2. Pigtail catheter within a deep pelvic collection which contains contrast and a small amount of contrast extending into the adjacent small bowel  loops, consistent with the coloenteric fistula as on recent fluoroscopic guided exam on September 14, 2019 Electronically Signed   By: Prudencio Pair M.D.    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  She will need follow-up in 2 weeks with Dr. [name].  May resume regular diet as tolerated.  Patient Active Problem List   Diagnosis Date Noted  . Weakness 09/16/2019  . AKI (acute kidney injury) ([place]) 09/16/2019  . Transaminitis 09/16/2019  . Ileostomy in place [facility]) 08/20/2019  . Postoperative anemia due to acute blood loss 08/20/2019  . 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 Anemia of chronic disease 06/25/2019  . Change in bowel habits 06/25/2019  . IUD threads lost 01/26/2019  . History of abnormal cervical Pap smear 01/25/2019  . Diverticulitis of large intestine with perforation and abscess 12/30/2018  . Hx of diverticulitis of colon 12/24/2018  . Dyspepsia 12/24/2018  . Non-intractable vomiting 12/24/2018  . Abnormal CT scan, pelvis 12/24/2018  . Urinary dysfunction 12/30/2017  . Varicose veins of bilateral lower extremities with pain 10/28/2017  . Low grade squamous intraepithelial lesion (LGSIL) on cervical Pap smear 10/29/2016  . 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 Essential hypertension 04/22/2016  . Other headache syndrome 02/05/2016  . Vitamin D deficiency 10/09/2015  . Other fatigue 10/09/2015  . Dysesthesia 04/11/2015  . Depression with anxiety 04/11/2015  . Gait disturbance 12/13/2014  . Insomnia 12/13/2014  . Multiple sclerosis ([place]) 08/30/2012     LOS: 0 days    Joyice Faster Trendon Zaring 09/17/2019

## 2019-09-17 NOTE — Progress Notes (Signed)
Sandra Bonilla  HAL:[id number] DOB: 1975-10-06 DOA: 09/16/2019 PCP: [name], MD    Brief Narrative:  44 year old with a history of MS, HTN, and recent prolonged hospitalization due to diverticulitis with perforation and abscess May 2021.  She underwent an elective sigmoidectomy and was found to have an anastomotic leak on day 7 postop requiring a diverting ileostomy with drains placed in 3 distinct intra-abdominal abscesses.  She briefly required TPN but was able to be advanced to an oral diet before discharge.  She was followed up 6/8 in IR at which time CT noted resolution of her abdominal abscesses allowing 2 of her 3 drains to be removed.  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  She presented to the [facility] ED 6/18 with complaints of severe generalized weakness.  She admitted that she had not felt well since the time of her discharge in May but that in the week prior to this presentation her symptoms have become significantly worse.  She noted shortness of breath with exertion and she was noted to be more tachycardic when participating with home PT. CT abdomen/pelvis in the ED noted deep pelvic collection consistent with her known coloenteric fistula which was unchanged from her fluoroscopic guided exam 6/16.  Significant Events: 6/18 admit via [facility] ED  Antimicrobials:  none   Subjective: The patient is resting comfortably in bed at the time of my visit.  She reports that she actually feels significantly better.  She denies fevers chills.  She denies any shortness of breath or chest pain.  She reports that her generalized weakness and severe fatigue have essentially resolved with IV fluid resuscitation.  The patient tells me she was actually diagnosed with Covid pneumonia in January of this year.  She tells me this was in fact confirmed with a positive test, that was obtained by her primary care practice  for a long time.  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  Acute kidney injury Resolved with simple volume resuscitation  Transaminitis Much improved with simple volume resuscitation  SARS-CoV-2 positive An unexpected finding - was negative on testing 08/01/19 as well as 03/17/19 - CXR without acute infiltrate and history confirms no symptoms whatsoever -further history reveals prior confirmed Covid January 2021 -unfortunately these results are not available to me via care everywhere for confirmation -our lab however reports to me that her N2 cycle threshold for her positive test yesterday was 40.2 which is consistent with a prior recovered infection -it is likely that her immunosuppressive therapy for MS is contributing to her prolonged testing positivity -we will keep her on isolation for now in hopes of confirming her positive test in January  HTN Blood pressure controlled in setting of dehydration  MS Continue usual therapy  DVT prophylaxis: SCDs Code Status: FULL CODE Family Communication:  Status is: Observation  The patient will require care spanning > 2 midnights and should be moved to inpatient because: Persistent severe electrolyte disturbances  Dispo: The patient  is from: Home              Anticipated d/c is to: Home              Anticipated d/c date is: 1 day              Patient currently is not medically stable to d/c.   Consultants:  Gen Surgery   Objective: Blood pressure 101/61, pulse 82,  temperature 97.8 F (36.6 C), temperature source Oral, resp. rate 18, height 5\' 6"  (1.676 m), weight 90.7 kg, SpO2 99 %.  Intake/Output Summary (Last 24 hours) at 09/17/2019 0826 Last data filed at 09/17/2019 0236 Gross per 24 hour  Intake 30.02 ml  Output 1 ml  Net 29.02 ml   Filed Weights   09/17/19 0252  Weight: 90.7 kg    Examination: General: No acute respiratory distress Lungs: Clear to auscultation bilaterally without wheezes or crackles Cardiovascular: Regular rate and rhythm without murmur gallop or rub normal S1 and S2 Abdomen: Nontender, nondistended, soft, bowel sounds positive, no rebound, no ascites, no appreciable mass Extremities: No significant cyanosis, clubbing, or edema bilateral lower extremities  CBC: Recent Labs  Lab 09/16/19 1749 09/17/19 0340  WBC 13.4* 8.2  NEUTROABS 9.8*  --   HGB 13.6 10.7*  HCT 44.4 34.2*  MCV 84.3 83.4  PLT 338 662   Basic Metabolic Panel: Recent Labs  Lab 09/16/19 1749 09/17/19 0340  NA 136 132*  K 3.5 3.1*  CL 95* 101  CO2 24 21*  GLUCOSE 98 89  BUN 18 13  CREATININE 1.16* 0.79  CALCIUM 9.6 7.9*  MG 1.7  --    GFR: Estimated Creatinine Clearance: 99.7 mL/min (by C-G formula based on SCr of 0.79 mg/dL).  Liver Function Tests: Recent Labs  Lab 09/16/19 1749 09/17/19 0340  AST 52* 32  ALT 69* 47*  ALKPHOS 197* 150*  BILITOT 1.5* 1.5*  PROT 8.9* 6.6  ALBUMIN 4.0 3.0*   Recent Labs  Lab 09/16/19 1749  LIPASE 35   No results for input(s): AMMONIA in the last 168 hours.  Coagulation Profile: No results for input(s): INR, PROTIME in the last 168 hours.  Cardiac Enzymes: No results for input(s): CKTOTAL, CKMB, CKMBINDEX, TROPONINI in the last 168 hours.  HbA1C: No results found for: HGBA1C  CBG: No results for input(s): GLUCAP in the last 168 hours.  Recent Results (from the past 240 hour(s))  SARS Coronavirus 2 by RT PCR (hospital order, performed in [facility] hospital lab) Nasopharyngeal  Nasopharyngeal Swab     Status: Abnormal   Collection Time: 09/16/19 11:43 PM   Specimen: Nasopharyngeal Swab  Result Value Ref Range Status   SARS Coronavirus 2 POSITIVE (A) NEGATIVE Final    Comment: CRITICAL RESULT CALLED TO, READ BACK BY AND VERIFIED WITH: Ronnell Guadalajara @ [phone] 09/17/2019 PERRY, J. (NOTE) SARS-CoV-2 target nucleic acids are DETECTED  SARS-CoV-2 RNA is generally detectable in upper respiratory specimens  during the acute phase of infection.  Positive results are indicative  of the presence of the identified virus, but do not rule out bacterial infection or co-infection with other pathogens not detected by the test.  Clinical correlation with patient history and  other diagnostic information is necessary to determine patient infection status.  The expected result is negative.  Fact Sheet for Patients:   StrictlyIdeas.no   Fact Sheet for Healthcare Providers:   BankingDealers.co.za    This test is not yet approved or cleared by the [place]  States FDA and  has been authorized for detection and/or diagnosis of SARS-CoV-2 by FDA under an Emergency Use Authorization (EUA).  This EUA will remain in effect  (meaning this test can be used) for the duration of  the COVID-19 declaration under Section 564(b)(1) of the Act, 21 U.S.C. section 360-bbb-3(b)(1), unless the authorization is terminated or revoked sooner.  Performed at [facility], [place] [address], [place], [place] [address]      Scheduled Meds:  cholecalciferol  1,000 Units Oral Daily   ferrous gluconate  324 mg Oral Q breakfast   folic acid  1 mg Oral Daily   loperamide  2 mg Oral TID AC   pantoprazole  40 mg Oral Daily   polycarbophil  625 mg Oral TID   potassium chloride  10 mEq Oral Daily   Teriflunomide  14 mg Oral Daily   umeclidinium-vilanterol  1 puff Inhalation Daily   Continuous Infusions:  sodium chloride 75 mL/hr at  09/17/19 0131     LOS: 0 days   Cherene Altes, MD [facility] Office  [phone] Pager - Text Page per Shea Evans  If 7PM-7AM, please contact night-coverage per Amion 09/17/2019, 8:26 AM

## 2019-09-17 NOTE — Progress Notes (Signed)
Pulmicort DC, pt. Is covid 19 and aerosol nebs are contraindicated

## 2019-09-17 NOTE — Progress Notes (Signed)
Report received from [place]. Pt received to room 1343 and pt ambulated to bed without difficulty. Pt given pt handbook. Education on use of call bell. Pt understands

## 2019-09-17 NOTE — Consult Note (Signed)
[facility] Nurse ostomy consult note Patient known to our team from previous admission.  Supplies ordered: 1-piece soft convex fecal drainable pouch with skin barrier ring and a belt accessory. Bedside RN instructed to order 5 pouches and 5 rings for bedside. [facility] will see when next on this campus, on or before Monday, 6/21.  [facility] nursing team will follow, and will remain available to this patient, the nursing and medical teams.  Thanks, Maudie Flakes, MSN, RN, [place], [facility] Abbott  Pager# [phone]

## 2019-09-17 NOTE — Plan of Care (Signed)
POC initiated 

## 2019-09-18 LAB — CBC
HCT: 32.5 % — ABNORMAL LOW (ref 36.0–46.0)
Hemoglobin: 10 g/dL — ABNORMAL LOW (ref 12.0–15.0)
MCH: 26 pg (ref 26.0–34.0)
MCHC: 30.8 g/dL (ref 30.0–36.0)
MCV: 84.6 fL (ref 80.0–100.0)
Platelets: 215 10*3/uL (ref 150–400)
RBC: 3.84 MIL/uL — ABNORMAL LOW (ref 3.87–5.11)
RDW: 16.5 % — ABNORMAL HIGH (ref 11.5–15.5)
WBC: 5.6 10*3/uL (ref 4.0–10.5)
nRBC: 0 % (ref 0.0–0.2)

## 2019-09-18 LAB — COMPREHENSIVE METABOLIC PANEL
ALT: 35 U/L (ref 0–44)
AST: 28 U/L (ref 15–41)
Albumin: 2.9 g/dL — ABNORMAL LOW (ref 3.5–5.0)
Alkaline Phosphatase: 133 U/L — ABNORMAL HIGH (ref 38–126)
Anion gap: 13 (ref 5–15)
BUN: 7 mg/dL (ref 6–20)
CO2: 21 mmol/L — ABNORMAL LOW (ref 22–32)
Calcium: 8 mg/dL — ABNORMAL LOW (ref 8.9–10.3)
Chloride: 106 mmol/L (ref 98–111)
Creatinine, Ser: 0.55 mg/dL (ref 0.44–1.00)
GFR calc Af Amer: >60 mL/min (ref >60)
GFR calc non Af Amer: >60 mL/min (ref >60)
Glucose, Bld: 71 mg/dL (ref 70–99)
Potassium: 3.1 mmol/L — ABNORMAL LOW (ref 3.5–5.1)
Sodium: 140 mmol/L (ref 135–145)
Total Bilirubin: 1.2 mg/dL (ref 0.3–1.2)
Total Protein: 6.1 g/dL — ABNORMAL LOW (ref 6.5–8.1)

## 2019-09-18 LAB — MAGNESIUM: Magnesium: 1.6 mg/dL — ABNORMAL LOW (ref 1.7–2.4)

## 2019-09-18 MED ORDER — POTASSIUM CHLORIDE IN NACL 20-0.9 MEQ/L-% IV SOLN
INTRAVENOUS | Status: DC
  Filled 2019-09-18 (×5): qty 1000

## 2019-09-18 MED ORDER — POTASSIUM CHLORIDE CRYS ER 20 MEQ PO TBCR
40.0000 meq | EXTENDED_RELEASE_TABLET | Freq: Once | ORAL | Status: AC
  Administered 2019-09-18: 40 meq via ORAL
  Filled 2019-09-18: qty 2

## 2019-09-18 MED ORDER — ONDANSETRON HCL 4 MG/2ML IJ SOLN
4.0000 mg | Freq: Four times a day (QID) | INTRAMUSCULAR | Status: DC | PRN
  Administered 2019-09-18 – 2019-09-20 (×3): 4 mg via INTRAVENOUS
  Filled 2019-09-18 (×3): qty 2

## 2019-09-18 MED ORDER — MAGNESIUM SULFATE 2 GM/50ML IV SOLN
2.0000 g | Freq: Once | INTRAVENOUS | Status: AC
  Administered 2019-09-18: 2 g via INTRAVENOUS
  Filled 2019-09-18: qty 50

## 2019-09-18 MED ORDER — POTASSIUM CHLORIDE CRYS ER 10 MEQ PO TBCR
10.0000 meq | EXTENDED_RELEASE_TABLET | Freq: Every day | ORAL | Status: DC
  Administered 2019-09-19 – 2019-09-23 (×5): 10 meq via ORAL
  Filled 2019-09-18 (×6): qty 1

## 2019-09-18 NOTE — Progress Notes (Signed)
Sandra Bonilla  VWU:[id number] DOB: [birth year] DOA: 09/16/2019 PCP: [name], MD    Brief Narrative:  44 year old with a history of MS, HTN, and recent prolonged hospitalization due to diverticulitis with perforation and abscess May 2021.  She underwent an elective sigmoidectomy and was found to have an anastomotic leak on day 7 postop requiring a diverting ileostomy with drains placed in 3 distinct intra-abdominal abscesses.  She briefly required TPN but was able to be advanced to an oral diet before discharge.  She was followed up 6/8 in IR at which time CT noted resolution of her abdominal abscesses allowing 2 of her 3 drains to be removed.  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  She presented to the [facility] ED 6/18 with complaints of severe generalized weakness.  She admitted that she had not felt well since the time of her discharge in May but that in the week prior to this presentation her symptoms have become significantly worse.  She noted shortness of breath with exertion and she was noted to be more tachycardic when participating with home PT. CT abdomen/pelvis in the ED noted deep pelvic collection consistent with her known coloenteric fistula which was unchanged from her fluoroscopic guided exam 6/16.  Significant Events: 6/18 admit via [facility] ED  Antimicrobials:  none   Subjective: The patient has had significant nausea this morning.  She has not vomited.  She reports no significant change in her baseline abdominal pain.  Her nausea has limited her ability to eat or drink.  She denies chest pain or shortness of breath.  Assessment & Plan:  5 weeks status post robotic sigmoid colectomy for diverticulitis complicated by anastomotic leak status post diagnostic laparoscopy with pelvic washout and drainage of pelvic abscess with diverting loop ileostomy now with coloenteric fistula on recent barium  enema   has been evaluated by general surgery - to f/u as outpt w/ surgery as previously planned - no need for acute intervention this hospital stay   Hyponatremia due to simple volume depletion - resolved   Hypokalemia Due to poor intake - supplement and follow-up in a.m.  Hypomagnesemia  Replace via IV - f/u in AM   Acute kidney injury Resolved with simple volume resuscitation  Transaminitis resolved with simple volume resuscitation  SARS-CoV-2 positive An unexpected finding - was negative on testing 08/01/19 as well as 03/17/19 - CXR without acute infiltrate and history confirms no symptoms whatsoever - further history reveals prior confirmed Covid January 2021 -unfortunately these results are not available to me via care everywhere for confirmation -our lab however reports to me that her N2 cycle threshold for her positive test yesterday was 40.2 which is consistent with a prior recovered infection -it is likely that her immunosuppressive therapy for MS is contributing to her prolonged testing positivity -we will keep her on isolation for now in hopes of confirming her positive test in January w/ her PCP   HTN Blood pressure controlled in setting of dehydration  MS Continue usual therapy  DVT prophylaxis: SCDs Code Status: FULL CODE Family Communication:  Status is: Observation  The patient will require care spanning > 2 midnights and should be moved to inpatient because: Persistent severe electrolyte disturbances  Dispo: The patient is from: Home              Anticipated d/c is to: Home  Anticipated d/c date is: 1 day              Patient currently is not medically stable to d/c.   Consultants:  Gen Surgery   Objective: Blood pressure 92/68, pulse 78, temperature 98.8 F (37.1 C), temperature source Oral, resp. rate 16, height 5\' 6"  (1.676 m), weight 90.7 kg, SpO2 92 %.  Intake/Output Summary (Last 24 hours) at 09/18/2019 0832 Last data filed at 09/18/2019  0400 Gross per 24 hour  Intake 2271.14 ml  Output --  Net 2271.14 ml   Filed Weights   09/17/19 0252  Weight: 90.7 kg    Examination: General: No acute respiratory distress Lungs: Clear to auscultation bilaterally  Cardiovascular: Regular rate and rhythm without murmur Abdomen: Nontender, nondistended, soft, bowel sounds positive Extremities: No signif edema bilateral lower extremities  CBC: Recent Labs  Lab 09/16/19 1749 09/17/19 0340 09/18/19 0449  WBC 13.4* 8.2 5.6  NEUTROABS 9.8*  --   --   HGB 13.6 10.7* 10.0*  HCT 44.4 34.2* 32.5*  MCV 84.3 83.4 84.6  PLT 338 231 403   Basic Metabolic Panel: Recent Labs  Lab 09/16/19 1749 09/17/19 0340 09/18/19 0449  NA 136 132* 140  K 3.5 3.1* 3.1*  CL 95* 101 106  CO2 24 21* 21*  GLUCOSE 98 89 71  BUN 18 13 7   CREATININE 1.16* 0.79 0.55  CALCIUM 9.6 7.9* 8.0*  MG 1.7  --  1.6*   GFR: Estimated Creatinine Clearance: 99.7 mL/min (by C-G formula based on SCr of 0.55 mg/dL).  Liver Function Tests: Recent Labs  Lab 09/16/19 1749 09/17/19 0340 09/18/19 0449  AST 52* 32 28  ALT 69* 47* 35  ALKPHOS 197* 150* 133*  BILITOT 1.5* 1.5* 1.2  PROT 8.9* 6.6 6.1*  ALBUMIN 4.0 3.0* 2.9*   Recent Labs  Lab 09/16/19 1749  LIPASE 35    Recent Results (from the past 240 hour(s))  SARS Coronavirus 2 by RT PCR (hospital order, performed in [facility] hospital lab) Nasopharyngeal Nasopharyngeal Swab     Status: Abnormal   Collection Time: 09/16/19 11:43 PM   Specimen: Nasopharyngeal Swab  Result Value Ref Range Status   SARS Coronavirus 2 POSITIVE (A) NEGATIVE Final    Comment: CRITICAL RESULT CALLED TO, READ BACK BY AND VERIFIED WITH: Ronnell Guadalajara @ 4742 09/17/2019 PERRY, J. (NOTE) SARS-CoV-2 target nucleic acids are DETECTED  SARS-CoV-2 RNA is generally detectable in upper respiratory specimens  during the acute phase of infection.  Positive results are indicative  of the presence of the identified virus, but do  not rule out bacterial infection or co-infection with other pathogens not detected by the test.  Clinical correlation with patient history and  other diagnostic information is necessary to determine patient infection status.  The expected result is negative.  Fact Sheet for Patients:   StrictlyIdeas.no   Fact Sheet for Healthcare Providers:   BankingDealers.co.za    This test is not yet approved or cleared by the [place] FDA and  has been authorized for detection and/or diagnosis of SARS-CoV-2 by FDA under an Emergency Use Authorization (EUA).  This EUA will remain in effect  (meaning this test can be used) for the duration of  the COVID-19 declaration under Section 564(b)(1) of the Act, 21 U.S.C. section 360-bbb-3(b)(1), unless the authorization is terminated or revoked sooner.  Performed at [facility], [place] [address] [place], [place] [address]      Scheduled Meds: . cholecalciferol  1,000 Units Oral Daily  . ferrous gluconate  324 mg Oral Q breakfast  . folic acid  1 mg Oral Daily  . loperamide  2 mg Oral TID AC  . pantoprazole  40 mg Oral Daily  . polycarbophil  625 mg Oral TID  . potassium chloride  10 mEq Oral Daily  . Teriflunomide  14 mg Oral Daily  . umeclidinium-vilanterol  1 puff Inhalation Daily   Continuous Infusions: . sodium chloride 75 mL/hr at 09/18/19 0324     LOS: 1 day   Cherene Altes, MD [facility] Office  [phone] Pager - Text Page per Amion  If 7PM-7AM, please contact night-coverage per Amion 09/18/2019, 8:32 AM

## 2019-09-19 LAB — COMPREHENSIVE METABOLIC PANEL
ALT: 33 U/L (ref 0–44)
AST: 30 U/L (ref 15–41)
Albumin: 2.9 g/dL — ABNORMAL LOW (ref 3.5–5.0)
Alkaline Phosphatase: 131 U/L — ABNORMAL HIGH (ref 38–126)
Anion gap: 10 (ref 5–15)
BUN: <5 mg/dL — ABNORMAL LOW (ref 6–20)
CO2: 24 mmol/L (ref 22–32)
Calcium: 7.8 mg/dL — ABNORMAL LOW (ref 8.9–10.3)
Chloride: 102 mmol/L (ref 98–111)
Creatinine, Ser: 0.61 mg/dL (ref 0.44–1.00)
GFR calc Af Amer: >60 mL/min (ref >60)
GFR calc non Af Amer: >60 mL/min (ref >60)
Glucose, Bld: 65 mg/dL — ABNORMAL LOW (ref 70–99)
Potassium: 3.5 mmol/L (ref 3.5–5.1)
Sodium: 136 mmol/L (ref 135–145)
Total Bilirubin: 1.2 mg/dL (ref 0.3–1.2)
Total Protein: 6.3 g/dL — ABNORMAL LOW (ref 6.5–8.1)

## 2019-09-19 LAB — CBC
HCT: 33.4 % — ABNORMAL LOW (ref 36.0–46.0)
Hemoglobin: 10.2 g/dL — ABNORMAL LOW (ref 12.0–15.0)
MCH: 26 pg (ref 26.0–34.0)
MCHC: 30.5 g/dL (ref 30.0–36.0)
MCV: 85 fL (ref 80.0–100.0)
Platelets: 241 10*3/uL (ref 150–400)
RBC: 3.93 MIL/uL (ref 3.87–5.11)
RDW: 16.3 % — ABNORMAL HIGH (ref 11.5–15.5)
WBC: 5.6 10*3/uL (ref 4.0–10.5)
nRBC: 0 % (ref 0.0–0.2)

## 2019-09-19 LAB — MAGNESIUM: Magnesium: 2.1 mg/dL (ref 1.7–2.4)

## 2019-09-19 MED ORDER — LOPERAMIDE HCL 2 MG PO CAPS: 2.0000 mg | ORAL_CAPSULE | ORAL | Status: DC | PRN

## 2019-09-19 MED ORDER — OXYBUTYNIN CHLORIDE 5 MG PO TABS
5.0000 mg | ORAL_TABLET | Freq: Two times a day (BID) | ORAL | Status: DC
  Administered 2019-09-19 – 2019-09-24 (×10): 5 mg via ORAL
  Filled 2019-09-19 (×10): qty 1

## 2019-09-19 NOTE — Progress Notes (Signed)
Sandra Bonilla  IZT:[id number] DOB: [birth year] DOA: 09/16/2019 PCP: [name], MD    Brief Narrative:  44 year old with a history of MS, HTN, and recent prolonged hospitalization due to diverticulitis with perforation and abscess May 2021.  She underwent an elective sigmoidectomy and was found to have an anastomotic leak on day 7 postop requiring a diverting ileostomy with drains placed in 3 distinct intra-abdominal abscesses.  She briefly required TPN but was able to be advanced to an oral diet before discharge.  She was followed up 6/8 in IR at which time CT noted resolution of her abdominal abscesses allowing 2 of her 3 drains to be removed.  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  She presented to the [facility] ED 6/18 with complaints of severe generalized weakness. She admitted that she had not felt well since the time of her discharge in May but that in the week prior to this presentation her symptoms had become significantly worse.  She noted shortness of breath with exertion and she was noted to be more tachycardic when participating with home PT. CT abdomen/pelvis in the ED noted a deep pelvic collection consistent with her known anastomotic leak which was unchanged from her fluoroscopic guided exam 6/16.  Significant Events: 6/18 admit via [facility]  Antimicrobials:  none   Subjective: Patient reports nausea has improved somewhat but not resolved.  She continues to report very minimal to no oral intake due to intolerance.  She denies chest pain shortness of breath.  She reports her abdominal discomfort is without change.  She does feel she is beginning to see more output from her intra-abdominal/pelvic drain.  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  Acute kidney injury Resolved with simple volume resuscitation  Transaminitis resolved with simple volume resuscitation  SARS-CoV-2 positive An unexpected finding - was negative on testing 08/01/19 as well as 03/17/19 - CXR without acute infiltrate - HPI confirms no symptoms whatsoever - further history reveals prior confirmed COVID infection w/ + test on April 15, 2019 (which I was able to confirm by calling her PCP office - [facility] in [place], [place]) - her N2 cycle threshold for her positive test this admission was 40.2 which is consistent with a prior recovered infection -it is likely that her immunosuppressive therapy for MS is contributing to her prolonged testing positivity - given confirmation of her prior infection and + test, her current lack of COVID sx, and her high CT value, she can safely be taken off precautions and does not have to be considered an infectious risk to others   HTN Blood pressure controlled   MS Continue usual therapy  DVT prophylaxis: SCDs Code Status: FULL CODE Family Communication:  Status is: Inpatient  Remains inpatient appropriate because:IV treatments appropriate due to intensity of illness or inability to  take PO   Dispo: The patient is from: Home              Anticipated d/c is to: Home              Anticipated d/c date is: 2 days              Patient currently is not medically stable to d/c.   Consultants:  Gen Surgery    Objective: Blood pressure 104/61, pulse 72, temperature 97.6 F (36.4 C), temperature source Oral, resp. rate 18, height 5\' 6"  (1.676 m), weight 90.7 kg, SpO2 94 %.  Intake/Output Summary (Last 24 hours) at 09/19/2019 0840 Last data filed at 09/19/2019 0800 Gross per 24 hour  Intake 445.1 ml  Output 21 ml  Net 424.1 ml   Filed Weights   09/17/19 0252  Weight: 90.7 kg    Examination: General: No acute respiratory distress Lungs: CTA B - no wheezing  Cardiovascular: RRR - no M  Abdomen: Nontender, nondistended, soft, bowel sounds positive Extremities: No signif edema B LE   CBC: Recent Labs  Lab 09/16/19 1749 09/16/19 1749 09/17/19 0340 09/18/19 0449 09/19/19 0439  WBC 13.4*   < > 8.2 5.6 5.6  NEUTROABS 9.8*  --   --   --   --   HGB 13.6   < > 10.7* 10.0* 10.2*  HCT 44.4   < > 34.2* 32.5* 33.4*  MCV 84.3   < > 83.4 84.6 85.0  PLT 338   < > 231 215 241   < > = values in this interval not displayed.   Basic Metabolic Panel: Recent Labs  Lab 09/16/19 1749 09/16/19 1749 09/17/19 0340 09/18/19 0449 09/19/19 0439  NA 136   < > 132* 140 136  K 3.5   < > 3.1* 3.1* 3.5  CL 95*   < > 101 106 102  CO2 24   < > 21* 21* 24  GLUCOSE 98   < > 89 71 65*  BUN 18   < > 13 7 <5*  CREATININE 1.16*   < > 0.79 0.55 0.61  CALCIUM 9.6   < > 7.9* 8.0* 7.8*  MG 1.7  --   --  1.6* 2.1   < > = values in this interval not displayed.   GFR: Estimated Creatinine Clearance: 99.7 mL/min (by C-G formula based on SCr of 0.61 mg/dL).  Liver Function Tests: Recent Labs  Lab 09/16/19 1749 09/17/19 0340 09/18/19 0449 09/19/19 0439  AST 52* 32 28 30  ALT 69* 47* 35 33  ALKPHOS 197* 150* 133* 131*  BILITOT 1.5* 1.5* 1.2 1.2  PROT 8.9* 6.6 6.1* 6.3*  ALBUMIN 4.0 3.0* 2.9* 2.9*   Recent Labs  Lab 09/16/19 1749  LIPASE 35    Recent Results (from the past 240 hour(s))  SARS Coronavirus 2 by RT PCR (hospital order, performed in [facility] hospital lab) Nasopharyngeal  Nasopharyngeal Swab     Status: Abnormal   Collection Time: 09/16/19 11:43 PM   Specimen: Nasopharyngeal Swab  Result Value Ref Range Status   SARS Coronavirus 2 POSITIVE (A) NEGATIVE Final    Comment: CRITICAL RESULT CALLED TO, READ BACK BY AND VERIFIED WITH: Ronnell Guadalajara @ 6644 09/17/2019 PERRY, J. (NOTE) SARS-CoV-2 target nucleic acids are DETECTED  SARS-CoV-2 RNA is generally detectable in upper respiratory specimens  during the acute phase of infection.  Positive results are indicative  of the presence of the identified virus, but do not  rule out bacterial infection or co-infection with other pathogens not detected by the test.  Clinical correlation with patient history and  other diagnostic information is necessary to determine patient infection status.  The expected result is negative.  Fact Sheet for Patients:   StrictlyIdeas.no   Fact Sheet for Healthcare Providers:   BankingDealers.co.za    This test is not yet approved or cleared by the [place] FDA and  has been authorized for detection and/or diagnosis of SARS-CoV-2 by FDA under an Emergency Use Authorization (EUA).  This EUA will remain in effect  (meaning this test can be used) for the duration of  the COVID-19 declaration under Section 564(b)(1) of the Act, 21 U.S.C. section 360-bbb-3(b)(1), unless the authorization is terminated or revoked sooner.  Performed at [facility], [place] [address] [place], [place] [address]      Scheduled Meds: . cholecalciferol  1,000 Units Oral Daily  . ferrous gluconate  324 mg Oral Q breakfast  . folic acid  1 mg Oral Daily  . loperamide  2 mg Oral TID AC  . pantoprazole  40 mg Oral Daily  . polycarbophil  625 mg Oral TID  . potassium chloride  10 mEq Oral Daily  . Teriflunomide  14 mg Oral Daily  . umeclidinium-vilanterol  1 puff Inhalation Daily   Continuous Infusions: . 0.9 % NaCl with KCl 20 mEq  / L 50 mL/hr at 09/18/19 2053     LOS: 2 days   Cherene Altes, MD [facility] Office  [phone] Pager - Text Page per Amion  If 7PM-7AM, please contact night-coverage per Amion 09/19/2019, 8:40 AM

## 2019-09-19 NOTE — Progress Notes (Signed)
I am aware of pt's hospitalization and will follow along.  Please contact me if you have any surgical questions.  I do not think she has a colo-enteric fistula.  She does have a persistent anastomotic leak and will need to keep her surgical drain in place for now.  Diet as tolerated.  Please call me if you have any questions or concerns.    Rosario Adie, MD  [facility]  [phone]

## 2019-09-19 NOTE — Plan of Care (Signed)
  Problem: Health Behavior/Discharge Planning: Goal: Ability to manage health-related needs will improve Outcome: Progressing   Problem: Education: Goal: Knowledge of General Education information will improve Description: Including pain rating scale, medication(s)/side effects and non-pharmacologic comfort measures Outcome: Progressing   Problem: Clinical Measurements: Goal: Will remain free from infection Outcome: Progressing   Problem: Clinical Measurements: Goal: Diagnostic test results will improve Outcome: Progressing   Problem: Clinical Measurements: Goal: Ability to maintain clinical measurements within normal limits will improve Outcome: Progressing

## 2019-09-20 LAB — CBC
HCT: 34.2 % — ABNORMAL LOW (ref 36.0–46.0)
Hemoglobin: 10.6 g/dL — ABNORMAL LOW (ref 12.0–15.0)
MCH: 26.1 pg (ref 26.0–34.0)
MCHC: 31 g/dL (ref 30.0–36.0)
MCV: 84.2 fL (ref 80.0–100.0)
Platelets: 224 10*3/uL (ref 150–400)
RBC: 4.06 MIL/uL (ref 3.87–5.11)
RDW: 16.3 % — ABNORMAL HIGH (ref 11.5–15.5)
WBC: 5.2 10*3/uL (ref 4.0–10.5)
nRBC: 0 % (ref 0.0–0.2)

## 2019-09-20 LAB — COMPREHENSIVE METABOLIC PANEL
ALT: 29 U/L (ref 0–44)
AST: 26 U/L (ref 15–41)
Albumin: 2.7 g/dL — ABNORMAL LOW (ref 3.5–5.0)
Alkaline Phosphatase: 129 U/L — ABNORMAL HIGH (ref 38–126)
Anion gap: 12 (ref 5–15)
BUN: <5 mg/dL — ABNORMAL LOW (ref 6–20)
CO2: 23 mmol/L (ref 22–32)
Calcium: 8 mg/dL — ABNORMAL LOW (ref 8.9–10.3)
Chloride: 103 mmol/L (ref 98–111)
Creatinine, Ser: 0.65 mg/dL (ref 0.44–1.00)
GFR calc Af Amer: >60 mL/min (ref >60)
GFR calc non Af Amer: >60 mL/min (ref >60)
Glucose, Bld: 63 mg/dL — ABNORMAL LOW (ref 70–99)
Potassium: 3.5 mmol/L (ref 3.5–5.1)
Sodium: 138 mmol/L (ref 135–145)
Total Bilirubin: 1.3 mg/dL — ABNORMAL HIGH (ref 0.3–1.2)
Total Protein: 6.2 g/dL — ABNORMAL LOW (ref 6.5–8.1)

## 2019-09-20 LAB — MAGNESIUM: Magnesium: 1.6 mg/dL — ABNORMAL LOW (ref 1.7–2.4)

## 2019-09-20 MED ORDER — MAGNESIUM SULFATE 2 GM/50ML IV SOLN
2.0000 g | Freq: Once | INTRAVENOUS | Status: AC
  Administered 2019-09-20: 2 g via INTRAVENOUS
  Filled 2019-09-20: qty 50

## 2019-09-20 NOTE — TOC Progression Note (Signed)
Transition of Care [facility]) - Progression Note    Patient Details  Name: Sandra Bonilla MRN: [id number] Date of Birth: [birth year]  Transition of Care [facility]) CM/SW Contact  Purcell Mouton, RN Phone Number: 09/20/2019, 4:42 PM  Clinical Narrative:    Pt is active with [facility]) for HHRN/HHPT and will need orders. Thanks   Expected Discharge Plan: Home/Self Care Barriers to Discharge: No Barriers Identified  Expected Discharge Plan and Services Expected Discharge Plan: Home/Self Care       Living arrangements for the past 2 months: Single Family Home Expected Discharge Date: 09/20/19                                     Social Determinants of Health (SDOH) Interventions    Readmission Risk Interventions No flowsheet data found.

## 2019-09-20 NOTE — Progress Notes (Signed)
Sandra Bonilla  SEL:[id number] DOB: 11/02/1975 DOA: 09/16/2019 PCP: [name], MD    Brief Narrative:  44 year old with a history of MS, HTN, and recent prolonged hospitalization due to diverticulitis with perforation and abscess May 2021.  She underwent an elective sigmoidectomy and was found to have an anastomotic leak on day 7 postop requiring a diverting ileostomy with drains placed in 3 distinct intra-abdominal abscesses.  She briefly required TPN but was able to be advanced to an oral diet before discharge.  She was followed up 6/8 in IR at which time CT noted resolution of her abdominal abscesses allowing 2 of her 3 drains to be removed.  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  She presented to the [facility] ED 6/18 with complaints of severe generalized weakness. She admitted that she had not felt well since the time of her discharge in May but that in the week prior to this presentation her symptoms had become significantly worse.  She noted shortness of breath with exertion and she was noted to be more tachycardic when participating with home PT. CT abdomen/pelvis in the ED noted a deep pelvic collection consistent with her known anastomotic leak which was unchanged from her fluoroscopic guided exam 6/16.  Significant Events: 6/18 admit via [facility], ED  Antimicrobials:  none   Subjective: Continues to report very minimal oral intake and essentially no diet.  Reports intermittent mild crampy abdominal pain but no new acute pain.  Reports ongoing significant output in her surgical drain.  Denies chest pain or shortness of breath.  Has not yet been up out of bed to a significant extent.  Assessment & Plan:  5 weeks status post robotic sigmoid colectomy for diverticulitis complicated by anastomotic leak status post diagnostic laparoscopy with pelvic washout and drainage of pelvic abscess with  diverting loop ileostomy now with ?coloenteric fistula v/s persistent leak on recent barium enema   has been evaluated by general surgery - to f/u as outpt w/ surgery as previously planned - no need for acute intervention this hospital stay per surgery - her primary surgeon is following along with our treatment  FTT This has been a recurring issue with this patient during the course of her complex GI illness -she is still not consistently eating or drinking and therefore not yet safe for discharge home -I have encouraged her to get up and out of bed and mobilize -I have encouraged her to eat and drink as much as she possibly can  Hyponatremia due to simple volume depletion - resolved   Hypokalemia Due to poor intake -corrected with supplementation  Hypomagnesemia  Due to poor oral intake and GI losses -supplement and recheck in a.m.  Acute kidney injury Resolved with simple volume resuscitation  Transaminitis resolved with simple volume resuscitation  SARS-CoV-2 positive An unexpected finding - was negative on testing 08/01/19 as well as 03/17/19 - CXR without acute infiltrate - HPI confirms no symptoms whatsoever - further history reveals prior confirmed COVID infection w/ + test on April 15, 2019 (which I was able to confirm by calling her PCP office - [facility] in [place], [place]) - her N2 cycle threshold for her positive test this admission was 40.2 which is consistent with a prior recovered infection -it is likely that her immunosuppressive therapy for MS is contributing to her prolonged testing positivity - given confirmation of her prior infection and prior +  test, her current lack of COVID sx, and her high CT value, she can safely be taken off precautions and does not have to be considered an infectious risk to others   HTN Blood pressure controlled   MS Continue usual therapy  DVT prophylaxis: SCDs Code Status: FULL CODE Family Communication:  Status is:  Inpatient  Remains inpatient appropriate because:IV treatments appropriate due to intensity of illness or inability to take PO   Dispo: The patient is from: Home              Anticipated d/c is to: Home              Anticipated d/c date is: 6/23?              Patient currently is not medically stable to d/c.   Consultants:  Gen Surgery   Objective: Blood pressure 104/67, pulse 76, temperature 97.6 F (36.4 C), temperature source Oral, resp. rate 17, height 5\' 6"  (1.676 m), weight 90.7 kg, SpO2 97 %.  Intake/Output Summary (Last 24 hours) at 09/20/2019 0837 Last data filed at 09/20/2019 0700 Gross per 24 hour  Intake 0 ml  Output 120 ml  Net -120 ml   Filed Weights   09/17/19 0252  Weight: 90.7 kg    Examination: General: No acute respiratory distress Lungs: CTA B without wheezing or crackles Cardiovascular: RRR Abdomen: Nontender, nondistended, soft, bowel sounds positive Extremities: No edema B LE   CBC: Recent Labs  Lab 09/16/19 1749 09/17/19 0340 09/18/19 0449 09/19/19 0439 09/20/19 0419  WBC 13.4*   < > 5.6 5.6 5.2  NEUTROABS 9.8*  --   --   --   --   HGB 13.6   < > 10.0* 10.2* 10.6*  HCT 44.4   < > 32.5* 33.4* 34.2*  MCV 84.3   < > 84.6 85.0 84.2  PLT 338   < > 215 241 224   < > = values in this interval not displayed.   Basic Metabolic Panel: Recent Labs  Lab 09/18/19 0449 09/19/19 0439 09/20/19 0419  NA 140 136 138  K 3.1* 3.5 3.5  CL 106 102 103  CO2 21* 24 23  GLUCOSE 71 65* 63*  BUN 7 <5* <5*  CREATININE 0.55 0.61 0.65  CALCIUM 8.0* 7.8* 8.0*  MG 1.6* 2.1 1.6*   GFR: Estimated Creatinine Clearance: 99.7 mL/min (by C-G formula based on SCr of 0.65 mg/dL).  Liver Function Tests: Recent Labs  Lab 09/17/19 0340 09/18/19 0449 09/19/19 0439 09/20/19 0419  AST 32 28 30 26   ALT 47* 35 33 29  ALKPHOS 150* 133* 131* 129*  BILITOT 1.5* 1.2 1.2 1.3*  PROT 6.6 6.1* 6.3* 6.2*  ALBUMIN 3.0* 2.9* 2.9* 2.7*   Recent Labs  Lab  09/16/19 1749  LIPASE 35    Recent Results (from the past 240 hour(s))  SARS Coronavirus 2 by RT PCR (hospital order, performed in [facility] hospital lab) Nasopharyngeal Nasopharyngeal Swab     Status: Abnormal   Collection Time: 09/16/19 11:43 PM   Specimen: Nasopharyngeal Swab  Result Value Ref Range Status   SARS Coronavirus 2 POSITIVE (A) NEGATIVE Final    Comment: CRITICAL RESULT CALLED TO, READ BACK BY AND VERIFIED WITH: Ronnell Guadalajara @ 0626 09/17/2019 PERRY, J. (NOTE) SARS-CoV-2 target nucleic acids are DETECTED  SARS-CoV-2 RNA is generally detectable in upper respiratory specimens  during the acute phase of infection.  Positive results are indicative  of the presence of the  identified virus, but do not rule out bacterial infection or co-infection with other pathogens not detected by the test.  Clinical correlation with patient history and  other diagnostic information is necessary to determine patient infection status.  The expected result is negative.  Fact Sheet for Patients:   StrictlyIdeas.no   Fact Sheet for Healthcare Providers:   BankingDealers.co.za    This test is not yet approved or cleared by the [place] FDA and  has been authorized for detection and/or diagnosis of SARS-CoV-2 by FDA under an Emergency Use Authorization (EUA).  This EUA will remain in effect  (meaning this test can be used) for the duration of  the COVID-19 declaration under Section 564(b)(1) of the Act, 21 U.S.C. section 360-bbb-3(b)(1), unless the authorization is terminated or revoked sooner.  Performed at [facility], [place] [address] [place], [place] [address]      Scheduled Meds: . cholecalciferol  1,000 Units Oral Daily  . ferrous gluconate  324 mg Oral Q breakfast  . folic acid  1 mg Oral Daily  . oxybutynin  5 mg Oral BID  . pantoprazole  40 mg Oral Daily  . polycarbophil  625 mg Oral TID  .  potassium chloride  10 mEq Oral Daily  . Teriflunomide  14 mg Oral Daily  . umeclidinium-vilanterol  1 puff Inhalation Daily   Continuous Infusions: . 0.9 % NaCl with KCl 20 mEq / L 75 mL/hr at 09/19/19 1639  . magnesium sulfate bolus IVPB 2 g (09/20/19 0800)     LOS: 3 days   Cherene Altes, MD [facility] Office  [phone] Pager - Text Page per Amion  If 7PM-7AM, please contact night-coverage per Amion 09/20/2019, 8:37 AM

## 2019-09-20 NOTE — Consult Note (Signed)
WOC by to assess ostomy needs. Patient is independent with care has soft convex, barrier rings and belt ordered by my partner over the weekend. No other needs identified at this time.   Re consult if needed, will not follow at this time. Thanks  Sevan Mcbroom [employer], RN,CWOCN, CNS, [place] [phone])

## 2019-09-20 NOTE — Plan of Care (Signed)
  Problem: Education: Goal: Knowledge of General Education information will improve Description Including pain rating scale, medication(s)/side effects and non-pharmacologic comfort measures Outcome: Progressing   Problem: Health Behavior/Discharge Planning: Goal: Ability to manage health-related needs will improve Outcome: Progressing   

## 2019-09-21 DIAGNOSIS — E162 Hypoglycemia, unspecified: Secondary | ICD-10-CM

## 2019-09-21 DIAGNOSIS — R627 Adult failure to thrive: Secondary | ICD-10-CM

## 2019-09-21 LAB — GLUCOSE, CAPILLARY
Glucose-Capillary: 49 mg/dL — ABNORMAL LOW (ref 70–99)
Glucose-Capillary: 61 mg/dL — ABNORMAL LOW (ref 70–99)
Glucose-Capillary: 75 mg/dL (ref 70–99)
Glucose-Capillary: 67 mg/dL — ABNORMAL LOW (ref 70–99)
Glucose-Capillary: 78 mg/dL (ref 70–99)

## 2019-09-21 LAB — BASIC METABOLIC PANEL
Anion gap: 11 (ref 5–15)
BUN: <5 mg/dL — ABNORMAL LOW (ref 6–20)
CO2: 22 mmol/L (ref 22–32)
Calcium: 7.8 mg/dL — ABNORMAL LOW (ref 8.9–10.3)
Chloride: 105 mmol/L (ref 98–111)
Creatinine, Ser: 0.59 mg/dL (ref 0.44–1.00)
GFR calc Af Amer: >60 mL/min (ref >60)
GFR calc non Af Amer: >60 mL/min (ref >60)
Glucose, Bld: 59 mg/dL — ABNORMAL LOW (ref 70–99)
Potassium: 3.5 mmol/L (ref 3.5–5.1)
Sodium: 138 mmol/L (ref 135–145)

## 2019-09-21 LAB — MAGNESIUM: Magnesium: 1.7 mg/dL (ref 1.7–2.4)

## 2019-09-21 MED ORDER — OXYCODONE HCL 5 MG PO TABS
5.0000 mg | ORAL_TABLET | Freq: Four times a day (QID) | ORAL | Status: DC | PRN
  Administered 2019-09-21 – 2019-09-24 (×5): 10 mg via ORAL
  Filled 2019-09-21 (×5): qty 2

## 2019-09-21 MED ORDER — DIPHENHYDRAMINE HCL 25 MG PO CAPS
25.0000 mg | ORAL_CAPSULE | Freq: Once | ORAL | Status: AC
  Administered 2019-09-21: 25 mg via ORAL
  Filled 2019-09-21: qty 1

## 2019-09-21 MED ORDER — HYDROCORTISONE 1 % EX CREA
1.0000 "application " | TOPICAL_CREAM | Freq: Three times a day (TID) | CUTANEOUS | Status: DC | PRN
  Filled 2019-09-21: qty 28

## 2019-09-21 MED ORDER — ACETAMINOPHEN 325 MG PO TABS
650.0000 mg | ORAL_TABLET | Freq: Four times a day (QID) | ORAL | Status: DC | PRN
  Administered 2019-09-23: 650 mg via ORAL
  Filled 2019-09-21: qty 2

## 2019-09-21 MED ORDER — UMECLIDINIUM-VILANTEROL 62.5-25 MCG/INH IN AEPB
1.0000 | INHALATION_SPRAY | Freq: Every day | RESPIRATORY_TRACT | Status: DC
  Filled 2019-09-21: qty 14

## 2019-09-21 MED ORDER — MORPHINE SULFATE (PF) 2 MG/ML IV SOLN
2.0000 mg | INTRAVENOUS | Status: DC | PRN
  Administered 2019-09-22: 2 mg via INTRAVENOUS
  Filled 2019-09-21: qty 1

## 2019-09-21 MED ORDER — LOPERAMIDE HCL 2 MG PO CAPS
2.0000 mg | ORAL_CAPSULE | Freq: Three times a day (TID) | ORAL | Status: DC
  Administered 2019-09-21 – 2019-09-24 (×8): 2 mg via ORAL
  Filled 2019-09-21 (×8): qty 1

## 2019-09-21 MED ORDER — DEXTROSE-NACL 5-0.45 % IV SOLN: INTRAVENOUS | Status: DC

## 2019-09-21 NOTE — Progress Notes (Signed)
PROGRESS NOTE  Sandra Bonilla HMC:[id number] DOB: 04-02-1975 DOA: 09/16/2019 PCP: [name], MD  Brief History   44 year old woman PMH multiple sclerosis, recent complicated diverticulitis requiring sigmoidectomy and diverting loop ileostomy and multiple pelvic drains, complicated by anastomotic leak and coloenteric fistula.  Admitted for generalized weakness secondary to poor oral intake secondary to complications from abdominal surgery.  A & P  Generalized weakness, failure to thrive, mild hypoglycemia secondary to complex GI illness. --Secondary to very poor, minimal oral intake --Needs to push fluids, increase oral intake. --Trial off IV fluids to increase thirst.  Monitor for symptomatic/significant hypoglycemia.  AKI, hyponatremia, hypokalemia secondary to poor oral intake --Acute kidney injury resolved.  Hyponatremia resolved.  Potassium within normal limits.  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  Chest x-ray no acute infiltrate.  No symptoms.  Previous Covid infection April 15, 2019 confirmed.  Additionally test this admission was consistent with prior covert infection.  Likely immunosuppressive therapy for multiple sclerosis is contributing to prolonged testing positivity.  Given confirmation of prior infection, lack of Covid symptoms, patient taken off precautions and is not considered an infectious risk to others.  Disposition Plan:  Discussion: Failure to thrive, minimal oral intake, not enough to  maintain hydration.  No opportunity for discharge until oral intake improves.  Needs close monitoring for significant hypoglycemia.  Transition to oral medication.  Status is: Inpatient  Remains inpatient appropriate because:Inpatient level of care appropriate due to severity of illness   Dispo: The patient is from: Home              Anticipated d/c is to: Home HHPT, RN              Anticipated d/c date is: 1 day              Patient currently is not medically stable to d/c.  DVT prophylaxis:  SCDs Start: 09/16/19 2335   Code Status: Full Family Communication: none  Murray Hodgkins, MD  [facility] Direct contact: see [email or web address] (further directions at bottom of note if needed) 7PM-7AM contact night coverage as at bottom of note 09/21/2019, 3:47 PM  LOS: 4 days   Significant Hospital Events   .    Consults:  .    Procedures:  .   Significant Diagnostic Tests:  Marland Kitchen    Micro Data:  .    Antimicrobials:  .   Interval History/Subjective  Has pain at the right lower quadrant drain site.  Intermittent nausea.  No vomiting.  Very poor appetite.  Does not feel like eating or drinking.  Objective   Vitals:  Vitals:   09/21/19 0615 09/21/19 1226  BP: 104/68 101/73  Pulse: 84 79  Resp: 16 18  Temp: 97.6 F (36.4 C) 97.6 F (36.4 C)  SpO2: 98% 100%    Exam:  Constitutional.  Appears calm, comfortable. Respiratory.  Clear to auscultation bilaterally.  No wheezes, rales or rhonchi.  Normal respiratory effort. Cardiovascular.  Regular rate and rhythm.  No murmur, rub or gallop.  No lower extremity edema. Abdomen.  Drain right lower quadrant.  Abdomen soft. Psychiatric.  Grossly normal mood and affect.  Speech fluent and appropriate.  I have personally reviewed the following:   Today's Data  . Basic metabolic panel unremarkable except for glucose of 59. . Magnesium 1.7.  Scheduled Meds: . cholecalciferol  1,000 Units Oral Daily  . ferrous gluconate  324 mg  Oral Q breakfast  . folic acid  1 mg Oral Daily  . oxybutynin  5 mg Oral BID  . pantoprazole  40 mg Oral Daily  . polycarbophil  625 mg Oral TID  . potassium chloride  10 mEq Oral Daily  . Teriflunomide  14 mg Oral Daily  . 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 Attending or Consulting provider [place] or covering provider during after hours [place], for this patient?  1. Check the care team in [facility] and look for a) attending/consulting TRH provider listed and b) the [facility] team listed 2. Log into [email or web address] and use [place]'s universal password to access. If you do not have the password, please contact the hospital operator. 3. Locate the [facility] provider you are looking for under [facility] and page to a number that you can be directly reached. 4. If you still have difficulty reaching the provider, please page the [facility] (Director on Call) for the Hospitalists listed on amion for assistance.

## 2019-09-21 NOTE — Evaluation (Signed)
Physical Therapy Evaluation Only Patient Details Name: Sandra Bonilla MRN: [id number] DOB: Feb 08, 1976 Today's Date: 09/21/2019   History of Present Illness  44 year old with a history of MS, HTN, and recent prolonged hospitalization due to diverticulitis with perforation and abscess May 2021.  She underwent an elective sigmoidectomy and was found to have an anastomotic leak on day 7 postop requiring a diverting ileostomy with drains placed in 3 distinct intra-abdominal abscesses.  She briefly required TPN but was able to be advanced to an oral diet before discharge.  She was followed up 6/8 in IR at which time CT noted resolution of her abdominal abscesses allowing 2 of her 3 drains to be removed.  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 She presented to the [facility] ED 6/18 with complaints of severe generalized weakness. She admitted that she had not felt well since the time of her discharge in May but that in the week prior to this presentation her symptoms had become significantly worse.  She noted shortness of breath with exertion and she was noted to be more tachycardic when participating with home PT. CT abdomen/pelvis in the ED noted a deep pelvic collection consistent with her known anastomotic leak which was unchanged from her fluoroscopic guided exam 6/16.  Clinical Impression  Physical therapy evaluation completed, patient is at baseline, mod I with mobility and good safety awareness and no further acute PT services recommended at this time. Pt with good family support and has equipment needed for home. Rec HHPT to f/u once d/c for strengthening and endurance training in home environment. Patient discharged to care of nursing for ambulation daily as tolerated for length of stay.     Follow Up Recommendations Home health PT;Supervision - Intermittent    Equipment Recommendations  None recommended by PT     Recommendations for Other Services       Precautions / Restrictions Precautions Precautions: None Precaution Comments: 1 JP drain, ostomy Restrictions Weight Bearing Restrictions: No      Mobility  Bed Mobility Overal bed mobility: Modified Independent  General bed mobility comments: slightly increased time  Transfers Overall transfer level: Modified independent Equipment used: None Transfers: Sit to/from Stand  General transfer comment: slightly increased time, single UE assisting to power up  Ambulation/Gait Ambulation/Gait assistance: Modified independent (Device/Increase time) Gait Distance (Feet): 120 Feet Assistive device: IV Pole Gait Pattern/deviations: Step-through pattern;Decreased stride length Gait velocity: slightly decreased   General Gait Details: mod I with IV pole using slightly slower, steady gait, aware of fatigue and limitations requesting to return to room for rest break, no loss of balance, able to continue conversation throughout ambulation  Stairs            Wheelchair Mobility    Modified Rankin (Stroke Patients Only)       Balance Overall balance assessment: Modified Independent          Pertinent Vitals/Pain Pain Assessment: No/denies pain    Home Living Family/patient expects to be discharged to:: Private residence Living Arrangements: Other (Comment) (brother and cousin) Available Help at Discharge: Family;Available 24 hours/day (cousin stays with pt during the day while pt's brother is at work) Type of Home: House Home Access: Stairs to enter Entrance Stairs-Rails: None (Back has more steps but rails on both sides) Entrance Stairs-Number of Steps: 4 Home Layout: One level Home Equipment: [place] - 2 wheels;Cane - single point  Prior Function Level of Independence: Needs assistance         Comments: Pt using SPC/RW around the home depending on fatigue level, progressively weaker since d/c home from hospital last  sesion. Cousin assisting with pants/socks/shoes dressing. 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)- but has not returned; no major deficits from [place] Hand: Left    Extremity/Trunk Assessment   Upper Extremity Assessment Upper Extremity Assessment: Defer to OT evaluation    Lower Extremity Assessment Lower Extremity Assessment: Overall WFL for tasks assessed (AROM WNL, strength 4/5)    Cervical / Trunk Assessment Cervical / Trunk Assessment: Normal  Communication   Communication: No difficulties  Cognition Arousal/Alertness: Awake/alert Behavior During Therapy: WFL for tasks assessed/performed Overall Cognitive Status: Within Functional Limits for tasks assessed       General Comments      Exercises     Assessment/Plan    PT Assessment Patent does not need any further PT services  PT Problem List Decreased activity tolerance       PT Treatment Interventions      PT Goals (Current goals can be found in the Care Plan section)  Acute Rehab PT Goals Patient Stated Goal: return home with family to assist PT Goal Formulation: With patient Time For Goal Achievement: 09/28/19 Potential to Achieve Goals: Good    Frequency     Barriers to discharge        Co-evaluation               AM-PAC PT "6 Clicks" Mobility  Outcome Measure Help needed turning from your back to your side while in a flat bed without using bedrails?: None Help needed moving from lying on your back to sitting on the side of a flat bed without using bedrails?: None Help needed moving to and from a bed to a chair (including a wheelchair)?: None Help needed standing up from a chair using your arms (e.g., wheelchair or bedside chair)?: None Help needed to walk in hospital room?: None Help needed climbing 3-5 steps with a railing? : A Little 6 Click Score: 23    End of Session   Activity Tolerance: Patient tolerated treatment well Patient  left: in chair;with call bell/phone within reach Nurse Communication: Mobility status PT Visit Diagnosis: Other abnormalities of gait and mobility (R26.89)    Time: 0981-1914 PT Time Calculation (min) (ACUTE ONLY): 24 min   Charges:   PT Evaluation $PT Eval Low Complexity: 1 Low          Tori Joselito Fieldhouse PT, DPT 09/21/19, 9:42 AM

## 2019-09-21 NOTE — Plan of Care (Signed)

## 2019-09-21 NOTE — Evaluation (Signed)
Occupational Therapy Evaluation Patient Details Name: Sandra Bonilla MRN: [id number] DOB: August 31, 1975 Today's Date: 09/21/2019    History of Present Illness 44 year old with a history of MS, HTN, and recent prolonged hospitalization due to diverticulitis with perforation and abscess May 2021.  She underwent an elective sigmoidectomy and was found to have an anastomotic leak on day 7 postop requiring a diverting ileostomy with drains placed in 3 distinct intra-abdominal abscesses.  She briefly required TPN but was able to be advanced to an oral diet before discharge.  She was followed up 6/8 in IR at which time CT noted resolution of her abdominal abscesses allowing 2 of her 3 drains to be removed.  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 She presented to the [facility] ED 6/18 with complaints of severe generalized weakness. She admitted that she had not felt well since the time of her discharge in May but that in the week prior to this presentation her symptoms had become significantly worse.  She noted shortness of breath with exertion and she was noted to be more tachycardic when participating with home PT. CT abdomen/pelvis in the ED noted a deep pelvic collection consistent with her known anastomotic leak which was unchanged from her fluoroscopic guided exam 6/16.   Clinical Impression   [name] is a 44 year old woman who presents with functional strength and endurance for self care tasks and short ambulation. Patient demonstrated ability to perform toileting, grooming and ambulating in hall holding onto IV pole. Patient reports needing assistance for lower body dressing and bathing due to abdominal incision/packing/tubing and feels like she is at her baseline. No OT needs at this time.    Follow Up Recommendations  No OT follow up    Equipment Recommendations       Recommendations for Other Services        Precautions / Restrictions Precautions Precautions: None Precaution Comments: 1 JP drain, ostomy Restrictions Weight Bearing Restrictions: No      Mobility Bed Mobility Overal bed mobility: Modified Independent             General bed mobility comments: slightly increased time  Transfers Overall transfer level: Modified independent Equipment used: None Transfers: Sit to/from Stand Sit to Stand: Modified independent (Device/Increase time) Stand pivot transfers: Supervision;Min guard       General transfer comment: slightly increased time, single UE assisting to power up. Ambulating in room and hall holding onto IV pole    Balance Overall balance assessment: No apparent balance deficits (not formally assessed)                                         ADL either performed or assessed with clinical judgement   ADL Overall ADL's : At baseline                                       General ADL Comments: Patient performed toileting and grooming tasks with modified independence. Reports needing assistance for lower body dressing and bathing due to abdominal incision/packing/tubing. Using IV pole to ambulate in room. Patient reports feeling at baseline without OT needs.     Vision Baseline Vision/History: No visual deficits Patient Visual Report: No change from baseline  Perception     Praxis      Pertinent Vitals/Pain Pain Assessment: No/denies pain     Hand Dominance Left   Extremity/Trunk Assessment Upper Extremity Assessment Upper Extremity Assessment: Overall WFL for tasks assessed   Lower Extremity Assessment Lower Extremity Assessment: Defer to PT evaluation   Cervical / Trunk Assessment Cervical / Trunk Assessment: Normal   Communication Communication Communication: No difficulties   Cognition Arousal/Alertness: Awake/alert Behavior During Therapy: WFL for tasks assessed/performed Overall Cognitive  Status: Within Functional Limits for tasks assessed                                     General Comments       Exercises     Shoulder Instructions      Home Living Family/patient expects to be discharged to:: Private residence Living Arrangements: Other (Comment) (brother and cousin) Available Help at Discharge: Family;Available 24 hours/day (cousin stays with pt during the day while pt's brother is at work) Type of Home: House Home Access: Stairs to enter Technical brewer of Steps: 4 Entrance Stairs-Rails: None (Back has more steps but rails on both sides) Home Layout: One level     Bathroom Shower/Tub: [place]: Environmental consultant - 2 wheels;Cane - single point          Prior Functioning/Environment Level of Independence: Needs assistance        Comments: Pt using SPC/RW around the home depending on fatigue level, progressively weaker since d/c home from hospital last sesion. Cousin assisting with pants/socks/shoes dressing. 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)- but has not returned; no major deficits from [place]        OT Problem List:        OT Treatment/Interventions:      OT Goals(Current goals can be found in the care plan section) Acute Rehab OT Goals Patient Stated Goal: return home with family to assist OT Goal Formulation: All assessment and education complete, DC therapy  OT Frequency:     Barriers to D/C:            Co-evaluation              AM-PAC OT "6 Clicks" Daily Activity     Outcome Measure Help from another person eating meals?: None Help from another person taking care of personal grooming?: None Help from another person toileting, which includes using toliet, bedpan, or urinal?: None Help from another person bathing (including washing, rinsing, drying)?: A Little Help from another person to put on and taking off regular upper body clothing?: None Help from another  person to put on and taking off regular lower body clothing?: A Little 6 Click Score: 22   End of Session Nurse Communication: Mobility status (okay to ambulate with nursing, family and by herself.)  Activity Tolerance: Patient tolerated treatment well Patient left: in chair;with call bell/phone within reach  OT Visit Diagnosis: Muscle weakness (generalized) (M62.81)                Time: 2992-4268 OT Time Calculation (min): 24 min Charges:  OT General Charges $OT Visit: 1 Visit OT Evaluation $OT Eval Low Complexity: 1 Low  Tyreon Frigon, OTR/L [place]  Office [phone] Pager: [place] 09/21/2019, 10:01 AM

## 2019-09-21 NOTE — Progress Notes (Signed)
Pt needs to be on imodium TID to prevent dehydration from ostomy output.  Recommend strict I&O's to monitor output while she's in the hospital.  Ileostomy output should be <1L/day.    Rosario Adie, MD  [facility]

## 2019-09-22 LAB — GLUCOSE, CAPILLARY
Glucose-Capillary: 80 mg/dL (ref 70–99)
Glucose-Capillary: 81 mg/dL (ref 70–99)
Glucose-Capillary: 84 mg/dL (ref 70–99)
Glucose-Capillary: 91 mg/dL (ref 70–99)

## 2019-09-22 NOTE — Progress Notes (Signed)
PROGRESS NOTE  Sandra Bonilla [id number]:[id number] DOB: 1975/04/03 DOA: 09/16/2019 PCP: [name], MD  Brief History   44 year old woman PMH multiple sclerosis, recent complicated diverticulitis requiring sigmoidectomy and diverting loop ileostomy and multiple pelvic drains, complicated by anastomotic leak and coloenteric fistula.  Admitted for generalized weakness secondary to poor oral intake secondary to complications from abdominal surgery.  A & P  Generalized weakness, failure to thrive, mild hypoglycemia secondary to complex GI illness. Secondary to very poor, minimal oral intake --Mild hypoglycemia last night was started back on D5.  Stopped today.  I again stressed to the patient she needs to eat and drink.  AKI, hyponatremia, hypokalemia secondary to poor oral intake --Acute kidney injury resolved.  Hyponatremia resolved.  Potassium within normal limits.  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  No further evaluation suggested.  Multiple sclerosis --Continue teriflunomide, Flexeril and gabapentin as needed  SARS-CoV-2 positive --Unexpected finding.  Chest x-ray no acute infiltrate.  No symptoms.  Previous Covid infection April 15, 2019 confirmed.  Additionally test this admission was consistent with prior covert infection.  Likely immunosuppressive therapy for multiple sclerosis is contributing to prolonged testing positivity.  Given confirmation of prior infection, lack of Covid symptoms, patient taken off precautions and is not considered an infectious  risk to others.  Disposition Plan:  Discussion: Failure to thrive continues.  I again stressed to the patient she needs to eat and drink and maintain hydration before discharge is safe.  Status is: Inpatient  Remains inpatient appropriate because:Inpatient level of care appropriate due to severity of illness  Dispo: The patient is from: Home              Anticipated d/c is to: Home HHPT, RN              Anticipated d/c date is: 1 day              Patient currently is not medically stable to d/c.  DVT prophylaxis:  SCDs Start: 09/16/19 2335   Code Status: Full Family Communication: none  Murray Hodgkins, MD  [facility] Direct contact: see [email or web address] (further directions at bottom of note if needed) 7PM-7AM contact night coverage as at bottom of note 09/22/2019, 4:55 PM  LOS: 5 days   Significant Hospital Events   .    Consults:  .    Procedures:  .   Significant Diagnostic Tests:  Marland Kitchen    Micro Data:  .    Antimicrobials:  .   Interval History/Subjective  Overall feels okay.  Still appetite is very poor, oral intake very poor, drinking little.  Objective   Vitals:  Vitals:   09/22/19 0543 09/22/19 1418  BP: 124/74 116/74  Pulse: 86 100  Resp: 16 17  Temp: (!) 97.5 F (36.4 C) 98.7 F (37.1 C)  SpO2: 97% 98%    Exam:  Constitutional.  Appears calm and comfortable. Cardiovascular.  Regular rate and rhythm.  No murmur, rub or gallop.  No lower extremity edema. Respiratory.  Clear to auscultation bilaterally.  No wheezes, rales or rhonchi.  Normal respiratory effort. Psychiatric.  Difficult to  assess mood and affect.  Speech is fluent and clear.  I have personally reviewed the following:   Today's Data  . Blood sugars stable for about 12 hours now in the 70s to 80s.  Mild hypoglycemia last evening at 67.  Scheduled Meds: . cholecalciferol  1,000 Units Oral Daily  . ferrous gluconate  324 mg Oral Q breakfast  . folic acid  1 mg Oral Daily  .  loperamide  2 mg Oral TID  . oxybutynin  5 mg Oral BID  . pantoprazole  40 mg Oral Daily  . polycarbophil  625 mg Oral TID  . potassium chloride  10 mEq Oral Daily  . Teriflunomide  14 mg Oral Daily  . 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  1. Check the care team in [facility] and look for a) attending/consulting TRH provider listed and b) the [facility] team listed 2. Log into [email or web address] and use 's universal password to access. If you do not have the password, please contact the hospital operator. 3. Locate the [facility] provider you are looking for under [facility] and page to a number that you can be directly reached. 4. If you still have difficulty reaching the provider, please page the [facility] (Director on Call) for the Hospitalists listed on amion for assistance.

## 2019-09-22 NOTE — Plan of Care (Signed)

## 2019-09-23 LAB — GLUCOSE, CAPILLARY
Glucose-Capillary: 68 mg/dL — ABNORMAL LOW (ref 70–99)
Glucose-Capillary: 80 mg/dL (ref 70–99)
Glucose-Capillary: 71 mg/dL (ref 70–99)

## 2019-09-23 LAB — BASIC METABOLIC PANEL
Anion gap: 8 (ref 5–15)
BUN: <5 mg/dL — ABNORMAL LOW (ref 6–20)
CO2: 27 mmol/L (ref 22–32)
Calcium: 8.1 mg/dL — ABNORMAL LOW (ref 8.9–10.3)
Chloride: 103 mmol/L (ref 98–111)
Creatinine, Ser: 0.71 mg/dL (ref 0.44–1.00)
GFR calc Af Amer: >60 mL/min (ref >60)
GFR calc non Af Amer: >60 mL/min (ref >60)
Glucose, Bld: 78 mg/dL (ref 70–99)
Potassium: 2.9 mmol/L — ABNORMAL LOW (ref 3.5–5.1)
Sodium: 138 mmol/L (ref 135–145)

## 2019-09-23 MED ORDER — MAGNESIUM SULFATE 2 GM/50ML IV SOLN
2.0000 g | Freq: Once | INTRAVENOUS | Status: AC
  Administered 2019-09-23: 2 g via INTRAVENOUS
  Filled 2019-09-23: qty 50

## 2019-09-23 MED ORDER — POTASSIUM CHLORIDE CRYS ER 20 MEQ PO TBCR
40.0000 meq | EXTENDED_RELEASE_TABLET | ORAL | Status: AC
  Administered 2019-09-23 (×2): 40 meq via ORAL
  Filled 2019-09-23 (×2): qty 2

## 2019-09-23 NOTE — TOC Progression Note (Signed)
Transition of Care [facility]) - Progression Note    Patient Details  Name: Sandra Bonilla MRN: [id number] Date of Birth: 03-Jul-1975  [facility] CM/SW Contact  Purcell Mouton, RN Phone Number: 09/23/2019, 2:49 PM  Clinical Narrative:     There is no change in discharge plan to home with [facility].   Expected Discharge Plan: Home/Self Care Barriers to Discharge: No Barriers Identified  Expected Discharge Plan and Services Expected Discharge Plan: Home/Self Care       Living arrangements for the past 2 months: Single Family Home Expected Discharge Date: 09/20/19                                     Social Determinants of Health (SDOH) Interventions    Readmission Risk Interventions No flowsheet data found.

## 2019-09-23 NOTE — Progress Notes (Signed)
PROGRESS NOTE  Sandra Bonilla CXK:[id number] DOB: December 01, 1975 DOA: 09/16/2019 PCP: [name], MD  Brief History   44 year old woman PMH multiple sclerosis, recent complicated diverticulitis requiring sigmoidectomy and diverting loop ileostomy and multiple pelvic drains, complicated by anastomotic leak and coloenteric fistula.  Admitted for generalized weakness secondary to poor oral intake secondary to complications from abdominal surgery.  A & P  Generalized weakness, failure to thrive, mild hypoglycemia secondary to complex GI illness. Secondary to very poor, minimal oral intake --Intake remains poor although did eat some dinner last night has not yet eaten today.  I again stressed the need for her to eat and drink especially fluids.  Mild hypoglycemia this morning has resolved.    AKI, hyponatremia, hypokalemia secondary to poor oral intake --Acute kidney injury resolved.  Hyponatremia resolved.  --Replete potassium.  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 Last 24 hours_ 635.  Transaminitis unclear etiology --No findings on liver on CT abdomen --Improved with volume resuscitation.  No further evaluation suggested.  Multiple sclerosis --Continue teriflunomide, Flexeril and gabapentin as needed  SARS-CoV-2 positive --Unexpected finding.  Chest x-ray no acute infiltrate.  No symptoms.  Previous Covid infection April 15, 2019 confirmed.  Additionally test this admission was consistent with prior covert infection.  Likely immunosuppressive therapy for multiple sclerosis is contributing to prolonged testing positivity.  Given confirmation of prior infection, lack  of Covid symptoms, patient taken off precautions and is not considered an infectious risk to others.  Disposition Plan:  Discussion: Poor oral intake continues but perhaps a little bit better.  Again stressed need to eat and drink.  Reevaluate in the morning.  May be able to go home tomorrow if adequate oral intake.  Status is: Inpatient  Remains inpatient appropriate because:Inpatient level of care appropriate due to severity of illness  Dispo: The patient is from: Home              Anticipated d/c is to: Home HHPT, RN              Anticipated d/c date is: 1 day              Patient currently is not medically stable to d/c.  DVT prophylaxis:  SCDs Start: 09/16/19 2335   Code Status: Full Family Communication: none  Murray Hodgkins, MD  [facility] Direct contact: see [email or web address] (further directions at bottom of note if needed) 7PM-7AM contact night coverage as at bottom of note 09/23/2019, 4:49 PM  LOS: 6 days    Interval History/Subjective  Feels okay.  Still not thirsty or hungry but did eat a corn dog yesterday and some other food for dinner.  Has not eaten yet today.  Drinking some fluids.  Objective   Vitals:  Vitals:   09/23/19 0520 09/23/19 1232  BP: 112/71 106/68  Pulse: 95 86  Resp: 17 18  Temp: 97.7 F (36.5 C) 97.8 F (36.6 C)  SpO2: 96% 98%    Exam:  Constitutional.  Appears calm, comfortable. Respiratory.  Clear to auscultation bilaterally.  No wheezes, rales or rhonchi.  Normal respiratory effort.  Cardiovascular.   Regular rate and rhythm.  No murmur, rub or gallop. Psychiatric.  Grossly normal mood and  affect.  Speech fluent and appropriate.  I have personally reviewed the following:   Today's Data  . Borderline hypoglycemia this morning at 68. Marland Kitchen Potassium 2.9, remainder BMP unremarkable  Scheduled Meds: . cholecalciferol  1,000 Units Oral Daily  . ferrous gluconate  324 mg Oral Q breakfast  . folic acid  1 mg Oral Daily  . loperamide   2 mg Oral TID  . oxybutynin  5 mg Oral BID  . pantoprazole  40 mg Oral Daily  . polycarbophil  625 mg Oral TID  . potassium chloride  40 mEq Oral Q4H  . Teriflunomide  14 mg Oral Daily  . 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  1. Check the care team in [facility] and look for a) attending/consulting TRH provider listed and b) the [facility] team listed 2. Log into [email or web address] and use [place] universal password to access. If you do not have the password, please contact the hospital operator. 3. Locate the [facility] provider you are looking for under [facility] and page to a number that you can be directly reached. 4. If you still have difficulty reaching the provider, please page the [facility] (Director on Call) for the Hospitalists listed on amion for assistance.

## 2019-09-24 LAB — BASIC METABOLIC PANEL
Anion gap: 11 (ref 5–15)
BUN: <5 mg/dL — ABNORMAL LOW (ref 6–20)
CO2: 25 mmol/L (ref 22–32)
Calcium: 8.1 mg/dL — ABNORMAL LOW (ref 8.9–10.3)
Chloride: 100 mmol/L (ref 98–111)
Creatinine, Ser: 0.69 mg/dL (ref 0.44–1.00)
GFR calc Af Amer: >60 mL/min (ref >60)
GFR calc non Af Amer: >60 mL/min (ref >60)
Glucose, Bld: 76 mg/dL (ref 70–99)
Potassium: 3.5 mmol/L (ref 3.5–5.1)
Sodium: 136 mmol/L (ref 135–145)

## 2019-09-24 LAB — GLUCOSE, CAPILLARY
Glucose-Capillary: 74 mg/dL (ref 70–99)
Glucose-Capillary: 75 mg/dL (ref 70–99)

## 2019-09-24 LAB — MAGNESIUM: Magnesium: 2.1 mg/dL (ref 1.7–2.4)

## 2019-09-24 NOTE — Discharge Summary (Signed)
Physician Discharge Summary  Sandra Bonilla ZOX:[id number] DOB: 20-Feb-1976 DOA: 09/16/2019  PCP: [name], MD  Admit date: 09/16/2019 Discharge date: 09/24/2019  Recommendations for Outpatient Follow-up:  1. Follow-up failure to thrive, anastomotic leak.   Follow-up Information    [name], MD. Schedule an appointment as soon as possible for a visit in 1 week(s).   Specialty: Internal Medicine Contact information: [place] [address] [phone]        [name], MD. Schedule an appointment as soon as possible for a visit in 2 week(s).   Specialty: General Surgery Contact information: [place] [address] [phone]                Discharge Diagnoses: Principal diagnosis is #1 1. Generalized weakness, failure to thrive, mild hypoglycemia secondary to complex GI illness. 2. AKI, hyponatremia, hypokalemia secondary to poor oral intake 3. PMH 5 weeks status post robotic sigmoid colectomy for diverticulitis complicated by anastomotic leak 4. Transaminitis unclear etiology 5. Multiple sclerosis 6. SARS-CoV-2 positive  Discharge Condition: improved Disposition: home with HHRN, PT  Diet recommendation: Regular  Filed Weights   09/17/19 0252  Weight: 90.7 kg    History of present illness:  44 year old woman PMH multiple sclerosis, recent complicated diverticulitis requiring sigmoidectomy and diverting loop ileostomy and multiple pelvic drains, complicated by anastomotic leak and coloenteric fistula.  Admitted for generalized weakness secondary to poor oral intake secondary to complications from abdominal surgery.  Hospital Course:  Patient was treated for failure to thrive, mild hypoglycemia secondary to poor oral intake, treated with IV fluids for AKI.  Symptoms were slow to improve.  Hospitalization was prolonged by failure to thrive and poor oral intake.  However condition gradually improved  and she is tolerating a diet and drinking fluids and stable for discharge now.  Individual issues as below.  Generalized weakness, failure to thrive, mild hypoglycemia secondary to complex GI illness. Secondary to very poor, minimal oral intake --Improved.  No hypoglycemia last 24 hours.  Oral intake improved.  AKI, hyponatremia, hypokalemia secondary to poor oral intake --Acute kidney injury resolved.  Hyponatremia resolved.  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  Ostomy output has been less than 1 L.    Transaminitis unclear etiology --No findings on liver on CT abdomen --Improved with volume resuscitation.  No further evaluation suggested.  Multiple sclerosis --Continue teriflunomide, Flexeril and gabapentin as needed  SARS-CoV-2 positive --Unexpected finding.  Chest x-ray no acute infiltrate.  No symptoms.  Previous Covid infection April 15, 2019 confirmed.  Additionally test this admission was consistent with prior covert infection.  Likely immunosuppressive therapy for multiple sclerosis is contributing to prolonged testing positivity.  Given confirmation of prior infection, lack of Covid symptoms, patient taken off precautions and is not considered an infectious risk to others.  Today's assessment: S: Feels okay.  Drink more fluids yesterday.  Was able to eat some chicken noodle soup yesterday.  No significant abdominal pain. O: Vitals:  Vitals:   09/23/19 2142 09/24/19 0533  BP: 113/74 101/69  Pulse: 82 86  Resp: 18 16  Temp: 97.8 F (36.6 C) 97.7 F (36.5 C)  SpO2: 98% 91%    Constitutional:  . Appears  calm and comfortable Respiratory:  . CTA bilaterally, no  w/r/r.  . Respiratory effort normal.  Cardiovascular:  . RRR, no m/r/g . No LE extremity edema   Abdomen:  . Soft Psychiatric:  . Mental status o Mood, affect appropriate  Ileostomy output 200 Oral intake 360.  Discharge Instructions  Discharge Instructions    Call MD for:  persistant nausea and vomiting   Complete by: As directed    Call MD for:  redness, tenderness, or signs of infection (pain, swelling, redness, odor or green/yellow discharge around incision site)   Complete by: As directed    Call MD for:  severe uncontrolled pain   Complete by: As directed    Diet - low sodium heart healthy   Complete by: As directed    Discharge instructions   Complete by: As directed    Call your physician or seek immediate medical attention for poor oral intake, increased abdominal pain, vomiting, weight loss, ostomy output greater than 1 liter per day or worsening of condition.   Discharge wound care:   Complete by: As directed    As per your surgeon   Increase activity slowly   Complete by: As directed      Allergies as of 09/24/2019      Reactions   Sulfamethoxazole-trimethoprim Rash, Hives   Sulfa Antibiotics Other (See Comments)      Medication List    STOP taking these medications   chlorthalidone 25 MG tablet Commonly known as: HYGROTON     TAKE these medications   acetaminophen 325 MG tablet Commonly known as: TYLENOL Take 2 tablets (650 mg total) by mouth every 6 (six) hours as needed for mild pain (or Fever >/= 101).   Aubagio 14 MG Tabs Generic drug: Teriflunomide TAKE 1 TABLET BY MOUTH  DAILY What changed: how much to take   cholecalciferol 25 MCG (1000 UNIT) tablet Commonly known as: VITAMIN D3 Take 1,000 Units by mouth daily.   cyclobenzaprine 5 MG tablet Commonly known as: FLEXERIL Take 1 tablet (5 mg total) by mouth at bedtime as needed. What changed: reasons to take this   ferrous gluconate 324 MG tablet Commonly known as: FERGON TAKE 1  TABLET(324 MG) BY MOUTH DAILY WITH BREAKFAST What changed: See the new instructions.   folic acid 1 MG tablet Commonly known as: FOLVITE TAKE 1 TABLET(1 MG) BY MOUTH DAILY What changed: See the new instructions.   gabapentin 100 MG capsule Commonly known as: NEURONTIN Take 100 mg by mouth daily as needed (nerve pain).   levonorgestrel 20 MCG/24HR IUD Commonly known as: MIRENA 1 each by Intrauterine route once.   loperamide 2 MG capsule Commonly known as: IMODIUM Take 1 capsule (2 mg total) by mouth 3 (three) times daily before meals.   LORazepam 1 MG tablet Commonly known as: ATIVAN Take 1 mg by mouth 2 (two) times daily as needed for anxiety.   meloxicam 15 MG tablet Commonly known as: MOBIC Take 1 tablet (15 mg total) by mouth daily. What changed:   when to take this  reasons to take this   omeprazole 40 MG capsule Commonly known as: PRILOSEC TAKE 1 CAPSULE(40 MG) BY MOUTH DAILY BEFORE BREAKFAST What changed: See the new instructions.   ondansetron 4 MG disintegrating tablet Commonly known as: ZOFRAN-ODT Take 4 mg by mouth every 6 (six) hours as needed for nausea or vomiting.   oxybutynin 5 MG tablet Commonly known as: DITROPAN TAKE 1 TABLET(5 MG) BY MOUTH TWICE DAILY What  changed:   how much to take  how to take this  when to take this  additional instructions   oxyCODONE 5 MG immediate release tablet Commonly known as: Oxy IR/ROXICODONE Take 1-2 tablets (5-10 mg total) by mouth every 6 (six) hours as needed for moderate pain or severe pain.   polycarbophil 625 MG tablet Commonly known as: FIBERCON Take 1 tablet (625 mg total) by mouth 3 (three) times daily.   potassium chloride 10 MEQ CR capsule Commonly known as: MICRO-K Take 10 mEq by mouth every morning.   PROBIOTIC PO Take 1 capsule by mouth daily.   REFRESH OP Place 2 drops into both eyes 4 (four) times daily as needed (dryness).   Trelegy Ellipta 100-62.5-25 MCG/INH Aepb Generic  drug: Fluticasone-Umeclidin-Vilant Inhale 1 puff into the lungs daily.            Discharge Care Instructions  (From admission, onward)         Start     Ordered   09/24/19 0000  Discharge wound care:       Comments: As per your surgeon   09/24/19 0905         Allergies  Allergen Reactions  . Sulfamethoxazole-Trimethoprim Rash and Hives  . Sulfa Antibiotics Other (See Comments)    The results of significant diagnostics from this hospitalization (including imaging, microbiology, ancillary and laboratory) are listed below for reference.    Significant Diagnostic Studies: CT ABDOMEN PELVIS W CONTRAST  Result Date: 09/16/2019 CLINICAL DATA:  Abdominal distension recent surgery pain EXAM: CT ABDOMEN AND PELVIS WITH CONTRAST TECHNIQUE: Multidetector CT imaging of the abdomen and pelvis was performed using the standard protocol following bolus administration of intravenous contrast. CONTRAST:  168mL OMNIPAQUE IOHEXOL 300 MG/ML  SOLN COMPARISON:  September 06, 2018 FINDINGS: Lower chest: The visualized heart size within normal limits. No pericardial fluid/thickening. No hiatal hernia. Streaky atelectasis seen at the right base Hepatobiliary: The liver is normal in density without focal abnormality.The main portal vein is patent. The patient is status post cholecystectomy. No biliary ductal dilation. Pancreas: Unremarkable. No pancreatic ductal dilatation or surrounding inflammatory changes. Spleen: Normal in size without focal abnormality. Adrenals/Urinary Tract: Both adrenal glands appear normal. The kidneys and collecting system appear normal without evidence of urinary tract calculus or hydronephrosis. Bladder is unremarkable. Stomach/Bowel: The stomach is normal appearance. Again noted is a right lower quadrant diverting. No dilatation is seen. Prior sigmoidectomy is noted with a colonic anastomosis. There appears to be mild wall thickening the anastomotic site on prior exam. Again noted is a  right-sided pigtail catheter extending into the deep pelvis within a collection containing contrast measuring approximately 2.5 x 3.1 cm and the collection with a small amount of contrast from the collection into a small bowel loop anteriorly, series 3, image 76. Vascular/Lymphatic: There are no enlarged mesenteric, retroperitoneal, or pelvic lymph nodes. No significant vascular findings are present. Reproductive:  The uterus and adnexa are unremarkable. Other: As described above Musculoskeletal: No acute or significant osseous findings. IMPRESSION: 1. Postsurgical changes with a diverting ileostomy and a sigmoid colonic anastomosis. There appears to be mild wall thickening with adjacent to the anastomotic site as on the prior exam. 2. Pigtail catheter within a deep pelvic collection which contains contrast and a small amount of contrast extending into the adjacent small bowel loops, consistent with the coloenteric fistula as on recent fluoroscopic guided exam on September 14, 2019 Electronically Signed   By: Prudencio Pair M.D.   On: 09/16/2019  22:22   DG Chest Port 1 View  Result Date: 09/17/2019 CLINICAL DATA:  Weakness and fatigue.  Pneumonia due to COVID-19. EXAM: PORTABLE CHEST 1 VIEW COMPARISON:  None. FINDINGS: Lungs are hypoinflated and otherwise clear. Cardiomediastinal silhouette and remainder of the exam is unremarkable. IMPRESSION: No active disease. Electronically Signed   By: Marin Olp M.D.   On: 09/17/2019 10:46   Microbiology: Recent Results (from the past 240 hour(s))  SARS Coronavirus 2 by RT PCR (hospital order, performed in [facility] hospital lab) Nasopharyngeal Nasopharyngeal Swab     Status: Abnormal   Collection Time: 09/16/19 11:43 PM   Specimen: Nasopharyngeal Swab  Result Value Ref Range Status   SARS Coronavirus 2 POSITIVE (A) NEGATIVE Final    Comment: CRITICAL RESULT CALLED TO, READ BACK BY AND VERIFIED WITH: Ronnell Gu[place] @ 7510 09/17/2019 PERRY, J. (NOTE) SARS-CoV-2  target nucleic acids are DETECTED  SARS-CoV-2 RNA is generally detectable in upper respiratory specimens  during the acute phase of infection.  Positive results are indicative  of the presence of the identified virus, but do not rule out bacterial infection or co-infection with other pathogens not detected by the test.  Clinical correlation with patient history and  other diagnostic information is necessary to determine patient infection status.  The expected result is negative.  Fact Sheet for Patients:   StrictlyIdeas.no   Fact Sheet for Healthcare Providers:   BankingDealers.co.za    This test is not yet approved or cleared by the [place] FDA and  has been authorized for detection and/or diagnosis of SARS-CoV-2 by FDA under an Emergency Use Authorization (EUA).  This EUA will remain in effect  (meaning this test can be used) for the duration of  the COVID-19 declaration under Section 564(b)(1) of the Act, 21 U.S.C. section 360-bbb-3(b)(1), unless the authorization is terminated or revoked sooner.  Performed at [facility], [place] [address] [place], [place] [address]      Labs: Basic Metabolic Panel: Recent Labs  Lab 09/18/19 0449 09/18/19 0449 09/19/19 0439 09/20/19 0419 09/21/19 0426 09/23/19 0752 09/24/19 0428  NA 140   < > 136 138 138 138 136  K 3.1*   < > 3.5 3.5 3.5 2.9* 3.5  CL 106   < > 102 103 105 103 100  CO2 21*   < > 24 23 22 27 25   GLUCOSE 71   < > 65* 63* 59* 78 76  BUN 7   < > <5* <5* <5* <5* <5*  CREATININE 0.55   < > 0.61 0.65 0.59 0.71 0.69  CALCIUM 8.0*   < > 7.8* 8.0* 7.8* 8.1* 8.1*  MG 1.6*  --  2.1 1.6* 1.7  --  2.1   < > = values in this interval not displayed.   Liver Function Tests: Recent Labs  Lab 09/18/19 0449 09/19/19 0439 09/20/19 0419  AST 28 30 26   ALT 35 33 29  ALKPHOS 133* 131* 129*  BILITOT 1.2 1.2 1.3*  PROT 6.1* 6.3* 6.2*  ALBUMIN 2.9* 2.9* 2.7*     CBC: Recent Labs  Lab 09/18/19 0449 09/19/19 0439 09/20/19 0419  WBC 5.6 5.6 5.2  HGB 10.0* 10.2* 10.6*  HCT 32.5* 33.4* 34.2*  MCV 84.6 85.0 84.2  PLT 215 241 224   CBG: Recent Labs  Lab 09/22/19 2109 09/23/19 0746 09/23/19 1111 09/23/19 1608 09/24/19 0731  GLUCAP 91 68* 80 71 74    Principal Problem:   Weakness Active Problems:  Multiple sclerosis ([place])   Essential hypertension   Ileostomy in place [facility])   AKI (acute kidney injury) ([place])   Transaminitis   Hypovolemia   Time coordinating discharge: 25 minutes  Signed:  Murray Hodgkins, MD  [facility]  09/24/2019, 9:06 AM

## 2019-10-09 ENCOUNTER — Other Ambulatory Visit: Payer: Self-pay | Admitting: Gastroenterology

## 2019-10-17 ENCOUNTER — Other Ambulatory Visit: Payer: Self-pay | Admitting: General Surgery

## 2019-10-17 DIAGNOSIS — K5732 Diverticulitis of large intestine without perforation or abscess without bleeding: Secondary | ICD-10-CM

## 2019-10-20 ENCOUNTER — Other Ambulatory Visit: Payer: Self-pay

## 2019-10-20 ENCOUNTER — Ambulatory Visit: Payer: BC Managed Care – PPO | Admitting: Dermatology

## 2019-10-20 ENCOUNTER — Ambulatory Visit (HOSPITAL_COMMUNITY)
Admission: RE | Admit: 2019-10-20 | Discharge: 2019-10-20 | Disposition: A | Discharge status: Home / Self Care | Payer: BC Managed Care – PPO | Source: Ambulatory Visit | Attending: General Surgery | Admitting: General Surgery

## 2019-10-20 DIAGNOSIS — K5732 Diverticulitis of large intestine without perforation or abscess without bleeding: Secondary | ICD-10-CM | POA: Insufficient documentation

## 2019-10-20 MED ORDER — IOHEXOL 300 MG/ML  SOLN
100.0000 mL | Freq: Once | INTRAMUSCULAR | Status: AC | PRN
  Administered 2019-10-20: 100 mL via INTRAVENOUS

## 2019-10-20 MED ORDER — SODIUM CHLORIDE (PF) 0.9 % IJ SOLN
INTRAMUSCULAR | Status: AC
  Filled 2019-10-20: qty 50

## 2019-11-03 ENCOUNTER — Telehealth: Payer: Self-pay | Admitting: Gastroenterology

## 2019-11-03 NOTE — Telephone Encounter (Signed)
Pt needs a 90 day prescription for her folic acid in order for her insurance to cover it

## 2019-11-04 MED ORDER — FOLIC ACID 1 MG PO TABS: ORAL_TABLET | ORAL | 1 refills | Status: DC

## 2019-11-04 NOTE — Telephone Encounter (Signed)
90 day supply of folic acid sent to [facility]. Left msg on pt's voicemail with this information.

## 2019-11-05 ENCOUNTER — Other Ambulatory Visit: Payer: Self-pay | Admitting: Gastroenterology

## 2019-11-08 ENCOUNTER — Telehealth: Payer: Self-pay

## 2019-11-08 ENCOUNTER — Other Ambulatory Visit: Payer: Self-pay | Admitting: Gastroenterology

## 2019-11-08 MED ORDER — OMEPRAZOLE 40 MG PO CPDR: DELAYED_RELEASE_CAPSULE | ORAL | 2 refills | Status: DC

## 2019-11-08 NOTE — Telephone Encounter (Signed)
Refill of  -Omeprazole sent to pharmacy.

## 2019-11-11 ENCOUNTER — Other Ambulatory Visit: Payer: Self-pay | Admitting: Gastroenterology

## 2019-11-28 ENCOUNTER — Telehealth: Payer: Self-pay | Admitting: Gastroenterology

## 2019-11-28 NOTE — Telephone Encounter (Signed)
I recommend you have a repeat colonoscopy in 6 months, to determine if you have developed any recurrent polyp tissue in the rectum.

## 2019-11-30 ENCOUNTER — Ambulatory Visit: Payer: Self-pay | Admitting: Family Medicine

## 2019-12-01 ENCOUNTER — Other Ambulatory Visit: Payer: Self-pay

## 2019-12-01 DIAGNOSIS — D128 Benign neoplasm of rectum: Secondary | ICD-10-CM

## 2019-12-01 MED ORDER — PEG 3350-KCL-NA BICARB-NACL 420 G PO SOLR
4000.0000 mL | Freq: Once | ORAL | 0 refills | Status: AC
Start: 2019-12-01 — End: 2019-12-01

## 2019-12-01 NOTE — Telephone Encounter (Signed)
The pt has been sent all information to My Chart.  Prescription has been sent to the pharmacy.  COVID appt sent to the pt as well.

## 2019-12-06 ENCOUNTER — Encounter (HOSPITAL_COMMUNITY): Payer: Self-pay | Admitting: Obstetrics and Gynecology

## 2019-12-06 ENCOUNTER — Other Ambulatory Visit: Payer: Self-pay

## 2019-12-06 ENCOUNTER — Emergency Department (HOSPITAL_COMMUNITY)
Admission: EM | Admit: 2019-12-06 | Discharge: 2019-12-06 | Disposition: A | Discharge status: Home / Self Care | Payer: BC Managed Care – PPO | Attending: Emergency Medicine | Admitting: Emergency Medicine

## 2019-12-06 DIAGNOSIS — Z5321 Procedure and treatment not carried out due to patient leaving prior to being seen by health care provider: Secondary | ICD-10-CM | POA: Diagnosis not present

## 2019-12-06 DIAGNOSIS — R7989 Other specified abnormal findings of blood chemistry: Secondary | ICD-10-CM | POA: Diagnosis present

## 2019-12-06 LAB — CBC WITH DIFFERENTIAL/PLATELET
Abs Immature Granulocytes: 0.02 10*3/uL (ref 0.00–0.07)
Basophils Absolute: 0.1 10*3/uL (ref 0.0–0.1)
Basophils Relative: 1 %
Eosinophils Absolute: 0.1 10*3/uL (ref 0.0–0.5)
Eosinophils Relative: 1 %
HCT: 36.5 % (ref 36.0–46.0)
Hemoglobin: 11.7 g/dL — ABNORMAL LOW (ref 12.0–15.0)
Immature Granulocytes: 0 %
Lymphocytes Relative: 27 %
Lymphs Abs: 2 10*3/uL (ref 0.7–4.0)
MCH: 26.2 pg (ref 26.0–34.0)
MCHC: 32.1 g/dL (ref 30.0–36.0)
MCV: 81.7 fL (ref 80.0–100.0)
Monocytes Absolute: 0.8 10*3/uL (ref 0.1–1.0)
Monocytes Relative: 11 %
Neutro Abs: 4.3 10*3/uL (ref 1.7–7.7)
Neutrophils Relative %: 60 %
Platelets: 255 10*3/uL (ref 150–400)
RBC: 4.47 MIL/uL (ref 3.87–5.11)
RDW: 15.9 % — ABNORMAL HIGH (ref 11.5–15.5)
WBC: 7.3 10*3/uL (ref 4.0–10.5)
nRBC: 0 % (ref 0.0–0.2)

## 2019-12-06 LAB — COMPREHENSIVE METABOLIC PANEL
ALT: 19 U/L (ref 0–44)
AST: 26 U/L (ref 15–41)
Albumin: 4.1 g/dL (ref 3.5–5.0)
Alkaline Phosphatase: 83 U/L (ref 38–126)
Anion gap: 13 (ref 5–15)
BUN: 20 mg/dL (ref 6–20)
CO2: 24 mmol/L (ref 22–32)
Calcium: 9.8 mg/dL (ref 8.9–10.3)
Chloride: 101 mmol/L (ref 98–111)
Creatinine, Ser: 1.56 mg/dL — ABNORMAL HIGH (ref 0.44–1.00)
GFR calc Af Amer: 46 mL/min — ABNORMAL LOW (ref >60)
GFR calc non Af Amer: 39 mL/min — ABNORMAL LOW (ref >60)
Glucose, Bld: 110 mg/dL — ABNORMAL HIGH (ref 70–99)
Potassium: 3.9 mmol/L (ref 3.5–5.1)
Sodium: 138 mmol/L (ref 135–145)
Total Bilirubin: 1.7 mg/dL — ABNORMAL HIGH (ref 0.3–1.2)
Total Protein: 7.3 g/dL (ref 6.5–8.1)

## 2019-12-06 NOTE — ED Triage Notes (Signed)
Patient presents to the ER for elevated creatinine. Patient reports her creatinine was greater than 2 and she was told by her PCP to come to the ER.

## 2020-01-05 ENCOUNTER — Other Ambulatory Visit: Payer: Self-pay

## 2020-01-05 ENCOUNTER — Ambulatory Visit (INDEPENDENT_AMBULATORY_CARE_PROVIDER_SITE_OTHER): Payer: BC Managed Care – PPO | Admitting: Dermatology

## 2020-01-05 ENCOUNTER — Other Ambulatory Visit: Payer: Self-pay | Admitting: Dermatology

## 2020-01-05 DIAGNOSIS — D485 Neoplasm of uncertain behavior of skin: Secondary | ICD-10-CM | POA: Diagnosis not present

## 2020-01-05 DIAGNOSIS — L814 Other melanin hyperpigmentation: Secondary | ICD-10-CM

## 2020-01-05 DIAGNOSIS — Z86018 Personal history of other benign neoplasm: Secondary | ICD-10-CM | POA: Diagnosis not present

## 2020-01-05 DIAGNOSIS — K5792 Diverticulitis of intestine, part unspecified, without perforation or abscess without bleeding: Secondary | ICD-10-CM | POA: Diagnosis not present

## 2020-01-05 DIAGNOSIS — L719 Rosacea, unspecified: Secondary | ICD-10-CM

## 2020-01-05 DIAGNOSIS — L578 Other skin changes due to chronic exposure to nonionizing radiation: Secondary | ICD-10-CM

## 2020-01-05 DIAGNOSIS — Z1283 Encounter for screening for malignant neoplasm of skin: Secondary | ICD-10-CM | POA: Diagnosis not present

## 2020-01-05 DIAGNOSIS — Z9889 Other specified postprocedural states: Secondary | ICD-10-CM

## 2020-01-05 DIAGNOSIS — D1800 Hemangioma unspecified site: Secondary | ICD-10-CM

## 2020-01-05 DIAGNOSIS — L821 Other seborrheic keratosis: Secondary | ICD-10-CM

## 2020-01-05 DIAGNOSIS — D229 Melanocytic nevi, unspecified: Secondary | ICD-10-CM

## 2020-01-05 NOTE — Progress Notes (Signed)
Follow-Up Visit   Subjective  Sandra Bonilla is a 44 y.o. female who presents for the following: Annual Exam (History of dysplastic nevi - TBSE today). The patient presents for Total-Body Skin Exam (TBSE) for skin cancer screening and mole check.  The following portions of the chart were reviewed this encounter and updated as appropriate:  Tobacco  Allergies  Meds  Problems  Med Hx  Surg Hx  Fam Hx      Review of Systems:  No other skin or systemic complaints except as noted in HPI or Assessment and Plan.  Objective  Well appearing patient in no apparent distress; mood and affect are within normal limits.  A full examination was performed including scalp, head, eyes, ears, nose, lips, neck, chest, axillae, abdomen, back, buttocks, bilateral upper extremities, bilateral lower extremities, hands, feet, fingers, toes, fingernails, and toenails. All findings within normal limits unless otherwise noted below.  Objective  Right medial knee: 0.6 cm irregular brown macule  Objective  Left Upper Back 3.0 cm lateral to spine: 0.4 cm irregular brown macule  Objective  Left mid to low back 5.0 cm lat to spine: 1.1 x 0.5 cm irregular brown macule   Assessment & Plan    Lentigines - Scattered tan macules - Discussed due to sun exposure - Benign, observe - Call for any changes  Seborrheic Keratoses - Stuck-on, waxy, tan-brown papules and plaques  - Discussed benign etiology and prognosis. - Observe - Call for any changes  Melanocytic Nevi - Tan-brown and/or pink-flesh-colored symmetric macules and papules - Benign appearing on exam today - Observation - Call clinic for new or changing moles - Recommend daily use of broad spectrum spf 30+ sunscreen to sun-exposed areas.   Hemangiomas - Red papules - Discussed benign nature - Observe - Call for any changes  Actinic Damage - diffuse scaly erythematous macules with underlying dyspigmentation - Recommend daily  broad spectrum sunscreen SPF 30+ to sun-exposed areas, reapply every 2 hours as needed.  - Call for new or changing lesions.  Skin cancer screening performed today.  History of Dysplastic Nevi - No evidence of recurrence today - Recommend regular full body skin exams - Recommend daily broad spectrum sunscreen SPF 30+ to sun-exposed areas, reapply every 2 hours as needed.  - Call if any new or changing lesions are noted between office visits  Diverticulitis Right Abdomen (side) - Lower Status post surgery with ostomy bag  Neoplasm of uncertain behavior of skin (3) Right medial knee  Epidermal / dermal shaving  Lesion diameter (cm):  0.6 Informed consent: discussed and consent obtained   Timeout: patient name, date of birth, surgical site, and procedure verified   Procedure prep:  Patient was prepped and draped in usual sterile fashion Prep type:  Isopropyl alcohol Anesthesia: the lesion was anesthetized in a standard fashion   Anesthetic:  1% lidocaine w/ epinephrine 1-100,000 buffered w/ 8.4% NaHCO3 Instrument used: flexible razor blade   Hemostasis achieved with: pressure, aluminum chloride and electrodesiccation   Outcome: patient tolerated procedure well   Post-procedure details: sterile dressing applied and wound care instructions given   Dressing type: bandage and petrolatum    Left Upper Back 3.0 cm lateral to spine  Epidermal / dermal shaving  Lesion diameter (cm):  0.4 Informed consent: discussed and consent obtained   Timeout: patient name, date of birth, surgical site, and procedure verified   Procedure prep:  Patient was prepped and draped in usual sterile fashion Prep type:  Isopropyl  alcohol Anesthesia: the lesion was anesthetized in a standard fashion   Anesthetic:  1% lidocaine w/ epinephrine 1-100,000 buffered w/ 8.4% NaHCO3 Instrument used: flexible razor blade   Hemostasis achieved with: pressure, aluminum chloride and electrodesiccation   Outcome:  patient tolerated procedure well   Post-procedure details: sterile dressing applied and wound care instructions given   Dressing type: bandage and petrolatum    Left mid to low back 5.0 cm lat to spine  Epidermal / dermal shaving  Lesion diameter (cm):  1.1 Informed consent: discussed and consent obtained   Timeout: patient name, date of birth, surgical site, and procedure verified   Procedure prep:  Patient was prepped and draped in usual sterile fashion Prep type:  Isopropyl alcohol Anesthesia: the lesion was anesthetized in a standard fashion   Anesthetic:  1% lidocaine w/ epinephrine 1-100,000 buffered w/ 8.4% NaHCO3 Instrument used: flexible razor blade   Hemostasis achieved with: pressure, aluminum chloride and electrodesiccation   Outcome: patient tolerated procedure well   Post-procedure details: sterile dressing applied and wound care instructions given   Dressing type: bandage and petrolatum    Other Related Procedures Anatomic Pathology Report  Rosacea Head - Anterior (Face)  Start [facility] metronidazole/ivermectin/azelaic acid twice daily as needed to affected areas on the face. The patient was advised this is not covered by insurance. They will receive an email to check out and the medication will be mailed to their home.    Return in about 6 months (around 07/05/2020).   I, Ashok Cordia, CMA, am acting as scribe for Sarina Ser, MD .  Documentation: I have reviewed the above documentation for accuracy and completeness, and I agree with the above.  Sarina Ser, MD

## 2020-01-05 NOTE — Patient Instructions (Signed)
Wound Care Instructions  1. Cleanse wound gently with soap and water once a day then pat dry with clean gauze. Apply a thing coat of Petrolatum (petroleum jelly, "Vaseline") over the wound (unless you have an allergy to this). We recommend that you use a new, sterile tube of Vaseline. Do not pick or remove scabs. Do not remove the yellow or white "healing tissue" from the base of the wound.  2. Cover the wound with fresh, clean, nonstick gauze and secure with paper tape. You may use Band-Aids in place of gauze and tape if the would is small enough, but would recommend trimming much of the tape off as there is often too much. Sometimes Band-Aids can irritate the skin.  3. You should call the office for your biopsy report after 1 week if you have not already been contacted.  4. If you experience any problems, such as abnormal amounts of bleeding, swelling, significant bruising, significant pain, or evidence of infection, please call the office immediately.  5. FOR ADULT SURGERY PATIENTS: If you need something for pain relief you may take 1 extra strength Tylenol (acetaminophen) AND 2 Ibuprofen (200mg each) together every 4 hours as needed for pain. (do not take these if you are allergic to them or if you have a reason you should not take them.) Typically, you may only need pain medication for 1 to 3 days.   Instructions for [facility] Medications  One or more of your medications was sent to the [facility] mail order compounding pharmacy. You will receive an email from them and can purchase the medicine through that link. It will then be mailed to your home at the address you confirmed. If for any reason you do not receive an email from them, please check your spam folder. If you still do not find the email, please let us know. [facility] phone number is [phone].   

## 2020-01-06 ENCOUNTER — Encounter: Payer: Self-pay | Admitting: Dermatology

## 2020-01-13 LAB — ANATOMIC PATHOLOGY REPORT

## 2020-01-14 ENCOUNTER — Other Ambulatory Visit: Payer: Self-pay | Admitting: Neurology

## 2020-01-16 ENCOUNTER — Telehealth: Payer: Self-pay

## 2020-01-16 ENCOUNTER — Telehealth: Payer: Self-pay | Admitting: Gastroenterology

## 2020-01-16 NOTE — Telephone Encounter (Signed)
appt has been cancelled.

## 2020-01-16 NOTE — Telephone Encounter (Signed)
Pt is requesting to cancel her Endo 01/23/2020 at the hospital, pt was able to get seen sooner at [facility].

## 2020-01-16 NOTE — Telephone Encounter (Signed)
Informed pt of results. She had no concerns.  °

## 2020-01-16 NOTE — Telephone Encounter (Signed)
Left message for patient to call office for results/hd 

## 2020-01-16 NOTE — Telephone Encounter (Signed)
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 SEE COMMENTS.  Specimen 2-COMPOUND MELANOCYTIC NEVUS WITH MILD  ARCHITECTURAL DISORDER AND MILD CYTOLOGIC ATYPIA OF THE  MELANOCYTES (DYSPLASTIC NEVUS, MILD). SEE COMMENTS.  Specimen 3-DERMATOFIBROMA (BENIGN FIBROUS HISTIOCYTOMA).   1&2 - Dysplastic Mild Recheck at next visit 3- benign dermatofibroma May recur

## 2020-01-19 ENCOUNTER — Other Ambulatory Visit (HOSPITAL_COMMUNITY): Payer: BC Managed Care – PPO

## 2020-01-23 ENCOUNTER — Encounter (HOSPITAL_COMMUNITY): Admission: RE | Payer: Self-pay | Source: Home / Self Care

## 2020-01-23 ENCOUNTER — Ambulatory Visit (HOSPITAL_COMMUNITY)
Admission: RE | Admit: 2020-01-23 | Payer: BC Managed Care – PPO | Source: Home / Self Care | Admitting: Gastroenterology

## 2020-01-23 SURGERY — COLONOSCOPY WITH PROPOFOL
Anesthesia: Monitor Anesthesia Care

## 2020-01-30 ENCOUNTER — Other Ambulatory Visit: Payer: Self-pay | Admitting: Neurology

## 2020-02-14 ENCOUNTER — Telehealth: Payer: Self-pay | Admitting: *Deleted

## 2020-02-14 ENCOUNTER — Other Ambulatory Visit: Payer: Self-pay | Admitting: Gastroenterology

## 2020-02-14 NOTE — Telephone Encounter (Signed)
Called pt because we received fax from [facility] that they have been unable to reach her about refill for Aubagio. She is currently is the hospital, recently had surgery. She had extra medication and has not called yet for refill. She will call them back, nothing further needed.

## 2020-02-28 NOTE — Progress Notes (Signed)
Chief Complaint  Patient presents with  . Follow-up    multiple sclerosis, Rm 1, alone. aubagio.      HISTORY OF PRESENT ILLNESS: Today 02/29/20  Sandra Bonilla is a 44 y.o. female here today for follow up for RRMS. Last MRI in 2019 stable.   She had a bowel resection in 07/2019 for diverticulosis with complications requiring an ostomy for 6 months. She was hospitalized for 25 days and out of work 6 months. She returned to work this week. She was off Aubagio for about 6 weeks but denies any new or exacerbating symptoms.   She is planning to have bilateral lower extremity dopplers next week due to progressive swelling and pain of both legs. She reports that BP has been a little elevated over the past few weeks as well. She was taken off HTN agents during surgery. PCP started her back on chlorthalidone 12.5mg  yesterday.    Gabapentin helps with shooting pain in legs. She is taking 100mg  at bedtime. She has not needed meloxicam or cyclobenzaprine recently. Mood is stable. Ativan helps with anxiety and sleep. She continues oxybutynin BID for urinary frequency.    HISTORY (copied from previous note)   Sandra Bonilla is a 44 y.o. 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     Update 03/08/2019: She feels her MS is stable and she denies any exacerbation or new symptom.     She is walking well most of the time with occasioanl mild decreased balance.   No falls.    She denies any significant numbness or weakness.    She has urinary frequency and is on oxybutynin.    No UTI's.        Fatigue was worse with a recent illness but is closer to baseline.  She was hospitalized for an abdominal abscess a few months ago and he Aubagio was stopped x 2 weeks.    She noticed no issues going off or back on.   She had a drain placed and it was removed 2 weeks later.   She now feels close to baseline.    She was found to have mild asthma and was placed on an inhaler.    Follow up CT shows possible new abscess  but she feels fine.     She has diverticulosis.  She denies depression.   No cognitive issues.   She is on Lexapro and lamotrigine.     REVIEW OF SYSTEMS: Out of a complete 14 system review of symptoms, the patient complains only of the following symptoms, lower extremity swelling , dysesthesias, anxiety, fatigue, urinary frequency and all other reviewed systems are negative.   ALLERGIES: Allergies  Allergen Reactions  . Sulfamethoxazole-Trimethoprim Rash and Hives  . Sulfa Antibiotics Other (See Comments)     HOME MEDICATIONS: Outpatient Medications Prior to Visit  Medication Sig Dispense Refill  . acetaminophen (TYLENOL) 325 MG tablet Take 2 tablets (650 mg total) by mouth every 6 (six) hours as needed for mild pain (or Fever >/= 101).    . AUBAGIO 14 MG TABS TAKE 1 TABLET BY MOUTH  DAILY (Patient taking differently: Take 14 mg by mouth daily. ) 30 tablet 11  . chlorthalidone (HYGROTON) 25 MG tablet Take 12.5 mg by mouth every morning.    . cholecalciferol (VITAMIN D3) 25 MCG (1000 UT) tablet Take 1,000 Units by mouth daily.    . cyclobenzaprine (FLEXERIL) 5 MG tablet Take 1 tablet (5 mg total) by mouth at bedtime as needed. (Patient  taking differently: Take 5 mg by mouth at bedtime as needed for muscle spasms. ) 30 tablet 11  . ferrous gluconate (FERGON) 324 MG tablet TAKE 1 TABLET(324 MG) BY MOUTH DAILY WITH BREAKFAST 30 tablet 2  . folic acid (FOLVITE) 1 MG tablet TAKE 1 TABLET(1 MG) BY MOUTH DAILY 90 tablet 1  . gabapentin (NEURONTIN) 100 MG capsule Take 100 mg by mouth daily as needed (nerve pain).     . Ibuprofen-Acetaminophen 125-250 MG TABS Take by mouth.    . levonorgestrel (MIRENA) 20 MCG/24HR IUD 1 each by Intrauterine route once.    Marland Kitchen LORazepam (ATIVAN) 1 MG tablet Take 1 mg by mouth 2 (two) times daily as needed for anxiety.     . meloxicam (MOBIC) 15 MG tablet Take 1 tablet (15 mg total) by mouth daily. (Patient taking differently: Take 15 mg by mouth daily as needed  for pain. ) 30 tablet 5  . omeprazole (PRILOSEC) 40 MG capsule TAKE 1 CAPSULE(40 MG) BY MOUTH DAILY BEFORE BREAKFAST 30 capsule 2  . ondansetron (ZOFRAN-ODT) 4 MG disintegrating tablet Take 4 mg by mouth every 6 (six) hours as needed for nausea or vomiting.     Marland Kitchen oxybutynin (DITROPAN) 5 MG tablet TAKE 1 TABLET(5 MG) BY MOUTH TWICE DAILY (Patient taking differently: Take 5 mg by mouth 2 (two) times daily. ) 180 tablet 3  . polycarbophil (FIBERCON) 625 MG tablet Take 1 tablet (625 mg total) by mouth 3 (three) times daily.    . Polyvinyl Alcohol-Povidone (REFRESH OP) Place 2 drops into both eyes 4 (four) times daily as needed (dryness).    . Probiotic Product (PROBIOTIC PO) Take 1 capsule by mouth daily.    . Soft Lens Products (REFRESH CONTACTS DROPS) SOLN Apply to eye.    . TRELEGY ELLIPTA 100-62.5-25 MCG/INH AEPB Inhale 1 puff into the lungs daily.    Marland Kitchen loperamide (IMODIUM) 2 MG capsule Take 1 capsule (2 mg total) by mouth 3 (three) times daily before meals. (Patient not taking: Reported on 02/29/2020) 30 capsule 0  . oxyCODONE (OXY IR/ROXICODONE) 5 MG immediate release tablet Take 1-2 tablets (5-10 mg total) by mouth every 6 (six) hours as needed for moderate pain or severe pain. (Patient not taking: Reported on 02/29/2020) 30 tablet 0  . potassium chloride (MICRO-K) 10 MEQ CR capsule Take 10 mEq by mouth every morning. (Patient not taking: Reported on 02/29/2020)     No facility-administered medications prior to visit.     PAST MEDICAL HISTORY: Past Medical History:  Diagnosis Date  . Anemia 05/2019   iron infusions  . Anxiety   . Asthma   . Cervical high risk human papillomavirus (HPV) DNA test positive 09/2015  . Cholecystitis 2006   GALBLADDER REMOVED  . Depression   . Diverticulitis   . Diverticulosis   . Dysplastic nevus 08/24/2008   right upper back lat, right upper back medial  . 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 Head injury 06/25/2016  . History of mammogram 11/2014    FIBROADENOMA  . History of Papanicolaou smear of cervix 01/24/13; 10/15/15   -/-; -/+  . Hypertension   . Menorrhagia 02/01/2010   D/C HYSTEROSCOPY ENDO POLYP  . MS (multiple sclerosis) ([place])   . Ovarian cyst 9/15; 2015-2016   LEFT - WENT TO [facility]; RIGHT  . 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     PAST SURGICAL HISTORY: Past Surgical History:  Procedure Laterality Date  . BIOPSY  03/21/2019   Procedure:  BIOPSY;  Surgeon: [name], MD;  Location: [facility];  Service: Gastroenterology;;  . BREAST BIOPSY Right 2016   Fibroadenoma  . CHOLECYSTECTOMY  2006  . COLON RESECTION N/A 08/11/2019   Procedure: DIAGNOSTIC LAPAROSCOPY WITH [place] OUT, DRAIN PLACEMENT, AND LOOP ILEOSTOMY CREATION;  Surgeon: [name], MD;  Location: [facility];  Service: General;  Laterality: N/A;  . COLONOSCOPY WITH PROPOFOL N/A 03/21/2019   Procedure: COLONOSCOPY WITH PROPOFOL;  Surgeon: [name], MD;  Location: [facility];  Service: Gastroenterology;  Laterality: N/A;  ultraslim colonoscope   . CYSTOSCOPY WITH INJECTION N/A 08/04/2019   Procedure: FIREFLY INJECTION;  Surgeon: Ardis Hughs, MD;  Location: [facility];  Service: Urology;  Laterality: N/A;  . DILATION AND CURETTAGE, DIAGNOSTIC / THERAPEUTIC  02/01/2010   D/C HYSTEROSCOPY ENDO POLYP; [facility]  . ENDOSCOPIC MUCOSAL RESECTION  03/21/2019   Procedure: ENDOSCOPIC MUCOSAL RESECTION;  Surgeon: Rush Landmark Telford Nab., MD;  Location: [facility];  Service: Gastroenterology;;  . ESOPHAGOGASTRODUODENOSCOPY (EGD) WITH PROPOFOL N/A 03/21/2019   Procedure: ESOPHAGOGASTRODUODENOSCOPY (EGD) WITH PROPOFOL;  Surgeon: [name], MD;  Location: [facility];  Service: Gastroenterology;  Laterality: N/A;  . EYE SURGERY     Lasik  . FLEXIBLE SIGMOIDOSCOPY  07/31/2016   Procedure: FLEXIBLE SIGMOIDOSCOPY;  Surgeon: [name], MD;  Location: [facility];  Service: Endoscopy;;  . HEMOSTASIS CLIP PLACEMENT   03/21/2019   Procedure: HEMOSTASIS CLIP PLACEMENT;  Surgeon: [name], MD;  Location: [facility];  Service: Gastroenterology;;  . HYSTEROSCOPY  02/01/2010   ENDO POLYP - [facility]  . IR RADIOLOGIST EVAL & MGMT  11/16/2018  . IR RADIOLOGIST EVAL & MGMT  09/06/2019  . SCLEROTHERAPY  03/21/2019   Procedure: Clide Deutscher;  Surgeon: Mansouraty, Telford Nab., MD;  [facility];  Service: Gastroenterology;;     FAMILY HISTORY: Family History  Problem Relation Age of Onset  . Breast cancer Maternal Aunt 50       has contact  . Rheum arthritis Mother   . Hypertension Mother   . Thyroid disease Mother        HYPOTHYROIDISM  . Lung cancer Mother 3  . Heart disease Father   . Diabetes Father   . Hypertension Father   . Cancer Paternal Grandmother        MELANOMA OF SKIN  . Cancer Cousin        KIDNEY-COUSIN  . Colon cancer Neg Hx   . Stomach cancer Neg Hx   . Pancreatic cancer Neg Hx   . Esophageal cancer Neg Hx   . Inflammatory bowel disease Neg Hx   . Rectal cancer Neg Hx      SOCIAL HISTORY: Social History   Socioeconomic History  . Marital status: Divorced    Spouse name: Not on file  . Number of children: 0  . Years of education: 44  . Highest education level: Not on file  Occupational History    Employer: [employer]  Tobacco Use  . Smoking status: Former Smoker    Years: 10.00    Quit date: 2010    Years since quitting: 11.9  . Smokeless tobacco: Never Used  Vaping Use  . Vaping Use: Never used  Substance and Sexual Activity  . Alcohol use: No  . Drug use: No  . Sexual activity: Not Currently    Partners: Male    Birth control/protection: I.U.D.    Comment: Mirena  Other Topics Concern  . Not on file  Social History Narrative  .  Not on file   Social Determinants of Health   Financial Resource Strain:   . Difficulty of Paying Living Expenses: Not on file  Food Insecurity:   . Worried About Charity fundraiser in the Last Year: Not on  file  . Ran Out of Food in the Last Year: Not on file  Transportation Needs:   . Lack of Transportation (Medical): Not on file  . Lack of Transportation (Non-Medical): Not on file  Physical Activity:   . Days of Exercise per Week: Not on file  . Minutes of Exercise per Session: Not on file  Stress:   . Feeling of Stress : Not on file  Social Connections:   . Frequency of Communication with Friends and Family: Not on file  . Frequency of Social Gatherings with Friends and Family: Not on file  . Attends Religious Services: Not on file  . Active Member of Clubs or Organizations: Not on file  . Attends Archivist Meetings: Not on file  . Marital Status: Not on file  Intimate Partner Violence:   . Fear of Current or Ex-Partner: Not on file  . Emotionally Abused: Not on file  . Physically Abused: Not on file  . Sexually Abused: Not on file      PHYSICAL EXAM  Vitals:   02/29/20 0722  BP: (!) 172/100  Pulse: 73  SpO2: 99%  Weight: 182 lb 12.8 oz (82.9 kg)  Height: 5\' 6"  (1.676 m)   Body mass index is 29.5 kg/m.   Generalized: Well developed, in no acute distress  Cardiology: normal rate and rhythm, no murmur auscultated  Respiratory: clear to auscultation bilaterally    Neurological examination  Mentation: Alert oriented to time, place, history taking. Follows all commands speech and language fluent Cranial nerve II-XII: Pupils were equal round reactive to light. Extraocular movements were full, visual field were full on confrontational test. Facial sensation and strength were normal. Uvula tongue midline. Head turning and shoulder shrug  were normal and symmetric. Motor: The motor testing reveals 5 over 5 strength of all 4 extremities. Good symmetric motor tone is noted throughout.  Sensory: Sensory testing is intact to soft touch on all 4 extremities. No evidence of extinction is noted.  Coordination: Cerebellar testing reveals good finger-nose-finger and  heel-to-shin bilaterally.  Gait and station: Gait is normal.  Reflexes: Deep tendon reflexes are symmetric and normal bilaterally.     DIAGNOSTIC DATA (LABS, IMAGING, TESTING) - I reviewed patient records, labs, notes, testing and imaging myself where available.  Lab Results  Component Value Date   WBC 7.3 12/06/2019   HGB 11.7 (L) 12/06/2019   HCT 36.5 12/06/2019   MCV 81.7 12/06/2019   PLT 255 12/06/2019      Component Value Date/Time   NA 138 12/06/2019 1653   NA 141 02/05/2016 1533   K 3.9 12/06/2019 1653   CL 101 12/06/2019 1653   CO2 24 12/06/2019 1653   GLUCOSE 110 (H) 12/06/2019 1653   BUN 20 12/06/2019 1653   BUN 9 02/05/2016 1533   CREATININE 1.56 (H) 12/06/2019 1653   CALCIUM 9.8 12/06/2019 1653   PROT 7.3 12/06/2019 1653   PROT 6.6 12/30/2017 0905   ALBUMIN 4.1 12/06/2019 1653   ALBUMIN 4.2 12/30/2017 0905   AST 26 12/06/2019 1653   ALT 19 12/06/2019 1653   ALKPHOS 83 12/06/2019 1653   BILITOT 1.7 (H) 12/06/2019 1653   BILITOT 0.7 12/30/2017 0905   GFRNONAA 39 (L) 12/06/2019  [place] (L) 12/06/2019 1653   Lab Results  Component Value Date   TRIG 120 08/22/2019   No results found for: HGBA1C Lab Results  Component Value Date   VITAMINB12 235 03/11/2019   Lab Results  Component Value Date   TSH 0.87 12/23/2018      ASSESSMENT AND PLAN  44 y.o. year old female  has a past medical history of Anemia (05/2019), Anxiety, Asthma, Cervical high risk human papillomavirus (HPV) DNA test positive (09/2015), Cholecystitis (2006), Depression, Diverticulitis, Diverticulosis, Dysplastic nevus (08/24/2008), Fibroadenoma (2016), Head injury (06/25/2016), History of mammogram (11/2014), History of Papanicolaou smear of cervix (01/24/13; 10/15/15), Hypertension, Menorrhagia (02/01/2010), MS (multiple sclerosis) ([place]), Ovarian cyst (9/15; 2015-2016), and Pericolonic abscess due to diverticulitis (2020). here with   Multiple sclerosis ([place]) - Plan: CBC with  Differential/Platelets, CMP, Vitamin D, 25-hydroxy, MR BRAIN W WO CONTRAST  Other fatigue  Dysesthesia  Vitamin D deficiency  Depression with anxiety  Urinary frequency  Niema is doing quite well from an MS perspective.  We will continue Aubagio as prescribed.  I will order MRI for monitoring and update labs today.  She will continue gabapentin and oxybutynin.  May use cyclobenzaprine and meloxicam as needed.  She was warned against regular use of NSAIDs.  She will follow-up very closely with primary care for lower extremity swelling and elevated blood pressures.  Fortunately she is asymptomatic today.  No chest pain or difficulty breathing.  Negative [place].  Signs and symptoms of DVT versus PE discussed in the office and she was instructed on when to seek emergency medical attention.  Adequate hydration and use of compression stockings encouraged.  Healthy lifestyle habits advised.  She will follow-up with Dr. Felecia Shelling in 6 months, sooner if needed.  She verbalizes understanding and agreement with this plan.  I spent 30 minutes of face-to-face and non-face-to-face time with patient.  This included previsit chart review, lab review, study review, order entry, electronic health record documentation, patient education.    Debbora Presto, MSN, FNP-C 02/29/2020, 8:11 AM  [facility] [address] [place],  [address] [phone]

## 2020-02-29 ENCOUNTER — Ambulatory Visit: Payer: BC Managed Care – PPO | Admitting: Family Medicine

## 2020-02-29 ENCOUNTER — Telehealth: Payer: Self-pay | Admitting: Family Medicine

## 2020-02-29 ENCOUNTER — Encounter: Payer: Self-pay | Admitting: Family Medicine

## 2020-02-29 VITALS — BP 172/100 | HR 73 | Ht 66.0 in | Wt 182.8 lb

## 2020-02-29 DIAGNOSIS — R5383 Other fatigue: Secondary | ICD-10-CM

## 2020-02-29 DIAGNOSIS — R208 Other disturbances of skin sensation: Secondary | ICD-10-CM

## 2020-02-29 DIAGNOSIS — G35D Multiple sclerosis, unspecified: Secondary | ICD-10-CM

## 2020-02-29 DIAGNOSIS — G35 Multiple sclerosis: Secondary | ICD-10-CM

## 2020-02-29 DIAGNOSIS — E559 Vitamin D deficiency, unspecified: Secondary | ICD-10-CM | POA: Diagnosis not present

## 2020-02-29 DIAGNOSIS — F418 Other specified anxiety disorders: Secondary | ICD-10-CM

## 2020-02-29 DIAGNOSIS — R350 Frequency of micturition: Secondary | ICD-10-CM

## 2020-02-29 MED ORDER — AUBAGIO 14 MG PO TABS: 1.0000 | ORAL_TABLET | Freq: Every day | ORAL | 11 refills | Status: DC

## 2020-02-29 NOTE — Patient Instructions (Signed)
Below is our plan:  We will continue current treatment plan. I will order updated MRI and labs. Please continue to monitor blood pressure and swelling closely. Remember red flag warnings as discussed and seek medical evaluation immediately for any concerns of difficulty breathing or chest pain.   Please make sure you are staying well hydrated. I recommend 50-60 ounces daily. Well balanced diet and regular exercise encouraged.    Please continue follow up with care team as directed.   Follow up with Dr Felecia Shelling in 6 months   You may receive a survey regarding today's visit. I encourage you to leave honest feed back as I do use this information to improve patient care. Thank you for seeing me today!       Peripheral Edema  Peripheral edema is swelling that is caused by a buildup of fluid. Peripheral edema most often affects the lower legs, ankles, and feet. It can also develop in the arms, hands, and face. The area of the body that has peripheral edema will look swollen. It may also feel heavy or warm. Your clothes may start to feel tight. Pressing on the area may make a temporary dent in your skin. You may not be able to move your swollen arm or leg as much as usual. There are many causes of peripheral edema. It can happen because of a complication of other conditions such as congestive heart failure, kidney disease, or a problem with your blood circulation. It also can be a side effect of certain medicines or because of an infection. It often happens to women during pregnancy. Sometimes, the cause is not known. Follow these instructions at home: Managing pain, stiffness, and swelling   Raise (elevate) your legs while you are sitting or lying down.  Move around often to prevent stiffness and to lessen swelling.  Do not sit or stand for long periods of time.  Wear support stockings as told by your health care provider. Medicines  Take over-the-counter and prescription medicines only as  told by your health care provider.  Your health care provider may prescribe medicine to help your body get rid of excess water (diuretic). General instructions  Pay attention to any changes in your symptoms.  Follow instructions from your health care provider about limiting salt (sodium) in your diet. Sometimes, eating less salt may reduce swelling.  Moisturize skin daily to help prevent skin from cracking and draining.  Keep all follow-up visits as told by your health care provider. This is important. Contact a health care provider if you have:  A fever.  Edema that starts suddenly or is getting worse, especially if you are pregnant or have a medical condition.  Swelling in only one leg.  Increased swelling, redness, or pain in one or both of your legs.  Drainage or sores at the area where you have edema. Get help right away if you:  Develop shortness of breath, especially when you are lying down.  Have pain in your chest or abdomen.  Feel weak.  Feel faint. Summary  Peripheral edema is swelling that is caused by a buildup of fluid. Peripheral edema most often affects the lower legs, ankles, and feet.  Move around often to prevent stiffness and to lessen swelling. Do not sit or stand for long periods of time.  Pay attention to any changes in your symptoms.  Contact a health care provider if you have edema that starts suddenly or is getting worse, especially if you are pregnant or have  a medical condition.  Get help right away if you develop shortness of breath, especially when lying down. This information is not intended to replace advice given to you by your health care provider. Make sure you discuss any questions you have with your health care provider. Document Revised: 12/09/2017 Document Reviewed: 12/09/2017 Elsevier Patient Education  2020 [place].   Multiple Sclerosis Multiple sclerosis (MS) is a disease of the brain, spinal cord, and optic nerves  (central nervous system). It causes the body's disease-fighting (immune) system to destroy the protective covering (myelin sheath) around nerves in the brain. When this happens, signals (nerve impulses) going to and from the brain and spinal cord do not get sent properly or may not get sent at all. There are several types of MS:  Relapsing-remitting MS. This is the most common type. This causes sudden attacks of symptoms. After an attack, you may recover completely until the next attack, or some symptoms may remain permanently.  Secondary progressive MS. This usually develops after the onset of relapsing-remitting MS. Similar to relapsing-remitting MS, this type also causes sudden attacks of symptoms. Attacks may be less frequent, but symptoms slowly get worse (progress) over time.  Primary progressive MS. This causes symptoms that steadily progress over time. This type of MS does not cause sudden attacks of symptoms. The age of onset of MS varies, but it often develops between 18-85 years of age. MS is a lifelong (chronic) condition. There is no cure, but treatment can help slow down the progression of the disease. What are the causes? The cause of this condition is not known. What increases the risk? You are more likely to develop this condition if:  You are a woman.  You have a relative with MS. However, the condition is not passed from parent to child (inherited).  You have a lack (deficiency) of vitamin D.  You smoke. MS is more common in the [place] than in the [place]. What are the signs or symptoms? Relapsing-remitting and secondary progressive MS cause symptoms to occur in episodes or attacks that may last weeks to months. There may be long periods between attacks in which there are almost no symptoms. Primary progressive MS causes symptoms to steadily progress after they develop. Symptoms of MS vary because of the many different ways it affects the  central nervous system. The main symptoms include:  Vision problems and eye pain.  Numbness.  Weakness.  Inability to move your arms, hands, feet, or legs (paralysis).  Balance problems.  Shaking that you cannot control (tremors).  Muscle spasms.  Problems with thinking (cognitive changes). MS can also cause symptoms that are associated with the disease, but are not always the direct result of an MS attack. They may include:  Inability to control urination or bowel movements (incontinence).  Headaches.  Fatigue.  Inability to tolerate heat.  Emotional changes.  Depression.  Pain. How is this diagnosed? This condition is diagnosed based on:  Your symptoms.  A neurological exam. This involves checking central nervous system function, such as nerve function, reflexes, and coordination.  MRIs of the brain and spinal cord.  Lab tests, including a lumbar puncture that tests the fluid that surrounds the brain and spinal cord (cerebrospinal fluid).  Tests to measure the electrical activity of the brain in response to stimulation (evoked potentials). How is this treated? There is no cure for MS, but medicines can help decrease the number and frequency of attacks and help relieve nuisance  symptoms. Treatment options may include:  Medicines that reduce the frequency of attacks. These medicines may be given by injection, by mouth (orally), or through an IV.  Medicines that reduce inflammation (steroids). These may provide short-term relief of symptoms.  Medicines to help control pain, depression, fatigue, or incontinence.  Vitamin D, if you have a deficiency.  Using devices to help you move around (assistive devices), such as braces, a cane, or a walker.  Physical therapy to strengthen and stretch your muscles.  Occupational therapy to help you with everyday tasks.  Alternative or complementary treatments such as exercise, massage, or acupuncture. Follow these  instructions at home:  Take over-the-counter and prescription medicines only as told by your health care provider.  Do not drive or use heavy machinery while taking prescription pain medicine.  Use assistive devices as recommended by your physical therapist or your health care provider.  Exercise as directed by your health care provider.  Return to your normal activities as told by your health care provider. Ask your health care provider what activities are safe for you.  Reach out for support. Share your feelings with friends, family, or a support group.  Keep all follow-up visits as told by your health care provider and therapists. This is important. Where to find more information  National Multiple Sclerosis Society: https://www.nationalmssociety.org Contact a health care provider if:  You feel depressed.  You develop new pain or numbness.  You have tremors.  You have problems with sexual function. Get help right away if:  You develop paralysis.  You develop numbness.  You have problems with your bladder or bowel function.  You develop double vision.  You lose vision in one or both eyes.  You develop suicidal thoughts.  You develop severe confusion. If you ever feel like you may hurt yourself or others, or have thoughts about taking your own life, get help right away. You can go to your nearest emergency department or call:  Your local emergency services (911 in the [place]  A suicide crisis helpline, such as the [facility] at 575-576-9862. This is open 24 hours a day. Summary  Multiple sclerosis (MS) is a disease of the central nervous system that causes the body's immune system to destroy the protective covering (myelin sheath) around nerves in the brain.  There are 3 types of MS: relapsing-remitting, secondary progressive, and primary progressive. Relapsing-remitting and secondary progressive MS cause symptoms to occur in episodes or  attacks that may last weeks to months. Primary progressive MS causes symptoms to steadily progress after they develop.  There is no cure for MS, but medicines can help decrease the number and frequency of attacks and help relieve nuisance symptoms. Treatment may also include physical or occupational therapy.  If you develop numbness, paralysis, vision problems, or other neurological symptoms, get help right away. This information is not intended to replace advice given to you by your health care provider. Make sure you discuss any questions you have with your health care provider. Document Revised: 02/27/2017 Document Reviewed: 05/26/2016 Elsevier Patient Education  2020 [employer].

## 2020-02-29 NOTE — Telephone Encounter (Signed)
LVM for pt to call back  Dillon Bjork: [place] Ref # [id number]

## 2020-02-29 NOTE — Progress Notes (Signed)
I have read the note, and I agree with the clinical assessment and plan.  Abisai Coble A. Kenshawn Maciolek, MD, PhD, FAAN Certified in Neurology, Clinical Neurophysiology, Sleep Medicine, Pain Medicine and Neuroimaging  [facility] [address], [address] [place], [place] [address] [phone]  

## 2020-02-29 NOTE — Telephone Encounter (Signed)
Patient returned my call she is scheduled at [facility] for 03/06/20

## 2020-02-29 NOTE — Telephone Encounter (Signed)
Pt has called to report that [facility] needs a new prescription for pt's AUBAGIO 14 MG TABS

## 2020-02-29 NOTE — Addendum Note (Signed)
Addended by: Brandon Melnick on: 02/29/2020 03:33 PM   Modules accepted: Orders

## 2020-02-29 NOTE — Telephone Encounter (Signed)
Done to [employer].

## 2020-03-01 LAB — COMPREHENSIVE METABOLIC PANEL
ALT: 8 IU/L (ref 0–32)
AST: 17 IU/L (ref 0–40)
Albumin/Globulin Ratio: 1.5 (ref 1.2–2.2)
Albumin: 3.5 g/dL — ABNORMAL LOW (ref 3.8–4.8)
Alkaline Phosphatase: 96 IU/L (ref 44–121)
BUN/Creatinine Ratio: 16 (ref 9–23)
BUN: 11 mg/dL (ref 6–24)
Bilirubin Total: 1 mg/dL (ref 0.0–1.2)
CO2: 26 mmol/L (ref 20–29)
Calcium: 8.7 mg/dL (ref 8.7–10.2)
Chloride: 103 mmol/L (ref 96–106)
Creatinine, Ser: 0.69 mg/dL (ref 0.57–1.00)
GFR calc Af Amer: 121 mL/min/{1.73_m2} (ref >59)
GFR calc non Af Amer: 105 mL/min/{1.73_m2} (ref >59)
Globulin, Total: 2.4 g/dL (ref 1.5–4.5)
Glucose: 78 mg/dL (ref 65–99)
Potassium: 3.5 mmol/L (ref 3.5–5.2)
Sodium: 140 mmol/L (ref 134–144)
Total Protein: 5.9 g/dL — ABNORMAL LOW (ref 6.0–8.5)

## 2020-03-01 LAB — CBC WITH DIFFERENTIAL/PLATELET
Basophils Absolute: 0.1 10*3/uL (ref 0.0–0.2)
Basos: 2 %
EOS (ABSOLUTE): 0.2 10*3/uL (ref 0.0–0.4)
Eos: 5 %
Hematocrit: 33.2 % — ABNORMAL LOW (ref 34.0–46.6)
Hemoglobin: 10.9 g/dL — ABNORMAL LOW (ref 11.1–15.9)
Immature Grans (Abs): 0 10*3/uL (ref 0.0–0.1)
Immature Granulocytes: 0 %
Lymphocytes Absolute: 1.2 10*3/uL (ref 0.7–3.1)
Lymphs: 27 %
MCH: 27.9 pg (ref 26.6–33.0)
MCHC: 32.8 g/dL (ref 31.5–35.7)
MCV: 85 fL (ref 79–97)
Monocytes Absolute: 0.5 10*3/uL (ref 0.1–0.9)
Monocytes: 12 %
Neutrophils Absolute: 2.3 10*3/uL (ref 1.4–7.0)
Neutrophils: 54 %
Platelets: 225 10*3/uL (ref 150–450)
RBC: 3.9 x10E6/uL (ref 3.77–5.28)
RDW: 14.7 % (ref 11.7–15.4)
WBC: 4.3 10*3/uL (ref 3.4–10.8)

## 2020-03-01 LAB — VITAMIN D 25 HYDROXY (VIT D DEFICIENCY, FRACTURES): Vit D, 25-Hydroxy: 56.2 ng/mL (ref 30.0–100.0)

## 2020-03-06 ENCOUNTER — Other Ambulatory Visit: Payer: BC Managed Care – PPO

## 2020-03-06 ENCOUNTER — Ambulatory Visit (INDEPENDENT_AMBULATORY_CARE_PROVIDER_SITE_OTHER): Payer: BC Managed Care – PPO

## 2020-03-06 ENCOUNTER — Other Ambulatory Visit: Payer: Self-pay

## 2020-03-06 DIAGNOSIS — G35 Multiple sclerosis: Secondary | ICD-10-CM

## 2020-03-06 DIAGNOSIS — G35D Multiple sclerosis, unspecified: Secondary | ICD-10-CM

## 2020-03-06 MED ORDER — GADOBENATE DIMEGLUMINE 529 MG/ML IV SOLN
15.0000 mL | Freq: Once | INTRAVENOUS | Status: AC | PRN
  Administered 2020-03-06: 15 mL via INTRAVENOUS

## 2020-03-08 ENCOUNTER — Telehealth: Payer: Self-pay | Admitting: *Deleted

## 2020-03-08 NOTE — Telephone Encounter (Signed)
Spoke with pt and discussed results of MRI brain in detail as noted below by ALL, NP. The pt verbalized understanding and appreciation for the call.

## 2020-03-12 ENCOUNTER — Other Ambulatory Visit: Payer: Self-pay | Admitting: Neurology

## 2020-04-05 ENCOUNTER — Other Ambulatory Visit: Payer: Self-pay | Admitting: Gastroenterology

## 2020-04-11 ENCOUNTER — Other Ambulatory Visit: Payer: Self-pay | Admitting: Gastroenterology

## 2020-05-02 ENCOUNTER — Other Ambulatory Visit: Payer: Self-pay | Admitting: Gastroenterology

## 2020-05-14 ENCOUNTER — Ambulatory Visit (INDEPENDENT_AMBULATORY_CARE_PROVIDER_SITE_OTHER): Payer: BC Managed Care – PPO | Admitting: Obstetrics and Gynecology

## 2020-05-14 ENCOUNTER — Other Ambulatory Visit: Payer: Self-pay

## 2020-05-14 ENCOUNTER — Encounter: Payer: Self-pay | Admitting: Obstetrics and Gynecology

## 2020-05-14 VITALS — BP 120/80 | Ht 66.0 in | Wt 191.0 lb

## 2020-05-14 DIAGNOSIS — Z01419 Encounter for gynecological examination (general) (routine) without abnormal findings: Secondary | ICD-10-CM

## 2020-05-14 DIAGNOSIS — Z30431 Encounter for routine checking of intrauterine contraceptive device: Secondary | ICD-10-CM

## 2020-05-14 DIAGNOSIS — Z1231 Encounter for screening mammogram for malignant neoplasm of breast: Secondary | ICD-10-CM

## 2020-05-14 DIAGNOSIS — Z124 Encounter for screening for malignant neoplasm of cervix: Secondary | ICD-10-CM | POA: Diagnosis not present

## 2020-05-14 DIAGNOSIS — Z8742 Personal history of other diseases of the female genital tract: Secondary | ICD-10-CM | POA: Diagnosis not present

## 2020-05-14 DIAGNOSIS — Z1151 Encounter for screening for human papillomavirus (HPV): Secondary | ICD-10-CM

## 2020-05-14 NOTE — Patient Instructions (Signed)
I value your feedback and you entrusting us with your care. If you get a [facility] patient survey, I would appreciate you taking the time to let us know about your experience today. Thank you!  [facility]: [phone]      

## 2020-05-14 NOTE — Progress Notes (Signed)
Chief Complaint  Patient presents with  . Gynecologic Exam    No concerns  . [employer] Employee     HPI:      [name] is a 45 y.o. G0P0000 who LMP was Patient's last menstrual period was 05/05/2020 (approximate)., presents today for her annual examination.  Her menses are random with IUD. Bleeding lasts 7-10 days, mod to light flow, with small clots initially, occas BTB. Dysmenorrhea none. 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 Sex activity: not currently. Has Mirena, replaced 11/14/15. IUD in place on 7/20 CT scan. Last Pap: 01/26/19 Results were neg cells, POS HPV DNA. 12/01/17 Results: ASCUS/neg HPV DNA. 07/01/17  no abnormalities /neg HPV DNA. 10/15/16 pap was LGSIL/POS HPV DNA. Neg colpo and bx 9/18. She is due for repeat pap today. She is also on immune modulator med for MS and should have yearly paps.  Hx of STDs: HPV  Last mammogram: 10/21/17 Results were: normal--routine follow-up in 12 months There is a  FH of breast cancer in her mat aunt, genetic testing not indicated. There is no FH of ovarian cancer. The patient does do self-breast exams.  Tobacco use: The patient denies current or previous tobacco use. Alcohol use: none  No drug use. 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  Labs with PCP.  MS doing well.   Past Medical History:  Diagnosis Date  . Anemia 05/2019   iron infusions  . Anxiety   . Asthma   . Cervical high risk human papillomavirus (HPV) DNA test positive 09/2015  . Cholecystitis 2006   GALBLADDER REMOVED  . Depression   . Diverticulitis   . Diverticulosis   . Dysplastic nevus 08/24/2008   right upper back lat, right upper back medial  . 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 Head injury 06/25/2016  . History of  mammogram 11/2014   FIBROADENOMA  . History of Papanicolaou smear of cervix 01/24/13; 10/15/15   -/-; -/+  . Hypertension   . Menorrhagia 02/01/2010   D/C HYSTEROSCOPY ENDO POLYP  . MS (multiple sclerosis) ([place])   . Ovarian cyst 9/15; 2015-2016   LEFT - WENT TO [place]; RIGHT  . 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 BIOPSY  03/21/2019   Procedure: BIOPSY;  Surgeon: Rush Landmark Telford Nab., MD;  Location: [place];  Service: Gastroenterology;;  . BREAST BIOPSY Right 2016   Fibroadenoma  . CHOLECYSTECTOMY  2006  . COLON RESECTION N/A 08/11/2019   Procedure: DIAGNOSTIC LAPAROSCOPY WITH [place] OUT, DRAIN PLACEMENT, AND LOOP ILEOSTOMY CREATION;  Surgeon: [name], MD;  Location: [facility];  Service: General;  Laterality: N/A;  . COLONOSCOPY WITH PROPOFOL N/A 03/21/2019   Procedure: COLONOSCOPY WITH PROPOFOL;  Surgeon: [name], MD;  Location: [place];  Service: Gastroenterology;  Laterality: N/A;  ultraslim colonoscope   . CYSTOSCOPY WITH INJECTION N/A 08/04/2019   Procedure: FIREFLY INJECTION;  Surgeon: Ardis Hughs, MD;  Location: [facility];  Service: Urology;  Laterality: N/A;  . DILATION AND CURETTAGE, DIAGNOSTIC / THERAPEUTIC  02/01/2010   D/C HYSTEROSCOPY ENDO POLYP; [facility]  . ENDOSCOPIC MUCOSAL RESECTION  03/21/2019   Procedure: ENDOSCOPIC MUCOSAL RESECTION;  Surgeon: Rush Landmark Telford Nab., MD;  Location: [place];  Service:  Gastroenterology;;  . ESOPHAGOGASTRODUODENOSCOPY (EGD) WITH PROPOFOL N/A 03/21/2019   Procedure: ESOPHAGOGASTRODUODENOSCOPY (EGD) WITH PROPOFOL;  Surgeon: Rush Landmark Telford Nab., MD;  Location: [place];  Service: Gastroenterology;  Laterality: N/A;  . EYE SURGERY     Lasik  . FLEXIBLE SIGMOIDOSCOPY  07/31/2016   Procedure: FLEXIBLE SIGMOIDOSCOPY;  Surgeon: [name], MD;  Location: [facility];  Service: Endoscopy;;  . HEMOSTASIS CLIP PLACEMENT  03/21/2019    Procedure: HEMOSTASIS CLIP PLACEMENT;  Surgeon: [name], MD;  Location: [place];  Service: Gastroenterology;;  . HYSTEROSCOPY  02/01/2010   ENDO POLYP - [facility]  . IR RADIOLOGIST EVAL & MGMT  11/16/2018  . IR RADIOLOGIST EVAL & MGMT  09/06/2019  . SCLEROTHERAPY  03/21/2019   Procedure: Clide Deutscher;  Surgeon: Mansouraty, Telford Nab., MD;  [facility];  Service: Gastroenterology;;    Family History  Problem Relation Age of Onset  . Breast cancer Maternal Aunt 50       has contact  . Rheum arthritis Mother   . Hypertension Mother   . Thyroid disease Mother        HYPOTHYROIDISM  . Lung cancer Mother 15  . Heart disease Father   . Diabetes Father   . Hypertension Father   . Cancer Paternal Grandmother        MELANOMA OF SKIN  . Cancer Cousin        KIDNEY-COUSIN  . Colon cancer Neg Hx   . Stomach cancer Neg Hx   . Pancreatic cancer Neg Hx   . Esophageal cancer Neg Hx   . Inflammatory bowel disease Neg Hx   . Rectal cancer Neg Hx     Social History   Socioeconomic History  . Marital status: Divorced    Spouse name: Not on file  . Number of children: 0  . Years of education: 69  . Highest education level: Not on file  Occupational History    Employer: [employer]  Tobacco Use  . Smoking status: Former Smoker    Years: 10.00    Quit date: 2010    Years since quitting: 12.1  . Smokeless tobacco: Never Used  Vaping Use  . Vaping Use: Never used  Substance and Sexual Activity  . Alcohol use: No  . Drug use: No  . Sexual activity: Not Currently    Partners: Male    Birth control/protection: I.U.D.    Comment: Mirena  Other Topics Concern  . Not on file  Social History Narrative  . Not on file   Social Determinants of Health   Financial Resource Strain: Not on file  Food Insecurity: Not on file  Transportation Needs: Not on file  Physical Activity: Not on file  Stress: Not on file  Social Connections: Not on file  Intimate Partner  Violence: Not on file     Current Outpatient Medications:  .  acetaminophen (TYLENOL) 325 MG tablet, Take 2 tablets (650 mg total) by mouth every 6 (six) hours as needed for mild pain (or Fever >/= 101)., Disp: , Rfl:  .  chlorthalidone (HYGROTON) 25 MG tablet, Take 12.5 mg by mouth every morning., Disp: , Rfl:  .  cholecalciferol (VITAMIN D3) 25 MCG (1000 UT) tablet, Take 1,000 Units by mouth daily., Disp: , Rfl:  .  cyclobenzaprine (FLEXERIL) 5 MG tablet, Take 1 tablet (5 mg total) by mouth at bedtime as needed. (Patient taking differently: Take 5 mg by mouth at bedtime as needed for muscle spasms.), Disp: 30 tablet, Rfl: 11 .  ferrous gluconate (FERGON) 324 MG tablet, TAKE 1 TABLET(324 MG) BY MOUTH DAILY WITH BREAKFAST, Disp: 30 tablet, Rfl: 2 .  folic acid (FOLVITE) 1 MG tablet, TAKE 1 TABLET(1 MG) BY MOUTH DAILY, Disp: 90 tablet, Rfl: 1 .  gabapentin (NEURONTIN) 100 MG capsule, Take 100 mg by mouth daily as needed (nerve pain). , Disp: , Rfl:  .  Ibuprofen-Acetaminophen 125-250 MG TABS, Take by mouth., Disp: , Rfl:  .  levonorgestrel (MIRENA) 20 MCG/24HR IUD, 1 each by Intrauterine route once., Disp: , Rfl:  .  LORazepam (ATIVAN) 1 MG tablet, Take 1 mg by mouth 2 (two) times daily as needed for anxiety. , Disp: , Rfl:  .  meloxicam (MOBIC) 15 MG tablet, Take 1 tablet (15 mg total) by mouth daily. (Patient taking differently: Take 15 mg by mouth daily as needed for pain.), Disp: 30 tablet, Rfl: 5 .  omeprazole (PRILOSEC) 40 MG capsule, TAKE 1 CAPSULE(40 MG) BY MOUTH DAILY BEFORE BREAKFAST, Disp: 30 capsule, Rfl: 2 .  ondansetron (ZOFRAN-ODT) 4 MG disintegrating tablet, Take 4 mg by mouth every 6 (six) hours as needed for nausea or vomiting. , Disp: , Rfl:  .  oxybutynin (DITROPAN) 5 MG tablet, TAKE 1 TABLET BY MOUTH  TWICE DAILY, Disp: 180 tablet, Rfl: 3 .  Polyvinyl Alcohol-Povidone (REFRESH OP), Place 2 drops into both eyes 4 (four) times daily as needed (dryness)., Disp: , Rfl:  .   Probiotic Product (PROBIOTIC PO), Take 1 capsule by mouth daily., Disp: , Rfl:  .  Soft Lens Products (REFRESH CONTACTS DROPS) SOLN, Apply to eye., Disp: , Rfl:  .  Teriflunomide (AUBAGIO) 14 MG TABS, Take 1 tablet by mouth daily., Disp: 30 tablet, Rfl: 11 .  TRELEGY ELLIPTA 100-62.5-25 MCG/INH AEPB, Inhale 1 puff into the lungs daily., Disp: , Rfl:   ROS:  Review of Systems  Constitutional: Negative for fatigue, fever and unexpected weight change.  Respiratory: Negative for cough, shortness of breath and wheezing.   Cardiovascular: Negative for chest pain, palpitations and leg swelling.  Gastrointestinal: Negative for blood in stool, constipation, diarrhea, nausea and vomiting.  Endocrine: Negative for cold intolerance, heat intolerance and polyuria.  Genitourinary: Negative for dyspareunia, dysuria, flank pain, frequency, genital sores, hematuria, menstrual problem, pelvic pain, urgency, vaginal bleeding, vaginal discharge and vaginal pain.  Musculoskeletal: Positive for arthralgias. Negative for back pain, joint swelling and myalgias.  Skin: Negative for rash.  Neurological: Negative for dizziness, syncope, light-headedness, numbness and headaches.  Hematological: Negative for adenopathy.  Psychiatric/Behavioral: Positive for agitation. Negative for confusion, dysphoric mood, sleep disturbance and suicidal ideas. The patient is not nervous/anxious.      Objective: BP 120/80   Ht 5\' 6"  (1.676 m)   Wt 191 lb (86.6 kg)   LMP 05/05/2020 (Approximate)   BMI 30.83 kg/m    Physical Exam Constitutional:      Appearance: She is well-developed.  Genitourinary:     Vulva normal.     Genitourinary Comments: IUD STRINGS NOT SEEN IN CX OS     Right Labia: No rash, tenderness or lesions.    Left Labia: No tenderness, lesions or rash.    No vaginal discharge, erythema or tenderness.      Right Adnexa: not tender and no mass present.    Left Adnexa: not tender and no mass present.    No  cervical motion tenderness, friability or polyp.     No IUD strings visualized.     Uterus is not enlarged or tender.  Breasts:     Right: No mass, nipple discharge, skin change or tenderness.     Left: No mass, nipple discharge, skin change or tenderness.    Neck:     Thyroid: No thyromegaly.  Cardiovascular:     Rate and Rhythm: Normal rate and regular rhythm.     Heart sounds: Normal heart sounds. No murmur heard.   Pulmonary:     Effort: Pulmonary effort is normal.     Breath sounds: Normal breath sounds.  Abdominal:     Palpations: Abdomen is soft.     Tenderness: There is no abdominal tenderness. There is no guarding or rebound.  Musculoskeletal:        General: Normal range of motion.     Cervical back: Normal range of motion.  Lymphadenopathy:     Cervical: No cervical adenopathy.  Neurological:     General: No focal deficit present.     Mental Status: She is alert and oriented to person, place, and time.     Cranial Nerves: No cranial nerve deficit.  Skin:    General: Skin is warm and dry.  Psychiatric:        Mood and Affect: Mood normal.        Behavior: Behavior normal.        Thought Content: Thought content normal.        Judgment: Judgment normal.  Vitals reviewed.      Assessment/Plan: Encounter for annual routine gynecological examination  Cervical cancer screening - Plan: IGP, Aptima HPV  Screening for HPV (human papillomavirus) - Plan: IGP, Aptima HPV  History of abnormal cervical Pap smear - Plan: IGP, Aptima HPV; repeat pap today.  Encounter for routine checking of intrauterine contraceptive device (IUD); due for removal 8/24. Strings not in cx os.  Encounter for screening mammogram for malignant neoplasm of breast - Plan: MM 3D SCREEN BREAST BILATERAL; pt to sched mammo  GYN counsel mammography screening, adequate intake of calcium and vitamin D, diet and exercise     F/U  Return in about 1 year (around 05/14/2021).  Mansoor Hillyard B. Estle Sabella,  PA-C 05/14/2020 4:37 PM

## 2020-05-15 ENCOUNTER — Other Ambulatory Visit: Payer: Self-pay | Admitting: Gastroenterology

## 2020-05-16 LAB — IGP, APTIMA HPV: HPV Aptima: NEGATIVE

## 2020-05-22 ENCOUNTER — Telehealth: Payer: Self-pay | Admitting: *Deleted

## 2020-05-22 ENCOUNTER — Telehealth: Payer: Self-pay | Admitting: Neurology

## 2020-05-22 NOTE — Telephone Encounter (Signed)
Pt's "[employer]" form was faxed

## 2020-05-22 NOTE — Telephone Encounter (Signed)
Gave completed/signed LTD form from [employer] back to medical records to process for pt.

## 2020-05-22 NOTE — Telephone Encounter (Signed)
Done [place] [employer] form faxed on 05/22/[phone]

## 2020-05-29 ENCOUNTER — Ambulatory Visit
Admission: RE | Admit: 2020-05-29 | Discharge: 2020-05-29 | Disposition: A | Discharge status: Home / Self Care | Payer: BC Managed Care – PPO | Source: Ambulatory Visit | Attending: Obstetrics and Gynecology | Admitting: Obstetrics and Gynecology

## 2020-05-29 ENCOUNTER — Other Ambulatory Visit: Payer: Self-pay

## 2020-05-29 DIAGNOSIS — Z1231 Encounter for screening mammogram for malignant neoplasm of breast: Secondary | ICD-10-CM | POA: Diagnosis not present

## 2020-07-18 ENCOUNTER — Encounter: Payer: Self-pay | Admitting: Dermatology

## 2020-07-18 ENCOUNTER — Ambulatory Visit: Payer: BC Managed Care – PPO | Admitting: Dermatology

## 2020-07-18 ENCOUNTER — Other Ambulatory Visit: Payer: Self-pay

## 2020-07-18 DIAGNOSIS — D229 Melanocytic nevi, unspecified: Secondary | ICD-10-CM

## 2020-07-18 DIAGNOSIS — Q809 Congenital ichthyosis, unspecified: Secondary | ICD-10-CM | POA: Diagnosis not present

## 2020-07-18 DIAGNOSIS — L578 Other skin changes due to chronic exposure to nonionizing radiation: Secondary | ICD-10-CM

## 2020-07-18 DIAGNOSIS — L853 Xerosis cutis: Secondary | ICD-10-CM

## 2020-07-18 DIAGNOSIS — Z86018 Personal history of other benign neoplasm: Secondary | ICD-10-CM

## 2020-07-18 DIAGNOSIS — L719 Rosacea, unspecified: Secondary | ICD-10-CM | POA: Diagnosis not present

## 2020-07-18 NOTE — Progress Notes (Signed)
   Follow-Up Visit   Subjective  IRIDIAN READER is a 45 y.o. female who presents for the following: recheck dysplastic nevi (Of the R med knee and L upper back 3.0 cm lat to spine - bx proven mildly dysplastic) and Rosacea (Patient currently using Skin Medicinals triple mix cream QHS).  The following portions of the chart were reviewed this encounter and updated as appropriate:   Tobacco  Allergies  Meds  Problems  Med Hx  Surg Hx  Fam Hx     Review of Systems:  No other skin or systemic complaints except as noted in HPI or Assessment and Plan.  Objective  Well appearing patient in no apparent distress; mood and affect are within normal limits.  A focused examination was performed including the face, legs, and back . Relevant physical exam findings are noted in the Assessment and Plan.  Objective  Face: Pinkness of the chin or cheeks but no active papules  Objective  R med knee, L upper back 3.0 cm lat to spine: Clear.   Objective  B/L lower legs: Dry flaky skin.   Assessment & Plan  Rosacea Face Continue Skin Medicinals Triple cream QHS.   Rosacea is a chronic progressive skin condition usually affecting the face of adults, causing redness and/or acne bumps. It is treatable but not curable. It sometimes affects the eyes (ocular rosacea) as well. It may respond to topical and/or systemic medication and can flare with stress, sun exposure, alcohol, exercise and some foods.  Daily application of broad spectrum spf 30+ sunscreen to face is recommended to reduce flares.  History of dysplastic nevus R med knee, L upper back 3.0 cm lat to spine Clear. Observe for recurrence. Call clinic for new or changing lesions.  Recommend regular skin exams, daily broad-spectrum spf 30+ sunscreen use, and photoprotection.     Ichthyosis B/L lower legs Recommend Amlactin Rapid Relief cream daily.   Xerosis - diffuse xerotic patches - recommend gentle, hydrating skin care - gentle  skin care handout given  Melanocytic Nevi - Tan-brown and/or pink-flesh-colored symmetric macules and papules - Benign appearing on exam today - Observation - Call clinic for new or changing moles - Recommend daily use of broad spectrum spf 30+ sunscreen to sun-exposed areas.   Actinic Damage - chronic, secondary to cumulative UV radiation exposure/sun exposure over time - diffuse scaly erythematous macules with underlying dyspigmentation - Recommend daily broad spectrum sunscreen SPF 30+ to sun-exposed areas, reapply every 2 hours as needed.  - Recommend staying in the shade or wearing long sleeves, sun glasses (UVA+UVB protection) and wide brim hats (4-inch brim around the entire circumference of the hat). - Call for new or changing lesions.  Return in about 6 months (around 01/17/2021) for TBSE - hx dysplastic nevi .  Luther Redo, CMA, am acting as scribe for Sarina Ser, MD .  Documentation: I have reviewed the above documentation for accuracy and completeness, and I agree with the above.  Sarina Ser, MD

## 2020-07-18 NOTE — Patient Instructions (Signed)

## 2020-07-20 ENCOUNTER — Encounter: Payer: Self-pay | Admitting: Dermatology

## 2020-09-03 ENCOUNTER — Ambulatory Visit: Payer: BC Managed Care – PPO | Admitting: Neurology

## 2020-09-03 ENCOUNTER — Encounter: Payer: Self-pay | Admitting: Neurology

## 2020-09-03 VITALS — BP 117/76 | HR 80 | Ht 66.0 in | Wt 220.0 lb

## 2020-09-03 DIAGNOSIS — R208 Other disturbances of skin sensation: Secondary | ICD-10-CM | POA: Diagnosis not present

## 2020-09-03 DIAGNOSIS — G35 Multiple sclerosis: Secondary | ICD-10-CM

## 2020-09-03 DIAGNOSIS — R269 Unspecified abnormalities of gait and mobility: Secondary | ICD-10-CM | POA: Diagnosis not present

## 2020-09-03 DIAGNOSIS — G5601 Carpal tunnel syndrome, right upper limb: Secondary | ICD-10-CM | POA: Insufficient documentation

## 2020-09-03 DIAGNOSIS — F419 Anxiety disorder, unspecified: Secondary | ICD-10-CM

## 2020-09-03 DIAGNOSIS — R350 Frequency of micturition: Secondary | ICD-10-CM | POA: Diagnosis not present

## 2020-09-03 NOTE — Progress Notes (Signed)
[facility]  PATIENT: Sandra Bonilla DOB: 1975/10/28  REFERRING DOCTOR OR PCP:   Dorisann Frames SOURCE: patient and medical records  _________________________________   HISTORICAL  CHIEF COMPLAINT:  Chief Complaint  Patient presents with  . Follow-up    RM 12, alone. Last seen 02/29/20 by AL,NP. On Aubagio for MS. In the last week, having weakness in right hand/arm. Hard to hang onto objects like a brush. Has intermittent numbness (this is not new).     HISTORY OF PRESENT ILLNESS:  Sandra Bonilla is a 45 y.o. 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     Update 09/03/2020: She feels her MS is mostly stable and she denies any exacerbation.   She is on Aubagio and tolerates t well.    She is noting more symptoms in her right hand.   She has numbness in the whole hand and is dropping items.        Gait is ok.  Sometimes she feels she veers to the right.   No falls.   Besides the ight hand/arm, no other numbness or weakness.    She has urinary frequency and is on oxybutynin.  It is helping.  No UTI's.        Fatigue has been worse the past year.  She had abscesses and colon surgery.    She has insomnia but sleep aids cause a hangover so she is not taking anything now.     She denies depression but has anxiety.   No major cognitive issues but sh occasionally feels overwhelmed or as brain fog.  .   She is on Buspar and lorazepam for the anxiety.    She had colon surgery last year and had complications so had a short term ostomy bag and feels she is still recovering from her surgery.      MS HIstory:       About  2006, she noted gait changes and her right foot was clumsy.    She had an MRI consistent with MS and was referred to Dr. Jacolyn Reedy [facility]) who diagnosed her with MS.   She then saw Dr. Felipe Drone and then Dr. Deloria Lair at [facility].  She was reluctant to take a medication until  2012 years ago when she started Copaxone.   She had skin reactions, she stopped after one  year and switched to Aubagio (+/- late 2013).   She has tolerated it well.     Imaging MRI brain 03/06/2020:  Multiple T2/FLAIR hyperintense foci in the periventricular, juxtacortical and deep white matter of both hemispheres.  Additional foci are noted in the spinal cord adjacent to C2, right cerebral peduncle and right cerebellar hemisphere.  None of the foci appear to be acute and they do not enhance.  Compared to the MRI dated 01/20/2018, there are no new lesions.  Small developmental venous anomaly in the right medial parietal lobe  REVIEW OF SYSTEMS: Constitutional: No fevers, chills, sweats, or change in appetite   She notes fatigue and poor sleep.    Eyes: No visual changes, double vision, eye pain Ear, nose and throat: No hearing loss, ear pain, nasal congestion, sore throat Cardiovascular: No chest pain, palpitations Respiratory: No shortness of breath at rest or with exertion.   No wheezes.  Some snoring GastrointestinaI: No nausea, vomiting, diarrhea, abdominal pain, fecal incontinence Genitourinary: No dysuria, urinary retention or frequency.  No nocturia. Musculoskeletal: No neck pain, back pain Integumentary: No rash, pruritus, skin lesions Neurological: as above Psychiatric: No  depression at this time. Some anxiety Endocrine: No palpitations, diaphoresis, change in appetite, change in weigh or increased thirst Hematologic/Lymphatic: No anemia, purpura, petechiae. Allergic/Immunologic: No itchy/runny eyes, nasal congestion, recent allergic reactions, rashes  ALLERGIES: Allergies  Allergen Reactions  . Sulfamethoxazole-Trimethoprim Rash and Hives  . Sulfa Antibiotics Other (See Comments)    HOME MEDICATIONS:  Current Outpatient Medications:  .  acetaminophen (TYLENOL) 325 MG tablet, Take 2 tablets (650 mg total) by mouth every 6 (six) hours as needed for mild pain (or Fever >/= 101)., Disp: , Rfl:  .  busPIRone (BUSPAR) 7.5 MG tablet, Take 7.5 mg by mouth 2 (two)  times daily., Disp: , Rfl:  .  chlorthalidone (HYGROTON) 25 MG tablet, Take 12.5 mg by mouth every morning., Disp: , Rfl:  .  cholecalciferol (VITAMIN D3) 25 MCG (1000 UT) tablet, Take 1,000 Units by mouth daily., Disp: , Rfl:  .  cyclobenzaprine (FLEXERIL) 5 MG tablet, Take 1 tablet (5 mg total) by mouth at bedtime as needed. (Patient taking differently: Take 5 mg by mouth at bedtime as needed for muscle spasms.), Disp: 30 tablet, Rfl: 11 .  gabapentin (NEURONTIN) 100 MG capsule, Take 100 mg by mouth daily as needed (nerve pain). , Disp: , Rfl:  .  Ibuprofen-Acetaminophen 125-250 MG TABS, Take by mouth., Disp: , Rfl:  .  levonorgestrel (MIRENA) 20 MCG/24HR IUD, 1 each by Intrauterine route once., Disp: , Rfl:  .  LORazepam (ATIVAN) 1 MG tablet, Take 1 mg by mouth 2 (two) times daily as needed for anxiety. , Disp: , Rfl:  .  meloxicam (MOBIC) 15 MG tablet, Take 1 tablet (15 mg total) by mouth daily. (Patient taking differently: Take 15 mg by mouth daily as needed for pain.), Disp: 30 tablet, Rfl: 5 .  ondansetron (ZOFRAN-ODT) 4 MG disintegrating tablet, Take 4 mg by mouth every 6 (six) hours as needed for nausea or vomiting. , Disp: , Rfl:  .  oxybutynin (DITROPAN) 5 MG tablet, TAKE 1 TABLET BY MOUTH  TWICE DAILY, Disp: 180 tablet, Rfl: 3 .  Polyvinyl Alcohol-Povidone (REFRESH OP), Place 2 drops into both eyes 4 (four) times daily as needed (dryness)., Disp: , Rfl:  .  Probiotic Product (PROBIOTIC PO), Take 1 capsule by mouth daily., Disp: , Rfl:  .  Soft Lens Products (REFRESH CONTACTS DROPS) SOLN, Apply to eye., Disp: , Rfl:  .  Teriflunomide (AUBAGIO) 14 MG TABS, Take 1 tablet by mouth daily., Disp: 30 tablet, Rfl: 11 .  TRELEGY ELLIPTA 100-62.5-25 MCG/INH AEPB, Inhale 1 puff into the lungs daily., Disp: , Rfl:   PAST MEDICAL HISTORY: Past Medical History:  Diagnosis Date  . Anemia 05/2019   iron infusions  . Anxiety   . Asthma   . Cervical high risk human papillomavirus (HPV) DNA test  positive 09/2015  . Cholecystitis 2006   GALBLADDER REMOVED  . Depression   . Diverticulitis   . Diverticulosis   . Dysplastic nevus 08/24/2008   right upper back lat, right upper back medial  . 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 Head injury 06/25/2016  . History of mammogram 11/2014   FIBROADENOMA  . History of Papanicolaou smear of cervix 01/24/13; 10/15/15   -/-; -/+  . Hypertension   . Menorrhagia 02/01/2010   D/C HYSTEROSCOPY ENDO POLYP  . MS (multiple sclerosis) ([place])   . Ovarian cyst 9/15; 2015-2016   LEFT - WENT TO [place]; RIGHT  . 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    PAST  SURGICAL HISTORY: Past Surgical History:  Procedure Laterality Date  . BIOPSY  03/21/2019   Procedure: BIOPSY;  Surgeon: Rush Landmark Telford Nab., MD;  Location: [facility];  Service: Gastroenterology;;  . BREAST BIOPSY Right 2016   Fibroadenoma  . CHOLECYSTECTOMY  2006  . COLON RESECTION N/A 08/11/2019   Procedure: DIAGNOSTIC LAPAROSCOPY WITH [place] OUT, DRAIN PLACEMENT, AND LOOP ILEOSTOMY CREATION;  Surgeon: [name], MD;  Location: [facility];  Service: General;  Laterality: N/A;  . COLONOSCOPY WITH PROPOFOL N/A 03/21/2019   Procedure: COLONOSCOPY WITH PROPOFOL;  Surgeon: [name], MD;  Location: [facility];  Service: Gastroenterology;  Laterality: N/A;  ultraslim colonoscope   . CYSTOSCOPY WITH INJECTION N/A 08/04/2019   Procedure: FIREFLY INJECTION;  Surgeon: Ardis Hughs, MD;  Location: [facility];  Service: Urology;  Laterality: N/A;  . DILATION AND CURETTAGE, DIAGNOSTIC / THERAPEUTIC  02/01/2010   D/C HYSTEROSCOPY ENDO POLYP; [facility]  . ENDOSCOPIC MUCOSAL RESECTION  03/21/2019   Procedure: ENDOSCOPIC MUCOSAL RESECTION;  Surgeon: Rush Landmark Telford Nab., MD;  Location: [facility];  Service: Gastroenterology;;  . ESOPHAGOGASTRODUODENOSCOPY (EGD) WITH PROPOFOL N/A 03/21/2019   Procedure: ESOPHAGOGASTRODUODENOSCOPY (EGD) WITH PROPOFOL;  Surgeon: [name], MD;  Location: [facility] [place];  Service: Gastroenterology;  Laterality: N/A;  . EYE SURGERY     Lasik  . FLEXIBLE SIGMOIDOSCOPY  07/31/2016   Procedure: FLEXIBLE SIGMOIDOSCOPY;  Surgeon: [name], MD;  Location: [facility];  Service: Endoscopy;;  . HEMOSTASIS CLIP PLACEMENT  03/21/2019   Procedure: HEMOSTASIS CLIP PLACEMENT;  Surgeon: [name], MD;  Location: [facility];  Service: Gastroenterology;;  . HYSTEROSCOPY  02/01/2010   ENDO POLYP - [facility]  . IR RADIOLOGIST EVAL & MGMT  11/16/2018  . IR RADIOLOGIST EVAL & MGMT  09/06/2019  . SCLEROTHERAPY  03/21/2019   Procedure: Clide Deutscher;  Surgeon: Mansouraty, Telford Nab., MD;  Location: [facility];  Service: Gastroenterology;;    FAMILY HISTORY: Family History  Problem Relation Age of Onset  . Breast cancer Maternal Aunt 50       has contact  . Rheum arthritis Mother   . Hypertension Mother   . Thyroid disease Mother        HYPOTHYROIDISM  . Lung cancer Mother 28  . Heart disease Father   . Diabetes Father   . Hypertension Father   . Cancer Paternal Grandmother        MELANOMA OF SKIN  . Cancer Cousin        KIDNEY-COUSIN  . Colon cancer Neg Hx   . Stomach cancer Neg Hx   . Pancreatic cancer Neg Hx   . Esophageal cancer Neg Hx   . Inflammatory bowel disease Neg Hx   . Rectal cancer Neg Hx     SOCIAL HISTORY:  Social History   Socioeconomic History  . Marital status: Divorced    Spouse name: Not on file  . Number of children: 0  . Years of education: 86  . Highest education level: Not on file  Occupational History    Employer: [employer]  Tobacco Use  . Smoking status: Former Smoker    Years: 10.00    Quit date: 2010    Years since quitting: 12.4  . Smokeless tobacco: Never Used  Vaping Use  . Vaping Use: Never used  Substance and Sexual Activity  . Alcohol use: No  . Drug use: No  . Sexual activity: Not Currently    Partners: Male    Birth control/protection: I.U.D.    Comment:  Mirena  Other Topics Concern  . Not on file  Social History Narrative  . Not on file   Social Determinants of Health   Financial Resource Strain: Not on file  Food Insecurity: Not on file  Transportation Needs: Not on file  Physical Activity: Not on file  Stress: Not on file  Social Connections: Not on file  Intimate Partner Violence: Not on file     PHYSICAL EXAM  Vitals:   09/03/20 0814  BP: 117/76  Pulse: 80  Weight: 220 lb (99.8 kg)  Height: 5\' 6"  (1.676 m)    Body mass index is 35.51 kg/m.   General: The patient is well-developed and well-nourished and in no acute distress.     Neurologic Exam  Mental status: The patient is alert and oriented x 3 at the time of the examination. The patient has apparent normal recent and remote memory, with an apparently normal attention span and concentration ability.   Speech is normal.  Cranial nerves: Extraocular muscles are normal.  Facial strength and sensation are normal.   Trapezius is strong  Hearing is normal   Motor:  Muscle bulk is normal.   Muscle tone and strength is normal in the arms and legs exept 4+/5 right APB strength  Other:   Positive right Tinel's and Phalen's signs c/w right CTS.  Sensory: She has intact sensation to touch and vibration in the arms and legs.  Coordination: Cerebellar testing reveals good finger-nose-finger and heel-to-shin bilaterally.  Gait and station: Station is normal.   The gait is normal  Tandem gait is slightly wide.  Romberg is negative.  Reflexes: Deep tendon reflexes are symmetric and normal bilaterally in arms and legs.    No clonus or spread.        DIAGNOSTIC DATA (LABS, IMAGING, TESTING) - I reviewed patient records, labs, notes, testing and imaging myself where available.  Lab Results  Component Value Date   WBC 4.3 02/29/2020   HGB 10.9 (L) 02/29/2020   HCT 33.2 (L) 02/29/2020   MCV 85 02/29/2020   PLT 225 02/29/2020      Component Value Date/Time   NA 140  02/29/2020 0810   K 3.5 02/29/2020 0810   CL 103 02/29/2020 0810   CO2 26 02/29/2020 0810   GLUCOSE 78 02/29/2020 0810   GLUCOSE 110 (H) 12/06/2019 1653   BUN 11 02/29/2020 0810   CREATININE 0.69 02/29/2020 0810   CALCIUM 8.7 02/29/2020 0810   PROT 5.9 (L) 02/29/2020 0810   ALBUMIN 3.5 (L) 02/29/2020 0810   AST 17 02/29/2020 0810   ALT 8 02/29/2020 0810   ALKPHOS 96 02/29/2020 0810   BILITOT 1.0 02/29/2020 0810   GFRNONAA 105 02/29/2020 0810   GFRAA 121 02/29/2020 0810       ASSESSMENT AND PLAN  Multiple sclerosis (HCC)  Dysesthesia  Urinary frequency  Gait disturbance  Anxiety  Right carpal tunnel syndrome   1.    Continue Aubagio.    Labs were fine 02/2020.  MRI unchanged 02/2020 2.   Continue  oxybutynin for bladder.      Continue vit D supplements.  3.   Continue to be active and exercise as tolerated. 4.   Advised to wear right wrist splint at night.   If hand numbness (probable CTS) worsens, check bilateral arm NCV/EMG  5.    She will return to see [place] in 6 months or sooner if there are new or worsening neurologic symptoms.     Lowen Mansouri  August Saucer, MD, PhD 09/03/2945, 6:54 AM Certified in Neurology, Clinical Neurophysiology, Sleep Medicine, Pain Medicine and Neuroimaging  [facility] [address] [place], [place] [address] [phone]

## 2020-11-22 ENCOUNTER — Other Ambulatory Visit: Payer: Self-pay | Admitting: *Deleted

## 2020-11-22 MED ORDER — OXYBUTYNIN CHLORIDE 5 MG PO TABS: ORAL_TABLET | ORAL | 3 refills | Status: DC

## 2020-12-25 ENCOUNTER — Telehealth: Payer: Self-pay | Admitting: Family Medicine

## 2020-12-25 NOTE — Telephone Encounter (Signed)
Pt called, having burning sensation I right thigh. Have had for a couple of months, pain comes and goes. Would like a call from the nurse.

## 2020-12-25 NOTE — Telephone Encounter (Signed)
Called the patient to get more information. Couple months ago she started having this pain/burning sensation in the right thigh. States that it is intermittent. Indicated that she notices its becoming more frequent and unbearable. Denies signs of infection and denies having any other MD related symptoms. She states it is localized to the right thigh. There is nothing she can think of that is triggering this feeling. I was able to get the pt worked in sooner on Thursday with Dr Felecia Shelling. Pt verbalized understanding and was appreciative for the call back.

## 2020-12-27 ENCOUNTER — Encounter: Payer: Self-pay | Admitting: Neurology

## 2020-12-27 ENCOUNTER — Ambulatory Visit: Payer: BC Managed Care – PPO | Admitting: Neurology

## 2020-12-27 VITALS — BP 134/81 | HR 81 | Ht 66.0 in | Wt 234.0 lb

## 2020-12-27 DIAGNOSIS — R269 Unspecified abnormalities of gait and mobility: Secondary | ICD-10-CM

## 2020-12-27 DIAGNOSIS — R208 Other disturbances of skin sensation: Secondary | ICD-10-CM | POA: Diagnosis not present

## 2020-12-27 DIAGNOSIS — G35 Multiple sclerosis: Secondary | ICD-10-CM

## 2020-12-27 DIAGNOSIS — R350 Frequency of micturition: Secondary | ICD-10-CM

## 2020-12-27 DIAGNOSIS — F419 Anxiety disorder, unspecified: Secondary | ICD-10-CM | POA: Diagnosis not present

## 2020-12-27 DIAGNOSIS — G5711 Meralgia paresthetica, right lower limb: Secondary | ICD-10-CM

## 2020-12-27 MED ORDER — GABAPENTIN 300 MG PO CAPS: ORAL_CAPSULE | ORAL | 11 refills | Status: DC

## 2020-12-27 NOTE — Progress Notes (Signed)
[facility]  PATIENT: Sandra Bonilla DOB: 08/04/1975  REFERRING DOCTOR OR PCP:   Dorisann Frames SOURCE: patient and medical records  _________________________________   HISTORICAL  CHIEF COMPLAINT:  Chief Complaint  Patient presents with   Follow-up    Rm 1, alone. Here to f/u for new sx. Pt c/o of burning sensation in R thigh, not every day, but has become more often, sine the last couple months. Can occur at any moment.     HISTORY OF PRESENT ILLNESS:  Sandra Bonilla is a 45 y.o. 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     Update 12/27/2020: She feels her MS is mostly stable and she denies any exacerbation.   MRI 02/2020 showed no new lesions.  She is on Aubagio and tolerates t well.    She is noting more symptoms in her right hand.   She is noting pain in her right anterolateral thigh since a couple months ago.  Pain fluctuates and is sometimes severe.  It bothers her both day and night.     She is on gabapentin long term but just 100 mg po qHS.     Gait is ok.  Sometimes she feels she veers to the right.   No falls.   Besides the ight hand/arm, no other numbness or weakness.    She has urinary frequency and is on oxybutynin.  It is helping.  No UTI's.        Fatigue has been worse the past year.  She had abscesses and colon surgery.    She has insomnia but sleep aids cause a hangover so she is not taking anything now.     She denies depression but has anxiety.   No major cognitive issues but occasioanal brain fog. .   She is on Buspar and lorazepam for the anxiety.    She had bowel surgery and required a short term ed colostomy (she had diverticulosis).       MS HIstory:       About  2006, she noted gait changes and her right foot was clumsy.    She had an MRI consistent with MS and was referred to Dr. Jacolyn Reedy [facility]) who diagnosed her with MS.   She then saw Dr. Felipe Drone and then Dr. Deloria Lair at [facility].  She was reluctant to take a medication until   2012 years ago when she started Copaxone.   She had skin reactions, she stopped after one year and switched to Aubagio (+/- late 2013).   She has tolerated it well.     Imaging MRI brain 03/06/2020:  Multiple T2/FLAIR hyperintense foci in the periventricular, juxtacortical and deep white matter of both hemispheres.  Additional foci are noted in the spinal cord adjacent to C2, right cerebral peduncle and right cerebellar hemisphere.  None of the foci appear to be acute and they do not enhance.  Compared to the MRI dated 01/20/2018, there are no new lesions.  Small developmental venous anomaly in the right medial parietal lobe  REVIEW OF SYSTEMS: Constitutional: No fevers, chills, sweats, or change in appetite   She notes fatigue and poor sleep.    Eyes: No visual changes, double vision, eye pain Ear, nose and throat: No hearing loss, ear pain, nasal congestion, sore throat Cardiovascular: No chest pain, palpitations Respiratory:  No shortness of breath at rest or with exertion.   No wheezes.  Some snoring GastrointestinaI: No nausea, vomiting, diarrhea, abdominal pain, fecal incontinence Genitourinary:  No dysuria, urinary  retention or frequency.  No nocturia. Musculoskeletal:  No neck pain, back pain Integumentary: No rash, pruritus, skin lesions Neurological: as above Psychiatric: No depression at this time. Some anxiety Endocrine: No palpitations, diaphoresis, change in appetite, change in weigh or increased thirst Hematologic/Lymphatic:  No anemia, purpura, petechiae. Allergic/Immunologic: No itchy/runny eyes, nasal congestion, recent allergic reactions, rashes  ALLERGIES: Allergies  Allergen Reactions   Sulfamethoxazole-Trimethoprim Rash and Hives   Sulfa Antibiotics Other (See Comments)    HOME MEDICATIONS:  Current Outpatient Medications:    acetaminophen (TYLENOL) 325 MG tablet, Take 2 tablets (650 mg total) by mouth every 6 (six) hours as needed for mild pain (or Fever >/=  101)., Disp: , Rfl:    busPIRone (BUSPAR) 7.5 MG tablet, Take 7.5 mg by mouth 2 (two) times daily., Disp: , Rfl:    chlorthalidone (HYGROTON) 25 MG tablet, Take 12.5 mg by mouth every morning., Disp: , Rfl:    cholecalciferol (VITAMIN D3) 25 MCG (1000 UT) tablet, Take 1,000 Units by mouth daily., Disp: , Rfl:    cyclobenzaprine (FLEXERIL) 5 MG tablet, Take 1 tablet (5 mg total) by mouth at bedtime as needed. (Patient taking differently: Take 5 mg by mouth at bedtime as needed for muscle spasms.), Disp: 30 tablet, Rfl: 11   gabapentin (NEURONTIN) 300 MG capsule, One po qAM, one po qPM and two po qHS, Disp: 120 capsule, Rfl: 11   Ibuprofen-Acetaminophen 125-250 MG TABS, Take by mouth., Disp: , Rfl:    levonorgestrel (MIRENA) 20 MCG/24HR IUD, 1 each by Intrauterine route once., Disp: , Rfl:    LORazepam (ATIVAN) 1 MG tablet, Take 1 mg by mouth 2 (two) times daily as needed for anxiety. , Disp: , Rfl:    meloxicam (MOBIC) 15 MG tablet, Take 1 tablet (15 mg total) by mouth daily. (Patient taking differently: Take 15 mg by mouth daily as needed for pain.), Disp: 30 tablet, Rfl: 5   ondansetron (ZOFRAN-ODT) 4 MG disintegrating tablet, Take 4 mg by mouth every 6 (six) hours as needed for nausea or vomiting. , Disp: , Rfl:    oxybutynin (DITROPAN) 5 MG tablet, TAKE 1 TABLET BY MOUTH  TWICE DAILY, Disp: 180 tablet, Rfl: 3   Polyvinyl Alcohol-Povidone (REFRESH OP), Place 2 drops into both eyes 4 (four) times daily as needed (dryness)., Disp: , Rfl:    Probiotic Product (PROBIOTIC PO), Take 1 capsule by mouth daily., Disp: , Rfl:    Soft Lens Products (REFRESH CONTACTS DROPS) SOLN, Apply to eye., Disp: , Rfl:    Teriflunomide (AUBAGIO) 14 MG TABS, Take 1 tablet by mouth daily., Disp: 30 tablet, Rfl: 11   TRELEGY ELLIPTA 100-62.5-25 MCG/INH AEPB, Inhale 1 puff into the lungs daily., Disp: , Rfl:   PAST MEDICAL HISTORY: Past Medical History:  Diagnosis Date   Anemia 05/2019   iron infusions   Anxiety     Asthma    Cervical high risk human papillomavirus (HPV) DNA test positive 09/2015   Cholecystitis 2006   GALBLADDER REMOVED   Depression    Diverticulitis    Diverticulosis    Dysplastic nevus 08/24/2008   right upper back lat, right upper back medial   Fibroadenoma 2016   RIGHT BREAST   Head injury 06/25/2016   History of mammogram 11/2014   FIBROADENOMA   History of Papanicolaou smear of cervix 01/24/13; 10/15/15   -/-; -/+   Hypertension    Menorrhagia 02/01/2010   D/C HYSTEROSCOPY ENDO POLYP   MS (multiple sclerosis) ([place])  Ovarian cyst 9/15; 2015-2016   LEFT - WENT TO [place]; RIGHT   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    PAST SURGICAL HISTORY: Past Surgical History:  Procedure Laterality Date   BIOPSY  03/21/2019   Procedure: BIOPSY;  Surgeon: [name], MD;  Location: [facility];  Service: Gastroenterology;;   BREAST BIOPSY Right 2016   Fibroadenoma   CHOLECYSTECTOMY  2006   COLON RESECTION N/A 08/11/2019   Procedure: DIAGNOSTIC LAPAROSCOPY WITH [place] OUT, DRAIN PLACEMENT, AND LOOP ILEOSTOMY CREATION;  Surgeon: [name], MD;  Location: [facility];  Service: General;  Laterality: N/A;   COLONOSCOPY WITH PROPOFOL N/A 03/21/2019   Procedure: COLONOSCOPY WITH PROPOFOL;  Surgeon: [name], MD;  Location: [facility];  Service: Gastroenterology;  Laterality: N/A;  ultraslim colonoscope    CYSTOSCOPY WITH INJECTION N/A 08/04/2019   Procedure: FIREFLY INJECTION;  Surgeon: Ardis Hughs, MD;  Location: [facility];  Service: Urology;  Laterality: N/A;   DILATION AND CURETTAGE, DIAGNOSTIC / THERAPEUTIC  02/01/2010   D/C HYSTEROSCOPY ENDO POLYP; [facility]   ENDOSCOPIC MUCOSAL RESECTION  03/21/2019   Procedure: ENDOSCOPIC MUCOSAL RESECTION;  Surgeon: Rush Landmark Telford Nab., MD;  Location: [facility];  Service: Gastroenterology;;   ESOPHAGOGASTRODUODENOSCOPY (EGD) WITH PROPOFOL N/A 03/21/2019   Procedure: ESOPHAGOGASTRODUODENOSCOPY (EGD) WITH  PROPOFOL;  Surgeon: [name], MD;  Location: [facility];  Service: Gastroenterology;  Laterality: N/A;   EYE SURGERY     Lasik   FLEXIBLE SIGMOIDOSCOPY  07/31/2016   Procedure: FLEXIBLE SIGMOIDOSCOPY;  Surgeon: [name], MD;  Location[facility];  Service: Endoscopy;;   HEMOSTASIS CLIP PLACEMENT  03/21/2019   Procedure: HEMOSTASIS CLIP PLACEMENT;  Surgeon: [name], MD;  Location: [facility]COPY;  Service: Gastroenterology;;   HYSTEROSCOPY  02/01/2010   ENDO POLYP - [facility]   IR RADIOLOGIST EVAL & MGMT  11/16/2018   IR RADIOLOGIST EVAL & MGMT  09/06/2019   SCLEROTHERAPY  03/21/2019   Procedure: Clide Deutscher;  Surgeon: Mansouraty, Telford Nab., MD;  Location: [facility] ENDOSCOPY;  Service: Gastroenterology;;    FAMILY HISTORY: Family History  Problem Relation Age of Onset   Breast cancer Maternal Aunt 23       has contact   Rheum arthritis Mother    Hypertension Mother    Thyroid disease Mother        HYPOTHYROIDISM   Lung cancer Mother 29   Heart disease Father    Diabetes Father    Hypertension Father    Cancer Paternal Grandmother        MELANOMA OF SKIN   Cancer Cousin        KIDNEY-COUSIN   Colon cancer Neg Hx    Stomach cancer Neg Hx    Pancreatic cancer Neg Hx    Esophageal cancer Neg Hx    Inflammatory bowel disease Neg Hx    Rectal cancer Neg Hx     SOCIAL HISTORY:  Social History   Socioeconomic History   Marital status: Divorced    Spouse name: Not on file   Number of children: 0   Years of education: 14   Highest education level: Not on file  Occupational History    Employer: [employer]  Tobacco Use   Smoking status: Former    Years: 10.00    Types: Cigarettes    Quit date: 2010    Years since quitting: 12.7   Smokeless tobacco: Never  Vaping Use   Vaping Use: Never used  Substance and Sexual Activity   Alcohol use:  No   Drug use: No   Sexual activity: Not Currently    Partners: Male    Birth control/protection: I.U.D.     Comment: Mirena  Other Topics Concern   Not on file  Social History Narrative   Not on file   Social Determinants of Health   Financial Resource Strain: Not on file  Food Insecurity: Not on file  Transportation Needs: Not on file  Physical Activity: Not on file  Stress: Not on file  Social Connections: Not on file  Intimate Partner Violence: Not on file     PHYSICAL EXAM  Vitals:   12/27/20 1302  BP: 134/81  Pulse: 81  Weight: 234 lb (106.1 kg)  Height: 5\' 6"  (1.676 m)    Body mass index is 37.77 kg/m.   General: The patient is well-developed and well-nourished and in no acute distress.     Neurologic Exam  Mental status: The patient is alert and oriented x 3 at the time of the examination. The patient has apparent normal recent and remote memory, with an apparently normal attention span and concentration ability.   Speech is normal.  Cranial nerves: Extraocular muscles are normal.  Facial strength and sensation are normal.   Trapezius is strong  Hearing is normal   Motor:  Muscle bulk is normal.   Muscle tone and strength is normal in the arms and legs exept 4+/5 right APB strength  Other:   Currently does not have a right Tinel's and Phalen's signs as did last visit.  Sensory: She has intact sensation to touch and vibration in the arms but mildly altered sensation right LFCN distribution.   Coordination: Cerebellar testing reveals good finger-nose-finger and heel-to-shin bilaterally.  Gait and station: Station is normal.   The gait was normal.  Tandem gait is mildly wide.  Romberg is negative.  Reflexes: Deep tendon reflexes are symmetric and normal bilaterally in arms and legs.    No clonus or spread.        DIAGNOSTIC DATA (LABS, IMAGING, TESTING) - I reviewed patient records, labs, notes, testing and imaging myself where available.  Lab Results  Component Value Date   WBC 4.3 02/29/2020   HGB 10.9 (L) 02/29/2020   HCT 33.2 (L) 02/29/2020   MCV  85 02/29/2020   PLT 225 02/29/2020      Component Value Date/Time   NA 140 02/29/2020 0810   K 3.5 02/29/2020 0810   CL 103 02/29/2020 0810   CO2 26 02/29/2020 0810   GLUCOSE 78 02/29/2020 0810   GLUCOSE 110 (H) 12/06/2019 1653   BUN 11 02/29/2020 0810   CREATININE 0.69 02/29/2020 0810   CALCIUM 8.7 02/29/2020 0810   PROT 5.9 (L) 02/29/2020 0810   ALBUMIN 3.5 (L) 02/29/2020 0810   AST 17 02/29/2020 0810   ALT 8 02/29/2020 0810   ALKPHOS 96 02/29/2020 0810   BILITOT 1.0 02/29/2020 0810   GFRNONAA 105 02/29/2020 0810   GFRAA 121 02/29/2020 0810       ASSESSMENT AND PLAN  Multiple sclerosis (HCC) - Plan: CBC with Differential/Platelet, Hepatic function panel  Dysesthesia  Gait disturbance  Anxiety  Lateral femoral cutaneous neuropathy, right   1.    Continue Aubagio.    We will check lab work  2.    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  Continue  oxybutynin for bladder.      Continue vit D supplements.  3.    Continue to be active  and exercise as tolerated. 4.    She will return to see [place] in 6 months or sooner if there are new or worsening neurologic symptoms.     Tyesha Joffe A. Felecia Shelling, MD, PhD 09/27/4763, 4:65 PM Certified in Neurology, Clinical Neurophysiology, Sleep Medicine, Pain Medicine and Neuroimaging  [facility] [address], [place], [place] [address] [phone]

## 2020-12-28 LAB — HEPATIC FUNCTION PANEL
ALT: 19 IU/L (ref 0–32)
AST: 24 IU/L (ref 0–40)
Albumin: 4.2 g/dL (ref 3.8–4.8)
Alkaline Phosphatase: 130 IU/L — ABNORMAL HIGH (ref 44–121)
Bilirubin Total: 0.5 mg/dL (ref 0.0–1.2)
Bilirubin, Direct: 0.27 mg/dL (ref 0.00–0.40)
Total Protein: 6.8 g/dL (ref 6.0–8.5)

## 2020-12-28 LAB — CBC WITH DIFFERENTIAL/PLATELET
Basophils Absolute: 0.1 10*3/uL (ref 0.0–0.2)
Basos: 1 %
EOS (ABSOLUTE): 0.2 10*3/uL (ref 0.0–0.4)
Eos: 2 %
Hematocrit: 40.7 % (ref 34.0–46.6)
Hemoglobin: 13.2 g/dL (ref 11.1–15.9)
Immature Grans (Abs): 0 10*3/uL (ref 0.0–0.1)
Immature Granulocytes: 0 %
Lymphocytes Absolute: 2.2 10*3/uL (ref 0.7–3.1)
Lymphs: 26 %
MCH: 27.6 pg (ref 26.6–33.0)
MCHC: 32.4 g/dL (ref 31.5–35.7)
MCV: 85 fL (ref 79–97)
Monocytes Absolute: 0.9 10*3/uL (ref 0.1–0.9)
Monocytes: 10 %
Neutrophils Absolute: 5.1 10*3/uL (ref 1.4–7.0)
Neutrophils: 61 %
Platelets: 317 10*3/uL (ref 150–450)
RBC: 4.78 x10E6/uL (ref 3.77–5.28)
RDW: 14.4 % (ref 11.7–15.4)
WBC: 8.5 10*3/uL (ref 3.4–10.8)

## 2020-12-31 ENCOUNTER — Other Ambulatory Visit: Payer: Self-pay | Admitting: Neurology

## 2020-12-31 ENCOUNTER — Telehealth: Payer: Self-pay | Admitting: Neurology

## 2020-12-31 MED ORDER — LIDOCAINE 5 % EX PTCH
1.0000 | MEDICATED_PATCH | CUTANEOUS | 0 refills | Status: DC
Start: 2020-12-31 — End: 2020-12-31

## 2020-12-31 NOTE — Telephone Encounter (Signed)
Pt called, checking on if Dr. Felecia Shelling send out prescription for Lanacane patches for the tingling in my leg. This is not related to MS. Would like a call from the nurse.   Informed pt did not see anything in last office visit regarding Lanacare patches, but would send a note to the nurse.

## 2020-12-31 NOTE — Telephone Encounter (Signed)
Called pt back. Advised pt Dr. Felecia Shelling approved calling in lidocaine 5% patch #30, 1 patch onto the skin daily (no more than one per day). E-scribed to [employer] per pt request. Advised it may require PA via insurance. Pharmacy should notify us if needed. She verbalized understanding.

## 2020-12-31 NOTE — Addendum Note (Signed)
Addended by: Wyvonnia Lora on: 12/31/2020 03:03 PM   Modules accepted: Orders

## 2021-01-14 ENCOUNTER — Ambulatory Visit: Payer: BC Managed Care – PPO | Admitting: Dermatology

## 2021-02-12 ENCOUNTER — Other Ambulatory Visit: Payer: Self-pay

## 2021-02-12 MED ORDER — AUBAGIO 14 MG PO TABS: 1.0000 | ORAL_TABLET | Freq: Every day | ORAL | 11 refills | Status: DC

## 2021-03-04 ENCOUNTER — Ambulatory Visit: Payer: BC Managed Care – PPO | Admitting: Family Medicine

## 2021-04-17 ENCOUNTER — Other Ambulatory Visit: Payer: Self-pay

## 2021-04-17 ENCOUNTER — Ambulatory Visit: Payer: BC Managed Care – PPO | Admitting: Dermatology

## 2021-04-17 DIAGNOSIS — L853 Xerosis cutis: Secondary | ICD-10-CM | POA: Diagnosis not present

## 2021-04-17 DIAGNOSIS — L821 Other seborrheic keratosis: Secondary | ICD-10-CM

## 2021-04-17 DIAGNOSIS — D1800 Hemangioma unspecified site: Secondary | ICD-10-CM

## 2021-04-17 DIAGNOSIS — D1801 Hemangioma of skin and subcutaneous tissue: Secondary | ICD-10-CM | POA: Diagnosis not present

## 2021-04-17 DIAGNOSIS — Z86018 Personal history of other benign neoplasm: Secondary | ICD-10-CM

## 2021-04-17 DIAGNOSIS — Z1283 Encounter for screening for malignant neoplasm of skin: Secondary | ICD-10-CM | POA: Diagnosis not present

## 2021-04-17 DIAGNOSIS — L578 Other skin changes due to chronic exposure to nonionizing radiation: Secondary | ICD-10-CM

## 2021-04-17 DIAGNOSIS — L308 Other specified dermatitis: Secondary | ICD-10-CM | POA: Diagnosis not present

## 2021-04-17 DIAGNOSIS — L2089 Other atopic dermatitis: Secondary | ICD-10-CM

## 2021-04-17 DIAGNOSIS — D229 Melanocytic nevi, unspecified: Secondary | ICD-10-CM

## 2021-04-17 DIAGNOSIS — L814 Other melanin hyperpigmentation: Secondary | ICD-10-CM

## 2021-04-17 MED ORDER — MOMETASONE FUROATE 0.1 % EX CREA: 1.0000 "application " | TOPICAL_CREAM | Freq: Every day | CUTANEOUS | 1 refills | Status: DC | PRN

## 2021-04-17 NOTE — Patient Instructions (Addendum)
Recommend Cerave cream daily     If You Need Anything After Your Visit  If you have any questions or concerns for your doctor, please call our main line at [phone] and press option 4 to reach your doctor's medical assistant. If no one answers, please leave a voicemail as directed and we will return your call as soon as possible. Messages left after 4 pm will be answered the following business day.   You may also send [place] a message via [place]. We typically respond to MyChart messages within 1-2 business days.  For prescription refills, please ask your pharmacy to contact our office. Our fax number is [phone].  If you have an urgent issue when the clinic is closed that cannot wait until the next business day, you can page your doctor at the number below.    Please note that while we do our best to be available for urgent issues outside of office hours, we are not available 24/7.   If you have an urgent issue and are unable to reach [place], you may choose to seek medical care at your doctor's office, retail clinic, urgent care center, or emergency room.  If you have a medical emergency, please immediately call 911 or go to the emergency department.  Pager Numbers  - Dr. Nehemiah Massed: [phone]  - Dr. Laurence Ferrari: [phone]  - Dr. Nicole Kindred: [phone]  In the event of inclement weather, please call our main line at [phone] for an update on the status of any delays or closures.  Dermatology Medication Tips: Please keep the boxes that topical medications come in in order to help keep track of the instructions about where and how to use these. Pharmacies typically print the medication instructions only on the boxes and not directly on the medication tubes.   If your medication is too expensive, please contact our office at [phone] option 4 or send [place] a message through [place].   We are unable to tell what your co-pay for medications will be in advance as this is different  depending on your insurance coverage. However, we may be able to find a substitute medication at lower cost or fill out paperwork to get insurance to cover a needed medication.   If a prior authorization is required to get your medication covered by your insurance company, please allow [place] 1-2 business days to complete this process.  Drug prices often vary depending on where the prescription is filled and some pharmacies may offer cheaper prices.  The website www.goodrx.com contains coupons for medications through different pharmacies. The prices here do not account for what the cost may be with help from insurance (it may be cheaper with your insurance), but the website can give you the price if you did not use any insurance.  - You can print the associated coupon and take it with your prescription to the pharmacy.  - You may also stop by our office during regular business hours and pick up a GoodRx coupon card.  - If you need your prescription sent electronically to a different pharmacy, notify our office through [facility] or by phone at [phone] option 4.     Si Usted Necesita Algo Despus de Su Visita  Tambin puede enviarnos un mensaje a travs de Pharmacist, community. Por lo general respondemos a los mensajes de MyChart en el transcurso de 1 a 2 das hbiles.  Para renovar recetas, por favor pida a su farmacia que se ponga en contacto con nuestra oficina. Oswald Hillock nmero  de fax es el [phone].  Si tiene un asunto urgente cuando la clnica est cerrada y que no puede esperar hasta el siguiente da hbil, puede llamar/localizar a su doctor(a) al nmero que aparece a continuacin.   Por favor, tenga en cuenta que aunque hacemos todo lo posible para estar disponibles para asuntos urgentes fuera del horario de [place], no estamos disponibles las 24 horas del da, los 7 das de la [place].   Si tiene un problema urgente y no puede comunicarse con nosotros, puede optar por buscar atencin  mdica  en el consultorio de su doctor(a), en una clnica privada, en un centro de atencin urgente o en una sala de emergencias.  Si tiene Engineering geologist, por favor llame inmediatamente al 911 o vaya a la sala de emergencias.  Nmeros de bper  - Dr. Nehemiah Massed: [phone]  - Dra. Moye: [phone]  - Dra. Nicole Kindred: [phone]  En caso de inclemencias del [place], por favor llame a Johnsie Kindred principal al [phone] para una actualizacin sobre el [place] de cualquier retraso o cierre.  Consejos para la medicacin en dermatologa: Por favor, guarde las cajas en las que vienen los medicamentos de uso tpico para ayudarle a seguir las instrucciones sobre dnde y cmo usarlos. Las farmacias generalmente imprimen las instrucciones del medicamento slo en las cajas y no directamente en los tubos del [place].   Si su medicamento es muy caro, por favor, pngase en contacto con Zigmund Daniel llamando al [phone] y presione la opcin 4 o envenos un mensaje a travs de Pharmacist, community.   No podemos decirle cul ser su copago por los medicamentos por adelantado ya que esto es diferente dependiendo de la cobertura de su seguro. Sin embargo, es posible que podamos encontrar un medicamento sustituto a Electrical engineer un formulario para que el seguro cubra el medicamento que se considera necesario.   Si se requiere una autorizacin previa para que su compaa de seguros [place] su medicamento, por favor permtanos de 1 a 2 das hbiles para completar este proceso.  Los precios de los medicamentos varan con frecuencia dependiendo del Environmental consultant de dnde se surte la receta y alguna farmacias pueden ofrecer precios ms baratos.  El sitio web www.goodrx.com tiene cupones para medicamentos de Airline pilot. Los precios aqu no tienen en cuenta lo que podra costar con la ayuda del seguro (puede ser ms barato con su seguro), pero el sitio web puede darle el precio si no utiliz Conservation officer, historic buildings.  - Puede imprimir el cupn correspondiente y llevarlo con su receta a la farmacia.  - Tambin puede pasar por nuestra oficina durante el horario de atencin regular y Charity fundraiser una tarjeta de cupones de GoodRx.  - Si necesita que su receta se enve electrnicamente a una farmacia diferente, informe a nuestra oficina a travs de MyChart de [place] o por telfono llamando al [phone] y presione la opcin 4.

## 2021-04-17 NOTE — Progress Notes (Signed)
° °  Follow-Up Visit   Subjective  Sandra Bonilla is a 46 y.o. female who presents for the following: Annual Exam (History of dysplastic nevus - TBSE today). The patient presents for Total-Body Skin Exam (TBSE) for skin cancer screening and mole check.  The patient has spots, moles and lesions to be evaluated, some may be new or changing and the patient has concerns that these could be cancer.  The following portions of the chart were reviewed this encounter and updated as appropriate:   Tobacco   Allergies   Meds   Problems   Med Hx   Surg Hx   Fam Hx      Review of Systems:  No other skin or systemic complaints except as noted in HPI or Assessment and Plan.  Objective  Well appearing patient in no apparent distress; mood and affect are within normal limits.  A full examination was performed including scalp, head, eyes, ears, nose, lips, neck, chest, axillae, abdomen, back, buttocks, bilateral upper extremities, bilateral lower extremities, hands, feet, fingers, toes, fingernails, and toenails. All findings within normal limits unless otherwise noted below.  Legs Xerosis  Right lower leg, hands Pink patches   Assessment & Plan   Lentigines - Scattered tan macules - Due to sun exposure - Benign-appearing, observe - Recommend daily broad spectrum sunscreen SPF 30+ to sun-exposed areas, reapply every 2 hours as needed. - Call for any changes  Seborrheic Keratoses - Stuck-on, waxy, tan-brown papules and/or plaques  - Benign-appearing - Discussed benign etiology and prognosis. - Observe - Call for any changes  Melanocytic Nevi - Tan-brown and/or pink-flesh-colored symmetric macules and papules - Benign appearing on exam today - Observation - Call clinic for new or changing moles - Recommend daily use of broad spectrum spf 30+ sunscreen to sun-exposed areas.   Hemangiomas - Red papules - Discussed benign nature - Observe - Call for any changes  Actinic Damage -  Chronic condition, secondary to cumulative UV/sun exposure - diffuse scaly erythematous macules with underlying dyspigmentation - Recommend daily broad spectrum sunscreen SPF 30+ to sun-exposed areas, reapply every 2 hours as needed.  - Staying in the shade or wearing long sleeves, sun glasses (UVA+UVB protection) and wide brim hats (4-inch brim around the entire circumference of the hat) are also recommended for sun protection.  - Call for new or changing lesions.  Skin cancer screening performed today.  Congenital hemangioma Sacral area Benign  Xerosis cutis Legs Recommend Cerave cream daily  Other eczema Right lower leg, hands Atopic dermatitis (eczema) is a chronic, relapsing, pruritic condition that can significantly affect quality of life. It is often associated with allergic rhinitis and/or asthma and can require treatment with topical medications, phototherapy, or in severe cases biologic injectable medication (Dupixent; Adbry) or Oral JAK inhibitors.  Start Mometasone cream qd x 2 weeks then decrease to 5 times per week  mometasone (ELOCON) 0.1 % cream - Right lower leg, hands Apply 1 application topically daily as needed (Rash). Up to 5 times per week  Skin cancer screening  Return in about 1 year (around 04/17/2022) for TBSE.  I, Ashok Cordia, CMA, am acting as scribe for Sarina Ser, MD . Documentation: I have reviewed the above documentation for accuracy and completeness, and I agree with the above.  Sarina Ser, MD

## 2021-04-18 ENCOUNTER — Encounter: Payer: Self-pay | Admitting: Dermatology

## 2021-05-15 ENCOUNTER — Other Ambulatory Visit: Payer: Self-pay

## 2021-05-15 ENCOUNTER — Encounter: Payer: Self-pay | Admitting: Obstetrics and Gynecology

## 2021-05-15 ENCOUNTER — Ambulatory Visit (INDEPENDENT_AMBULATORY_CARE_PROVIDER_SITE_OTHER): Payer: BC Managed Care – PPO | Admitting: Obstetrics and Gynecology

## 2021-05-15 VITALS — BP 120/80 | Ht 66.0 in | Wt 248.0 lb

## 2021-05-15 DIAGNOSIS — Z1151 Encounter for screening for human papillomavirus (HPV): Secondary | ICD-10-CM

## 2021-05-15 DIAGNOSIS — Z124 Encounter for screening for malignant neoplasm of cervix: Secondary | ICD-10-CM

## 2021-05-15 DIAGNOSIS — Z30431 Encounter for routine checking of intrauterine contraceptive device: Secondary | ICD-10-CM

## 2021-05-15 DIAGNOSIS — Z01419 Encounter for gynecological examination (general) (routine) without abnormal findings: Secondary | ICD-10-CM | POA: Diagnosis not present

## 2021-05-15 DIAGNOSIS — Z8742 Personal history of other diseases of the female genital tract: Secondary | ICD-10-CM | POA: Diagnosis not present

## 2021-05-15 DIAGNOSIS — Z1231 Encounter for screening mammogram for malignant neoplasm of breast: Secondary | ICD-10-CM

## 2021-05-15 DIAGNOSIS — T8332XS Displacement of intrauterine contraceptive device, sequela: Secondary | ICD-10-CM

## 2021-05-15 NOTE — Progress Notes (Signed)
Chief Complaint  Patient presents with   Gynecologic Exam    No concerns     HPI:      [name] is a 46 y.o. G0P0000 who LMP was Patient's last menstrual period was 05/10/2021 (exact date)., presents today for her annual examination.  Her menses are now monthly with IUD, lasting 5-7 days, mod flow with small clots, no BTB. Dysmenorrhea none. Bleeding was irregular spotting initially but getting more each month now.   Sex activity: not currently. Has Mirena, replaced 11/14/15. Strings have been lost, IUD in place on 7/20 CT scan.  Last Pap: 05/14/20 Results were neg cells/neg HPV DNA  01/26/19 neg cells, POS HPV DNA.  12/01/17 ASCUS/neg HPV DNA.  07/01/17  no abnormalities /neg HPV DNA.  10/15/16 pap was LGSIL/POS HPV DNA. Neg colpo and bx 9/18.  She is due for repeat pap today. She is also on immune modulator med for MS and should have yearly paps.  Hx of STDs: HPV  Last mammogram: 05/29/20 Results were: normal--routine follow-up in 12 months There is a  FH of breast cancer in her mat aunt, genetic testing not indicated. There is no FH of ovarian cancer. The patient does do self-breast exams.  Tobacco use: The patient denies current or previous tobacco use. Alcohol use: none  No drug use. 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 Hx short term ileostomy/3 major surgeries  She does not get adequate calcium but does get Vitamin D in her diet.  Labs with PCP.  Has MS, doing well.   Past Medical History:  Diagnosis Date   Anemia 05/2019   iron infusions   Anxiety    Asthma    Cervical high risk human papillomavirus (HPV) DNA test positive 09/2015   Cholecystitis 2006   GALBLADDER REMOVED   Depression    Diverticulitis    Diverticulosis    Dysplastic nevus 08/24/2008   right upper back lat, right upper back medial   Fibroadenoma 2016   RIGHT BREAST   Head injury 06/25/2016    History of mammogram 11/2014   FIBROADENOMA   History of Papanicolaou smear of cervix 01/24/13; 10/15/15   -/-; -/+   Hypertension    Menorrhagia 02/01/2010   D/C HYSTEROSCOPY ENDO POLYP   MS (multiple sclerosis) ([place])    Ovarian cyst 9/15; 2015-2016   LEFT - WENT TO [facility]; RIGHT   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    Past Surgical History:  Procedure Laterality Date   BIOPSY  03/21/2019   Procedure: BIOPSY;  Surgeon: [name], MD;  Location: [place];  Service: Gastroenterology;;   BREAST BIOPSY Right 2016   Fibroadenoma   CHOLECYSTECTOMY  2006   COLON RESECTION N/A 08/11/2019   Procedure: DIAGNOSTIC LAPAROSCOPY WITH [place] OUT, DRAIN PLACEMENT, AND LOOP ILEOSTOMY CREATION;  Surgeon: [name], MD;  [facility];  Service: General;  Laterality: N/A;   COLONOSCOPY WITH PROPOFOL N/A 03/21/2019   Procedure: COLONOSCOPY WITH PROPOFOL;  Surgeon: [name], MD;  Location: [facility] ENDOSCOPY;  Service: Gastroenterology;  Laterality: N/A;  ultraslim colonoscope    CYSTOSCOPY WITH INJECTION N/A 08/04/2019   Procedure: FIREFLY INJECTION;  Surgeon: Ardis Hughs, MD;  [facility];  Service: Urology;  Laterality: N/A;   DILATION AND CURETTAGE, DIAGNOSTIC / THERAPEUTIC  02/01/2010   D/C HYSTEROSCOPY ENDO POLYP; [facility]   ENDOSCOPIC MUCOSAL RESECTION  03/21/2019   Procedure:  ENDOSCOPIC MUCOSAL RESECTION;  Surgeon: Rush Landmark Telford Nab., MD;  Location: [place];  Service: Gastroenterology;;   ESOPHAGOGASTRODUODENOSCOPY (EGD) WITH PROPOFOL N/A 03/21/2019   Procedure: ESOPHAGOGASTRODUODENOSCOPY (EGD) WITH PROPOFOL;  Surgeon: [name], MD;  Location: [place];  Service: Gastroenterology;  Laterality: N/A;   EYE SURGERY     Lasik   FLEXIBLE SIGMOIDOSCOPY  07/31/2016   Procedure: FLEXIBLE SIGMOIDOSCOPY;  Surgeon: [name], MD;  [facility];  Service: Endoscopy;;   HEMOSTASIS CLIP PLACEMENT  03/21/2019    Procedure: HEMOSTASIS CLIP PLACEMENT;  Surgeon: [name], MD;  Location: [facility] ENDOSCOPY;  Service: Gastroenterology;;   HYSTEROSCOPY  02/01/2010   ENDO POLYP - [facility]   IR RADIOLOGIST EVAL & MGMT  11/16/2018   IR RADIOLOGIST EVAL & MGMT  09/06/2019   SCLEROTHERAPY  03/21/2019   Procedure: Clide Deutscher;  Surgeon: Mansouraty, Telford Nab., MD;  Location: [facility];  Service: Gastroenterology;;    Family History  Problem Relation Age of Onset   Breast cancer Maternal Aunt 25       has contact   Rheum arthritis Mother    Hypertension Mother    Thyroid disease Mother        HYPOTHYROIDISM   Lung cancer Mother 13   Heart disease Father    Diabetes Father    Hypertension Father    Cancer Paternal Grandmother        MELANOMA OF SKIN   Cancer Cousin        KIDNEY-COUSIN   Colon cancer Neg Hx    Stomach cancer Neg Hx    Pancreatic cancer Neg Hx    Esophageal cancer Neg Hx    Inflammatory bowel disease Neg Hx    Rectal cancer Neg Hx     Social History   Socioeconomic History   Marital status: Divorced    Spouse name: Not on file   Number of children: 0   Years of education: 14   Highest education level: Not on file  Occupational History    Employer: [employer]  Tobacco Use   Smoking status: Former    Years: 10.00    Types: Cigarettes    Quit date: 2010    Years since quitting: 13.1   Smokeless tobacco: Never  Vaping Use   Vaping Use: Never used  Substance and Sexual Activity   Alcohol use: No   Drug use: No   Sexual activity: Yes    Partners: Male    Birth control/protection: I.U.D.    Comment: Mirena  Other Topics Concern   Not on file  Social History Narrative   Not on file   Social Determinants of Health   Financial Resource Strain: Not on file  Food Insecurity: Not on file  Transportation Needs: Not on file  Physical Activity: Not on file  Stress: Not on file  Social Connections: Not on file  Intimate Partner Violence: Not on file      Current Outpatient Medications:    acetaminophen (TYLENOL) 325 MG tablet, Take 2 tablets (650 mg total) by mouth every 6 (six) hours as needed for mild pain (or Fever >/= 101)., Disp: , Rfl:    busPIRone (BUSPAR) 7.5 MG tablet, Take 7.5 mg by mouth 2 (two) times daily., Disp: , Rfl:    celecoxib (CELEBREX) 200 MG capsule, SMARTSIG:2 Capsule(s) By Mouth Daily PRN, Disp: , Rfl:    chlorthalidone (HYGROTON) 25 MG tablet, Take 12.5 mg by mouth every morning., Disp: , Rfl:    cholecalciferol (VITAMIN D3) 25  MCG (1000 UT) tablet, Take 1,000 Units by mouth daily., Disp: , Rfl:    cyclobenzaprine (FLEXERIL) 5 MG tablet, Take 1 tablet (5 mg total) by mouth at bedtime as needed. (Patient taking differently: Take 5 mg by mouth at bedtime as needed for muscle spasms.), Disp: 30 tablet, Rfl: 11   docusate sodium (COLACE) 100 MG capsule, Take 100 mg by mouth every morning., Disp: , Rfl:    gabapentin (NEURONTIN) 300 MG capsule, One po qAM, one po qPM and two po qHS, Disp: 120 capsule, Rfl: 11   Ibuprofen-Acetaminophen 125-250 MG TABS, Take by mouth., Disp: , Rfl:    levonorgestrel (MIRENA) 20 MCG/24HR IUD, 1 each by Intrauterine route once., Disp: , Rfl:    lidocaine (LIDODERM) 5 %, PLACE 1 PATCH ONTO SKIN DAILY. REMOVE AND DISCARD PATCH WITHIN 12 HOURS OR AS DIRECTED BY MD, Disp: 90 patch, Rfl: 0   LORazepam (ATIVAN) 1 MG tablet, Take 1 mg by mouth 2 (two) times daily as needed for anxiety. , Disp: , Rfl:    meloxicam (MOBIC) 15 MG tablet, Take 1 tablet (15 mg total) by mouth daily. (Patient taking differently: Take 15 mg by mouth daily as needed for pain.), Disp: 30 tablet, Rfl: 5   mometasone (ELOCON) 0.1 % cream, Apply 1 application topically daily as needed (Rash). Up to 5 times per week, Disp: 45 g, Rfl: 1   ondansetron (ZOFRAN-ODT) 4 MG disintegrating tablet, Take 4 mg by mouth every 6 (six) hours as needed for nausea or vomiting. , Disp: , Rfl:    oxybutynin (DITROPAN) 5 MG tablet, TAKE 1 TABLET BY  MOUTH  TWICE DAILY, Disp: 180 tablet, Rfl: 3   Phendimetrazine Tartrate 35 MG TABS, Take 1 tablet by mouth 3 (three) times daily., Disp: , Rfl:    Polyvinyl Alcohol-Povidone (REFRESH OP), Place 2 drops into both eyes 4 (four) times daily as needed (dryness)., Disp: , Rfl:    Probiotic Product (PROBIOTIC PO), Take 1 capsule by mouth daily., Disp: , Rfl:    Teriflunomide (AUBAGIO) 14 MG TABS, Take 1 tablet by mouth daily., Disp: 30 tablet, Rfl: 11   TRELEGY ELLIPTA 200-62.5-25 MCG/ACT AEPB, Inhale 1 puff into the lungs daily., Disp: , Rfl:   ROS:  Review of Systems  Constitutional:  Negative for fatigue, fever and unexpected weight change.  Respiratory:  Negative for cough, shortness of breath and wheezing.   Cardiovascular:  Negative for chest pain, palpitations and leg swelling.  Gastrointestinal:  Negative for blood in stool, constipation, diarrhea, nausea and vomiting.  Endocrine: Negative for cold intolerance, heat intolerance and polyuria.  Genitourinary:  Negative for dyspareunia, dysuria, flank pain, frequency, genital sores, hematuria, menstrual problem, pelvic pain, urgency, vaginal bleeding, vaginal discharge and vaginal pain.  Musculoskeletal:  Positive for arthralgias. Negative for back pain, joint swelling and myalgias.  Skin:  Negative for rash.  Neurological:  Negative for dizziness, syncope, light-headedness, numbness and headaches.  Hematological:  Negative for adenopathy.  Psychiatric/Behavioral:  Positive for agitation. Negative for confusion, dysphoric mood, sleep disturbance and suicidal ideas. The patient is not nervous/anxious.     Objective: BP 120/80    Ht 5\' 6"  (1.676 m)    Wt 248 lb (112.5 kg)    LMP 05/10/2021 (Exact Date)    BMI 40.03 kg/m    Physical Exam Constitutional:      Appearance: She is well-developed.  Genitourinary:     Vulva normal.     Genitourinary Comments: IUD STRINGS NOT SEEN IN CX OS  Right Labia: No rash, tenderness or lesions.     Left Labia: No tenderness, lesions or rash.    No vaginal discharge, erythema or tenderness.      Right Adnexa: not tender and no mass present.    Left Adnexa: not tender and no mass present.    No cervical motion tenderness, friability or polyp.     No IUD strings visualized.     Uterus is not enlarged or tender.  Breasts:    Right: No mass, nipple discharge, skin change or tenderness.     Left: No mass, nipple discharge, skin change or tenderness.  Neck:     Thyroid: No thyromegaly.  Cardiovascular:     Rate and Rhythm: Normal rate and regular rhythm.     Heart sounds: Normal heart sounds. No murmur heard. Pulmonary:     Effort: Pulmonary effort is normal.     Breath sounds: Normal breath sounds.  Abdominal:     Palpations: Abdomen is soft.     Tenderness: There is no abdominal tenderness. There is no guarding or rebound.  Musculoskeletal:        General: Normal range of motion.     Cervical back: Normal range of motion.  Lymphadenopathy:     Cervical: No cervical adenopathy.  Neurological:     General: No focal deficit present.     Mental Status: She is alert and oriented to person, place, and time.     Cranial Nerves: No cranial nerve deficit.  Skin:    General: Skin is warm and dry.  Psychiatric:        Mood and Affect: Mood normal.        Behavior: Behavior normal.        Thought Content: Thought content normal.        Judgment: Judgment normal.  Vitals reviewed.     Assessment/Plan: Encounter for annual routine gynecological examination  Cervical cancer screening - Plan: IGP, Aptima HPV,   Screening for HPV (human papillomavirus) - Plan: IGP, Aptima HPV,   History of abnormal cervical Pap smear - Plan: IGP, Aptima HPV,   Encounter for screening mammogram for malignant neoplasm of breast - Plan: MM 3D SCREEN BREAST BILATERAL; pt to schedule mammo  Encounter for routine checking of intrauterine contraceptive device (IUD); IUD placed 11/14/15; has 8 yr  indication but pt having regular bleeding now, will replace IUD with next menses to see if bleeding improves.   Intrauterine contraceptive device threads lost, sequela; IUD placement confirmed on CT scan in past.     GYN counsel mammography screening, adequate intake of calcium and vitamin D, diet and exercise     F/U  Return in about 1 year (around 05/15/2022).  Dhanush Jokerst B. Zachariah Pavek, PA-C 05/15/2021 4:51 PM

## 2021-05-15 NOTE — Patient Instructions (Signed)
I value your feedback and you entrusting us with your care. If you get a [facility] patient survey, I would appreciate you taking the time to let us know about your experience today. Thank you! ? ? ?

## 2021-05-19 LAB — IGP, APTIMA HPV: HPV Aptima: NEGATIVE

## 2021-05-28 ENCOUNTER — Other Ambulatory Visit: Payer: Self-pay | Admitting: Neurology

## 2021-05-28 MED ORDER — LIDOCAINE 5 % EX PTCH: 1.0000 | MEDICATED_PATCH | Freq: Every day | CUTANEOUS | 0 refills | Status: AC

## 2021-06-09 IMAGING — RF ABSCESS INJECTION
10 series · 14 of 25 positions shown · non-contrast
Comparison: none

INDICATION: 43-year-old with history of diverticular abscess and status post
percutaneous drainage catheter. Patient reports minimal output from
the drain. Recent CT demonstrates that the large abscess collection
in the lower abdomen has resolved around the percutaneous drain.

[Series 1: one shot · 1 of 1 slices shown (1 of 4)]
[im 1/1]
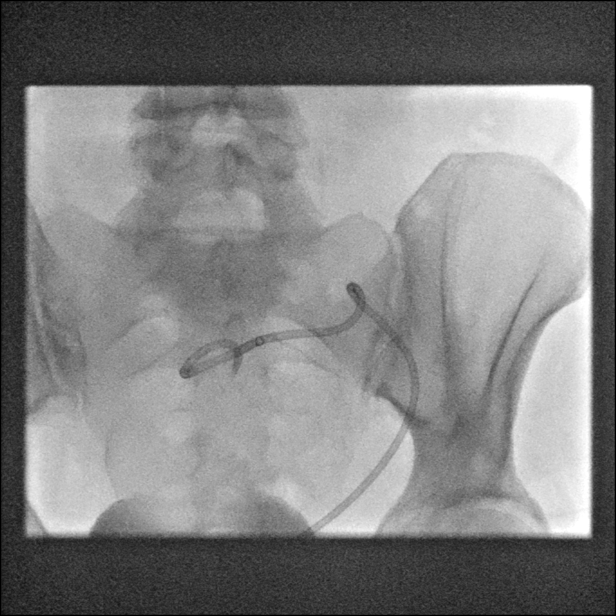

[Series 2: sequence · 2 of 12 frames shown (1 of 6)]
[frame 7/12]
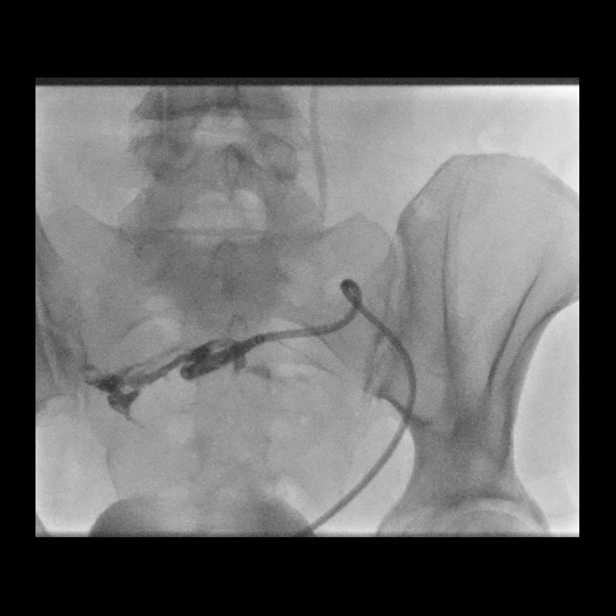
[frame 11/12]
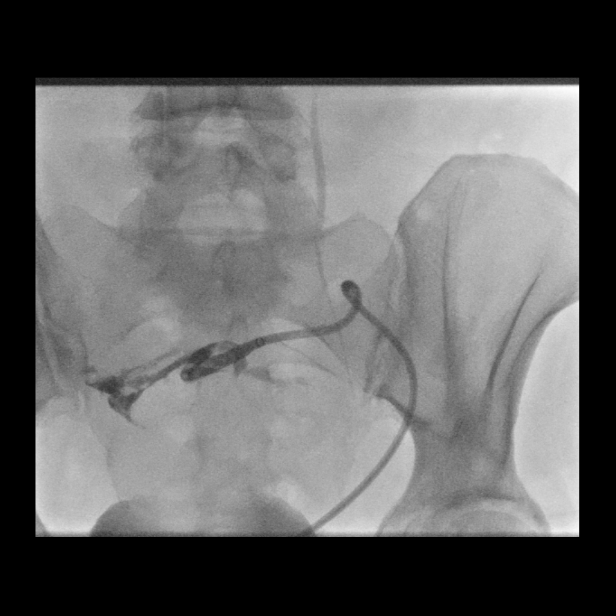

[Series 3: sequence · 2 of 14 frames shown (2 of 6)]
[frame 4/14]
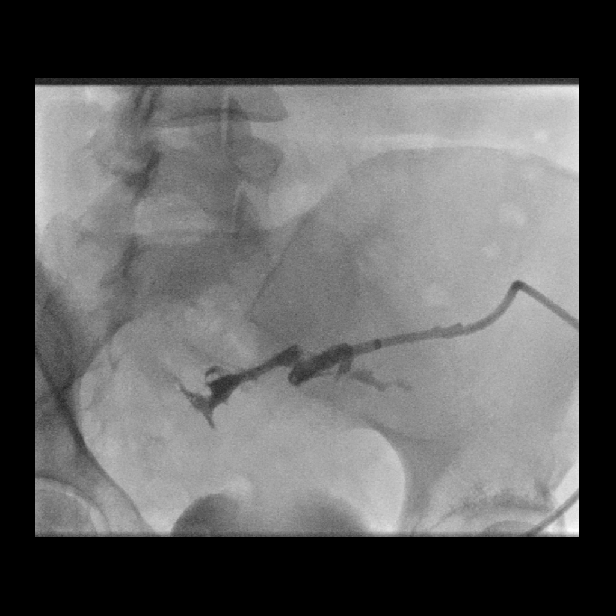
[frame 12/14]
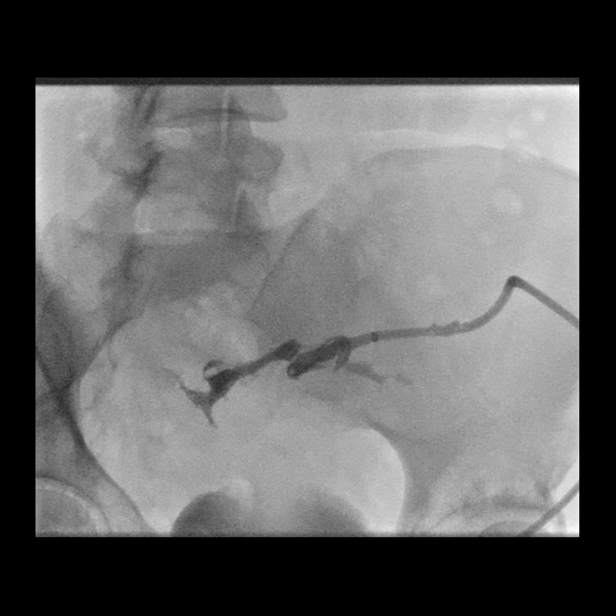

[Series 4: one shot · 1 of 1 slices shown (2 of 4)]
[im 1/1]
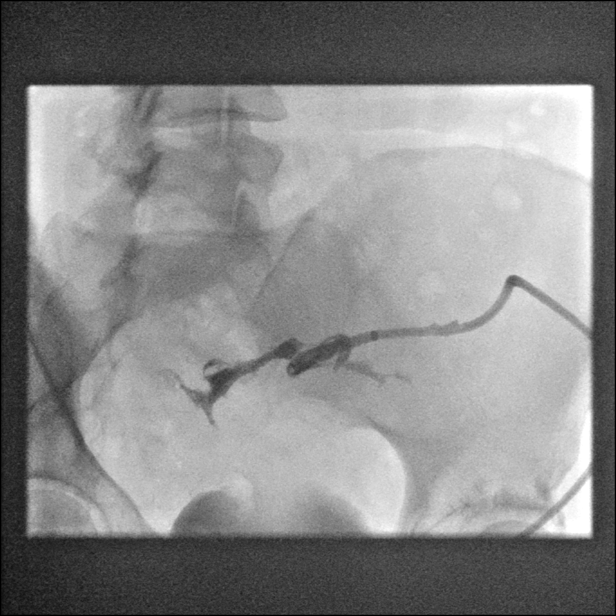

[Series 5: sequence · 2 of 12 frames shown (3 of 6)]
[frame 2/12]
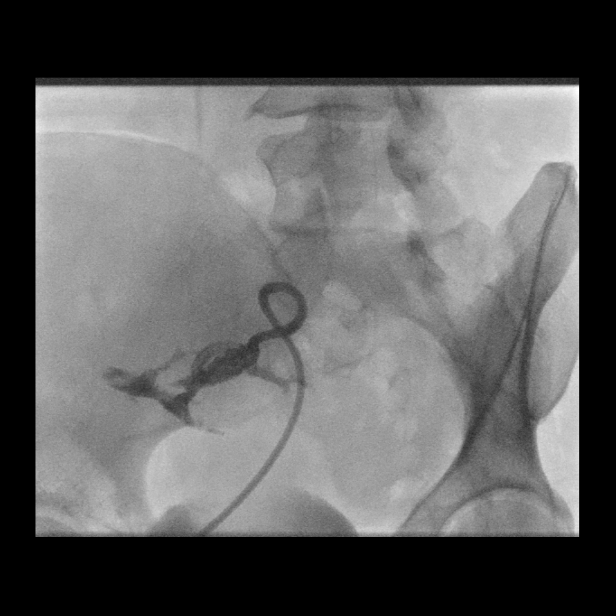
[frame 11/12]
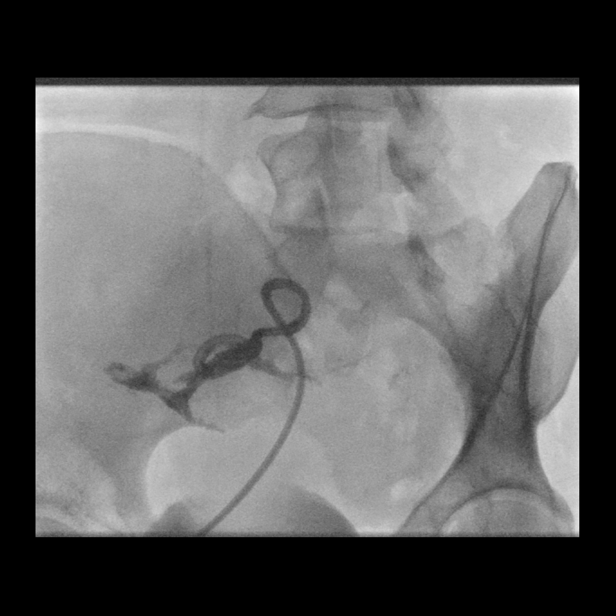

[Series 6: one shot · 2 of 3 slices shown (3 of 4)]
[im 2/3]
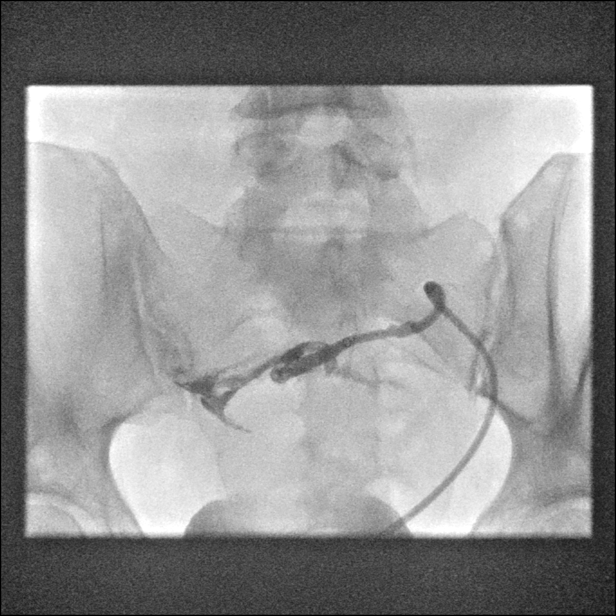
[im 3/3]
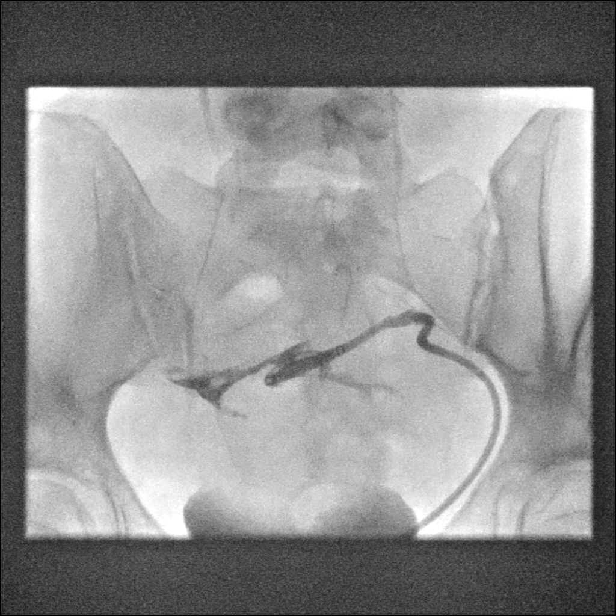

[Series 7: sequence · 1 of 5 frames shown (4 of 6)]
[frame 3/5]
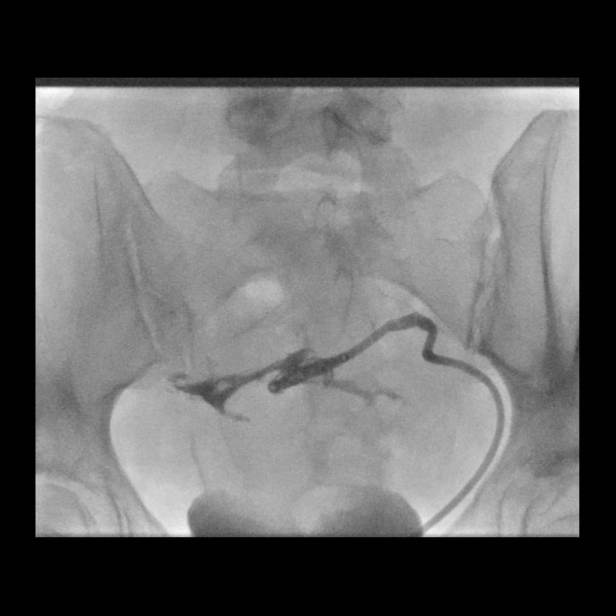

[Series 8: sequence · 1 of 2 frames shown (5 of 6)]
[frame 1/2]
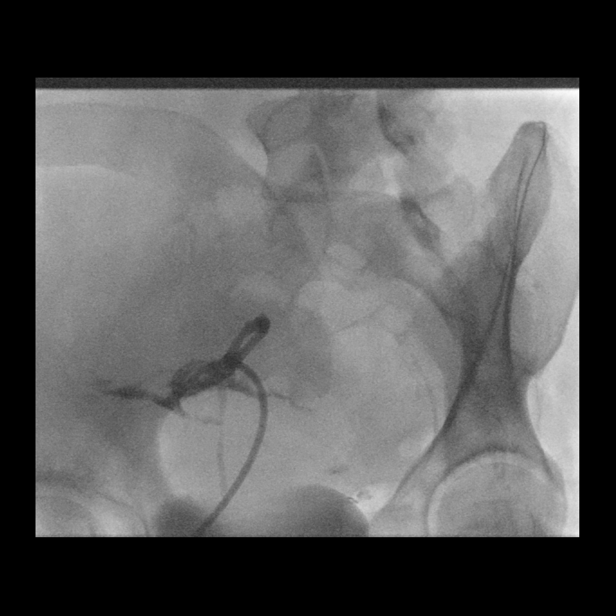

[Series 9: sequence · 1 of 2 frames shown (6 of 6)]
[frame 1/2]
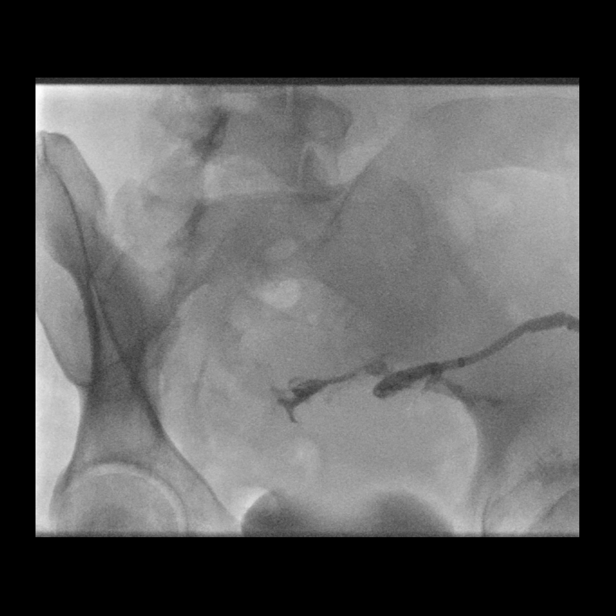

[Series 10: one shot · 0.15mm/px · 1 of 1 slices shown (4 of 4)]
[im 1/1]
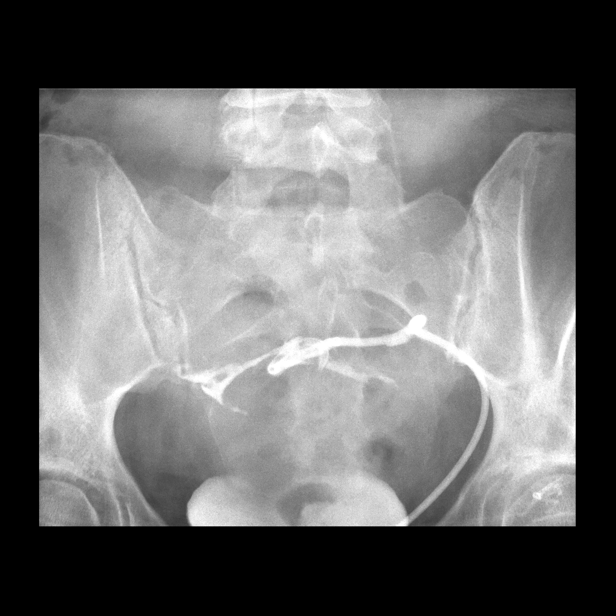

[14 of 25 positions shown; findings below may reference images not displayed]

EXAM:
DRAIN INJECTION WITH FLUOROSCOPY

MEDICATIONS:
None

ANESTHESIA/SEDATION:
None

COMPLICATIONS:
None immediate.

Fluoroscopy time, 1 minutes, 6 seconds, 126 mGy

PROCEDURE:
Patient was placed supine on the fluoroscopic table. Contrast was
injected through the percutaneous drain with fluoroscopic guidance.
10 mL Omnipaque 300 was injected. Attempted to aspirate the contrast
at end the procedure.

The retention suture was removed. The catheter was cut and
completely removed without complication. Bandage placed at the old
drain site.
FINDINGS: Drainage catheter in the lower abdomen. Contrast fills small
irregular cavities in the lower peritoneal cavity but there is no
evidence for a bowel fistula.
IMPRESSION: 1. Negative for a bowel fistula.
2. Contrast fills small irregular collections in the pelvis which
appear to be around loops of bowel. No residual abscess collection
around the drain. Drainage catheter was removed.

## 2021-06-24 ENCOUNTER — Ambulatory Visit: Payer: BC Managed Care – PPO | Admitting: Family Medicine

## 2021-06-24 ENCOUNTER — Encounter: Payer: Self-pay | Admitting: Family Medicine

## 2021-06-24 VITALS — BP 143/81 | HR 75 | Ht 66.0 in | Wt 246.0 lb

## 2021-06-24 DIAGNOSIS — G35 Multiple sclerosis: Secondary | ICD-10-CM

## 2021-06-24 DIAGNOSIS — R269 Unspecified abnormalities of gait and mobility: Secondary | ICD-10-CM | POA: Diagnosis not present

## 2021-06-24 DIAGNOSIS — R208 Other disturbances of skin sensation: Secondary | ICD-10-CM

## 2021-06-24 DIAGNOSIS — R350 Frequency of micturition: Secondary | ICD-10-CM

## 2021-06-24 DIAGNOSIS — F418 Other specified anxiety disorders: Secondary | ICD-10-CM

## 2021-06-24 MED ORDER — MIRABEGRON ER 25 MG PO TB24: 25.0000 mg | ORAL_TABLET | Freq: Every day | ORAL | 1 refills | Status: DC

## 2021-06-24 NOTE — Patient Instructions (Addendum)
Below is our plan: ? ?We will continue Aubagio. We will switch oxybutynin to Myrbetriq. Take '25mg'$  daily. We may increase to '50mg'$  daily if well tolerated.  ? ?Please make sure you are staying well hydrated. I recommend 50-60 ounces daily. Well balanced diet and regular exercise encouraged. Consistent sleep schedule with 6-8 hours recommended.  ? ?Please continue follow up with care team as directed.  ? ?Follow up with Dr Felecia Shelling in 6 months  ? ?You may receive a survey regarding today's visit. I encourage you to leave honest feed back as I do use this information to improve patient care. Thank you for seeing me today!  ? ? ?

## 2021-06-24 NOTE — Progress Notes (Signed)
? ? ?Chief Complaint  ?Patient presents with  ? Follow-up  ?  Rm 1, alone. Here for 6 month MS f/u, on Aubagio and tolerating well. MS stable. No new or worsening in sx. Pt states she continues to have urinary incontinence. Is taking oxybutynin. Continues to have trouble w balance, no falls.   ? ? ? ?HISTORY OF PRESENT ILLNESS: ? ?06/24/21 ALL: ?Sandra Bonilla is a 46 y.o. female here today for follow up for RRMS. She continues Aubagio and tolerating well. Labs have been stable. Last MRI brain stable in 02/2020. ? ?She feels that she is doing fairly well. She denies new or exacerbating symptoms. She feels gait is stable. Some days she feels a little off balance. No falls. She does not use an assistive device.  ? ?Gabapentin helps with shooting pain in legs. She is taking gabapentin up to 300/300/600. Usually she can take '300mg'$  in the am and '300mg'$  in the pm. She has not needed meloxicam or cyclobenzaprine recently. She only takes these meds when pain is really bad. She does feel that lidocaine patches help. She does take Celebrex daily prescribed by PCP for back pain.  ? ?Mood is good. Ativan and buspirone help with anxiety prescribed by PCP. She is sleeping fairly well. She has tried melatonin that helps. She has tried sleep aids in the past but they make her too groggy.  ? ?She continues oxybutynin BID for urinary frequency. She is not sure it has helped much. She does have some urge incontinence.  ? ? ?HISTORY (copied from Dr Garth Bigness previous note) ? ?Sandra Bonilla is a 46 y.o. 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    ?  ?Update 12/27/2020: ?She feels her MS is mostly stable and she denies any exacerbation.   MRI 02/2020 showed no new lesions.  She is on Aubagio and tolerates t well.    She is noting more symptoms in her right hand.   She is noting pain in her right anterolateral thigh since a couple months ago.  Pain fluctuates and is sometimes severe.  It bothers her both day and night.     She is on  gabapentin long term but just 100 mg po qHS.    ?  ?Gait is ok.  Sometimes she feels she veers to the right.   No falls.   Besides the ight hand/arm, no other numbness or weakness.    She has urinary frequency and is on oxybutynin.  It is helping.  No UTI's.       ?  ?Fatigue has been worse the past year.  She had abscesses and colon surgery.    She has insomnia but sleep aids cause a hangover so she is not taking anything now.    ?  ?She denies depression but has anxiety.   No major cognitive issues but occasioanal brain fog. .   She is on Buspar and lorazepam for the anxiety.   ?  ?She had bowel surgery and required a short term ed colostomy (she had diverticulosis).    ?  ?  ?MS HIstory:       About  2006, she noted gait changes and her right foot was clumsy.    She had an MRI consistent with MS and was referred to Dr. Jacolyn Reedy [facility]) who diagnosed her with MS.   She then saw Dr. Felipe Drone and then Dr. Deloria Lair at [facility].  She was reluctant to take a medication until  2012 years ago when she  started Copaxone.   She had skin reactions, she stopped after one year and switched to Aubagio (+/- late 2013).   She has tolerated it well.    ?  ?Imaging ?MRI brain 03/06/2020:  Multiple T2/FLAIR hyperintense foci in the periventricular, juxtacortical and deep white matter of both hemispheres.  Additional foci are noted in the spinal cord adjacent to C2, right cerebral peduncle and right cerebellar hemisphere.  None of the foci appear to be acute and they do not enhance.  Compared to the MRI dated 01/20/2018, there are no new lesions.  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 ?REVIEW OF SYSTEMS: Out of a complete 14 system review of symptoms, the patient complains only of the following symptoms, lower extremity swelling , dysesthesias, anxiety, fatigue, urinary frequency, urge incontinence, and all other reviewed systems are negative. ? ? ?ALLERGIES: ?Allergies  ?Allergen Reactions  ?  Sulfamethoxazole-Trimethoprim Rash and Hives  ? Sulfa Antibiotics Other (See Comments) and Rash  ? ? ? ?HOME MEDICATIONS: ?Outpatient Medications Prior to Visit  ?Medication Sig Dispense Refill  ? acetaminophen (TYLENOL) 325 MG tablet Take 2 tablets (650 mg total) by mouth every 6 (six) hours as needed for mild pain (or Fever >/= 101).    ? busPIRone (BUSPAR) 7.5 MG tablet Take 7.5 mg by mouth 2 (two) times daily.    ? celecoxib (CELEBREX) 200 MG capsule SMARTSIG:2 Capsule(s) By Mouth Daily PRN    ? chlorthalidone (HYGROTON) 25 MG tablet Take 12.5 mg by mouth every morning.    ? cholecalciferol (VITAMIN D3) 25 MCG (1000 UT) tablet Take 1,000 Units by mouth daily.    ? cyclobenzaprine (FLEXERIL) 5 MG tablet Take 1 tablet (5 mg total) by mouth at bedtime as needed. (Patient taking differently: Take 5 mg by mouth at bedtime as needed for muscle spasms.) 30 tablet 11  ? docusate sodium (COLACE) 100 MG capsule Take 100 mg by mouth every morning.    ? gabapentin (NEURONTIN) 300 MG capsule One po qAM, one po qPM and two po qHS 120 capsule 11  ? Ibuprofen-Acetaminophen 125-250 MG TABS Take by mouth.    ? levonorgestrel (MIRENA) 20 MCG/24HR IUD 1 each by Intrauterine route once.    ? lidocaine (LIDODERM) 5 % Place 1 patch onto the skin daily. Remove & Discard patch within 12 hours or as directed by MD 90 patch 0  ? LORazepam (ATIVAN) 1 MG tablet Take 1 mg by mouth 2 (two) times daily as needed for anxiety.     ? meloxicam (MOBIC) 15 MG tablet Take 1 tablet (15 mg total) by mouth daily. (Patient taking differently: Take 15 mg by mouth daily as needed for pain.) 30 tablet 5  ? mometasone (ELOCON) 0.1 % cream Apply 1 application topically daily as needed (Rash). Up to 5 times per week 45 g 1  ? ondansetron (ZOFRAN-ODT) 4 MG disintegrating tablet Take 4 mg by mouth every 6 (six) hours as needed for nausea or vomiting.     ? oxybutynin (DITROPAN) 5 MG tablet TAKE 1 TABLET BY MOUTH  TWICE DAILY 180 tablet 3  ? Polyvinyl  Alcohol-Povidone (REFRESH OP) Place 2 drops into both eyes 4 (four) times daily as needed (dryness).    ? Probiotic Product (PROBIOTIC PO) Take 1 capsule by mouth daily.    ? Teriflunomide (AUBAGIO) 14 MG TABS Take 1 tablet by mouth daily. 30 tablet 11  ? TRELEGY ELLIPTA 200-62.5-25 MCG/ACT AEPB Inhale 1 puff into the lungs daily.    ?  WEGOVY 0.5 MG/0.5ML SOAJ Inject 0.5 mg into the skin once a week.    ? Phendimetrazine Tartrate 35 MG TABS Take 1 tablet by mouth 3 (three) times daily.    ? ?No facility-administered medications prior to visit.  ? ? ? ?PAST MEDICAL HISTORY: ?Past Medical History:  ?Diagnosis Date  ? Anemia 05/2019  ? iron infusions  ? Anxiety   ? Asthma   ? Cervical high risk human papillomavirus (HPV) DNA test positive 09/2015  ? Cholecystitis 2006  ? GALBLADDER REMOVED  ? Depression   ? Diverticulitis   ? Diverticulosis   ? Dysplastic nevus 08/24/2008  ? right upper back lat, right upper back medial  ? Fibroadenoma 2016  ? RIGHT BREAST  ? Head injury 06/25/2016  ? History of mammogram 11/2014  ? FIBROADENOMA  ? History of Papanicolaou smear of cervix 01/24/13; 10/15/15  ? -/-; -/+  ? Hypertension   ? Menorrhagia 02/01/2010  ? D/C HYSTEROSCOPY ENDO POLYP  ? MS (multiple sclerosis) ([place])   ? Ovarian cyst 9/15; 2015-2016  ? LEFT - WENT TO [place]; RIGHT  ? 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 ? ? ?PAST SURGICAL HISTORY: ?Past Surgical History:  ?Procedure Laterality Date  ? BIOPSY  03/21/2019  ? Procedure: BIOPSY;  Surgeon: [name], MD;  Location: [place];  Service: Gastroenterology;;  ? BREAST BIOPSY Right 2016  ? Fibroadenoma  ? CHOLECYSTECTOMY  2006  ? COLON RESECTION N/A 08/11/2019  ? Procedure: DIAGNOSTIC LAPAROSCOPY WITH [place] OUT, DRAIN PLACEMENT, AND LOOP ILEOSTOMY CREATION;  Surgeon: [name], MD;  [facility];  Service: General;  Laterality: N/A;  ? COLONOSCOPY WITH PROPOFOL N/A 03/21/2019  ? Procedure: COLONOSCOPY WITH PROPOFOL;  Surgeon: Mansouraty,  Telford Nab., MD;  Location: [place];  Service: Gastroenterology;  Laterality: N/A;  ultraslim colonoscope   ? CYSTOSCOPY WITH INJECTION N/A 08/04/2019  ? Procedure: FIREFLY INJECTION;  Surgeon: Ardis Hughs, MD;  [place]

## 2021-06-25 LAB — CBC WITH DIFFERENTIAL/PLATELET
Basophils Absolute: 0.1 10*3/uL (ref 0.0–0.2)
Basos: 1 %
EOS (ABSOLUTE): 0.2 10*3/uL (ref 0.0–0.4)
Eos: 2 %
Hematocrit: 38.6 % (ref 34.0–46.6)
Hemoglobin: 12.6 g/dL (ref 11.1–15.9)
Immature Grans (Abs): 0.1 10*3/uL (ref 0.0–0.1)
Immature Granulocytes: 1 %
Lymphocytes Absolute: 2 10*3/uL (ref 0.7–3.1)
Lymphs: 22 %
MCH: 27.3 pg (ref 26.6–33.0)
MCHC: 32.6 g/dL (ref 31.5–35.7)
MCV: 84 fL (ref 79–97)
Monocytes Absolute: 1.1 10*3/uL — ABNORMAL HIGH (ref 0.1–0.9)
Monocytes: 12 %
Neutrophils Absolute: 5.9 10*3/uL (ref 1.4–7.0)
Neutrophils: 62 %
Platelets: 352 10*3/uL (ref 150–450)
RBC: 4.62 x10E6/uL (ref 3.77–5.28)
RDW: 14.6 % (ref 11.7–15.4)
WBC: 9.4 10*3/uL (ref 3.4–10.8)

## 2021-06-25 LAB — HEPATIC FUNCTION PANEL
ALT: 18 IU/L (ref 0–32)
AST: 26 IU/L (ref 0–40)
Albumin: 4.2 g/dL (ref 3.8–4.8)
Alkaline Phosphatase: 130 IU/L — ABNORMAL HIGH (ref 44–121)
Bilirubin Total: 0.5 mg/dL (ref 0.0–1.2)
Bilirubin, Direct: 0.26 mg/dL (ref 0.00–0.40)
Total Protein: 6.8 g/dL (ref 6.0–8.5)

## 2021-06-26 ENCOUNTER — Ambulatory Visit
Admission: RE | Admit: 2021-06-26 | Discharge: 2021-06-26 | Disposition: A | Discharge status: Home / Self Care | Payer: BC Managed Care – PPO | Source: Ambulatory Visit | Attending: Obstetrics and Gynecology | Admitting: Obstetrics and Gynecology

## 2021-06-26 DIAGNOSIS — Z1231 Encounter for screening mammogram for malignant neoplasm of breast: Secondary | ICD-10-CM | POA: Diagnosis not present

## 2021-06-27 ENCOUNTER — Other Ambulatory Visit: Payer: Self-pay | Admitting: Obstetrics and Gynecology

## 2021-06-27 DIAGNOSIS — N63 Unspecified lump in unspecified breast: Secondary | ICD-10-CM

## 2021-06-27 DIAGNOSIS — R928 Other abnormal and inconclusive findings on diagnostic imaging of breast: Secondary | ICD-10-CM

## 2021-06-28 ENCOUNTER — Encounter: Payer: Self-pay | Admitting: Family Medicine

## 2021-07-01 ENCOUNTER — Encounter: Payer: Self-pay | Admitting: Family Medicine

## 2021-07-01 ENCOUNTER — Other Ambulatory Visit: Payer: Self-pay | Admitting: *Deleted

## 2021-07-02 ENCOUNTER — Telehealth: Payer: Self-pay | Admitting: *Deleted

## 2021-07-02 NOTE — Telephone Encounter (Signed)
Gave completed/signed application for disability license plate back to medical records to process for pt. ?

## 2021-07-09 ENCOUNTER — Encounter: Payer: Self-pay | Admitting: Obstetrics and Gynecology

## 2021-07-09 ENCOUNTER — Ambulatory Visit: Payer: BC Managed Care – PPO | Admitting: Obstetrics and Gynecology

## 2021-07-09 VITALS — BP 104/66 | Ht 66.0 in | Wt 248.0 lb

## 2021-07-09 DIAGNOSIS — Z30431 Encounter for routine checking of intrauterine contraceptive device: Secondary | ICD-10-CM | POA: Diagnosis not present

## 2021-07-09 DIAGNOSIS — N83201 Unspecified ovarian cyst, right side: Secondary | ICD-10-CM | POA: Diagnosis not present

## 2021-07-09 DIAGNOSIS — N83202 Unspecified ovarian cyst, left side: Secondary | ICD-10-CM

## 2021-07-09 NOTE — Progress Notes (Signed)
? ? ?[name], MD ? ? ?Chief Complaint  ?Patient presents with  ? Pelvic Pain  ?  On/off x couple of months  ? ? ?HPI: ?     Ms. SHIRLINE KENDLE is a 46 y.o. G0P0000 whose LMP was Patient's last menstrual period was 05/31/2021 (exact date)., presents today for eval of bilat ovar cysts, seen on CT, referred by PCP. Pt was having BLQ pain intermittently for past the 2 months, alternating RLQ and LLQ. Pain can be stabbing/fleeting or aching pain. Improved with NSAIDs. No aggrav factors. No urin sx, no GI sx except constipation. Takes miralax occas but nothing regularly for stools. Pt with hx of diverticulitis, large intestine perforation and abscess, so PCP ordered CT which showed "bilat cystic masses measuring 3.8 x 4.3 x 4.0 cm on RT. There is a second cystic area in the RT adnexal region measuring 2.1 x 3.0 cm. LT adnexal cystic area measures 3.8 x 3.0 x 3.1 cm." Pt with hx of ovar cysts in distant past.  ?She is sex active, no pain/bleeding. Has IUD, strings lost. Having monthly menses. Discussed replacement of Mirena with next menses due to monthly menses now. Last annual 05/15/21. ? ? ?Patient Active Problem List  ? Diagnosis Date Noted  ? 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 Urinary frequency 09/03/2020  ? Anxiety 09/03/2020  ? Right carpal tunnel syndrome 09/03/2020  ? Hypovolemia 09/17/2019  ? Weakness 09/16/2019  ? AKI (acute kidney injury) ([place]) 09/16/2019  ? Transaminitis 09/16/2019  ? Ileostomy in place [facility]) 08/20/2019  ? Postoperative anemia due to acute blood loss 08/20/2019  ? 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 Anemia of chronic disease 06/25/2019  ? Change in bowel habits 06/25/2019  ? IUD threads lost 01/26/2019  ? History of abnormal cervical Pap smear 01/25/2019  ? Diverticulitis of large intestine with perforation and abscess 12/30/2018  ? Hx of diverticulitis of colon 12/24/2018  ? Dyspepsia 12/24/2018  ? Non-intractable vomiting 12/24/2018  ? Abnormal  CT scan, pelvis 12/24/2018  ? Urinary dysfunction 12/30/2017  ? Varicose veins of bilateral lower extremities with pain 10/28/2017  ? Low grade squamous intraepithelial lesion (LGSIL) on cervical Pap smear 10/29/2016  ? Cervical high risk human papillomavirus (HPV) DNA test positive 10/15/2016  ? Essential hypertension 04/22/2016  ? Other headache syndrome 02/05/2016  ? Vitamin D deficiency 10/09/2015  ? Other fatigue 10/09/2015  ? Dysesthesia 04/11/2015  ? Depression with anxiety 04/11/2015  ? Gait disturbance 12/13/2014  ? Insomnia 12/13/2014  ? Multiple sclerosis ([place]) 08/30/2012  ? Relapsing remitting multiple sclerosis ([place]) 08/30/2012  ? ? ?Past Surgical History:  ?Procedure Laterality Date  ? BIOPSY  03/21/2019  ? Procedure: BIOPSY;  Surgeon: [name], MD;  Location: [place];  Service: Gastroenterology;;  ? BREAST BIOPSY Right 2016  ? Fibroadenoma  ? CHOLECYSTECTOMY  2006  ? COLON RESECTION N/A 08/11/2019  ? Procedure: DIAGNOSTIC LAPAROSCOPY WITH [place] OUT, DRAIN PLACEMENT, AND LOOP ILEOSTOMY CREATION;  Surgeon: [name], MD;  Location: [facility];  Service: General;  Laterality: N/A;  ? COLONOSCOPY WITH PROPOFOL N/A 03/21/2019  ? Procedure: COLONOSCOPY WITH PROPOFOL;  Surgeon: Mansouraty, Telford Nab., MD;  Location: [place];  Service: Gastroenterology;  Laterality: N/A;  ultraslim colonoscope   ? CYSTOSCOPY WITH INJECTION N/A 08/04/2019  ? Procedure: FIREFLY INJECTION;  Surgeon: Ardis Hughs, MD;  Location: [facility];  Service: Urology;  Laterality: N/A;  ? DILATION AND CURETTAGE, DIAGNOSTIC / THERAPEUTIC  02/01/2010  ?  D/C HYSTEROSCOPY ENDO POLYP; [facility]  ? ENDOSCOPIC MUCOSAL RESECTION  03/21/2019  ? Procedure: ENDOSCOPIC MUCOSAL RESECTION;  Surgeon: Rush Landmark Telford Nab., MD;  Location: [place];  Service: Gastroenterology;;  ? ESOPHAGOGASTRODUODENOSCOPY (EGD) WITH PROPOFOL N/A 03/21/2019  ? Procedure: ESOPHAGOGASTRODUODENOSCOPY (EGD) WITH PROPOFOL;  Surgeon: Rush Landmark  Telford Nab., MD;  Location: [place];  Service: Gastroenterology;  Laterality: N/A;  ? EYE SURGERY    ? Lasik  ? FLEXIBLE SIGMOIDOSCOPY  07/31/2016  ? Procedure: FLEXIBLE SIGMOIDOSCOPY;  Surgeon: [name], MD;  Location[facility];  Service: Endoscopy;;  ? HEMOSTASIS CLIP PLACEMENT  03/21/2019  ? Procedure: HEMOSTASIS CLIP PLACEMENT;  Surgeon: [name], MD;  Location: [place];  Service: Gastroenterology;;  ? HYSTEROSCOPY  02/01/2010  ? ENDO POLYP - [facility]  ? IR RADIOLOGIST EVAL & MGMT  11/16/2018  ? IR RADIOLOGIST EVAL & MGMT  09/06/2019  ? SCLEROTHERAPY  03/21/2019  ? Procedure: SCLEROTHERAPY;  Surgeon: Mansouraty, Telford Nab., MD;  Location: [place];  Service: Gastroenterology;;  ? ? ?Family History  ?Problem Relation Age of Onset  ? Breast cancer Maternal Aunt 57  ?     has contact  ? Rheum arthritis Mother   ? Hypertension Mother   ? Thyroid disease Mother   ?     HYPOTHYROIDISM  ? Lung cancer Mother 53  ? Heart disease Father   ? Diabetes Father   ? Hypertension Father   ? Cancer Paternal Grandmother   ?     MELANOMA OF SKIN  ? Cancer Cousin   ?     KIDNEY-COUSIN  ? Colon cancer Neg Hx   ? Stomach cancer Neg Hx   ? Pancreatic cancer Neg Hx   ? Esophageal cancer Neg Hx   ? Inflammatory bowel disease Neg Hx   ? Rectal cancer Neg Hx   ? ? ?Social History  ? ?Socioeconomic History  ? Marital status: Divorced  ?  Spouse name: Not on file  ? Number of children: 0  ? Years of education: 65  ? Highest education level: Not on file  ?Occupational History  ?  Employer: [employer]  ?Tobacco Use  ? Smoking status: Former  ?  Years: 10.00  ?  Types: Cigarettes  ?  Quit date: 2010  ?  Years since quitting: 13.2  ? Smokeless tobacco: Never  ?Vaping Use  ? Vaping Use: Never used  ?Substance and Sexual Activity  ? Alcohol use: No  ? Drug use: No  ? Sexual activity: Yes  ?  Partners: Male  ?  Birth control/protection: I.U.D.  ?  Comment: Mirena  ?Other Topics Concern  ? Not on file  ?Social History  Narrative  ? Not on file  ? ?Social Determinants of Health  ? ?Financial Resource Strain: Not on file  ?Food Insecurity: Not on file  ?Transportation Needs: Not on file  ?Physical Activity: Not on file  ?Stress: Not on file  ?Social Connections: Not on file  ?Intimate Partner Violence: Not on file  ? ? ?Outpatient Medications Prior to Visit  ?Medication Sig Dispense Refill  ? acetaminophen (TYLENOL) 325 MG tablet Take 2 tablets (650 mg total) by mouth every 6 (six) hours as needed for mild pain (or Fever >/= 101).    ? busPIRone (BUSPAR) 7.5 MG tablet Take 7.5 mg by mouth 2 (two) times daily.    ? celecoxib (CELEBREX) 200 MG capsule SMARTSIG:2 Capsule(s) By Mouth Daily PRN    ? chlorthalidone (HYGROTON) 25 MG tablet Take 12.5 mg  by mouth every morning.    ? cholecalciferol (VITAMIN D3) 25 MCG (1000 UT) tablet Take 1,000 Units by mouth daily.    ? cyclobenzaprine (FLEXERIL) 5 MG tablet Take 1 tablet (5 mg total) by mouth at bedtime as needed. (Patient taking differently: Take 5 mg by mouth at bedtime as needed for muscle spasms.) 30 tablet 11  ? docusate sodium (COLACE) 100 MG capsule Take 100 mg by mouth every morning.    ? gabapentin (NEURONTIN) 300 MG capsule One po qAM, one po qPM and two po qHS 120 capsule 11  ? Ibuprofen-Acetaminophen 125-250 MG TABS Take by mouth.    ? levonorgestrel (MIRENA) 20 MCG/24HR IUD 1 each by Intrauterine route once.    ? lidocaine (LIDODERM) 5 % Place 1 patch onto the skin daily. Remove & Discard patch within 12 hours or as directed by MD 90 patch 0  ? LORazepam (ATIVAN) 1 MG tablet Take 1 mg by mouth 2 (two) times daily as needed for anxiety.     ? meloxicam (MOBIC) 15 MG tablet Take 1 tablet (15 mg total) by mouth daily. (Patient taking differently: Take 15 mg by mouth daily as needed for pain.) 30 tablet 5  ? mirabegron ER (MYRBETRIQ) 25 MG TB24 tablet Take 1 tablet (25 mg total) by mouth daily. 90 tablet 1  ? mometasone (ELOCON) 0.1 % cream Apply 1 application topically daily as  needed (Rash). Up to 5 times per week 45 g 1  ? ondansetron (ZOFRAN-ODT) 4 MG disintegrating tablet Take 4 mg by mouth every 6 (six) hours as needed for nausea or vomiting.     ? Polyvinyl Alcohol-Povidone (RE

## 2021-07-17 ENCOUNTER — Ambulatory Visit
Admission: RE | Admit: 2021-07-17 | Discharge: 2021-07-17 | Disposition: A | Discharge status: Home / Self Care | Payer: BC Managed Care – PPO | Source: Ambulatory Visit | Attending: Obstetrics and Gynecology | Admitting: Obstetrics and Gynecology

## 2021-07-17 DIAGNOSIS — R928 Other abnormal and inconclusive findings on diagnostic imaging of breast: Secondary | ICD-10-CM | POA: Insufficient documentation

## 2021-07-17 DIAGNOSIS — N63 Unspecified lump in unspecified breast: Secondary | ICD-10-CM | POA: Insufficient documentation

## 2021-07-18 ENCOUNTER — Encounter: Payer: Self-pay | Admitting: Obstetrics and Gynecology

## 2021-08-01 ENCOUNTER — Ambulatory Visit (INDEPENDENT_AMBULATORY_CARE_PROVIDER_SITE_OTHER): Payer: BC Managed Care – PPO

## 2021-08-01 DIAGNOSIS — Z30431 Encounter for routine checking of intrauterine contraceptive device: Secondary | ICD-10-CM

## 2021-08-01 DIAGNOSIS — N83202 Unspecified ovarian cyst, left side: Secondary | ICD-10-CM

## 2021-08-01 DIAGNOSIS — N83201 Unspecified ovarian cyst, right side: Secondary | ICD-10-CM

## 2021-08-06 ENCOUNTER — Telehealth: Payer: Self-pay | Admitting: Obstetrics and Gynecology

## 2021-08-06 DIAGNOSIS — N83201 Unspecified ovarian cyst, right side: Secondary | ICD-10-CM

## 2021-08-06 NOTE — Telephone Encounter (Signed)
LM with GYN u/s results. Seen 4/23 for ovar cyst f/u. Had bilat cystic masses measuring 3.8 x 4.3 x 4.0 cm on RT. There is a second cystic area in the RT adnexal region measuring 2.1 x 3.0 cm. LT adnexal cystic area measures 3.8 x 3.0 x 3.1 cm." On CT from 4/23 (scanned in Epic). Pt with hx of ovar cysts in distant past. Was having BLQ pains so CT ordered.  ?Gyn u/s shows RTO cyst without changes, LTO cyst smaller, may be dermoid. Recommended repeat u/s in 12 wks. Office to call to schedule appt.  ?

## 2021-08-12 NOTE — Progress Notes (Signed)
Pt supposed to have 12 wk f/u GYN u/s (order placed last wk). Did she ever get this scheduled? Thx.

## 2021-09-26 ENCOUNTER — Ambulatory Visit
Admission: RE | Admit: 2021-09-26 | Discharge: 2021-09-26 | Disposition: A | Discharge status: Home / Self Care | Payer: BC Managed Care – PPO | Source: Ambulatory Visit | Attending: Obstetrics and Gynecology | Admitting: Obstetrics and Gynecology

## 2021-09-26 DIAGNOSIS — N83201 Unspecified ovarian cyst, right side: Secondary | ICD-10-CM | POA: Insufficient documentation

## 2021-09-26 DIAGNOSIS — N83202 Unspecified ovarian cyst, left side: Secondary | ICD-10-CM | POA: Insufficient documentation

## 2021-09-30 ENCOUNTER — Telehealth: Payer: Self-pay | Admitting: Obstetrics and Gynecology

## 2021-09-30 NOTE — Telephone Encounter (Signed)
Pt aware bilat ovar cysts resolved; no longer having pelvic pain. IUD not in uterus per u/s last wk but was there per u/s from 5/23. Pt hasn't had a period in 2 months but has been sexually active, so recommended UPT. Was starting to get them monthly again earlier this yr. Will have MD review u/s images from 5/23 to confirm IUD presence and will f/u with pt. Pt would like another IUD.

## 2021-10-25 ENCOUNTER — Ambulatory Visit: Payer: BC Managed Care – PPO | Admitting: Obstetrics and Gynecology

## 2021-10-25 ENCOUNTER — Encounter: Payer: Self-pay | Admitting: Obstetrics and Gynecology

## 2021-10-25 VITALS — BP 120/70 | Ht 65.0 in | Wt 255.0 lb

## 2021-10-25 DIAGNOSIS — Z538 Procedure and treatment not carried out for other reasons: Secondary | ICD-10-CM

## 2021-10-25 DIAGNOSIS — Z30430 Encounter for insertion of intrauterine contraceptive device: Secondary | ICD-10-CM | POA: Diagnosis not present

## 2021-10-25 MED ORDER — LEVONORGESTREL 20 MCG/DAY IU IUD
1.0000 | INTRAUTERINE_SYSTEM | Freq: Once | INTRAUTERINE | Status: AC
  Administered 2021-10-25: 1 via INTRAUTERINE

## 2021-10-25 NOTE — Progress Notes (Addendum)
   Chief Complaint  Patient presents with   Contraception    Mirena insertion     IUD PROCEDURE NOTE:  Sandra Bonilla is a 46 y.o. G0P0000 here for Mirena  IUD insertion for [facility] and heavier periods. Mirena replaced 11/15/15; had random light bleeding with IUD. Strings lost but presence confirmed on CT 7/20 and 5/23 GYN u/s. Pt was having pelvic pain and had ovar cysts on 5/23 u/s. Pt was also having more monthly periods at that time. On f/u u/s 6/23 for bilat ovar cysts, IUD was no longer in uterus. Pt had more normal period 7/23.  Last annual 05/15/21.  BP 120/70   Ht '5\' 5"'$  (1.651 m)   Wt 255 lb (115.7 kg)   LMP 10/13/2021 (Exact Date)   BMI 42.43 kg/m   IUD Insertion Procedure Note Patient identified, informed consent performed, consent signed.   Discussed risks of irregular bleeding, cramping, infection, malpositioning or misplacement of the IUD outside the uterus which may require further procedure such as laparoscopy, risk of failure <1%. Time out was performed.    Speculum placed in the vagina.  Cervix visualized.  Cleaned with Betadine x 2.  Grasped anteriorly with a single tooth tenaculum.  Uterus sounded to 7.0 cm. First IUD attempted but not in correct location. 2nd IUD attempt placed per manufacturer's recommendations.  Strings trimmed to 3 cm. Tenaculum was removed, good hemostasis noted.  Patient tolerated procedure well.   ASSESSMENT:  Encounter for insertion of intrauterine contraceptive device (IUD) - Plan: levonorgestrel (MIRENA) 20 MCG/DAY IUD 1 each    Unsuccessful attempt to insert intrauterine device (IUD)--1 IUD attempt was unsuccessful. 2nd IUD attempt was successful and Mirena placed.   Meds ordered this encounter  Medications   levonorgestrel (MIRENA) 20 MCG/DAY IUD 1 each     Plan:  Patient was given post-procedure instructions.  She was advised to have backup contraception for one week.   Call if you are having increasing pain, cramps or bleeding  or if you have a fever greater than 100.4 degrees F., shaking chills, nausea or vomiting. Patient was also asked to check IUD strings periodically and follow up in 4 weeks for IUD check.  Return in about 4 weeks (around 11/22/2021) for IUD f/u. (Will try to do quick GYN u/s at that time to make sure 2 IUDs not there now)  [place]ovell Roe B. Luigi Stuckey, PA-C 10/25/2021 4:18 PM

## 2021-10-25 NOTE — Patient Instructions (Signed)
I value your feedback and you entrusting us with your care. If you get a [place] patient survey, I would appreciate you taking the time to let us know about your experience today. Thank you!  [facility] [phone]  Instructions after IUD insertion  Most women experience no significant problems after insertion of an IUD, however minor cramping and spotting for a few days is common. Cramps may be treated with ibuprofen 800mg every 8 hours or Tylenol 650 mg every 4 hours. Contact [facility] immediately if you experience any of the following symptoms during the next week: temperature >99.6 degrees, worsening pelvic pain, abdominal pain, fainting, unusually heavy vaginal bleeding, foul vaginal discharge, or if you think you have expelled the IUD.  Nothing inserted in the vagina for 48 hours. You will be scheduled for a follow up visit in approximately four weeks.  You should check monthly to be sure you can feel the IUD strings in the upper vagina. If you are having a monthly period, try to check after each period. If you cannot feel the IUD strings,  contact [facility] immediately so we can do an exam to determine if the IUD has been expelled.   Please use backup protection until we can confirm the IUD is in place.  Call [facility] if you are exposed to or diagnosed with a sexually transmitted infection, as we will need to discuss whether it is safe for you to continue using an IUD.   

## 2021-11-04 ENCOUNTER — Encounter: Payer: Self-pay | Admitting: Obstetrics and Gynecology

## 2021-11-20 NOTE — Progress Notes (Unsigned)
   No chief complaint on file.    History of Present Illness:  Sandra Bonilla is a 46 y.o. that had a Mirena IUD placed approximately 1 month ago. Since that time, she denies dyspareunia, pelvic pain, non-menstrual bleeding, vaginal d/c, heavy bleeding.   Review of Systems  Physical Exam:  LMP 10/13/2021 (Exact Date)  There is no height or weight on file to calculate BMI.  Pelvic exam:  Two IUD strings {DESC; PRESENT/ABSENT:17923} seen coming from the cervical os. EGBUS, vaginal vault and cervix: within normal limits   Assessment:   No diagnosis found.  IUD strings present in proper location; pt doing well  Plan: F/u if any signs of infection or can no longer feel the strings.   [place] Hara B. Delwyn Scoggin, PA-C 11/20/2021 4:10 PM

## 2021-11-21 ENCOUNTER — Encounter: Payer: Self-pay | Admitting: Obstetrics and Gynecology

## 2021-11-21 ENCOUNTER — Ambulatory Visit: Payer: BC Managed Care – PPO | Admitting: Obstetrics and Gynecology

## 2021-11-21 VITALS — BP 114/70 | Wt 256.8 lb

## 2021-11-21 DIAGNOSIS — Z30431 Encounter for routine checking of intrauterine contraceptive device: Secondary | ICD-10-CM | POA: Diagnosis not present

## 2021-12-26 ENCOUNTER — Other Ambulatory Visit: Payer: Self-pay

## 2021-12-26 MED ORDER — MIRABEGRON ER 25 MG PO TB24: 25.0000 mg | ORAL_TABLET | Freq: Every day | ORAL | 1 refills | Status: DC

## 2021-12-31 ENCOUNTER — Encounter: Payer: Self-pay | Admitting: Gastroenterology

## 2021-12-31 ENCOUNTER — Ambulatory Visit: Payer: BC Managed Care – PPO | Admitting: Gastroenterology

## 2021-12-31 VITALS — BP 136/96 | HR 80 | Ht 65.0 in | Wt 262.0 lb

## 2021-12-31 DIAGNOSIS — D128 Benign neoplasm of rectum: Secondary | ICD-10-CM

## 2021-12-31 DIAGNOSIS — R140 Abdominal distension (gaseous): Secondary | ICD-10-CM | POA: Diagnosis not present

## 2021-12-31 DIAGNOSIS — K625 Hemorrhage of anus and rectum: Secondary | ICD-10-CM | POA: Diagnosis not present

## 2021-12-31 DIAGNOSIS — R12 Heartburn: Secondary | ICD-10-CM | POA: Diagnosis not present

## 2021-12-31 DIAGNOSIS — R194 Change in bowel habit: Secondary | ICD-10-CM

## 2021-12-31 DIAGNOSIS — K5720 Diverticulitis of large intestine with perforation and abscess without bleeding: Secondary | ICD-10-CM

## 2021-12-31 DIAGNOSIS — Z8719 Personal history of other diseases of the digestive system: Secondary | ICD-10-CM

## 2021-12-31 DIAGNOSIS — Z86010 Personal history of colonic polyps: Secondary | ICD-10-CM

## 2021-12-31 DIAGNOSIS — Z9049 Acquired absence of other specified parts of digestive tract: Secondary | ICD-10-CM

## 2021-12-31 MED ORDER — NA SULFATE-K SULFATE-MG SULF 17.5-3.13-1.6 GM/177ML PO SOLN: 1.0000 | ORAL | 0 refills | Status: DC

## 2021-12-31 NOTE — Patient Instructions (Addendum)
GERD Lifestyle modifications -Eat meals >3 hours before bedtime -Do not lay down or lie flat immediately after eating for at least 2-hours -Do not overeat (decrease portion size and/or eat more frequent smaller meals) -Eat slowly -Wear Loose-fitting clothes -Raise Head of Bed (Head & Chest > Feet with bed blocks) -Avoid foods that trigger the symptoms (Onions, Chocolate, Caffeine, Spicy, and Fatty) -Work on Losing Weight -Increase Exercise Activity -Maintain Heartburn Diary/Log  We have sent the following medications to your pharmacy for you to pick up at your convenience: [place] have been scheduled for an endoscopy and colonoscopy. Please follow the written instructions given to you at your visit today. Please pick up your prep supplies at the pharmacy within the next 1-3 days. If you use inhalers (even only as needed), please bring them with you on the day of your procedure.  _______________________________________________________  If you are age 66 or older, your body mass index should be between 23-30. Your Body mass index is 43.6 kg/m. If this is out of the aforementioned range listed, please consider follow up with your Primary Care Provider.  If you are age 56 or younger, your body mass index should be between 19-25. Your Body mass index is 43.6 kg/m. If this is out of the aformentioned range listed, please consider follow up with your Primary Care Provider.   ________________________________________________________  The [facility] providers would like to encourage you to use [facility] to communicate with providers for non-urgent requests or questions.  Due to long hold times on the telephone, sending your provider a message by [facility] may be a faster and more efficient way to get a response.  Please allow 48 business hours for a response.  Please remember that this is for non-urgent requests.  _______________________________________________________  Thank you for choosing  me and [facility].  Dr. Rush Landmark

## 2022-01-02 ENCOUNTER — Encounter: Payer: Self-pay | Admitting: Gastroenterology

## 2022-01-02 DIAGNOSIS — Z9049 Acquired absence of other specified parts of digestive tract: Secondary | ICD-10-CM | POA: Insufficient documentation

## 2022-01-02 DIAGNOSIS — R194 Change in bowel habit: Secondary | ICD-10-CM | POA: Insufficient documentation

## 2022-01-02 DIAGNOSIS — R140 Abdominal distension (gaseous): Secondary | ICD-10-CM | POA: Insufficient documentation

## 2022-01-02 DIAGNOSIS — R12 Heartburn: Secondary | ICD-10-CM | POA: Insufficient documentation

## 2022-01-02 DIAGNOSIS — Z86010 Personal history of colonic polyps: Secondary | ICD-10-CM | POA: Insufficient documentation

## 2022-01-02 DIAGNOSIS — K625 Hemorrhage of anus and rectum: Secondary | ICD-10-CM | POA: Insufficient documentation

## 2022-01-02 NOTE — Progress Notes (Signed)
[place] VISIT   Primary Care Provider [name], MD [place] [address] [phone]  Patient Profile: Sandra Bonilla is a 46 y.o. female with a pmh significant for complicated diverticulitis status post resection with colostomy and subsequent reversal, anxiety, depression, status post cholecystectomy, hypertension, obesity, multiple sclerosis (on no medications), rectal TVA status post resection.  The patient presents to the [facility] for an evaluation and management of problem(s) noted below:  Problem List 1. BRBPR (bright red blood per rectum)   2. Change in bowel habit   3. Pyrosis   4. Abdominal bloating   5. History of diverticulitis   6. History of colon resection   7. Tubulovillous adenoma of rectum   8. Hx of adenomatous colonic polyps     History of Present Illness Please see prior notes for full details of HPI.  Interval History I had not seen the patient since early 2021.  She ended up undergoing a segmental resection in [place] with colostomy.  Due to issues surrounding this particular ostomy and the discomfort she was experiencing with this, she ended up going for a second opinion ended up going to [facility] where she saw colorectal surgery there.  She had a repeat flexible sigmoidoscopy performed before she had her colostomy reversal.  Subsequently she had a colostomy reversal in 2021.  Over the course of the last 6 months she has been experiencing slight change in her bowel habits as well as increasing bloating and gas at times.  She noted bright red blood per rectum in the stool or toilet approximately 2 times per week over the last 4 to 6 months.  In June she was initiated by her PCP for "diarrheal symptoms WelChol which she uses 3 times a day and she has noted some very partial effectiveness to this.  She is having some straining that occurs.  At times she gets some heartburn symptoms as well.   She has gained 15 to 20 pounds over the course of the last few years.  She will use as needed medications for her acid reflux which helps somewhat.  She denies any dysphagia or odynophagia.  GI Review of Systems Positive as above Negative for early satiety, melena  Review of Systems General: Denies current fevers or chills or unintentional weight loss Cardiovascular: Denies chest pain Pulmonary: No significant shortness of breath Gastroenterological: See HPI Genitourinary: Denies darkened urine Hematological: Denies easy bruising/bleeding Dermatological: Denies jaundice Psychological: Mood is stable   Medications Current Outpatient Medications  Medication Sig Dispense Refill   acetaminophen (TYLENOL) 325 MG tablet Take 2 tablets (650 mg total) by mouth every 6 (six) hours as needed for mild pain (or Fever >/= 101).     busPIRone (BUSPAR) 15 MG tablet Take 15 mg by mouth 2 (two) times daily.     celecoxib (CELEBREX) 200 MG capsule SMARTSIG:2 Capsule(s) By Mouth Daily PRN     chlorthalidone (HYGROTON) 25 MG tablet Take 12.5 mg by mouth every morning.     cholecalciferol (VITAMIN D3) 25 MCG (1000 UT) tablet Take 1,000 Units by mouth daily.     colesevelam (WELCHOL) 625 MG tablet Take 1,250 mg by mouth 3 (three) times daily.     cyclobenzaprine (FLEXERIL) 5 MG tablet Take 1 tablet (5 mg total) by mouth at bedtime as needed. (Patient taking differently: Take 5 mg by mouth at bedtime as needed for muscle spasms.) 30 tablet 11   gabapentin (NEURONTIN) 300 MG capsule One  po qAM, one po qPM and two po qHS 120 capsule 11   Ibuprofen-Acetaminophen 125-250 MG TABS Take by mouth.     levonorgestrel (MIRENA) 20 MCG/DAY IUD 1 each by Intrauterine route once.     lidocaine (LIDODERM) 5 % Place 1 patch onto the skin daily. Remove & Discard patch within 12 hours or as directed by MD 90 patch 0   LORazepam (ATIVAN) 1 MG tablet Take 1 mg by mouth 2 (two) times daily as needed for anxiety.      meloxicam  (MOBIC) 15 MG tablet Take 1 tablet (15 mg total) by mouth daily. (Patient taking differently: Take 15 mg by mouth daily as needed for pain.) 30 tablet 5   mirabegron ER (MYRBETRIQ) 25 MG TB24 tablet Take 1 tablet (25 mg total) by mouth daily. 90 tablet 1   mometasone (ELOCON) 0.1 % cream Apply 1 application topically daily as needed (Rash). Up to 5 times per week 45 g 1   Na Sulfate-K Sulfate-Mg Sulf (SUPREP BOWEL PREP KIT) 17.5-3.13-1.6 GM/177ML SOLN Take 1 kit by mouth as directed. For colonoscopy prep 354 mL 0   ondansetron (ZOFRAN-ODT) 4 MG disintegrating tablet Take 4 mg by mouth every 6 (six) hours as needed for nausea or vomiting.      Polyvinyl Alcohol-Povidone (REFRESH OP) Place 2 drops into both eyes 4 (four) times daily as needed (dryness).     Probiotic Product (PROBIOTIC PO) Take 1 capsule by mouth daily.     Teriflunomide 14 MG TABS SMARTSIG:1 Tablet(s) By Mouth Daily     TRELEGY ELLIPTA 200-62.5-25 MCG/ACT AEPB Inhale 1 puff into the lungs daily.     No current facility-administered medications for this visit.    Allergies Allergies  Allergen Reactions   Sulfamethoxazole-Trimethoprim Rash and Hives   Sulfa Antibiotics Other (See Comments) and Rash    Histories Past Medical History:  Diagnosis Date   Anemia 05/2019   iron infusions   Anxiety    Asthma    Cervical high risk human papillomavirus (HPV) DNA test positive 09/2015   Cholecystitis 2006   GALBLADDER REMOVED   Depression    Diverticulitis    Diverticulosis    Dysplastic nevus 08/24/2008   right upper back lat, right upper back medial   Fibroadenoma 2016   RIGHT BREAST   Head injury 06/25/2016   History of mammogram 11/2014   FIBROADENOMA   History of Papanicolaou smear of cervix 01/24/13; 10/15/15   -/-; -/+   Hypertension    Menorrhagia 02/01/2010   D/C HYSTEROSCOPY ENDO POLYP   MS (multiple sclerosis) ([place])    Ovarian cyst 9/15; 2015-2016   LEFT - WENT TO [place]; RIGHT   Pericolonic abscess due to  diverticulitis 2020   Past Surgical History:  Procedure Laterality Date   BIOPSY  03/21/2019   Procedure: BIOPSY;  Surgeon: [name], MD;  Location: [place];  Service: Gastroenterology;;   BREAST BIOPSY Right 2016   Fibroadenoma   CHOLECYSTECTOMY  2006   COLON RESECTION N/A 08/11/2019   Procedure: DIAGNOSTIC LAPAROSCOPY WITH [place] OUT, DRAIN PLACEMENT, AND LOOP ILEOSTOMY CREATION;  Surgeon: [name], MD;  Location: [facility];  Service: General;  Laterality: N/A;   COLONOSCOPY WITH PROPOFOL N/A 03/21/2019   Procedure: COLONOSCOPY WITH PROPOFOL;  Surgeon: [name], MD;  Location: [facility];  Service: Gastroenterology;  Laterality: N/A;  ultraslim colonoscope    CYSTOSCOPY WITH INJECTION N/A 08/04/2019   Procedure: FIREFLY INJECTION;  Surgeon: Ardis Hughs, MD;  Location: [facility];  Service: Urology;  Laterality: N/A;   DILATION AND CURETTAGE, DIAGNOSTIC / THERAPEUTIC  02/01/2010   D/C HYSTEROSCOPY ENDO POLYP; [facility]   ENDOSCOPIC MUCOSAL RESECTION  03/21/2019   Procedure: ENDOSCOPIC MUCOSAL RESECTION;  Surgeon: Rush Landmark Telford Nab., MD;  Location: [place];  Service: Gastroenterology;;   ESOPHAGOGASTRODUODENOSCOPY (EGD) WITH PROPOFOL N/A 03/21/2019   Procedure: ESOPHAGOGASTRODUODENOSCOPY (EGD) WITH PROPOFOL;  Surgeon: [name], MD;  Location: [place];  Service: Gastroenterology;  Laterality: N/A;   EYE SURGERY     Lasik   FLEXIBLE SIGMOIDOSCOPY  07/31/2016   Procedure: FLEXIBLE SIGMOIDOSCOPY;  Surgeon: [name], MD;  Location: [facility];  Service: Endoscopy;;   HEMOSTASIS CLIP PLACEMENT  03/21/2019   Procedure: HEMOSTASIS CLIP PLACEMENT;  Surgeon: [name], MD;  Location: [place];  Service: Gastroenterology;;   HYSTEROSCOPY  02/01/2010   ENDO POLYP - [facility]   IR RADIOLOGIST EVAL & MGMT  11/16/2018   IR RADIOLOGIST EVAL & MGMT  09/06/2019   SCLEROTHERAPY  03/21/2019   Procedure: Clide Deutscher;  Surgeon:  Mansouraty, Telford Nab., MD;  Location: [facility];  Service: Gastroenterology;;   Social History   Socioeconomic History   Marital status: Divorced    Spouse name: Not on file   Number of children: 0   Years of education: 14   Highest education level: Not on file  Occupational History    Employer: [employer]  Tobacco Use   Smoking status: Former    Years: 10.00    Types: Cigarettes    Quit date: 2010    Years since quitting: 13.7   Smokeless tobacco: Never  Vaping Use   Vaping Use: Never used  Substance and Sexual Activity   Alcohol use: No   Drug use: No   Sexual activity: Yes    Partners: Male    Birth control/protection: I.U.D.    Comment: Mirena  Other Topics Concern   Not on file  Social History Narrative   Not on file   Social Determinants of Health   Financial Resource Strain: Not on file  Food Insecurity: Not on file  Transportation Needs: Not on file  Physical Activity: Not on file  Stress: Not on file  Social Connections: Not on file  Intimate Partner Violence: Not on file   Family History  Problem Relation Age of Onset   Breast cancer Maternal Aunt 41       has contact   Rheum arthritis Mother    Hypertension Mother    Thyroid disease Mother        HYPOTHYROIDISM   Lung cancer Mother 7   Heart disease Father    Diabetes Father    Hypertension Father    Cancer Paternal Grandmother        MELANOMA OF SKIN   Cancer Cousin        KIDNEY-COUSIN   Colon cancer Neg Hx    Stomach cancer Neg Hx    Pancreatic cancer Neg Hx    Esophageal cancer Neg Hx    Inflammatory bowel disease Neg Hx    Rectal cancer Neg Hx    I have reviewed her medical, social, and family history in detail and updated the electronic medical record as necessary.    PHYSICAL EXAMINATION  BP (!) 136/96   Pulse 80   Ht 5' 5"  (1.651 m)   Wt 262 lb (118.8 kg)   BMI 43.60 kg/m  Wt Readings from Last 3 Encounters:  12/31/21 262 lb (118.8 kg)  11/21/21  256 lb 12.8 oz (116.5  kg)  10/25/21 255 lb (115.7 kg)  GEN: NAD, appears stated age, doesn't appear chronically ill PSYCH: Cooperative, without pressured speech EYE: Conjunctivae pink, sclerae anicteric ENT: MMM CV: Nontachycardic RESP: No audible wheezing GI: NABS, soft, nontender, rounded, surgical scars present, no rebound or guarding MSK/EXT: No lower extremity edema SKIN: No jaundice NEURO:  Alert & Oriented x 3, no focal deficits   REVIEW OF DATA  I reviewed the following data at the time of this encounter:  GI Procedures and Studies  Today we reviewed the previous endoscopic evaluation December 2020 EGD - No gross lesions in esophagus. Z-line regular, 37 cm from the incisors. - Erosive nodular gastropathy with no bleeding and no stigmata of recent bleeding. Biopsied for HP. - No gross lesions in the duodenal bulb, in the first portion of the duodenum and in the second portion of the duodenum. Biopsied for Celiac.  December 2020 colonoscopy - Palpable rectal lesion found on digital rectal exam. - The examined portion of the ileum was normal. - Diverticulosis in the recto-sigmoid colon, in the sigmoid colon and in the descending colon. - One 18 mm polyp in the distal rectum, removed with mucosal resection. Resected and retrieved. Treated with a hot snare tip to the edge of resection. Clips (MR conditional) were placed. - Normal mucosa in the entire examined colon otherwise. - Non-bleeding non-thrombosed internal hemorrhoids.  Pathology FINAL MICROSCOPIC DIAGNOSIS:  A. DUODENUM, BIOPSY:  - Duodenal mucosa with no specific histopathologic changes  - Negative for increased intraepithelial lymphocytes or villous  architectural changes  B. STOMACH, BIOPSY:  - Gastric antral and oxyntic mucosa with no specific histopathologic  changes  - Warthin Starry stain is negative for Helicobacter pylori  C. RECTUM, EMR:  - Tubulovillous adenoma with focal high-grade dysplasia  - Negative for carcinoma  -  Lateral mucosal margin is focally positive for low-grade dysplasia  Laboratory Studies  Reviewed those in epic  Imaging Studies  May 2022 CT abdomen pelvis done at [facility]:  1. No acute abnormality identified within the abdomen or pelvis.  Specifically, no evidence of bowel obstruction, abscess, or acute  appendicitis.  2. Interval surgical reversal of the diverting ileostomy that was present  on previous exam in September 2021.  3. Presumed physiologic cysts within each ovary.      ASSESSMENT  Ms. Whitaker is a 46 y.o. female with a pmh significant for complicated diverticulitis status post resection with colostomy and subsequent reversal, anxiety, depression, status post cholecystectomy, hypertension, obesity, multiple sclerosis (on no medications), rectal TVA status post resection.  The patient is seen today for evaluation and management of:  1. BRBPR (bright red blood per rectum)   2. Change in bowel habit   3. Pyrosis   4. Abdominal bloating   5. History of diverticulitis   6. History of colon resection   7. Tubulovillous adenoma of rectum   8. Hx of adenomatous colonic polyps    The patient is hemodynamically stable at this time.  Clinically, she is experiencing symptoms of abdominal bloating and gas.  Biggest thing for me is whether her changes in bowel habits as well could be a result of any sort of anastomotic stricturing.  With the bleeding that she has been noting as well certainly need to evaluate the colon to ensure nothing else is being missed and she is due for her surveillance colonoscopy in the setting of the rectal tubulovillous adenoma that was resected.  Thankfully earlier  this year and her blood counts were normal without evidence of anemia so I am most suspicious that this is more oily in nature in regards to the bleeding but certainly to be vigilant.  In regards to her persisting pyrosis symptoms I suspect this is probably exacerbated by some weight gain that  she has had.  We went over lifestyle modifications as discussed symptomatic management for her GERD and potential progressive PPI use in the near future depending on how she does.  We will repeat her upper endoscopy just to ensure nothing else is developed in the setting of her new abdominal bloating and gas symptoms as well.  The risks and benefits of endoscopic evaluation were discussed with the patient; these include but are not limited to the risk of perforation, infection, bleeding, missed lesions, lack of diagnosis, severe illness requiring hospitalization, as well as anesthesia and sedation related illnesses.  The patient and/or family is agreeable to proceed.  All patient questions were answered to the best of my ability, and the patient agrees to the aforementioned plan of action with follow-up as indicated.   PLAN  Proceed with scheduling surveillance colonoscopy Proceed with scheduling diagnostic endoscopy PPI therapy may be restarted GERD lifestyle modifications discussed with patient Pending work-up will decide what additional therapies may be required for her rectal bleeding She may continue WelChol for now as per PCP but will decide if it is going to be indicated in the near future after scopic evaluation   Orders Placed This Encounter  Procedures   Ambulatory referral to Gastroenterology    New Prescriptions   NA SULFATE-K SULFATE-MG SULF (SUPREP BOWEL PREP KIT) 17.5-3.13-1.6 GM/177ML SOLN    Take 1 kit by mouth as directed. For colonoscopy prep   Modified Medications   No medications on file    Planned Follow Up No follow-ups on file.   Total Time in Face-to-Face and in Coordination of Care for patient including independent/personal interpretation/review of prior testing, medical history, examination, medication adjustment, communicating results with the patient directly, and documentation with the EHR is 25 minutes.   Justice Britain, MD [place]  Gastroenterology Advanced Endoscopy Office # [phone]

## 2022-01-13 ENCOUNTER — Ambulatory Visit
Admission: RE | Admit: 2022-01-13 | Discharge: 2022-01-13 | Disposition: A | Discharge status: Home / Self Care | Payer: BC Managed Care – PPO | Source: Ambulatory Visit | Attending: Obstetrics and Gynecology | Admitting: Obstetrics and Gynecology

## 2022-01-13 DIAGNOSIS — N63 Unspecified lump in unspecified breast: Secondary | ICD-10-CM | POA: Insufficient documentation

## 2022-01-13 DIAGNOSIS — R928 Other abnormal and inconclusive findings on diagnostic imaging of breast: Secondary | ICD-10-CM | POA: Diagnosis present

## 2022-01-14 ENCOUNTER — Encounter: Payer: Self-pay | Admitting: Obstetrics and Gynecology

## 2022-01-21 ENCOUNTER — Encounter: Payer: Self-pay | Admitting: Neurology

## 2022-01-21 ENCOUNTER — Ambulatory Visit: Payer: BC Managed Care – PPO | Admitting: Neurology

## 2022-01-21 VITALS — BP 116/68 | HR 74 | Ht 66.0 in | Wt 263.0 lb

## 2022-01-21 DIAGNOSIS — G35 Multiple sclerosis: Secondary | ICD-10-CM | POA: Diagnosis not present

## 2022-01-21 DIAGNOSIS — R269 Unspecified abnormalities of gait and mobility: Secondary | ICD-10-CM

## 2022-01-21 DIAGNOSIS — G4719 Other hypersomnia: Secondary | ICD-10-CM

## 2022-01-21 DIAGNOSIS — Z79899 Other long term (current) drug therapy: Secondary | ICD-10-CM

## 2022-01-21 DIAGNOSIS — E559 Vitamin D deficiency, unspecified: Secondary | ICD-10-CM

## 2022-01-21 DIAGNOSIS — R0683 Snoring: Secondary | ICD-10-CM

## 2022-01-21 MED ORDER — GABAPENTIN 300 MG PO CAPS: ORAL_CAPSULE | ORAL | 4 refills | Status: DC

## 2022-01-21 MED ORDER — TERIFLUNOMIDE 14 MG PO TABS: 1.0000 | ORAL_TABLET | Freq: Every day | ORAL | 4 refills | Status: DC

## 2022-01-21 NOTE — Progress Notes (Signed)
[facility]  PATIENT: Sandra Bonilla DOB: 11-10-1975  REFERRING DOCTOR OR PCP:   Dorisann Frames SOURCE: patient and medical records  _________________________________   HISTORICAL  CHIEF COMPLAINT:  Chief Complaint  Patient presents with   Follow-up    Pt alone, rm 1. Pt is on generic aubagio. Overall stable.    HISTORY OF PRESENT ILLNESS:  Sandra Bonilla is a 46 y.o. 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     Update 10/24/023 She feels her MS is mostly stable and she denies any exacerbation.   MRI 02/2020 showed no new lesions.    She is on Aubagio and tolerates it well.   Gait is ok.  Sometimes she feels she veers to the right.   Rare stumbles but no falls.  Mild intermittent right hand numbness.  Legs ok.   She has urinary frequency and is on Myrbetriq. The oxybutynin had stopped helping after a while ad she stopped.   No UTI's.    Vision is fine.         She has fatigue and notes insomnia.  She snores and occasionally wakes up with a snort.   She has gained some weight.     She denies depression but has anxiety.  She is on Buspar and lorazepam for the anxiety.  Mild cognitive fog at times.   Mild reduced focus/attention  She had bowel surgery and required a short term ed colostomy (she had diverticulosis).     EPWORTH SLEEPINESS SCALE  On a scale of 0 - 3 what is the chance of dozing:  Sitting and Reading:   1 Watching TV:    3 Sitting inactive in a public place: 0 Passenger in car for one hour: 3 Lying down to rest in the afternoon: 3 Sitting and talking to someone: 0 Sitting quietly after lunch:  0 In a car, stopped in traffic:  0  Total (out of 24):   10/24   mild ESS    MS HIstory:       About  2006, she noted gait changes and her right foot was clumsy.    She had an MRI consistent with MS and was referred to Dr. Jacolyn Reedy [facility]) who diagnosed her with MS.   She then saw Dr. Felipe Drone and then Dr. Deloria Lair at [facility]  She was reluctant  to take a medication until  2012 years ago when she started Copaxone.   She had skin reactions, she stopped after one year and switched to Aubagio (+/- late 2013).   She has tolerated it well.     Imaging MRI brain 03/06/2020:  Multiple T2/FLAIR hyperintense foci in the periventricular, juxtacortical and deep white matter of both hemispheres.  Additional foci are noted in the spinal cord adjacent to C2, right cerebral peduncle and right cerebellar hemisphere.  None of the foci appear to be acute and they do not enhance.  Compared to the MRI dated 01/20/2018, there are no new lesions.  Small developmental venous anomaly in the right medial parietal lobe  REVIEW OF SYSTEMS: Constitutional: No fevers, chills, sweats, or change in appetite   She notes fatigue and poor sleep.    Eyes: No visual changes, double vision, eye pain Ear, nose and throat: No hearing loss, ear pain, nasal congestion, sore throat Cardiovascular: No chest pain, palpitations Respiratory:  No shortness of breath at rest or with exertion.   No wheezes.  Some snoring GastrointestinaI: No nausea, vomiting, diarrhea, abdominal pain, fecal incontinence Genitourinary:  No dysuria, urinary retention or frequency.  No nocturia. Musculoskeletal:  No neck pain, back pain Integumentary: No rash, pruritus, skin lesions Neurological: as above Psychiatric: No depression at this time. Some anxiety Endocrine: No palpitations, diaphoresis, change in appetite, change in weigh or increased thirst Hematologic/Lymphatic:  No anemia, purpura, petechiae. Allergic/Immunologic: No itchy/runny eyes, nasal congestion, recent allergic reactions, rashes  ALLERGIES: Allergies  Allergen Reactions   Sulfamethoxazole-Trimethoprim Rash and Hives   Sulfa Antibiotics Other (See Comments) and Rash    HOME MEDICATIONS:  Current Outpatient Medications:    acetaminophen (TYLENOL) 325 MG tablet, Take 2 tablets (650 mg total) by mouth every 6 (six) hours as  needed for mild pain (or Fever >/= 101)., Disp: , Rfl:    busPIRone (BUSPAR) 15 MG tablet, Take 15 mg by mouth 2 (two) times daily., Disp: , Rfl:    celecoxib (CELEBREX) 200 MG capsule, SMARTSIG:2 Capsule(s) By Mouth Daily PRN, Disp: , Rfl:    chlorthalidone (HYGROTON) 25 MG tablet, Take 12.5 mg by mouth every morning., Disp: , Rfl:    cholecalciferol (VITAMIN D3) 25 MCG (1000 UT) tablet, Take 1,000 Units by mouth daily., Disp: , Rfl:    colesevelam (WELCHOL) 625 MG tablet, Take 1,250 mg by mouth 3 (three) times daily., Disp: , Rfl:    cyclobenzaprine (FLEXERIL) 5 MG tablet, Take 1 tablet (5 mg total) by mouth at bedtime as needed. (Patient taking differently: Take 5 mg by mouth at bedtime as needed for muscle spasms.), Disp: 30 tablet, Rfl: 11   Ibuprofen-Acetaminophen 125-250 MG TABS, Take by mouth., Disp: , Rfl:    levonorgestrel (MIRENA) 20 MCG/DAY IUD, 1 each by Intrauterine route once., Disp: , Rfl:    lidocaine (LIDODERM) 5 %, Place 1 patch onto the skin daily. Remove & Discard patch within 12 hours or as directed by MD, Disp: 90 patch, Rfl: 0   LORazepam (ATIVAN) 1 MG tablet, Take 1 mg by mouth 2 (two) times daily as needed for anxiety. , Disp: , Rfl:    meloxicam (MOBIC) 15 MG tablet, Take 1 tablet (15 mg total) by mouth daily. (Patient taking differently: Take 15 mg by mouth daily as needed for pain.), Disp: 30 tablet, Rfl: 5   mirabegron ER (MYRBETRIQ) 25 MG TB24 tablet, Take 1 tablet (25 mg total) by mouth daily., Disp: 90 tablet, Rfl: 1   mometasone (ELOCON) 0.1 % cream, Apply 1 application topically daily as needed (Rash). Up to 5 times per week, Disp: 45 g, Rfl: 1   Na Sulfate-K Sulfate-Mg Sulf (SUPREP BOWEL PREP KIT) 17.5-3.13-1.6 GM/177ML SOLN, Take 1 kit by mouth as directed. For colonoscopy prep, Disp: 354 mL, Rfl: 0   ondansetron (ZOFRAN-ODT) 4 MG disintegrating tablet, Take 4 mg by mouth every 6 (six) hours as needed for nausea or vomiting. , Disp: , Rfl:    Polyvinyl  Alcohol-Povidone (REFRESH OP), Place 2 drops into both eyes 4 (four) times daily as needed (dryness)., Disp: , Rfl:    Probiotic Product (PROBIOTIC PO), Take 1 capsule by mouth daily., Disp: , Rfl:    TRELEGY ELLIPTA 200-62.5-25 MCG/ACT AEPB, Inhale 1 puff into the lungs daily., Disp: , Rfl:    gabapentin (NEURONTIN) 300 MG capsule, One po qAM, one po qPM and two po qHS, Disp: 360 capsule, Rfl: 4   Teriflunomide 14 MG TABS, Take 1 tablet by mouth daily., Disp: 90 tablet, Rfl: 4  PAST MEDICAL HISTORY: Past Medical History:  Diagnosis Date   Anemia 05/2019   iron infusions  Anxiety    Asthma    Cervical high risk human papillomavirus (HPV) DNA test positive 09/2015   Cholecystitis 2006   GALBLADDER REMOVED   Depression    Diverticulitis    Diverticulosis    Dysplastic nevus 08/24/2008   right upper back lat, right upper back medial   Fibroadenoma 2016   RIGHT BREAST   Head injury 06/25/2016   History of mammogram 11/2014   FIBROADENOMA   History of Papanicolaou smear of cervix 01/24/13; 10/15/15   -/-; -/+   Hypertension    Menorrhagia 02/01/2010   D/C HYSTEROSCOPY ENDO POLYP   MS (multiple sclerosis) ([place])    Ovarian cyst 9/15; 2015-2016   LEFT - WENT TO [place]; RIGHT   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    PAST SURGICAL HISTORY: Past Surgical History:  Procedure Laterality Date   BIOPSY  03/21/2019   Procedure: BIOPSY;  Surgeon: [name], MD;  Location: [place];  Service: Gastroenterology;;   BREAST BIOPSY Right 2016   Fibroadenoma   CHOLECYSTECTOMY  2006   COLON RESECTION N/A 08/11/2019   Procedure: DIAGNOSTIC LAPAROSCOPY WITH [place] OUT, DRAIN PLACEMENT, AND LOOP ILEOSTOMY CREATION;  Surgeon: [name], MD;  Location: [facility];  Service: General;  Laterality: N/A;   COLONOSCOPY WITH PROPOFOL N/A 03/21/2019   Procedure: COLONOSCOPY WITH PROPOFOL;  Surgeon: [name], MD;  Location: [facility] END[facility]COPY;  Service: Gastroenterology;   Laterality: N/A;  ultraslim colonoscope    CYSTOSCOPY WITH INJECTION N/A 08/04/2019   Procedure: FIREFLY INJECTION;  Surgeon: Ardis Hughs, MD;  Location: [facility];  Service: Urology;  Laterality: N/A;   DILATION AND CURETTAGE, DIAGNOSTIC / THERAPEUTIC  02/01/2010   D/C HYSTEROSCOPY ENDO POLYP; [facility]   ENDOSCOPIC MUCOSAL RESECTION  03/21/2019   Procedure: ENDOSCOPIC MUCOSAL RESECTION;  Surgeon: Rush Landmark Telford Nab., MD;  Location: [place];  Service: Gastroenterology;;   ESOPHAGOGASTRODUODENOSCOPY (EGD) WITH PROPOFOL N/A 03/21/2019   Procedure: ESOPHAGOGASTRODUODENOSCOPY (EGD) WITH PROPOFOL;  Surgeon: [name], MD;  Location: [place];  Service: Gastroenterology;  Laterality: N/A;   EYE SURGERY     Lasik   FLEXIBLE SIGMOIDOSCOPY  07/31/2016   Procedure: FLEXIBLE SIGMOIDOSCOPY;  Surgeon: [name], MD;  Location: [facility];  Service: Endoscopy;;   HEMOSTASIS CLIP PLACEMENT  03/21/2019   Procedure: HEMOSTASIS CLIP PLACEMENT;  Surgeon: [name], MD;  Location: [place];  Service: Gastroenterology;;   HYSTEROSCOPY  02/01/2010   ENDO POLYP - [facility]   IR RADIOLOGIST EVAL & MGMT  11/16/2018   IR RADIOLOGIST EVAL & MGMT  09/06/2019   SCLEROTHERAPY  03/21/2019   Procedure: Clide Deutscher;  Surgeon: Mansouraty, Telford Nab., MD;  Location: [facility] END[facility]COPY;  Service: Gastroenterology;;    FAMILY HISTORY: Family History  Problem Relation Age of Onset   Breast cancer Maternal Aunt 59       has contact   Rheum arthritis Mother    Hypertension Mother    Thyroid disease Mother        HYPOTHYROIDISM   Lung cancer Mother 28   Heart disease Father    Diabetes Father    Hypertension Father    Cancer Paternal Grandmother        MELANOMA OF SKIN   Cancer Cousin        KIDNEY-COUSIN   Colon cancer Neg Hx    Stomach cancer Neg Hx    Pancreatic cancer Neg Hx    Esophageal cancer Neg Hx    Inflammatory bowel disease Neg Hx  Rectal cancer Neg Hx      SOCIAL HISTORY:  Social History   Socioeconomic History   Marital status: Divorced    Spouse name: Not on file   Number of children: 0   Years of education: 14   Highest education level: Not on file  Occupational History    Employer: [employer]  Tobacco Use   Smoking status: Former    Years: 10.00    Types: Cigarettes    Quit date: 2010    Years since quitting: 13.8   Smokeless tobacco: Never  Vaping Use   Vaping Use: Never used  Substance and Sexual Activity   Alcohol use: No   Drug use: No   Sexual activity: Yes    Partners: Male    Birth control/protection: I.U.D.    Comment: Mirena  Other Topics Concern   Not on file  Social History Narrative   Not on file   Social Determinants of Health   Financial Resource Strain: Not on file  Food Insecurity: Not on file  Transportation Needs: Not on file  Physical Activity: Not on file  Stress: Not on file  Social Connections: Not on file  Intimate Partner Violence: Not on file     PHYSICAL EXAM  Vitals:   01/21/22 1547  BP: 116/68  Pulse: 74  Weight: 263 lb (119.3 kg)  Height: _0  (1.676 m)    Body mass index is 42.45 kg/m.   General: The patient is well-developed and well-nourished and in no acute distress.     Neurologic Exam  Mental status: The patient is alert and oriented x 3 at the time of the examination. The patient has apparent normal recent and remote memory, with an apparently normal attention span and concentration ability.   Speech is normal.  Cranial nerves: Extraocular muscles are normal.  Facial strength and sensation are normal.   Trapezius is strong  Hearing is normal   Motor:  Muscle bulk is normal.   Muscle tone and strength is normal in the arms and legs exept 4+/5 right APB strength  Other:   Currently does not have a right Tinel's and Phalen's signs as did last visit.  Sensory: She has intact sensation to touch and vibration in the arms and legs  Coordination: Cerebellar  testing reveals good finger-nose-finger and heel-to-shin bilaterally.  Gait and station: Station is normal.    Gait is normal.  Tandem gait is mildly wide.  Romberg is negative.  Reflexes: Deep tendon reflexes are symmetric and normal bilaterally in arms and legs.    No clonus or spread.        DIAGNOSTIC DATA (LABS, IMAGING, TESTING) - I reviewed patient records, labs, notes, testing and imaging myself where available.  Lab Results  Component Value Date   WBC 9.4 06/24/2021   HGB 12.6 06/24/2021   HCT 38.6 06/24/2021   MCV 84 06/24/2021   PLT 352 06/24/2021      Component Value Date/Time   NA 140 02/29/2020 0810   K 3.5 02/29/2020 0810   CL 103 02/29/2020 0810   CO2 26 02/29/2020 0810   GLUCOSE 78 02/29/2020 0810   GLUCOSE 110 (H) 12/06/2019 1653   BUN 11 02/29/2020 0810   CREATININE 0.69 02/29/2020 0810   CALCIUM 8.7 02/29/2020 0810   PROT 6.8 06/24/2021 1550   ALBUMIN 4.2 06/24/2021 1550   AST 26 06/24/2021 1550   ALT 18 06/24/2021 1550   ALKPHOS 130 (H) 06/24/2021 1550   BILITOT 0.5 06/24/2021  [place] 02/29/2020 0810   GFRAA 121 02/29/2020 0810       ASSESSMENT AND PLAN  Multiple sclerosis ([place]) - Plan: CBC with Differential/Platelet, Comprehensive metabolic panel  Gait disturbance  High risk medication use - Plan: CBC with Differential/Platelet, Comprehensive metabolic panel  Vitamin D deficiency - Plan: VITAMIN D 25 Hydroxy (Vit-D Deficiency, Fractures)  Excessive daytime sleepiness - Plan: Home sleep test  Snoring - Plan: Home sleep test   1.    Continue Aubagio.    We will check lab work  2.    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  Continue  Myrbetriq for bladder.      Continue vit D supplements.  3.    Continue to be active and exercise as tolerated. 4.    She will return to see [place] in 6 months or sooner if there are new or worsening neurologic symptoms.     Vivienne Sangiovanni A. Felecia Shelling, MD,  PhD 81/84/0375, 4:36 PM Certified in Neurology, Clinical Neurophysiology, Sleep Medicine, Pain Medicine and Neuroimaging  [facility] [address], [place], [place] [address] [phone]

## 2022-01-22 LAB — CBC WITH DIFFERENTIAL/PLATELET
Basophils Absolute: 0.1 10*3/uL (ref 0.0–0.2)
Basos: 1 %
EOS (ABSOLUTE): 0.2 10*3/uL (ref 0.0–0.4)
Eos: 2 %
Hematocrit: 40.9 % (ref 34.0–46.6)
Hemoglobin: 13.2 g/dL (ref 11.1–15.9)
Immature Grans (Abs): 0.1 10*3/uL (ref 0.0–0.1)
Immature Granulocytes: 1 %
Lymphocytes Absolute: 2.9 10*3/uL (ref 0.7–3.1)
Lymphs: 23 %
MCH: 27.1 pg (ref 26.6–33.0)
MCHC: 32.3 g/dL (ref 31.5–35.7)
MCV: 84 fL (ref 79–97)
Monocytes Absolute: 1.1 10*3/uL — ABNORMAL HIGH (ref 0.1–0.9)
Monocytes: 9 %
Neutrophils Absolute: 8.5 10*3/uL — ABNORMAL HIGH (ref 1.4–7.0)
Neutrophils: 64 %
Platelets: 311 10*3/uL (ref 150–450)
RBC: 4.87 x10E6/uL (ref 3.77–5.28)
RDW: 14.5 % (ref 11.7–15.4)
WBC: 13 10*3/uL — ABNORMAL HIGH (ref 3.4–10.8)

## 2022-01-22 LAB — COMPREHENSIVE METABOLIC PANEL
ALT: 15 IU/L (ref 0–32)
AST: 18 IU/L (ref 0–40)
Albumin/Globulin Ratio: 1.6 (ref 1.2–2.2)
Albumin: 4.3 g/dL (ref 3.9–4.9)
Alkaline Phosphatase: 128 IU/L — ABNORMAL HIGH (ref 44–121)
BUN/Creatinine Ratio: 12 (ref 9–23)
BUN: 13 mg/dL (ref 6–24)
Bilirubin Total: 0.8 mg/dL (ref 0.0–1.2)
CO2: 25 mmol/L (ref 20–29)
Calcium: 9.2 mg/dL (ref 8.7–10.2)
Chloride: 101 mmol/L (ref 96–106)
Creatinine, Ser: 1.13 mg/dL — ABNORMAL HIGH (ref 0.57–1.00)
Globulin, Total: 2.7 g/dL (ref 1.5–4.5)
Glucose: 93 mg/dL (ref 70–99)
Potassium: 4.1 mmol/L (ref 3.5–5.2)
Sodium: 139 mmol/L (ref 134–144)
Total Protein: 7 g/dL (ref 6.0–8.5)
eGFR: 60 mL/min/{1.73_m2} (ref >59)

## 2022-01-22 LAB — VITAMIN D 25 HYDROXY (VIT D DEFICIENCY, FRACTURES): Vit D, 25-Hydroxy: 59.7 ng/mL (ref 30.0–100.0)

## 2022-01-29 ENCOUNTER — Encounter: Payer: Self-pay | Admitting: Gastroenterology

## 2022-02-05 ENCOUNTER — Encounter: Payer: Self-pay | Admitting: Gastroenterology

## 2022-02-05 ENCOUNTER — Ambulatory Visit (AMBULATORY_SURGERY_CENTER): Payer: BC Managed Care – PPO | Admitting: Gastroenterology

## 2022-02-05 ENCOUNTER — Other Ambulatory Visit (HOSPITAL_COMMUNITY): Payer: Self-pay

## 2022-02-05 VITALS — BP 129/81 | HR 74 | Temp 96.4°F | Resp 15 | Ht 65.0 in | Wt 262.0 lb

## 2022-02-05 DIAGNOSIS — K921 Melena: Secondary | ICD-10-CM

## 2022-02-05 DIAGNOSIS — K219 Gastro-esophageal reflux disease without esophagitis: Secondary | ICD-10-CM | POA: Diagnosis not present

## 2022-02-05 DIAGNOSIS — K649 Unspecified hemorrhoids: Secondary | ICD-10-CM | POA: Diagnosis not present

## 2022-02-05 DIAGNOSIS — T184XXA Foreign body in colon, initial encounter: Secondary | ICD-10-CM | POA: Diagnosis not present

## 2022-02-05 DIAGNOSIS — K5730 Diverticulosis of large intestine without perforation or abscess without bleeding: Secondary | ICD-10-CM

## 2022-02-05 DIAGNOSIS — R12 Heartburn: Secondary | ICD-10-CM

## 2022-02-05 DIAGNOSIS — R140 Abdominal distension (gaseous): Secondary | ICD-10-CM

## 2022-02-05 DIAGNOSIS — K625 Hemorrhage of anus and rectum: Secondary | ICD-10-CM

## 2022-02-05 DIAGNOSIS — K449 Diaphragmatic hernia without obstruction or gangrene: Secondary | ICD-10-CM

## 2022-02-05 MED ORDER — SODIUM CHLORIDE 0.9 % IV SOLN: 500.0000 mL | Freq: Once | INTRAVENOUS | Status: DC

## 2022-02-05 MED ORDER — ESOMEPRAZOLE MAGNESIUM 40 MG PO CPDR: 40.0000 mg | DELAYED_RELEASE_CAPSULE | Freq: Two times a day (BID) | ORAL | 3 refills | Status: DC

## 2022-02-05 NOTE — Progress Notes (Signed)
RN attempted to schedule repeat EGD in 4 months per Dr. Donneta Romberg written instructions.  Schedule is not out yet.

## 2022-02-05 NOTE — Progress Notes (Signed)
VS by DT    

## 2022-02-05 NOTE — Patient Instructions (Signed)
Await pathology results.  A prescription for Nexium '40mg'$  twice daily for 2 months, then once a day thereafter has been sent to your pharmacy.  Please call Dr. Donneta Romberg office to schedule a repeat upper endoscopy in 4 months.  YOU HAD AN ENDOSCOPIC PROCEDURE TODAY AT [facility]:   Refer to the procedure report that was given to you for any specific questions about what was found during the examination.  If the procedure report does not answer your questions, please call your gastroenterologist to clarify.  If you requested that your care partner not be given the details of your procedure findings, then the procedure report has been included in a sealed envelope for you to review at your convenience later.  YOU SHOULD EXPECT: Some feelings of bloating in the abdomen. Passage of more gas than usual.  Walking can help get rid of the air that was put into your GI tract during the procedure and reduce the bloating. If you had a lower endoscopy (such as a colonoscopy or flexible sigmoidoscopy) you may notice spotting of blood in your stool or on the toilet paper. If you underwent a bowel prep for your procedure, you may not have a normal bowel movement for a few days.  Please Note:  You might notice some irritation and congestion in your nose or some drainage.  This is from the oxygen used during your procedure.  There is no need for concern and it should clear up in a day or so.  SYMPTOMS TO REPORT IMMEDIATELY:  Following lower endoscopy (colonoscopy or flexible sigmoidoscopy):  Excessive amounts of blood in the stool  Significant tenderness or worsening of abdominal pains  Swelling of the abdomen that is new, acute  Fever of 100F or higher  Following upper endoscopy (EGD)  Vomiting of blood or coffee ground material  New chest pain or pain under the shoulder blades  Painful or persistently difficult swallowing  New shortness of breath  Fever of 100F or higher  Black,  tarry-looking stools  For urgent or emergent issues, a gastroenterologist can be reached at any hour by calling [phone]. Do not use MyChart messaging for urgent concerns.    DIET:  We do recommend a small meal at first, but then you may proceed to your regular diet.  Drink plenty of fluids but you should avoid alcoholic beverages for 24 hours.  ACTIVITY:  You should plan to take it easy for the rest of today and you should NOT DRIVE or use heavy machinery until tomorrow (because of the sedation medicines used during the test).    FOLLOW UP: Our staff will call the number listed on your records the next business day following your procedure.  We will call around 7:15- 8:00 am to check on you and address any questions or concerns that you may have regarding the information given to you following your procedure. If we do not reach you, we will leave a message.     If any biopsies were taken you will be contacted by phone or by letter within the next 1-3 weeks.  Please call us at [phone] if you have not heard about the biopsies in 3 weeks.    SIGNATURES/CONFIDENTIALITY: You and/or your care partner have signed paperwork which will be entered into your electronic medical record.  These signatures attest to the fact that that the information above on your After Visit Summary has been reviewed and is understood.  Full responsibility of the confidentiality  of this discharge information lies with you and/or your care-partner.  

## 2022-02-05 NOTE — Op Note (Signed)
[place] Patient Name: Sandra Bonilla Procedure Date: 02/05/2022 8:24 AM MRN: [id number] Endoscopist: Justice Britain , MD, [id number] Age: 46 Referring MD:  Date of Birth: 09/01/1975 Gender: Female Account #: [id number] Procedure:                Colonoscopy Indications:              Surveillance: History of piecemeal removal adenoma                            on last colonoscopy 3 yrs, Incidental -                            Hematochezia, Incidental - Post-operative assessment Medicines:                Monitored Anesthesia Care Procedure:                Pre-Anesthesia Assessment:                           - Prior to the procedure, a History and Physical                            was performed, and patient medications and                            allergies were reviewed. The patient's tolerance of                            previous anesthesia was also reviewed. The risks                            and benefits of the procedure and the sedation                            options and risks were discussed with the patient.                            All questions were answered, and informed consent                            was obtained. Prior Anticoagulants: The patient has                            taken no anticoagulant or antiplatelet agents. ASA                            Grade Assessment: III - A patient with severe                            systemic disease. After reviewing the risks and                            benefits, the patient was deemed in satisfactory  condition to undergo the procedure.                           After obtaining informed consent, the colonoscope                            was passed under direct vision. Throughout the                            procedure, the patient's blood pressure, pulse, and                            oxygen saturations were monitored continuously. The                            CF  [id number] #[id number] was introduced through the anus                            and advanced to the 5 cm into the ileum. The                            colonoscopy was performed without difficulty. The                            patient tolerated the procedure. The quality of the                            bowel preparation was good. The terminal ileum,                            ileocecal valve, appendiceal orifice, and rectum                            were photographed. Scope In: 8:47:40 AM Scope Out: 9:01:37 AM Scope Withdrawal Time: 0 hours 11 minutes 29 seconds  Total Procedure Duration: 0 hours 13 minutes 57 seconds  Findings:                 The digital rectal exam findings include                            hemorrhoids. Pertinent negatives include no                            palpable rectal lesions.                           There was evidence of a prior functional end-to-end                            colo-colonic anastomosis in the recto-sigmoid colon                            at 19 cm. This was patent and was characterized by  mostly healthy appearing mucosa and a visible                            suture with some irritation at this area (no overt                            bleeding). The anastomosis was traversed. Removal                            of a suture was accomplished with a regular forceps.                           The terminal ileum and ileocecal valve appeared                            normal.                           Multiple small-mouthed diverticula were found in                            the sigmoid colon and descending colon.                           Normal mucosa was found in the entire colon                            otherwise.                           Non-bleeding non-thrombosed external and internal                            hemorrhoids were found during retroflexion, during                            perianal exam and  during digital exam. The                            hemorrhoids were Grade II (internal hemorrhoids                            that prolapse but reduce spontaneously). Complications:            No immediate complications. Estimated Blood Loss:     Estimated blood loss was minimal. Impression:               - Hemorrhoids found on digital rectal exam.                           - Patent functional end-to-end colo-colonic                            anastomosis, characterized by mostly healthy  appearing mucosa and a visible suture (this was                            removed).                           - The examined portion of the ileum was normal.                           - Diverticulosis in the sigmoid colon and in the                            descending colon.                           - Normal mucosa in the entire examined colon                            otherwise.                           - Non-bleeding non-thrombosed external and internal                            hemorrhoids. Recommendation:           - The patient will be observed post-procedure,                            until all discharge criteria are met.                           - Discharge patient to home.                           - Patient has a contact number available for                            emergencies. The signs and symptoms of potential                            delayed complications were discussed with the                            patient. Return to normal activities tomorrow.                            Written discharge instructions were provided to the                            patient.                           - High fiber diet.                           - Use FiberCon 1-2 tablets PO daily.                           -  Continue present medications otherwise.                           - Can discuss case with my partners to consider                            hemorrhoidal  banding if issues of bleeding do not                            resolve with the removal of the suture.                           - Repeat colonoscopy in 5 years for surveillance                            and maintain 5-year plan moving forward at maximum                            due to history of advanced TVA previously removed.                           - The findings and recommendations were discussed                            with the patient.                           - The findings and recommendations were discussed                            with the patient's family. Justice Britain, MD 02/05/2022 9:18:35 AM

## 2022-02-05 NOTE — Progress Notes (Signed)
GASTROENTEROLOGY PROCEDURE H&P NOTE   Primary Care Physician: [name], MD  HPI: Sandra Bonilla is a 46 y.o. female who presents for EGD/Colonoscopy for progressive GERD symptoms and then history of previous TVA of the rectum here for surveillance.  Past Medical History:  Diagnosis Date   Anemia 05/2019   iron infusions   Anxiety    Asthma    Cervical high risk human papillomavirus (HPV) DNA test positive 09/2015   Cholecystitis 2006   GALBLADDER REMOVED   Depression    Diverticulitis    Diverticulosis    Dysplastic nevus 08/24/2008   right upper back lat, right upper back medial   Fibroadenoma 2016   RIGHT BREAST   Head injury 06/25/2016   History of mammogram 11/2014   FIBROADENOMA   History of Papanicolaou smear of cervix 01/24/13; 10/15/15   -/-; -/+   Hypertension    Menorrhagia 02/01/2010   D/C HYSTEROSCOPY ENDO POLYP   MS (multiple sclerosis) ([place])    Ovarian cyst 9/15; 2015-2016   LEFT - WENT TO [place]; RIGHT   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   Past Surgical History:  Procedure Laterality Date   BIOPSY  03/21/2019   Procedure: BIOPSY;  Surgeon: [name], MD;  Location: [place];  Service: Gastroenterology;;   BREAST BIOPSY Right 2016   Fibroadenoma   CHOLECYSTECTOMY  2006   COLON RESECTION N/A 08/11/2019   Procedure: DIAGNOSTIC LAPAROSCOPY WITH [place] OUT, DRAIN PLACEMENT, AND LOOP ILEOSTOMY CREATION;  Surgeon: [name], MD;  Location: [facility];  Service: General;  Laterality: N/A;   COLONOSCOPY WITH PROPOFOL N/A 03/21/2019   Procedure: COLONOSCOPY WITH PROPOFOL;  Surgeon: [name], MD;  Location: [facility];  Service: Gastroenterology;  Laterality: N/A;  ultraslim colonoscope    CYSTOSCOPY WITH INJECTION N/A 08/04/2019   Procedure: FIREFLY INJECTION;  Surgeon: Ardis Hughs, MD;  Location: [facility];  Service: Urology;  Laterality: N/A;   DILATION AND CURETTAGE, DIAGNOSTIC / THERAPEUTIC   02/01/2010   D/C HYSTEROSCOPY ENDO POLYP; [facility]   ENDOSCOPIC MUCOSAL RESECTION  03/21/2019   Procedure: ENDOSCOPIC MUCOSAL RESECTION;  Surgeon: Rush Landmark Telford Nab., MD;  Location: [facility];  Service: Gastroenterology;;   ESOPHAGOGASTRODUODENOSCOPY (EGD) WITH PROPOFOL N/A 03/21/2019   Procedure: ESOPHAGOGASTRODUODENOSCOPY (EGD) WITH PROPOFOL;  Surgeon: [name], MD;  Location: [place];  Service: Gastroenterology;  Laterality: N/A;   EYE SURGERY     Lasik   FLEXIBLE SIGMOIDOSCOPY  07/31/2016   Procedure: FLEXIBLE SIGMOIDOSCOPY;  Surgeon: [name], MD;  Location: [facility];  Service: Endoscopy;;   HEMOSTASIS CLIP PLACEMENT  03/21/2019   Procedure: HEMOSTASIS CLIP PLACEMENT;  Surgeon: [name], MD;  Location: [place];  Service: Gastroenterology;;   HYSTEROSCOPY  02/01/2010   ENDO POLYP - [facility]   IR RADIOLOGIST EVAL & MGMT  11/16/2018   IR RADIOLOGIST EVAL & MGMT  09/06/2019   SCLEROTHERAPY  03/21/2019   Procedure: SCLEROTHERAPY;  Surgeon: Mansouraty, Telford Nab., MD;  Location: [facility];  Service: Gastroenterology;;   Current Outpatient Medications  Medication Sig Dispense Refill   acetaminophen (TYLENOL) 325 MG tablet Take 2 tablets (650 mg total) by mouth every 6 (six) hours as needed for mild pain (or Fever >/= 101).     busPIRone (BUSPAR) 15 MG tablet Take 15 mg by mouth 2 (two) times daily.     celecoxib (CELEBREX) 200 MG capsule SMARTSIG:2 Capsule(s) By Mouth Daily PRN     chlorthalidone (HYGROTON) 25 MG tablet Take 12.5  mg by mouth every morning.     cholecalciferol (VITAMIN D3) 25 MCG (1000 UT) tablet Take 1,000 Units by mouth daily.     colesevelam (WELCHOL) 625 MG tablet Take 1,250 mg by mouth 3 (three) times daily.     cyclobenzaprine (FLEXERIL) 5 MG tablet Take 1 tablet (5 mg total) by mouth at bedtime as needed. (Patient taking differently: Take 5 mg by mouth at bedtime as needed for muscle spasms.) 30 tablet 11   gabapentin  (NEURONTIN) 300 MG capsule One po qAM, one po qPM and two po qHS 360 capsule 4   Ibuprofen-Acetaminophen 125-250 MG TABS Take by mouth.     levonorgestrel (MIRENA) 20 MCG/DAY IUD 1 each by Intrauterine route once.     lidocaine (LIDODERM) 5 % Place 1 patch onto the skin daily. Remove & Discard patch within 12 hours or as directed by MD 90 patch 0   LORazepam (ATIVAN) 1 MG tablet Take 1 mg by mouth 2 (two) times daily as needed for anxiety.      meloxicam (MOBIC) 15 MG tablet Take 1 tablet (15 mg total) by mouth daily. (Patient taking differently: Take 15 mg by mouth daily as needed for pain.) 30 tablet 5   mirabegron ER (MYRBETRIQ) 25 MG TB24 tablet Take 1 tablet (25 mg total) by mouth daily. 90 tablet 1   mometasone (ELOCON) 0.1 % cream Apply 1 application topically daily as needed (Rash). Up to 5 times per week 45 g 1   Na Sulfate-K Sulfate-Mg Sulf (SUPREP BOWEL PREP KIT) 17.5-3.13-1.6 GM/177ML SOLN Take 1 kit by mouth as directed. For colonoscopy prep 354 mL 0   ondansetron (ZOFRAN-ODT) 4 MG disintegrating tablet Take 4 mg by mouth every 6 (six) hours as needed for nausea or vomiting.      Polyvinyl Alcohol-Povidone (REFRESH OP) Place 2 drops into both eyes 4 (four) times daily as needed (dryness).     Probiotic Product (PROBIOTIC PO) Take 1 capsule by mouth daily.     Teriflunomide 14 MG TABS Take 1 tablet by mouth daily. 90 tablet 4   TRELEGY ELLIPTA 200-62.5-25 MCG/ACT AEPB Inhale 1 puff into the lungs daily.     Current Facility-Administered Medications  Medication Dose Route Frequency Provider Last Rate Last Admin   0.9 %  sodium chloride infusion  500 mL Intravenous Once Mansouraty, Telford Nab., MD        Current Outpatient Medications:    acetaminophen (TYLENOL) 325 MG tablet, Take 2 tablets (650 mg total) by mouth every 6 (six) hours as needed for mild pain (or Fever >/= 101)., Disp: , Rfl:    busPIRone (BUSPAR) 15 MG tablet, Take 15 mg by mouth 2 (two) times daily., Disp: , Rfl:     celecoxib (CELEBREX) 200 MG capsule, SMARTSIG:2 Capsule(s) By Mouth Daily PRN, Disp: , Rfl:    chlorthalidone (HYGROTON) 25 MG tablet, Take 12.5 mg by mouth every morning., Disp: , Rfl:    cholecalciferol (VITAMIN D3) 25 MCG (1000 UT) tablet, Take 1,000 Units by mouth daily., Disp: , Rfl:    colesevelam (WELCHOL) 625 MG tablet, Take 1,250 mg by mouth 3 (three) times daily., Disp: , Rfl:    cyclobenzaprine (FLEXERIL) 5 MG tablet, Take 1 tablet (5 mg total) by mouth at bedtime as needed. (Patient taking differently: Take 5 mg by mouth at bedtime as needed for muscle spasms.), Disp: 30 tablet, Rfl: 11   gabapentin (NEURONTIN) 300 MG capsule, One po qAM, one po qPM and two po qHS,  Disp: 360 capsule, Rfl: 4   Ibuprofen-Acetaminophen 125-250 MG TABS, Take by mouth., Disp: , Rfl:    levonorgestrel (MIRENA) 20 MCG/DAY IUD, 1 each by Intrauterine route once., Disp: , Rfl:    lidocaine (LIDODERM) 5 %, Place 1 patch onto the skin daily. Remove & Discard patch within 12 hours or as directed by MD, Disp: 90 patch, Rfl: 0   LORazepam (ATIVAN) 1 MG tablet, Take 1 mg by mouth 2 (two) times daily as needed for anxiety. , Disp: , Rfl:    meloxicam (MOBIC) 15 MG tablet, Take 1 tablet (15 mg total) by mouth daily. (Patient taking differently: Take 15 mg by mouth daily as needed for pain.), Disp: 30 tablet, Rfl: 5   mirabegron ER (MYRBETRIQ) 25 MG TB24 tablet, Take 1 tablet (25 mg total) by mouth daily., Disp: 90 tablet, Rfl: 1   mometasone (ELOCON) 0.1 % cream, Apply 1 application topically daily as needed (Rash). Up to 5 times per week, Disp: 45 g, Rfl: 1   Na Sulfate-K Sulfate-Mg Sulf (SUPREP BOWEL PREP KIT) 17.5-3.13-1.6 GM/177ML SOLN, Take 1 kit by mouth as directed. For colonoscopy prep, Disp: 354 mL, Rfl: 0   ondansetron (ZOFRAN-ODT) 4 MG disintegrating tablet, Take 4 mg by mouth every 6 (six) hours as needed for nausea or vomiting. , Disp: , Rfl:    Polyvinyl Alcohol-Povidone (REFRESH OP), Place 2 drops into both  eyes 4 (four) times daily as needed (dryness)., Disp: , Rfl:    Probiotic Product (PROBIOTIC PO), Take 1 capsule by mouth daily., Disp: , Rfl:    Teriflunomide 14 MG TABS, Take 1 tablet by mouth daily., Disp: 90 tablet, Rfl: 4   TRELEGY ELLIPTA 200-62.5-25 MCG/ACT AEPB, Inhale 1 puff into the lungs daily., Disp: , Rfl:   Current Facility-Administered Medications:    0.9 %  sodium chloride infusion, 500 mL, Intravenous, Once, Mansouraty, Telford Nab., MD Allergies  Allergen Reactions   Sulfamethoxazole-Trimethoprim Rash and Hives   Sulfa Antibiotics Other (See Comments) and Rash   Family History  Problem Relation Age of Onset   Breast cancer Maternal Aunt 38       has contact   Rheum arthritis Mother    Hypertension Mother    Thyroid disease Mother        HYPOTHYROIDISM   Lung cancer Mother 33   Heart disease Father    Diabetes Father    Hypertension Father    Cancer Paternal Grandmother        MELANOMA OF SKIN   Cancer Cousin        KIDNEY-COUSIN   Colon cancer Neg Hx    Stomach cancer Neg Hx    Pancreatic cancer Neg Hx    Esophageal cancer Neg Hx    Inflammatory bowel disease Neg Hx    Rectal cancer Neg Hx    Social History   Socioeconomic History   Marital status: Divorced    Spouse name: Not on file   Number of children: 0   Years of education: 14   Highest education level: Not on file  Occupational History    Employer: [employer]  Tobacco Use   Smoking status: Former    Years: 10.00    Types: Cigarettes    Quit date: 2010    Years since quitting: 13.8   Smokeless tobacco: Never  Vaping Use   Vaping Use: Never used  Substance and Sexual Activity   Alcohol use: No   Drug use: No   Sexual activity: Yes  Partners: Male    Birth control/protection: I.U.D.    Comment: Mirena  Other Topics Concern   Not on file  Social History Narrative   Not on file   Social Determinants of Health   Financial Resource Strain: Not on file  Food Insecurity: Not on file   Transportation Needs: Not on file  Physical Activity: Not on file  Stress: Not on file  Social Connections: Not on file  Intimate Partner Violence: Not on file    Physical Exam: Today's Vitals   02/05/22 0804 02/05/22 0806  BP: 139/73   Pulse: 95   Temp: (!) 96.4 F (35.8 C) (!) 96.4 F (35.8 C)  TempSrc: Skin   SpO2: 96%   Weight: 262 lb (118.8 kg)   Height: 5' 5" (1.651 m)    Body mass index is 43.6 kg/m. GEN: NAD EYE: Sclerae anicteric ENT: MMM CV: Non-tachycardic GI: Soft, NT/ND NEURO:  Alert & Oriented x 3  Lab Results: No results for input(s): "WBC", "HGB", "HCT", "PLT" in the last 72 hours. BMET No results for input(s): "NA", "K", "CL", "CO2", "GLUCOSE", "BUN", "CREATININE", "CALCIUM" in the last 72 hours. LFT No results for input(s): "PROT", "ALBUMIN", "AST", "ALT", "ALKPHOS", "BILITOT", "BILIDIR", "IBILI" in the last 72 hours. PT/INR No results for input(s): "LABPROT", "INR" in the last 72 hours.   Impression / Plan: This is a 46 y.o.female who presents for EGD/Colonoscopy for progressive GERD symptoms and then history of previous TVA of the rectum here for surveillance.  The risks and benefits of endoscopic evaluation/treatment were discussed with the patient and/or family; these include but are not limited to the risk of perforation, infection, bleeding, missed lesions, lack of diagnosis, severe illness requiring hospitalization, as well as anesthesia and sedation related illnesses.  The patient's history has been reviewed, patient examined, no change in status, and deemed stable for procedure.  The patient and/or family is agreeable to proceed.    Justice Britain, MD [facility] Advanced [facility]oscopy Office # [phone]

## 2022-02-05 NOTE — Op Note (Signed)
[facility] Patient Name: Sandra Bonilla Procedure Date: 02/05/2022 8:32 AM MRN: [id number] Endoscopist: Justice Britain , MD, [id number] Age: 46 Referring MD:  Date of Birth: 24-Mar-1976 Gender: Female Account #: [id number] Procedure:                Upper GI endoscopy Indications:              Heartburn, Gastro-esophageal reflux disease Medicines:                Monitored Anesthesia Care Procedure:                Pre-Anesthesia Assessment:                           - Prior to the procedure, a History and Physical                            was performed, and patient medications and                            allergies were reviewed. The patient's tolerance of                            previous anesthesia was also reviewed. The risks                            and benefits of the procedure and the sedation                            options and risks were discussed with the patient.                            All questions were answered, and informed consent                            was obtained. Prior Anticoagulants: The patient has                            taken no anticoagulant or antiplatelet agents. ASA                            Grade Assessment: III - A patient with severe                            systemic disease. After reviewing the risks and                            benefits, the patient was deemed in satisfactory                            condition to undergo the procedure.                           After obtaining informed consent, the endoscope was  passed under direct vision. Throughout the                            procedure, the patient's blood pressure, pulse, and                            oxygen saturations were monitored continuously. The                            Endoscope was introduced through the mouth, and                            advanced to the second part of duodenum. The upper                             GI endoscopy was accomplished without difficulty.                            The patient tolerated the procedure. Scope In: Scope Out: Findings:                 No gross lesions were noted in the proximal                            esophagus and in the mid esophagus. Biopsies were                            taken with a cold forceps for histology to rule out                            EoE/LoE.                           LA Grade B (one or more mucosal breaks greater than                            5 mm, not extending between the tops of two mucosal                            folds) esophagitis with no bleeding was found in                            the distal esophagus.                           The Z-line was irregular and was found 35 cm from                            the incisors.                           A 1 cm hiatal hernia was present.  No gross lesions were noted in the entire examined                            stomach.                           No gross lesions were noted in the duodenal bulb,                            in the first portion of the duodenum and in the                            second portion of the duodenum. Complications:            No immediate complications. Estimated Blood Loss:     Estimated blood loss was minimal. Impression:               - No gross lesions in the proximal esophagus and in                            the mid esophagus. Biopsied.                           - LA Grade B esophagitis with no bleeding distally.                           - Z-line irregular, 35 cm from the incisors.                           - 1 cm hiatal hernia.                           - No gross lesions in the entire stomach.                           - No gross lesions in the duodenal bulb, in the                            first portion of the duodenum and in the second                            portion of the duodenum. Recommendation:            - Proceed to scheduled colonoscopy.                           - Recommend Nexium 40 mg twice daily for 64-month                            and then once daily thereafter.                           - Continue present medications.                           -  Await pathology results.                           - Repeat upper endoscopy in 4 months to check                            healing of esophagitis.                           - The findings and recommendations were discussed                            with the patient.                           - The findings and recommendations were discussed                            with the patient's family. Justice Britain, MD 02/05/2022 9:08:22 AM

## 2022-02-05 NOTE — Progress Notes (Signed)
Report to PACU, RN, vss, BBS= Clear.  

## 2022-02-05 NOTE — Progress Notes (Signed)
Called to room to assist during endoscopic procedure.  Patient ID and intended procedure confirmed with present staff. Received instructions for my participation in the procedure from the performing physician.  

## 2022-02-06 ENCOUNTER — Telehealth: Payer: Self-pay | Admitting: *Deleted

## 2022-02-06 ENCOUNTER — Other Ambulatory Visit (HOSPITAL_COMMUNITY): Payer: Self-pay

## 2022-02-06 ENCOUNTER — Telehealth: Payer: Self-pay

## 2022-02-06 NOTE — Telephone Encounter (Signed)
  Follow up Call-     02/05/2022    8:06 AM  Call back number  Post procedure Call Back phone  # [phone]  Permission to leave phone message Yes     Patient questions:  Do you have a fever, pain , or abdominal swelling? No. Pain Score  0 *  Have you tolerated food without any problems? Yes.    Have you been able to return to your normal activities? Yes.    Do you have any questions about your discharge instructions: Diet   No. Medications  No. Follow up visit  No.  Do you have questions or concerns about your Care? No.  Actions: * If pain score is 4 or above: No action needed, pain <4.

## 2022-02-06 NOTE — Telephone Encounter (Signed)
Pharmacy Patient Advocate Encounter  Prior Authorization for Esomeprazole '40mg'$  has been approved.    PA# [id number] Effective dates: 02/05/2022 through 02/06/2023     Patient Advocate Encounter   Received notification from [place] that prior authorization for Esomeprazole '40mg'$  is required.   PA submitted on 02/05/2022 Key [id number] Status is pending      Joneen Boers, [place] Patient Advocate Specialist [place] Patient Advocate Team Direct Number: [phone] Fax: [phone]

## 2022-02-07 ENCOUNTER — Encounter: Payer: Self-pay | Admitting: Gastroenterology

## 2022-02-12 ENCOUNTER — Encounter: Payer: Self-pay | Admitting: Neurology

## 2022-02-18 ENCOUNTER — Ambulatory Visit: Payer: BC Managed Care – PPO | Admitting: Neurology

## 2022-02-18 DIAGNOSIS — G4733 Obstructive sleep apnea (adult) (pediatric): Secondary | ICD-10-CM

## 2022-02-18 DIAGNOSIS — G4719 Other hypersomnia: Secondary | ICD-10-CM

## 2022-02-18 DIAGNOSIS — R0683 Snoring: Secondary | ICD-10-CM

## 2022-03-04 NOTE — Progress Notes (Unsigned)
   [facility]  HOME SLEEP STUDY  STUDY DATE: 02/28/2022 PATIENT NAME: Sandra Bonilla DOB: September 21, 1975 MRN: [id number]  ORDERING CLINICIAN: Richard A. Epimenio Foot, MD, PhD Interpreting CLINICIAN: Richard A. Epimenio Foot, MD. PhD   CLINICAL INFORMATION: 46 year old woman with multiple sclerosis, snoring and mild excessive daytime sleepiness.   IMPRESSION:  Normal sleep efficiency.  Normal percent stage REM sleep Mild obstructive sleep apnea with a pAHI 3% of 11.9/h No significant nocturnal hypoxemia.   RECOMMENDATION: Consider weight loss and/or an oral appliance for the mild OSA.  Since she also has mild excessive daytime sleepiness,AutoPap could also be considered. Follow-up with Dr. Epimenio Foot.   INTERPRETING PHYSICIAN:   Richard A. Epimenio Foot, MD, PhD, [facility] Certified in Neurology, Clinical Neurophysiology, Sleep Medicine, Pain Medicine and Neuroimaging  [facility] [address], [address] [place], [place] [address] [phone]

## 2022-03-05 ENCOUNTER — Encounter: Payer: Self-pay | Admitting: Neurology

## 2022-03-05 DIAGNOSIS — G4733 Obstructive sleep apnea (adult) (pediatric): Secondary | ICD-10-CM | POA: Insufficient documentation

## 2022-03-05 DIAGNOSIS — R0683 Snoring: Secondary | ICD-10-CM | POA: Insufficient documentation

## 2022-03-05 DIAGNOSIS — G4719 Other hypersomnia: Secondary | ICD-10-CM | POA: Insufficient documentation

## 2022-03-07 ENCOUNTER — Encounter: Payer: Self-pay | Admitting: Gastroenterology

## 2022-03-11 ENCOUNTER — Telehealth: Payer: Self-pay

## 2022-03-11 NOTE — Telephone Encounter (Signed)
Sandra Bonilla, I recommend the patient undergo SIBO breath testing. After SIBO breath testing, should she continue to have issues then Xifaxan therapy may be considered for IBS-D. Thanks. GM

## 2022-03-11 NOTE — Telephone Encounter (Signed)
Sandra Bonilla  to [facility] Clinical Pool (supporting [name], MD)      03/07/22  9:24 AM I have been taking the I bgard medication since my colonoscopy in November but I still have to run to the restroom after everytime I eat something. Is there something else that could possible help with this issue?       03/07/22  9:29 AM You routed this conversation to Mansouraty, Telford Nab., MD March 11, 2022 Mansouraty, Telford Nab., MD  to Me     03/11/22  5:40 AM Note Kunio Cummiskey, I recommend the patient undergo SIBO breath testing. After SIBO breath testing, should she continue to have issues then Xifaxan therapy may be considered for IBS-D. Thanks. GM

## 2022-03-12 NOTE — Telephone Encounter (Signed)
Left message on machine to call back  

## 2022-03-20 NOTE — Telephone Encounter (Signed)
Left message on machine to call back  

## 2022-03-21 NOTE — Telephone Encounter (Signed)
The pt has been advised of the recommendation for SIBO.  She agrees and the kit has been left at the front desk for pick up on the 2nd floor.  Form faxed to [employer] with insurance information.   

## 2022-04-03 NOTE — Telephone Encounter (Signed)
PT is coming to get SIBO kit today

## 2022-04-21 ENCOUNTER — Ambulatory Visit: Payer: BC Managed Care – PPO | Admitting: Dermatology

## 2022-04-21 ENCOUNTER — Other Ambulatory Visit: Payer: Self-pay

## 2022-04-21 ENCOUNTER — Encounter: Payer: Self-pay | Admitting: Gastroenterology

## 2022-04-21 DIAGNOSIS — D485 Neoplasm of uncertain behavior of skin: Secondary | ICD-10-CM | POA: Diagnosis not present

## 2022-04-21 DIAGNOSIS — L578 Other skin changes due to chronic exposure to nonionizing radiation: Secondary | ICD-10-CM | POA: Diagnosis not present

## 2022-04-21 DIAGNOSIS — L821 Other seborrheic keratosis: Secondary | ICD-10-CM

## 2022-04-21 DIAGNOSIS — L814 Other melanin hyperpigmentation: Secondary | ICD-10-CM

## 2022-04-21 DIAGNOSIS — L820 Inflamed seborrheic keratosis: Secondary | ICD-10-CM

## 2022-04-21 DIAGNOSIS — Z1283 Encounter for screening for malignant neoplasm of skin: Secondary | ICD-10-CM | POA: Diagnosis not present

## 2022-04-21 DIAGNOSIS — D229 Melanocytic nevi, unspecified: Secondary | ICD-10-CM

## 2022-04-21 DIAGNOSIS — R140 Abdominal distension (gaseous): Secondary | ICD-10-CM

## 2022-04-21 MED ORDER — ESOMEPRAZOLE MAGNESIUM 40 MG PO CPDR: 40.0000 mg | DELAYED_RELEASE_CAPSULE | Freq: Two times a day (BID) | ORAL | 3 refills | Status: DC

## 2022-04-21 NOTE — Patient Instructions (Addendum)
Wound Care Instructions  Cleanse wound gently with soap and water once a day then pat dry with clean gauze. Apply a thin coat of Petrolatum (petroleum jelly, "Vaseline") over the wound (unless you have an allergy to this). We recommend that you use a new, sterile tube of Vaseline. Do not pick or remove scabs. Do not remove the yellow or white "healing tissue" from the base of the wound.  Cover the wound with fresh, clean, nonstick gauze and secure with paper tape. You may use Band-Aids in place of gauze and tape if the wound is small enough, but would recommend trimming much of the tape off as there is often too much. Sometimes Band-Aids can irritate the skin.  You should call the office for your biopsy report after 1 week if you have not already been contacted.  If you experience any problems, such as abnormal amounts of bleeding, swelling, significant bruising, significant pain, or evidence of infection, please call the office immediately.  FOR ADULT SURGERY PATIENTS: If you need something for pain relief you may take 1 extra strength Tylenol (acetaminophen) AND 2 Ibuprofen (200mg each) together every 4 hours as needed for pain. (do not take these if you are allergic to them or if you have a reason you should not take them.) Typically, you may only need pain medication for 1 to 3 days.     Due to recent changes in healthcare laws, you may see results of your pathology and/or laboratory studies on MyChart before the doctors have had a chance to review them. We understand that in some cases there may be results that are confusing or concerning to you. Please understand that not all results are received at the same time and often the doctors may need to interpret multiple results in order to provide you with the best plan of care or course of treatment. Therefore, we ask that you please give us 2 business days to thoroughly review all your results before contacting the office for clarification. Should  we see a critical lab result, you will be contacted sooner.   If You Need Anything After Your Visit  If you have any questions or concerns for your doctor, please call our main line at [phone] and press option 4 to reach your doctor's medical assistant. If no one answers, please leave a voicemail as directed and we will return your call as soon as possible. Messages left after 4 pm will be answered the following business day.   You may also send us a message via MyChart. We typically respond to MyChart messages within 1-2 business days.  For prescription refills, please ask your pharmacy to contact our office. Our fax number is [phone].  If you have an urgent issue when the clinic is closed that cannot wait until the next business day, you can page your doctor at the number below.    Please note that while we do our best to be available for urgent issues outside of office hours, we are not available 24/7.   If you have an urgent issue and are unable to reach us, you may choose to seek medical care at your doctor's office, retail clinic, urgent care center, or emergency room.  If you have a medical emergency, please immediately call 911 or go to the emergency department.  Pager Numbers  - Dr. Kowalski: [phone]  - Dr. Moye: [phone]  - Dr. Stewart: [phone]  In the event of inclement weather, please call our main line at   [phone] for an update on the status of any delays or closures.  Dermatology Medication Tips: Please keep the boxes that topical medications come in in order to help keep track of the instructions about where and how to use these. Pharmacies typically print the medication instructions only on the boxes and not directly on the medication tubes.   If your medication is too expensive, please contact our office at [phone] option 4 or send us a message through MyChart.   We are unable to tell what your co-pay for medications will be in  advance as this is different depending on your insurance coverage. However, we may be able to find a substitute medication at lower cost or fill out paperwork to get insurance to cover a needed medication.   If a prior authorization is required to get your medication covered by your insurance company, please allow us 1-2 business days to complete this process.  Drug prices often vary depending on where the prescription is filled and some pharmacies may offer cheaper prices.  The website www.goodrx.com contains coupons for medications through different pharmacies. The prices here do not account for what the cost may be with help from insurance (it may be cheaper with your insurance), but the website can give you the price if you did not use any insurance.  - You can print the associated coupon and take it with your prescription to the pharmacy.  - You may also stop by our office during regular business hours and pick up a GoodRx coupon card.  - If you need your prescription sent electronically to a different pharmacy, notify our office through [facility] MyChart or by phone at [phone] option 4.     Si Usted Necesita Algo Despus de Su Visita  Tambin puede enviarnos un mensaje a travs de MyChart. Por lo general respondemos a los mensajes de MyChart en el transcurso de 1 a 2 das hbiles.  Para renovar recetas, por favor pida a su farmacia que se ponga en contacto con nuestra oficina. Nuestro nmero de fax es el [phone].  Si tiene un asunto urgente cuando la clnica est cerrada y que no puede esperar hasta el siguiente da hbil, puede llamar/localizar a su doctor(a) al nmero que aparece a continuacin.   Por favor, tenga en cuenta que aunque hacemos todo lo posible para estar disponibles para asuntos urgentes fuera del horario de oficina, no estamos disponibles las 24 horas del da, los 7 das de la semana.   Si tiene un problema urgente y no puede comunicarse con nosotros, puede  optar por buscar atencin mdica  en el consultorio de su doctor(a), en una clnica privada, en un centro de atencin urgente o en una sala de emergencias.  Si tiene una emergencia mdica, por favor llame inmediatamente al 911 o vaya a la sala de emergencias.  Nmeros de bper  - Dr. Kowalski: [phone]  - Dra. Moye: [phone]  - Dra. Stewart: [phone]  En caso de inclemencias del tiempo, por favor llame a nuestra lnea principal al [phone] para una actualizacin sobre el estado de cualquier retraso o cierre.  Consejos para la medicacin en dermatologa: Por favor, guarde las cajas en las que vienen los medicamentos de uso tpico para ayudarle a seguir las instrucciones sobre dnde y cmo usarlos. Las farmacias generalmente imprimen las instrucciones del medicamento slo en las cajas y no directamente en los tubos del medicamento.   Si su medicamento es muy caro, por favor, pngase en contacto con   nuestra oficina llamando al [phone] y presione la opcin 4 o envenos un mensaje a travs de MyChart.   No podemos decirle cul ser su copago por los medicamentos por adelantado ya que esto es diferente dependiendo de la cobertura de su seguro. Sin embargo, es posible que podamos encontrar un medicamento sustituto a menor costo o llenar un formulario para que el seguro cubra el medicamento que se considera necesario.   Si se requiere una autorizacin previa para que su compaa de seguros cubra su medicamento, por favor permtanos de 1 a 2 das hbiles para completar este proceso.  Los precios de los medicamentos varan con frecuencia dependiendo del lugar de dnde se surte la receta y alguna farmacias pueden ofrecer precios ms baratos.  El sitio web www.goodrx.com tiene cupones para medicamentos de diferentes farmacias. Los precios aqu no tienen en cuenta lo que podra costar con la ayuda del seguro (puede ser ms barato con su seguro), pero el sitio web puede darle el  precio si no utiliz ningn seguro.  - Puede imprimir el cupn correspondiente y llevarlo con su receta a la farmacia.  - Tambin puede pasar por nuestra oficina durante el horario de atencin regular y recoger una tarjeta de cupones de GoodRx.  - Si necesita que su receta se enve electrnicamente a una farmacia diferente, informe a nuestra oficina a travs de MyChart de Sandra Bonilla o por telfono llamando al [phone] y presione la opcin 4.  

## 2022-04-21 NOTE — Progress Notes (Signed)
90 day supply sent to pharmacy per patients request.

## 2022-04-21 NOTE — Progress Notes (Signed)
Follow-Up Visit   Subjective  Sandra Bonilla is a 47 y.o. female who presents for the following: Annual Exam (Hx dysplastic nevus). The patient presents for Total-Body Skin Exam (TBSE) for skin cancer screening and mole check.  The patient has spots, moles and lesions to be evaluated, some may be new or changing and the patient has concerns that these could be cancer.  The following portions of the chart were reviewed this encounter and updated as appropriate:   Tobacco  Allergies  Meds  Problems  Med Hx  Surg Hx  Fam Hx     Review of Systems:  No other skin or systemic complaints except as noted in HPI or Assessment and Plan.  Objective  Well appearing patient in no apparent distress; mood and affect are within normal limits.  A full examination was performed including scalp, head, eyes, ears, nose, lips, neck, chest, axillae, abdomen, back, buttocks, bilateral upper extremities, bilateral lower extremities, hands, feet, fingers, toes, fingernails, and toenails. All findings within normal limits unless otherwise noted below.  R forehead x 1 Erythematous stuck-on, waxy papule or plaque  R upper back medial to middle scapula 0.4 cm irregular brown macule.    Assessment & Plan  Inflamed seborrheic keratosis R forehead x 1 Symptomatic, irritating, patient would like treated. Destruction of lesion - R forehead x 1 Complexity: simple   Destruction method: cryotherapy   Informed consent: discussed and consent obtained   Timeout:  patient name, date of birth, surgical site, and procedure verified Lesion destroyed using liquid nitrogen: Yes   Region frozen until ice ball extended beyond lesion: Yes   Outcome: patient tolerated procedure well with no complications   Post-procedure details: wound care instructions given    Neoplasm of uncertain behavior of skin R upper back medial to middle scapula  Epidermal / dermal shaving Lesion diameter (cm):  0.4 Informed consent:  discussed and consent obtained   Timeout: patient name, date of birth, surgical site, and procedure verified   Procedure prep:  Patient was prepped and draped in usual sterile fashion Prep type:  Isopropyl alcohol Anesthesia: the lesion was anesthetized in a standard fashion   Anesthetic:  1% lidocaine w/ epinephrine 1-100,000 buffered w/ 8.4% NaHCO3 Instrument used: flexible razor blade   Hemostasis achieved with: pressure, aluminum chloride and electrodesiccation   Outcome: patient tolerated procedure well   Post-procedure details: sterile dressing applied and wound care instructions given   Dressing type: bandage and petrolatum    Anatomic Pathology Report  Lentigines - Scattered tan macules - Due to sun exposure - Benign-appearing, observe - Recommend daily broad spectrum sunscreen SPF 30+ to sun-exposed areas, reapply every 2 hours as needed. - Call for any changes  Seborrheic Keratoses - Stuck-on, waxy, tan-brown papules and/or plaques  - Benign-appearing - Discussed benign etiology and prognosis. - Observe - Call for any changes  Melanocytic Nevi - Tan-brown and/or pink-flesh-colored symmetric macules and papules - Benign appearing on exam today - Observation - Call clinic for new or changing moles - Recommend daily use of broad spectrum spf 30+ sunscreen to sun-exposed areas.   Hemangiomas - Red papules - Discussed benign nature - Observe - Call for any changes  Actinic Damage - Chronic condition, secondary to cumulative UV/sun exposure - diffuse scaly erythematous macules with underlying dyspigmentation - Recommend daily broad spectrum sunscreen SPF 30+ to sun-exposed areas, reapply every 2 hours as needed.  - Staying in the shade or wearing long sleeves, sun glasses (UVA+UVB protection)  and wide brim hats (4-inch brim around the entire circumference of the hat) are also recommended for sun protection.  - Call for new or changing lesions.  Skin cancer  screening performed today.  Return in about 1 year (around 04/22/2023) for TBSE.  Luther Redo, CMA, am acting as scribe for Sarina Ser, MD . Documentation: I have reviewed the above documentation for accuracy and completeness, and I agree with the above.  Sarina Ser, MD

## 2022-04-21 NOTE — Telephone Encounter (Signed)
Ro have you gotten a request?

## 2022-04-25 LAB — ANATOMIC PATHOLOGY REPORT

## 2022-04-28 ENCOUNTER — Telehealth: Payer: Self-pay

## 2022-04-28 NOTE — Telephone Encounter (Signed)
Patient advised pathology results showed dysplastic nevus with mild atypia. Will recheck on follow up. Lurlean Horns., RMA

## 2022-04-28 NOTE — Telephone Encounter (Signed)
-----  Message from Ralene Bathe, MD sent at 04/28/2022  3:16 PM EST ----- Diagnosis synopsis: Comment Comment: Part A-Right upper back medial to middlescapula,Skin Biopsy: LENTIGINOUS COMPOUND DYSPLASTIC NEVUS WITH MILD ATYPIA, MARGINS UNINVOLVED, SEE COMMENT  Mild dysplastic Recheck next visit

## 2022-04-28 NOTE — Telephone Encounter (Signed)
Lft pt msg to call for bx result/sh

## 2022-04-30 ENCOUNTER — Encounter: Payer: Self-pay | Admitting: Dermatology

## 2022-05-19 ENCOUNTER — Encounter: Payer: Self-pay | Admitting: Internal Medicine

## 2022-05-19 ENCOUNTER — Ambulatory Visit: Payer: BC Managed Care – PPO | Admitting: Internal Medicine

## 2022-05-19 VITALS — BP 130/80 | HR 87 | Ht 66.0 in | Wt 267.2 lb

## 2022-05-19 DIAGNOSIS — F418 Other specified anxiety disorders: Secondary | ICD-10-CM | POA: Diagnosis not present

## 2022-05-19 DIAGNOSIS — G35 Multiple sclerosis: Secondary | ICD-10-CM | POA: Diagnosis not present

## 2022-05-19 DIAGNOSIS — G4733 Obstructive sleep apnea (adult) (pediatric): Secondary | ICD-10-CM | POA: Diagnosis not present

## 2022-05-19 DIAGNOSIS — I10 Essential (primary) hypertension: Secondary | ICD-10-CM | POA: Diagnosis not present

## 2022-05-19 NOTE — Progress Notes (Signed)
Established Patient Office Visit  Subjective:  Patient ID: Sandra Bonilla, female    DOB: 05-08-1975  Age: 47 y.o. MRN: [id number]  Chief Complaint  Patient presents with   Follow-up    1 mo follow up    No new complaints, here for lab review and medication refills. Noncompliant with Wegovy due to episode of nausea and vomiting. Continues to gain weight and yet to receive her CPAP machine.    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   Head injury 06/25/2016   History of mammogram 11/2014   FIBROADENOMA   History of Papanicolaou smear of cervix 01/24/13; 10/15/15   -/-; -/+   Hypertension    Menorrhagia 02/01/2010   D/C HYSTEROSCOPY ENDO POLYP   MS (multiple sclerosis) ([place])    Ovarian cyst 9/15; 2015-2016   LEFT - WENT TO [facility]; RIGHT   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    Comment: Mirena  Other Topics Concern   Not on file  Social History Narrative   Not on file   Social  Determinants of Health   Financial Resource Strain: Not on file  Food Insecurity: Not on file  Transportation Needs: Not on file  Physical Activity: Not on file  Stress: Not on file  Social Connections: Not on file  Intimate Partner Violence: Not on file    Family History  Problem Relation Age of Onset   Breast cancer Maternal Aunt 46       has contact   Rheum arthritis Mother    Hypertension Mother    Thyroid disease Mother        HYPOTHYROIDISM   Lung cancer Mother 72   Heart disease Father    Diabetes Father    Hypertension Father    Cancer Paternal Grandmother        MELANOMA OF SKIN   Cancer Cousin        KIDNEY-COUSIN   Colon cancer Neg Hx    Stomach cancer Neg Hx    Pancreatic cancer Neg Hx    Esophageal cancer Neg Hx    Inflammatory bowel disease Neg Hx    Rectal cancer Neg Hx     Allergies  Allergen Reactions   Sulfamethoxazole-Trimethoprim Rash and Hives   Sulfa Antibiotics  Other (See Comments) and Rash    Review of Systems  Constitutional: Negative.   HENT: Negative.    Eyes: Negative.   Respiratory: Negative.    Cardiovascular: Negative.   Gastrointestinal: Negative.   Genitourinary: Negative.   Skin: Negative.   Neurological: Negative.   Endo/Heme/Allergies: Negative.        Objective:   BP 130/80   Pulse 87   Ht 5' 6"$  (1.676 m)   Wt 267 lb 3.2 oz (121.2 kg)   SpO2 99%   BMI 43.13 kg/m   Vitals:   05/19/22 1046  BP: 130/80  Pulse: 87  Height: 5' 6"$  (1.676 m)  Weight: 267 lb 3.2 oz (121.2 kg)  SpO2: 99%  BMI (Calculated): 43.15    Physical Exam Vitals reviewed.  Constitutional:      General: She is not in acute distress.    Appearance: She is obese.  HENT:     Head: Normocephalic.     Nose: Nose normal.     Mouth/Throat:     Mouth: Mucous membranes are moist.  Eyes:     Extraocular Movements: Extraocular movements intact.     Pupils: Pupils are equal, round, and reactive to light.  Cardiovascular:     Rate and  Rhythm: Normal rate and regular rhythm.     Heart sounds: No murmur heard. Pulmonary:     Effort: Pulmonary effort is normal.     Breath sounds: No rhonchi or rales.  Abdominal:     General: Abdomen is flat.     Palpations: There is no hepatomegaly, splenomegaly or mass.  Musculoskeletal:        General: Normal range of motion.     Cervical back: Normal range of motion. No tenderness.  Skin:    General: Skin is warm and dry.  Neurological:     General: No focal deficit present.     Mental Status: She is alert and oriented to person, place, and time.     Cranial Nerves: No cranial nerve deficit.     Motor: No weakness.  Psychiatric:        Mood and Affect: Mood normal.        Behavior: Behavior normal.      No results found for any visits on 05/19/22.  Recent Results (from the past 2160 hour(Coda Mathey))  Anatomic Pathology Report     Status: None   Collection Time: 04/21/22  4:45 PM  Result Value Ref Range   Diagnosis synopsis: Comment     Comment: Part A-Right upper back medial to middlescapula,Skin Biopsy: LENTIGINOUS COMPOUND DYSPLASTIC NEVUS WITH MILD ATYPIA, MARGINS UNINVOLVED, SEE COMMENT    Specimen: Comment     Comment: Part A-Right upper back medial to middlescapula,Skin Biopsy   Diagnosis: Comment     Comment: Part A-LENTIGINOUS COMPOUND DYSPLASTIC NEVUS WITH MILD ATYPIA, MARGINS UNINVOLVED, SEE COMMENT    Comment: Comment     Comment: Part A-Representative sections taken through the central portion of the bisected specimen demonstrate clear surgical margins.    Gross description: Comment     Comment: A-The specimen is received in formalin labeled "[place], Ronika, RIght upper back medial to middle scapula". Received is a light tan shave biopsy measuring 0.6 x 0.5 x 0.1 cm in greatest dimensions. There is a flat, soft, and tan-brown lesion measuring 0.3 x 0.2 cm. The specimen is bisected and entirely submitted in cassette 1.    Electronically signed by:  Comment     Comment: Lizabeth Leyden, MD,  PhD, Dermatopathologist   CPT code(Marcelus Dubberly): Comment     Comment: [id number]   CPT Disclaimer: Comment     Comment: CPT codes, as published by the AMA, are provided for informational purpose only as the assignment of the CPT codes is the responsibility of the billing party.    Clinician provided ICD: Comment     Comment: D48.5   Pathologist provided ICD: Comment     Comment: D48.5      Assessment & Plan:   Problem List Items Addressed This Visit   None Resume Wegovy and follow up for weight management.  No follow-ups on file.   Total time spent: 20 minutes  Volanda Napoleon, MD  05/19/2022

## 2022-06-19 ENCOUNTER — Encounter: Payer: Self-pay | Admitting: *Deleted

## 2022-06-19 DIAGNOSIS — G35 Multiple sclerosis: Secondary | ICD-10-CM

## 2022-06-19 MED ORDER — TERIFLUNOMIDE 14 MG PO TABS: 1.0000 | ORAL_TABLET | Freq: Every day | ORAL | 1 refills | Status: DC

## 2022-06-23 ENCOUNTER — Other Ambulatory Visit: Payer: Self-pay | Admitting: Internal Medicine

## 2022-07-07 ENCOUNTER — Other Ambulatory Visit: Payer: Self-pay | Admitting: *Deleted

## 2022-07-07 MED ORDER — MIRABEGRON ER 25 MG PO TB24: 25.0000 mg | ORAL_TABLET | Freq: Every day | ORAL | 1 refills | Status: DC

## 2022-07-09 ENCOUNTER — Encounter: Payer: Self-pay | Admitting: Gastroenterology

## 2022-07-15 ENCOUNTER — Encounter: Payer: Self-pay | Admitting: Gastroenterology

## 2022-07-18 ENCOUNTER — Ambulatory Visit: Payer: BC Managed Care – PPO | Admitting: Internal Medicine

## 2022-07-24 ENCOUNTER — Ambulatory Visit (AMBULATORY_SURGERY_CENTER): Payer: BC Managed Care – PPO | Admitting: *Deleted

## 2022-07-24 VITALS — Ht 66.0 in | Wt 260.0 lb

## 2022-07-24 DIAGNOSIS — Z8719 Personal history of other diseases of the digestive system: Secondary | ICD-10-CM

## 2022-07-24 NOTE — Progress Notes (Signed)
No egg or soy allergy known to patient  No issues known to pt with past sedation with any surgeries or procedures Patient denies ever being told they had issues or difficulty with intubation  No FH of Malignant Hyperthermia Pt is not on diet pills Pt is not on  home 02  Pt is not on blood thinners  Pt denies issues with constipation  No A fib or A flutter Have any cardiac testing pending--NO Pt instructed to use Singlecare.com or GoodRx for a price reduction on prep   MOBILITY INDEPENDENTLY  Patient's chart reviewed by Cathlyn Parsons CNRA prior to previsit and patient appropriate for the [facility].  Previsit completed and red dot placed by patient's name on their procedure day (on provider's schedule).

## 2022-07-25 ENCOUNTER — Encounter: Payer: Self-pay | Admitting: Gastroenterology

## 2022-07-29 ENCOUNTER — Ambulatory Visit: Payer: BC Managed Care – PPO | Admitting: Family Medicine

## 2022-07-29 ENCOUNTER — Encounter: Payer: Self-pay | Admitting: Family Medicine

## 2022-07-29 VITALS — BP 147/81 | HR 88 | Ht 66.0 in | Wt 272.6 lb

## 2022-07-29 DIAGNOSIS — G35 Multiple sclerosis: Secondary | ICD-10-CM

## 2022-07-29 DIAGNOSIS — F418 Other specified anxiety disorders: Secondary | ICD-10-CM

## 2022-07-29 DIAGNOSIS — R269 Unspecified abnormalities of gait and mobility: Secondary | ICD-10-CM

## 2022-07-29 DIAGNOSIS — Z79899 Other long term (current) drug therapy: Secondary | ICD-10-CM

## 2022-07-29 DIAGNOSIS — G4719 Other hypersomnia: Secondary | ICD-10-CM

## 2022-07-29 DIAGNOSIS — R208 Other disturbances of skin sensation: Secondary | ICD-10-CM

## 2022-07-29 DIAGNOSIS — R350 Frequency of micturition: Secondary | ICD-10-CM

## 2022-07-29 DIAGNOSIS — E559 Vitamin D deficiency, unspecified: Secondary | ICD-10-CM

## 2022-07-29 NOTE — Progress Notes (Signed)
Chief Complaint  Patient presents with   Follow-up    RM 2, alone. Last seen 01/21/22. MS DMT: teriflunomide. In the last couple months, she has had more brain fog overall. She is supervisor in office setting. Hard to process things being told to her sometimes. Received CPAP in last couple months. Has mask that goes under nose and covers mouth.  PCP set her up on CPAP. She has initial CPAP f/u with PCP next Friday. Feel she is suffocating sometimes while wearing it. She will address with PCP at upcoming appt.    HISTORY OF PRESENT ILLNESS:  07/29/22 ALL: Sandra Bonilla is a 47 y.o. female here today for follow up for RRMS. She continues Aubagio and tolerating well. Labs have been stable. Last MRI brain stable in 02/2020.  She feels that she is doing fairly well. She denies new or exacerbating symptoms. She feels gait is stable. Some days she feels a little off balance. No falls. She does not use an assistive device.   Gabapentin helps radicular pain in legs. She is taking gabapentin up to 300/300/600. Usually she can take 300mg  in the am and 300mg  in the pm. She has not needed meloxicam or cyclobenzaprine recently. She only takes these meds when pain is really bad. She does feel that lidocaine patches help. She does take Celebrex daily prescribed by PCP for back pain.   Mood is good. Ativan and buspirone help with anxiety prescribed by PCP. She feels anxiety may be a little worse than it has been. She described more brain fog. She is a Producer, television/film/video for [employer]. Recent HST showed mild OSA. Weight management advised. PCP started her on CPAP therapy. She has initial compliance follow up next week. . Melatonin was helpful in the past but she hasn't taken it recently. She has tried sleep aids in the past but they make her too groggy.   She continues Mybetriq 25mg  QD for urinary frequency. It seems to provide better control than oxybutynin. She does have some urge incontinence.   She  continues vitamin D supplements OTC. She thinks the is taking 1000iu daily.   HISTORY (copied from Dr Bonnita Hollow previous note)  Sandra Bonilla is a 46 y.o. 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      Update 10/24/023 She feels her MS is mostly stable and she denies any exacerbation.   MRI 02/2020 showed no new lesions.     She is on Aubagio and tolerates it well.   Gait is ok.  Sometimes she feels she veers to the right.   Rare stumbles but no falls.  Mild intermittent right hand numbness.  Legs ok.   She has urinary frequency and is on Myrbetriq. The oxybutynin had stopped helping after a while ad she stopped.   No UTI's.    Vision is fine.          She has fatigue and notes insomnia.  She snores and occasionally wakes up with a snort.   She has gained some weight.      She denies depression but has anxiety.  She is on Buspar and lorazepam for the anxiety.  Mild cognitive fog at times.   Mild reduced focus/attention   She had bowel surgery and required a short term ed colostomy (she had diverticulosis).      EPWORTH SLEEPINESS SCALE   On a scale of 0 - 3 what is the chance of dozing:   Sitting and Reading:  1 Watching TV:                                      3 Sitting inactive in a public place:        0 Passenger in car for one hour:           3 Lying down to rest in the afternoon:   3 Sitting and talking to someone:          0 Sitting quietly after lunch:                   0 In a car, stopped in traffic:                  0   Total (out of 24):   10/24   mild ESS     MS HIstory:       About  2006, she noted gait changes and her right foot was clumsy.    She had an MRI consistent with MS and was referred to Dr. Orlin Hilding [facility]) who diagnosed her with MS.   She then saw Dr. Leone Brand and then Dr. Alberteen Spindle at [facility].  She was reluctant to take a medication until  2012 years ago when she started Copaxone.   She had skin reactions, she stopped after one year and  switched to Aubagio (+/- late 2013).   She has tolerated it well.      Imaging MRI brain 03/06/2020:  Multiple T2/FLAIR hyperintense foci in the periventricular, juxtacortical and deep white matter of both hemispheres.  Additional foci are noted in the spinal cord adjacent to C2, right cerebral peduncle and right cerebellar hemisphere.  None of the foci appear to be acute and they do not enhance.  Compared to the MRI dated 01/20/2018, there are no new lesions.  Small developmental venous anomaly in the right medial parietal lobe   REVIEW OF SYSTEMS: Out of a complete 14 system review of symptoms, the patient complains only of the following symptoms, lower extremity swelling , dysesthesias, anxiety, fatigue, urinary frequency, urge incontinence, and all other reviewed systems are negative.   ALLERGIES: Allergies  Allergen Reactions   Sulfamethoxazole-Trimethoprim Rash and Hives   Sulfa Antibiotics Other (See Comments) and Rash     HOME MEDICATIONS: Outpatient Medications Prior to Visit  Medication Sig Dispense Refill   acetaminophen (TYLENOL) 325 MG tablet Take 2 tablets (650 mg total) by mouth every 6 (six) hours as needed for mild pain (or Fever >/= 101).     busPIRone (BUSPAR) 15 MG tablet Take 15 mg by mouth 2 (two) times daily.     celecoxib (CELEBREX) 200 MG capsule SMARTSIG:2 Capsule(s) By Mouth Daily PRN     chlorthalidone (HYGROTON) 25 MG tablet TAKE ONE-HALF TABLET BY MOUTH  DAILY AS DIRECTED 45 tablet 1   cholecalciferol (VITAMIN D3) 25 MCG (1000 UT) tablet Take 1,000 Units by mouth daily.     cyclobenzaprine (FLEXERIL) 5 MG tablet Take 1 tablet (5 mg total) by mouth at bedtime as needed. (Patient taking differently: Take 5 mg by mouth at bedtime as needed for muscle spasms.) 30 tablet 11   esomeprazole (NEXIUM) 40 MG capsule Take 1 capsule (40 mg total) by mouth 2 (two) times daily. After 2 months, please take only once daily. 90 capsule 3   gabapentin (NEURONTIN) 300 MG capsule  One po qAM, one po qPM  and two po qHS 360 capsule 4   levonorgestrel (MIRENA) 20 MCG/DAY IUD 1 each by Intrauterine route once.     lidocaine (LIDODERM) 5 % Place 1 patch onto the skin daily. Remove & Discard patch within 12 hours or as directed by MD (Patient taking differently: Place 1 patch onto the skin as needed. Remove & Discard patch within 12 hours or as directed by MD) 90 patch 0   LORazepam (ATIVAN) 1 MG tablet TAKE 1 TABLET BY MOUTH DAILY AS  NEEDED FOR ANXIETY 90 tablet 0   meloxicam (MOBIC) 15 MG tablet Take 1 tablet (15 mg total) by mouth daily. (Patient taking differently: Take 15 mg by mouth daily as needed for pain.) 30 tablet 5   mirabegron ER (MYRBETRIQ) 25 MG TB24 tablet Take 1 tablet (25 mg total) by mouth daily. 90 tablet 1   mometasone (ELOCON) 0.1 % cream Apply 1 application topically daily as needed (Rash). Up to 5 times per week 45 g 1   ondansetron (ZOFRAN-ODT) 4 MG disintegrating tablet Take 4 mg by mouth every 6 (six) hours as needed for nausea or vomiting.      Polyvinyl Alcohol-Povidone (REFRESH OP) Place 2 drops into both eyes 4 (four) times daily as needed (dryness).     Probiotic Product (PROBIOTIC PO) Take 1 capsule by mouth daily.     Teriflunomide 14 MG TABS Take 1 tablet by mouth daily. 90 tablet 4   Teriflunomide 14 MG TABS Take 1 tablet (14 mg total) by mouth daily. 90 tablet 1   TRELEGY ELLIPTA 200-62.5-25 MCG/ACT AEPB Inhale 1 puff into the lungs daily.     No facility-administered medications prior to visit.     PAST MEDICAL HISTORY: Past Medical History:  Diagnosis Date   Anemia 05/2019   iron infusions   Anxiety    Asthma    Cervical high risk human papillomavirus (HPV) DNA test positive 09/2015   Cholecystitis 2006   GALBLADDER REMOVED   Depression    Diverticulitis    Diverticulosis    Dysplastic nevus 08/24/2008   right upper back lat, right upper back medial   Dysplastic nevus 04/21/2022   upper back medial to middlescapula, mild  atypia   Fibroadenoma 2016   RIGHT BREAST   GERD (gastroesophageal reflux disease)    Head injury 06/25/2016   History of mammogram 11/2014   FIBROADENOMA   History of Papanicolaou smear of cervix 01/24/13; 10/15/15   -/-; -/+   Hypertension    Menorrhagia 02/01/2010   D/C HYSTEROSCOPY ENDO POLYP   MS (multiple sclerosis) (HCC)    Neuromuscular disorder (HCC)    Ovarian cyst 9/15; 2015-2016   LEFT - WENT TO [place]; RIGHT   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   Sleep apnea    WEAR C PAP     PAST SURGICAL HISTORY: Past Surgical History:  Procedure Laterality Date   BIOPSY  03/21/2019   Procedure: BIOPSY;  Surgeon: Lemar Lofty., MD;  Location: [facility] ENDOSCOPY;  Service: Gastroenterology;;   BREAST BIOPSY Right 2016   Fibroadenoma   CHOLECYSTECTOMY  2006   COLON RESECTION N/A 08/11/2019   Procedure: DIAGNOSTIC LAPAROSCOPY WITH WASH OUT, DRAIN PLACEMENT, AND LOOP ILEOSTOMY CREATION;  Surgeon: Romie Levee, MD;  Location: [facility] ORS;  Service: General;  Laterality: N/A;   COLONOSCOPY WITH PROPOFOL N/A 03/21/2019   Procedure: COLONOSCOPY WITH PROPOFOL;  Surgeon: Lemar Lofty., MD;  Location: [facility] ENDOSCOPY;  Service: Gastroenterology;  Laterality: N/A;  ultraslim colonoscope  CYSTOSCOPY WITH INJECTION N/A 08/04/2019   Procedure: FIREFLY INJECTION;  Surgeon: Crist Fat, MD;  Location: [facility] ORS;  Service: Urology;  Laterality: N/A;   DILATION AND CURETTAGE, DIAGNOSTIC / THERAPEUTIC  02/01/2010   D/C HYSTEROSCOPY ENDO POLYP; [facility]   ENDOSCOPIC MUCOSAL RESECTION  03/21/2019   Procedure: ENDOSCOPIC MUCOSAL RESECTION;  Surgeon: Meridee Score Netty Starring., MD;  Location: [facility] ENDOSCOPY;  Service: Gastroenterology;;   ESOPHAGOGASTRODUODENOSCOPY (EGD) WITH PROPOFOL N/A 03/21/2019   Procedure: ESOPHAGOGASTRODUODENOSCOPY (EGD) WITH PROPOFOL;  Surgeon: Lemar Lofty., MD;  Location: [facility] ENDOSCOPY;  Service: Gastroenterology;  Laterality: N/A;   EYE SURGERY      Lasik   FLEXIBLE SIGMOIDOSCOPY  07/31/2016   Procedure: FLEXIBLE SIGMOIDOSCOPY;  Surgeon: Romie Levee, MD;  Location: [facility] ENDOSCOPY;  Service: Endoscopy;;   HEMOSTASIS CLIP PLACEMENT  03/21/2019   Procedure: HEMOSTASIS CLIP PLACEMENT;  Surgeon: Lemar Lofty., MD;  Location: [facility] ENDOSCOPY;  Service: Gastroenterology;;   HYSTEROSCOPY  02/01/2010   ENDO POLYP - [facility]   IR RADIOLOGIST EVAL & MGMT  11/16/2018   IR RADIOLOGIST EVAL & MGMT  09/06/2019   SCLEROTHERAPY  03/21/2019   Procedure: Susa Day;  Surgeon: Mansouraty, Netty Starring., MD;  Location: [facility] ENDOSCOPY;  Service: Gastroenterology;;     FAMILY HISTORY: Family History  Problem Relation Age of Onset   Rheum arthritis Mother    Hypertension Mother    Thyroid disease Mother        HYPOTHYROIDISM   Lung cancer Mother 49   Heart disease Father    Diabetes Father    Hypertension Father    Breast cancer Maternal Aunt 76       has contact   Cancer Paternal Grandmother        MELANOMA OF SKIN   Cancer Cousin        KIDNEY-COUSIN   Colon cancer Neg Hx    Stomach cancer Neg Hx    Pancreatic cancer Neg Hx    Esophageal cancer Neg Hx    Inflammatory bowel disease Neg Hx    Rectal cancer Neg Hx    Colon polyps Neg Hx    Crohn's disease Neg Hx    Ulcerative colitis Neg Hx      SOCIAL HISTORY: Social History   Socioeconomic History   Marital status: Divorced    Spouse name: Not on file   Number of children: 0   Years of education: 14   Highest education level: Not on file  Occupational History    Employer: [employer]  Tobacco Use   Smoking status: Former    Years: 10    Types: Cigarettes    Quit date: 2010    Years since quitting: 14.3   Smokeless tobacco: Never  Vaping Use   Vaping Use: Never used  Substance and Sexual Activity   Alcohol use: No   Drug use: No   Sexual activity: Yes    Partners: Male    Birth control/protection: I.U.D.    Comment: Mirena  Other Topics Concern   Not on file  Social  History Narrative   Not on file   Social Determinants of Health   Financial Resource Strain: Not on file  Food Insecurity: Not on file  Transportation Needs: Not on file  Physical Activity: Not on file  Stress: Not on file  Social Connections: Not on file  Intimate Partner Violence: Not on file      PHYSICAL EXAM  Vitals:   07/29/22 1420  BP: (!) 147/81  Pulse: 88  Weight: 272 lb 9.6 oz (123.7 kg)  Height: 5\' 6"  (1.676 m)     Body mass index is 44 kg/m.   Generalized: Well developed, in no acute distress  Cardiology: normal rate and rhythm, no murmur auscultated  Respiratory: clear to auscultation bilaterally    Neurological examination  Mentation: Alert oriented to time, place, history taking. Follows all commands speech and language fluent Cranial nerve II-XII: Pupils were equal round reactive to light. Extraocular movements were full, visual field were full on confrontational test. Facial sensation and strength were normal. Uvula tongue midline. Head turning and shoulder shrug  were normal and symmetric. Motor: The motor testing reveals 5 over 5 strength of all 4 extremities. Good symmetric motor tone is noted throughout.  Sensory: Sensory testing is intact to soft touch on all 4 extremities. No evidence of extinction is noted.  Coordination: Cerebellar testing reveals good finger-nose-finger and heel-to-shin bilaterally.  Gait and station: Gait is normal.  Reflexes: Deep tendon reflexes are symmetric and normal bilaterally.     DIAGNOSTIC DATA (LABS, IMAGING, TESTING) - I reviewed patient records, labs, notes, testing and imaging myself where available.  Lab Results  Component Value Date   WBC 13.0 (H) 01/21/2022   HGB 13.2 01/21/2022   HCT 40.9 01/21/2022   MCV 84 01/21/2022   PLT 311 01/21/2022      Component Value Date/Time   NA 139 01/21/2022 1639   K 4.1 01/21/2022 1639   CL 101 01/21/2022 1639   CO2 25 01/21/2022 1639   GLUCOSE 93 01/21/2022  1639   GLUCOSE 110 (H) 12/06/2019 1653   BUN 13 01/21/2022 1639   CREATININE 1.13 (H) 01/21/2022 1639   CALCIUM 9.2 01/21/2022 1639   PROT 7.0 01/21/2022 1639   ALBUMIN 4.3 01/21/2022 1639   AST 18 01/21/2022 1639   ALT 15 01/21/2022 1639   ALKPHOS 128 (H) 01/21/2022 1639   BILITOT 0.8 01/21/2022 1639   GFRNONAA 105 02/29/2020 0810   GFRAA 121 02/29/2020 0810   Lab Results  Component Value Date   TRIG 120 08/22/2019   No results found for: "HGBA1C" Lab Results  Component Value Date   VITAMINB12 235 03/11/2019   Lab Results  Component Value Date   TSH 0.87 12/23/2018      ASSESSMENT AND PLAN  47 y.o. year old female  has a past medical history of Anemia (05/2019), Anxiety, Asthma, Cervical high risk human papillomavirus (HPV) DNA test positive (09/2015), Cholecystitis (2006), Depression, Diverticulitis, Diverticulosis, Dysplastic nevus (08/24/2008), Dysplastic nevus (04/21/2022), Fibroadenoma (2016), GERD (gastroesophageal reflux disease), Head injury (06/25/2016), History of mammogram (11/2014), History of Papanicolaou smear of cervix (01/24/13; 10/15/15), Hypertension, Menorrhagia (02/01/2010), MS (multiple sclerosis) (HCC), Neuromuscular disorder (HCC), Ovarian cyst (9/15; 2015-2016), Pericolonic abscess due to diverticulitis (2020), and Sleep apnea. here with   Relapsing remitting multiple sclerosis (HCC) - Plan: MR BRAIN W WO CONTRAST, CBC with Differential/Platelets, Hepatic Function Panel  High risk medication use  Gait disturbance  Excessive daytime sleepiness  Dysesthesia  Urinary frequency  Depression with anxiety  Vitamin D deficiency - Plan: Vitamin D, 25-hydroxy  Sandra Bonilla is doing well from an MS perspective.  We will continue Aubagio as prescribed. MRI stable 02/2020. I will place orders to repeat for surveillance. We will update labs, today. She will continue gabapentin up to 300mg  BID and 600mg  at bedtime.  May use cyclobenzaprine and meloxicam as  needed. Advised not to use meloxicam and Celebrex together. We will continue Myrbetriq 25mg  QD. She will continue CPAP  therapy per PCP. Consider adding exercise regimen. Healthy lifestyle habits advised.  She will follow-up with Dr. Epimenio Foot in 6 months, sooner if needed.  She verbalizes understanding and agreement with this plan.   Shawnie Dapper, MSN, FNP-C 07/29/2022, 2:58 PM  [facility] [address] [place], [place] [address] [phone]

## 2022-07-29 NOTE — Patient Instructions (Addendum)
Below is our plan:  We will continue current treatment plan. We will update labs today. We will order MRI. Listen out for a call from our office to schedule.   Please make sure you are staying well hydrated. I recommend 50-60 ounces daily. Well balanced diet and regular exercise encouraged. Consistent sleep schedule with 6-8 hours recommended.   Please continue follow up with care team as directed.   Follow up with Dr Epimenio Foot in 6 months   You may receive a survey regarding today's visit. I encourage you to leave honest feed back as I do use this information to improve patient care. Thank you for seeing me today!

## 2022-07-30 ENCOUNTER — Telehealth: Payer: Self-pay | Admitting: Family Medicine

## 2022-07-30 LAB — VITAMIN D 25 HYDROXY (VIT D DEFICIENCY, FRACTURES): Vit D, 25-Hydroxy: 68.6 ng/mL (ref 30.0–100.0)

## 2022-07-30 LAB — CBC WITH DIFFERENTIAL/PLATELET
Basophils Absolute: 0.1 10*3/uL (ref 0.0–0.2)
Basos: 1 %
EOS (ABSOLUTE): 0.2 10*3/uL (ref 0.0–0.4)
Eos: 2 %
Hematocrit: 42 % (ref 34.0–46.6)
Hemoglobin: 13.8 g/dL (ref 11.1–15.9)
Immature Grans (Abs): 0 10*3/uL (ref 0.0–0.1)
Immature Granulocytes: 0 %
Lymphocytes Absolute: 2.4 10*3/uL (ref 0.7–3.1)
Lymphs: 28 %
MCH: 26.8 pg (ref 26.6–33.0)
MCHC: 32.9 g/dL (ref 31.5–35.7)
MCV: 82 fL (ref 79–97)
Monocytes Absolute: 1 10*3/uL — ABNORMAL HIGH (ref 0.1–0.9)
Monocytes: 11 %
Neutrophils Absolute: 5 10*3/uL (ref 1.4–7.0)
Neutrophils: 58 %
Platelets: 333 10*3/uL (ref 150–450)
RBC: 5.14 x10E6/uL (ref 3.77–5.28)
RDW: 14.2 % (ref 11.7–15.4)
WBC: 8.7 10*3/uL (ref 3.4–10.8)

## 2022-07-30 LAB — HEPATIC FUNCTION PANEL
ALT: 26 IU/L (ref 0–32)
AST: 29 IU/L (ref 0–40)
Albumin: 4.2 g/dL (ref 3.9–4.9)
Alkaline Phosphatase: 148 IU/L — ABNORMAL HIGH (ref 44–121)
Bilirubin Total: 0.8 mg/dL (ref 0.0–1.2)
Bilirubin, Direct: 0.36 mg/dL (ref 0.00–0.40)
Total Protein: 6.6 g/dL (ref 6.0–8.5)

## 2022-07-30 NOTE — Telephone Encounter (Signed)
[employer] NPR ref: [id number] sent to [facility] [phone]

## 2022-08-05 ENCOUNTER — Encounter: Payer: Self-pay | Admitting: Gastroenterology

## 2022-08-05 ENCOUNTER — Ambulatory Visit (AMBULATORY_SURGERY_CENTER): Payer: BC Managed Care – PPO | Admitting: Gastroenterology

## 2022-08-05 VITALS — BP 120/73 | HR 80 | Temp 97.8°F | Resp 16 | Ht 66.0 in | Wt 260.0 lb

## 2022-08-05 DIAGNOSIS — K319 Disease of stomach and duodenum, unspecified: Secondary | ICD-10-CM | POA: Diagnosis not present

## 2022-08-05 DIAGNOSIS — Z8719 Personal history of other diseases of the digestive system: Secondary | ICD-10-CM

## 2022-08-05 DIAGNOSIS — K2970 Gastritis, unspecified, without bleeding: Secondary | ICD-10-CM | POA: Diagnosis not present

## 2022-08-05 DIAGNOSIS — R140 Abdominal distension (gaseous): Secondary | ICD-10-CM

## 2022-08-05 MED ORDER — SODIUM CHLORIDE 0.9 % IV SOLN
500.0000 mL | Freq: Once | INTRAVENOUS | Status: DC
Start: 2022-08-05 — End: 2022-08-05

## 2022-08-05 MED ORDER — ESOMEPRAZOLE MAGNESIUM 40 MG PO CPDR
40.0000 mg | DELAYED_RELEASE_CAPSULE | Freq: Two times a day (BID) | ORAL | 4 refills | Status: DC
Start: 2022-08-05 — End: 2023-08-17

## 2022-08-05 NOTE — Progress Notes (Signed)
Pt's states no medical or surgical changes since previsit or office visit. 

## 2022-08-05 NOTE — Op Note (Signed)
[facility] Patient Name: Sandra Bonilla Procedure Date: 08/05/2022 10:04 AM MRN: [id number] Endoscopist: Corliss Parish , MD, [id number] Age: 47 Referring MD:  Date of Birth: [birth year] Gender: Female Account #: [id number] Procedure:                Upper GI endoscopy Indications:              Follow-up of esophagitis, Follow-up of gastritis Medicines:                Monitored Anesthesia Care Procedure:                Pre-Anesthesia Assessment:                           - Prior to the procedure, a History and Physical                            was performed, and patient medications and                            allergies were reviewed. The patient's tolerance of                            previous anesthesia was also reviewed. The risks                            and benefits of the procedure and the sedation                            options and risks were discussed with the patient.                            All questions were answered, and informed consent                            was obtained. Prior Anticoagulants: The patient has                            taken no anticoagulant or antiplatelet agents. ASA                            Grade Assessment: II - A patient with mild systemic                            disease. After reviewing the risks and benefits,                            the patient was deemed in satisfactory condition to                            undergo the procedure.                           After obtaining informed consent, the endoscope was  passed under direct vision. Throughout the                            procedure, the patient's blood pressure, pulse, and                            oxygen saturations were monitored continuously. The                            GIF [id number] #[id number] was introduced through the                            mouth, and advanced to the second part of duodenum.                             The upper GI endoscopy was accomplished without                            difficulty. The patient tolerated the procedure. Scope In: Scope Out: Findings:                 No gross lesions were noted in the entire                            esophagus. The previous esophagitis has healed.                           The Z-line was irregular and was found 36 cm from                            the incisors.                           A 1 cm hiatal hernia was present.                           Multiple dispersed small erosions with no bleeding                            and no stigmata of recent bleeding were found in                            the gastric antrum.                           No other gross lesions were noted in the entire                            examined stomach. Biopsies were taken with a cold                            forceps for histology and Helicobacter pylori  testing.                           No gross lesions were noted in the duodenal bulb,                            in the first portion of the duodenum and in the                            second portion of the duodenum. Complications:            No immediate complications. Estimated Blood Loss:     Estimated blood loss was minimal. Impression:               - No gross lesions in the entire esophagus                            (previous esophagitis is healed). Z-line irregular,                            36 cm from the incisors.                           - 1 cm hiatal hernia.                           - Erosive gastropathy within the antrum, with no                            bleeding and no stigmata of recent bleeding.                           - No other gross lesions in the entire stomach.                            Biopsied.                           - No gross lesions in the duodenal bulb, in the                            first portion of the duodenum and in the second                             portion of the duodenum. Recommendation:           - The patient will be observed post-procedure,                            until all discharge criteria are met.                           - Discharge patient to home.                           - Patient has a contact number  available for                            emergencies. The signs and symptoms of potential                            delayed complications were discussed with the                            patient. Return to normal activities tomorrow.                            Written discharge instructions were provided to the                            patient.                           - Resume previous diet.                           - Await pathology results.                           - Though there remains evidence of peptic ulcer                            disease, the patient states that she is doing and                            feeling well. We can see what the biopsies show and                            consider repeat up titration of PPI to twice daily                            otherwise if she is doing well maintain at current                            once daily dosing.                           - Continue present medications otherwise.                           - Minimize NSAIDs as able.                           - The findings and recommendations were discussed                            with the patient.                           - The findings and recommendations were discussed  with the designated responsible adult. Corliss Parish, MD 08/05/2022 10:22:15 AM

## 2022-08-05 NOTE — Patient Instructions (Signed)
Resume previous diet and medications. Awaiting pathology results. Handouts provided on Gastritis. Follow up as needed.  YOU HAD AN ENDOSCOPIC PROCEDURE TODAY AT THE [facility]:   Refer to the procedure report that was given to you for any specific questions about what was found during the examination.  If the procedure report does not answer your questions, please call your gastroenterologist to clarify.  If you requested that your care partner not be given the details of your procedure findings, then the procedure report has been included in a sealed envelope for you to review at your convenience later.  YOU SHOULD EXPECT: Some feelings of bloating in the abdomen. Passage of more gas than usual.  Walking can help get rid of the air that was put into your GI tract during the procedure and reduce the bloating. If you had a lower endoscopy (such as a colonoscopy or flexible sigmoidoscopy) you may notice spotting of blood in your stool or on the toilet paper. If you underwent a bowel prep for your procedure, you may not have a normal bowel movement for a few days.  Please Note:  You might notice some irritation and congestion in your nose or some drainage.  This is from the oxygen used during your procedure.  There is no need for concern and it should clear up in a day or so.  SYMPTOMS TO REPORT IMMEDIATELY:  Following upper endoscopy (EGD)  Vomiting of blood or coffee ground material  New chest pain or pain under the shoulder blades  Painful or persistently difficult swallowing  New shortness of breath  Fever of 100F or higher  Black, tarry-looking stools  For urgent or emergent issues, a gastroenterologist can be reached at any hour by calling [phone]. Do not use MyChart messaging for urgent concerns.    DIET:  We do recommend a small meal at first, but then you may proceed to your regular diet.  Drink plenty of fluids but you should avoid alcoholic beverages for 24  hours.  ACTIVITY:  You should plan to take it easy for the rest of today and you should NOT DRIVE or use heavy machinery until tomorrow (because of the sedation medicines used during the test).    FOLLOW UP: Our staff will call the number listed on your records the next business day following your procedure.  We will call around 7:15- 8:00 am to check on you and address any questions or concerns that you may have regarding the information given to you following your procedure. If we do not reach you, we will leave a message.     If any biopsies were taken you will be contacted by phone or by letter within the next 1-3 weeks.  Please call us at [phone] if you have not heard about the biopsies in 3 weeks.    SIGNATURES/CONFIDENTIALITY: You and/or your care partner have signed paperwork which will be entered into your electronic medical record.  These signatures attest to the fact that that the information above on your After Visit Summary has been reviewed and is understood.  Full responsibility of the confidentiality of this discharge information lies with you and/or your care-partner.

## 2022-08-05 NOTE — Progress Notes (Signed)
Vssnnad trans to pacu 

## 2022-08-05 NOTE — Progress Notes (Signed)
GASTROENTEROLOGY PROCEDURE H&P NOTE   Primary Care Physician: Sherron Monday, MD  HPI: Sandra Bonilla is a 47 y.o. female who presents for EGD for follow up esophagitis.  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   GERD (gastroesophageal reflux disease)    Head injury 06/25/2016   History of mammogram 11/2014   FIBROADENOMA   History of Papanicolaou smear of cervix 01/24/13; 10/15/15   -/-; -/+   Hypertension    Menorrhagia 02/01/2010   D/C HYSTEROSCOPY ENDO POLYP   MS (multiple sclerosis) (HCC)    Neuromuscular disorder (HCC)    Ovarian cyst 9/15; 2015-2016   LEFT - WENT TO [facility]; RIGHT   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   Sleep apnea    WEAR C PAP   Past Surgical History:  Procedure Laterality Date   BIOPSY  03/21/2019   Procedure: BIOPSY;  Surgeon: Lemar Lofty., MD;  Location: [facility] ENDOSCOPY;  Service: Gastroenterology;;   BREAST BIOPSY Right 2016   Fibroadenoma   CHOLECYSTECTOMY  2006   COLON RESECTION N/A 08/11/2019   Procedure: DIAGNOSTIC LAPAROSCOPY WITH WASH OUT, DRAIN PLACEMENT, AND LOOP ILEOSTOMY CREATION;  Surgeon: Romie Levee, MD;  Location: [facility] ORS;  Service: General;  Laterality: N/A;   COLONOSCOPY WITH PROPOFOL N/A 03/21/2019   Procedure: COLONOSCOPY WITH PROPOFOL;  Surgeon: Lemar Lofty., MD;  Location: [facility] ENDOSCOPY;  Service: Gastroenterology;  Laterality: N/A;  ultraslim colonoscope    CYSTOSCOPY WITH INJECTION N/A 08/04/2019   Procedure: FIREFLY INJECTION;  Surgeon: Crist Fat, MD;   Location: [facility] ORS;  Service: Urology;  Laterality: N/A;   DILATION AND CURETTAGE, DIAGNOSTIC / THERAPEUTIC  02/01/2010   D/C HYSTEROSCOPY ENDO POLYP; [facility]   ENDOSCOPIC MUCOSAL RESECTION  03/21/2019   Procedure: ENDOSCOPIC MUCOSAL RESECTION;  Surgeon: Meridee Score Netty Starring., MD;  Location: [facility] ENDOSCOPY;  Service: Gastroenterology;;   ESOPHAGOGASTRODUODENOSCOPY (EGD) WITH PROPOFOL N/A 03/21/2019   Procedure: ESOPHAGOGASTRODUODENOSCOPY (EGD) WITH PROPOFOL;  Surgeon: Lemar Lofty., MD;  Location: [facility] ENDOSCOPY;  Service: Gastroenterology;  Laterality: N/A;   EYE SURGERY     Lasik   FLEXIBLE SIGMOIDOSCOPY  07/31/2016   Procedure: FLEXIBLE SIGMOIDOSCOPY;  Surgeon: Romie Levee, MD;  Location: [facility] ENDOSCOPY;  Service: Endoscopy;;   HEMOSTASIS CLIP PLACEMENT  03/21/2019   Procedure: HEMOSTASIS CLIP PLACEMENT;  Surgeon: Lemar Lofty., MD;  Location: [facility] ENDOSCOPY;  Service: Gastroenterology;;   HYSTEROSCOPY  02/01/2010   ENDO POLYP - [facility]   IR RADIOLOGIST EVAL & MGMT  11/16/2018   IR RADIOLOGIST EVAL & MGMT  09/06/2019   SCLEROTHERAPY  03/21/2019   Procedure: Susa Day;  Surgeon: Lemar Lofty., MD;  Location: [facility] ENDOSCOPY;  Service: Gastroenterology;;   Current Outpatient Medications  Medication Sig Dispense Refill   busPIRone (BUSPAR) 15 MG tablet Take 15 mg by mouth 2 (two) times daily.     chlorthalidone (HYGROTON) 25 MG tablet TAKE ONE-HALF TABLET BY MOUTH  DAILY AS DIRECTED 45 tablet 1   cholecalciferol (VITAMIN D3) 25 MCG (1000 UT) tablet  Take 1,000 Units by mouth daily.     esomeprazole (NEXIUM) 40 MG capsule Take 1 capsule (40 mg total) by mouth 2 (two) times daily. After 2 months, please take only once daily. 90 capsule 3   gabapentin (NEURONTIN) 300 MG capsule One po qAM, one po qPM and two po qHS 360 capsule 4   LORazepam (ATIVAN) 1 MG tablet TAKE 1 TABLET BY MOUTH DAILY AS  NEEDED FOR ANXIETY 90 tablet 0   mirabegron ER (MYRBETRIQ) 25 MG TB24 tablet Take  1 tablet (25 mg total) by mouth daily. 90 tablet 1   Teriflunomide 14 MG TABS Take 1 tablet (14 mg total) by mouth daily. 90 tablet 1   TRELEGY ELLIPTA 200-62.5-25 MCG/ACT AEPB Inhale 1 puff into the lungs daily.     acetaminophen (TYLENOL) 325 MG tablet Take 2 tablets (650 mg total) by mouth every 6 (six) hours as needed for mild pain (or Fever >/= 101).     celecoxib (CELEBREX) 200 MG capsule SMARTSIG:2 Capsule(s) By Mouth Daily PRN     cyclobenzaprine (FLEXERIL) 5 MG tablet Take 1 tablet (5 mg total) by mouth at bedtime as needed. (Patient taking differently: Take 5 mg by mouth at bedtime as needed for muscle spasms.) 30 tablet 11   levonorgestrel (MIRENA) 20 MCG/DAY IUD 1 each by Intrauterine route once.     lidocaine (LIDODERM) 5 % Place 1 patch onto the skin daily. Remove & Discard patch within 12 hours or as directed by MD (Patient taking differently: Place 1 patch onto the skin as needed. Remove & Discard patch within 12 hours or as directed by MD) 90 patch 0   meloxicam (MOBIC) 15 MG tablet Take 1 tablet (15 mg total) by mouth daily. (Patient taking differently: Take 15 mg by mouth daily as needed for pain.) 30 tablet 5   mometasone (ELOCON) 0.1 % cream Apply 1 application topically daily as needed (Rash). Up to 5 times per week 45 g 1   ondansetron (ZOFRAN-ODT) 4 MG disintegrating tablet Take 4 mg by mouth every 6 (six) hours as needed for nausea or vomiting.      Polyvinyl Alcohol-Povidone (REFRESH OP) Place 2 drops into both eyes 4 (four) times daily as needed (dryness).     Probiotic Product (PROBIOTIC PO) Take 1 capsule by mouth daily.     Teriflunomide 14 MG TABS Take 1 tablet by mouth daily. 90 tablet 4   Current Facility-Administered Medications  Medication Dose Route Frequency Provider Last Rate Last Admin   0.9 %  sodium chloride infusion  500 mL Intravenous Once Mansouraty, Netty Starring., MD        Current Outpatient Medications:    busPIRone (BUSPAR) 15 MG tablet, Take 15 mg  by mouth 2 (two) times daily., Disp: , Rfl:    chlorthalidone (HYGROTON) 25 MG tablet, TAKE ONE-HALF TABLET BY MOUTH  DAILY AS DIRECTED, Disp: 45 tablet, Rfl: 1   cholecalciferol (VITAMIN D3) 25 MCG (1000 UT) tablet, Take 1,000 Units by mouth daily., Disp: , Rfl:    esomeprazole (NEXIUM) 40 MG capsule, Take 1 capsule (40 mg total) by mouth 2 (two) times daily. After 2 months, please take only once daily., Disp: 90 capsule, Rfl: 3   gabapentin (NEURONTIN) 300 MG capsule, One po qAM, one po qPM and two po qHS, Disp: 360 capsule, Rfl: 4   LORazepam (ATIVAN) 1 MG tablet, TAKE 1 TABLET BY MOUTH DAILY AS  NEEDED FOR ANXIETY, Disp: 90 tablet, Rfl: 0   mirabegron  ER (MYRBETRIQ) 25 MG TB24 tablet, Take 1 tablet (25 mg total) by mouth daily., Disp: 90 tablet, Rfl: 1   Teriflunomide 14 MG TABS, Take 1 tablet (14 mg total) by mouth daily., Disp: 90 tablet, Rfl: 1   TRELEGY ELLIPTA 200-62.5-25 MCG/ACT AEPB, Inhale 1 puff into the lungs daily., Disp: , Rfl:    acetaminophen (TYLENOL) 325 MG tablet, Take 2 tablets (650 mg total) by mouth every 6 (six) hours as needed for mild pain (or Fever >/= 101)., Disp: , Rfl:    celecoxib (CELEBREX) 200 MG capsule, SMARTSIG:2 Capsule(s) By Mouth Daily PRN, Disp: , Rfl:    cyclobenzaprine (FLEXERIL) 5 MG tablet, Take 1 tablet (5 mg total) by mouth at bedtime as needed. (Patient taking differently: Take 5 mg by mouth at bedtime as needed for muscle spasms.), Disp: 30 tablet, Rfl: 11   levonorgestrel (MIRENA) 20 MCG/DAY IUD, 1 each by Intrauterine route once., Disp: , Rfl:    lidocaine (LIDODERM) 5 %, Place 1 patch onto the skin daily. Remove & Discard patch within 12 hours or as directed by MD (Patient taking differently: Place 1 patch onto the skin as needed. Remove & Discard patch within 12 hours or as directed by MD), Disp: 90 patch, Rfl: 0   meloxicam (MOBIC) 15 MG tablet, Take 1 tablet (15 mg total) by mouth daily. (Patient taking differently: Take 15 mg by mouth daily as  needed for pain.), Disp: 30 tablet, Rfl: 5   mometasone (ELOCON) 0.1 % cream, Apply 1 application topically daily as needed (Rash). Up to 5 times per week, Disp: 45 g, Rfl: 1   ondansetron (ZOFRAN-ODT) 4 MG disintegrating tablet, Take 4 mg by mouth every 6 (six) hours as needed for nausea or vomiting. , Disp: , Rfl:    Polyvinyl Alcohol-Povidone (REFRESH OP), Place 2 drops into both eyes 4 (four) times daily as needed (dryness)., Disp: , Rfl:    Probiotic Product (PROBIOTIC PO), Take 1 capsule by mouth daily., Disp: , Rfl:    Teriflunomide 14 MG TABS, Take 1 tablet by mouth daily., Disp: 90 tablet, Rfl: 4  Current Facility-Administered Medications:    0.9 %  sodium chloride infusion, 500 mL, Intravenous, Once, Mansouraty, Netty Starring., MD Allergies  Allergen Reactions   Sulfamethoxazole-Trimethoprim Rash and Hives   Sulfa Antibiotics Other (See Comments) and Rash   Family History  Problem Relation Age of Onset   Rheum arthritis Mother    Hypertension Mother    Thyroid disease Mother        HYPOTHYROIDISM   Lung cancer Mother 27   Heart disease Father    Diabetes Father    Hypertension Father    Breast cancer Maternal Aunt 41       has contact   Cancer Paternal Grandmother        MELANOMA OF SKIN   Cancer Cousin        KIDNEY-COUSIN   Colon cancer Neg Hx    Stomach cancer Neg Hx    Pancreatic cancer Neg Hx    Esophageal cancer Neg Hx    Inflammatory bowel disease Neg Hx    Rectal cancer Neg Hx    Colon polyps Neg Hx    Crohn's disease Neg Hx    Ulcerative colitis Neg Hx    Social History   Socioeconomic History   Marital status: Divorced    Spouse name: Not on file   Number of children: 0   Years of education: 14   Highest education  level: Not on file  Occupational History    Employer: [employer]  Tobacco Use   Smoking status: Former    Years: 10    Types: Cigarettes    Quit date: 2010    Years since quitting: 14.3   Smokeless tobacco: Never  Vaping Use   Vaping  Use: Never used  Substance and Sexual Activity   Alcohol use: No   Drug use: No   Sexual activity: Yes    Partners: Male    Birth control/protection: I.U.D.    Comment: Mirena  Other Topics Concern   Not on file  Social History Narrative   Not on file   Social Determinants of Health   Financial Resource Strain: Not on file  Food Insecurity: Not on file  Transportation Needs: Not on file  Physical Activity: Not on file  Stress: Not on file  Social Connections: Not on file  Intimate Partner Violence: Not on file    Physical Exam: Today's Vitals   08/05/22 0932 08/05/22 0937  BP: (!) 147/92   Pulse: 81   Temp:  97.8 F (36.6 C)  SpO2: 97%   Weight: 260 lb (117.9 kg)   Height: 5\' 6"  (1.676 m)    Body mass index is 41.97 kg/m. GEN: NAD EYE: Sclerae anicteric ENT: MMM CV: Non-tachycardic GI: Soft, NT/ND NEURO:  Alert & Oriented x 3  Lab Results: No results for input(s): "WBC", "HGB", "HCT", "PLT" in the last 72 hours. BMET No results for input(s): "NA", "K", "CL", "CO2", "GLUCOSE", "BUN", "CREATININE", "CALCIUM" in the last 72 hours. LFT No results for input(s): "PROT", "ALBUMIN", "AST", "ALT", "ALKPHOS", "BILITOT", "BILIDIR", "IBILI" in the last 72 hours. PT/INR No results for input(s): "LABPROT", "INR" in the last 72 hours.   Impression / Plan: This is a 47 y.o.female who presents for EGD for follow up esophagitis.   The risks and benefits of endoscopic evaluation/treatment were discussed with the patient and/or family; these include but are not limited to the risk of perforation, infection, bleeding, missed lesions, lack of diagnosis, severe illness requiring hospitalization, as well as anesthesia and sedation related illnesses.  The patient's history has been reviewed, patient examined, no change in status, and deemed stable for procedure.  The patient and/or family is agreeable to proceed.    Corliss Parish, MD [facility]  Endoscopy Office # [phone]

## 2022-08-06 ENCOUNTER — Telehealth: Payer: Self-pay | Admitting: *Deleted

## 2022-08-06 NOTE — Telephone Encounter (Signed)
  Follow up Call-     08/05/2022    9:37 AM 02/05/2022    8:06 AM  Call back number  Post procedure Call Back phone  # [phone]  Permission to leave phone message Yes Yes     Patient questions:  Message left to call us if necessary.

## 2022-08-07 ENCOUNTER — Other Ambulatory Visit: Payer: Self-pay | Admitting: Internal Medicine

## 2022-08-08 ENCOUNTER — Ambulatory Visit: Payer: BC Managed Care – PPO | Admitting: Internal Medicine

## 2022-08-08 ENCOUNTER — Encounter: Payer: Self-pay | Admitting: Internal Medicine

## 2022-08-08 ENCOUNTER — Encounter: Payer: Self-pay | Admitting: Gastroenterology

## 2022-08-08 DIAGNOSIS — I10 Essential (primary) hypertension: Secondary | ICD-10-CM | POA: Diagnosis not present

## 2022-08-08 DIAGNOSIS — F419 Anxiety disorder, unspecified: Secondary | ICD-10-CM

## 2022-08-08 DIAGNOSIS — K2960 Other gastritis without bleeding: Secondary | ICD-10-CM

## 2022-08-08 DIAGNOSIS — R7303 Prediabetes: Secondary | ICD-10-CM

## 2022-08-08 DIAGNOSIS — K2970 Gastritis, unspecified, without bleeding: Secondary | ICD-10-CM | POA: Insufficient documentation

## 2022-08-08 MED ORDER — WEGOVY 0.5 MG/0.5ML ~~LOC~~ SOAJ
0.5000 mg | SUBCUTANEOUS | 2 refills | Status: AC
Start: 2022-08-08 — End: 2022-10-31

## 2022-08-08 MED ORDER — FLUOXETINE HCL 10 MG PO CAPS
10.0000 mg | ORAL_CAPSULE | Freq: Every day | ORAL | 2 refills | Status: DC
Start: 2022-08-08 — End: 2022-10-27

## 2022-08-08 NOTE — Progress Notes (Signed)
Established Patient Office Visit  Subjective:  Patient ID: Sandra Bonilla, female    DOB: 1975/09/16  Age: 47 y.o. MRN: [id number]  Chief Complaint  Patient presents with   Follow-up    2 month follow up    No new complaints, here medication refills. C/o weight gain as she'Mahsa Hanser unable to tolerate current dose of Wegovy due to nausea. Also c/o uncontrolled anxiety on current medications. Still c/o daytime somnolence despite using CPAP machine.     No other concerns at this time.   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   GERD (gastroesophageal reflux disease)    Head injury 06/25/2016   History of mammogram 11/2014   FIBROADENOMA   History of Papanicolaou smear of cervix 01/24/13; 10/15/15   -/-; -/+   Hypertension    Menorrhagia 02/01/2010   D/C HYSTEROSCOPY ENDO POLYP   MS (multiple sclerosis) (HCC)    Neuromuscular disorder (HCC)    Ovarian cyst 9/15; 2015-2016   LEFT - WENT TO [facility]; RIGHT   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   Sleep apnea    WEAR C PAP    Past Surgical History:  Procedure Laterality Date   BIOPSY  03/21/2019   Procedure: BIOPSY;  Surgeon: Lemar Lofty., MD;  Location: [facility] ENDOSCOPY;  Service: Gastroenterology;;   BREAST BIOPSY Right 2016   Fibroadenoma   CHOLECYSTECTOMY  2006   COLON RESECTION N/A 08/11/2019   Procedure: DIAGNOSTIC LAPAROSCOPY WITH WASH OUT, DRAIN PLACEMENT, AND LOOP ILEOSTOMY CREATION;  Surgeon: Romie Levee, MD;  Location: [facility] OR[facility];  Service: General;  Laterality: N/A;   COLONOSCOPY WITH  PROPOFOL N/A 03/21/2019   Procedure: COLONOSCOPY WITH PROPOFOL;  Surgeon: Lemar Lofty., MD;  Location: [facility] ENDOSCOPY;  Service: Gastroenterology;  Laterality: N/A;  ultraslim colonoscope    CYSTOSCOPY WITH INJECTION N/A 08/04/2019   Procedure: FIREFLY INJECTION;  Surgeon: Crist Fat, MD;  Location: [facility] ORS;  Service: Urology;  Laterality: N/A;   DILATION AND CURETTAGE, DIAGNOSTIC / THERAPEUTIC  02/01/2010   D/C HYSTEROSCOPY ENDO POLYP; [facility]   ENDOSCOPIC MUCOSAL RESECTION  03/21/2019   Procedure: ENDOSCOPIC MUCOSAL RESECTION;  Surgeon: Meridee Score Netty Starring., MD;  Location: [facility] ENDOSCOPY;  Service: Gastroenterology;;   ESOPHAGOGASTRODUODENOSCOPY (EGD) WITH PROPOFOL N/A 03/21/2019   Procedure: ESOPHAGOGASTRODUODENOSCOPY (EGD) WITH PROPOFOL;  Surgeon: Lemar Lofty., MD;  Location: [facility] ENDOSCOPY;  Service: Gastroenterology;  Laterality: N/A;   EYE SURGERY     Lasik   FLEXIBLE SIGMOIDOSCOPY  07/31/2016   Procedure: FLEXIBLE SIGMOIDOSCOPY;  Surgeon: Romie Levee, MD;  Location: [facility] ENDOSCOPY;  Service: Endoscopy;;   HEMOSTASIS CLIP PLACEMENT  03/21/2019   Procedure: HEMOSTASIS CLIP PLACEMENT;  Surgeon: Lemar Lofty., MD;  Location: [facility] ENDOSCOPY;  Service: Gastroenterology;;   HYSTEROSCOPY  02/01/2010   ENDO POLYP - [facility]   IR RADIOLOGIST EVAL & MGMT  11/16/2018   IR RADIOLOGIST EVAL & MGMT  09/06/2019   SCLEROTHERAPY  03/21/2019   Procedure: Susa Day;  Surgeon: Mansouraty, Netty Starring., MD;  Location: [facility]  ENDOSCOPY;  Service: Gastroenterology;;    Social History   Socioeconomic History   Marital status: Divorced    Spouse name: Not on file   Number of children: 0   Years of education: 14   Highest education level: Not on file  Occupational History    Employer: [employer]  Tobacco Use   Smoking status: Former    Years: 10    Types: Cigarettes    Quit date: 2010    Years since quitting: 14.3   Smokeless tobacco: Never  Vaping Use   Vaping Use:  Never used  Substance and Sexual Activity   Alcohol use: No   Drug use: No   Sexual activity: Yes    Partners: Male    Birth control/protection: I.U.D.    Comment: Mirena  Other Topics Concern   Not on file  Social History Narrative   Not on file   Social Determinants of Health   Financial Resource Strain: Not on file  Food Insecurity: Not on file  Transportation Needs: Not on file  Physical Activity: Not on file  Stress: Not on file  Social Connections: Not on file  Intimate Partner Violence: Not on file    Family History  Problem Relation Age of Onset   Rheum arthritis Mother    Hypertension Mother    Thyroid disease Mother        HYPOTHYROIDISM   Lung cancer Mother 47   Heart disease Father    Diabetes Father    Hypertension Father    Breast cancer Maternal Aunt 5       has contact   Cancer Paternal Grandmother        MELANOMA OF SKIN   Cancer Cousin        KIDNEY-COUSIN   Colon cancer Neg Hx    Stomach cancer Neg Hx    Pancreatic cancer Neg Hx    Esophageal cancer Neg Hx    Inflammatory bowel disease Neg Hx    Rectal cancer Neg Hx    Colon polyps Neg Hx    Crohn'Madelina Sanda disease Neg Hx    Ulcerative colitis Neg Hx     Allergies  Allergen Reactions   Sulfamethoxazole-Trimethoprim Rash and Hives   Sulfa Antibiotics Other (See Comments) and Rash    Review of Systems  All other systems reviewed and are negative.      Objective:   BP 126/84   Pulse 90   Ht 5\' 6"  (1.676 m)   Wt 272 lb 9.6 oz (123.7 kg)   SpO2 99%   BMI 44.00 kg/m   Vitals:   08/08/22 1357  BP: 126/84  Pulse: 90  Height: 5\' 6"  (1.676 m)  Weight: 272 lb 9.6 oz (123.7 kg)  SpO2: 99%  BMI (Calculated): 44.02    Physical Exam Vitals reviewed.  Constitutional:      General: She is not in acute distress.    Appearance: She is obese.  HENT:     Head: Normocephalic.     Nose: Nose normal.     Mouth/Throat:     Mouth: Mucous membranes are moist.  Eyes:     Extraocular  Movements: Extraocular movements intact.     Pupils: Pupils are equal, round, and reactive to light.  Cardiovascular:     Rate and Rhythm: Normal rate and regular rhythm.     Heart sounds: No murmur heard. Pulmonary:     Effort: Pulmonary effort is normal.     Breath sounds: No rhonchi or  rales.  Abdominal:     General: Abdomen is flat.     Palpations: There is no hepatomegaly, splenomegaly or mass.  Musculoskeletal:        General: Normal range of motion.     Cervical back: Normal range of motion. No tenderness.  Skin:    General: Skin is warm and dry.  Neurological:     General: No focal deficit present.     Mental Status: She is alert and oriented to person, place, and time.     Cranial Nerves: No cranial nerve deficit.     Motor: No weakness.  Psychiatric:        Mood and Affect: Mood normal.        Behavior: Behavior normal.      No results found for any visits on 08/08/22.  Recent Results (from the past 2160 hour(Rakhi Romagnoli))  CBC with Differential/Platelets     Status: Abnormal   Collection Time: 07/29/22  2:58 PM  Result Value Ref Range   WBC 8.7 3.4 - 10.8 x10E3/uL   RBC 5.14 3.77 - 5.28 x10E6/uL   Hemoglobin 13.8 11.1 - 15.9 g/dL   Hematocrit 16.1 09.6 - 46.6 %   MCV 82 79 - 97 fL   MCH 26.8 26.6 - 33.0 pg   MCHC 32.9 31.5 - 35.7 g/dL   RDW 04.5 40.9 - 81.1 %   Platelets 333 150 - 450 x10E3/uL   Neutrophils 58 Not Estab. %   Lymphs 28 Not Estab. %   Monocytes 11 Not Estab. %   Eos 2 Not Estab. %   Basos 1 Not Estab. %   Neutrophils Absolute 5.0 1.4 - 7.0 x10E3/uL   Lymphocytes Absolute 2.4 0.7 - 3.1 x10E3/uL   Monocytes Absolute 1.0 (H) 0.1 - 0.9 x10E3/uL   EOS (ABSOLUTE) 0.2 0.0 - 0.4 x10E3/uL   Basophils Absolute 0.1 0.0 - 0.2 x10E3/uL   Immature Granulocytes 0 Not Estab. %   Immature Grans (Abs) 0.0 0.0 - 0.1 x10E3/uL  Hepatic Function Panel     Status: Abnormal   Collection Time: 07/29/22  2:58 PM  Result Value Ref Range   Total Protein 6.6 6.0 - 8.5  g/dL   Albumin 4.2 3.9 - 4.9 g/dL   Bilirubin Total 0.8 0.0 - 1.2 mg/dL   Bilirubin, Direct 9.14 0.00 - 0.40 mg/dL   Alkaline Phosphatase 148 (H) 44 - 121 IU/L   AST 29 0 - 40 IU/L   ALT 26 0 - 32 IU/L  Vitamin D, 25-hydroxy     Status: None   Collection Time: 07/29/22  2:58 PM  Result Value Ref Range   Vit D, 25-Hydroxy 68.6 30.0 - 100.0 ng/mL    Comment: Vitamin D deficiency has been defined by the Institute of Medicine and an Endocrine Society practice guideline as a level of serum 25-OH vitamin D less than 20 ng/mL (1,2). The Endocrine Society went on to further define vitamin D insufficiency as a level between 21 and 29 ng/mL (2). 1. IOM (Institute of Medicine). 2010. Dietary reference    intakes for calcium and D. [place]: The    [employer]. 2. Holick MF, Binkley Tina, Bischoff-Ferrari HA, et al.    Evaluation, treatment, and prevention of vitamin D    deficiency: an Endocrine Society clinical practice    guideline. JCEM. 2011 Jul; 96(7):1911-30.       Assessment & Plan:   Problem List Items Addressed This Visit   None   No follow-ups  on file.   Total time spent: 20 minutes  Luna Fuse, MD  08/08/2022

## 2022-08-12 ENCOUNTER — Ambulatory Visit
Admission: RE | Admit: 2022-08-12 | Discharge: 2022-08-12 | Disposition: A | Discharge status: Home / Self Care | Payer: BC Managed Care – PPO | Source: Ambulatory Visit | Attending: Family Medicine | Admitting: Family Medicine

## 2022-08-12 DIAGNOSIS — G35 Multiple sclerosis: Secondary | ICD-10-CM | POA: Diagnosis present

## 2022-08-12 MED ORDER — GADOBUTROL 1 MMOL/ML IV SOLN
10.0000 mL | Freq: Once | INTRAVENOUS | Status: AC | PRN
  Administered 2022-08-12: 10 mL via INTRAVENOUS

## 2022-08-13 ENCOUNTER — Encounter: Payer: Self-pay | Admitting: Internal Medicine

## 2022-10-08 ENCOUNTER — Other Ambulatory Visit: Payer: Self-pay

## 2022-10-08 ENCOUNTER — Other Ambulatory Visit: Payer: BC Managed Care – PPO

## 2022-10-08 DIAGNOSIS — R7303 Prediabetes: Secondary | ICD-10-CM

## 2022-10-09 LAB — COMPREHENSIVE METABOLIC PANEL
ALT: 31 IU/L (ref 0–32)
AST: 27 IU/L (ref 0–40)
Albumin: 4.1 g/dL (ref 3.9–4.9)
Alkaline Phosphatase: 161 IU/L — ABNORMAL HIGH (ref 44–121)
BUN/Creatinine Ratio: 13 (ref 9–23)
BUN: 17 mg/dL (ref 6–24)
Bilirubin Total: 0.8 mg/dL (ref 0.0–1.2)
CO2: 24 mmol/L (ref 20–29)
Calcium: 9.6 mg/dL (ref 8.7–10.2)
Chloride: 99 mmol/L (ref 96–106)
Creatinine, Ser: 1.35 mg/dL — ABNORMAL HIGH (ref 0.57–1.00)
Globulin, Total: 2.9 g/dL (ref 1.5–4.5)
Glucose: 88 mg/dL (ref 70–99)
Potassium: 3.8 mmol/L (ref 3.5–5.2)
Sodium: 139 mmol/L (ref 134–144)
Total Protein: 7 g/dL (ref 6.0–8.5)
eGFR: 48 mL/min/{1.73_m2} — ABNORMAL LOW (ref >59)

## 2022-10-09 LAB — LIPID PANEL
Chol/HDL Ratio: 2.7 ratio (ref 0.0–4.4)
Cholesterol, Total: 174 mg/dL (ref 100–199)
HDL: 64 mg/dL (ref >39)
LDL Chol Calc (NIH): 90 mg/dL (ref 0–99)
Triglycerides: 110 mg/dL (ref 0–149)
VLDL Cholesterol Cal: 20 mg/dL (ref 5–40)

## 2022-10-09 LAB — HEMOGLOBIN A1C
Est. average glucose Bld gHb Est-mCnc: 117 mg/dL
Hgb A1c MFr Bld: 5.7 % — ABNORMAL HIGH (ref 4.8–5.6)

## 2022-10-10 ENCOUNTER — Ambulatory Visit: Payer: BC Managed Care – PPO | Admitting: Internal Medicine

## 2022-10-10 VITALS — BP 121/80 | HR 88 | Ht 66.0 in | Wt 262.8 lb

## 2022-10-10 DIAGNOSIS — F419 Anxiety disorder, unspecified: Secondary | ICD-10-CM

## 2022-10-10 DIAGNOSIS — N1831 Chronic kidney disease, stage 3a: Secondary | ICD-10-CM | POA: Diagnosis not present

## 2022-10-10 MED ORDER — LORAZEPAM 1 MG PO TABS: 1.0000 mg | ORAL_TABLET | Freq: Every day | ORAL | 0 refills | Status: DC | PRN

## 2022-10-10 NOTE — Progress Notes (Signed)
Established Patient Office Visit  Subjective:  Patient ID: Sandra Bonilla, female    DOB: 04-Jul-1975  Age: 47 y.o. MRN: [id number]  Chief Complaint  Patient presents with   Follow-up    2 mo lab F/U    No new complaints, here for lab review and medication refills. Lost weight with NWGNFA which she is tolerating well. LDL and TC well controlled on lab review. Triglycerides also satisfactory and CMP notable for gfr of 48 from 57 a year ago.    No other concerns at this time.   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   GERD (gastroesophageal reflux disease)    Head injury 06/25/2016   History of mammogram 11/2014   FIBROADENOMA   History of Papanicolaou smear of cervix 01/24/13; 10/15/15   -/-; -/+   Hypertension    Menorrhagia 02/01/2010   D/C HYSTEROSCOPY ENDO POLYP   MS (multiple sclerosis) (HCC)    Neuromuscular disorder (HCC)    Ovarian cyst 9/15; 2015-2016   LEFT - WENT TO [facility]; RIGHT   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   Sleep apnea    WEAR C PAP    Past Surgical History:  Procedure Laterality Date   BIOPSY  03/21/2019   Procedure: BIOPSY;  Surgeon: Lemar Lofty., MD;  Location: [facility] ENDOSCOPY;  Service: Gastroenterology;;   BREAST BIOPSY Right 2016   Fibroadenoma   CHOLECYSTECTOMY  2006   COLON RESECTION N/A 08/11/2019   Procedure: DIAGNOSTIC LAPAROSCOPY WITH WASH OUT, DRAIN PLACEMENT, AND LOOP ILEOSTOMY CREATION;  Surgeon: Romie Levee, MD;  Location: [facility] ORS;  Service: General;  Laterality: N/A;   COLONOSCOPY WITH  PROPOFOL N/A 03/21/2019   Procedure: COLONOSCOPY WITH PROPOFOL;  Surgeon: Lemar Lofty., MD;  Location: [facility] ENDOSCOPY;  Service: Gastroenterology;  Laterality: N/A;  ultraslim colonoscope    CYSTOSCOPY WITH INJECTION N/A 08/04/2019   Procedure: FIREFLY INJECTION;  Surgeon: Crist Fat, MD;  Location: [facility] ORS;  Service: Urology;  Laterality: N/A;   DILATION AND CURETTAGE, DIAGNOSTIC / THERAPEUTIC  02/01/2010   D/C HYSTEROSCOPY ENDO POLYP; [facility]   ENDOSCOPIC MUCOSAL RESECTION  03/21/2019   Procedure: ENDOSCOPIC MUCOSAL RESECTION;  Surgeon: Meridee Score Netty Starring., MD;  Location[facility] ENDOSCOPY;  Service: Gastroenterology;;   ESOPHAGOGASTRODUODENOSCOPY (EGD) WITH PROPOFOL N/A 03/21/2019   Procedure: ESOPHAGOGASTRODUODENOSCOPY (EGD) WITH PROPOFOL;  Surgeon: Lemar Lofty., MD;  Location: [facility] ENDOSCOPY;  Service: Gastroenterology;  Laterality: N/A;   EYE SURGERY     Lasik   FLEXIBLE SIGMOIDOSCOPY  07/31/2016   Procedure: FLEXIBLE SIGMOIDOSCOPY;  Surgeon: Romie Levee, MD;  Location: [facility] ENDOSCOPY;  Service: Endoscopy;;   HEMOSTASIS CLIP PLACEMENT  03/21/2019   Procedure: HEMOSTASIS CLIP PLACEMENT;  Surgeon: Lemar Lofty., MD;  Location: [facility] ENDOSCOPY;  Service: Gastroenterology;;   HYSTEROSCOPY  02/01/2010   ENDO POLYP - [facility]   IR RADIOLOGIST EVAL & MGMT  11/16/2018   IR RADIOLOGIST EVAL & MGMT  09/06/2019   SCLEROTHERAPY  03/21/2019   Procedure: Susa Day;  Surgeon: Corliss Parish  Montez Hageman., MD;  Location: [facility] ENDOSCOPY;  Service: Gastroenterology;;    Social History   Socioeconomic History   Marital status: Divorced    Spouse name: Not on file   Number of children: 0   Years of education: 14   Highest education level: Not on file  Occupational History    Employer: [employer]  Tobacco Use   Smoking status: Former    Current packs/day: 0.00    Types: Cigarettes    Start date: 2000    Quit date: 2010    Years since quitting: 14.5   Smokeless tobacco:  Never  Vaping Use   Vaping status: Never Used  Substance and Sexual Activity   Alcohol use: No   Drug use: No   Sexual activity: Yes    Partners: Male    Birth control/protection: I.U.D.    Comment: Mirena  Other Topics Concern   Not on file  Social History Narrative   Not on file   Social Determinants of Health   Financial Resource Strain: Not on file  Food Insecurity: Not on file  Transportation Needs: Not on file  Physical Activity: Not on file  Stress: Not on file  Social Connections: Not on file  Intimate Partner Violence: Not on file    Family History  Problem Relation Age of Onset   Rheum arthritis Mother    Hypertension Mother    Thyroid disease Mother        HYPOTHYROIDISM   Lung cancer Mother 28   Heart disease Father    Diabetes Father    Hypertension Father    Breast cancer Maternal Aunt 34       has contact   Cancer Paternal Grandmother        MELANOMA OF SKIN   Cancer Cousin        KIDNEY-COUSIN   Colon cancer Neg Hx    Stomach cancer Neg Hx    Pancreatic cancer Neg Hx    Esophageal cancer Neg Hx    Inflammatory bowel disease Neg Hx    Rectal cancer Neg Hx    Colon polyps Neg Hx    Crohn'Irvine Glorioso disease Neg Hx    Ulcerative colitis Neg Hx     Allergies  Allergen Reactions   Sulfamethoxazole-Trimethoprim Rash and Hives   Sulfa Antibiotics Other (See Comments) and Rash    Review of Systems  All other systems reviewed and are negative.      Objective:   BP 121/80   Pulse 88   Ht 5\' 6"  (1.676 m)   Wt 262 lb 12.8 oz (119.2 kg)   SpO2 98%   BMI 42.42 kg/m   Vitals:   10/10/22 1100  BP: 121/80  Pulse: 88  Height: 5\' 6"  (1.676 m)  Weight: 262 lb 12.8 oz (119.2 kg)  SpO2: 98%  BMI (Calculated): 42.44    Physical Exam Vitals reviewed.  Constitutional:      General: She is not in acute distress.    Appearance: She is obese.  HENT:     Head: Normocephalic.     Nose: Nose normal.     Mouth/Throat:     Mouth: Mucous membranes are  moist.  Eyes:     Extraocular Movements: Extraocular movements intact.     Pupils: Pupils are equal, round, and reactive to light.  Cardiovascular:     Rate and Rhythm: Normal rate and regular rhythm.     Heart sounds: No murmur heard. Pulmonary:     Effort: Pulmonary  effort is normal.     Breath sounds: No rhonchi or rales.  Abdominal:     General: Abdomen is flat.     Palpations: There is no hepatomegaly, splenomegaly or mass.  Musculoskeletal:        General: Normal range of motion.     Cervical back: Normal range of motion. No tenderness.  Skin:    General: Skin is warm and dry.  Neurological:     General: No focal deficit present.     Mental Status: She is alert and oriented to person, place, and time.     Cranial Nerves: No cranial nerve deficit.     Motor: No weakness.  Psychiatric:        Mood and Affect: Mood normal.        Behavior: Behavior normal.      No results found for any visits on 10/10/22.  Recent Results (from the past 2160 hour(Maygan Koeller))  CBC with Differential/Platelets     Status: Abnormal   Collection Time: 07/29/22  2:58 PM  Result Value Ref Range   WBC 8.7 3.4 - 10.8 x10E3/uL   RBC 5.14 3.77 - 5.28 x10E6/uL   Hemoglobin 13.8 11.1 - 15.9 g/dL   Hematocrit 82.9 56.2 - 46.6 %   MCV 82 79 - 97 fL   MCH 26.8 26.6 - 33.0 pg   MCHC 32.9 31.5 - 35.7 g/dL   RDW 13.0 86.5 - 78.4 %   Platelets 333 150 - 450 x10E3/uL   Neutrophils 58 Not Estab. %   Lymphs 28 Not Estab. %   Monocytes 11 Not Estab. %   Eos 2 Not Estab. %   Basos 1 Not Estab. %   Neutrophils Absolute 5.0 1.4 - 7.0 x10E3/uL   Lymphocytes Absolute 2.4 0.7 - 3.1 x10E3/uL   Monocytes Absolute 1.0 (H) 0.1 - 0.9 x10E3/uL   EOS (ABSOLUTE) 0.2 0.0 - 0.4 x10E3/uL   Basophils Absolute 0.1 0.0 - 0.2 x10E3/uL   Immature Granulocytes 0 Not Estab. %   Immature Grans (Abs) 0.0 0.0 - 0.1 x10E3/uL  Hepatic Function Panel     Status: Abnormal   Collection Time: 07/29/22  2:58 PM  Result Value Ref Range    Total Protein 6.6 6.0 - 8.5 g/dL   Albumin 4.2 3.9 - 4.9 g/dL   Bilirubin Total 0.8 0.0 - 1.2 mg/dL   Bilirubin, Direct 6.96 0.00 - 0.40 mg/dL   Alkaline Phosphatase 148 (H) 44 - 121 IU/L   AST 29 0 - 40 IU/L   ALT 26 0 - 32 IU/L  Vitamin D, 25-hydroxy     Status: None   Collection Time: 07/29/22  2:58 PM  Result Value Ref Range   Vit D, 25-Hydroxy 68.6 30.0 - 100.0 ng/mL    Comment: Vitamin D deficiency has been defined by the Institute of Medicine and an Endocrine Society practice guideline as a level of serum 25-OH vitamin D less than 20 ng/mL (1,2). The Endocrine Society went on to further define vitamin D insufficiency as a level between 21 and 29 ng/mL (2). 1. IOM (Institute of Medicine). 2010. Dietary reference    intakes for calcium and D. [place]: The    [employer]. 2. Holick MF, Binkley Steptoe, Bischoff-Ferrari HA, et al.    Evaluation, treatment, and prevention of vitamin D    deficiency: an Endocrine Society clinical practice    guideline. JCEM. 2011 Jul; 96(7):1911-30.   Comprehensive metabolic panel     Status: Abnormal  Collection Time: 10/08/22  9:13 AM  Result Value Ref Range   Glucose 88 70 - 99 mg/dL   BUN 17 6 - 24 mg/dL   Creatinine, Ser 1.61 (H) 0.57 - 1.00 mg/dL   eGFR 48 (L) >09 UE/AVW/0.98   BUN/Creatinine Ratio 13 9 - 23   Sodium 139 134 - 144 mmol/L   Potassium 3.8 3.5 - 5.2 mmol/L   Chloride 99 96 - 106 mmol/L   CO2 24 20 - 29 mmol/L   Calcium 9.6 8.7 - 10.2 mg/dL   Total Protein 7.0 6.0 - 8.5 g/dL   Albumin 4.1 3.9 - 4.9 g/dL   Globulin, Total 2.9 1.5 - 4.5 g/dL   Bilirubin Total 0.8 0.0 - 1.2 mg/dL   Alkaline Phosphatase 161 (H) 44 - 121 IU/L   AST 27 0 - 40 IU/L   ALT 31 0 - 32 IU/L  Lipid panel     Status: None   Collection Time: 10/08/22  9:13 AM  Result Value Ref Range   Cholesterol, Total 174 100 - 199 mg/dL   Triglycerides 119 0 - 149 mg/dL   HDL 64 >14 mg/dL   VLDL Cholesterol Cal 20 5 - 40 mg/dL   LDL Chol Calc  (NIH) 90 0 - 99 mg/dL   Chol/HDL Ratio 2.7 0.0 - 4.4 ratio    Comment:                                   T. Chol/HDL Ratio                                             Men  Women                               1/2 Avg.Risk  3.4    3.3                                   Avg.Risk  5.0    4.4                                2X Avg.Risk  9.6    7.1                                3X Avg.Risk 23.4   11.0   Hemoglobin A1c     Status: Abnormal   Collection Time: 10/08/22  9:13 AM  Result Value Ref Range   Hgb A1c MFr Bld 5.7 (H) 4.8 - 5.6 %    Comment:          Prediabetes: 5.7 - 6.4          Diabetes: >6.4          Glycemic control for adults with diabetes: <7.0    Est. average glucose Bld gHb Est-mCnc 117 mg/dL      Assessment & Plan:   As per problem list. Encouraged to increase fluid intake and declines nephrology referral at this time. Problem List Items Addressed This Visit  Genitourinary   CKD stage 3a, GFR 45-59 ml/min (HCC)   Relevant Orders   BMP8+Anion Gap     Other   Anxiety - Primary   Relevant Medications   LORazepam (ATIVAN) 1 MG tablet   Morbid obesity (HCC)    Return in about 2 months (around 12/11/2022) for  weight management.   Total time spent: 30 minutes  Luna Fuse, MD  10/10/2022   This document may have been prepared by [facility] Voice Recognition software and as such may include unintentional dictation errors.

## 2022-10-14 ENCOUNTER — Other Ambulatory Visit: Payer: Self-pay | Admitting: Internal Medicine

## 2022-10-24 ENCOUNTER — Other Ambulatory Visit: Payer: Self-pay | Admitting: Internal Medicine

## 2022-10-24 DIAGNOSIS — F419 Anxiety disorder, unspecified: Secondary | ICD-10-CM

## 2022-11-05 ENCOUNTER — Telehealth: Payer: Self-pay

## 2022-11-05 NOTE — Telephone Encounter (Signed)
Spoke with pt who has prescribed 2 Rx options and at this time based of the Rx that was given pt was educated on using OTC options to assist with sore throat.

## 2022-11-14 ENCOUNTER — Other Ambulatory Visit: Payer: Self-pay | Admitting: Internal Medicine

## 2022-12-02 ENCOUNTER — Other Ambulatory Visit: Payer: Self-pay | Admitting: Internal Medicine

## 2022-12-02 ENCOUNTER — Other Ambulatory Visit: Payer: Self-pay

## 2022-12-02 DIAGNOSIS — F419 Anxiety disorder, unspecified: Secondary | ICD-10-CM

## 2022-12-02 MED ORDER — FLUOXETINE HCL 10 MG PO CAPS
10.0000 mg | ORAL_CAPSULE | Freq: Every day | ORAL | 1 refills | Status: DC
Start: 2022-12-02 — End: 2023-05-27

## 2022-12-12 ENCOUNTER — Ambulatory Visit: Payer: BC Managed Care – PPO | Admitting: Internal Medicine

## 2022-12-16 ENCOUNTER — Encounter: Payer: Self-pay | Admitting: Internal Medicine

## 2022-12-16 ENCOUNTER — Ambulatory Visit: Payer: BC Managed Care – PPO | Admitting: Internal Medicine

## 2022-12-16 DIAGNOSIS — R7303 Prediabetes: Secondary | ICD-10-CM | POA: Diagnosis not present

## 2022-12-16 MED ORDER — WEGOVY 1 MG/0.5ML ~~LOC~~ SOAJ
1.0000 mg | SUBCUTANEOUS | 1 refills | Status: AC
Start: 2022-12-16 — End: 2023-02-14

## 2022-12-16 NOTE — Progress Notes (Signed)
Established Patient Office Visit  Subjective:  Patient ID: Sandra Bonilla, female    DOB: Dec 04, 1975  Age: 47 y.o. MRN: [id number]  Chief Complaint  Patient presents with   Follow-up    2 mo f/u weight management    No new complaints, here for weight management. Lost 2 lbs since last evaluation although just returned from vacation.  No other concerns at this time.   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   GERD (gastroesophageal reflux disease)    Head injury 06/25/2016   History of mammogram 11/2014   FIBROADENOMA   History of Papanicolaou smear of cervix 01/24/13; 10/15/15   -/-; -/+   Hypertension    Menorrhagia 02/01/2010   D/C HYSTEROSCOPY ENDO POLYP   MS (multiple sclerosis) (HCC)    Neuromuscular disorder (HCC)    Ovarian cyst 9/15; 2015-2016   LEFT - WENT TO [place]; RIGHT   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   Sleep apnea    WEAR C PAP    Past Surgical History:  Procedure Laterality Date   BIOPSY  03/21/2019   Procedure: BIOPSY;  Surgeon: Lemar Lofty., MD;  Location: [facility] ENDOSCOPY;  Service: Gastroenterology;;   BREAST BIOPSY Right 2016   Fibroadenoma   CHOLECYSTECTOMY  2006   COLON RESECTION N/A 08/11/2019   Procedure: DIAGNOSTIC LAPAROSCOPY WITH WASH OUT, DRAIN PLACEMENT, AND LOOP ILEOSTOMY CREATION;  Surgeon: Romie Levee, MD;  Location: [facility] ORS;  Service: General;  Laterality: N/A;   COLONOSCOPY WITH PROPOFOL N/A 03/21/2019   Procedure: COLONOSCOPY WITH PROPOFOL;  Surgeon: Lemar Lofty., MD;  Location: [facility]  ENDOSCOPY;  Service: Gastroenterology;  Laterality: N/A;  ultraslim colonoscope    CYSTOSCOPY WITH INJECTION N/A 08/04/2019   Procedure: FIREFLY INJECTION;  Surgeon: Crist Fat, MD;  Location: [facility] ORS;  Service: Urology;  Laterality: N/A;   DILATION AND CURETTAGE, DIAGNOSTIC / THERAPEUTIC  02/01/2010   D/C HYSTEROSCOPY ENDO POLYP; [facility]   ENDOSCOPIC MUCOSAL RESECTION  03/21/2019   Procedure: ENDOSCOPIC MUCOSAL RESECTION;  Surgeon: Meridee Score Netty Starring., MD;  Location: [facility] ENDOSCOPY;  Service: Gastroenterology;;   ESOPHAGOGASTRODUODENOSCOPY (EGD) WITH PROPOFOL N/A 03/21/2019   Procedure: ESOPHAGOGASTRODUODENOSCOPY (EGD) WITH PROPOFOL;  Surgeon: Lemar Lofty., MD;  Location: [facility] ENDOSCOPY;  Service: Gastroenterology;  Laterality: N/A;   EYE SURGERY     Lasik   FLEXIBLE SIGMOIDOSCOPY  07/31/2016   Procedure: FLEXIBLE SIGMOIDOSCOPY;  Surgeon: Romie Levee, MD;  Location: [facility] ENDOSCOPY;  Service: Endoscopy;;   HEMOSTASIS CLIP PLACEMENT  03/21/2019   Procedure: HEMOSTASIS CLIP PLACEMENT;  Surgeon: Lemar Lofty., MD;  Location: [facility] ENDOSCOPY;  Service: Gastroenterology;;   HYSTEROSCOPY  02/01/2010   ENDO POLYP - [facility]   IR RADIOLOGIST EVAL & MGMT  11/16/2018   IR RADIOLOGIST EVAL & MGMT  09/06/2019   SCLEROTHERAPY  03/21/2019   Procedure: Susa Day;  Surgeon: Mansouraty, Netty Starring., MD;  Location: [facility] ENDOSCOPY;  Service: Gastroenterology;;    Social History   Socioeconomic History   Marital status: Divorced  Spouse name: Not on file   Number of children: 0   Years of education: 14   Highest education level: Not on file  Occupational History    Employer: [employer]  Tobacco Use   Smoking status: Former    Current packs/day: 0.00    Types: Cigarettes    Start date: 2000    Quit date: 2010    Years since quitting: 14.7   Smokeless tobacco: Never  Vaping Use   Vaping status: Never Used  Substance and Sexual Activity   Alcohol use: No   Drug use: No    Sexual activity: Yes    Partners: Male    Birth control/protection: I.U.D.    Comment: Mirena  Other Topics Concern   Not on file  Social History Narrative   Not on file   Social Determinants of Health   Financial Resource Strain: Not on file  Food Insecurity: Not on file  Transportation Needs: Not on file  Physical Activity: Not on file  Stress: Not on file  Social Connections: Not on file  Intimate Partner Violence: Not on file    Family History  Problem Relation Age of Onset   Rheum arthritis Mother    Hypertension Mother    Thyroid disease Mother        HYPOTHYROIDISM   Lung cancer Mother 21   Heart disease Father    Diabetes Father    Hypertension Father    Breast cancer Maternal Aunt 31       has contact   Cancer Paternal Grandmother        MELANOMA OF SKIN   Cancer Cousin        KIDNEY-COUSIN   Colon cancer Neg Hx    Stomach cancer Neg Hx    Pancreatic cancer Neg Hx    Esophageal cancer Neg Hx    Inflammatory bowel disease Neg Hx    Rectal cancer Neg Hx    Colon polyps Neg Hx    Crohn'Amaziah Ghosh disease Neg Hx    Ulcerative colitis Neg Hx     Allergies  Allergen Reactions   Sulfamethoxazole-Trimethoprim Rash and Hives   Sulfa Antibiotics Other (See Comments) and Rash    Review of Systems  Constitutional:  Positive for weight loss.  All other systems reviewed and are negative.      Objective:   BP 130/86   Pulse (!) 103   Ht 5\' 6"  (1.676 m)   Wt 260 lb 6.4 oz (118.1 kg)   SpO2 97%   BMI 42.03 kg/m   Vitals:   12/16/22 1147  BP: 130/86  Pulse: (!) 103  Height: 5\' 6"  (1.676 m)  Weight: 260 lb 6.4 oz (118.1 kg)  SpO2: 97%  BMI (Calculated): 42.05    Physical Exam Vitals reviewed.  Constitutional:      General: She is not in acute distress.    Appearance: She is obese.  HENT:     Head: Normocephalic.     Nose: Nose normal.     Mouth/Throat:     Mouth: Mucous membranes are moist.  Eyes:     Extraocular Movements: Extraocular movements  intact.     Pupils: Pupils are equal, round, and reactive to light.  Cardiovascular:     Rate and Rhythm: Normal rate and regular rhythm.     Heart sounds: No murmur heard. Pulmonary:     Effort: Pulmonary effort is normal.     Breath sounds: No rhonchi or rales.  Abdominal:  General: Abdomen is flat.     Palpations: There is no hepatomegaly, splenomegaly or mass.  Musculoskeletal:        General: Normal range of motion.     Cervical back: Normal range of motion. No tenderness.  Skin:    General: Skin is warm and dry.  Neurological:     General: No focal deficit present.     Mental Status: She is alert and oriented to person, place, and time.     Cranial Nerves: No cranial nerve deficit.     Motor: No weakness.  Psychiatric:        Mood and Affect: Mood normal.        Behavior: Behavior normal.      No results found for any visits on 12/16/22.  Recent Results (from the past 2160 hour(Josef Tourigny))  Comprehensive metabolic panel     Status: Abnormal   Collection Time: 10/08/22  9:13 AM  Result Value Ref Range   Glucose 88 70 - 99 mg/dL   BUN 17 6 - 24 mg/dL   Creatinine, Ser 7.82 (H) 0.57 - 1.00 mg/dL   eGFR 48 (L) >95 AO/ZHY/8.65   BUN/Creatinine Ratio 13 9 - 23   Sodium 139 134 - 144 mmol/L   Potassium 3.8 3.5 - 5.2 mmol/L   Chloride 99 96 - 106 mmol/L   CO2 24 20 - 29 mmol/L   Calcium 9.6 8.7 - 10.2 mg/dL   Total Protein 7.0 6.0 - 8.5 g/dL   Albumin 4.1 3.9 - 4.9 g/dL   Globulin, Total 2.9 1.5 - 4.5 g/dL   Bilirubin Total 0.8 0.0 - 1.2 mg/dL   Alkaline Phosphatase 161 (H) 44 - 121 IU/L   AST 27 0 - 40 IU/L   ALT 31 0 - 32 IU/L  Lipid panel     Status: None   Collection Time: 10/08/22  9:13 AM  Result Value Ref Range   Cholesterol, Total 174 100 - 199 mg/dL   Triglycerides 784 0 - 149 mg/dL   HDL 64 >69 mg/dL   VLDL Cholesterol Cal 20 5 - 40 mg/dL   LDL Chol Calc (NIH) 90 0 - 99 mg/dL   Chol/HDL Ratio 2.7 0.0 - 4.4 ratio    Comment:                                    T. Chol/HDL Ratio                                             Men  Women                               1/2 Avg.Risk  3.4    3.3                                   Avg.Risk  5.0    4.4                                2X Avg.Risk  9.6    7.1  3X Avg.Risk 23.4   11.0   Hemoglobin A1c     Status: Abnormal   Collection Time: 10/08/22  9:13 AM  Result Value Ref Range   Hgb A1c MFr Bld 5.7 (H) 4.8 - 5.6 %    Comment:          Prediabetes: 5.7 - 6.4          Diabetes: >6.4          Glycemic control for adults with diabetes: <7.0    Est. average glucose Bld gHb Est-mCnc 117 mg/dL      Assessment & Plan:  As per problem list  Problem List Items Addressed This Visit       Other   Morbid obesity (HCC) - Primary   Relevant Medications   Semaglutide-Weight Management (WEGOVY) 1 MG/0.5ML SOAJ    Return in about 2 months (around 02/15/2023) for fu with labs prior.   Total time spent: 20 minutes  Luna Fuse, MD  12/16/2022   This document may have been prepared by [facility] Voice Recognition software and as such may include unintentional dictation errors.

## 2022-12-29 ENCOUNTER — Other Ambulatory Visit: Payer: Self-pay

## 2022-12-29 ENCOUNTER — Other Ambulatory Visit: Payer: Self-pay | Admitting: Internal Medicine

## 2022-12-29 ENCOUNTER — Encounter: Payer: Self-pay | Admitting: Internal Medicine

## 2022-12-29 MED ORDER — WEGOVY 0.5 MG/0.5ML ~~LOC~~ SOAJ
0.5000 mg | SUBCUTANEOUS | 0 refills | Status: AC
Start: 2022-12-29 — End: 2023-01-27

## 2023-01-17 ENCOUNTER — Other Ambulatory Visit: Payer: Self-pay | Admitting: Neurology

## 2023-01-17 ENCOUNTER — Other Ambulatory Visit: Payer: Self-pay | Admitting: Internal Medicine

## 2023-01-19 ENCOUNTER — Other Ambulatory Visit: Payer: Self-pay | Admitting: Internal Medicine

## 2023-01-19 NOTE — Telephone Encounter (Signed)
Last seen on 07/29/22 Follow up scheduled on 02/05/23

## 2023-01-21 NOTE — Progress Notes (Unsigned)
No chief complaint on file.    HPI:      [name] is a 47 y.o. G0P0000 who LMP was No LMP recorded. (Menstrual status: IUD)., presents today for her annual examination.  Her menses are now monthly with IUD, lasting 5-7 days, mod flow with small clots, no BTB. Dysmenorrhea none. Bleeding was irregular spotting initially but getting more each month now.   Sex activity: not currently. Has Mirena, replaced 10/25/21 after IUD strings lost and GYN u/s didn't show IUD in place 6/23.   Last Pap: 05/15/21 Results were normal, neg HPV DNA. 05/14/20 Results were neg cells/neg HPV DNA  01/26/19 neg cells, POS HPV DNA.  12/01/17 ASCUS/neg HPV DNA.  07/01/17  no abnormalities /neg HPV DNA.  10/15/16 pap was LGSIL/POS HPV DNA. Neg colpo and bx 9/18.  She is due for repeat pap today. She is also on immune modulator med for MS and should have yearly paps.  Hx of STDs: HPV  Last mammogram: 07/17/21 Cat 3 LT breast mass; repeat in 6 months; 01/13/22 cat 3 LT breast, repeat dx mammo and u/s in 6 months; 05/29/20 Results were: normal--routine follow-up in 12 months There is a  FH of breast cancer in her mat aunt, genetic testing not indicated. There is no FH of ovarian cancer. The patient does do self-breast exams.  Tobacco use: The patient denies current or previous tobacco use. Alcohol use: none  No drug use. 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 Hx short term ileostomy/3 major surgeries  She does not get adequate calcium but does get Vitamin D in her diet.  Labs with PCP.  Has MS, doing well.   Past Medical History:  Diagnosis Date   Anemia 05/2019   iron infusions   Anxiety    Asthma    Cervical high risk human papillomavirus (HPV) DNA test positive 09/2015   Cholecystitis 2006   GALBLADDER REMOVED   Depression    Diverticulitis    Diverticulosis    Dysplastic nevus 08/24/2008   right  upper back lat, right upper back medial   Dysplastic nevus 04/21/2022   upper back medial to middlescapula, mild atypia   Fibroadenoma 2016   RIGHT BREAST   GERD (gastroesophageal reflux disease)    Head injury 06/25/2016   History of mammogram 11/2014   FIBROADENOMA   History of Papanicolaou smear of cervix 01/24/13; 10/15/15   -/-; -/+   Hypertension    Menorrhagia 02/01/2010   D/C HYSTEROSCOPY ENDO POLYP   MS (multiple sclerosis) (HCC)    Neuromuscular disorder (HCC)    Ovarian cyst 9/15; 2015-2016   LEFT - WENT TO [facility]; RIGHT   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   Sleep apnea    WEAR C PAP    Past Surgical History:  Procedure Laterality Date   BIOPSY  03/21/2019   Procedure: BIOPSY;  Surgeon: Lemar Lofty., MD;  Location: [facility] ENDOSCOPY;  Service: Gastroenterology;;   BREAST BIOPSY Right 2016   Fibroadenoma   CHOLECYSTECTOMY  2006   COLON RESECTION N/A 08/11/2019   Procedure: DIAGNOSTIC LAPAROSCOPY WITH WASH OUT, DRAIN PLACEMENT, AND LOOP ILEOSTOMY CREATION;  Surgeon: Romie Levee, MD;  Location: [facility] ORS;  Service: General;  Laterality: N/A;   COLONOSCOPY WITH PROPOFOL N/A 03/21/2019   Procedure: COLONOSCOPY WITH PROPOFOL;  Surgeon: Lemar Lofty., MD;  Location: [facility] ENDOSCOPY;  Service: Gastroenterology;  Laterality: N/A;  ultraslim  colonoscope    CYSTOSCOPY WITH INJECTION N/A 08/04/2019   Procedure: FIREFLY INJECTION;  Surgeon: Crist Fat, MD;  Location: [facility] ORS;  Service: Urology;  Laterality: N/A;   DILATION AND CURETTAGE, DIAGNOSTIC / THERAPEUTIC  02/01/2010   D/C HYSTEROSCOPY ENDO POLYP; [facility]   ENDOSCOPIC MUCOSAL RESECTION  03/21/2019   Procedure: ENDOSCOPIC MUCOSAL RESECTION;  Surgeon: Meridee Score Netty Starring., MD;  Location: [facility] ENDOSCOPY;  Service: Gastroenterology;;   ESOPHAGOGASTRODUODENOSCOPY (EGD) WITH PROPOFOL N/A 03/21/2019   Procedure: ESOPHAGOGASTRODUODENOSCOPY (EGD) WITH PROPOFOL;  Surgeon: Lemar Lofty., MD;   Location: [facility] ENDOSCOPY;  Service: Gastroenterology;  Laterality: N/A;   EYE SURGERY     Lasik   FLEXIBLE SIGMOIDOSCOPY  07/31/2016   Procedure: FLEXIBLE SIGMOIDOSCOPY;  Surgeon: Romie Levee, MD;  Location: [facility] ENDOSCOPY;  Service: Endoscopy;;   HEMOSTASIS CLIP PLACEMENT  03/21/2019   Procedure: HEMOSTASIS CLIP PLACEMENT;  Surgeon: Lemar Lofty., MD;  Location: [facility] ENDOSCOPY;  Service: Gastroenterology;;   HYSTEROSCOPY  02/01/2010   ENDO POLYP - [facility]   IR RADIOLOGIST EVAL & MGMT  11/16/2018   IR RADIOLOGIST EVAL & MGMT  09/06/2019   SCLEROTHERAPY  03/21/2019   Procedure: Susa Day;  Surgeon: Mansouraty, Netty Starring., MD;  Location: [facility] ENDOSCOPY;  Service: Gastroenterology;;    Family History  Problem Relation Age of Onset   Rheum arthritis Mother    Hypertension Mother    Thyroid disease Mother        HYPOTHYROIDISM   Lung cancer Mother 43   Heart disease Father    Diabetes Father    Hypertension Father    Breast cancer Maternal Aunt 63       has contact   Cancer Paternal Grandmother        MELANOMA OF SKIN   Cancer Cousin        KIDNEY-COUSIN   Colon cancer Neg Hx    Stomach cancer Neg Hx    Pancreatic cancer Neg Hx    Esophageal cancer Neg Hx    Inflammatory bowel disease Neg Hx    Rectal cancer Neg Hx    Colon polyps Neg Hx    Crohn's disease Neg Hx    Ulcerative colitis Neg Hx     Social History   Socioeconomic History   Marital status: Divorced    Spouse name: Not on file   Number of children: 0   Years of education: 14   Highest education level: Not on file  Occupational History    Employer: [employer]  Tobacco Use   Smoking status: Former    Current packs/day: 0.00    Types: Cigarettes    Start date: 2000    Quit date: 2010    Years since quitting: 14.8   Smokeless tobacco: Never  Vaping Use   Vaping status: Never Used  Substance and Sexual Activity   Alcohol use: No   Drug use: No   Sexual activity: Yes    Partners: Male    Birth  control/protection: I.U.D.    Comment: Mirena  Other Topics Concern   Not on file  Social History Narrative   Not on file   Social Determinants of Health   Financial Resource Strain: Not on file  Food Insecurity: Not on file  Transportation Needs: Not on file  Physical Activity: Not on file  Stress: Not on file  Social Connections: Not on file  Intimate Partner Violence: Not on file     Current Outpatient Medications:    acetaminophen (TYLENOL) 325 MG tablet, Take  2 tablets (650 mg total) by mouth every 6 (six) hours as needed for mild pain (or Fever >/= 101)., Disp: , Rfl:    busPIRone (BUSPAR) 15 MG tablet, TAKE 1 TABLET BY MOUTH TWICE DAILY, Disp: 180 tablet, Rfl: 0   celecoxib (CELEBREX) 200 MG capsule, TAKE 1 TO 2 CAPSULES BY MOUTH DAILY AS DIRECTED AS NEEDED, Disp: 90 capsule, Rfl: 0   chlorthalidone (HYGROTON) 25 MG tablet, TAKE ONE-HALF TABLET BY MOUTH  DAILY AS DIRECTED, Disp: 45 tablet, Rfl: 1   cholecalciferol (VITAMIN D3) 25 MCG (1000 UT) tablet, Take 1,000 Units by mouth daily., Disp: , Rfl:    cyclobenzaprine (FLEXERIL) 5 MG tablet, Take 1 tablet (5 mg total) by mouth at bedtime as needed. (Patient taking differently: Take 5 mg by mouth at bedtime as needed for muscle spasms.), Disp: 30 tablet, Rfl: 11   esomeprazole (NEXIUM) 40 MG capsule, Take 1 capsule (40 mg total) by mouth 2 (two) times daily. After 2 months, please take only once daily., Disp: 90 capsule, Rfl: 4   FLUoxetine (PROZAC) 10 MG capsule, Take 1 capsule (10 mg total) by mouth daily., Disp: 90 capsule, Rfl: 1   gabapentin (NEURONTIN) 300 MG capsule, One po qAM, one po qPM and two po qHS, Disp: 360 capsule, Rfl: 4   levonorgestrel (MIRENA) 20 MCG/DAY IUD, 1 each by Intrauterine route once., Disp: , Rfl:    lidocaine (LIDODERM) 5 %, Place 1 patch onto the skin daily. Remove & Discard patch within 12 hours or as directed by MD (Patient taking differently: Place 1 patch onto the skin as needed. Remove & Discard  patch within 12 hours or as directed by MD), Disp: 90 patch, Rfl: 0   LORazepam (ATIVAN) 1 MG tablet, Take 1 tablet (1 mg total) by mouth daily as needed. for anxiety, Disp: 90 tablet, Rfl: 0   mirabegron ER (MYRBETRIQ) 25 MG TB24 tablet, TAKE 1 TABLET(25 MG) BY MOUTH DAILY, Disp: 90 tablet, Rfl: 1   mometasone (ELOCON) 0.1 % cream, Apply 1 application topically daily as needed (Rash). Up to 5 times per week, Disp: 45 g, Rfl: 1   ondansetron (ZOFRAN-ODT) 4 MG disintegrating tablet, Take 4 mg by mouth every 6 (six) hours as needed for nausea or vomiting. , Disp: , Rfl:    Polyvinyl Alcohol-Povidone (REFRESH OP), Place 2 drops into both eyes 4 (four) times daily as needed (dryness)., Disp: , Rfl:    Probiotic Product (PROBIOTIC PO), Take 1 capsule by mouth daily., Disp: , Rfl:    Semaglutide-Weight Management (WEGOVY) 0.5 MG/0.5ML SOAJ, Inject 0.5 mg into the skin once a week., Disp: 2 mL, Rfl: 0   Semaglutide-Weight Management (WEGOVY) 1 MG/0.5ML SOAJ, Inject 1 mg into the skin once a week., Disp: 2 mL, Rfl: 1   Teriflunomide 14 MG TABS, Take 1 tablet (14 mg total) by mouth daily., Disp: 90 tablet, Rfl: 1   TRELEGY ELLIPTA 200-62.5-25 MCG/ACT AEPB, Inhale 1 puff into the lungs daily., Disp: , Rfl:   ROS:  Review of Systems  Constitutional:  Negative for fatigue, fever and unexpected weight change.  Respiratory:  Negative for cough, shortness of breath and wheezing.   Cardiovascular:  Negative for chest pain, palpitations and leg swelling.  Gastrointestinal:  Negative for blood in stool, constipation, diarrhea, nausea and vomiting.  Endocrine: Negative for cold intolerance, heat intolerance and polyuria.  Genitourinary:  Negative for dyspareunia, dysuria, flank pain, frequency, genital sores, hematuria, menstrual problem, pelvic pain, urgency, vaginal bleeding, vaginal discharge and  vaginal pain.  Musculoskeletal:  Positive for arthralgias. Negative for back pain, joint swelling and myalgias.   Skin:  Negative for rash.  Neurological:  Negative for dizziness, syncope, light-headedness, numbness and headaches.  Hematological:  Negative for adenopathy.  Psychiatric/Behavioral:  Positive for agitation. Negative for confusion, dysphoric mood, sleep disturbance and suicidal ideas. The patient is not nervous/anxious.      Objective: There were no vitals taken for this visit.   Physical Exam Constitutional:      Appearance: She is well-developed.  Genitourinary:     Vulva normal.     Genitourinary Comments: IUD STRINGS NOT SEEN IN CX OS     Right Labia: No rash, tenderness or lesions.    Left Labia: No tenderness, lesions or rash.    No vaginal discharge, erythema or tenderness.      Right Adnexa: not tender and no mass present.    Left Adnexa: not tender and no mass present.    No cervical motion tenderness, friability or polyp.     No IUD strings visualized.     Uterus is not enlarged or tender.  Breasts:    Right: No mass, nipple discharge, skin change or tenderness.     Left: No mass, nipple discharge, skin change or tenderness.  Neck:     Thyroid: No thyromegaly.  Cardiovascular:     Rate and Rhythm: Normal rate and regular rhythm.     Heart sounds: Normal heart sounds. No murmur heard. Pulmonary:     Effort: Pulmonary effort is normal.     Breath sounds: Normal breath sounds.  Abdominal:     Palpations: Abdomen is soft.     Tenderness: There is no abdominal tenderness. There is no guarding or rebound.  Musculoskeletal:        General: Normal range of motion.     Cervical back: Normal range of motion.  Lymphadenopathy:     Cervical: No cervical adenopathy.  Neurological:     General: No focal deficit present.     Mental Status: She is alert and oriented to person, place, and time.     Cranial Nerves: No cranial nerve deficit.  Skin:    General: Skin is warm and dry.  Psychiatric:        Mood and Affect: Mood normal.        Behavior: Behavior normal.         Thought Content: Thought content normal.        Judgment: Judgment normal.  Vitals reviewed.      Assessment/Plan: Encounter for annual routine gynecological examination  Cervical cancer screening - Plan: IGP, Aptima HPV,   Screening for HPV (human papillomavirus) - Plan: IGP, Aptima HPV,   History of abnormal cervical Pap smear - Plan: IGP, Aptima HPV,   Encounter for screening mammogram for malignant neoplasm of breast - Plan: MM 3D SCREEN BREAST BILATERAL; pt to schedule mammo  Encounter for routine checking of intrauterine contraceptive device (IUD); IUD placed 11/14/15; has 8 yr indication but pt having regular bleeding now, will replace IUD with next menses to see if bleeding improves.   Intrauterine contraceptive device threads lost, sequela; IUD placement confirmed on CT scan in past.     GYN counsel mammography screening, adequate intake of calcium and vitamin D, diet and exercise     F/U  No follow-ups on file.  Clessie Karras B. Hebe Merriwether, PA-C 01/21/2023 3:13 PM

## 2023-01-22 ENCOUNTER — Encounter: Payer: Self-pay | Admitting: Obstetrics and Gynecology

## 2023-01-22 ENCOUNTER — Ambulatory Visit (INDEPENDENT_AMBULATORY_CARE_PROVIDER_SITE_OTHER): Payer: BC Managed Care – PPO | Admitting: Obstetrics and Gynecology

## 2023-01-22 VITALS — BP 108/72 | Ht 64.0 in | Wt 262.0 lb

## 2023-01-22 DIAGNOSIS — Z01419 Encounter for gynecological examination (general) (routine) without abnormal findings: Secondary | ICD-10-CM

## 2023-01-22 DIAGNOSIS — Z8742 Personal history of other diseases of the female genital tract: Secondary | ICD-10-CM

## 2023-01-22 DIAGNOSIS — Z1211 Encounter for screening for malignant neoplasm of colon: Secondary | ICD-10-CM

## 2023-01-22 DIAGNOSIS — Z30431 Encounter for routine checking of intrauterine contraceptive device: Secondary | ICD-10-CM

## 2023-01-22 DIAGNOSIS — Z124 Encounter for screening for malignant neoplasm of cervix: Secondary | ICD-10-CM

## 2023-01-22 DIAGNOSIS — Z1151 Encounter for screening for human papillomavirus (HPV): Secondary | ICD-10-CM

## 2023-01-22 DIAGNOSIS — Z1231 Encounter for screening mammogram for malignant neoplasm of breast: Secondary | ICD-10-CM

## 2023-01-22 DIAGNOSIS — R928 Other abnormal and inconclusive findings on diagnostic imaging of breast: Secondary | ICD-10-CM | POA: Insufficient documentation

## 2023-01-22 NOTE — Patient Instructions (Addendum)
I value your feedback and you entrusting us with your care. If you get a [facility] patient survey, I would appreciate you taking the time to let us know about your experience today. Thank you!  [facility] ([place])--[phone]  

## 2023-01-26 ENCOUNTER — Encounter: Payer: Self-pay | Admitting: Neurology

## 2023-01-26 MED ORDER — MIRABEGRON ER 25 MG PO TB24: 25.0000 mg | ORAL_TABLET | Freq: Every day | ORAL | 0 refills | Status: DC

## 2023-01-26 NOTE — Telephone Encounter (Signed)
Last seen on 07/29/22 Follow up scheduled on 02/05/23

## 2023-01-29 LAB — IGP, APTIMA HPV: HPV Aptima: NEGATIVE

## 2023-01-29 LAB — HM PAP SMEAR: HPV Aptima: NEGATIVE

## 2023-01-30 ENCOUNTER — Other Ambulatory Visit: Payer: Self-pay | Admitting: Neurology

## 2023-01-30 DIAGNOSIS — G35 Multiple sclerosis: Secondary | ICD-10-CM

## 2023-02-02 NOTE — Telephone Encounter (Signed)
Last seen on 07/29/22 Follow up scheduled on 02/05/23

## 2023-02-05 ENCOUNTER — Ambulatory Visit: Payer: BC Managed Care – PPO | Admitting: Neurology

## 2023-02-05 ENCOUNTER — Encounter: Payer: Self-pay | Admitting: Neurology

## 2023-02-05 VITALS — BP 128/83 | HR 96 | Ht 66.0 in | Wt 263.5 lb

## 2023-02-05 DIAGNOSIS — G4719 Other hypersomnia: Secondary | ICD-10-CM | POA: Diagnosis not present

## 2023-02-05 DIAGNOSIS — G35 Multiple sclerosis: Secondary | ICD-10-CM

## 2023-02-05 DIAGNOSIS — Z79899 Other long term (current) drug therapy: Secondary | ICD-10-CM

## 2023-02-05 DIAGNOSIS — R208 Other disturbances of skin sensation: Secondary | ICD-10-CM

## 2023-02-05 DIAGNOSIS — R350 Frequency of micturition: Secondary | ICD-10-CM

## 2023-02-05 DIAGNOSIS — R269 Unspecified abnormalities of gait and mobility: Secondary | ICD-10-CM | POA: Diagnosis not present

## 2023-02-05 DIAGNOSIS — G4733 Obstructive sleep apnea (adult) (pediatric): Secondary | ICD-10-CM

## 2023-02-05 MED ORDER — MODAFINIL 200 MG PO TABS: 200.0000 mg | ORAL_TABLET | Freq: Every day | ORAL | 5 refills | Status: DC

## 2023-02-05 NOTE — Progress Notes (Signed)
[facility]  PATIENT: Sandra Bonilla DOB: 01-18-1976  REFERRING DOCTOR OR PCP:   Gillermo Murdoch SOURCE: patient and medical records  _________________________________   HISTORICAL  CHIEF COMPLAINT:  Chief Complaint  Patient presents with   Room 10    Pt is here Alone. Pt states that she is concerned about her gait issues. Pt states that she has been getting headaches recently on the right side of her head. Pt denies any light or sound sensitivity, as well as nausea and vomiting with her headaches.     HISTORY OF PRESENT ILLNESS:  Sandra Bonilla is a 47 y.o. 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     Update 02/05/2023 She feels her MS is mostly stable and she denies any exacerbation.   MRI 02/2020 showed no new lesions.   MRI 08/12/2022 showed no new lesions  She is on Aubagio and tolerates it well.     Gait is a little off due to both balance and strength.  Sometimes she feels she veers to the right.   No falls.  She uses the rails on stairs. Numbness has resolved. She has urinary frequency and is on Myrbetriq. The oxybutynin had stopped helping after a while ad she stopped.   No UTI's.    Vision is fine.         She has fatigue daily, seems worse since daylight savings change.  She notes insomnia.  She snores and occasionally wakes up with a snort.   She has gained some weight.     She uses the CPAP daily but does not note any decrease in EDS.    Tee home study was done 03/01/2023 and showed mild OSA with AHI=11.9.   She started CPAP due to the EDS.  She denies depression but has anxiety.  She is on Buspar and lorazepam for the anxiety.  She notes mild cognitive fog at times.   Mild reduced focus/attention  She has HA about once a week - no nausea or photophobia.    She is on computer much of the day.    EPWORTH SLEEPINESS SCALE  On a scale of 0 - 3 what is the chance of dozing:  Sitting and Reading:   2 Watching TV:    3 Sitting inactive in a public  place: 0 Passenger in car for one hour: 1 Lying down to rest in the afternoon: 3 Sitting and talking to someone: 0 Sitting quietly after lunch:  1 In a car, stopped in traffic:  0  Total (out of 24):   10/24   mild ESS    MS HIstory:       About  2006, she noted gait changes and her right foot was clumsy.    She had an MRI consistent with MS and was referred to Dr. Orlin Hilding [facility] who diagnosed her with MS.   She then saw Dr. Leone Brand and then Dr. Alberteen Spindle at [facility].  She was reluctant to take a medication until  2012 years ago when she started Copaxone.   She had skin reactions, she stopped after one year and switched to Aubagio (+/- late 2013).   She has tolerated it well.     Imaging MRI brain 03/06/2020:  Multiple T2/FLAIR hyperintense foci in the periventricular, juxtacortical and deep white matter of both hemispheres.  Additional foci are noted in the spinal cord adjacent to C2, right cerebral peduncle and right cerebellar hemisphere.  None of the foci appear to be acute and they  do not enhance.  Compared to the MRI dated 01/20/2018, there are no new lesions.  Small developmental venous anomaly in the right medial parietal lobe  MRI 07/2022 showed no new lesions   [facility] Sleep test 02/28/2022 showed mild OSA with pAHI3% of 11.9/h   REVIEW OF SYSTEMS: Constitutional: No fevers, chills, sweats, or change in appetite   She notes fatigue and poor sleep.    Eyes: No visual changes, double vision, eye pain Ear, nose and throat: No hearing loss, ear pain, nasal congestion, sore throat Cardiovascular: No chest pain, palpitations Respiratory:  No shortness of breath at rest or with exertion.   No wheezes.  Some snoring GastrointestinaI: No nausea, vomiting, diarrhea, abdominal pain, fecal incontinence Genitourinary:  No dysuria, urinary retention or frequency.  No nocturia. Musculoskeletal:  No neck pain, back pain Integumentary: No rash, pruritus, skin lesions Neurological: as  above Psychiatric: No depression at this time. Some anxiety Endocrine: No palpitations, diaphoresis, change in appetite, change in weigh or increased thirst Hematologic/Lymphatic:  No anemia, purpura, petechiae. Allergic/Immunologic: No itchy/runny eyes, nasal congestion, recent allergic reactions, rashes  ALLERGIES: Allergies  Allergen Reactions   Sulfamethoxazole-Trimethoprim Rash and Hives   Sulfa Antibiotics Other (See Comments) and Rash    HOME MEDICATIONS:  Current Outpatient Medications:    acetaminophen (TYLENOL) 325 MG tablet, Take 2 tablets (650 mg total) by mouth every 6 (six) hours as needed for mild pain (or Fever >/= 101)., Disp: , Rfl:    busPIRone (BUSPAR) 15 MG tablet, TAKE 1 TABLET BY MOUTH TWICE DAILY, Disp: 180 tablet, Rfl: 0   celecoxib (CELEBREX) 200 MG capsule, TAKE 1 TO 2 CAPSULES BY MOUTH DAILY AS DIRECTED AS NEEDED, Disp: 90 capsule, Rfl: 0   chlorthalidone (HYGROTON) 25 MG tablet, TAKE ONE-HALF TABLET BY MOUTH  DAILY AS DIRECTED, Disp: 45 tablet, Rfl: 1   cholecalciferol (VITAMIN D3) 25 MCG (1000 UT) tablet, Take 1,000 Units by mouth daily., Disp: , Rfl:    cyclobenzaprine (FLEXERIL) 5 MG tablet, Take 1 tablet (5 mg total) by mouth at bedtime as needed. (Patient taking differently: Take 5 mg by mouth at bedtime as needed for muscle spasms.), Disp: 30 tablet, Rfl: 11   esomeprazole (NEXIUM) 40 MG capsule, Take 1 capsule (40 mg total) by mouth 2 (two) times daily. After 2 months, please take only once daily., Disp: 90 capsule, Rfl: 4   FLUoxetine (PROZAC) 10 MG capsule, Take 1 capsule (10 mg total) by mouth daily., Disp: 90 capsule, Rfl: 1   fluticasone (FLONASE) 50 MCG/ACT nasal spray, Place into the nose., Disp: , Rfl:    gabapentin (NEURONTIN) 300 MG capsule, One po qAM, one po qPM and two po qHS, Disp: 360 capsule, Rfl: 4   levonorgestrel (MIRENA) 20 MCG/DAY IUD, 1 each by Intrauterine route once., Disp: , Rfl:    lidocaine (LIDODERM) 5 %, Place 1 patch onto  the skin daily. Remove & Discard patch within 12 hours or as directed by MD (Patient taking differently: Place 1 patch onto the skin as needed. Remove & Discard patch within 12 hours or as directed by MD), Disp: 90 patch, Rfl: 0   LORazepam (ATIVAN) 1 MG tablet, Take 1 tablet (1 mg total) by mouth daily as needed. for anxiety, Disp: 90 tablet, Rfl: 0   mirabegron ER (MYRBETRIQ) 25 MG TB24 tablet, Take 1 tablet (25 mg total) by mouth daily., Disp: 90 tablet, Rfl: 0   modafinil (PROVIGIL) 200 MG tablet, Take 1 tablet (200 mg total) by mouth  daily., Disp: 30 tablet, Rfl: 5   mometasone (ELOCON) 0.1 % cream, Apply 1 application topically daily as needed (Rash). Up to 5 times per week, Disp: 45 g, Rfl: 1   ondansetron (ZOFRAN-ODT) 4 MG disintegrating tablet, Take 4 mg by mouth every 6 (six) hours as needed for nausea or vomiting. , Disp: , Rfl:    Polyvinyl Alcohol-Povidone (REFRESH OP), Place 2 drops into both eyes 4 (four) times daily as needed (dryness)., Disp: , Rfl:    Probiotic Product (PROBIOTIC PO), Take 1 capsule by mouth daily., Disp: , Rfl:    Semaglutide-Weight Management (WEGOVY) 1 MG/0.5ML SOAJ, Inject 1 mg into the skin once a week., Disp: 2 mL, Rfl: 1   Teriflunomide 14 MG TABS, TAKE 1 TABLET BY MOUTH DAILY, Disp: 30 tablet, Rfl: 1   TRELEGY ELLIPTA 200-62.5-25 MCG/ACT AEPB, Inhale 1 puff into the lungs daily., Disp: , Rfl:   PAST MEDICAL HISTORY: 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   GERD (gastroesophageal reflux disease)    Head injury 06/25/2016   History of  mammogram 11/2014   FIBROADENOMA   History of Papanicolaou smear of cervix 01/24/13; 10/15/15   -/-; -/+   Hypertension    Menorrhagia 02/01/2010   D/C HYSTEROSCOPY ENDO POLYP   MS (multiple sclerosis) (HCC)    Neuromuscular disorder (HCC)    Ovarian cyst 9/15; 2015-2016   LEFT - WENT TO [place]; RIGHT   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   Sleep apnea    WEAR C PAP    PAST SURGICAL HISTORY: Past Surgical History:  Procedure Laterality Date   BIOPSY  03/21/2019   Procedure: BIOPSY;  Surgeon: Lemar Lofty., MD;  Location: [facility] ENDOSCOPY;  Service: Gastroenterology;;   BREAST BIOPSY Right 2016   Fibroadenoma   CHOLECYSTECTOMY  2006   COLON RESECTION N/A 08/11/2019   Procedure: DIAGNOSTIC LAPAROSCOPY WITH WASH OUT, DRAIN PLACEMENT, AND LOOP ILEOSTOMY CREATION;  Surgeon: Romie Levee, MD;  Location: [facility] ORS;  Service: General;  Laterality: N/A;   COLONOSCOPY WITH PROPOFOL N/A 03/21/2019   Procedure: COLONOSCOPY WITH PROPOFOL;  Surgeon: Lemar Lofty., MD;  Location: [facility] ENDOSCOPY;  Service: Gastroenterology;  Laterality: N/A;  ultraslim colonoscope    CYSTOSCOPY WITH INJECTION N/A 08/04/2019   Procedure: FIREFLY INJECTION;  Surgeon: Crist Fat, MD;  Location: [facility] ORS;  Service: Urology;  Laterality: N/A;   DILATION AND CURETTAGE, DIAGNOSTIC / THERAPEUTIC  02/01/2010   D/C HYSTEROSCOPY ENDO POLYP; [facility]   ENDOSCOPIC MUCOSAL RESECTION  03/21/2019   Procedure: ENDOSCOPIC MUCOSAL RESECTION;  Surgeon: Meridee Score Netty Starring., MD;  Location: [facility] ENDOSCOPY;  Service: Gastroenterology;;   ESOPHAGOGASTRODUODENOSCOPY (EGD) WITH PROPOFOL N/A 03/21/2019   Procedure: ESOPHAGOGASTRODUODENOSCOPY (EGD) WITH PROPOFOL;  Surgeon: Lemar Lofty., MD;  Location: [facility] ENDOSCOPY;  Service: Gastroenterology;  Laterality: N/A;   EYE SURGERY     Lasik   FLEXIBLE SIGMOIDOSCOPY  07/31/2016   Procedure: FLEXIBLE SIGMOIDOSCOPY;  Surgeon: Romie Levee, MD;  Location: [facility]  ENDOSCOPY;  Service: Endoscopy;;  HEMOSTASIS CLIP PLACEMENT  03/21/2019   Procedure: HEMOSTASIS CLIP PLACEMENT;  Surgeon: Lemar Lofty., MD;  Location: [facility] ENDOSCOPY;  Service: Gastroenterology;;   HYSTEROSCOPY  02/01/2010   ENDO POLYP - [facility]   IR RADIOLOGIST EVAL & MGMT  11/16/2018   IR RADIOLOGIST EVAL & MGMT  09/06/2019   SCLEROTHERAPY  03/21/2019   Procedure: Susa Day;  Surgeon: Mansouraty, Netty Starring., MD;  Location: [facility] ENDOSCOPY;  Service: Gastroenterology;;    FAMILY HISTORY: Family History  Problem Relation Age of Onset   Rheum arthritis Mother    Hypertension Mother    Thyroid disease Mother        HYPOTHYROIDISM   Lung cancer Mother 70   Heart disease Father    Diabetes Father    Hypertension Father    Breast cancer Maternal Aunt 22       has contact   Cancer Paternal Grandmother        MELANOMA OF SKIN   Cancer Cousin        KIDNEY-COUSIN   Colon cancer Neg Hx    Stomach cancer Neg Hx    Pancreatic cancer Neg Hx    Esophageal cancer Neg Hx    Inflammatory bowel disease Neg Hx    Rectal cancer Neg Hx    Colon polyps Neg Hx    Crohn's disease Neg Hx    Ulcerative colitis Neg Hx    Multiple sclerosis Neg Hx     SOCIAL HISTORY:  Social History   Socioeconomic History   Marital status: Divorced    Spouse name: Not on file   Number of children: 0   Years of education: 14   Highest education level: Not on file  Occupational History    Employer: [employer]  Tobacco Use   Smoking status: Former    Current packs/day: 0.00    Types: Cigarettes    Start date: 2000    Quit date: 2010    Years since quitting: 14.8   Smokeless tobacco: Never  Vaping Use   Vaping status: Never Used  Substance and Sexual Activity   Alcohol use: No   Drug use: No   Sexual activity: Yes    Partners: Male    Birth control/protection: I.U.D.    Comment: Mirena  Other Topics Concern   Not on file  Social History Narrative   Not on file   Social Determinants of  Health   Financial Resource Strain: Not on file  Food Insecurity: Not on file  Transportation Needs: Not on file  Physical Activity: Not on file  Stress: Not on file  Social Connections: Not on file  Intimate Partner Violence: Not on file     PHYSICAL EXAM  Vitals:   02/05/23 1606  Weight: 263 lb 8 oz (119.5 kg)  Height: 5\' 6"  (1.676 m)     Body mass index is 42.53 kg/m.   General: The patient is well-developed and well-nourished and in no acute distress.     Neurologic Exam  Mental status: The patient is alert and oriented x 3 at the time of the examination. The patient has apparent normal recent and remote memory, with an apparently normal attention span and concentration ability.   Speech is normal.  Cranial nerves: Extraocular muscles are normal.  Facial strength and sensation are normal.   Trapezius is strong  Hearing is normal   Motor:  Muscle bulk is normal.   Muscle tone and strength is normal in the arms and legs exept 4+/5 right  APB strength  Other:   Currently does not have a right Tinel's and Phalen's signs as did last visit.  Sensory: She has intact sensation to touch and vibration in the arms and legs  Coordination: Cerebellar testing reveals good finger-nose-finger and heel-to-shin bilaterally.  Gait and station: Station is normal.    Gait is normal.  Tandem gait is mildly wide.  Romberg is negative.  .  Reflexes: Deep tendon reflexes are symmetric and normal bilaterally in arms and legs.    No clonus or spread.        DIAGNOSTIC DATA (LABS, IMAGING, TESTING) - I reviewed patient records, labs, notes, testing and imaging myself where available.  Lab Results  Component Value Date   WBC 8.7 07/29/2022   HGB 13.8 07/29/2022   HCT 42.0 07/29/2022   MCV 82 07/29/2022   PLT 333 07/29/2022      Component Value Date/Time   NA 139 10/08/2022 0913   K 3.8 10/08/2022 0913   CL 99 10/08/2022 0913   CO2 24 10/08/2022 0913   GLUCOSE 88 10/08/2022 0913    GLUCOSE 110 (H) 12/06/2019 1653   BUN 17 10/08/2022 0913   CREATININE 1.35 (H) 10/08/2022 0913   CALCIUM 9.6 10/08/2022 0913   PROT 7.0 10/08/2022 0913   ALBUMIN 4.1 10/08/2022 0913   AST 27 10/08/2022 0913   ALT 31 10/08/2022 0913   ALKPHOS 161 (H) 10/08/2022 0913   BILITOT 0.8 10/08/2022 0913   GFRNONAA 105 02/29/2020 0810   GFRAA 121 02/29/2020 0810       ASSESSMENT AND PLAN  Relapsing remitting multiple sclerosis (HCC)  High risk medication use  Gait disturbance  Excessive daytime sleepiness  Dysesthesia  Urinary frequency  OSA on CPAP   1.    Continue Aubagio.    LFTs were fine 09/2022  2.    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  Continue  Myrbetriq for bladder.      Continue vit D supplements.  3.    Continue to be active and exercise as tolerated. 4.    Continue CPAP.  Trial of modafinil for OSA/EDS and MS fatigue.  If not better, consider a stimulant.   5.   She will return to see [place] in 6 months or sooner if there are new or worsening neurologic symptoms.     Caryssa Elzey A. Epimenio Foot, MD, PhD 02/05/2023, 4:25 PM Certified in Neurology, Clinical Neurophysiology, Sleep Medicine, Pain Medicine and Neuroimaging  [facility] [address], [address] [place], [place] [address] [phone]

## 2023-02-09 ENCOUNTER — Telehealth: Payer: Self-pay | Admitting: *Deleted

## 2023-02-09 ENCOUNTER — Encounter: Payer: Self-pay | Admitting: Neurology

## 2023-02-09 ENCOUNTER — Other Ambulatory Visit (HOSPITAL_COMMUNITY): Payer: Self-pay

## 2023-02-09 NOTE — Telephone Encounter (Signed)
Patient said [facility] told her PA is needed for Modafinil.

## 2023-02-10 ENCOUNTER — Telehealth: Payer: Self-pay

## 2023-02-10 ENCOUNTER — Other Ambulatory Visit: Payer: Self-pay | Admitting: Internal Medicine

## 2023-02-10 ENCOUNTER — Other Ambulatory Visit (HOSPITAL_COMMUNITY): Payer: Self-pay

## 2023-02-10 ENCOUNTER — Other Ambulatory Visit: Payer: Self-pay | Admitting: Neurology

## 2023-02-10 DIAGNOSIS — F419 Anxiety disorder, unspecified: Secondary | ICD-10-CM

## 2023-02-10 NOTE — Telephone Encounter (Signed)
Pharmacy Patient Advocate Encounter   Received notification from Physician's Office that prior authorization for Modafinil 200MG  tablets is required/requested.   Insurance verification completed.   The patient is insured through [facility] .   Per test claim: PA required; PA submitted to above mentioned insurance via CoverMyMeds Key/confirmation #/EOC [id number] Status is pending

## 2023-02-10 NOTE — Telephone Encounter (Signed)
The Prior Authorization for Modafinil 200MG  tablets has been APPROVED from 02/10/2023 to 08/10/2023.    Patient informed via my chart of the above.

## 2023-02-10 NOTE — Telephone Encounter (Signed)
Patient informed via my chart.

## 2023-02-10 NOTE — Telephone Encounter (Signed)
Last seen on 02/05/23 Follow up scheduled on 09/02/23

## 2023-02-10 NOTE — Telephone Encounter (Signed)
Pharmacy Patient Advocate Encounter  Received notification from [facility] that Prior Authorization for Modafinil 200MG  tablets has been APPROVED from 02/10/2023 to 08/10/2023. Ran test claim, Copay is $10.00. This test claim was processed through [facility]- copay amounts may vary at other pharmacies due to pharmacy/plan contracts, or as the patient moves through the different stages of their insurance plan.   PA #/Case ID/Reference #: PA Case ID #: [id number]

## 2023-02-10 NOTE — Telephone Encounter (Signed)
Phone room: Let pt know that we have our pa team working on it Thanks,  [employer]

## 2023-02-11 ENCOUNTER — Ambulatory Visit
Admission: RE | Admit: 2023-02-11 | Discharge: 2023-02-11 | Disposition: A | Discharge status: Home / Self Care | Payer: BC Managed Care – PPO | Source: Ambulatory Visit | Attending: Obstetrics and Gynecology | Admitting: Obstetrics and Gynecology

## 2023-02-11 DIAGNOSIS — R928 Other abnormal and inconclusive findings on diagnostic imaging of breast: Secondary | ICD-10-CM | POA: Diagnosis present

## 2023-02-11 DIAGNOSIS — Z1231 Encounter for screening mammogram for malignant neoplasm of breast: Secondary | ICD-10-CM | POA: Insufficient documentation

## 2023-02-12 ENCOUNTER — Encounter: Payer: Self-pay | Admitting: Obstetrics and Gynecology

## 2023-02-17 ENCOUNTER — Other Ambulatory Visit: Payer: Self-pay | Admitting: Internal Medicine

## 2023-02-20 ENCOUNTER — Other Ambulatory Visit: Payer: BC Managed Care – PPO

## 2023-02-20 DIAGNOSIS — R7303 Prediabetes: Secondary | ICD-10-CM

## 2023-02-21 LAB — COMPREHENSIVE METABOLIC PANEL
ALT: 18 [IU]/L (ref 0–32)
AST: 22 [IU]/L (ref 0–40)
Albumin: 3.8 g/dL — ABNORMAL LOW (ref 3.9–4.9)
Alkaline Phosphatase: 125 [IU]/L — ABNORMAL HIGH (ref 44–121)
BUN/Creatinine Ratio: 10 (ref 9–23)
BUN: 13 mg/dL (ref 6–24)
Bilirubin Total: 0.4 mg/dL (ref 0.0–1.2)
CO2: 26 mmol/L (ref 20–29)
Calcium: 9.2 mg/dL (ref 8.7–10.2)
Chloride: 100 mmol/L (ref 96–106)
Creatinine, Ser: 1.28 mg/dL — ABNORMAL HIGH (ref 0.57–1.00)
Globulin, Total: 2.4 g/dL (ref 1.5–4.5)
Glucose: 84 mg/dL (ref 70–99)
Potassium: 3.9 mmol/L (ref 3.5–5.2)
Sodium: 141 mmol/L (ref 134–144)
Total Protein: 6.2 g/dL (ref 6.0–8.5)
eGFR: 51 mL/min/{1.73_m2} — ABNORMAL LOW (ref >59)

## 2023-02-21 LAB — LIPID PANEL
Chol/HDL Ratio: 2.6 ratio (ref 0.0–4.4)
Cholesterol, Total: 157 mg/dL (ref 100–199)
HDL: 60 mg/dL (ref >39)
LDL Chol Calc (NIH): 77 mg/dL (ref 0–99)
Triglycerides: 110 mg/dL (ref 0–149)
VLDL Cholesterol Cal: 20 mg/dL (ref 5–40)

## 2023-02-21 LAB — HEMOGLOBIN A1C
Est. average glucose Bld gHb Est-mCnc: 123 mg/dL
Hgb A1c MFr Bld: 5.9 % — ABNORMAL HIGH (ref 4.8–5.6)

## 2023-02-24 ENCOUNTER — Encounter: Payer: Self-pay | Admitting: Internal Medicine

## 2023-02-24 ENCOUNTER — Ambulatory Visit: Payer: BC Managed Care – PPO | Admitting: Internal Medicine

## 2023-02-24 DIAGNOSIS — F419 Anxiety disorder, unspecified: Secondary | ICD-10-CM | POA: Diagnosis not present

## 2023-02-24 DIAGNOSIS — R7303 Prediabetes: Secondary | ICD-10-CM

## 2023-02-24 DIAGNOSIS — Z0130 Encounter for examination of blood pressure without abnormal findings: Secondary | ICD-10-CM

## 2023-02-24 DIAGNOSIS — N1831 Chronic kidney disease, stage 3a: Secondary | ICD-10-CM

## 2023-02-24 MED ORDER — TIRZEPATIDE-WEIGHT MANAGEMENT 2.5 MG/0.5ML ~~LOC~~ SOLN: 2.5000 mg | SUBCUTANEOUS | 1 refills | Status: DC

## 2023-02-24 NOTE — Progress Notes (Signed)
Established Patient Office Visit  Subjective:  Patient ID: Sandra Bonilla, female    DOB: [birth year]  Age: 47 y.o. MRN: [id number]  Chief Complaint  Patient presents with   Follow-up    2 month follow up, discuss lab results.    No new complaints, here for lab review and medication refills. LDL and TC well controlled on lab review. Triglycerides also satisfactory but A1c higher although in the prediabetic range. Mood stable on current dose of buspar. Failed to lose weight as unable to fill Wegovy due to [employer].  No other concerns at this time.   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   GERD (gastroesophageal reflux disease)    Head injury 06/25/2016   History of mammogram 11/2014   FIBROADENOMA   History of Papanicolaou smear of cervix 01/24/13; 10/15/15   -/-; -/+   Hypertension    Menorrhagia 02/01/2010   D/C HYSTEROSCOPY ENDO POLYP   MS (multiple sclerosis) (HCC)    Neuromuscular disorder (HCC)    Ovarian cyst 9/15; 2015-2016   LEFT - WENT TO [place]; RIGHT   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   Sleep apnea    WEAR C PAP    Past Surgical History:  Procedure Laterality Date   BIOPSY  03/21/2019   Procedure: BIOPSY;  Surgeon: Lemar Lofty., MD;  Location: [facility] ENDOSCOPY;  Service: Gastroenterology;;   BREAST BIOPSY Right 2016   Fibroadenoma   CHOLECYSTECTOMY  2006   COLON RESECTION N/A 08/11/2019   Procedure: DIAGNOSTIC LAPAROSCOPY WITH WASH OUT, DRAIN PLACEMENT, AND LOOP ILEOSTOMY CREATION;  Surgeon: Romie Levee, MD;  Location: [facility] ORS;  Service: General;  Laterality: N/A;   COLONOSCOPY WITH PROPOFOL N/A 03/21/2019   Procedure: COLONOSCOPY WITH PROPOFOL;  Surgeon: Lemar Lofty., MD;  Location: [facility] ENDOSCOPY;  Service: Gastroenterology;  Laterality: N/A;  ultraslim colonoscope    CYSTOSCOPY WITH INJECTION N/A 08/04/2019   Procedure: FIREFLY INJECTION;  Surgeon: Crist Fat, MD;  Location: [facility] ORS;  Service: Urology;  Laterality: N/A;   DILATION AND CURETTAGE, DIAGNOSTIC / THERAPEUTIC  02/01/2010   D/C HYSTEROSCOPY ENDO POLYP; [facility]   ENDOSCOPIC MUCOSAL RESECTION  03/21/2019   Procedure: ENDOSCOPIC MUCOSAL RESECTION;  Surgeon: Meridee Score Netty Starring., MD;  Location: [facility] ENDOSCOPY;  Service: Gastroenterology;;   ESOPHAGOGASTRODUODENOSCOPY (EGD) WITH PROPOFOL N/A 03/21/2019   Procedure: ESOPHAGOGASTRODUODENOSCOPY (EGD) WITH PROPOFOL;  Surgeon: Lemar Lofty., MD;  Location: [facility] ENDOSCOPY;  Service: Gastroenterology;  Laterality: N/A;   EYE SURGERY     Lasik   FLEXIBLE SIGMOIDOSCOPY  07/31/2016   Procedure: FLEXIBLE SIGMOIDOSCOPY;  Surgeon: Romie Levee, MD;  Location: [facility] ENDOSCOPY;  Service: Endoscopy;;   HEMOSTASIS CLIP PLACEMENT  03/21/2019   Procedure: HEMOSTASIS CLIP PLACEMENT;  Surgeon: Lemar Lofty., MD;  Location: [facility] ENDOSCOPY;  Service: Gastroenterology;;   HYSTEROSCOPY  02/01/2010   ENDO POLYP - [facility]   IR RADIOLOGIST EVAL & MGMT  11/16/2018   IR RADIOLOGIST EVAL & MGMT  09/06/2019   SCLEROTHERAPY  03/21/2019  Procedure: SCLEROTHERAPY;  Surgeon: Mansouraty, Netty Starring., MD;  Location: [facility] ENDOSCOPY;  Service: Gastroenterology;;    Social History   Socioeconomic History   Marital status: Divorced    Spouse name: Not on file   Number of children: 0   Years of education: 14   Highest education level: Not on file  Occupational History    Employer: [employer]  Tobacco Use   Smoking status: Former    Current packs/day: 0.00    Types: Cigarettes    Start  date: 2000    Quit date: 2010    Years since quitting: 14.9   Smokeless tobacco: Never  Vaping Use   Vaping status: Never Used  Substance and Sexual Activity   Alcohol use: No   Drug use: No   Sexual activity: Yes    Partners: Male    Birth control/protection: I.U.D.    Comment: Mirena  Other Topics Concern   Not on file  Social History Narrative   Not on file   Social Determinants of Health   Financial Resource Strain: Not on file  Food Insecurity: Not on file  Transportation Needs: Not on file  Physical Activity: Not on file  Stress: Not on file  Social Connections: Not on file  Intimate Partner Violence: Not on file    Family History  Problem Relation Age of Onset   Rheum arthritis Mother    Hypertension Mother    Thyroid disease Mother        HYPOTHYROIDISM   Lung cancer Mother 54   Heart disease Father    Diabetes Father    Hypertension Father    Breast cancer Maternal Aunt 13       has contact   Cancer Paternal Grandmother        MELANOMA OF SKIN   Cancer Cousin        KIDNEY-COUSIN   Colon cancer Neg Hx    Stomach cancer Neg Hx    Pancreatic cancer Neg Hx    Esophageal cancer Neg Hx    Inflammatory bowel disease Neg Hx    Rectal cancer Neg Hx    Colon polyps Neg Hx    Crohn'Lilymae Swiech disease Neg Hx    Ulcerative colitis Neg Hx    Multiple sclerosis Neg Hx     Allergies  Allergen Reactions   Sulfamethoxazole-Trimethoprim Rash and Hives   Sulfa Antibiotics Other (See Comments) and Rash    Outpatient Medications Prior to Visit  Medication Sig Note   acetaminophen (TYLENOL) 325 MG tablet Take 2 tablets (650 mg total) by mouth every 6 (six) hours as needed for mild pain (or Fever >/= 101).    busPIRone (BUSPAR) 15 MG tablet TAKE 1 TABLET BY MOUTH TWICE DAILY    chlorthalidone (HYGROTON) 25 MG tablet TAKE ONE-HALF TABLET BY MOUTH  DAILY AS DIRECTED    cholecalciferol (VITAMIN D3) 25 MCG (1000 UT) tablet Take 1,000 Units by mouth daily.    cyclobenzaprine  (FLEXERIL) 5 MG tablet Take 1 tablet (5 mg total) by mouth at bedtime as needed. (Patient taking differently: Take 5 mg by mouth at bedtime as needed for muscle spasms.)    esomeprazole (NEXIUM) 40 MG capsule Take 1 capsule (40 mg total) by mouth 2 (two) times daily. After 2 months, please take only once daily.    FLUoxetine (PROZAC) 10 MG capsule Take 1 capsule (10 mg total) by mouth daily.    fluticasone (FLONASE) 50 MCG/ACT nasal spray Place into the nose.  gabapentin (NEURONTIN) 300 MG capsule TAKE 1 CAPSULE BY MOUTH IN THE  MORNING , 1 CAPSULE IN THE  EVENING AND 2 CAPSULES AT  BEDTIME    levonorgestrel (MIRENA) 20 MCG/DAY IUD 1 each by Intrauterine route once.    lidocaine (LIDODERM) 5 % Place 1 patch onto the skin daily. Remove & Discard patch within 12 hours or as directed by MD (Patient taking differently: Place 1 patch onto the skin as needed. Remove & Discard patch within 12 hours or as directed by MD)    LORazepam (ATIVAN) 1 MG tablet TAKE 1 TABLET BY MOUTH DAILY AS  NEEDED FOR ANXIETY    mirabegron ER (MYRBETRIQ) 25 MG TB24 tablet Take 1 tablet (25 mg total) by mouth daily.    modafinil (PROVIGIL) 200 MG tablet Take 1 tablet (200 mg total) by mouth daily.    ondansetron (ZOFRAN-ODT) 4 MG disintegrating tablet Take 4 mg by mouth every 6 (six) hours as needed for nausea or vomiting.     Polyvinyl Alcohol-Povidone (REFRESH OP) Place 2 drops into both eyes 4 (four) times daily as needed (dryness).    Probiotic Product (PROBIOTIC PO) Take 1 capsule by mouth daily.    Teriflunomide 14 MG TABS TAKE 1 TABLET BY MOUTH DAILY    TRELEGY ELLIPTA 200-62.5-25 MCG/ACT AEPB Inhale 1 puff into the lungs daily.    [DISCONTINUED] celecoxib (CELEBREX) 200 MG capsule TAKE 1 TO 2 CAPSULES BY MOUTH DAILY AS DIRECTED AS NEEDED 02/24/2023: CKD   mometasone (ELOCON) 0.1 % cream Apply 1 application topically daily as needed (Rash). Up to 5 times per week (Patient not taking: Reported on 02/24/2023)    No  facility-administered medications prior to visit.    Review of Systems  Constitutional:  Negative for malaise/fatigue and weight loss.  HENT: Negative.    Eyes: Negative.   Respiratory: Negative.    Cardiovascular: Negative.   Gastrointestinal: Negative.   Genitourinary: Negative.   Skin: Negative.   Neurological: Negative.   Endo/Heme/Allergies: Negative.   Psychiatric/Behavioral:  The patient does not have insomnia (daytime somnolence).        Objective:   BP 126/84   Pulse 82   Ht 5\' 6"  (1.676 m)   Wt 265 lb (120.2 kg)   SpO2 97%   BMI 42.77 kg/m   Vitals:   02/24/23 1028  BP: 126/84  Pulse: 82  Height: 5\' 6"  (1.676 m)  Weight: 265 lb (120.2 kg)  SpO2: 97%  BMI (Calculated): 42.79    Physical Exam Vitals reviewed.  Constitutional:      General: She is not in acute distress.    Appearance: She is obese.  HENT:     Head: Normocephalic.     Nose: Nose normal.     Mouth/Throat:     Mouth: Mucous membranes are moist.  Eyes:     Extraocular Movements: Extraocular movements intact.     Pupils: Pupils are equal, round, and reactive to light.  Cardiovascular:     Rate and Rhythm: Normal rate and regular rhythm.     Heart sounds: No murmur heard. Pulmonary:     Effort: Pulmonary effort is normal.     Breath sounds: No rhonchi or rales.  Abdominal:     General: Abdomen is flat.     Palpations: There is no hepatomegaly, splenomegaly or mass.  Musculoskeletal:        General: Normal range of motion.     Cervical back: Normal range of motion. No tenderness.  Skin:  General: Skin is warm and dry.  Neurological:     General: No focal deficit present.     Mental Status: She is alert and oriented to person, place, and time.     Cranial Nerves: No cranial nerve deficit.     Motor: No weakness.  Psychiatric:        Mood and Affect: Mood normal.        Behavior: Behavior normal.      No results found for any visits on 02/24/23.  Recent Results (from the  past 2160 hour(Neyland Pettengill))  IGP, Aptima HPV     Status: None   Collection Time: 01/22/23  8:52 AM  Result Value Ref Range   Interpretation NILM     Comment: NEGATIVE FOR INTRAEPITHELIAL LESION OR MALIGNANCY.   Category NIL     Comment: Negative for Intraepithelial Lesion   Adequacy ENDO     Comment: Satisfactory for evaluation. Endocervical and/or squamous metaplastic cells (endocervical component) are present.    Clinician Provided ICD10 Comment     Comment: Z12.4 Z11.51 Z87.42    Amended report: Comment     Comment: This is an amended report due to a technical error. There is a change in the diagnosis.    Performed by: Comment     Comment: Sharman Cheek, Cytotechnologist (ASCP)   QC reviewed by: Comment     CommentLeia Alf, Cytotechnologist (ASCP)   Note: Comment     Comment: The Pap smear is a screening test designed to aid in the detection of premalignant and malignant conditions of the uterine cervix.  It is not a diagnostic procedure and should not be used as the sole means of detecting cervical cancer.  Both false-positive and false-negative reports do occur.    Test Methodology Comment     Comment: This liquid based ThinPrep(R) pap test was screened with the use of an image guided system.    HPV Aptima Negative Negative    Comment: This nucleic acid amplification test detects fourteen high-risk HPV types (16,18,31,33,35,39,45,51,52,56,58,59,66,68) without differentiation.   HM PAP SMEAR     Status: None   Collection Time: 01/29/23  8:29 AM  Result Value Ref Range   HM Pap smear NILM     Comment: ABSTRACTED BY HIM   HPV Aptima NEG     Comment: ABSTRACTED BY HIM  Hemoglobin A1c     Status: Abnormal   Collection Time: 02/20/23 10:46 AM  Result Value Ref Range   Hgb A1c MFr Bld 5.9 (H) 4.8 - 5.6 %    Comment:          Prediabetes: 5.7 - 6.4          Diabetes: >6.4          Glycemic control for adults with diabetes: <7.0    Est. average glucose Bld gHb  Est-mCnc 123 mg/dL  Lipid panel     Status: None   Collection Time: 02/20/23 10:46 AM  Result Value Ref Range   Cholesterol, Total 157 100 - 199 mg/dL   Triglycerides 528 0 - 149 mg/dL   HDL 60 >41 mg/dL   VLDL Cholesterol Cal 20 5 - 40 mg/dL   LDL Chol Calc (NIH) 77 0 - 99 mg/dL   Chol/HDL Ratio 2.6 0.0 - 4.4 ratio    Comment:  T. Chol/HDL Ratio                                             Men  Women                               1/2 Avg.Risk  3.4    3.3                                   Avg.Risk  5.0    4.4                                2X Avg.Risk  9.6    7.1                                3X Avg.Risk 23.4   11.0   Comprehensive metabolic panel     Status: Abnormal   Collection Time: 02/20/23 10:46 AM  Result Value Ref Range   Glucose 84 70 - 99 mg/dL   BUN 13 6 - 24 mg/dL   Creatinine, Ser 4.16 (H) 0.57 - 1.00 mg/dL   eGFR 51 (L) >60 YT/KZS/0.10   BUN/Creatinine Ratio 10 9 - 23   Sodium 141 134 - 144 mmol/L   Potassium 3.9 3.5 - 5.2 mmol/L   Chloride 100 96 - 106 mmol/L   CO2 26 20 - 29 mmol/L   Calcium 9.2 8.7 - 10.2 mg/dL   Total Protein 6.2 6.0 - 8.5 g/dL   Albumin 3.8 (L) 3.9 - 4.9 g/dL   Globulin, Total 2.4 1.5 - 4.5 g/dL   Bilirubin Total 0.4 0.0 - 1.2 mg/dL   Alkaline Phosphatase 125 (H) 44 - 121 IU/L   AST 22 0 - 40 IU/L   ALT 18 0 - 32 IU/L      Assessment & Plan:  As per problem list. Replace Wegovy with Zepbound. Problem List Items Addressed This Visit       Genitourinary   CKD stage 3a, GFR 45-59 ml/min (HCC)     Other   Anxiety   Morbid obesity (HCC) - Primary   Other Visit Diagnoses     Prediabetes           Return in about 2 months (around 04/26/2023) for Weight management.   Total time spent: 20 minutes  Luna Fuse, MD  02/24/2023   This document may have been prepared by [facility] Voice Recognition software and as such may include unintentional dictation errors.

## 2023-02-25 ENCOUNTER — Other Ambulatory Visit: Payer: Self-pay

## 2023-03-04 ENCOUNTER — Encounter: Payer: Self-pay | Admitting: Cardiology

## 2023-03-04 ENCOUNTER — Ambulatory Visit: Payer: BC Managed Care – PPO | Admitting: Cardiology

## 2023-03-04 VITALS — BP 120/88 | HR 92 | Ht 66.0 in | Wt 261.2 lb

## 2023-03-04 DIAGNOSIS — M545 Low back pain, unspecified: Secondary | ICD-10-CM | POA: Diagnosis not present

## 2023-03-04 DIAGNOSIS — Z0130 Encounter for examination of blood pressure without abnormal findings: Secondary | ICD-10-CM

## 2023-03-04 LAB — POCT URINALYSIS DIPSTICK
Bilirubin, UA: NEGATIVE
Blood, UA: NEGATIVE
Glucose, UA: NEGATIVE
Ketones, UA: NEGATIVE
Leukocytes, UA: NEGATIVE
Nitrite, UA: NEGATIVE
Protein, UA: NEGATIVE
Spec Grav, UA: 1.015 (ref 1.010–1.025)
Urobilinogen, UA: 0.2 U/dL
pH, UA: 6 (ref 5.0–8.0)

## 2023-03-04 MED ORDER — NITROFURANTOIN MONOHYD MACRO 100 MG PO CAPS: 100.0000 mg | ORAL_CAPSULE | Freq: Two times a day (BID) | ORAL | 0 refills | Status: AC

## 2023-03-04 NOTE — Progress Notes (Signed)
Established Patient Office Visit  Subjective:  Patient ID: Sandra Bonilla, female    DOB: 10-18-1975  Age: 47 y.o. MRN: [id number]  Chief Complaint  Patient presents with   Acute Visit    Low back pain (flank pain)    Patient in office for an acute visit. Patient complaining of low back, flank pain. Patient states pain started on Sunday, left plank pain only, Denies dysuria, increased frequency.  No history of kidney stones. Urine today clean, will send for culture. Will send in Macrobid.   Flank Pain This is a new problem. The current episode started in the past 7 days. The problem occurs constantly. The problem is unchanged. The pain is present in the thoracic spine. The quality of the pain is described as aching. The pain does not radiate. The pain is at a severity of 4/10. The pain is mild. Pertinent negatives include no abdominal pain, chest pain, dysuria, headaches or pelvic pain. Risk factors include obesity. She has tried nothing for the symptoms. The treatment provided no relief.    No other concerns at this time.   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   GERD (gastroesophageal reflux disease)    Head injury 06/25/2016   History of mammogram 11/2014   FIBROADENOMA   History of Papanicolaou smear of cervix 01/24/13; 10/15/15   -/-; -/+   Hypertension    Menorrhagia 02/01/2010   D/C HYSTEROSCOPY ENDO POLYP   MS (multiple sclerosis) (HCC)    Neuromuscular disorder (HCC)    Ovarian cyst 9/15; 2015-2016   LEFT - WENT TO [facility]; RIGHT   Pericolonic abscess  due to diverticulitis 2020   Sleep apnea    WEAR C PAP    Past Surgical History:  Procedure Laterality Date   BIOPSY  03/21/2019   Procedure: BIOPSY;  Surgeon: Lemar Lofty., MD;  Location: [facility] ENDOSCOPY;  Service: Gastroenterology;;   BREAST BIOPSY Right 2016   Fibroadenoma   CHOLECYSTECTOMY  2006   COLON RESECTION N/A 08/11/2019   Procedure: DIAGNOSTIC LAPAROSCOPY WITH WASH OUT, DRAIN PLACEMENT, AND LOOP ILEOSTOMY CREATION;  Surgeon: Romie Levee, MD;  Location: [facility] ORS;  Service: General;  Laterality: N/A;   COLONOSCOPY WITH PROPOFOL N/A 03/21/2019   Procedure: COLONOSCOPY WITH PROPOFOL;  Surgeon: Lemar Lofty., MD;  Location: [facility] ENDOSCOPY;  Service: Gastroenterology;  Laterality: N/A;  ultraslim colonoscope    CYSTOSCOPY WITH INJECTION N/A 08/04/2019   Procedure: FIREFLY INJECTION;  Surgeon: Crist Fat, MD;  Location: [facility] ORS;  Service: Urology;  Laterality: N/A;   DILATION AND CURETTAGE, DIAGNOSTIC / THERAPEUTIC  02/01/2010   D/C HYSTEROSCOPY ENDO POLYP; [facility]   ENDOSCOPIC MUCOSAL RESECTION  03/21/2019   Procedure: ENDOSCOPIC MUCOSAL RESECTION;  Surgeon: Meridee Score Netty Starring., MD;  Location: [facility] ENDOSCOPY;  Service: Gastroenterology;;   ESOPHAGOGASTRODUODENOSCOPY (EGD) WITH PROPOFOL N/A 03/21/2019   Procedure: ESOPHAGOGASTRODUODENOSCOPY (EGD) WITH PROPOFOL;  Surgeon: Lemar Lofty., MD;  Location: [facility] ENDOSCOPY;  Service: Gastroenterology;  Laterality: N/A;   EYE SURGERY  Lasik   FLEXIBLE SIGMOIDOSCOPY  07/31/2016   Procedure: FLEXIBLE SIGMOIDOSCOPY;  Surgeon: Romie Levee, MD;  Location: [facility] ENDOSCOPY;  Service: Endoscopy;;   HEMOSTASIS CLIP PLACEMENT  03/21/2019   Procedure: HEMOSTASIS CLIP PLACEMENT;  Surgeon: Lemar Lofty., MD;  Location: [facility] ENDOSCOPY;  Service: Gastroenterology;;   HYSTEROSCOPY  02/01/2010   ENDO POLYP - [facility]   IR RADIOLOGIST EVAL & MGMT  11/16/2018   IR RADIOLOGIST EVAL & MGMT  09/06/2019   SCLEROTHERAPY   03/21/2019   Procedure: SCLEROTHERAPY;  Surgeon: Mansouraty, Netty Starring., MD;  Location: [facility] ENDOSCOPY;  Service: Gastroenterology;;    Social History   Socioeconomic History   Marital status: Divorced    Spouse name: Not on file   Number of children: 0   Years of education: 14   Highest education level: Not on file  Occupational History    Employer: [employer]  Tobacco Use   Smoking status: Former    Current packs/day: 0.00    Types: Cigarettes    Start date: 2000    Quit date: 2010    Years since quitting: 14.9   Smokeless tobacco: Never  Vaping Use   Vaping status: Never Used  Substance and Sexual Activity   Alcohol use: No   Drug use: No   Sexual activity: Yes    Partners: Male    Birth control/protection: I.U.D.    Comment: Mirena  Other Topics Concern   Not on file  Social History Narrative   Not on file   Social Determinants of Health   Financial Resource Strain: Not on file  Food Insecurity: Not on file  Transportation Needs: Not on file  Physical Activity: Not on file  Stress: Not on file  Social Connections: Not on file  Intimate Partner Violence: Not on file    Family History  Problem Relation Age of Onset   Rheum arthritis Mother    Hypertension Mother    Thyroid disease Mother        HYPOTHYROIDISM   Lung cancer Mother 76   Heart disease Father    Diabetes Father    Hypertension Father    Breast cancer Maternal Aunt 75       has contact   Cancer Paternal Grandmother        MELANOMA OF SKIN   Cancer Cousin        KIDNEY-COUSIN   Colon cancer Neg Hx    Stomach cancer Neg Hx    Pancreatic cancer Neg Hx    Esophageal cancer Neg Hx    Inflammatory bowel disease Neg Hx    Rectal cancer Neg Hx    Colon polyps Neg Hx    Crohn's disease Neg Hx    Ulcerative colitis Neg Hx    Multiple sclerosis Neg Hx     Allergies  Allergen Reactions   Sulfamethoxazole-Trimethoprim Rash and Hives   Sulfa Antibiotics Other (See Comments) and Rash     Outpatient Medications Prior to Visit  Medication Sig Note   acetaminophen (TYLENOL) 325 MG tablet Take 2 tablets (650 mg total) by mouth every 6 (six) hours as needed for mild pain (or Fever >/= 101).    busPIRone (BUSPAR) 15 MG tablet TAKE 1 TABLET BY MOUTH TWICE DAILY    chlorthalidone (HYGROTON) 25 MG tablet TAKE ONE-HALF TABLET BY MOUTH  DAILY AS DIRECTED    cholecalciferol (VITAMIN D3) 25 MCG (1000 UT) tablet Take 1,000 Units by mouth daily.    cyclobenzaprine (FLEXERIL) 5 MG tablet Take 1  tablet (5 mg total) by mouth at bedtime as needed. (Patient taking differently: Take 5 mg by mouth at bedtime as needed for muscle spasms.)    esomeprazole (NEXIUM) 40 MG capsule Take 1 capsule (40 mg total) by mouth 2 (two) times daily. After 2 months, please take only once daily.    FLUoxetine (PROZAC) 10 MG capsule Take 1 capsule (10 mg total) by mouth daily.    fluticasone (FLONASE) 50 MCG/ACT nasal spray Place into the nose.    gabapentin (NEURONTIN) 300 MG capsule TAKE 1 CAPSULE BY MOUTH IN THE  MORNING , 1 CAPSULE IN THE  EVENING AND 2 CAPSULES AT  BEDTIME    levonorgestrel (MIRENA) 20 MCG/DAY IUD 1 each by Intrauterine route once.    lidocaine (LIDODERM) 5 % Place 1 patch onto the skin daily. Remove & Discard patch within 12 hours or as directed by MD (Patient taking differently: Place 1 patch onto the skin as needed. Remove & Discard patch within 12 hours or as directed by MD)    LORazepam (ATIVAN) 1 MG tablet TAKE 1 TABLET BY MOUTH DAILY AS  NEEDED FOR ANXIETY    mirabegron ER (MYRBETRIQ) 25 MG TB24 tablet Take 1 tablet (25 mg total) by mouth daily.    ondansetron (ZOFRAN-ODT) 4 MG disintegrating tablet Take 4 mg by mouth every 6 (six) hours as needed for nausea or vomiting.     Polyvinyl Alcohol-Povidone (REFRESH OP) Place 2 drops into both eyes 4 (four) times daily as needed (dryness).    Probiotic Product (PROBIOTIC PO) Take 1 capsule by mouth daily.    Teriflunomide 14 MG TABS TAKE 1  TABLET BY MOUTH DAILY    TRELEGY ELLIPTA 200-62.5-25 MCG/ACT AEPB Inhale 1 puff into the lungs daily.    modafinil (PROVIGIL) 200 MG tablet Take 1 tablet (200 mg total) by mouth daily.    mometasone (ELOCON) 0.1 % cream Apply 1 application topically daily as needed (Rash). Up to 5 times per week (Patient not taking: Reported on 02/24/2023)    tirzepatide (ZEPBOUND) 2.5 MG/0.5ML injection vial Inject 2.5 mg into the skin once a week. (Patient not taking: Reported on 03/04/2023) 03/04/2023: Could never get from pharmacy. Out of stock.   No facility-administered medications prior to visit.    Review of Systems  Constitutional: Negative.   HENT: Negative.    Eyes: Negative.   Respiratory: Negative.  Negative for shortness of breath.   Cardiovascular: Negative.  Negative for chest pain.  Gastrointestinal: Negative.  Negative for abdominal pain, constipation and diarrhea.  Genitourinary:  Positive for flank pain. Negative for dysuria and pelvic pain.  Musculoskeletal:  Negative for joint pain and myalgias.  Skin: Negative.   Neurological: Negative.  Negative for dizziness and headaches.  Endo/Heme/Allergies: Negative.   All other systems reviewed and are negative.      Objective:   BP 120/88   Pulse 92   Ht 5\' 6"  (1.676 m)   Wt 261 lb 3.2 oz (118.5 kg)   SpO2 98%   BMI 42.16 kg/m   Vitals:   03/04/23 1107  BP: 120/88  Pulse: 92  Height: 5\' 6"  (1.676 m)  Weight: 261 lb 3.2 oz (118.5 kg)  SpO2: 98%  BMI (Calculated): 42.18    Physical Exam Vitals and nursing note reviewed.  Constitutional:      Appearance: Normal appearance. She is normal weight.  HENT:     Head: Normocephalic and atraumatic.     Nose: Nose normal.  Mouth/Throat:     Mouth: Mucous membranes are moist.  Eyes:     Extraocular Movements: Extraocular movements intact.     Conjunctiva/sclera: Conjunctivae normal.     Pupils: Pupils are equal, round, and reactive to light.  Cardiovascular:     Rate and  Rhythm: Normal rate and regular rhythm.     Pulses: Normal pulses.     Heart sounds: Normal heart sounds.  Pulmonary:     Effort: Pulmonary effort is normal.     Breath sounds: Normal breath sounds.  Abdominal:     General: Abdomen is flat. Bowel sounds are normal.     Palpations: Abdomen is soft.  Musculoskeletal:        General: Normal range of motion.     Cervical back: Normal range of motion.  Skin:    General: Skin is warm and dry.  Neurological:     General: No focal deficit present.     Mental Status: She is alert and oriented to person, place, and time.  Psychiatric:        Mood and Affect: Mood normal.        Behavior: Behavior normal.        Thought Content: Thought content normal.        Judgment: Judgment normal.      Results for orders placed or performed in visit on 03/04/23  POCT Urinalysis Dipstick (81002)  Result Value Ref Range   Color, UA yellow    Clarity, UA clear    Glucose, UA Negative Negative   Bilirubin, UA negative    Ketones, UA negative    Spec Grav, UA 1.015 1.010 - 1.025   Blood, UA negative    pH, UA 6.0 5.0 - 8.0   Protein, UA Negative Negative   Urobilinogen, UA 0.2 0.2 or 1.0 E.U./dL   Nitrite, UA negative    Leukocytes, UA Negative Negative   Appearance clear    Odor none     Recent Results (from the past 2160 hour(s))  IGP, Aptima HPV     Status: None   Collection Time: 01/22/23  8:52 AM  Result Value Ref Range   Interpretation NILM     Comment: NEGATIVE FOR INTRAEPITHELIAL LESION OR MALIGNANCY.   Category NIL     Comment: Negative for Intraepithelial Lesion   Adequacy ENDO     Comment: Satisfactory for evaluation. Endocervical and/or squamous metaplastic cells (endocervical component) are present.    Clinician Provided ICD10 Comment     Comment: Z12.4 Z11.51 Z87.42    Amended report: Comment     Comment: This is an amended report due to a technical error. There is a change in the diagnosis.    Performed by:  Comment     Comment: Sharman Cheek, Cytotechnologist (ASCP)   QC reviewed by: Comment     CommentLeia Alf, Cytotechnologist (ASCP)   Note: Comment     Comment: The Pap smear is a screening test designed to aid in the detection of premalignant and malignant conditions of the uterine cervix.  It is not a diagnostic procedure and should not be used as the sole means of detecting cervical cancer.  Both false-positive and false-negative reports do occur.    Test Methodology Comment     Comment: This liquid based ThinPrep(R) pap test was screened with the use of an image guided system.    HPV Aptima Negative Negative    Comment: This nucleic acid amplification test detects fourteen high-risk HPV types (  16,18,31,33,35,39,45,51,52,56,58,59,66,68) without differentiation.   HM PAP SMEAR     Status: None   Collection Time: 01/29/23  8:29 AM  Result Value Ref Range   HM Pap smear NILM     Comment: ABSTRACTED BY HIM   HPV Aptima NEG     Comment: ABSTRACTED BY HIM  Hemoglobin A1c     Status: Abnormal   Collection Time: 02/20/23 10:46 AM  Result Value Ref Range   Hgb A1c MFr Bld 5.9 (H) 4.8 - 5.6 %    Comment:          Prediabetes: 5.7 - 6.4          Diabetes: >6.4          Glycemic control for adults with diabetes: <7.0    Est. average glucose Bld gHb Est-mCnc 123 mg/dL  Lipid panel     Status: None   Collection Time: 02/20/23 10:46 AM  Result Value Ref Range   Cholesterol, Total 157 100 - 199 mg/dL   Triglycerides 098 0 - 149 mg/dL   HDL 60 >11 mg/dL   VLDL Cholesterol Cal 20 5 - 40 mg/dL   LDL Chol Calc (NIH) 77 0 - 99 mg/dL   Chol/HDL Ratio 2.6 0.0 - 4.4 ratio    Comment:                                   T. Chol/HDL Ratio                                             Men  Women                               1/2 Avg.Risk  3.4    3.3                                   Avg.Risk  5.0    4.4                                2X Avg.Risk  9.6    7.1                                 3X Avg.Risk 23.4   11.0   Comprehensive metabolic panel     Status: Abnormal   Collection Time: 02/20/23 10:46 AM  Result Value Ref Range   Glucose 84 70 - 99 mg/dL   BUN 13 6 - 24 mg/dL   Creatinine, Ser 9.14 (H) 0.57 - 1.00 mg/dL   eGFR 51 (L) >78 GN/FAO/1.30   BUN/Creatinine Ratio 10 9 - 23   Sodium 141 134 - 144 mmol/L   Potassium 3.9 3.5 - 5.2 mmol/L   Chloride 100 96 - 106 mmol/L   CO2 26 20 - 29 mmol/L   Calcium 9.2 8.7 - 10.2 mg/dL   Total Protein 6.2 6.0 - 8.5 g/dL   Albumin 3.8 (L) 3.9 - 4.9 g/dL   Globulin, Total 2.4 1.5 - 4.5 g/dL   Bilirubin Total 0.4 0.0 - 1.2 mg/dL  Alkaline Phosphatase 125 (H) 44 - 121 IU/L   AST 22 0 - 40 IU/L   ALT 18 0 - 32 IU/L  POCT Urinalysis Dipstick (16109)     Status: Normal   Collection Time: 03/04/23 11:22 AM  Result Value Ref Range   Color, UA yellow    Clarity, UA clear    Glucose, UA Negative Negative   Bilirubin, UA negative    Ketones, UA negative    Spec Grav, UA 1.015 1.010 - 1.025   Blood, UA negative    pH, UA 6.0 5.0 - 8.0   Protein, UA Negative Negative   Urobilinogen, UA 0.2 0.2 or 1.0 E.U./dL   Nitrite, UA negative    Leukocytes, UA Negative Negative   Appearance clear    Odor none       Assessment & Plan:  Macrobid  Problem List Items Addressed This Visit       Other   Acute left-sided low back pain without sciatica - Primary   Relevant Orders   POCT Urinalysis Dipstick (60454) (Completed)   Culture, Urine    No follow-ups on file.   Total time spent: 25 minutes  [employer], NP  03/04/2023   This document may have been prepared by Dragon Voice Recognition software and as such may include unintentional dictation errors.

## 2023-03-06 LAB — URINE CULTURE

## 2023-03-20 ENCOUNTER — Other Ambulatory Visit: Payer: Self-pay | Admitting: Internal Medicine

## 2023-03-27 ENCOUNTER — Other Ambulatory Visit: Payer: Self-pay | Admitting: Neurology

## 2023-03-27 DIAGNOSIS — G35 Multiple sclerosis: Secondary | ICD-10-CM

## 2023-04-23 ENCOUNTER — Ambulatory Visit: Payer: BC Managed Care – PPO | Admitting: Dermatology

## 2023-04-25 ENCOUNTER — Other Ambulatory Visit: Payer: Self-pay | Admitting: Internal Medicine

## 2023-04-27 ENCOUNTER — Other Ambulatory Visit: Payer: Self-pay

## 2023-04-27 MED ORDER — MIRABEGRON ER 25 MG PO TB24: 25.0000 mg | ORAL_TABLET | Freq: Every day | ORAL | 0 refills | Status: DC

## 2023-04-28 ENCOUNTER — Ambulatory Visit: Payer: BC Managed Care – PPO | Admitting: Internal Medicine

## 2023-04-28 DIAGNOSIS — F419 Anxiety disorder, unspecified: Secondary | ICD-10-CM

## 2023-04-28 MED ORDER — LORAZEPAM 1 MG PO TABS: 1.0000 mg | ORAL_TABLET | Freq: Every day | ORAL | 0 refills | Status: DC | PRN

## 2023-04-28 NOTE — Progress Notes (Signed)
Established Patient Office Visit  Subjective:  Patient ID: Sandra Bonilla, female    DOB: February 13, 1976  Age: 48 y.o. MRN: [id number]  Chief Complaint  Patient presents with   Follow-up    2 month follow up     No new complaints, here for weight follow up and medication refills. Currently on colchicine for pneumonitis per pulmonary. Lost weight with dieting but still hasn't started exercising. Only started Zepbound last week due to insurance delay.     No other concerns at this time.   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   GERD (gastroesophageal reflux disease)    Head injury 06/25/2016   History of mammogram 11/2014   FIBROADENOMA   History of Papanicolaou smear of cervix 01/24/13; 10/15/15   -/-; -/+   Hypertension    Menorrhagia 02/01/2010   D/C HYSTEROSCOPY ENDO POLYP   MS (multiple sclerosis) (HCC)    Neuromuscular disorder (HCC)    Ovarian cyst 9/15; 2015-2016   LEFT - WENT TO [place]; RIGHT   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   Sleep apnea    WEAR C PAP    Past Surgical History:  Procedure Laterality Date   BIOPSY  03/21/2019   Procedure: BIOPSY;  Surgeon: Lemar Lofty., MD;  Location: [facility] ENDOSCOPY;  Service: Gastroenterology;;   BREAST BIOPSY Right 2016   Fibroadenoma   CHOLECYSTECTOMY  2006   COLON RESECTION N/A 08/11/2019   Procedure: DIAGNOSTIC LAPAROSCOPY WITH WASH OUT, DRAIN PLACEMENT, AND LOOP ILEOSTOMY CREATION;  Surgeon: Romie Levee, MD;  Location: [facility] ORS;  Service: General;  Laterality: N/A;   COLONOSCOPY  WITH PROPOFOL N/A 03/21/2019   Procedure: COLONOSCOPY WITH PROPOFOL;  Surgeon: Lemar Lofty., MD;  Location: [facility] ENDOSCOPY;  Service: Gastroenterology;  Laterality: N/A;  ultraslim colonoscope    CYSTOSCOPY WITH INJECTION N/A 08/04/2019   Procedure: FIREFLY INJECTION;  Surgeon: Crist Fat, MD;  Location: [facility] ORS;  Service: Urology;  Laterality: N/A;   DILATION AND CURETTAGE, DIAGNOSTIC / THERAPEUTIC  02/01/2010   D/C HYSTEROSCOPY ENDO POLYP; [facility]   ENDOSCOPIC MUCOSAL RESECTION  03/21/2019   Procedure: ENDOSCOPIC MUCOSAL RESECTION;  Surgeon: Meridee Score Netty Starring., MD;  Location: [facility] ENDOSCOPY;  Service: Gastroenterology;;   ESOPHAGOGASTRODUODENOSCOPY (EGD) WITH PROPOFOL N/A 03/21/2019   Procedure: ESOPHAGOGASTRODUODENOSCOPY (EGD) WITH PROPOFOL;  Surgeon: Lemar Lofty., MD;  Location: [facility] ENDOSCOPY;  Service: Gastroenterology;  Laterality: N/A;   EYE SURGERY     Lasik   FLEXIBLE SIGMOIDOSCOPY  07/31/2016   Procedure: FLEXIBLE SIGMOIDOSCOPY;  Surgeon: Romie Levee, MD;  Location: [facility] ENDOSCOPY;  Service: Endoscopy;;   HEMOSTASIS CLIP PLACEMENT  03/21/2019   Procedure: HEMOSTASIS CLIP PLACEMENT;  Surgeon: Lemar Lofty., MD;  Location: [facility] ENDOSCOPY;  Service: Gastroenterology;;   HYSTEROSCOPY  02/01/2010   ENDO POLYP - [facility]   IR RADIOLOGIST EVAL & MGMT  11/16/2018   IR RADIOLOGIST EVAL & MGMT  09/06/2019   SCLEROTHERAPY  03/21/2019   Procedure: Susa Day;  Surgeon: Mansouraty, Netty Starring., MD;  Location:  [facility] ENDOSCOPY;  Service: Gastroenterology;;    Social History   Socioeconomic History   Marital status: Divorced    Spouse name: Not on file   Number of children: 0   Years of education: 14   Highest education level: Not on file  Occupational History    Employer: [employer]  Tobacco Use   Smoking status: Former    Current packs/day: 0.00    Types: Cigarettes    Start date: 2000    Quit date: 2010    Years since quitting: 15.0   Smokeless  tobacco: Never  Vaping Use   Vaping status: Never Used  Substance and Sexual Activity   Alcohol use: No   Drug use: No   Sexual activity: Yes    Partners: Male    Birth control/protection: I.U.D.    Comment: Mirena  Other Topics Concern   Not on file  Social History Narrative   Not on file   Social Drivers of Health   Financial Resource Strain: Low Risk  (04/16/2023)   Received [facility]   Overall Financial Resource Strain (CARDIA)    Difficulty of Paying Living Expenses: Not hard at all  Food Insecurity: No Food Insecurity (04/16/2023)   [facility]   Hunger Vital Sign    Worried About Running Out of Food in the Last Year: Never true    Ran Out of Food in the Last Year: Never true  Transportation Needs: No Transportation Needs (04/16/2023)   Received from [facility] - Transportation    In the past 12 months, has lack of transportation kept you from medical appointments or from getting medications?: No    Lack of Transportation (Non-Medical): No  Physical Activity: Not on file  Stress: Not on file  Social Connections: Not on file  Intimate Partner Violence: Not on file    Family History  Problem Relation Age of Onset   Rheum arthritis Mother    Hypertension Mother    Thyroid disease Mother        HYPOTHYROIDISM   Lung cancer Mother 24   Heart disease Father    Diabetes Father    Hypertension Father    Breast cancer Maternal Aunt 12       has contact   Cancer Paternal Grandmother        MELANOMA OF SKIN   Cancer Cousin        KIDNEY-COUSIN   Colon cancer Neg Hx    Stomach cancer Neg Hx    Pancreatic cancer Neg Hx    Esophageal cancer Neg Hx    Inflammatory bowel disease Neg Hx    Rectal cancer Neg Hx    Colon polyps Neg Hx    Crohn'Sarahanne Novakowski disease Neg Hx    Ulcerative colitis Neg Hx    Multiple sclerosis Neg Hx     Allergies  Allergen Reactions   Sulfamethoxazole-Trimethoprim  Rash and Hives   Sulfa Antibiotics Other (See Comments) and Rash    Outpatient Medications Prior to Visit  Medication Sig   acetaminophen (TYLENOL) 325 MG tablet Take 2 tablets (650 mg total) by mouth every 6 (six) hours as needed for mild pain (or Fever >/= 101).   busPIRone (BUSPAR) 15 MG tablet TAKE 1 TABLET BY MOUTH TWICE DAILY   chlorthalidone (HYGROTON) 25 MG tablet TAKE ONE-HALF TABLET BY MOUTH  DAILY AS DIRECTED   cholecalciferol (VITAMIN D3) 25 MCG (1000 UT) tablet Take 1,000  Units by mouth daily.   colchicine 0.6 MG tablet Take 0.6 mg by mouth daily.   cyclobenzaprine (FLEXERIL) 5 MG tablet Take 1 tablet (5 mg total) by mouth at bedtime as needed. (Patient taking differently: Take 5 mg by mouth at bedtime as needed for muscle spasms.)   esomeprazole (NEXIUM) 40 MG capsule Take 1 capsule (40 mg total) by mouth 2 (two) times daily. After 2 months, please take only once daily.   FLUoxetine (PROZAC) 10 MG capsule Take 1 capsule (10 mg total) by mouth daily.   gabapentin (NEURONTIN) 300 MG capsule TAKE 1 CAPSULE BY MOUTH IN THE  MORNING , 1 CAPSULE IN THE  EVENING AND 2 CAPSULES AT  BEDTIME   levonorgestrel (MIRENA) 20 MCG/DAY IUD 1 each by Intrauterine route once.   lidocaine (LIDODERM) 5 % Place 1 patch onto the skin daily. Remove & Discard patch within 12 hours or as directed by MD (Patient taking differently: Place 1 patch onto the skin as needed. Remove & Discard patch within 12 hours or as directed by MD)   mirabegron ER (MYRBETRIQ) 25 MG TB24 tablet Take 1 tablet (25 mg total) by mouth daily.   modafinil (PROVIGIL) 200 MG tablet Take 1 tablet (200 mg total) by mouth daily.   ondansetron (ZOFRAN-ODT) 4 MG disintegrating tablet Take 4 mg by mouth every 6 (six) hours as needed for nausea or vomiting.    Polyvinyl Alcohol-Povidone (REFRESH OP) Place 2 drops into both eyes 4 (four) times daily as needed (dryness).   Probiotic Product (PROBIOTIC PO) Take 1 capsule by mouth daily.    Teriflunomide 14 MG TABS TAKE 1 TABLET BY MOUTH DAILY   TRELEGY ELLIPTA 200-62.5-25 MCG/ACT AEPB Inhale 1 puff into the lungs daily.   ZEPBOUND 2.5 MG/0.5ML Pen Inject 2.5 mg into the skin once a week.   [DISCONTINUED] LORazepam (ATIVAN) 1 MG tablet TAKE 1 TABLET BY MOUTH DAILY AS  NEEDED FOR ANXIETY   fluticasone (FLONASE) 50 MCG/ACT nasal spray Place into the nose.   mometasone (ELOCON) 0.1 % cream Apply 1 application topically daily as needed (Rash). Up to 5 times per week (Patient not taking: Reported on 04/28/2023)   No facility-administered medications prior to visit.    Review of Systems  Constitutional:  Positive for weight loss (9 lbs). Negative for malaise/fatigue.  HENT: Negative.    Eyes: Negative.   Respiratory: Negative.    Cardiovascular: Negative.   Gastrointestinal: Negative.   Genitourinary: Negative.   Skin: Negative.   Neurological: Negative.   Endo/Heme/Allergies: Negative.   Psychiatric/Behavioral:  The patient does not have insomnia (daytime somnolence).        Objective:   BP 132/74   Pulse 88   Temp 98.4 F (36.9 C)   Ht 5\' 6"  (1.676 m)   Wt 252 lb 9.6 oz (114.6 kg)   SpO2 98%   BMI 40.77 kg/m   Vitals:   04/28/23 1145  BP: 132/74  Pulse: 88  Temp: 98.4 F (36.9 C)  Height: 5\' 6"  (1.676 m)  Weight: 252 lb 9.6 oz (114.6 kg)  SpO2: 98%  BMI (Calculated): 40.79    Physical Exam Vitals reviewed.  Constitutional:      General: She is not in acute distress.    Appearance: She is obese.  HENT:     Head: Normocephalic.     Nose: Nose normal.     Mouth/Throat:     Mouth: Mucous membranes are moist.  Eyes:     Extraocular Movements:  Extraocular movements intact.     Pupils: Pupils are equal, round, and reactive to light.  Cardiovascular:     Rate and Rhythm: Normal rate and regular rhythm.     Heart sounds: No murmur heard. Pulmonary:     Effort: Pulmonary effort is normal.     Breath sounds: No rhonchi or rales.  Abdominal:      General: Abdomen is flat.     Palpations: There is no hepatomegaly, splenomegaly or mass.  Musculoskeletal:        General: Normal range of motion.     Cervical back: Normal range of motion. No tenderness.  Skin:    General: Skin is warm and dry.  Neurological:     General: No focal deficit present.     Mental Status: She is alert and oriented to person, place, and time.     Cranial Nerves: No cranial nerve deficit.     Motor: No weakness.  Psychiatric:        Mood and Affect: Mood normal.        Behavior: Behavior normal.      No results found for any visits on 04/28/23.      Assessment & Plan:  As per problem list  Problem List Items Addressed This Visit       Other   Anxiety   Relevant Medications   LORazepam (ATIVAN) 1 MG tablet   Morbid obesity (HCC) - Primary   Relevant Medications   ZEPBOUND 2.5 MG/0.5ML Pen    Return in about 7 weeks (around 06/16/2023) for Weight management.   Total time spent: 20 minutes  Luna Fuse, MD  04/28/2023   This document may have been prepared by [facility] Voice Recognition software and as such may include unintentional dictation errors.

## 2023-05-13 ENCOUNTER — Ambulatory Visit: Payer: BC Managed Care – PPO | Admitting: Dermatology

## 2023-05-13 DIAGNOSIS — L820 Inflamed seborrheic keratosis: Secondary | ICD-10-CM

## 2023-05-13 DIAGNOSIS — Z1283 Encounter for screening for malignant neoplasm of skin: Secondary | ICD-10-CM

## 2023-05-13 DIAGNOSIS — W908XXA Exposure to other nonionizing radiation, initial encounter: Secondary | ICD-10-CM

## 2023-05-13 DIAGNOSIS — L578 Other skin changes due to chronic exposure to nonionizing radiation: Secondary | ICD-10-CM | POA: Diagnosis not present

## 2023-05-13 DIAGNOSIS — D229 Melanocytic nevi, unspecified: Secondary | ICD-10-CM

## 2023-05-13 DIAGNOSIS — L853 Xerosis cutis: Secondary | ICD-10-CM

## 2023-05-13 DIAGNOSIS — Z86018 Personal history of other benign neoplasm: Secondary | ICD-10-CM

## 2023-05-13 DIAGNOSIS — L814 Other melanin hyperpigmentation: Secondary | ICD-10-CM | POA: Diagnosis not present

## 2023-05-13 DIAGNOSIS — D1801 Hemangioma of skin and subcutaneous tissue: Secondary | ICD-10-CM

## 2023-05-13 DIAGNOSIS — L918 Other hypertrophic disorders of the skin: Secondary | ICD-10-CM

## 2023-05-13 DIAGNOSIS — L821 Other seborrheic keratosis: Secondary | ICD-10-CM

## 2023-05-13 NOTE — Progress Notes (Signed)
Follow-Up Visit   Subjective  Sandra Bonilla is a 48 y.o. female who presents for the following: Skin Cancer Screening and Full Body Skin Exam  The patient presents for Total-Body Skin Exam (TBSE) for skin cancer screening and mole check. The patient has spots, moles and lesions to be evaluated, some may be new or changing. Scaly spot in scalp is bothersome.  The following portions of the chart were reviewed this encounter and updated as appropriate: medications, allergies, medical history  Review of Systems:  No other skin or systemic complaints except as noted in HPI or Assessment and Plan.  Objective  Well appearing patient in no apparent distress; mood and affect are within normal limits.  A full examination was performed including scalp, head, eyes, ears, nose, lips, neck, chest, axillae, abdomen, back, buttocks, bilateral upper extremities, bilateral lower extremities, hands, feet, fingers, toes, fingernails, and toenails. All findings within normal limits unless otherwise noted below.   Relevant physical exam findings are noted in the Assessment and Plan.  L parietal scalp Erythematous stuck-on, waxy papule  Assessment & Plan   SKIN CANCER SCREENING PERFORMED TODAY.  ACTINIC DAMAGE - Chronic condition, secondary to cumulative UV/sun exposure - diffuse scaly erythematous macules with underlying dyspigmentation - Recommend daily broad spectrum sunscreen SPF 30+ to sun-exposed areas, reapply every 2 hours as needed.  - Staying in the shade or wearing long sleeves, sun glasses (UVA+UVB protection) and wide brim hats (4-inch brim around the entire circumference of the hat) are also recommended for sun protection.  - Call for new or changing lesions.  LENTIGINES,  HEMANGIOMAS (including left lower abdomen) - Benign normal skin lesions - Benign-appearing - Call for any changes  SEBORRHEIC KERATOSIS - Stuck-on, waxy, tan-brown papules and/or plaques  - Benign-appearing -  Discussed benign etiology and prognosis. - Observe - Call for any changes  Acrochordons (Skin Tags) - Fleshy, skin-colored pedunculated papules at neck - Benign appearing.  - Observe. - If desired, they can be removed with an in office procedure that is not covered by insurance. - Please call the clinic if you notice any new or changing lesions.  Xerosis - diffuse xerotic patches, elbows, feet, and hands - recommend gentle, hydrating skin care - gentle skin care handout given - Sample of AmLactin Cream  MELANOCYTIC NEVI - Tan-brown and/or pink-flesh-colored symmetric macules and papules - Benign appearing on exam today - Observation - Call clinic for new or changing moles - Recommend daily use of broad spectrum spf 30+ sunscreen to sun-exposed areas.   History of Dysplastic Nevi - No evidence of recurrence today - Recommend regular full body skin exams - Recommend daily broad spectrum sunscreen SPF 30+ to sun-exposed areas, reapply every 2 hours as needed.  - Call if any new or changing lesions are noted between office visits  INFLAMED SEBORRHEIC KERATOSIS L parietal scalp Symptomatic, irritating, patient would like treated. Destruction of lesion - L parietal scalp  Destruction method: cryotherapy   Informed consent: discussed and consent obtained   Lesion destroyed using liquid nitrogen: Yes   Region frozen until ice ball extended beyond lesion: Yes   Outcome: patient tolerated procedure well with no complications   Post-procedure details: wound care instructions given   Additional details:  Prior to procedure, discussed risks of blister formation, small wound, skin dyspigmentation, or rare scar following cryotherapy. Recommend Vaseline ointment to treated areas while healing.  Return in about 1 year (around 05/12/2024) for TBSE, Hx Dysplastic Nevus.  I, Cherlyn Labella,  CMA, am acting as scribe for Willeen Niece, MD .   Documentation: I have reviewed the above  documentation for accuracy and completeness, and I agree with the above.  Willeen Niece, MD

## 2023-05-13 NOTE — Patient Instructions (Addendum)
Cryotherapy Aftercare  Wash gently with soap and water everyday.   Apply Vaseline and Band-Aid daily until healed.    Seborrheic Keratosis  What causes seborrheic keratoses? Seborrheic keratoses are harmless, common skin growths that first appear during adult life.  As time goes by, more growths appear.  Some people may develop a large number of them.  Seborrheic keratoses appear on both covered and uncovered body parts.  They are not caused by sunlight.  The tendency to develop seborrheic keratoses can be inherited.  They vary in color from skin-colored to gray, brown, or even black.  They can be either smooth or have a rough, warty surface.   Seborrheic keratoses are superficial and look as if they were stuck on the skin.  Under the microscope this type of keratosis looks like layers upon layers of skin.  That is why at times the top layer may seem to fall off, but the rest of the growth remains and re-grows.    Treatment Seborrheic keratoses do not need to be treated, but can easily be removed in the office.  Seborrheic keratoses often cause symptoms when they rub on clothing or jewelry.  Lesions can be in the way of shaving.  If they become inflamed, they can cause itching, soreness, or burning.  Removal of a seborrheic keratosis can be accomplished by freezing, burning, or surgery. If any spot bleeds, scabs, or grows rapidly, please return to have it checked, as these can be an indication of a skin cancer.   Melanoma ABCDEs  Melanoma is the most dangerous type of skin cancer, and is the leading cause of death from skin disease.  You are more likely to develop melanoma if you: Have light-colored skin, light-colored eyes, or red or blond hair Spend a lot of time in the sun Tan regularly, either outdoors or in a tanning bed Have had blistering sunburns, especially during childhood Have a close family member who has had a melanoma Have atypical moles or large birthmarks  Early detection  of melanoma is key since treatment is typically straightforward and cure rates are extremely high if we catch it early.   The first sign of melanoma is often a change in a mole or a new dark spot.  The ABCDE system is a way of remembering the signs of melanoma.  A for asymmetry:  The two halves do not match. B for border:  The edges of the growth are irregular. C for color:  A mixture of colors are present instead of an even brown color. D for diameter:  Melanomas are usually (but not always) greater than 6mm - the size of a pencil eraser. E for evolution:  The spot keeps changing in size, shape, and color.  Please check your skin once per month between visits. You can use a small mirror in front and a large mirror behind you to keep an eye on the back side or your body.   If you see any new or changing lesions before your next follow-up, please call to schedule a visit.  Please continue daily skin protection including broad spectrum sunscreen SPF 30+ to sun-exposed areas, reapplying every 2 hours as needed when you're outdoors.   Staying in the shade or wearing long sleeves, sun glasses (UVA+UVB protection) and wide brim hats (4-inch brim around the entire circumference of the hat) are also recommended for sun protection.    Due to recent changes in healthcare laws, you may see results of your pathology and/or  laboratory studies on MyChart before the doctors have had a chance to review them. We understand that in some cases there may be results that are confusing or concerning to you. Please understand that not all results are received at the same time and often the doctors may need to interpret multiple results in order to provide you with the best plan of care or course of treatment. Therefore, we ask that you please give [place] 2 business days to thoroughly review all your results before contacting the office for clarification. Should we see a critical lab result, you will be contacted  sooner.   If You Need Anything After Your Visit  If you have any questions or concerns for your doctor, please call our main line at [phone] and press option 4 to reach your doctor's medical assistant. If no one answers, please leave a voicemail as directed and we will return your call as soon as possible. Messages left after 4 pm will be answered the following business day.   You may also send [place] a message via MyChart. We typically respond to MyChart messages within 1-2 business days.  For prescription refills, please ask your pharmacy to contact our office. Our fax number is [phone].  If you have an urgent issue when the clinic is closed that cannot wait until the next business day, you can page your doctor at the number below.    Please note that while we do our best to be available for urgent issues outside of office hours, we are not available 24/7.   If you have an urgent issue and are unable to reach [place], you may choose to seek medical care at your doctor's office, retail clinic, urgent care center, or emergency room.  If you have a medical emergency, please immediately call 911 or go to the emergency department.  Pager Numbers  - Dr. Gwen Pounds: [phone]  - Dr. Roseanne Reno: [phone]  - Dr. Katrinka Blazing: [phone]   In the event of inclement weather, please call our main line at [phone] for an update on the status of any delays or closures.  Dermatology Medication Tips: Please keep the boxes that topical medications come in in order to help keep track of the instructions about where and how to use these. Pharmacies typically print the medication instructions only on the boxes and not directly on the medication tubes.   If your medication is too expensive, please contact our office at [phone] option 4 or send [place] a message through MyChart.   We are unable to tell what your co-pay for medications will be in advance as this is different depending on your  insurance coverage. However, we may be able to find a substitute medication at lower cost or fill out paperwork to get insurance to cover a needed medication.   If a prior authorization is required to get your medication covered by your insurance company, please allow [place] 1-2 business days to complete this process.  Drug prices often vary depending on where the prescription is filled and some pharmacies may offer cheaper prices.  The website www.goodrx.com contains coupons for medications through different pharmacies. The prices here do not account for what the cost may be with help from insurance (it may be cheaper with your insurance), but the website can give you the price if you did not use any insurance.  - You can print the associated coupon and take it with your prescription to the pharmacy.  - You may also stop by our  office during regular business hours and pick up a GoodRx coupon card.  - If you need your prescription sent electronically to a different pharmacy, notify our office through [facility] or by phone at [phone] option 4.     Si Usted Necesita Algo Despus de Su Visita  Tambin puede enviarnos un mensaje a travs de Clinical cytogeneticist. Por lo general respondemos a los mensajes de MyChart en el transcurso de 1 a 2 das hbiles.  Para renovar recetas, por favor pida a su farmacia que se ponga en contacto con nuestra oficina. Annie Sable de fax es [place] [phone].  Si tiene un asunto urgente cuando la clnica est cerrada y que no puede esperar hasta el siguiente da hbil, puede llamar/localizar a su doctor(a) al nmero que aparece a continuacin.   Por favor, tenga en cuenta que aunque hacemos todo lo posible para estar disponibles para asuntos urgentes fuera del horario de [place], no estamos disponibles las 24 horas del da, los 7 [address]  [address] de la [place].   Si tiene un problema urgente y no puede comunicarse con nosotros, puede optar por buscar atencin mdica  en el  consultorio de su doctor(a), en una clnica privada, en un centro de atencin urgente o en una sala de emergencias.  Si tiene Engineer, drilling, por favor llame inmediatamente al 911 o vaya a la sala de emergencias.  Nmeros de bper  - Dr. Gwen Pounds: [phone]  - Dra. Roseanne Reno: [phone]  - Dr. Katrinka Blazing: [phone]   En caso de inclemencias del tiempo, por favor llame a Lacy Duverney principal al [phone] para una actualizacin sobre el [place] de cualquier retraso o cierre.  Consejos para la medicacin en dermatologa: Por favor, guarde las cajas en las que vienen los medicamentos de uso tpico para ayudarle a seguir las instrucciones sobre dnde y cmo usarlos. Las farmacias generalmente imprimen las instrucciones del medicamento slo en las cajas y no directamente en los tubos del [place].   Si su medicamento es muy caro, por favor, pngase en contacto con Rolm Gala llamando al [phone] y presione la opcin 4 o envenos un mensaje a travs de Clinical cytogeneticist.   No podemos decirle cul ser su copago por los medicamentos por adelantado ya que esto es diferente dependiendo de la cobertura de su seguro. Sin embargo, es posible que podamos encontrar un medicamento sustituto a Audiological scientist un formulario para que el seguro cubra el medicamento que se considera necesario.   Si se requiere una autorizacin previa para que su compaa de seguros [place] su medicamento, por favor permtanos de 1 a 2 das hbiles para completar [address]  Los precios de los medicamentos varan con frecuencia dependiendo del Environmental consultant de dnde se surte la receta y alguna farmacias pueden ofrecer precios ms baratos.  El sitio web www.goodrx.com tiene cupones para medicamentos de Health and safety inspector. Los precios aqu no tienen en cuenta lo que podra costar con la ayuda del seguro (puede ser ms barato con su seguro), pero el sitio web puede darle el precio si no utiliz Tourist information centre manager.  -  Puede imprimir el cupn correspondiente y llevarlo con su receta a la farmacia.  - Tambin puede pasar por nuestra oficina durante el horario de atencin regular y Education officer, museum una tarjeta de cupones de GoodRx.  - Si necesita que su receta se enve electrnicamente a una farmacia diferente, informe a nuestra oficina a travs de MyChart de [place] o por telfono llamando al [phone] y presione la opcin 4.

## 2023-05-19 DIAGNOSIS — R0602 Shortness of breath: Secondary | ICD-10-CM | POA: Insufficient documentation

## 2023-05-27 ENCOUNTER — Other Ambulatory Visit: Payer: Self-pay | Admitting: Internal Medicine

## 2023-05-27 DIAGNOSIS — F419 Anxiety disorder, unspecified: Secondary | ICD-10-CM

## 2023-06-08 ENCOUNTER — Other Ambulatory Visit: Payer: Self-pay | Admitting: Neurology

## 2023-06-08 DIAGNOSIS — G35 Multiple sclerosis: Secondary | ICD-10-CM

## 2023-06-16 ENCOUNTER — Ambulatory Visit: Payer: BC Managed Care – PPO | Admitting: Internal Medicine

## 2023-06-16 ENCOUNTER — Encounter: Payer: Self-pay | Admitting: Internal Medicine

## 2023-06-16 VITALS — BP 131/82 | HR 85 | Temp 98.2°F | Ht 66.0 in | Wt 246.0 lb

## 2023-06-16 DIAGNOSIS — R7303 Prediabetes: Secondary | ICD-10-CM

## 2023-06-16 DIAGNOSIS — N1831 Chronic kidney disease, stage 3a: Secondary | ICD-10-CM

## 2023-06-16 DIAGNOSIS — F419 Anxiety disorder, unspecified: Secondary | ICD-10-CM | POA: Diagnosis not present

## 2023-06-16 DIAGNOSIS — I10 Essential (primary) hypertension: Secondary | ICD-10-CM | POA: Diagnosis not present

## 2023-06-16 MED ORDER — ZEPBOUND 5 MG/0.5ML ~~LOC~~ SOAJ: 5.0000 mg | SUBCUTANEOUS | 1 refills | Status: DC

## 2023-06-16 NOTE — Progress Notes (Signed)
 Established Patient Office Visit  Subjective:  Patient ID: Sandra Bonilla, female    DOB: 08/27/1975  Age: 48 y.o. MRN: [id number]  Chief Complaint  Patient presents with   Follow-up    7 week follow up  Weight management     No new complaints, here for weight management.     No other concerns at this time.   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   GERD (gastroesophageal reflux disease)    Head injury 06/25/2016   History of mammogram 11/2014   FIBROADENOMA   History of Papanicolaou smear of cervix 01/24/13; 10/15/15   -/-; -/+   Hypertension    Menorrhagia 02/01/2010   D/C HYSTEROSCOPY ENDO POLYP   MS (multiple sclerosis) (HCC)    Neuromuscular disorder (HCC)    Ovarian cyst 9/15; 2015-2016   LEFT - WENT TO [place]; RIGHT   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   Sleep apnea    WEAR C PAP    Past Surgical History:  Procedure Laterality Date   BIOPSY  03/21/2019   Procedure: BIOPSY;  Surgeon: Lemar Lofty., MD;  Location: [facility] ENDOSCOPY;  Service: Gastroenterology;;   BREAST BIOPSY Right 2016   Fibroadenoma   CHOLECYSTECTOMY  2006   COLON RESECTION N/A 08/11/2019   Procedure: DIAGNOSTIC LAPAROSCOPY WITH WASH OUT, DRAIN PLACEMENT, AND LOOP ILEOSTOMY CREATION;  Surgeon: Romie Levee, MD;  Location: [facility] ORS;  Service: General;  Laterality: N/A;   COLONOSCOPY WITH PROPOFOL N/A 03/21/2019   Procedure: COLONOSCOPY WITH PROPOFOL;  Surgeon: Lemar Lofty., MD;  Location: [facility] ENDOSCOPY;  Service: Gastroenterology;  Laterality:  N/A;  ultraslim colonoscope    CYSTOSCOPY WITH INJECTION N/A 08/04/2019   Procedure: FIREFLY INJECTION;  Surgeon: Crist Fat, MD;  Location: [facility] ORS;  Service: Urology;  Laterality: N/A;   DILATION AND CURETTAGE, DIAGNOSTIC / THERAPEUTIC  02/01/2010   D/C HYSTEROSCOPY ENDO POLYP; [facility]   ENDOSCOPIC MUCOSAL RESECTION  03/21/2019   Procedure: ENDOSCOPIC MUCOSAL RESECTION;  Surgeon: Meridee Score Netty Starring., MD;  Location: [facility] ENDOSCOPY;  Service: Gastroenterology;;   ESOPHAGOGASTRODUODENOSCOPY (EGD) WITH PROPOFOL N/A 03/21/2019   Procedure: ESOPHAGOGASTRODUODENOSCOPY (EGD) WITH PROPOFOL;  Surgeon: Lemar Lofty., MD;  Location: [facility] ENDOSCOPY;  Service: Gastroenterology;  Laterality: N/A;   EYE SURGERY     Lasik   FLEXIBLE SIGMOIDOSCOPY  07/31/2016   Procedure: FLEXIBLE SIGMOIDOSCOPY;  Surgeon: Romie Levee, MD;  Location: [facility] ENDOSCOPY;  Service: Endoscopy;;   HEMOSTASIS CLIP PLACEMENT  03/21/2019   Procedure: HEMOSTASIS CLIP PLACEMENT;  Surgeon: Lemar Lofty., MD;  Location: [facility] ENDOSCOPY;  Service: Gastroenterology;;   HYSTEROSCOPY  02/01/2010   ENDO POLYP - [facility]   IR RADIOLOGIST EVAL & MGMT  11/16/2018   IR RADIOLOGIST EVAL & MGMT  09/06/2019   SCLEROTHERAPY  03/21/2019   Procedure: Susa Day;  Surgeon: Mansouraty, Netty Starring., MD;  Location: [facility] ENDOSCOPY;  Service: Gastroenterology;;    Social History   Socioeconomic History   Marital status: Divorced    Spouse name: Not on  file   Number of children: 0   Years of education: 14   Highest education level: Not on file  Occupational History    Employer: [employer]  Tobacco Use   Smoking status: Former    Current packs/day: 0.00    Types: Cigarettes    Start date: 2000    Quit date: 2010    Years since quitting: 15.2   Smokeless tobacco: Never  Vaping Use   Vaping status: Never Used  Substance and Sexual Activity   Alcohol use: No   Drug use: No   Sexual activity: Yes    Partners: Male    Birth  control/protection: I.U.D.    Comment: Mirena  Other Topics Concern   Not on file  Social History Narrative   Not on file   Social Drivers of Health   Financial Resource Strain: Low Risk  (05/19/2023)   Received from [facility]   Overall Financial Resource Strain (CARDIA)    Difficulty of Paying Living Expenses: Not hard at all  Food Insecurity: No Food Insecurity (05/19/2023)   Received from [facility]   Hunger Vital Sign    Worried About Running Out of Food in the Last Year: Never true    Ran Out of Food in the Last Year: Never true  Transportation Needs: No Transportation Needs (05/19/2023)   Received from [facility] - Transportation    In the past 12 months, has lack of transportation kept you from medical appointments or from getting medications?: No    Lack of Transportation (Non-Medical): No  Physical Activity: Not on file  Stress: Not on file  Social Connections: Not on file  Intimate Partner Violence: Not on file    Family History  Problem Relation Age of Onset   Rheum arthritis Mother    Hypertension Mother    Thyroid disease Mother        HYPOTHYROIDISM   Lung cancer Mother 46   Heart disease Father    Diabetes Father    Hypertension Father    Breast cancer Maternal Aunt 60       has contact   Cancer Paternal Grandmother        MELANOMA OF SKIN   Cancer Cousin        KIDNEY-COUSIN   Colon cancer Neg Hx    Stomach cancer Neg Hx    Pancreatic cancer Neg Hx    Esophageal cancer Neg Hx    Inflammatory bowel disease Neg Hx    Rectal cancer Neg Hx    Colon polyps Neg Hx    Crohn'Jahkari Maclin disease Neg Hx    Ulcerative colitis Neg Hx    Multiple sclerosis Neg Hx     Allergies  Allergen Reactions   Sulfamethoxazole-Trimethoprim Rash and Hives   Sulfa Antibiotics Other (See Comments) and Rash    Outpatient Medications Prior to Visit  Medication Sig   acetaminophen (TYLENOL) 325 MG tablet Take 2  tablets (650 mg total) by mouth every 6 (six) hours as needed for mild pain (or Fever >/= 101).   busPIRone (BUSPAR) 15 MG tablet TAKE 1 TABLET BY MOUTH TWICE DAILY   chlorthalidone (HYGROTON) 25 MG tablet TAKE ONE-HALF TABLET BY MOUTH  DAILY AS DIRECTED   cholecalciferol (VITAMIN D3) 25 MCG (1000 UT) tablet Take 1,000 Units by mouth daily.   colchicine 0.6 MG tablet Take 0.6 mg by mouth daily.   cyclobenzaprine (FLEXERIL) 5 MG tablet Take 1 tablet (  5 mg total) by mouth at bedtime as needed. (Patient taking differently: Take 5 mg by mouth at bedtime as needed for muscle spasms.)   esomeprazole (NEXIUM) 40 MG capsule Take 1 capsule (40 mg total) by mouth 2 (two) times daily. After 2 months, please take only once daily.   FLUoxetine (PROZAC) 10 MG capsule TAKE 1 CAPSULE(10 MG) BY MOUTH DAILY   fluticasone (FLONASE) 50 MCG/ACT nasal spray Place into the nose.   gabapentin (NEURONTIN) 300 MG capsule TAKE 1 CAPSULE BY MOUTH IN THE  MORNING , 1 CAPSULE IN THE  EVENING AND 2 CAPSULES AT  BEDTIME   levonorgestrel (MIRENA) 20 MCG/DAY IUD 1 each by Intrauterine route once.   lidocaine (LIDODERM) 5 % Place 1 patch onto the skin daily. Remove & Discard patch within 12 hours or as directed by MD (Patient taking differently: Place 1 patch onto the skin as needed. Remove & Discard patch within 12 hours or as directed by MD)   LORazepam (ATIVAN) 1 MG tablet Take 1 tablet (1 mg total) by mouth daily as needed. for anxiety   mirabegron ER (MYRBETRIQ) 25 MG TB24 tablet Take 1 tablet (25 mg total) by mouth daily.   modafinil (PROVIGIL) 200 MG tablet Take 1 tablet (200 mg total) by mouth daily.   mometasone (ELOCON) 0.1 % cream Apply 1 application topically daily as needed (Rash). Up to 5 times per week (Patient not taking: Reported on 04/28/2023)   ondansetron (ZOFRAN-ODT) 4 MG disintegrating tablet Take 4 mg by mouth every 6 (six) hours as needed for nausea or vomiting.    Polyvinyl Alcohol-Povidone (REFRESH OP) Place  2 drops into both eyes 4 (four) times daily as needed (dryness).   Probiotic Product (PROBIOTIC PO) Take 1 capsule by mouth daily.   Teriflunomide 14 MG TABS TAKE 1 TABLET BY MOUTH DAILY   TRELEGY ELLIPTA 200-62.5-25 MCG/ACT AEPB Inhale 1 puff into the lungs daily.   [DISCONTINUED] ZEPBOUND 2.5 MG/0.5ML Pen Inject 2.5 mg into the skin once a week.   No facility-administered medications prior to visit.    Review of Systems  Constitutional:  Positive for weight loss (6 lbs). Negative for malaise/fatigue.  HENT: Negative.    Eyes: Negative.   Respiratory: Negative.    Cardiovascular: Negative.   Gastrointestinal: Negative.   Genitourinary: Negative.   Skin: Negative.   Neurological: Negative.   Endo/Heme/Allergies: Negative.   Psychiatric/Behavioral:  The patient does not have insomnia (daytime somnolence).        Objective:   BP 131/82   Pulse 85   Temp 98.2 F (36.8 C)   Ht 5\' 6"  (1.676 m)   Wt 246 lb (111.6 kg)   SpO2 98%   BMI 39.71 kg/m   Vitals:   06/16/23 1304  BP: 131/82  Pulse: 85  Temp: 98.2 F (36.8 C)  Height: 5\' 6"  (1.676 m)  Weight: 246 lb (111.6 kg)  SpO2: 98%  BMI (Calculated): 39.72    Physical Exam Vitals reviewed.  Constitutional:      General: She is not in acute distress.    Appearance: She is obese.  HENT:     Head: Normocephalic.     Nose: Nose normal.     Mouth/Throat:     Mouth: Mucous membranes are moist.  Eyes:     Extraocular Movements: Extraocular movements intact.     Pupils: Pupils are equal, round, and reactive to light.  Cardiovascular:     Rate and Rhythm: Normal rate and regular rhythm.  Heart sounds: No murmur heard. Pulmonary:     Effort: Pulmonary effort is normal.     Breath sounds: No rhonchi or rales.  Abdominal:     General: Abdomen is flat.     Palpations: There is no hepatomegaly, splenomegaly or mass.  Musculoskeletal:        General: Normal range of motion.     Cervical back: Normal range of motion.  No tenderness.  Skin:    General: Skin is warm and dry.  Neurological:     General: No focal deficit present.     Mental Status: She is alert and oriented to person, place, and time.     Cranial Nerves: No cranial nerve deficit.     Motor: No weakness.  Psychiatric:        Mood and Affect: Mood normal.        Behavior: Behavior normal.      No results found for any visits on 06/16/23.  No results found for this or any previous visit (from the past 2160 hours).    Assessment & Plan:  As per problem list  Problem List Items Addressed This Visit       Cardiovascular and Mediastinum   Essential hypertension     Genitourinary   CKD stage 3a, GFR 45-59 ml/min (HCC)   Relevant Orders   Comprehensive metabolic panel     Other   Anxiety - Primary   Morbid obesity (HCC)   Relevant Medications   tirzepatide (ZEPBOUND) 5 MG/0.5ML Pen   Other Relevant Orders   Lipid panel   Other Visit Diagnoses       Prediabetes           Return in about 2 months (around 08/16/2023) for fu with labs prior, Weight management.   Total time spent: 20 minutes  Luna Fuse, MD  06/16/2023   This document may have been prepared by [facility] Voice Recognition software and as such may include unintentional dictation errors.

## 2023-06-24 ENCOUNTER — Encounter: Payer: Self-pay | Admitting: Internal Medicine

## 2023-07-16 ENCOUNTER — Encounter: Payer: Self-pay | Admitting: Internal Medicine

## 2023-07-16 ENCOUNTER — Other Ambulatory Visit: Payer: Self-pay | Admitting: *Deleted

## 2023-07-16 MED ORDER — MIRABEGRON ER 25 MG PO TB24: 25.0000 mg | ORAL_TABLET | Freq: Every day | ORAL | 1 refills | Status: DC

## 2023-07-22 ENCOUNTER — Other Ambulatory Visit: Payer: Self-pay | Admitting: Neurology

## 2023-07-22 DIAGNOSIS — G35 Multiple sclerosis: Secondary | ICD-10-CM

## 2023-07-22 NOTE — Telephone Encounter (Signed)
 Last seen on 02/05/23 Follow up scheduled on 09/02/23

## 2023-08-13 ENCOUNTER — Other Ambulatory Visit

## 2023-08-14 LAB — LIPID PANEL
Chol/HDL Ratio: 3.3 ratio (ref 0.0–4.4)
Cholesterol, Total: 173 mg/dL (ref 100–199)
HDL: 53 mg/dL (ref >39)
LDL Chol Calc (NIH): 100 mg/dL — ABNORMAL HIGH (ref 0–99)
Triglycerides: 113 mg/dL (ref 0–149)
VLDL Cholesterol Cal: 20 mg/dL (ref 5–40)

## 2023-08-14 LAB — COMPREHENSIVE METABOLIC PANEL WITH GFR
ALT: 22 IU/L (ref 0–32)
AST: 29 IU/L (ref 0–40)
Albumin: 4.1 g/dL (ref 3.9–4.9)
Alkaline Phosphatase: 127 IU/L — ABNORMAL HIGH (ref 44–121)
BUN/Creatinine Ratio: 12 (ref 9–23)
BUN: 14 mg/dL (ref 6–24)
Bilirubin Total: 0.7 mg/dL (ref 0.0–1.2)
CO2: 25 mmol/L (ref 20–29)
Calcium: 9.2 mg/dL (ref 8.7–10.2)
Chloride: 99 mmol/L (ref 96–106)
Creatinine, Ser: 1.14 mg/dL — ABNORMAL HIGH (ref 0.57–1.00)
Globulin, Total: 2.4 g/dL (ref 1.5–4.5)
Glucose: 86 mg/dL (ref 70–99)
Potassium: 3.3 mmol/L — ABNORMAL LOW (ref 3.5–5.2)
Sodium: 140 mmol/L (ref 134–144)
Total Protein: 6.5 g/dL (ref 6.0–8.5)
eGFR: 59 mL/min/{1.73_m2} — ABNORMAL LOW (ref >59)

## 2023-08-15 ENCOUNTER — Other Ambulatory Visit: Payer: Self-pay | Admitting: Gastroenterology

## 2023-08-15 ENCOUNTER — Other Ambulatory Visit: Payer: Self-pay | Admitting: Internal Medicine

## 2023-08-15 DIAGNOSIS — R140 Abdominal distension (gaseous): Secondary | ICD-10-CM

## 2023-08-18 ENCOUNTER — Encounter: Payer: Self-pay | Admitting: Internal Medicine

## 2023-08-18 ENCOUNTER — Other Ambulatory Visit: Payer: Self-pay

## 2023-08-18 ENCOUNTER — Ambulatory Visit: Payer: Self-pay | Admitting: Internal Medicine

## 2023-08-18 ENCOUNTER — Ambulatory Visit: Admitting: Internal Medicine

## 2023-08-18 VITALS — BP 118/78 | HR 92 | Temp 97.5°F | Ht 66.0 in | Wt 229.0 lb

## 2023-08-18 DIAGNOSIS — I10 Essential (primary) hypertension: Secondary | ICD-10-CM | POA: Diagnosis not present

## 2023-08-18 DIAGNOSIS — F418 Other specified anxiety disorders: Secondary | ICD-10-CM | POA: Diagnosis not present

## 2023-08-18 MED ORDER — CHLORTHALIDONE 25 MG PO TABS: 25.0000 mg | ORAL_TABLET | Freq: Every day | ORAL | 1 refills | Status: DC

## 2023-08-18 MED ORDER — BUSPIRONE HCL 15 MG PO TABS: 15.0000 mg | ORAL_TABLET | Freq: Two times a day (BID) | ORAL | 1 refills | Status: DC

## 2023-08-18 NOTE — Progress Notes (Signed)
 Established Patient Office Visit  Subjective:  Patient ID: Sandra Bonilla, female    DOB: 11-24-1975  Age: 48 y.o. MRN: [id number]  Chief Complaint  Patient presents with   Follow-up    2 months labs/ weight management    No new complaints, here for weight management, lab review and medication refills. Lost 17 lbs and only c/o constipation on current dose of GLP-1. LDL deteriorated and no longer well controlled on lab review. Triglycerides satisfactory however.     No other concerns at this time.   Past Medical History:  Diagnosis Date   Anemia 05/2019   iron  infusions   Anxiety    Asthma    Cervical high risk human papillomavirus (HPV) DNA test positive 09/2015   Cholecystitis 2006   GALBLADDER REMOVED   Depression    Diverticulitis    Diverticulosis    Dysplastic nevus 08/24/2008   right upper back lat, right upper back medial   Dysplastic nevus 04/21/2022   upper back medial to middlescapula, mild atypia   Fibroadenoma 2016   RIGHT BREAST   GERD (gastroesophageal reflux disease)    Head injury 06/25/2016   History of mammogram 11/2014   FIBROADENOMA   History of Papanicolaou smear of cervix 01/24/13; 10/15/15   -/-; -/+   Hypertension    Menorrhagia 02/01/2010   D/C HYSTEROSCOPY ENDO POLYP   MS (multiple sclerosis) (HCC)    Neuromuscular disorder (HCC)    Ovarian cyst 9/15; 2015-2016   LEFT - WENT TO [facility]; RIGHT   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   Sleep apnea    WEAR C PAP    Past Surgical History:  Procedure Laterality Date   BIOPSY  03/21/2019   Procedure: BIOPSY;  Surgeon: Normie Becton., MD;  Location: [facility] ENDOSCOPY;  Service: Gastroenterology;;   BREAST BIOPSY Right 2016   Fibroadenoma   CHOLECYSTECTOMY  2006   COLON RESECTION N/A 08/11/2019   Procedure: DIAGNOSTIC LAPAROSCOPY WITH WASH OUT, DRAIN PLACEMENT, AND LOOP ILEOSTOMY CREATION;  Surgeon: Joyce Nixon, MD;  Location: [facility] ORS;  Service: General;  Laterality: N/A;    COLONOSCOPY WITH PROPOFOL  N/A 03/21/2019   Procedure: COLONOSCOPY WITH PROPOFOL ;  Surgeon: Normie Becton., MD;  Location: [facility] ENDOSCOPY;  Service: Gastroenterology;  Laterality: N/A;  ultraslim colonoscope    CYSTOSCOPY WITH INJECTION N/A 08/04/2019   Procedure: FIREFLY INJECTION;  Surgeon: Andrez Banker, MD;  Location: [facility] ORS;  Service: Urology;  Laterality: N/A;   DILATION AND CURETTAGE, DIAGNOSTIC / THERAPEUTIC  02/01/2010   D/C HYSTEROSCOPY ENDO POLYP; [facility]   ENDOSCOPIC MUCOSAL RESECTION  03/21/2019   Procedure: ENDOSCOPIC MUCOSAL RESECTION;  Surgeon: Brice Campi Albino Alu., MD;  Location: [facility] ENDOSCOPY;  Service: Gastroenterology;;   ESOPHAGOGASTRODUODENOSCOPY (EGD) WITH PROPOFOL  N/A 03/21/2019   Procedure: ESOPHAGOGASTRODUODENOSCOPY (EGD) WITH PROPOFOL ;  Surgeon: Normie Becton., MD;  Location: [facility] ENDOSCOPY;  Service: Gastroenterology;  Laterality: N/A;   EYE SURGERY     Lasik   FLEXIBLE SIGMOIDOSCOPY  07/31/2016   Procedure: FLEXIBLE SIGMOIDOSCOPY;  Surgeon: Joyce Nixon, MD;  Location: [facility] ENDOSCOPY;  Service: Endoscopy;;   HEMOSTASIS CLIP PLACEMENT  03/21/2019   Procedure: HEMOSTASIS CLIP PLACEMENT;  Surgeon: Normie Becton., MD;  Location: [facility] ENDOSCOPY;  Service: Gastroenterology;;   HYSTEROSCOPY  02/01/2010   ENDO POLYP - [facility]   IR RADIOLOGIST EVAL & MGMT  11/16/2018   IR RADIOLOGIST EVAL & MGMT  09/06/2019   SCLEROTHERAPY  03/21/2019   Procedure: Daryle Eon;  Surgeon: Mansouraty, Albino Alu., MD;  Location: [facility] ENDOSCOPY;  Service: Gastroenterology;;    Social History   Socioeconomic History   Marital status: Divorced    Spouse name: Not on file   Number of children: 0   Years of education: 14   Highest education level: Not on file  Occupational History    Employer: [employer]  Tobacco Use   Smoking status: Former    Current packs/day: 0.00    Types: Cigarettes    Start date: 2000    Quit date: 2010    Years since quitting: 15.3    Smokeless tobacco: Never  Vaping Use   Vaping status: Never Used  Substance and Sexual Activity   Alcohol  use: No   Drug use: No   Sexual activity: Yes    Partners: Male    Birth control/protection: I.U.D.    Comment: Mirena   Other Topics Concern   Not on file  Social History Narrative   Not on file   Social Drivers of Health   Financial Resource Strain: Low Risk  (05/19/2023)   Received from [facility]   Overall Financial Resource Strain (CARDIA)    Difficulty of Paying Living Expenses: Not hard at all  Food Insecurity: No Food Insecurity (05/19/2023)   Received from [facility]   Hunger Vital Sign    Worried About Running Out of Food in the Last Year: Never true    Ran Out of Food in the Last Year: Never true  Transportation Needs: No Transportation Needs (05/19/2023)   Received from [facility] - Transportation    In the past 12 months, has lack of transportation kept you from medical appointments or from getting medications?: No    Lack of Transportation (Non-Medical): No  Physical Activity: Not on file  Stress: Not on file  Social Connections: Not on file  Intimate Partner Violence: Not on file    Family History  Problem Relation Age of Onset   Rheum arthritis Mother    Hypertension Mother    Thyroid  disease Mother        HYPOTHYROIDISM   Lung cancer Mother 22   Heart disease Father    Diabetes Father    Hypertension Father    Breast cancer Maternal Aunt 34       has contact   Cancer Paternal Grandmother        MELANOMA OF SKIN   Cancer Cousin        KIDNEY-COUSIN   Colon cancer Neg Hx    Stomach cancer Neg Hx    Pancreatic cancer Neg Hx    Esophageal cancer Neg Hx    Inflammatory bowel disease Neg Hx    Rectal cancer Neg Hx    Colon polyps Neg Hx    Crohn'Azeneth Carbonell disease Neg Hx    Ulcerative colitis Neg Hx    Multiple sclerosis Neg Hx     Allergies  Allergen Reactions    Sulfamethoxazole-Trimethoprim Rash and Hives   Sulfa Antibiotics Other (See Comments) and Rash    Outpatient Medications Prior to Visit  Medication Sig   acetaminophen  (TYLENOL ) 325 MG tablet Take 2 tablets (650 mg total) by mouth every 6 (six) hours as needed for mild pain (or Fever >/= 101).   cholecalciferol  (VITAMIN D3) 25 MCG (1000 UT) tablet Take 1,000 Units by mouth daily.   cyclobenzaprine  (FLEXERIL ) 5 MG tablet Take 1 tablet (5 mg total) by mouth at bedtime as needed. (Patient taking differently: Take 5 mg  by mouth at bedtime as needed for muscle spasms.)   esomeprazole  (NEXIUM ) 40 MG capsule TAKE ONE CAPSULE BY MOUTH TWICE DAILY. AFTER 2 MONTHS TAKE ONCE DAILY AS DIRECTED   FLUoxetine  (PROZAC ) 10 MG capsule TAKE 1 CAPSULE(10 MG) BY MOUTH DAILY   fluticasone  (FLONASE) 50 MCG/ACT nasal spray Place into the nose.   gabapentin  (NEURONTIN ) 300 MG capsule TAKE 1 CAPSULE BY MOUTH IN THE  MORNING , 1 CAPSULE IN THE  EVENING AND 2 CAPSULES AT  BEDTIME   levonorgestrel  (MIRENA ) 20 MCG/DAY IUD 1 each by Intrauterine route once.   lidocaine  (LIDODERM ) 5 % Place 1 patch onto the skin daily. Remove & Discard patch within 12 hours or as directed by MD (Patient taking differently: Place 1 patch onto the skin as needed. Remove & Discard patch within 12 hours or as directed by MD)   LORazepam  (ATIVAN ) 1 MG tablet Take 1 tablet (1 mg total) by mouth daily as needed. for anxiety   mirabegron  ER (MYRBETRIQ ) 25 MG TB24 tablet Take 1 tablet (25 mg total) by mouth daily.   modafinil  (PROVIGIL ) 200 MG tablet Take 1 tablet (200 mg total) by mouth daily.   mometasone  (ELOCON ) 0.1 % cream Apply 1 application topically daily as needed (Rash). Up to 5 times per week   ondansetron  (ZOFRAN -ODT) 4 MG disintegrating tablet Take 4 mg by mouth every 6 (six) hours as needed for nausea or vomiting.    Polyvinyl Alcohol -Povidone (REFRESH OP) Place 2 drops into both eyes 4 (four) times daily as needed (dryness).   Probiotic  Product (PROBIOTIC PO) Take 1 capsule by mouth daily.   Teriflunomide  14 MG TABS TAKE 1 TABLET BY MOUTH DAILY   tirzepatide  (ZEPBOUND ) 5 MG/0.5ML Pen ADMINISTER 5 MG UNDER THE SKIN 1 TIME A WEEK   TRELEGY ELLIPTA 200-62.5-25 MCG/ACT AEPB Inhale 1 puff into the lungs daily.   [DISCONTINUED] busPIRone  (BUSPAR ) 15 MG tablet TAKE 1 TABLET BY MOUTH TWICE DAILY   [DISCONTINUED] chlorthalidone  (HYGROTON ) 25 MG tablet TAKE ONE-HALF TABLET BY MOUTH  DAILY AS DIRECTED   [DISCONTINUED] colchicine 0.6 MG tablet Take 0.6 mg by mouth daily. (Patient not taking: Reported on 08/18/2023)   No facility-administered medications prior to visit.    Review of Systems  Constitutional:  Positive for weight loss (17 lbs). Negative for malaise/fatigue.  HENT: Negative.    Eyes: Negative.   Respiratory: Negative.    Cardiovascular: Negative.   Gastrointestinal: Negative.   Genitourinary: Negative.   Skin: Negative.   Neurological: Negative.   Endo/Heme/Allergies: Negative.   Psychiatric/Behavioral:  The patient does not have insomnia (daytime somnolence).        Objective:   BP 118/78   Pulse 92   Temp (!) 97.5 F (36.4 C) (Tympanic)   Ht 5\' 6"  (1.676 m)   Wt 229 lb (103.9 kg)   SpO2 99%   BMI 36.96 kg/m   Vitals:   08/18/23 1155  BP: 118/78  Pulse: 92  Temp: (!) 97.5 F (36.4 C)  Height: 5\' 6"  (1.676 m)  Weight: 229 lb (103.9 kg)  SpO2: 99%  TempSrc: Tympanic  BMI (Calculated): 36.98    Physical Exam Vitals reviewed.  Constitutional:      General: She is not in acute distress.    Appearance: She is obese.  HENT:     Head: Normocephalic.     Nose: Nose normal.     Mouth/Throat:     Mouth: Mucous membranes are moist.  Eyes:  Extraocular Movements: Extraocular movements intact.     Pupils: Pupils are equal, round, and reactive to light.  Cardiovascular:     Rate and Rhythm: Normal rate and regular rhythm.     Heart sounds: No murmur heard. Pulmonary:     Effort: Pulmonary  effort is normal.     Breath sounds: No rhonchi or rales.  Abdominal:     General: Abdomen is flat.     Palpations: There is no hepatomegaly, splenomegaly or mass.  Musculoskeletal:        General: Normal range of motion.     Cervical back: Normal range of motion. No tenderness.  Skin:    General: Skin is warm and dry.  Neurological:     General: No focal deficit present.     Mental Status: She is alert and oriented to person, place, and time.     Cranial Nerves: No cranial nerve deficit.     Motor: No weakness.  Psychiatric:        Mood and Affect: Mood normal.        Behavior: Behavior normal.      No results found for any visits on 08/18/23.  Recent Results (from the past 2160 hours)  Comprehensive metabolic panel     Status: Abnormal   Collection Time: 08/13/23  8:39 AM  Result Value Ref Range   Glucose 86 70 - 99 mg/dL   BUN 14 6 - 24 mg/dL   Creatinine, Ser 1.61 (H) 0.57 - 1.00 mg/dL   eGFR 59 (L) >09 UE/AVW/0.98   BUN/Creatinine Ratio 12 9 - 23   Sodium 140 134 - 144 mmol/L   Potassium 3.3 (L) 3.5 - 5.2 mmol/L   Chloride 99 96 - 106 mmol/L   CO2 25 20 - 29 mmol/L   Calcium  9.2 8.7 - 10.2 mg/dL   Total Protein 6.5 6.0 - 8.5 g/dL   Albumin 4.1 3.9 - 4.9 g/dL   Globulin, Total 2.4 1.5 - 4.5 g/dL   Bilirubin Total 0.7 0.0 - 1.2 mg/dL   Alkaline Phosphatase 127 (H) 44 - 121 IU/L   AST 29 0 - 40 IU/L   ALT 22 0 - 32 IU/L  Lipid panel     Status: Abnormal   Collection Time: 08/13/23  8:39 AM  Result Value Ref Range   Cholesterol, Total 173 100 - 199 mg/dL   Triglycerides 119 0 - 149 mg/dL   HDL 53 >14 mg/dL   VLDL Cholesterol Cal 20 5 - 40 mg/dL   LDL Chol Calc (NIH) 782 (H) 0 - 99 mg/dL   Chol/HDL Ratio 3.3 0.0 - 4.4 ratio    Comment:                                   T. Chol/HDL Ratio                                             Men  Women                               1/2 Avg.Risk  3.4    3.3  Avg.Risk  5.0    4.4                                 2X Avg.Risk  9.6    7.1                                3X Avg.Risk 23.4   11.0       Assessment & Plan:  As per problem list. Continue same dose of GLP-1 for now. Stricter low calorie diet, low cholesterol and low fat diet and exercise as much as possible.  Problem List Items Addressed This Visit       Other   Depression with anxiety   Relevant Medications   busPIRone  (BUSPAR ) 15 MG tablet   Other Visit Diagnoses       Primary hypertension    -  Primary   Relevant Medications   chlorthalidone  (HYGROTON ) 25 MG tablet       Return in about 2 months (around 10/18/2023) for Weight management.   Total time spent: 20 minutes  Arzella Bitters, MD  08/18/2023   This document may have been prepared by [facility] Voice Recognition software and as such may include unintentional dictation errors.

## 2023-08-19 ENCOUNTER — Other Ambulatory Visit: Payer: Self-pay | Admitting: Neurology

## 2023-08-20 ENCOUNTER — Other Ambulatory Visit: Payer: Self-pay

## 2023-08-20 ENCOUNTER — Encounter: Payer: Self-pay | Admitting: Neurology

## 2023-08-20 MED ORDER — MODAFINIL 200 MG PO TABS: 200.0000 mg | ORAL_TABLET | Freq: Every day | ORAL | 5 refills | Status: DC

## 2023-08-20 NOTE — Progress Notes (Signed)
 Meds ordered this encounter  Medications   DISCONTD: modafinil  (PROVIGIL ) 200 MG tablet    Sig: Take 1 tablet (200 mg total) by mouth daily.    Dispense:  30 tablet    Refill:  5   modafinil  (PROVIGIL ) 200 MG tablet    Sig: Take 1 tablet (200 mg total) by mouth daily.    Dispense:  30 tablet    Refill:  5

## 2023-08-21 ENCOUNTER — Telehealth: Payer: Self-pay | Admitting: Gastroenterology

## 2023-08-21 DIAGNOSIS — R140 Abdominal distension (gaseous): Secondary | ICD-10-CM

## 2023-08-21 MED ORDER — ESOMEPRAZOLE MAGNESIUM 40 MG PO CPDR: 40.0000 mg | DELAYED_RELEASE_CAPSULE | Freq: Every day | ORAL | 1 refills | Status: DC

## 2023-08-21 NOTE — Telephone Encounter (Signed)
 Rx sent to [facility] for 90 day supply. Patient informed.

## 2023-08-21 NOTE — Telephone Encounter (Signed)
 It has been a year since the patient has been seen. The patient needs to schedule office follow-up appointment. Then I will send 90 day supply and patient needs to keep follow-up appointment.

## 2023-08-21 NOTE — Telephone Encounter (Signed)
 I called patient and advised her per information below. Patient scheduled an appointment for 11/13/2023 at 2:10 PM. Please advise.

## 2023-08-21 NOTE — Telephone Encounter (Signed)
 Patient called and stated that she went to her pharmacy this morning to request a refill on her medication esomeprazole  but only has a 60 day supply so her insurance will not cover this medication. Patient is requesting that we send a new script for her esomeprazole  for 90 days because her insurance will only cover the 90 days. Patient is requesting a call back. Please advise.

## 2023-08-25 ENCOUNTER — Telehealth: Payer: Self-pay

## 2023-08-25 ENCOUNTER — Other Ambulatory Visit (HOSPITAL_COMMUNITY): Payer: Self-pay

## 2023-08-25 NOTE — Telephone Encounter (Signed)
 Pharmacy Patient Advocate Encounter   Received notification from CoverMyMeds that prior authorization for Modafinil  200MG  tablets is required/requested.   Insurance verification completed.   The patient is insured through [facility] .   Per test claim: PA required; PA submitted to above mentioned insurance via CoverMyMeds Key/confirmation #/EOC [facility] Status is pending

## 2023-08-28 ENCOUNTER — Other Ambulatory Visit (HOSPITAL_COMMUNITY): Payer: Self-pay

## 2023-08-28 NOTE — Telephone Encounter (Signed)
 Pharmacy Patient Advocate Encounter  Received notification from [employer] that Prior Authorization for Modafinil  200MG  tablets has been APPROVED from 08/25/2023 to 02/25/2024. Unable to obtain price due to refill too soon rejection, last fill date 08/26/2023 next available fill date6/20/2025   PA #/Case ID/Reference #: PA Case ID #: [id number]

## 2023-09-02 ENCOUNTER — Ambulatory Visit: Payer: BC Managed Care – PPO | Admitting: Family Medicine

## 2023-09-02 ENCOUNTER — Encounter: Payer: Self-pay | Admitting: Family Medicine

## 2023-09-02 VITALS — BP 126/86 | HR 81 | Ht 66.0 in | Wt 230.8 lb

## 2023-09-02 DIAGNOSIS — R5382 Chronic fatigue, unspecified: Secondary | ICD-10-CM

## 2023-09-02 DIAGNOSIS — G4733 Obstructive sleep apnea (adult) (pediatric): Secondary | ICD-10-CM

## 2023-09-02 DIAGNOSIS — R269 Unspecified abnormalities of gait and mobility: Secondary | ICD-10-CM

## 2023-09-02 DIAGNOSIS — R202 Paresthesia of skin: Secondary | ICD-10-CM

## 2023-09-02 DIAGNOSIS — R200 Anesthesia of skin: Secondary | ICD-10-CM | POA: Diagnosis not present

## 2023-09-02 DIAGNOSIS — G35 Multiple sclerosis: Secondary | ICD-10-CM

## 2023-09-02 DIAGNOSIS — R208 Other disturbances of skin sensation: Secondary | ICD-10-CM

## 2023-09-02 DIAGNOSIS — E559 Vitamin D deficiency, unspecified: Secondary | ICD-10-CM | POA: Diagnosis not present

## 2023-09-02 DIAGNOSIS — R350 Frequency of micturition: Secondary | ICD-10-CM

## 2023-09-02 NOTE — Progress Notes (Signed)
 Chief Complaint  Patient presents with   Follow-up    Pt in room 1. Alone. Here for MS follow up. On Aubagio .  Pt said within last month she noticed bilateral numbness/tingling in legs and feet.  Has noticed bilateral hand cramping, fatigue, uses cpap machine nightly managed by pulmonologist.  balance is off, no falls. Next eye exam July.     HISTORY OF PRESENT ILLNESS:  09/02/23 ALL: Sandra Bonilla is a 48 y.o. female here today for follow up for RRMS. She continues Aubagio  and tolerating well. Labs have been stable. Last MRI brain stable in 07/2022.  She feels that she is not feeling as well. She is having more numbness and tingling in both legs and feet. Some in both upper extremities as well. She reports a new concern of left upper ext weakness. Worse in the mornings. Started a month ago. She feels more off balance. No falls. She does not use an assistive device.   Gabapentin  helps radicular pain in legs. She has gabapentin  up to 300/300/600. Usually she can take 300mg  in the am and 300mg  in the pm. She has not needed meloxicam  or cyclobenzaprine  recently. She has had more muscle spasms in legs at night. She only takes these meds when pain is really bad. She does feel that lidocaine  patches help. She does take Celebrex daily prescribed by PCP for back pain but has not taken it in a while.   Mood is good. Ativan , fluoxetine  and buspirone  help with anxiety prescribed by PCP. She feels anxiety may be a little worse than it has been. She described more brain fog. She is a Producer, television/film/video for [employer]. Recent HST showed mild OSA. PCP started her on CPAP therapy. Now managed by pulmonology in [place], per patient. Weight management advised. She is now on Zepbound . Lost 30lbs. Dr Godwin Lat started modafinil  01/2023. She does feel like it has helped significantly with afternoon fatigue. She continues to have trouble getting to sleep. She usually wakes around 6:30a and goes to bed around 9:30.    She continues Mybetriq 25mg  QD for urinary frequency. It seems to provide better control than oxybutynin . She does have some urge incontinence.   She continues vitamin D  supplements OTC. She thinks the is taking 1000iu daily.   HISTORY (copied from Dr Thom Fleeting previous note)  Sandra Bonilla is a 48 y.o. 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     Update 02/05/2023 She feels her MS is mostly stable and she denies any exacerbation.   MRI 02/2020 showed no new lesions.   MRI 08/12/2022 showed no new lesions  She is on Aubagio  and tolerates it well.     Gait is a little off due to both balance and strength.  Sometimes she feels she veers to the right.   No falls.  She uses the rails on stairs. Numbness has resolved. She has urinary frequency and is on Myrbetriq . The oxybutynin  had stopped helping after a while ad she stopped.   No UTI's.    Vision is fine.         She has fatigue daily, seems worse since daylight savings change.  She notes insomnia.  She snores and occasionally wakes up with a snort.   She has gained some weight.     She uses the CPAP daily but does not note any decrease in EDS.    Tee home study was done 03/01/2023 and showed mild OSA with AHI=11.9.   She started CPAP due to  the EDS.  She denies depression but has anxiety.  She is on Buspar  and lorazepam  for the anxiety.  She notes mild cognitive fog at times.   Mild reduced focus/attention  She has HA about once a week - no nausea or photophobia.    She is on computer much of the day.    EPWORTH SLEEPINESS SCALE  On a scale of 0 - 3 what is the chance of dozing:  Sitting and Reading:   2 Watching TV:    3 Sitting inactive in a public place: 0 Passenger in car for one hour: 1 Lying down to rest in the afternoon: 3 Sitting and talking to someone: 0 Sitting quietly after lunch:  1 In a car, stopped in traffic:  0  Total (out of 24):   10/24   mild ESS    MS HIstory:       About  2006, she noted gait changes and her  right foot was clumsy.    She had an MRI consistent with MS and was referred to Dr. Jessy Morocco [facility]) who diagnosed her with MS.   She then saw Dr. Valencia Gary and then Dr. Nicoletta Barrier at [facility].  She was reluctant to take a medication until  2012 years ago when she started Copaxone.   She had skin reactions, she stopped after one year and switched to Aubagio  (+/- late 2013).   She has tolerated it well.     Imaging MRI brain 03/06/2020:  Multiple T2/FLAIR hyperintense foci in the periventricular, juxtacortical and deep white matter of both hemispheres.  Additional foci are noted in the spinal cord adjacent to C2, right cerebral peduncle and right cerebellar hemisphere.  None of the foci appear to be acute and they do not enhance.  Compared to the MRI dated 01/20/2018, there are no new lesions.  Small developmental venous anomaly in the right medial parietal lobe  MRI 07/2022 showed no new lesions  Home Sleep test 02/28/2022 showed mild OSA with pAHI3% of 11.9/h   REVIEW OF SYSTEMS: Out of a complete 14 system review of symptoms, the patient complains only of the following symptoms, lower extremity swelling , dysesthesias, anxiety, fatigue, urinary frequency, urge incontinence, and all other reviewed systems are negative.   ALLERGIES: Allergies  Allergen Reactions   Sulfamethoxazole-Trimethoprim Rash and Hives   Sulfa Antibiotics Other (See Comments) and Rash     HOME MEDICATIONS: Outpatient Medications Prior to Visit  Medication Sig Dispense Refill   acetaminophen  (TYLENOL ) 325 MG tablet Take 2 tablets (650 mg total) by mouth every 6 (six) hours as needed for mild pain (or Fever >/= 101).     busPIRone  (BUSPAR ) 15 MG tablet Take 1 tablet (15 mg total) by mouth 2 (two) times daily. 180 tablet 1   chlorthalidone  (HYGROTON ) 25 MG tablet Take 1 tablet (25 mg total) by mouth daily. 90 tablet 1   cholecalciferol  (VITAMIN D3) 25 MCG (1000 UT) tablet Take 1,000 Units by mouth daily.     cyclobenzaprine   (FLEXERIL ) 5 MG tablet Take 1 tablet (5 mg total) by mouth at bedtime as needed. (Patient taking differently: Take 5 mg by mouth at bedtime as needed for muscle spasms.) 30 tablet 11   esomeprazole  (NEXIUM ) 40 MG capsule Take 1 capsule (40 mg total) by mouth daily. 90 capsule 1   FLUoxetine  (PROZAC ) 10 MG capsule TAKE 1 CAPSULE(10 MG) BY MOUTH DAILY 90 capsule 1   fluticasone  (FLONASE) 50 MCG/ACT nasal spray Place into the nose.  gabapentin  (NEURONTIN ) 300 MG capsule TAKE 1 CAPSULE BY MOUTH IN THE  MORNING , 1 CAPSULE IN THE  EVENING AND 2 CAPSULES AT  BEDTIME 360 capsule 2   levonorgestrel  (MIRENA ) 20 MCG/DAY IUD 1 each by Intrauterine route once.     lidocaine  (LIDODERM ) 5 % Place 1 patch onto the skin daily. Remove & Discard patch within 12 hours or as directed by MD (Patient taking differently: Place 1 patch onto the skin as needed. Remove & Discard patch within 12 hours or as directed by MD) 90 patch 0   LORazepam  (ATIVAN ) 1 MG tablet Take 1 tablet (1 mg total) by mouth daily as needed. for anxiety 90 tablet 0   mirabegron  ER (MYRBETRIQ ) 25 MG TB24 tablet Take 1 tablet (25 mg total) by mouth daily. 90 tablet 1   modafinil  (PROVIGIL ) 200 MG tablet Take 1 tablet (200 mg total) by mouth daily. 30 tablet 5   mometasone  (ELOCON ) 0.1 % cream Apply 1 application topically daily as needed (Rash). Up to 5 times per week 45 g 1   ondansetron  (ZOFRAN -ODT) 4 MG disintegrating tablet Take 4 mg by mouth every 6 (six) hours as needed for nausea or vomiting.      Polyvinyl Alcohol -Povidone (REFRESH OP) Place 2 drops into both eyes 4 (four) times daily as needed (dryness).     Probiotic Product (PROBIOTIC PO) Take 1 capsule by mouth daily.     Teriflunomide  14 MG TABS TAKE 1 TABLET BY MOUTH DAILY 30 tablet 1   tirzepatide  (ZEPBOUND ) 5 MG/0.5ML Pen ADMINISTER 5 MG UNDER THE SKIN 1 TIME A WEEK 2 mL 0   TRELEGY ELLIPTA 200-62.5-25 MCG/ACT AEPB Inhale 1 puff into the lungs daily.     No facility-administered  medications prior to visit.     PAST MEDICAL HISTORY: Past Medical History:  Diagnosis Date   Anemia 05/2019   iron  infusions   Anxiety    Asthma    Cervical high risk human papillomavirus (HPV) DNA test positive 09/2015   Cholecystitis 2006   GALBLADDER REMOVED   Depression    Diverticulitis    Diverticulosis    Dysplastic nevus 08/24/2008   right upper back lat, right upper back medial   Dysplastic nevus 04/21/2022   upper back medial to middlescapula, mild atypia   Fibroadenoma 2016   RIGHT BREAST   GERD (gastroesophageal reflux disease)    Head injury 06/25/2016   History of mammogram 11/2014   FIBROADENOMA   History of Papanicolaou smear of cervix 01/24/13; 10/15/15   -/-; -/+   Hypertension    Menorrhagia 02/01/2010   D/C HYSTEROSCOPY ENDO POLYP   MS (multiple sclerosis) (HCC)    Neuromuscular disorder (HCC)    Ovarian cyst 9/15; 2015-2016   LEFT - WENT TO [place]; RIGHT   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   Sleep apnea    WEAR C PAP     PAST SURGICAL HISTORY: Past Surgical History:  Procedure Laterality Date   BIOPSY  03/21/2019   Procedure: BIOPSY;  Surgeon: Normie Becton., MD;  Location: [facility] ENDOSCOPY;  Service: Gastroenterology;;   BREAST BIOPSY Right 2016   Fibroadenoma   CHOLECYSTECTOMY  2006   COLON RESECTION N/A 08/11/2019   Procedure: DIAGNOSTIC LAPAROSCOPY WITH WASH OUT, DRAIN PLACEMENT, AND LOOP ILEOSTOMY CREATION;  Surgeon: Joyce Nixon, MD;  Location: [facility] ORS;  Service: General;  Laterality: N/A;   COLONOSCOPY WITH PROPOFOL  N/A 03/21/2019   Procedure: COLONOSCOPY WITH PROPOFOL ;  Surgeon: Normie Becton., MD;  Location: [facility] ENDOSCOPY;  Service: Gastroenterology;  Laterality: N/A;  ultraslim colonoscope    CYSTOSCOPY WITH INJECTION N/A 08/04/2019   Procedure: FIREFLY INJECTION;  Surgeon: Andrez Banker, MD;  Location: [facility] ORS;  Service: Urology;  Laterality: N/A;   DILATION AND CURETTAGE, DIAGNOSTIC / THERAPEUTIC   02/01/2010   D/C HYSTEROSCOPY ENDO POLYP; [facility]   ENDOSCOPIC MUCOSAL RESECTION  03/21/2019   Procedure: ENDOSCOPIC MUCOSAL RESECTION;  Surgeon: Brice Campi Albino Alu., MD;  Location: [facility] ENDOSCOPY;  Service: Gastroenterology;;   ESOPHAGOGASTRODUODENOSCOPY (EGD) WITH PROPOFOL  N/A 03/21/2019   Procedure: ESOPHAGOGASTRODUODENOSCOPY (EGD) WITH PROPOFOL ;  Surgeon: Normie Becton., MD;  Location: [facility] ENDOSCOPY;  Service: Gastroenterology;  Laterality: N/A;   EYE SURGERY     Lasik   FLEXIBLE SIGMOIDOSCOPY  07/31/2016   Procedure: FLEXIBLE SIGMOIDOSCOPY;  Surgeon: Joyce Nixon, MD;  Location: [facility] ENDOSCOPY;  Service: Endoscopy;;   HEMOSTASIS CLIP PLACEMENT  03/21/2019   Procedure: HEMOSTASIS CLIP PLACEMENT;  Surgeon: Normie Becton., MD;  Location: [facility] ENDOSCOPY;  Service: Gastroenterology;;   HYSTEROSCOPY  02/01/2010   ENDO POLYP - [facility]   IR RADIOLOGIST EVAL & MGMT  11/16/2018   IR RADIOLOGIST EVAL & MGMT  09/06/2019   SCLEROTHERAPY  03/21/2019   Procedure: Daryle Eon;  Surgeon: Mansouraty, Albino Alu., MD;  Location: [facility] ENDOSCOPY;  Service: Gastroenterology;;     FAMILY HISTORY: Family History  Problem Relation Age of Onset   Rheum arthritis Mother    Hypertension Mother    Thyroid  disease Mother        HYPOTHYROIDISM   Lung cancer Mother 100   Heart disease Father    Diabetes Father    Hypertension Father    Breast cancer Maternal Aunt 17       has contact   Cancer Paternal Grandmother        MELANOMA OF SKIN   Cancer Cousin        KIDNEY-COUSIN   Colon cancer Neg Hx    Stomach cancer Neg Hx    Pancreatic cancer Neg Hx    Esophageal cancer Neg Hx    Inflammatory bowel disease Neg Hx    Rectal cancer Neg Hx    Colon polyps Neg Hx    Crohn's disease Neg Hx    Ulcerative colitis Neg Hx    Multiple sclerosis Neg Hx      SOCIAL HISTORY: Social History   Socioeconomic History   Marital status: Divorced    Spouse name: Not on file   Number of children: 0    Years of education: 14   Highest education level: Not on file  Occupational History    Employer: [employer]  Tobacco Use   Smoking status: Former    Current packs/day: 0.00    Types: Cigarettes    Start date: 2000    Quit date: 2010    Years since quitting: 15.4   Smokeless tobacco: Never  Vaping Use   Vaping status: Never Used  Substance and Sexual Activity   Alcohol  use: No   Drug use: No   Sexual activity: Yes    Partners: Male    Birth control/protection: I.U.D.    Comment: Mirena   Other Topics Concern   Not on file  Social History Narrative   Not on file   Social Drivers of Health   Financial Resource Strain: Low Risk  (05/19/2023)   Received from [facility]   Overall Financial Resource Strain (CARDIA)    Difficulty of Paying Living Expenses: Not hard at all  Food Insecurity: No Food Insecurity (05/19/2023)   Received from [facility]   Hunger Vital Sign    Worried About Running Out of Food in the Last Year: Never true    Ran Out of Food in the Last Year: Never true  Transportation Needs: No Transportation Needs (05/19/2023)   Received from [facility] - Transportation    In the past 12 months, has lack of transportation kept you from medical appointments or from getting medications?: No    Lack of Transportation (Non-Medical): No  Physical Activity: Not on file  Stress: Not on file  Social Connections: Not on file  Intimate Partner Violence: Not on file      PHYSICAL EXAM  Vitals:   09/02/23 1440 09/02/23 1445  BP: (!) 149/94 126/86  Pulse: 81   Weight: 230 lb 12.8 oz (104.7 kg)   Height: 5\' 6"  (1.676 m)      Body mass index is 37.25 kg/m.   Generalized: Well developed, in no acute distress  Cardiology: normal rate and rhythm, no murmur auscultated  Respiratory: clear to auscultation bilaterally    Neurological examination  Mentation: Alert oriented to time, place, history taking.  Follows all commands speech and language fluent Cranial nerve II-XII: Pupils were equal round reactive to light. Extraocular movements were full, visual field were full on confrontational test. Facial sensation and strength were normal. Uvula tongue midline. Head turning and shoulder shrug  were normal and symmetric. Motor: The motor testing reveals 5 over 5 strength of all 4 extremities. Good symmetric motor tone is noted throughout.  Sensory: Sensory testing is intact to soft touch on all 4 extremities. No evidence of extinction is noted.  Coordination: Cerebellar testing reveals good finger-nose-finger and heel-to-shin bilaterally.  Gait and station: Gait is normal.  Reflexes: Deep tendon reflexes are symmetric and normal bilaterally.     DIAGNOSTIC DATA (LABS, IMAGING, TESTING) - I reviewed patient records, labs, notes, testing and imaging myself where available.  Lab Results  Component Value Date   WBC 8.7 07/29/2022   HGB 13.8 07/29/2022   HCT 42.0 07/29/2022   MCV 82 07/29/2022   PLT 333 07/29/2022      Component Value Date/Time   NA 140 08/13/2023 0839   K 3.3 (L) 08/13/2023 0839   CL 99 08/13/2023 0839   CO2 25 08/13/2023 0839   GLUCOSE 86 08/13/2023 0839   GLUCOSE 110 (H) 12/06/2019 1653   BUN 14 08/13/2023 0839   CREATININE 1.14 (H) 08/13/2023 0839   CALCIUM  9.2 08/13/2023 0839   PROT 6.5 08/13/2023 0839   ALBUMIN 4.1 08/13/2023 0839   AST 29 08/13/2023 0839   ALT 22 08/13/2023 0839   ALKPHOS 127 (H) 08/13/2023 0839   BILITOT 0.7 08/13/2023 0839   GFRNONAA 105 02/29/2020 0810   GFRAA 121 02/29/2020 0810   Lab Results  Component Value Date   CHOL 173 08/13/2023   HDL 53 08/13/2023   LDLCALC 100 (H) 08/13/2023   TRIG 113 08/13/2023   CHOLHDL 3.3 08/13/2023   Lab Results  Component Value Date   HGBA1C 5.9 (H) 02/20/2023   Lab Results  Component Value Date   VITAMINB12 235 03/11/2019   Lab Results  Component Value Date   TSH 0.87 12/23/2018       ASSESSMENT AND PLAN  48 y.o. year old female  has a past medical history of Anemia (05/2019), Anxiety, Asthma, Cervical high risk human papillomavirus (HPV) DNA test positive (  09/2015), Cholecystitis (2006), Depression, Diverticulitis, Diverticulosis, Dysplastic nevus (08/24/2008), Dysplastic nevus (04/21/2022), Fibroadenoma (2016), GERD (gastroesophageal reflux disease), Head injury (06/25/2016), History of mammogram (11/2014), History of Papanicolaou smear of cervix (01/24/13; 10/15/15), Hypertension, Menorrhagia (02/01/2010), MS (multiple sclerosis) (HCC), Neuromuscular disorder (HCC), Ovarian cyst (9/15; 2015-2016), Pericolonic abscess due to diverticulitis (2020), and Sleep apnea. here with   Relapsing remitting multiple sclerosis (HCC) - Plan: MR BRAIN W WO CONTRAST, MR CERVICAL SPINE W WO CONTRAST, CMP, CBC with Differential/Platelets  Numbness and tingling - Plan: B12 and Folate Panel  Chronic fatigue - Plan: B12 and Folate Panel, Vitamin D , 25-hydroxy  Vitamin D  deficiency - Plan: Vitamin D , 25-hydroxy  Raquel hasn't felt as well for the past 2-3 months. She reports worsening balance, numbness/tingling in both legs and new concerns of left upper ext weakness. Neuro exam is intact. No weakness noted. We will continue Aubagio  as prescribed. MRI stable 07/2022, however, due to new concerns, we will repeat brain and cervical MRI for evaluation. We will update labs, today. PT referral offered but declined. She will continue gabapentin  up to 300mg  BID and 600mg  at bedtime.  May use cyclobenzaprine  and meloxicam  as needed. Advised not to use meloxicam  and Celebrex together. We will continue Myrbetriq  25mg  QD. Continue modafinil  200mg  daily. Advised to take early in the morning to prevent worsening of insomnia. She will continue CPAP therapy per pulmonology. Consider adding exercise regimen. Healthy lifestyle habits advised.  She will follow-up with Dr. Godwin Lat in 6 months, sooner if needed.   She verbalizes understanding and agreement with this plan.   Terrilyn Fick, MSN, FNP-C 09/02/2023, 3:15 PM  [facility] [address] [place], [place] [address] [phone]

## 2023-09-02 NOTE — Patient Instructions (Addendum)
 Below is our plan:  We will continue current treatment plan. I will update labs and look for possible causes of fatigue. We will check imaging due to numbness and tingling. Monitor closely closely. Y adding either an extra dose of gabapentin  or cyclobenzaprine  at bedtime to help with sleep and muscle cramps.   Please make sure you are staying well hydrated. I recommend 50-60 ounces daily. Well balanced diet and regular exercise encouraged. Consistent sleep schedule with 6-8 hours recommended.   Please continue follow up with care team as directed.   Follow up with Dr Godwin Lat in 6 months   You may receive a survey regarding today's visit. I encourage you to leave honest feed back as I do use this information to improve patient care. Thank you for seeing me today!

## 2023-09-03 ENCOUNTER — Encounter: Payer: Self-pay | Admitting: Internal Medicine

## 2023-09-03 ENCOUNTER — Other Ambulatory Visit: Payer: Self-pay | Admitting: Internal Medicine

## 2023-09-03 ENCOUNTER — Ambulatory Visit: Payer: Self-pay | Admitting: Family Medicine

## 2023-09-03 DIAGNOSIS — E042 Nontoxic multinodular goiter: Secondary | ICD-10-CM

## 2023-09-03 DIAGNOSIS — I10 Essential (primary) hypertension: Secondary | ICD-10-CM

## 2023-09-03 LAB — CBC WITH DIFFERENTIAL/PLATELET
Basophils Absolute: 0.1 10*3/uL (ref 0.0–0.2)
Basos: 2 %
EOS (ABSOLUTE): 0.1 10*3/uL (ref 0.0–0.4)
Eos: 2 %
Hematocrit: 37.4 % (ref 34.0–46.6)
Hemoglobin: 11.8 g/dL (ref 11.1–15.9)
Immature Grans (Abs): 0 10*3/uL (ref 0.0–0.1)
Immature Granulocytes: 0 %
Lymphocytes Absolute: 2.4 10*3/uL (ref 0.7–3.1)
Lymphs: 36 %
MCH: 31.1 pg (ref 26.6–33.0)
MCHC: 31.6 g/dL (ref 31.5–35.7)
MCV: 99 fL — ABNORMAL HIGH (ref 79–97)
Monocytes Absolute: 0.6 10*3/uL (ref 0.1–0.9)
Monocytes: 10 %
Neutrophils Absolute: 3.4 10*3/uL (ref 1.4–7.0)
Neutrophils: 49 %
Platelets: 281 10*3/uL (ref 150–450)
RBC: 3.79 x10E6/uL (ref 3.77–5.28)
RDW: 18.6 % — ABNORMAL HIGH (ref 11.7–15.4)
WBC: 6.7 10*3/uL (ref 3.4–10.8)

## 2023-09-03 LAB — VITAMIN D 25 HYDROXY (VIT D DEFICIENCY, FRACTURES): Vit D, 25-Hydroxy: 91.2 ng/mL (ref 30.0–100.0)

## 2023-09-03 LAB — COMPREHENSIVE METABOLIC PANEL WITH GFR
ALT: 17 IU/L (ref 0–32)
AST: 19 IU/L (ref 0–40)
Albumin: 3.9 g/dL (ref 3.9–4.9)
Alkaline Phosphatase: 107 IU/L (ref 44–121)
BUN/Creatinine Ratio: 14 (ref 9–23)
BUN: 15 mg/dL (ref 6–24)
Bilirubin Total: 0.6 mg/dL (ref 0.0–1.2)
CO2: 25 mmol/L (ref 20–29)
Calcium: 9 mg/dL (ref 8.7–10.2)
Chloride: 98 mmol/L (ref 96–106)
Creatinine, Ser: 1.04 mg/dL — ABNORMAL HIGH (ref 0.57–1.00)
Globulin, Total: 2.6 g/dL (ref 1.5–4.5)
Glucose: 80 mg/dL (ref 70–99)
Potassium: 3.1 mmol/L — ABNORMAL LOW (ref 3.5–5.2)
Sodium: 140 mmol/L (ref 134–144)
Total Protein: 6.5 g/dL (ref 6.0–8.5)
eGFR: 65 mL/min/{1.73_m2} (ref >59)

## 2023-09-03 LAB — B12 AND FOLATE PANEL
Folate: 2.3 ng/mL — ABNORMAL LOW (ref >3.0)
Vitamin B-12: 214 pg/mL — ABNORMAL LOW (ref 232–1245)

## 2023-09-03 MED ORDER — POTASSIUM CHLORIDE CRYS ER 20 MEQ PO TBCR: 20.0000 meq | EXTENDED_RELEASE_TABLET | Freq: Every day | ORAL | 0 refills | Status: DC

## 2023-09-03 NOTE — Telephone Encounter (Signed)
 Scheduled nurse visit for b12 injection

## 2023-09-04 ENCOUNTER — Ambulatory Visit: Admitting: Internal Medicine

## 2023-09-04 ENCOUNTER — Other Ambulatory Visit: Payer: Self-pay | Admitting: Neurology

## 2023-09-04 ENCOUNTER — Other Ambulatory Visit: Payer: Self-pay | Admitting: Internal Medicine

## 2023-09-04 DIAGNOSIS — D519 Vitamin B12 deficiency anemia, unspecified: Secondary | ICD-10-CM

## 2023-09-04 DIAGNOSIS — G35 Multiple sclerosis: Secondary | ICD-10-CM

## 2023-09-04 MED ORDER — CYANOCOBALAMIN 1000 MCG/ML IJ SOLN
1000.0000 ug | Freq: Once | INTRAMUSCULAR | Status: AC
  Administered 2023-09-04: 1000 ug via INTRAMUSCULAR

## 2023-09-07 ENCOUNTER — Telehealth: Payer: Self-pay | Admitting: Family Medicine

## 2023-09-07 NOTE — Telephone Encounter (Signed)
 Patient requested [facility] [place] no auth required sent to GI to schedule. [phone]

## 2023-09-08 NOTE — Telephone Encounter (Signed)
 Last seen on 09/02/23 Follow up scheduled on 04/12/24

## 2023-09-10 ENCOUNTER — Other Ambulatory Visit: Payer: Self-pay | Admitting: *Deleted

## 2023-09-10 DIAGNOSIS — G35 Multiple sclerosis: Secondary | ICD-10-CM

## 2023-09-10 NOTE — Telephone Encounter (Signed)
 Last seen on 09/02/23 Follow up scheduled on 04/12/24

## 2023-09-16 ENCOUNTER — Ambulatory Visit
Admission: RE | Admit: 2023-09-16 | Discharge: 2023-09-16 | Disposition: A | Discharge status: Home / Self Care | Source: Ambulatory Visit | Attending: Family Medicine | Admitting: Family Medicine

## 2023-09-16 DIAGNOSIS — G35 Multiple sclerosis: Secondary | ICD-10-CM

## 2023-09-16 MED ORDER — GADOPICLENOL 0.5 MMOL/ML IV SOLN
10.0000 mL | Freq: Once | INTRAVENOUS | Status: AC | PRN
  Administered 2023-09-16: 10 mL via INTRAVENOUS

## 2023-09-21 ENCOUNTER — Telehealth: Payer: Self-pay | Admitting: Family Medicine

## 2023-09-21 NOTE — Telephone Encounter (Signed)
 Referral for endocrinology fax to [facility]. Phone: [phone], Fax: [phone]

## 2023-09-28 ENCOUNTER — Encounter: Payer: Self-pay | Admitting: Family Medicine

## 2023-10-08 ENCOUNTER — Ambulatory Visit: Admitting: Internal Medicine

## 2023-10-08 DIAGNOSIS — D519 Vitamin B12 deficiency anemia, unspecified: Secondary | ICD-10-CM

## 2023-10-08 MED ORDER — CYANOCOBALAMIN 1000 MCG/ML IJ SOLN
1000.0000 ug | Freq: Once | INTRAMUSCULAR | Status: AC
  Administered 2023-10-08: 1000 ug via INTRAMUSCULAR

## 2023-10-14 ENCOUNTER — Other Ambulatory Visit: Payer: Self-pay | Admitting: Neurology

## 2023-10-20 ENCOUNTER — Ambulatory Visit: Admitting: Internal Medicine

## 2023-10-21 ENCOUNTER — Ambulatory Visit: Admitting: Internal Medicine

## 2023-10-21 ENCOUNTER — Other Ambulatory Visit: Payer: Self-pay | Admitting: Internal Medicine

## 2023-10-21 ENCOUNTER — Encounter: Payer: Self-pay | Admitting: Internal Medicine

## 2023-10-21 DIAGNOSIS — F418 Other specified anxiety disorders: Secondary | ICD-10-CM

## 2023-10-21 DIAGNOSIS — Z0130 Encounter for examination of blood pressure without abnormal findings: Secondary | ICD-10-CM

## 2023-10-21 MED ORDER — ZEPBOUND 7.5 MG/0.5ML ~~LOC~~ SOAJ
7.5000 mg | SUBCUTANEOUS | 1 refills | Status: DC
Start: 2023-10-21 — End: 2023-12-22

## 2023-10-21 NOTE — Progress Notes (Signed)
 Established Patient Office Visit  Subjective:  Patient ID: Sandra Bonilla, female    DOB: [birth year]  Age: 48 y.o. MRN: [id number]  Chief Complaint  Patient presents with   Follow-up    2 mo weight management    No new complaints, here for weight follow up. Continues to lose weight but c/o food cravings on current dose of glp-1.     No other concerns at this time.   Past Medical History:  Diagnosis Date   Anemia 05/2019   iron  infusions   Anxiety    Asthma    Cervical high risk human papillomavirus (HPV) DNA test positive 09/2015   Cholecystitis 2006   GALBLADDER REMOVED   Depression    Diverticulitis    Diverticulosis    Dysplastic nevus 08/24/2008   right upper back lat, right upper back medial   Dysplastic nevus 04/21/2022   upper back medial to middlescapula, mild atypia   Fibroadenoma 2016   RIGHT BREAST   GERD (gastroesophageal reflux disease)    Head injury 06/25/2016   History of mammogram 11/2014   FIBROADENOMA   History of Papanicolaou smear of cervix 01/24/13; 10/15/15   -/-; -/+   Hypertension    Menorrhagia 02/01/2010   D/C HYSTEROSCOPY ENDO POLYP   MS (multiple sclerosis) (HCC)    Neuromuscular disorder (HCC)    Ovarian cyst 9/15; 2015-2016   LEFT - WENT TO [facility]; RIGHT   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   Sleep apnea    WEAR C PAP    Past Surgical History:  Procedure Laterality Date   BIOPSY  03/21/2019   Procedure: BIOPSY;  Surgeon: Wilhelmenia Aloha Raddle., MD;  Location: [facility] ENDOSCOPY;  Service: Gastroenterology;;   BREAST BIOPSY Right 2016   Fibroadenoma   CHOLECYSTECTOMY  2006   COLON RESECTION N/A 08/11/2019   Procedure: DIAGNOSTIC LAPAROSCOPY WITH WASH OUT, DRAIN PLACEMENT, AND LOOP ILEOSTOMY CREATION;  Surgeon: Debby Hila, MD;  Location: [facility] ORS;  Service: General;  Laterality: N/A;   COLONOSCOPY WITH PROPOFOL  N/A 03/21/2019   Procedure: COLONOSCOPY WITH PROPOFOL ;  Surgeon: Wilhelmenia Aloha Raddle., MD;  Location:  [facility] ENDOSCOPY;  Service: Gastroenterology;  Laterality: N/A;  ultraslim colonoscope    CYSTOSCOPY WITH INJECTION N/A 08/04/2019   Procedure: FIREFLY INJECTION;  Surgeon: Cam Morene ORN, MD;  Location: [facility] ORS;  Service: Urology;  Laterality: N/A;   DILATION AND CURETTAGE, DIAGNOSTIC / THERAPEUTIC  02/01/2010   D/C HYSTEROSCOPY ENDO POLYP; [facility]   ENDOSCOPIC MUCOSAL RESECTION  03/21/2019   Procedure: ENDOSCOPIC MUCOSAL RESECTION;  Surgeon: Wilhelmenia Aloha Raddle., MD;  Location: [facility] ENDOSCOPY;  Service: Gastroenterology;;   ESOPHAGOGASTRODUODENOSCOPY (EGD) WITH PROPOFOL  N/A 03/21/2019   Procedure: ESOPHAGOGASTRODUODENOSCOPY (EGD) WITH PROPOFOL ;  Surgeon: Wilhelmenia Aloha Raddle., MD;  Location: [facility] ENDOSCOPY;  Service: Gastroenterology;  Laterality: N/A;   EYE SURGERY     Lasik   FLEXIBLE SIGMOIDOSCOPY  07/31/2016   Procedure: FLEXIBLE SIGMOIDOSCOPY;  Surgeon: Debby Hila, MD;  Location: [facility] ENDOSCOPY;  Service: Endoscopy;;   HEMOSTASIS CLIP PLACEMENT  03/21/2019   Procedure: HEMOSTASIS CLIP PLACEMENT;  Surgeon: Wilhelmenia Aloha Raddle., MD;  Location: [facility] ENDOSCOPY;  Service: Gastroenterology;;   HYSTEROSCOPY  02/01/2010   ENDO POLYP - [facility]   IR RADIOLOGIST EVAL & MGMT  11/16/2018   IR RADIOLOGIST EVAL & MGMT  09/06/2019   SCLEROTHERAPY  03/21/2019   Procedure: MATIAS;  Surgeon: Mansouraty, Aloha Raddle., MD;  Location: [facility] ENDOSCOPY;  Service: Gastroenterology;;    Social History   Socioeconomic History  Marital status: Divorced    Spouse name: Not on file   Number of children: 0   Years of education: 14   Highest education level: Not on file  Occupational History    Employer: [employer]  Tobacco Use   Smoking status: Former    Current packs/day: 0.00    Types: Cigarettes    Start date: 2000    Quit date: 2010    Years since quitting: 15.5   Smokeless tobacco: Never  Vaping Use   Vaping status: Never Used  Substance and Sexual Activity   Alcohol  use: No   Drug use: No    Sexual activity: Yes    Partners: Male    Birth control/protection: I.U.D.    Comment: Mirena   Other Topics Concern   Not on file  Social History Narrative   Not on file   Social Drivers of Health   Financial Resource Strain: Low Risk  (05/19/2023)   Received from [facility]   Overall Financial Resource Strain (CARDIA)    Difficulty of Paying Living Expenses: Not hard at all  Food Insecurity: No Food Insecurity (05/19/2023)   Received from [facility]   Hunger Vital Sign    Within the past 12 months, you worried that your food would run out before you got the money to buy more.: Never true    Within the past 12 months, the food you bought just didn't last and you didn't have money to get more.: Never true  Transportation Needs: No Transportation Needs (05/19/2023)   Received from [facility] - Transportation    In the past 12 months, has lack of transportation kept you from medical appointments or from getting medications?: No    Lack of Transportation (Non-Medical): No  Physical Activity: Not on file  Stress: Not on file  Social Connections: Not on file  Intimate Partner Violence: Not on file    Family History  Problem Relation Age of Onset   Rheum arthritis Mother    Hypertension Mother    Thyroid  disease Mother        HYPOTHYROIDISM   Lung cancer Mother 65   Heart disease Father    Diabetes Father    Hypertension Father    Breast cancer Maternal Aunt 68       has contact   Cancer Paternal Grandmother        MELANOMA OF SKIN   Cancer Cousin        KIDNEY-COUSIN   Colon cancer Neg Hx    Stomach cancer Neg Hx    Pancreatic cancer Neg Hx    Esophageal cancer Neg Hx    Inflammatory bowel disease Neg Hx    Rectal cancer Neg Hx    Colon polyps Neg Hx    Crohn'L[place]oy [place] disease Neg Hx    Ulcerative colitis Neg Hx    Multiple sclerosis Neg Hx     Allergies  Allergen Reactions   Sulfamethoxazole-Trimethoprim  Rash and Hives   Sulfa Antibiotics Other (See Comments) and Rash    Outpatient Medications Prior to Visit  Medication Sig   acetaminophen  (TYLENOL ) 325 MG tablet Take 2 tablets (650 mg total) by mouth every 6 (six) hours as needed for mild pain (or Fever >/= 101).   busPIRone  (BUSPAR ) 15 MG tablet Take 1 tablet (15 mg total) by mouth 2 (two) times daily.   chlorthalidone  (HYGROTON ) 25 MG tablet Take 1 tablet (25 mg total) by mouth daily.  cholecalciferol  (VITAMIN D3) 25 MCG (1000 UT) tablet Take 1,000 Units by mouth daily.   cyclobenzaprine  (FLEXERIL ) 5 MG tablet Take 1 tablet (5 mg total) by mouth at bedtime as needed. (Patient taking differently: Take 5 mg by mouth at bedtime as needed for muscle spasms.)   esomeprazole  (NEXIUM ) 40 MG capsule Take 1 capsule (40 mg total) by mouth daily.   FLUoxetine  (PROZAC ) 10 MG capsule TAKE 1 CAPSULE(10 MG) BY MOUTH DAILY   fluticasone  (FLONASE) 50 MCG/ACT nasal spray Place into the nose.   gabapentin  (NEURONTIN ) 300 MG capsule TAKE 1 CAPSULE BY MOUTH IN THE  MORNING , 1 CAPSULE IN THE  EVENING AND 2 CAPSULES AT  BEDTIME   levonorgestrel  (MIRENA ) 20 MCG/DAY IUD 1 each by Intrauterine route once.   lidocaine  (LIDODERM ) 5 % Place 1 patch onto the skin daily. Remove & Discard patch within 12 hours or as directed by MD (Patient taking differently: Place 1 patch onto the skin as needed. Remove & Discard patch within 12 hours or as directed by MD)   LORazepam  (ATIVAN ) 1 MG tablet Take 1 tablet (1 mg total) by mouth daily as needed. for anxiety   mirabegron  ER (MYRBETRIQ ) 25 MG TB24 tablet Take 1 tablet (25 mg total) by mouth daily.   modafinil  (PROVIGIL ) 200 MG tablet Take 1 tablet (200 mg total) by mouth daily.   mometasone  (ELOCON ) 0.1 % cream Apply 1 application topically daily as needed (Rash). Up to 5 times per week   ondansetron  (ZOFRAN -ODT) 4 MG disintegrating tablet Take 4 mg by mouth every 6 (six) hours as needed for nausea or vomiting.    Polyvinyl  Alcohol -Povidone (REFRESH OP) Place 2 drops into both eyes 4 (four) times daily as needed (dryness).   potassium chloride  SA (KLOR-CON  M) 20 MEQ tablet Take 1 tablet (20 mEq total) by mouth daily.   Probiotic Product (PROBIOTIC PO) Take 1 capsule by mouth daily.   Teriflunomide  14 MG TABS TAKE 1 TABLET BY MOUTH DAILY   TRELEGY ELLIPTA 200-62.5-25 MCG/ACT AEPB Inhale 1 puff into the lungs daily.   [DISCONTINUED] tirzepatide  (ZEPBOUND ) 5 MG/0.5ML Pen ADMINISTER 5 MG UNDER THE SKIN 1 TIME A WEEK   No facility-administered medications prior to visit.    Review of Systems  Constitutional:  Positive for weight loss (9 lbs). Negative for malaise/fatigue.  HENT: Negative.    Eyes: Negative.   Respiratory: Negative.    Cardiovascular: Negative.   Gastrointestinal: Negative.   Genitourinary: Negative.   Skin: Negative.   Neurological: Negative.   Endo/Heme/Allergies: Negative.   Psychiatric/Behavioral:  The patient does not have insomnia (daytime somnolence).        Objective:   BP 118/80   Pulse (!) 104   Temp 98.2 F (36.8 C)   Ht 5' 6 (1.676 m)   Wt 220 lb 9.6 oz (100.1 kg)   SpO2 99%   BMI 35.61 kg/m   Vitals:   10/21/23 1136  BP: 118/80  Pulse: (!) 104  Temp: 98.2 F (36.8 C)  Height: 5' 6 (1.676 m)  Weight: 220 lb 9.6 oz (100.1 kg)  SpO2: 99%  BMI (Calculated): 35.62    Physical Exam Vitals reviewed.  Constitutional:      General: She is not in acute distress.    Appearance: She is obese.  HENT:     Head: Normocephalic.     Nose: Nose normal.     Mouth/Throat:     Mouth: Mucous membranes are moist.  Eyes:  Extraocular Movements: Extraocular movements intact.     Pupils: Pupils are equal, round, and reactive to light.  Cardiovascular:     Rate and Rhythm: Normal rate and regular rhythm.     Heart sounds: No murmur heard. Pulmonary:     Effort: Pulmonary effort is normal.     Breath sounds: No rhonchi or rales.  Abdominal:     General: Abdomen is  flat.     Palpations: There is no hepatomegaly, splenomegaly or mass.  Musculoskeletal:        General: Normal range of motion.     Cervical back: Normal range of motion. No tenderness.  Skin:    General: Skin is warm and dry.  Neurological:     General: No focal deficit present.     Mental Status: She is alert and oriented to person, place, and time.     Cranial Nerves: No cranial nerve deficit.     Motor: No weakness.  Psychiatric:        Mood and Affect: Mood normal.        Behavior: Behavior normal.      No results found for any visits on 10/21/23.      Assessment & Plan:  As per problem list  Problem List Items Addressed This Visit       Other   Morbid obesity (HCC) - Primary   Relevant Medications   tirzepatide  (ZEPBOUND ) 7.5 MG/0.5ML Pen    Return in about 2 months (around 12/22/2023) for Weight management.   Total time spent: 20 minutes  Sherrill Cinderella Perry, MD  10/21/2023   This document may have been prepared by [facility] Voice Recognition software and as such may include unintentional dictation errors.

## 2023-10-22 ENCOUNTER — Other Ambulatory Visit: Payer: Self-pay | Admitting: Internal Medicine

## 2023-10-22 DIAGNOSIS — F419 Anxiety disorder, unspecified: Secondary | ICD-10-CM

## 2023-10-28 ENCOUNTER — Encounter: Payer: Self-pay | Admitting: Internal Medicine

## 2023-10-28 ENCOUNTER — Other Ambulatory Visit: Payer: Self-pay

## 2023-10-28 ENCOUNTER — Telehealth: Payer: Self-pay

## 2023-10-28 DIAGNOSIS — F419 Anxiety disorder, unspecified: Secondary | ICD-10-CM

## 2023-10-28 MED ORDER — LORAZEPAM 1 MG PO TABS: 1.0000 mg | ORAL_TABLET | Freq: Every day | ORAL | 0 refills | Status: DC | PRN

## 2023-10-28 NOTE — Telephone Encounter (Signed)
 Pt uses mail order usually but is leaving for vacation and her refills will not be delivered in time. Pt is requesting her lorazepam  1mg  be sent to [facility] instead. I put in a refill request with this with the correct pharmacy.

## 2023-11-10 ENCOUNTER — Other Ambulatory Visit: Payer: Self-pay | Admitting: *Deleted

## 2023-11-10 ENCOUNTER — Encounter: Payer: Self-pay | Admitting: Family Medicine

## 2023-11-10 DIAGNOSIS — E042 Nontoxic multinodular goiter: Secondary | ICD-10-CM

## 2023-11-10 NOTE — Telephone Encounter (Signed)
 Reviewed pt chart. AL,NP placed order for US  thyroid  09/21/23 but I see no notes on appt desk. I asked Tori to f/u on this. Waiting on response.

## 2023-11-10 NOTE — Telephone Encounter (Signed)
 Per Metta, preferred location chosen on order: internal. It needs to have specific location selected in order for it to drop on that que for them to call pt and schedule. I updated order for pt to go to specific location

## 2023-11-11 ENCOUNTER — Ambulatory Visit
Admission: RE | Admit: 2023-11-11 | Discharge: 2023-11-11 | Disposition: A | Discharge status: Home / Self Care | Source: Ambulatory Visit | Attending: Family Medicine | Admitting: Family Medicine

## 2023-11-11 DIAGNOSIS — E042 Nontoxic multinodular goiter: Secondary | ICD-10-CM

## 2023-11-12 ENCOUNTER — Ambulatory Visit (INDEPENDENT_AMBULATORY_CARE_PROVIDER_SITE_OTHER): Admitting: Internal Medicine

## 2023-11-12 ENCOUNTER — Ambulatory Visit

## 2023-11-12 ENCOUNTER — Encounter: Payer: Self-pay | Admitting: Internal Medicine

## 2023-11-12 DIAGNOSIS — D519 Vitamin B12 deficiency anemia, unspecified: Secondary | ICD-10-CM

## 2023-11-12 MED ORDER — CYANOCOBALAMIN 1000 MCG/ML IJ SOLN
1000.0000 ug | Freq: Once | INTRAMUSCULAR | Status: AC
  Administered 2023-11-12: 1000 ug via INTRAMUSCULAR

## 2023-11-13 ENCOUNTER — Encounter: Payer: Self-pay | Admitting: Gastroenterology

## 2023-11-13 ENCOUNTER — Telehealth: Payer: Self-pay

## 2023-11-13 ENCOUNTER — Ambulatory Visit: Admitting: Gastroenterology

## 2023-11-13 ENCOUNTER — Other Ambulatory Visit (HOSPITAL_COMMUNITY): Payer: Self-pay

## 2023-11-13 VITALS — BP 124/80 | HR 94 | Ht 66.5 in | Wt 216.2 lb

## 2023-11-13 DIAGNOSIS — K5900 Constipation, unspecified: Secondary | ICD-10-CM | POA: Diagnosis not present

## 2023-11-13 DIAGNOSIS — E538 Deficiency of other specified B group vitamins: Secondary | ICD-10-CM | POA: Diagnosis not present

## 2023-11-13 DIAGNOSIS — R748 Abnormal levels of other serum enzymes: Secondary | ICD-10-CM | POA: Diagnosis not present

## 2023-11-13 DIAGNOSIS — R140 Abdominal distension (gaseous): Secondary | ICD-10-CM | POA: Diagnosis not present

## 2023-11-13 DIAGNOSIS — Z8719 Personal history of other diseases of the digestive system: Secondary | ICD-10-CM

## 2023-11-13 MED ORDER — ESOMEPRAZOLE MAGNESIUM 40 MG PO CPDR: 40.0000 mg | DELAYED_RELEASE_CAPSULE | Freq: Every day | ORAL | 4 refills | Status: DC

## 2023-11-13 NOTE — Telephone Encounter (Signed)
 Pharmacy Patient Advocate Encounter  Received notification from [employer] that Prior Authorization for Teriflunomide  has been APPROVED from 11/13/2023 to 03/30/2038   PA #/Case ID/Reference #: [id number]

## 2023-11-13 NOTE — Telephone Encounter (Signed)
 Pharmacy Patient Advocate Encounter   Received notification from Fax that prior authorization for Teriflunomide  14mg  Tablets is required/requested.   Insurance verification completed.   The patient is insured through [facility] .   Per test claim: PA required; PA submitted to above mentioned insurance via Latent Key/confirmation #/EOC [id number] Status is pending

## 2023-11-13 NOTE — Progress Notes (Signed)
 GASTROENTEROLOGY OUTPATIENT CLINIC VISIT   Primary Care Provider Sandra GORMAN Dine, MD [address] [place] [address] [phone]  Patient Profile: Sandra Bonilla is a 48 y.o. female with a pmh significant for anxiety, depression, multiple sclerosis, status post cholecystectomy, hypertension, obesity, GERD, complicated diverticulitis (status post [place] with reversal), colon polyps (history advanced rectal TVA post resection).  The patient presents to the [facility] for an evaluation and management of problem(s) noted below:  Problem List 1. History of gastroesophageal reflux (GERD)   2. Constipation, unspecified constipation type   3. Abdominal bloating   4. B12 deficiency   5. Folate deficiency   6. Elevated alkaline phosphatase level    Discussed the use of AI scribe software for clinical note transcription with the patient, who gave verbal consent to proceed.  History of Present Illness Please see prior notes for full details of HPI.  Interval History Sandra Bonilla is a 48 year old female who presents for follow-up.  She has experienced significant weight loss since initiation of Zepbound  (nearly 40 pounds).  She feels her GERD symptoms are well controlled on current PPI dosing.  She has dealt with issues of alternating bowel habits in the past.  With recent stoppage of Probiotic/Fiber, things are slightly worse.  She does describe s quick gastrocolic reflex.  Last colonoscopy in 2023 with plan for 5-year follow-up.  In June, her neurologist identified low B12 and folate levels.  She is receiving monthly B12 injections but is not taking Folic acid  at this time.  Her prior EGD showed gastritis but not overt autoimmune gastritis.  She recently underwent a thyroid  ultrasound after MRI scans for her MS revealed thyroid  nodules for which she is with care under an endocrinologist.  No other significant GI issues at play.   GI Review of  Systems Positive as above Negative for early satiety, melena  Review of Systems General: Denies fevers/chills/unintentional weight loss Cardiovascular: Denies chest pain Pulmonary: No significant shortness of breath Gastroenterological: See HPI Genitourinary: Denies darkened urine Hematological: Denies easy bruising/bleeding Dermatological: Denies jaundice Psychological: Mood is stable   Medications Current Outpatient Medications  Medication Sig Dispense Refill   acetaminophen  (TYLENOL ) 325 MG tablet Take 2 tablets (650 mg total) by mouth every 6 (six) hours as needed for mild pain (or Fever >/= 101).     busPIRone  (BUSPAR ) 15 MG tablet TAKE 1 TABLET BY MOUTH TWICE DAILY 180 tablet 1   chlorthalidone  (HYGROTON ) 25 MG tablet Take 1 tablet (25 mg total) by mouth daily. 90 tablet 1   cholecalciferol  (VITAMIN D3) 25 MCG (1000 UT) tablet Take 1,000 Units by mouth daily.     cyclobenzaprine  (FLEXERIL ) 5 MG tablet Take 1 tablet (5 mg total) by mouth at bedtime as needed. (Patient taking differently: Take 5 mg by mouth at bedtime as needed for muscle spasms.) 30 tablet 11   FLUoxetine  (PROZAC ) 10 MG capsule TAKE 1 CAPSULE(10 MG) BY MOUTH DAILY 90 capsule 1   fluticasone  (FLONASE) 50 MCG/ACT nasal spray Place into the nose.     gabapentin  (NEURONTIN ) 300 MG capsule TAKE 1 CAPSULE BY MOUTH IN THE  MORNING , 1 CAPSULE IN THE  EVENING AND 2 CAPSULES AT  BEDTIME 360 capsule 1   levonorgestrel  (MIRENA ) 20 MCG/DAY IUD 1 each by Intrauterine route once.     lidocaine  (LIDODERM ) 5 % Place 1 patch onto the skin daily. Remove & Discard patch within 12 hours or as directed by MD (Patient taking differently: Place 1  patch onto the skin as needed. Remove & Discard patch within 12 hours or as directed by MD) 90 patch 0   LORazepam  (ATIVAN ) 1 MG tablet Take 1 tablet (1 mg total) by mouth daily as needed. for anxiety 90 tablet 0   mirabegron  ER (MYRBETRIQ ) 25 MG TB24 tablet Take 1 tablet (25 mg total) by mouth  daily. 90 tablet 1   modafinil  (PROVIGIL ) 200 MG tablet Take 1 tablet (200 mg total) by mouth daily. 30 tablet 5   mometasone  (ELOCON ) 0.1 % cream Apply 1 application topically daily as needed (Rash). Up to 5 times per week 45 g 1   ondansetron  (ZOFRAN -ODT) 4 MG disintegrating tablet Take 4 mg by mouth every 6 (six) hours as needed for nausea or vomiting.      Polyvinyl Alcohol -Povidone (REFRESH OP) Place 2 drops into both eyes 4 (four) times daily as needed (dryness).     potassium chloride  SA (KLOR-CON  M) 20 MEQ tablet Take 1 tablet (20 mEq total) by mouth daily. 90 tablet 0   Probiotic Product (PROBIOTIC PO) Take 1 capsule by mouth daily.     Teriflunomide  14 MG TABS TAKE 1 TABLET BY MOUTH DAILY 30 tablet 7   tirzepatide  (ZEPBOUND ) 7.5 MG/0.5ML Pen Inject 7.5 mg into the skin once a week. 2 mL 1   TRELEGY ELLIPTA 200-62.5-25 MCG/ACT AEPB Inhale 1 puff into the lungs daily.     esomeprazole  (NEXIUM ) 40 MG capsule Take 1 capsule (40 mg total) by mouth daily. 90 capsule 4   No current facility-administered medications for this visit.    Allergies Allergies  Allergen Reactions   Sulfamethoxazole-Trimethoprim Rash and Hives   Sulfa Antibiotics Other (See Comments) and Rash    Histories Past Medical History:  Diagnosis Date   Anemia 05/2019   iron  infusions   Anxiety    Asthma    Cervical high risk human papillomavirus (HPV) DNA test positive 09/2015   Cholecystitis 2006   GALBLADDER REMOVED   Depression    Diverticulitis    Diverticulosis    Dysplastic nevus 08/24/2008   right upper back lat, right upper back medial   Dysplastic nevus 04/21/2022   upper back medial to middlescapula, mild atypia   Fibroadenoma 2016   RIGHT BREAST   GERD (gastroesophageal reflux disease)    Head injury 06/25/2016   History of mammogram 11/2014   FIBROADENOMA   History of Papanicolaou smear of cervix 01/24/13; 10/15/15   -/-; -/+   Hypertension    Menorrhagia 02/01/2010   D/C HYSTEROSCOPY  ENDO POLYP   MS (multiple sclerosis) (HCC)    Neuromuscular disorder (HCC)    Ovarian cyst 9/15; 2015-2016   LEFT - WENT TO [place]; RIGHT   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   Sleep apnea    WEAR C PAP   Past Surgical History:  Procedure Laterality Date   BIOPSY  03/21/2019   Procedure: BIOPSY;  Surgeon: Wilhelmenia Aloha Raddle., MD;  Location: [facility] ENDOSCOPY;  Service: Gastroenterology;;   BREAST BIOPSY Right 2016   Fibroadenoma   CHOLECYSTECTOMY  2006   COLON RESECTION N/A 08/11/2019   Procedure: DIAGNOSTIC LAPAROSCOPY WITH WASH OUT, DRAIN PLACEMENT, AND LOOP ILEOSTOMY CREATION;  Surgeon: Debby Hila, MD;  Location: [facility] ORS;  Service: General;  Laterality: N/A;   COLONOSCOPY WITH PROPOFOL  N/A 03/21/2019   Procedure: COLONOSCOPY WITH PROPOFOL ;  Surgeon: Wilhelmenia Aloha Raddle., MD;  Location: [facility] ENDOSCOPY;  Service: Gastroenterology;  Laterality: N/A;  ultraslim colonoscope    CYSTOSCOPY WITH  INJECTION N/A 08/04/2019   Procedure: FIREFLY INJECTION;  Surgeon: Cam Morene ORN, MD;  Location: [facility] ORS;  Service: Urology;  Laterality: N/A;   DILATION AND CURETTAGE, DIAGNOSTIC / THERAPEUTIC  02/01/2010   D/C HYSTEROSCOPY ENDO POLYP; [facility]   ENDOSCOPIC MUCOSAL RESECTION  03/21/2019   Procedure: ENDOSCOPIC MUCOSAL RESECTION;  Surgeon: Wilhelmenia Aloha Raddle., MD;  Location: [facility] ENDOSCOPY;  Service: Gastroenterology;;   ESOPHAGOGASTRODUODENOSCOPY (EGD) WITH PROPOFOL  N/A 03/21/2019   Procedure: ESOPHAGOGASTRODUODENOSCOPY (EGD) WITH PROPOFOL ;  Surgeon: Wilhelmenia Aloha Raddle., MD;  Location: [facility] ENDOSCOPY;  Service: Gastroenterology;  Laterality: N/A;   EYE SURGERY     Lasik   FLEXIBLE SIGMOIDOSCOPY  07/31/2016   Procedure: FLEXIBLE SIGMOIDOSCOPY;  Surgeon: Debby Hila, MD;  Location: [facility] ENDOSCOPY;  Service: Endoscopy;;   HEMOSTASIS CLIP PLACEMENT  03/21/2019   Procedure: HEMOSTASIS CLIP PLACEMENT;  Surgeon: Wilhelmenia Aloha Raddle., MD;  Location: [facility] ENDOSCOPY;  Service:  Gastroenterology;;   HYSTEROSCOPY  02/01/2010   ENDO POLYP - [facility]   IR RADIOLOGIST EVAL & MGMT  11/16/2018   IR RADIOLOGIST EVAL & MGMT  09/06/2019   SCLEROTHERAPY  03/21/2019   Procedure: MATIAS;  Surgeon: Mansouraty, Aloha Raddle., MD;  Location: [facility] ENDOSCOPY;  Service: Gastroenterology;;   Social History   Socioeconomic History   Marital status: Divorced    Spouse name: Not on file   Number of children: 0   Years of education: 14   Highest education level: Not on file  Occupational History    Employer: [employer]  Tobacco Use   Smoking status: Former    Current packs/day: 0.00    Types: Cigarettes    Start date: 2000    Quit date: 2010    Years since quitting: 15.6   Smokeless tobacco: Never  Vaping Use   Vaping status: Never Used  Substance and Sexual Activity   Alcohol  use: No   Drug use: No   Sexual activity: Yes    Partners: Male    Birth control/protection: I.U.D.    Comment: Mirena   Other Topics Concern   Not on file  Social History Narrative   Not on file   Social Drivers of Health   Financial Resource Strain: Low Risk  (05/19/2023)   Received from [facility]   Overall Financial Resource Strain (CARDIA)    Difficulty of Paying Living Expenses: Not hard at all  Food Insecurity: No Food Insecurity (05/19/2023)   Received from [facility]   Hunger Vital Sign    Within the past 12 months, you worried that your food would run out before you got the money to buy more.: Never true    Within the past 12 months, the food you bought just didn't last and you didn't have money to get more.: Never true  Transportation Needs: No Transportation Needs (05/19/2023)   Received from [facility] - Transportation    In the past 12 months, has lack of transportation kept you from medical appointments or from getting medications?: No    Lack of Transportation (Non-Medical): No  Physical Activity: Not on file   Stress: Not on file  Social Connections: Not on file  Intimate Partner Violence: Not on file   Family History  Problem Relation Age of Onset   Rheum arthritis Mother    Hypertension Mother    Thyroid  disease Mother        HYPOTHYROIDISM   Lung cancer Mother 66   Heart disease Father    Diabetes  Father    Hypertension Father    Breast cancer Maternal Aunt 34       has contact   Cancer Paternal Grandmother        MELANOMA OF SKIN   Cancer Cousin        KIDNEY-COUSIN   Colon cancer Neg Hx    Stomach cancer Neg Hx    Pancreatic cancer Neg Hx    Esophageal cancer Neg Hx    Inflammatory bowel disease Neg Hx    Rectal cancer Neg Hx    Colon polyps Neg Hx    Crohn's disease Neg Hx    Ulcerative colitis Neg Hx    Multiple sclerosis Neg Hx    I have reviewed her medical, social, and family history in detail and updated the electronic medical record as necessary.    PHYSICAL EXAMINATION  BP 124/80   Pulse 94   Ht 5' 6.5 (1.689 m)   Wt 216 lb 4 oz (98.1 kg)   BMI 34.38 kg/m  Wt Readings from Last 3 Encounters:  11/13/23 216 lb 4 oz (98.1 kg)  10/21/23 220 lb 9.6 oz (100.1 kg)  09/02/23 230 lb 12.8 oz (104.7 kg)  GEN: NAD, appears stated age, doesn't appear chronically ill PSYCH: Cooperative, without pressured speech EYE: Conjunctivae pink, sclerae anicteric ENT: MMM CV: Nontachycardic RESP: No audible wheezing GI: NABS, soft, nontender, rounded, without rebound MSK/EXT: No lower extremity edema SKIN: No jaundice NEURO:  Alert & Oriented x 3, no focal deficits   REVIEW OF DATA  I reviewed the following data at the time of this encounter:  GI Procedures and Studies  May 2024 EGD - No gross lesions in the entire esophagus (previous esophagitis is healed). Z-line irregular, 36 cm from the incisors. - 1 cm hiatal hernia. - Erosive gastropathy within the antrum, with no bleeding and no stigmata of recent bleeding. - No other gross lesions in the entire stomach.  Biopsied. - No gross lesions in the duodenal bulb, in the first portion of the duodenum and in the second portion of the duodenum.  Laboratory Studies  Reviewed those in epic  Imaging Studies  No new imaging studies to review   ASSESSMENT  Ms. Momon is a 48 y.o. female.  The patient is seen today for evaluation and management of:  1. History of gastroesophageal reflux (GERD)   2. Constipation, unspecified constipation type   3. Abdominal bloating   4. B12 deficiency   5. Folate deficiency   6. Elevated alkaline phosphatase level    The patient is clinically and hemodynamically stable at this time.  We will continue current PPI dosing.  For constipation, expect that this is partially result of her GLP.  She will add fiber back into her diet and potentially probiotic to see if that will be helpful.  Fiber supplementation is also recommended.  Can consider additional therapies in the future if this continues to be bothersome for her at times.  Her B12 and folate levels were low, want to keep in mind her chronic PPI use that could be exacerbating this.  Will rule out autoimmune gastritis as well as pernicious anemia with laboratories.  For now continue PPI as noted above.  All patient questions were answered to the best of my ability, and the patient agrees to the aforementioned plan of action with follow-up as indicated.   PLAN  Continue PPI Laboratories as outlined below to be drawn in 2 months Follow-up GGT and alk phos, if still  elevated, can consider additional workup and imaging Continue B12 supplementation Folate to be restarted   Orders Placed This Encounter  Procedures   CBC   Hepatic function panel   B12 and Folate Panel   Intrinsic Factor Antibodies   Anti-parietal antibody   Gamma GT    New Prescriptions   No medications on file   Modified Medications   Modified Medication Previous Medication   ESOMEPRAZOLE  (NEXIUM ) 40 MG CAPSULE esomeprazole  (NEXIUM ) 40 MG  capsule      Take 1 capsule (40 mg total) by mouth daily.    Take 1 capsule (40 mg total) by mouth daily.    Planned Follow Up Return in about 1 year (around 11/12/2024).   Total Time in Face-to-Face and in Coordination of Care for patient including independent/personal interpretation/review of prior testing, medical history, examination, medication adjustment, communicating results with the patient directly, and documentation with the EHR is 25 minutes.   Aloha Finner, MD [facility] Office # [phone]

## 2023-11-13 NOTE — Patient Instructions (Signed)
 Please purchase the following medications over the counter and take as directed: Fiber Con once daily.   Your provider has requested that you go to the basement level for lab work in 2 months at [facility]. Please have them fax results to [phone] attn: Dr Wilhelmenia .  Follow-up 1 year. Sooner if needed.   Due to recent changes in healthcare laws, you may see the results of your imaging and laboratory studies on MyChart before your provider has had a chance to review them.  We understand that in some cases there may be results that are confusing or concerning to you. Not all laboratory results come back in the same time frame and the provider may be waiting for multiple results in order to interpret others.  Please give us  48 hours in order for your provider to thoroughly review all the results before contacting the office for clarification of your results.   We have sent the following medications to your pharmacy for you to pick up at your convenience: Omeprazole    _______________________________________________________  If your blood pressure at your visit was 140/90 or greater, please contact your primary care physician to follow up on this.  _______________________________________________________  If you are age 47 or older, your body mass index should be between 23-30. Your Body mass index is 34.38 kg/m. If this is out of the aforementioned range listed, please consider follow up with your Primary Care Provider.  If you are age 74 or younger, your body mass index should be between 19-25. Your Body mass index is 34.38 kg/m. If this is out of the aformentioned range listed, please consider follow up with your Primary Care Provider.   ________________________________________________________  The [facility] providers would like to encourage you to use MYCHART to communicate with providers for non-urgent requests or questions.  Due to long hold times on the telephone, sending your  provider a message by [facility] may be a faster and more efficient way to get a response.  Please allow 48 business hours for a response.  Please remember that this is for non-urgent requests.  _______________________________________________________  [facility] is using a team-based approach to care.  Your team is made up of your doctor and two to three APPS. Our APPS (Nurse Practitioners and Physician Assistants) work with your physician to ensure care continuity for you. They are fully qualified to address your health concerns and develop a treatment plan. They communicate directly with your gastroenterologist to care for you. Seeing the Advanced Practice Practitioners on your physician's team can help you by facilitating care more promptly, often allowing for earlier appointments, access to diagnostic testing, procedures, and other specialty referrals.   Thank you for choosing me and [facility].  Dr. Wilhelmenia

## 2023-11-14 ENCOUNTER — Encounter: Payer: Self-pay | Admitting: Gastroenterology

## 2023-11-14 DIAGNOSIS — R748 Abnormal levels of other serum enzymes: Secondary | ICD-10-CM | POA: Insufficient documentation

## 2023-11-14 DIAGNOSIS — K5900 Constipation, unspecified: Secondary | ICD-10-CM | POA: Insufficient documentation

## 2023-11-14 DIAGNOSIS — Z8719 Personal history of other diseases of the digestive system: Secondary | ICD-10-CM | POA: Insufficient documentation

## 2023-11-14 DIAGNOSIS — E538 Deficiency of other specified B group vitamins: Secondary | ICD-10-CM | POA: Insufficient documentation

## 2023-11-17 ENCOUNTER — Ambulatory Visit: Payer: Self-pay | Admitting: Family Medicine

## 2023-11-17 ENCOUNTER — Telehealth: Payer: Self-pay

## 2023-11-17 NOTE — Telephone Encounter (Signed)
 Faxed pt Labs to [facility] [phone]

## 2023-11-19 ENCOUNTER — Other Ambulatory Visit: Payer: Self-pay | Admitting: Internal Medicine

## 2023-11-19 DIAGNOSIS — F419 Anxiety disorder, unspecified: Secondary | ICD-10-CM

## 2023-11-30 ENCOUNTER — Other Ambulatory Visit: Payer: Self-pay | Admitting: Internal Medicine

## 2023-11-30 DIAGNOSIS — I10 Essential (primary) hypertension: Secondary | ICD-10-CM

## 2023-12-01 ENCOUNTER — Other Ambulatory Visit (HOSPITAL_COMMUNITY): Payer: Self-pay

## 2023-12-02 ENCOUNTER — Encounter: Payer: Self-pay | Admitting: Gastroenterology

## 2023-12-10 ENCOUNTER — Ambulatory Visit: Admitting: Internal Medicine

## 2023-12-10 DIAGNOSIS — D519 Vitamin B12 deficiency anemia, unspecified: Secondary | ICD-10-CM

## 2023-12-10 MED ORDER — CYANOCOBALAMIN 1000 MCG/ML IJ SOLN
1000.0000 ug | Freq: Once | INTRAMUSCULAR | Status: AC
  Administered 2023-12-10: 1000 ug via INTRAMUSCULAR

## 2023-12-14 ENCOUNTER — Telehealth: Payer: Self-pay | Admitting: *Deleted

## 2023-12-14 DIAGNOSIS — G35 Multiple sclerosis: Secondary | ICD-10-CM

## 2023-12-14 MED ORDER — TERIFLUNOMIDE 14 MG PO TABS: 1.0000 | ORAL_TABLET | Freq: Every day | ORAL | 5 refills | Status: AC

## 2023-12-14 NOTE — Telephone Encounter (Signed)
 Received fax below. E-scribed rx to [facility].

## 2023-12-22 ENCOUNTER — Ambulatory Visit: Admitting: Internal Medicine

## 2023-12-22 ENCOUNTER — Encounter: Payer: Self-pay | Admitting: Internal Medicine

## 2023-12-22 DIAGNOSIS — F419 Anxiety disorder, unspecified: Secondary | ICD-10-CM

## 2023-12-22 DIAGNOSIS — R7303 Prediabetes: Secondary | ICD-10-CM | POA: Diagnosis not present

## 2023-12-22 DIAGNOSIS — I10 Essential (primary) hypertension: Secondary | ICD-10-CM

## 2023-12-22 MED ORDER — TIRZEPATIDE 10 MG/0.5ML ~~LOC~~ SOAJ: 10.0000 mg | SUBCUTANEOUS | 1 refills | Status: DC

## 2023-12-22 NOTE — Progress Notes (Signed)
 Established Patient Office Visit  Subjective:  Patient ID: Sandra Bonilla, female    DOB: Feb 16, 1976  Age: 48 y.o. MRN: [id number]  Chief Complaint  Patient presents with   Follow-up    2 month follow up    No new complaints, here for weight management. Lost another 6 lbs with diet and exercise. Reports worsening anxiety with stressful situations.     No other concerns at this time.   Past Medical History:  Diagnosis Date   Anemia 05/2019   iron  infusions   Anxiety    Asthma    Cervical high risk human papillomavirus (HPV) DNA test positive 09/2015   Cholecystitis 2006   GALBLADDER REMOVED   Depression    Diverticulitis    Diverticulosis    Dysplastic nevus 08/24/2008   right upper back lat, right upper back medial   Dysplastic nevus 04/21/2022   upper back medial to middlescapula, mild atypia   Fibroadenoma 2016   RIGHT BREAST   GERD (gastroesophageal reflux disease)    Head injury 06/25/2016   History of mammogram 11/2014   FIBROADENOMA   History of Papanicolaou smear of cervix 01/24/13; 10/15/15   -/-; -/+   Hypertension    Menorrhagia 02/01/2010   D/C HYSTEROSCOPY ENDO POLYP   MS (multiple sclerosis)    Neuromuscular disorder (HCC)    Ovarian cyst 9/15; 2015-2016   LEFT - WENT TO [place]; RIGHT   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   Sleep apnea    WEAR C PAP    Past Surgical History:  Procedure Laterality Date   BIOPSY  03/21/2019   Procedure: BIOPSY;  Surgeon: Wilhelmenia Aloha Raddle., MD;  Location: [facility] ENDOSCOPY;  Service: Gastroenterology;;   BREAST BIOPSY Right 2016   Fibroadenoma   CHOLECYSTECTOMY  2006   COLON RESECTION N/A 08/11/2019   Procedure: DIAGNOSTIC LAPAROSCOPY WITH WASH OUT, DRAIN PLACEMENT, AND LOOP ILEOSTOMY CREATION;  Surgeon: Debby Hila, MD;  Location: [facility] ORS;  Service: General;  Laterality: N/A;   COLONOSCOPY WITH PROPOFOL  N/A 03/21/2019   Procedure: COLONOSCOPY WITH PROPOFOL ;  Surgeon: Wilhelmenia Aloha Raddle.,  MD;  Location: [facility] ENDOSCOPY;  Service: Gastroenterology;  Laterality: N/A;  ultraslim colonoscope    CYSTOSCOPY WITH INJECTION N/A 08/04/2019   Procedure: FIREFLY INJECTION;  Surgeon: Cam Morene ORN, MD;  Location: [facility] ORS;  Service: Urology;  Laterality: N/A;   DILATION AND CURETTAGE, DIAGNOSTIC / THERAPEUTIC  02/01/2010   D/C HYSTEROSCOPY ENDO POLYP; [facility]   ENDOSCOPIC MUCOSAL RESECTION  03/21/2019   Procedure: ENDOSCOPIC MUCOSAL RESECTION;  Surgeon: Wilhelmenia Aloha Raddle., MD;  Location: [facility] ENDOSCOPY;  Service: Gastroenterology;;   ESOPHAGOGASTRODUODENOSCOPY (EGD) WITH PROPOFOL  N/A 03/21/2019   Procedure: ESOPHAGOGASTRODUODENOSCOPY (EGD) WITH PROPOFOL ;  Surgeon: Wilhelmenia Aloha Raddle., MD;  Location: [facility] ENDOSCOPY;  Service: Gastroenterology;  Laterality: N/A;   EYE SURGERY     Lasik   FLEXIBLE SIGMOIDOSCOPY  07/31/2016   Procedure: FLEXIBLE SIGMOIDOSCOPY;  Surgeon: Debby Hila, MD;  Location: [facility] ENDOSCOPY;  Service: Endoscopy;;   HEMOSTASIS CLIP PLACEMENT  03/21/2019   Procedure: HEMOSTASIS CLIP PLACEMENT;  Surgeon: Wilhelmenia Aloha Raddle., MD;  Location: [facility] ENDOSCOPY;  Service: Gastroenterology;;   HYSTEROSCOPY  02/01/2010   ENDO POLYP - [facility]   IR RADIOLOGIST EVAL & MGMT  11/16/2018   IR RADIOLOGIST EVAL & MGMT  09/06/2019   SCLEROTHERAPY  03/21/2019   Procedure: MATIAS;  Surgeon: Mansouraty, Aloha Raddle., MD;  Location: [facility] ENDOSCOPY;  Service: Gastroenterology;;    Social History   Socioeconomic History   Marital  status: Divorced    Spouse name: Not on file   Number of children: 0   Years of education: 14   Highest education level: Not on file  Occupational History    Employer: [employer]  Tobacco Use   Smoking status: Former    Current packs/day: 0.00    Types: Cigarettes    Start date: 2000    Quit date: 2010    Years since quitting: 15.7   Smokeless tobacco: Never  Vaping Use   Vaping status: Never Used  Substance and Sexual Activity   Alcohol  use: No    Drug use: No   Sexual activity: Yes    Partners: Male    Birth control/protection: I.U.D.    Comment: Mirena   Other Topics Concern   Not on file  Social History Narrative   Not on file   Social Drivers of Health   Financial Resource Strain: Low Risk  (05/19/2023)   Received from [facility]   Overall Financial Resource Strain (CARDIA)    Difficulty of Paying Living Expenses: Not hard at all  Food Insecurity: No Food Insecurity (05/19/2023)   Received from [facility]   Hunger Vital Sign    Within the past 12 months, you worried that your food would run out before you got the money to buy more.: Never true    Within the past 12 months, the food you bought just didn't last and you didn't have money to get more.: Never true  Transportation Needs: No Transportation Needs (05/19/2023)   Received from [facility] - Transportation    In the past 12 months, has lack of transportation kept you from medical appointments or from getting medications?: No    Lack of Transportation (Non-Medical): No  Physical Activity: Not on file  Stress: Not on file  Social Connections: Not on file  Intimate Partner Violence: Not on file    Family History  Problem Relation Age of Onset   Rheum arthritis Mother    Hypertension Mother    Thyroid  disease Mother        HYPOTHYROIDISM   Lung cancer Mother 2   Heart disease Father    Diabetes Father    Hypertension Father    Breast cancer Maternal Aunt 55       has contact   Cancer Paternal Grandmother        MELANOMA OF SKIN   Cancer Cousin        KIDNEY-COUSIN   Colon cancer Neg Hx    Stomach cancer Neg Hx    Pancreatic cancer Neg Hx    Esophageal cancer Neg Hx    Inflammatory bowel disease Neg Hx    Rectal cancer Neg Hx    Colon polyps Neg Hx    Crohn'Mathis Cashman disease Neg Hx    Ulcerative colitis Neg Hx    Multiple sclerosis Neg Hx     Allergies  Allergen Reactions    Sulfamethoxazole-Trimethoprim Rash and Hives   Sulfa Antibiotics Other (See Comments) and Rash    Outpatient Medications Prior to Visit  Medication Sig   acetaminophen  (TYLENOL ) 325 MG tablet Take 2 tablets (650 mg total) by mouth every 6 (six) hours as needed for mild pain (or Fever >/= 101).   busPIRone  (BUSPAR ) 15 MG tablet TAKE 1 TABLET BY MOUTH TWICE DAILY   chlorthalidone  (HYGROTON ) 25 MG tablet Take 1 tablet (25 mg total) by mouth daily.   cholecalciferol  (VITAMIN D3) 25  MCG (1000 UT) tablet Take 1,000 Units by mouth daily.   cyclobenzaprine  (FLEXERIL ) 5 MG tablet Take 1 tablet (5 mg total) by mouth at bedtime as needed. (Patient taking differently: Take 5 mg by mouth at bedtime as needed for muscle spasms.)   esomeprazole  (NEXIUM ) 40 MG capsule Take 1 capsule (40 mg total) by mouth daily.   FLUoxetine  (PROZAC ) 10 MG capsule TAKE 1 CAPSULE(10 MG) BY MOUTH DAILY   gabapentin  (NEURONTIN ) 300 MG capsule TAKE 1 CAPSULE BY MOUTH IN THE  MORNING , 1 CAPSULE IN THE  EVENING AND 2 CAPSULES AT  BEDTIME   levonorgestrel  (MIRENA ) 20 MCG/DAY IUD 1 each by Intrauterine route once.   lidocaine  (LIDODERM ) 5 % Place 1 patch onto the skin daily. Remove & Discard patch within 12 hours or as directed by MD (Patient taking differently: Place 1 patch onto the skin as needed. Remove & Discard patch within 12 hours or as directed by MD)   LORazepam  (ATIVAN ) 1 MG tablet Take 1 tablet (1 mg total) by mouth daily as needed. for anxiety   mirabegron  ER (MYRBETRIQ ) 25 MG TB24 tablet Take 1 tablet (25 mg total) by mouth daily.   modafinil  (PROVIGIL ) 200 MG tablet Take 1 tablet (200 mg total) by mouth daily.   ondansetron  (ZOFRAN -ODT) 4 MG disintegrating tablet Take 4 mg by mouth every 6 (six) hours as needed for nausea or vomiting.    Polyvinyl Alcohol -Povidone (REFRESH OP) Place 2 drops into both eyes 4 (four) times daily as needed (dryness).   potassium chloride  SA (KLOR-CON  M) 20 MEQ tablet TAKE 1 TABLET(20 MEQ)  BY MOUTH DAILY   Probiotic Product (PROBIOTIC PO) Take 1 capsule by mouth daily.   Teriflunomide  14 MG TABS Take 1 tablet (14 mg total) by mouth daily.   TRELEGY ELLIPTA 200-62.5-25 MCG/ACT AEPB Inhale 1 puff into the lungs daily.   [DISCONTINUED] tirzepatide  (ZEPBOUND ) 7.5 MG/0.5ML Pen Inject 7.5 mg into the skin once a week.   fluticasone  (FLONASE) 50 MCG/ACT nasal spray Place into the nose. (Patient not taking: Reported on 12/22/2023)   mometasone  (ELOCON ) 0.1 % cream Apply 1 application topically daily as needed (Rash). Up to 5 times per week (Patient not taking: Reported on 12/22/2023)   No facility-administered medications prior to visit.    Review of Systems  Constitutional:  Positive for weight loss (9 lbs). Negative for malaise/fatigue.  HENT: Negative.    Eyes: Negative.   Respiratory: Negative.    Cardiovascular: Negative.   Gastrointestinal: Negative.   Genitourinary: Negative.   Skin: Negative.   Neurological: Negative.   Endo/Heme/Allergies: Negative.   Psychiatric/Behavioral:  The patient does not have insomnia (daytime somnolence).        Objective:   BP 120/76   Pulse 62   Temp 97.9 F (36.6 C)   Ht 5' 6 (1.676 m)   Wt 210 lb 9.6 oz (95.5 kg)   SpO2 97%   BMI 33.99 kg/m   Vitals:   12/22/23 1445  BP: 120/76  Pulse: 62  Temp: 97.9 F (36.6 C)  Height: 5' 6 (1.676 m)  Weight: 210 lb 9.6 oz (95.5 kg)  SpO2: 97%  BMI (Calculated): 34.01    Physical Exam Vitals reviewed.  Constitutional:      General: She is not in acute distress.    Appearance: She is obese.  HENT:     Head: Normocephalic.     Nose: Nose normal.     Mouth/Throat:     Mouth: Mucous membranes  are moist.  Eyes:     Extraocular Movements: Extraocular movements intact.     Pupils: Pupils are equal, round, and reactive to light.  Cardiovascular:     Rate and Rhythm: Normal rate and regular rhythm.     Heart sounds: No murmur heard. Pulmonary:     Effort: Pulmonary effort is  normal.     Breath sounds: No rhonchi or rales.  Abdominal:     General: Abdomen is flat.     Palpations: There is no hepatomegaly, splenomegaly or mass.  Musculoskeletal:        General: Normal range of motion.     Cervical back: Normal range of motion. No tenderness.  Skin:    General: Skin is warm and dry.  Neurological:     General: No focal deficit present.     Mental Status: She is alert and oriented to person, place, and time.     Cranial Nerves: No cranial nerve deficit.     Motor: No weakness.  Psychiatric:        Mood and Affect: Mood normal.        Behavior: Behavior normal.      No results found for any visits on 12/22/23.  No results found for this or any previous visit (from the past 2160 hours).    Assessment & Plan:  Sandra Bonilla was seen today for follow-up.  Morbid obesity (HCC) -     Tirzepatide ; Inject 10 mg into the skin once a week.  Dispense: 2 mL; Refill: 1  Primary hypertension  Prediabetes  Anxiety -     Ambulatory referral to Psychology    Problem List Items Addressed This Visit       Other   Anxiety   Relevant Orders   Ambulatory referral to Psychology   Morbid obesity (HCC) - Primary   Relevant Medications   tirzepatide  (MOUNJARO ) 10 MG/0.5ML Pen   Other Visit Diagnoses       Primary hypertension         Prediabetes           Return in about 2 months (around 02/21/2024) for Weight management.   Total time spent: 20 minutes  Sherrill Cinderella Perry, MD  12/22/2023   This document may have been prepared by [facility] [place] Voice Recognition software and as such may include unintentional dictation errors.

## 2023-12-23 ENCOUNTER — Ambulatory Visit: Admitting: Internal Medicine

## 2023-12-28 ENCOUNTER — Other Ambulatory Visit: Payer: Self-pay

## 2023-12-30 ENCOUNTER — Other Ambulatory Visit: Payer: Self-pay | Admitting: Internal Medicine

## 2023-12-30 DIAGNOSIS — F419 Anxiety disorder, unspecified: Secondary | ICD-10-CM

## 2023-12-30 DIAGNOSIS — I10 Essential (primary) hypertension: Secondary | ICD-10-CM

## 2023-12-31 ENCOUNTER — Other Ambulatory Visit: Payer: Self-pay

## 2024-01-01 ENCOUNTER — Other Ambulatory Visit: Payer: Self-pay | Admitting: Internal Medicine

## 2024-01-01 ENCOUNTER — Encounter: Payer: Self-pay | Admitting: Internal Medicine

## 2024-01-01 MED ORDER — ZEPBOUND 10 MG/0.5ML ~~LOC~~ SOAJ: 10.0000 mg | SUBCUTANEOUS | 1 refills | Status: DC

## 2024-01-05 ENCOUNTER — Other Ambulatory Visit: Payer: Self-pay

## 2024-01-05 DIAGNOSIS — F419 Anxiety disorder, unspecified: Secondary | ICD-10-CM

## 2024-01-05 DIAGNOSIS — F418 Other specified anxiety disorders: Secondary | ICD-10-CM

## 2024-01-05 MED ORDER — FLUOXETINE HCL 10 MG PO CAPS
10.0000 mg | ORAL_CAPSULE | Freq: Every day | ORAL | 1 refills | Status: AC
Start: 2024-01-05 — End: ?

## 2024-01-05 MED ORDER — BUSPIRONE HCL 15 MG PO TABS: 15.0000 mg | ORAL_TABLET | Freq: Two times a day (BID) | ORAL | 1 refills | Status: DC

## 2024-01-06 ENCOUNTER — Encounter: Payer: Self-pay | Admitting: Internal Medicine

## 2024-01-07 ENCOUNTER — Ambulatory Visit

## 2024-01-08 ENCOUNTER — Ambulatory Visit (INDEPENDENT_AMBULATORY_CARE_PROVIDER_SITE_OTHER): Admitting: Internal Medicine

## 2024-01-08 DIAGNOSIS — D519 Vitamin B12 deficiency anemia, unspecified: Secondary | ICD-10-CM

## 2024-01-08 MED ORDER — CYANOCOBALAMIN 1000 MCG/ML IJ SOLN
1000.0000 ug | Freq: Once | INTRAMUSCULAR | Status: AC
  Administered 2024-01-08: 1000 ug via INTRAMUSCULAR

## 2024-01-11 NOTE — Progress Notes (Signed)
 Patient ID: Sandra Bonilla, female   DOB: 09-03-1975, 48 y.o.   MRN: [id number]  B12 injection

## 2024-01-11 NOTE — Progress Notes (Signed)
 Patient ID: Sandra Bonilla, female   DOB: May 16, 1975, 48 y.o.   MRN: [id number]  B12 injection given to patient

## 2024-01-11 NOTE — Progress Notes (Signed)
 B12 injection only

## 2024-01-11 NOTE — Progress Notes (Signed)
 Patient ID: Sandra Bonilla, female   DOB: Jan 12, 1976, 48 y.o.   MRN: [id number]  B12 injection given

## 2024-01-13 ENCOUNTER — Encounter: Payer: Self-pay | Admitting: Gastroenterology

## 2024-01-14 ENCOUNTER — Other Ambulatory Visit: Payer: Self-pay | Admitting: *Deleted

## 2024-01-14 MED ORDER — MIRABEGRON ER 25 MG PO TB24: 25.0000 mg | ORAL_TABLET | Freq: Every day | ORAL | 1 refills | Status: AC

## 2024-01-14 NOTE — Telephone Encounter (Signed)
 Last seen on 09/02/23 Follow up scheduled on 04/12/24

## 2024-01-15 ENCOUNTER — Other Ambulatory Visit: Payer: Self-pay | Admitting: Neurology

## 2024-01-15 ENCOUNTER — Other Ambulatory Visit: Payer: Self-pay | Admitting: Gastroenterology

## 2024-01-17 ENCOUNTER — Other Ambulatory Visit: Payer: Self-pay | Admitting: Gastroenterology

## 2024-01-17 DIAGNOSIS — R140 Abdominal distension (gaseous): Secondary | ICD-10-CM

## 2024-01-18 NOTE — Telephone Encounter (Signed)
 Rx was sent to [facility] on 01/14/24

## 2024-01-19 ENCOUNTER — Ambulatory Visit: Payer: Self-pay | Admitting: Gastroenterology

## 2024-01-19 LAB — HEPATIC FUNCTION PANEL
ALT: 11 IU/L (ref 0–32)
AST: 19 IU/L (ref 0–40)
Albumin: 4 g/dL (ref 3.9–4.9)
Alkaline Phosphatase: 105 IU/L (ref 41–116)
Bilirubin Total: 0.6 mg/dL (ref 0.0–1.2)
Bilirubin, Direct: 0.23 mg/dL (ref 0.00–0.40)
Total Protein: 6.4 g/dL (ref 6.0–8.5)

## 2024-01-19 LAB — B12 AND FOLATE PANEL
Folate: >20 ng/mL (ref >3.0)
Vitamin B-12: 568 pg/mL (ref 232–1245)

## 2024-01-19 LAB — CBC
Hematocrit: 37.9 % (ref 34.0–46.6)
Hemoglobin: 11.9 g/dL (ref 11.1–15.9)
MCH: 28.4 pg (ref 26.6–33.0)
MCHC: 31.4 g/dL — ABNORMAL LOW (ref 31.5–35.7)
MCV: 91 fL (ref 79–97)
Platelets: 275 x10E3/uL (ref 150–450)
RBC: 4.19 x10E6/uL (ref 3.77–5.28)
RDW: 14.8 % (ref 11.7–15.4)
WBC: 6.9 x10E3/uL (ref 3.4–10.8)

## 2024-01-19 LAB — INTRINSIC FACTOR ANTIBODIES: Intrinsic Factor Abs, Serum: 1 [AU]/ml (ref 0.0–1.1)

## 2024-01-19 LAB — GAMMA GT: GGT: 23 IU/L (ref 0–60)

## 2024-01-19 LAB — ANTI-PARIETAL ANTIBODY: Parietal Cell Ab: 1 U (ref 0.0–20.0)

## 2024-01-21 ENCOUNTER — Encounter: Payer: Self-pay | Admitting: Psychology

## 2024-01-21 ENCOUNTER — Ambulatory Visit (INDEPENDENT_AMBULATORY_CARE_PROVIDER_SITE_OTHER): Admitting: Psychology

## 2024-01-21 ENCOUNTER — Telehealth: Payer: Self-pay | Admitting: Neurology

## 2024-01-21 DIAGNOSIS — F411 Generalized anxiety disorder: Secondary | ICD-10-CM | POA: Diagnosis not present

## 2024-01-21 NOTE — Progress Notes (Addendum)
  Behavioral Health Counselor Initial Adult Exam  Name: Sandra Bonilla Date: 01/21/2024 MRN: [id number] DOB: 1976-01-11 PCP: Albina GORMAN Dine, MD  Time spent: 11:00am-11:57am    Pt is seen for an in person visit.   Guardian/Payee:  self     Paperwork requested: No   Reason for Visit /Presenting Problem: Pt is referred by her PCP for anxiety.  Pt reports she is tx for anxiety and depression w/ medication by her PCP.  Pt reports she is still struggling w/ a lot of anxiety and so recommended for counseling.  Pt reports had counseling in past 2011/2012 following divorce.  Pt reports a lot of anxiety surrounds her MS and worries about her day to day functioning w/. Pt her divorce was major life change as was sudden, unexpected, lost everything and ex stated that divorced because she had MS and had changed. Pt was dx w/ MS at 48 years old.    Mental Status Exam: Appearance:   Well Groomed     Behavior:  Appropriate  Motor:  Normal  Speech/Language:   Normal Rate  Affect:  Appropriate and Tearful  Mood:  anxious  Thought process:  normal  Thought content:    WNL  Sensory/Perceptual disturbances:    WNL  Orientation:  oriented to person, place, time/date, and situation  Attention:  Good  Concentration:  Good  Memory:  WNL  Fund of knowledge:   Good  Insight:    Good  Judgment:   Good  Impulse Control:  Good   Reported Symptoms:  Pt scored a 12 on GAD7.  Pt reports feeling anxious and on edge daily.  Pt reports rumination and worry about her health and functioning w/ M.S., what if her legs give out.  Pt reports depressed moods and some loss of interest.  Pt reports some difficulty maintaining sleep w/ her body waking and anxiety playing a role.  Pt has sleep apnea- goes to bed around  9pm/10pm and wake at 5:30am.  Pt struggles w/ fatigue.  Pt does struggle w/ thoughts of being damaged and trust issues since ex's actions. Pt scored 9 on PHQ9.  Pt does struggle w/ forgetting  things at times related to MS.  Risk Assessment: Danger to Self:  No Self-injurious Behavior: No Danger to Others: No Duty to Warn:no Physical Aggression / Violence:No  Access to Firearms a concern: No  Gang Involvement:No  Patient / guardian was educated about steps to take if suicide or homicide risk level increases between visits: no While future psychiatric events cannot be accurately predicted, the patient does not currently require acute inpatient psychiatric care and does not currently meet [place]  involuntary commitment criteria.  Substance Abuse History: Current substance abuse: No     Past Psychiatric History:   Previous psychological history is significant for anxiety and depression Outpatient Providers:counseling in 2011/2012 History of Psych Hospitalization: No  Psychological Testing: none   Abuse History:  Victim of: No., none   Report needed: No. Victim of Neglect:No. Perpetrator of none  Witness / Exposure to Domestic Violence: No   Protective Services Involvement: No  Witness to [employer] Violence:  No   Family History:  Family History  Problem Relation Age of Onset   Rheum arthritis Mother    Hypertension Mother    Thyroid  disease Mother        HYPOTHYROIDISM   Lung cancer Mother 61   Heart disease Father    Diabetes Father    Hypertension Father  Breast cancer Maternal Aunt 50       has contact   Cancer Paternal Grandmother        MELANOMA OF SKIN   Cancer Cousin        KIDNEY-COUSIN   Colon cancer Neg Hx    Stomach cancer Neg Hx    Pancreatic cancer Neg Hx    Esophageal cancer Neg Hx    Inflammatory bowel disease Neg Hx    Rectal cancer Neg Hx    Colon polyps Neg Hx    Crohn's disease Neg Hx    Ulcerative colitis Neg Hx    Multiple sclerosis Neg Hx   Pt grew up in [place], [place] w/ her mom, dad and older brother.  Pt reports she had good parents, good childhood and always surrounded by family.  Pt moved back in to her parents home in  Feb 22, 2009 when separated from husband.  Pt's mom was dx w/ Lung cancer in February 22, 2014 and died February 23, 2015.  Her father died from COVID 02-23-20.  Pt struggles w/ guilt as she feels she gave him COVID.  Pt had colon surgery 02-23-20 and was out of work for 6 months following complications after surgery.  Pt reports close w/ her brother.  Pt reports they host family dinner every Saturday that cousins, nephews and family friends attend.  Pt reports her 20 and 23y/o cousin look at her and brother as parental figures.    Living situation: the patient lives with her brother and dog, sebastian, toy [place] shepard- 6y/o. Brother's dog chiweenie 20y/o.  Sexual Orientation: Straight  Relationship Status: divorced in Feb 22, 2010 after 13years of marriage.  Pt reports was devastating and came out of nowhere.  After returned from vacation, ex stated wanted a divorce as should had left when got dx w/ MS.  Pt reports he wouldn't let her get her things from the home, gave her things away, would let her have any of the dogs.  Pt reports lost house as he stopped paying their mortgage.  If a parent, number of children / ages: no kids.  Pt reports she is unable to have kids.    Support Systems: her brother, her 24y/o cousin, her friends.  Financial Stress:  No   Income/Employment/Disability: Employment.  Fulltime at [employer] for 25 years.  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  Pt is the only supervisor in the building, so other employees come to her to solve things.  Pt reports she has a good relationship w/ her Manager .  Military Service: No   Educational History: Education: not reported  Religion/Sprituality/World View: Not reported  Any cultural differences that may affect / interfere with treatment:  not applicable   Recreation/Hobbies: family dinners, like to travel- weekend trip w/ friend, dinner w/ friends.  Stressors: Health problems    Strengths: Supportive Relationships and  Family  Barriers:  none    Legal History: Pending legal issue / charges: The patient has no significant history of legal issues. History of legal issue / charges: none  Medical History/Surgical History: reviewed Past Medical History:  Diagnosis Date   Anemia 05/2019   iron  infusions   Anxiety    Asthma    Cervical high risk human papillomavirus (HPV) DNA test positive 09/2015   Cholecystitis 02-22-05   GALBLADDER REMOVED   Depression    Diverticulitis    Diverticulosis    Dysplastic nevus 08/24/2008   right upper back lat, right upper back medial  Dysplastic nevus 04/21/2022   upper back medial to middlescapula, mild atypia   Fibroadenoma 2016   RIGHT BREAST   GERD (gastroesophageal reflux disease)    Head injury 06/25/2016   History of mammogram 11/2014   FIBROADENOMA   History of Papanicolaou smear of cervix 01/24/13; 10/15/15   -/-; -/+   Hypertension    Menorrhagia 02/01/2010   D/C HYSTEROSCOPY ENDO POLYP   MS (multiple sclerosis)    Neuromuscular disorder (HCC)    Ovarian cyst 9/15; 2015-2016   LEFT - WENT TO [facility]; RIGHT   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   Sleep apnea    WEAR C PAP  Pt f/u w/ Gastroenterologist and Neurologist  Past Surgical History:  Procedure Laterality Date   BIOPSY  03/21/2019   Procedure: BIOPSY;  Surgeon: Wilhelmenia Aloha Raddle., MD;  Location: [facility] ENDOSCOPY;  Service: Gastroenterology;;   BREAST BIOPSY Right 2016   Fibroadenoma   CHOLECYSTECTOMY  2006   COLON RESECTION N/A 08/11/2019   Procedure: DIAGNOSTIC LAPAROSCOPY WITH WASH OUT, DRAIN PLACEMENT, AND LOOP ILEOSTOMY CREATION;  Surgeon: Debby Hila, MD;  Location: [facility] ORS;  Service: General;  Laterality: N/A;   COLONOSCOPY WITH PROPOFOL  N/A 03/21/2019   Procedure: COLONOSCOPY WITH PROPOFOL ;  Surgeon: Wilhelmenia Aloha Raddle., MD;  Location: [facility] ENDOSCOPY;  Service: Gastroenterology;  Laterality: N/A;  ultraslim colonoscope    CYSTOSCOPY WITH INJECTION N/A 08/04/2019    Procedure: FIREFLY INJECTION;  Surgeon: Cam Morene ORN, MD;  Location: [facility] ORS;  Service: Urology;  Laterality: N/A;   DILATION AND CURETTAGE, DIAGNOSTIC / THERAPEUTIC  02/01/2010   D/C HYSTEROSCOPY ENDO POLYP; [facility]   ENDOSCOPIC MUCOSAL RESECTION  03/21/2019   Procedure: ENDOSCOPIC MUCOSAL RESECTION;  Surgeon: Wilhelmenia Aloha Raddle., MD;  Location[facility] END[facility]COPY;  Service: Gastroenterology;;   ESOPHAGOGASTRODUODENOSCOPY (EGD) WITH PROPOFOL  N/A 03/21/2019   Procedure: ESOPHAGOGASTRODUODENOSCOPY (EGD) WITH PROPOFOL ;  Surgeon: Wilhelmenia Aloha Raddle., MD;  Location: [facility] ENDOSCOPY;  Service: Gastroenterology;  Laterality: N/A;   EYE SURGERY     Lasik   FLEXIBLE SIGMOIDOSCOPY  07/31/2016   Procedure: FLEXIBLE SIGMOIDOSCOPY;  Surgeon: Debby Hila, MD;  Location: [facility] ENDOSCOPY;  Service: Endoscopy;;   HEMOSTASIS CLIP PLACEMENT  03/21/2019   Procedure: HEMOSTASIS CLIP PLACEMENT;  Surgeon: Wilhelmenia Aloha Raddle., MD;  Location: [facility] ENDOSCOPY;  Service: Gastroenterology;;   HYSTEROSCOPY  02/01/2010   ENDO POLYP - [facility]   IR RADIOLOGIST EVAL & MGMT  11/16/2018   IR RADIOLOGIST EVAL & MGMT  09/06/2019   SCLEROTHERAPY  03/21/2019   Procedure: SCLEROTHERAPY;  Surgeon: Mansouraty, Aloha Raddle., MD;  Location: [facility] ENDOSCOPY;  Service: Gastroenterology;;    Medications: Current Outpatient Medications  Medication Sig Dispense Refill   acetaminophen  (TYLENOL ) 325 MG tablet Take 2 tablets (650 mg total) by mouth every 6 (six) hours as needed for mild pain (or Fever >/= 101).     busPIRone  (BUSPAR ) 15 MG tablet Take 1 tablet (15 mg total) by mouth 2 (two) times daily. 180 tablet 1   chlorthalidone  (HYGROTON ) 25 MG tablet TAKE 1 TABLET BY MOUTH DAILY 90 tablet 1   cholecalciferol  (VITAMIN D3) 25 MCG (1000 UT) tablet Take 1,000 Units by mouth daily.     cyclobenzaprine  (FLEXERIL ) 5 MG tablet Take 1 tablet (5 mg total) by mouth at bedtime as needed. (Patient taking differently: Take 5 mg by mouth at bedtime as  needed for muscle spasms.) 30 tablet 11   esomeprazole  (NEXIUM ) 40 MG capsule TAKE 1 CAPSULE(40 MG) BY MOUTH DAILY 90 capsule 4   FLUoxetine  (  PROZAC ) 10 MG capsule Take 1 capsule (10 mg total) by mouth daily. 90 capsule 1   fluticasone  (FLONASE) 50 MCG/ACT nasal spray Place into the nose. (Patient not taking: Reported on 12/22/2023)     gabapentin  (NEURONTIN ) 300 MG capsule TAKE 1 CAPSULE BY MOUTH IN THE  MORNING , 1 CAPSULE IN THE  EVENING AND 2 CAPSULES AT  BEDTIME 360 capsule 1   levonorgestrel  (MIRENA ) 20 MCG/DAY IUD 1 each by Intrauterine route once.     lidocaine  (LIDODERM ) 5 % Place 1 patch onto the skin daily. Remove & Discard patch within 12 hours or as directed by MD (Patient taking differently: Place 1 patch onto the skin as needed. Remove & Discard patch within 12 hours or as directed by MD) 90 patch 0   LORazepam  (ATIVAN ) 1 MG tablet TAKE 1 TABLET BY MOUTH DAILY AS  NEEDED FOR ANXIETY 90 tablet 0   mirabegron  ER (MYRBETRIQ ) 25 MG TB24 tablet Take 1 tablet (25 mg total) by mouth daily. 90 tablet 1   modafinil  (PROVIGIL ) 200 MG tablet Take 1 tablet (200 mg total) by mouth daily. 30 tablet 5   mometasone  (ELOCON ) 0.1 % cream Apply 1 application topically daily as needed (Rash). Up to 5 times per week (Patient not taking: Reported on 12/22/2023) 45 g 1   ondansetron  (ZOFRAN -ODT) 4 MG disintegrating tablet Take 4 mg by mouth every 6 (six) hours as needed for nausea or vomiting.      Polyvinyl Alcohol -Povidone (REFRESH OP) Place 2 drops into both eyes 4 (four) times daily as needed (dryness).     potassium chloride  SA (KLOR-CON  M) 20 MEQ tablet TAKE 1 TABLET(20 MEQ) BY MOUTH DAILY 90 tablet 1   Probiotic Product (PROBIOTIC PO) Take 1 capsule by mouth daily.     Teriflunomide  14 MG TABS Take 1 tablet (14 mg total) by mouth daily. 30 tablet 5   tirzepatide  (ZEPBOUND ) 10 MG/0.5ML Pen Inject 10 mg into the skin once a week. 2 mL 1   TRELEGY ELLIPTA 200-62.5-25 MCG/ACT AEPB Inhale 1 puff into the  lungs daily.     No current facility-administered medications for this visit.    Allergies  Allergen Reactions   Sulfamethoxazole-Trimethoprim Rash and Hives   Sulfa Antibiotics Other (See Comments) and Rash    Diagnoses:  Generalized anxiety disorder  Plan of Care: Pt is a 48y/o female seeking counseling for anxiety.  Pt reports her anxiety and worry is mostly related to her MS dx and concerns for functioning. Pt has ruminating worries and always feeling on edge.  Pt reports some depressed moods as well.  Pt reports that her divorce was a major life change in stressor.  Pt has also dealt w/ the loss of her mother, father and other family members.  Pt seeking support for improved coping w/ anxiety.  Pt to f/u w/ her PCP, Gastroenterologist and Neurologist as scheduled.    Individualized Treatment Plan Strengths: enjoys family dinners, her dog, travel w/ friend and dinners w/ friends.   Supports: her brother, her cousin   Goal/Needs for Treatment:  In order of importance to patient 1) manage anxiety 2) --- 3) ---   Client Statement of Needs: Pt: reducing the anxiety and the stress.  Figure out ways to reduce   Treatment Level:outpatient counseling  Symptoms:anxiety, ruminating worry, feeling on edge, depressed moods.   Client Treatment Preferences:biweekly in person counseling.  Pt to f/u w/ PCP for medication management.    Healthcare consumer's goal for treatment:  Counselor, Damien Herald, [facility] will support the patient's ability to achieve the goals identified. Cognitive Behavioral Therapy, Assertive Communication/Conflict Resolution Training, Relaxation Training, ACT, Humanistic and other evidenced-based practices will be used to promote progress towards healthy functioning.   Healthcare consumer will: Actively participate in therapy, working towards healthy functioning.    *Justification for Continuation/Discontinuation of Goal: R=Revised, O=Ongoing, A=Achieved,  D=Discontinued  Goal 1) Increase awareness anxious thought patterns and ways to challenge, reframe and redirect AEB pt report and therapist observation.  Baseline date 01/21/24: Progress towards goal 0; How Often - Daily Target Date Goal Was reviewed Status Code Progress towards goal/Likert rating  01/20/25                Goal 2) Increased relaxation, mindfulness and desscalation skills to assist in managing anxiety AEB pt report and therapist observation.  Baseline date 01/21/24: Progress towards goal 0; How Often - Daily Target Date Goal Was reviewed Status Code Progress towards goal  01/20/25                 This plan has been reviewed and created by the following participants:  This plan will be reviewed at least every 12 months. Date Behavioral Health Clinician Date Guardian/Patient   01/21/24  [facility] Herald [facility] 01/21/24 Verbal Consent Provided and requested electronic signature                    HERALD DAMIEN [facility]

## 2024-01-21 NOTE — Progress Notes (Signed)
   Forde Radon, [facility]

## 2024-01-21 NOTE — Telephone Encounter (Signed)
 Lauren from [facility] called to request PT ICD 10 CODE  . For medication Teriflunomide  14 MG TABS   Callback # [phone]

## 2024-01-21 NOTE — Progress Notes (Signed)
 Chief Complaint  Patient presents with   Gynecologic Exam    HPI:      [name] is a 48 y.o. G0P0000 who LMP was No LMP recorded. (Menstrual status: IUD)., presents today for her annual examination.  Her menses are absent now with IUD, no BTB/no dysmen. No VS sx.   Sex activity: currently sexually active; Contraception-- Has Mirena , replaced 10/25/21 after IUD strings lost and GYN u/s didn't show IUD in place 6/23. No pain/bleeding/dryness.   Last Pap: 01/22/23 Results were NILM, neg HPV DNA. 05/15/21 Results were normal, neg HPV DNA. 05/14/20 Results were neg cells/neg HPV DNA  01/26/19 neg cells, POS HPV DNA.  12/01/17 ASCUS/neg HPV DNA.  07/01/17  no abnormalities /neg HPV DNA.  10/15/16 pap was LGSIL/POS HPV DNA. Neg colpo and bx 9/18.  She is due for repeat pap today. She is also on immune modulator med for MS and should have yearly paps.  Hx of STDs: HPV  Last mammogram: 02/11/23 Results were Cat 3 with stable LT mass; repeat dx mammo in 12 months. 07/17/21 bilat mammo; Results--Cat 3 LT breast mass; repeat in 6 months; 01/13/22 cat 3 LT breast, repeat dx mammo and u/s in 6 months (due 10/23);  05/29/20 Results were: normal--routine follow-up in 12 months There is a  FH of breast cancer in her mat aunt, genetic testing not indicated. There is no FH of ovarian cancer. The patient does self-breast exams.  Tobacco use: The patient denies current or previous tobacco use. Alcohol  use: none  No drug use. 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 Hx short term ileostomy/3 major surgeries; repeat colonoscopies due Q1-2 yrs per pt. Sees GI regularly, last seen 8/25.  She does get adequate calcium  and Vitamin D  in her diet.  Labs with PCP. Has lost 60# with zepbound .   Has MS, doing well.   Past Medical History:  Diagnosis Date   Anemia 05/2019   iron  infusions   Anxiety     Asthma    Cervical high risk human papillomavirus (HPV) DNA test positive 09/2015   Cholecystitis 2006   GALBLADDER REMOVED   Depression    Diverticulitis    Diverticulosis    Dysplastic nevus 08/24/2008   right upper back lat, right upper back medial   Dysplastic nevus 04/21/2022   upper back medial to middlescapula, mild atypia   Fibroadenoma 2016   RIGHT BREAST   GERD (gastroesophageal reflux disease)    Head injury 06/25/2016   History of mammogram 11/2014   FIBROADENOMA   History of Papanicolaou smear of cervix 01/24/13; 10/15/15   -/-; -/+   Hypertension    Menorrhagia 02/01/2010   D/C HYSTEROSCOPY ENDO POLYP   MS (multiple sclerosis)    Neuromuscular disorder (HCC)    Ovarian cyst 9/15; 2015-2016   LEFT - WENT TO [place]; RIGHT   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   Sleep apnea    WEAR C PAP    Past Surgical History:  Procedure Laterality Date   BIOPSY  03/21/2019   Procedure: BIOPSY;  Surgeon: Wilhelmenia Aloha Raddle., MD;  Location: [facility] ENDOSCOPY;  Service: Gastroenterology;;   BREAST BIOPSY Right 2016   Fibroadenoma   CHOLECYSTECTOMY  2006   COLON RESECTION N/A 08/11/2019   Procedure: DIAGNOSTIC LAPAROSCOPY WITH WASH OUT, DRAIN PLACEMENT, AND LOOP ILEOSTOMY CREATION;  Surgeon: Debby Hila, MD;  Location: [facility] ORS;  Service:  General;  Laterality: N/A;   COLONOSCOPY WITH PROPOFOL  N/A 03/21/2019   Procedure: COLONOSCOPY WITH PROPOFOL ;  Surgeon: Wilhelmenia Aloha Raddle., MD;  Location: [facility] ENDOSCOPY;  Service: Gastroenterology;  Laterality: N/A;  ultraslim colonoscope    CYSTOSCOPY WITH INJECTION N/A 08/04/2019   Procedure: FIREFLY INJECTION;  Surgeon: Cam Morene ORN, MD;  Location: [facility] ORS;  Service: Urology;  Laterality: N/A;   DILATION AND CURETTAGE, DIAGNOSTIC / THERAPEUTIC  02/01/2010   D/C HYSTEROSCOPY ENDO POLYP; [facility]   ENDOSCOPIC MUCOSAL RESECTION  03/21/2019   Procedure: ENDOSCOPIC MUCOSAL RESECTION;  Surgeon: Wilhelmenia Aloha Raddle., MD;   Location: [facility] ENDOSCOPY;  Service: Gastroenterology;;   ESOPHAGOGASTRODUODENOSCOPY (EGD) WITH PROPOFOL  N/A 03/21/2019   Procedure: ESOPHAGOGASTRODUODENOSCOPY (EGD) WITH PROPOFOL ;  Surgeon: Wilhelmenia Aloha Raddle., MD;  Location: [facility] ENDOSCOPY;  Service: Gastroenterology;  Laterality: N/A;   EYE SURGERY     Lasik   FLEXIBLE SIGMOIDOSCOPY  07/31/2016   Procedure: FLEXIBLE SIGMOIDOSCOPY;  Surgeon: Debby Hila, MD;  Location: [facility] ENDOSCOPY;  Service: Endoscopy;;   HEMOSTASIS CLIP PLACEMENT  03/21/2019   Procedure: HEMOSTASIS CLIP PLACEMENT;  Surgeon: Wilhelmenia Aloha Raddle., MD;  Location: [facility] ENDOSCOPY;  Service: Gastroenterology;;   HYSTEROSCOPY  02/01/2010   ENDO POLYP - [facility]   IR RADIOLOGIST EVAL & MGMT  11/16/2018   IR RADIOLOGIST EVAL & MGMT  09/06/2019   SCLEROTHERAPY  03/21/2019   Procedure: MATIAS;  Surgeon: Mansouraty, Aloha Raddle., MD;  Location: [facility] ENDOSCOPY;  Service: Gastroenterology;;    Family History  Problem Relation Age of Onset   Rheum arthritis Mother    Hypertension Mother    Thyroid  disease Mother        HYPOTHYROIDISM   Lung cancer Mother 37   Heart disease Father    Diabetes Father    Hypertension Father    Breast cancer Maternal Aunt 31       has contact   Cancer Paternal Grandmother        MELANOMA OF SKIN   Cancer Cousin        KIDNEY-COUSIN   Colon cancer Neg Hx    Stomach cancer Neg Hx    Pancreatic cancer Neg Hx    Esophageal cancer Neg Hx    Inflammatory bowel disease Neg Hx    Rectal cancer Neg Hx    Colon polyps Neg Hx    Crohn's disease Neg Hx    Ulcerative colitis Neg Hx    Multiple sclerosis Neg Hx     Social History   Socioeconomic History   Marital status: Divorced    Spouse name: Not on file   Number of children: 0   Years of education: 14   Highest education level: Not on file  Occupational History    Employer: [employer]  Tobacco Use   Smoking status: Former    Current packs/day: 0.00    Types: Cigarettes    Start date:  2000    Quit date: 2010    Years since quitting: 15.8   Smokeless tobacco: Never  Vaping Use   Vaping status: Never Used  Substance and Sexual Activity   Alcohol  use: No   Drug use: No   Sexual activity: Yes    Partners: Male    Birth control/protection: I.U.D.    Comment: Mirena   Other Topics Concern   Not on file  Social History Narrative   Not on file   Social Drivers of Health   Financial Resource Strain: Low Risk  (05/19/2023)   Received from [facility]  Overall Financial Resource Strain (CARDIA)    Difficulty of Paying Living Expenses: Not hard at all  Food Insecurity: No Food Insecurity (05/19/2023)   Received from [facility]   Hunger Vital Sign    Within the past 12 months, you worried that your food would run out before you got the money to buy more.: Never true    Within the past 12 months, the food you bought just didn't last and you didn't have money to get more.: Never true  Transportation Needs: No Transportation Needs (05/19/2023)   Received from [facility] - Transportation    In the past 12 months, has lack of transportation kept you from medical appointments or from getting medications?: No    Lack of Transportation (Non-Medical): No  Physical Activity: Not on file  Stress: Not on file  Social Connections: Not on file  Intimate Partner Violence: Not on file     Current Outpatient Medications:    acetaminophen  (TYLENOL ) 325 MG tablet, Take 2 tablets (650 mg total) by mouth every 6 (six) hours as needed for mild pain (or Fever >/= 101)., Disp: , Rfl:    busPIRone  (BUSPAR ) 15 MG tablet, Take 1 tablet (15 mg total) by mouth 2 (two) times daily., Disp: 180 tablet, Rfl: 1   chlorthalidone  (HYGROTON ) 25 MG tablet, TAKE 1 TABLET BY MOUTH DAILY, Disp: 90 tablet, Rfl: 1   cholecalciferol  (VITAMIN D3) 25 MCG (1000 UT) tablet, Take 1,000 Units by mouth daily., Disp: , Rfl:    Cyanocobalamin  (B-12  COMPLIANCE INJECTION IJ), Inject as directed., Disp: , Rfl:    cyclobenzaprine  (FLEXERIL ) 5 MG tablet, Take 1 tablet (5 mg total) by mouth at bedtime as needed. (Patient taking differently: Take 5 mg by mouth at bedtime as needed for muscle spasms.), Disp: 30 tablet, Rfl: 11   esomeprazole  (NEXIUM ) 40 MG capsule, TAKE 1 CAPSULE(40 MG) BY MOUTH DAILY, Disp: 90 capsule, Rfl: 4   FLUoxetine  (PROZAC ) 10 MG capsule, Take 1 capsule (10 mg total) by mouth daily., Disp: 90 capsule, Rfl: 1   fluticasone  (FLONASE) 50 MCG/ACT nasal spray, Place into the nose., Disp: , Rfl:    gabapentin  (NEURONTIN ) 300 MG capsule, TAKE 1 CAPSULE BY MOUTH IN THE  MORNING , 1 CAPSULE IN THE  EVENING AND 2 CAPSULES AT  BEDTIME, Disp: 360 capsule, Rfl: 1   levonorgestrel  (MIRENA ) 20 MCG/DAY IUD, 1 each by Intrauterine route once., Disp: , Rfl:    lidocaine  (LIDODERM ) 5 %, Place 1 patch onto the skin daily. Remove & Discard patch within 12 hours or as directed by MD, Disp: 90 patch, Rfl: 0   LORazepam  (ATIVAN ) 1 MG tablet, TAKE 1 TABLET BY MOUTH DAILY AS  NEEDED FOR ANXIETY, Disp: 90 tablet, Rfl: 0   mirabegron  ER (MYRBETRIQ ) 25 MG TB24 tablet, Take 1 tablet (25 mg total) by mouth daily., Disp: 90 tablet, Rfl: 1   modafinil  (PROVIGIL ) 200 MG tablet, Take 1 tablet (200 mg total) by mouth daily., Disp: 30 tablet, Rfl: 5   mometasone  (ELOCON ) 0.1 % cream, Apply 1 application topically daily as needed (Rash). Up to 5 times per week, Disp: 45 g, Rfl: 1   ondansetron  (ZOFRAN -ODT) 4 MG disintegrating tablet, Take 4 mg by mouth every 6 (six) hours as needed for nausea or vomiting. , Disp: , Rfl:    Polyvinyl Alcohol -Povidone (REFRESH OP), Place 2 drops into both eyes 4 (four) times daily as needed (dryness)., Disp: , Rfl:  potassium chloride  SA (KLOR-CON  M) 20 MEQ tablet, TAKE 1 TABLET(20 MEQ) BY MOUTH DAILY, Disp: 90 tablet, Rfl: 1   Probiotic Product (PROBIOTIC PO), Take 1 capsule by mouth daily., Disp: , Rfl:    Teriflunomide  14 MG  TABS, Take 1 tablet (14 mg total) by mouth daily., Disp: 30 tablet, Rfl: 5   tirzepatide  (ZEPBOUND ) 10 MG/0.5ML Pen, Inject 10 mg into the skin once a week., Disp: 2 mL, Rfl: 1   TRELEGY ELLIPTA 200-62.5-25 MCG/ACT AEPB, Inhale 1 puff into the lungs daily., Disp: , Rfl:   ROS:  Review of Systems  Constitutional:  Negative for fatigue, fever and unexpected weight change.  Respiratory:  Negative for cough, shortness of breath and wheezing.   Cardiovascular:  Negative for chest pain, palpitations and leg swelling.  Gastrointestinal:  Negative for blood in stool, constipation, diarrhea, nausea and vomiting.  Endocrine: Negative for cold intolerance, heat intolerance and polyuria.  Genitourinary:  Negative for dyspareunia, dysuria, flank pain, frequency, genital sores, hematuria, menstrual problem, pelvic pain, urgency, vaginal bleeding, vaginal discharge and vaginal pain.  Musculoskeletal:  Negative for arthralgias, back pain, joint swelling and myalgias.  Skin:  Negative for rash.  Neurological:  Positive for numbness. Negative for dizziness, syncope, light-headedness and headaches.  Hematological:  Negative for adenopathy.  Psychiatric/Behavioral:  Positive for agitation. Negative for confusion, dysphoric mood, sleep disturbance and suicidal ideas. The patient is not nervous/anxious.      Objective: BP 123/85   Pulse 78   Ht 5' 6 (1.676 m)   Wt 206 lb 1.6 oz (93.5 kg)   BMI 33.27 kg/m    Physical Exam Constitutional:      Appearance: She is well-developed.  Genitourinary:     Vulva normal.     Genitourinary Comments: IUD STRINGS SEEN IN CX OS     Right Labia: No rash, tenderness or lesions.    Left Labia: No tenderness, lesions or rash.    No vaginal discharge, erythema or tenderness.      Right Adnexa: not tender and no mass present.    Left Adnexa: not tender and no mass present.    No cervical motion tenderness, friability or polyp.     IUD strings visualized.     Uterus  is not enlarged or tender.  Breasts:    Right: No mass, nipple discharge, skin change or tenderness.     Left: No mass, nipple discharge, skin change or tenderness.  Neck:     Thyroid : No thyromegaly.  Cardiovascular:     Rate and Rhythm: Normal rate and regular rhythm.     Heart sounds: Normal heart sounds. No murmur heard. Pulmonary:     Effort: Pulmonary effort is normal.     Breath sounds: Normal breath sounds.  Abdominal:     Palpations: Abdomen is soft.     Tenderness: There is no abdominal tenderness. There is no guarding or rebound.  Musculoskeletal:        General: Normal range of motion.     Cervical back: Normal range of motion.  Lymphadenopathy:     Cervical: No cervical adenopathy.  Neurological:     General: No focal deficit present.     Mental Status: She is alert and oriented to person, place, and time.     Cranial Nerves: No cranial nerve deficit.  Skin:    General: Skin is warm and dry.  Psychiatric:        Mood and Affect: Mood normal.  Behavior: Behavior normal.        Thought Content: Thought content normal.        Judgment: Judgment normal.  Vitals reviewed.      Assessment/Plan: Encounter for annual routine gynecological examination  Cervical cancer screening - Plan: IGP, Aptima HPV  Screening for HPV (human papillomavirus) - Plan: IGP, Aptima HPV  Encounter for routine checking of intrauterine contraceptive device (IUD)--IUD strings in cx os, has 8 yr indication.   Encounter for screening mammogram for malignant neoplasm of breast - Plan: US  LIMITED ULTRASOUND INCLUDING AXILLA RIGHT BREAST, US  LIMITED ULTRASOUND INCLUDING AXILLA LEFT BREAST , MM 3D DIAGNOSTIC MAMMOGRAM BILATERAL BREAST; pt to schedule mammo and u/s.   Abnormal mammogram - Plan: US  LIMITED ULTRASOUND INCLUDING AXILLA RIGHT BREAST, US  LIMITED ULTRASOUND INCLUDING AXILLA LEFT BREAST , MM 3D DIAGNOSTIC MAMMOGRAM BILATERAL BREAST   GYN counsel mammography screening, adequate  intake of calcium  and vitamin D , diet and exercise     F/U  Return in about 1 year (around 01/24/2025).  Terriyah Westra B. Daray Polgar, PA-C 01/25/2024 4:25 PM

## 2024-01-21 NOTE — Telephone Encounter (Signed)
 Call to [facility] at [place], gave ICD for RRMS

## 2024-01-25 ENCOUNTER — Ambulatory Visit: Admitting: Obstetrics and Gynecology

## 2024-01-25 ENCOUNTER — Encounter: Payer: Self-pay | Admitting: Obstetrics and Gynecology

## 2024-01-25 VITALS — BP 123/85 | HR 78 | Ht 66.0 in | Wt 206.1 lb

## 2024-01-25 DIAGNOSIS — Z1151 Encounter for screening for human papillomavirus (HPV): Secondary | ICD-10-CM

## 2024-01-25 DIAGNOSIS — R928 Other abnormal and inconclusive findings on diagnostic imaging of breast: Secondary | ICD-10-CM | POA: Diagnosis not present

## 2024-01-25 DIAGNOSIS — Z01419 Encounter for gynecological examination (general) (routine) without abnormal findings: Secondary | ICD-10-CM | POA: Diagnosis not present

## 2024-01-25 DIAGNOSIS — Z975 Presence of (intrauterine) contraceptive device: Secondary | ICD-10-CM

## 2024-01-25 DIAGNOSIS — Z30431 Encounter for routine checking of intrauterine contraceptive device: Secondary | ICD-10-CM

## 2024-01-25 DIAGNOSIS — Z1231 Encounter for screening mammogram for malignant neoplasm of breast: Secondary | ICD-10-CM

## 2024-01-25 DIAGNOSIS — Z124 Encounter for screening for malignant neoplasm of cervix: Secondary | ICD-10-CM

## 2024-01-25 NOTE — Patient Instructions (Addendum)
 I value your feedback and you entrusting [place] with your care. If you get a [facility] patient survey, I would appreciate you taking the time to let us know about your experience today. Thank you!  [facility] ([place])--[phone]

## 2024-01-27 LAB — IGP, APTIMA HPV: HPV Aptima: NEGATIVE

## 2024-02-03 ENCOUNTER — Telehealth: Payer: Self-pay | Admitting: *Deleted

## 2024-02-03 NOTE — Telephone Encounter (Signed)
 Called pt and relayed message we received from [facility]. She spoke with them 02/01/24 and provided updated insurance info. Thought this was resolved but she will f/u to make sure. If anything further is needed from our office, she will let us  know.

## 2024-02-08 ENCOUNTER — Ambulatory Visit (INDEPENDENT_AMBULATORY_CARE_PROVIDER_SITE_OTHER): Admitting: Psychology

## 2024-02-08 DIAGNOSIS — F411 Generalized anxiety disorder: Secondary | ICD-10-CM

## 2024-02-08 NOTE — Progress Notes (Signed)
  Individualized Treatment Plan Strengths: enjoys family dinners, her dog, travel w/ friend and dinners w/ friends.   Supports: her brother, her cousin   Goal/Needs for Treatment:  In order of importance to patient 1) manage anxiety 2) --- 3) ---   Client Statement of Needs: Pt: reducing the anxiety and the stress.  Figure out ways to reduce   Treatment Level:outpatient counseling  Symptoms:anxiety, ruminating worry, feeling on edge, depressed moods.   Client Treatment Preferences:biweekly in person counseling.  Pt to f/u w/ PCP for medication management.    Healthcare consumer's goal for treatment:  Counselor, Damien Herald, [facility] will support the patient's ability to achieve the goals identified. Cognitive Behavioral Therapy, Assertive Communication/Conflict Resolution Training, Relaxation Training, ACT, Humanistic and other evidenced-based practices will be used to promote progress towards healthy functioning.   Healthcare consumer will: Actively participate in therapy, working towards healthy functioning.    *Justification for Continuation/Discontinuation of Goal: R=Revised, O=Ongoing, A=Achieved, D=Discontinued  Goal 1) Increase awareness anxious thought patterns and ways to challenge, reframe and redirect AEB pt report and therapist observation.  Baseline date 01/21/24: Progress towards goal 0; How Often - Daily Target Date Goal Was reviewed Status Code Progress towards goal/Likert rating  01/20/25                Goal 2) Increased relaxation, mindfulness and desscalation skills to assist in managing anxiety AEB pt report and therapist observation.  Baseline date 01/21/24: Progress towards goal 0; How Often - Daily Target Date Goal Was reviewed Status Code Progress towards goal  01/20/25                 This plan has been reviewed and created by the following participants:  This plan will be reviewed at least every 12 months. Date Behavioral Health Clinician Date  Guardian/Patient   01/21/24  [facility] Herald [facility] 01/21/24 Verbal Consent Provided and requested electronic signature                     HERALD DAMIEN [facility]

## 2024-02-08 NOTE — Progress Notes (Signed)
 [facility] Counselor/Therapist Progress Note  Patient ID: Sandra Bonilla, MRN: [id number],    Date: 02/08/2024  Time Spent: 3:31pm-4:28pm  Pt is seen for an in person visit.    Treatment Type: Individual Therapy  Reported Symptoms: anxiety, stressed/overwhelmed  Mental Status Exam: Appearance:  Well Groomed     Behavior: Appropriate  Motor: Normal  Speech/Language:  Clear and Coherent and Normal Rate  Affect: Appropriate  Mood: anxious  Thought process: normal  Thought content:   WNL  Sensory/Perceptual disturbances:   WNL  Orientation: oriented to person, place, time/date, and situation  Attention: Good  Concentration: Good  Memory: WNL  Fund of knowledge:  Good  Insight:   Good  Judgment:  Good  Impulse Control: Good   Risk Assessment: Danger to Self:  No Self-injurious Behavior: No Danger to Others: No Duty to Warn:no Physical Aggression / Violence:No  Access to Firearms a concern: No  Gang Involvement:No   Subjective: counselor assessed pt current functioning per pt report.  Processed w/pt stressors, positives and anxiety.  Explored w/ pt stressors at work and expectations feels are on her.  Assisted pt in recognizing ways to advocate for herself and assert boundaries w/ coworkers and merchandiser, retail.  Discussed importance of her selfcare and taking time for- assisting in identifying ways to implement.  Pt affect wnl.  Pt reports she is stressed and overwhelmed.  Pt reports feels anxious and at times psychosomatic symptoms.  Pt reports that she is feeling a lot of pressure on her at to work to handle things outside of her role.  Pt discussed ways she is asserting and ways to continue to set boundaries.  Pt recognized feels unheard when things don't change.  Pt identified that she doesn't take time for self- not taking her lunch, or stepping away or time off that can.  Pt identified some ways to increase awareness for self care needs.    Interventions:  Cognitive Behavioral Therapy, Solution-Oriented/Positive Psychology, and supportive and self care  Diagnosis:Generalized anxiety disorder  Plan: Pt to f/u in 2-3 weeks w/ counseling.   BARBARANN APPL, LCMHC

## 2024-02-11 ENCOUNTER — Ambulatory Visit: Admitting: Internal Medicine

## 2024-02-11 DIAGNOSIS — D519 Vitamin B12 deficiency anemia, unspecified: Secondary | ICD-10-CM

## 2024-02-11 MED ORDER — CYANOCOBALAMIN 1000 MCG/ML IJ SOLN
1000.0000 ug | Freq: Once | INTRAMUSCULAR | Status: AC
  Administered 2024-02-11: 1000 ug via INTRAMUSCULAR

## 2024-02-12 ENCOUNTER — Ambulatory Visit
Admission: RE | Admit: 2024-02-12 | Discharge: 2024-02-12 | Disposition: A | Discharge status: Home / Self Care | Source: Ambulatory Visit | Attending: Obstetrics and Gynecology | Admitting: Obstetrics and Gynecology

## 2024-02-12 DIAGNOSIS — R928 Other abnormal and inconclusive findings on diagnostic imaging of breast: Secondary | ICD-10-CM

## 2024-02-12 DIAGNOSIS — Z1231 Encounter for screening mammogram for malignant neoplasm of breast: Secondary | ICD-10-CM

## 2024-02-12 NOTE — Telephone Encounter (Signed)
 Sandra Bonilla from [phone]) has called to report that this medication (Teriflunomide  14 MG TABS ) is being transferred to [facility]

## 2024-02-15 ENCOUNTER — Other Ambulatory Visit: Payer: Self-pay | Admitting: Obstetrics and Gynecology

## 2024-02-15 ENCOUNTER — Ambulatory Visit: Payer: Self-pay | Admitting: Obstetrics and Gynecology

## 2024-02-15 DIAGNOSIS — R928 Other abnormal and inconclusive findings on diagnostic imaging of breast: Secondary | ICD-10-CM

## 2024-02-18 ENCOUNTER — Encounter: Payer: Self-pay | Admitting: Internal Medicine

## 2024-02-18 ENCOUNTER — Other Ambulatory Visit: Payer: Self-pay | Admitting: Cardiology

## 2024-02-18 ENCOUNTER — Ambulatory Visit: Admitting: Cardiology

## 2024-02-18 ENCOUNTER — Ambulatory Visit: Payer: Self-pay | Admitting: Cardiology

## 2024-02-18 DIAGNOSIS — R6889 Other general symptoms and signs: Secondary | ICD-10-CM

## 2024-02-18 LAB — POCT RAPID INFLUENZA A&B
FLU A: POSITIVE
FLU B: NEGATIVE

## 2024-02-18 MED ORDER — OSELTAMIVIR PHOSPHATE 75 MG PO CAPS: 75.0000 mg | ORAL_CAPSULE | Freq: Two times a day (BID) | ORAL | 0 refills | Status: AC

## 2024-02-19 MED ORDER — ONDANSETRON 4 MG PO TBDP: 4.0000 mg | ORAL_TABLET | Freq: Four times a day (QID) | ORAL | 0 refills | Status: AC | PRN

## 2024-02-22 ENCOUNTER — Ambulatory Visit: Admitting: Internal Medicine

## 2024-02-22 VITALS — BP 84/54 | HR 128 | Ht 66.0 in | Wt 190.0 lb

## 2024-02-22 DIAGNOSIS — J111 Influenza due to unidentified influenza virus with other respiratory manifestations: Secondary | ICD-10-CM

## 2024-02-22 DIAGNOSIS — E861 Hypovolemia: Secondary | ICD-10-CM

## 2024-02-22 MED ORDER — ZEPBOUND 10 MG/0.5ML ~~LOC~~ SOAJ: 10.0000 mg | SUBCUTANEOUS | 1 refills | Status: AC

## 2024-02-22 NOTE — Progress Notes (Unsigned)
 Established Patient Office Visit  Subjective:  Patient ID: Sandra Bonilla, female    DOB: 1975-07-03  Age: 48 y.o. MRN: [id number]  Chief Complaint  Patient presents with   Follow-up    2 month follow up    Diagnosed with influenza and currently on Tamiflu . C/o lethargy and dizziness of note initial bp today was 84/54 today.    No other concerns at this time.   Past Medical History:  Diagnosis Date   Anemia 05/2019   iron  infusions   Anxiety    Asthma    Cervical high risk human papillomavirus (HPV) DNA test positive 09/2015   Cholecystitis 2006   GALBLADDER REMOVED   Depression    Diverticulitis    Diverticulosis    Dysplastic nevus 08/24/2008   right upper back lat, right upper back medial   Dysplastic nevus 04/21/2022   upper back medial to middlescapula, mild atypia   Fibroadenoma 2016   RIGHT BREAST   GERD (gastroesophageal reflux disease)    Head injury 06/25/2016   History of mammogram 11/2014   FIBROADENOMA   History of Papanicolaou smear of cervix 01/24/13; 10/15/15   -/-; -/+   Hypertension    Menorrhagia 02/01/2010   D/C HYSTEROSCOPY ENDO POLYP   MS (multiple sclerosis)    Neuromuscular disorder (HCC)    Ovarian cyst 9/15; 2015-2016   LEFT - WENT TO [place]; RIGHT   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   Sleep apnea    WEAR C PAP    Past Surgical History:  Procedure Laterality Date   BIOPSY  03/21/2019   Procedure: BIOPSY;  Surgeon: Wilhelmenia Aloha Raddle., MD;  Location: [facility] ENDOSCOPY;  Service: Gastroenterology;;   BREAST BIOPSY Right 2016   Fibroadenoma   CHOLECYSTECTOMY  2006   COLON RESECTION N/A 08/11/2019   Procedure: DIAGNOSTIC LAPAROSCOPY WITH WASH OUT, DRAIN PLACEMENT, AND LOOP ILEOSTOMY CREATION;  Surgeon: Debby Hila, MD;  Location: [facility] ORS;  Service: General;  Laterality: N/A;   COLONOSCOPY WITH PROPOFOL  N/A 03/21/2019   Procedure: COLONOSCOPY WITH PROPOFOL ;  Surgeon: Wilhelmenia Aloha Raddle., MD;  Location: [facility]  ENDOSCOPY;  Service: Gastroenterology;  Laterality: N/A;  ultraslim colonoscope    CYSTOSCOPY WITH INJECTION N/A 08/04/2019   Procedure: FIREFLY INJECTION;  Surgeon: Cam Morene ORN, MD;  Location: [facility] ORS;  Service: Urology;  Laterality: N/A;   DILATION AND CURETTAGE, DIAGNOSTIC / THERAPEUTIC  02/01/2010   D/C HYSTEROSCOPY ENDO POLYP; [facility]   ENDOSCOPIC MUCOSAL RESECTION  03/21/2019   Procedure: ENDOSCOPIC MUCOSAL RESECTION;  Surgeon: Wilhelmenia Aloha Raddle., MD;  [facility] END[facility]COPY;  Service: Gastroenterology;;   ESOPHAGOGASTRODUODENOSCOPY (EGD) WITH PROPOFOL  N/A 03/21/2019   Procedure: ESOPHAGOGASTRODUODENOSCOPY (EGD) WITH PROPOFOL ;  Surgeon: Wilhelmenia Aloha Raddle., MD;  Location: [facility] ENDOSCOPY;  Service: Gastroenterology;  Laterality: N/A;   EYE SURGERY     Lasik   FLEXIBLE SIGMOIDOSCOPY  07/31/2016   Procedure: FLEXIBLE SIGMOIDOSCOPY;  Surgeon: Debby Hila, MD;  Location: [facility] ENDOSCOPY;  Service: Endoscopy;;   HEMOSTASIS CLIP PLACEMENT  03/21/2019   Procedure: HEMOSTASIS CLIP PLACEMENT;  Surgeon: Wilhelmenia Aloha Raddle., MD;  Location: [facility] ENDOSCOPY;  Service: Gastroenterology;;   HYSTEROSCOPY  02/01/2010   ENDO POLYP - [facility]   IR RADIOLOGIST EVAL & MGMT  11/16/2018   IR RADIOLOGIST EVAL & MGMT  09/06/2019   SCLEROTHERAPY  03/21/2019   Procedure: MATIAS;  Surgeon: Mansouraty, Aloha Raddle., MD;  Location: [facility] ENDOSCOPY;  Service: Gastroenterology;;    Social History   Socioeconomic History   Marital status: Divorced  Spouse name: Not on file   Number of children: 0   Years of education: 14   Highest education level: Not on file  Occupational History    Employer: [employer]  Tobacco Use   Smoking status: Former    Current packs/day: 0.00    Types: Cigarettes    Start date: 2000    Quit date: 2010    Years since quitting: 15.9   Smokeless tobacco: Never  Vaping Use   Vaping status: Never Used  Substance and Sexual Activity   Alcohol  use: No   Drug use: No    Sexual activity: Yes    Partners: Male    Birth control/protection: I.U.D.    Comment: Mirena   Other Topics Concern   Not on file  Social History Narrative   Not on file   Social Drivers of Health   Financial Resource Strain: Low Risk  (05/19/2023)   [facility]   Overall Financial Resource Strain (CARDIA)    Difficulty of Paying Living Expenses: Not hard at all  Food Insecurity: No Food Insecurity (05/19/2023)   Received from [facility]   Hunger Vital Sign    Within the past 12 months, you worried that your food would run out before you got the money to buy more.: Never true    Within the past 12 months, the food you bought just didn't last and you didn't have money to get more.: Never true  Transportation Needs: No Transportation Needs (05/19/2023)   Received from [facility] - Transportation    In the past 12 months, has lack of transportation kept you from medical appointments or from getting medications?: No    Lack of Transportation (Non-Medical): No  Physical Activity: Not on file  Stress: Not on file  Social Connections: Not on file  Intimate Partner Violence: Not on file    Family History  Problem Relation Age of Onset   Rheum arthritis Mother    Hypertension Mother    Thyroid  disease Mother        HYPOTHYROIDISM   Lung cancer Mother 6   Heart disease Father    Diabetes Father    Hypertension Father    Breast cancer Maternal Aunt 82       has contact   Cancer Paternal Grandmother        MELANOMA OF SKIN   Cancer Cousin        KIDNEY-COUSIN   Colon cancer Neg Hx    Stomach cancer Neg Hx    Pancreatic cancer Neg Hx    Esophageal cancer Neg Hx    Inflammatory bowel disease Neg Hx    Rectal cancer Neg Hx    Colon polyps Neg Hx    Crohn'Kenyatte Gruber disease Neg Hx    Ulcerative colitis Neg Hx    Multiple sclerosis Neg Hx     Allergies  Allergen Reactions   Sulfamethoxazole-Trimethoprim  Rash and Hives   Sulfa Antibiotics Other (See Comments) and Rash    Outpatient Medications Prior to Visit  Medication Sig   acetaminophen  (TYLENOL ) 325 MG tablet Take 2 tablets (650 mg total) by mouth every 6 (six) hours as needed for mild pain (or Fever >/= 101).   busPIRone  (BUSPAR ) 15 MG tablet Take 1 tablet (15 mg total) by mouth 2 (two) times daily.   chlorthalidone  (HYGROTON ) 25 MG tablet TAKE 1 TABLET BY MOUTH DAILY   cholecalciferol  (VITAMIN D3) 25 MCG (1000 UT)  tablet Take 1,000 Units by mouth daily.   Cyanocobalamin  (B-12 COMPLIANCE INJECTION IJ) Inject as directed.   cyclobenzaprine  (FLEXERIL ) 5 MG tablet Take 1 tablet (5 mg total) by mouth at bedtime as needed. (Patient taking differently: Take 5 mg by mouth at bedtime as needed for muscle spasms.)   esomeprazole  (NEXIUM ) 40 MG capsule TAKE 1 CAPSULE(40 MG) BY MOUTH DAILY   FLUoxetine  (PROZAC ) 10 MG capsule Take 1 capsule (10 mg total) by mouth daily.   fluticasone  (FLONASE) 50 MCG/ACT nasal spray Place into the nose.   gabapentin  (NEURONTIN ) 300 MG capsule TAKE 1 CAPSULE BY MOUTH IN THE  MORNING , 1 CAPSULE IN THE  EVENING AND 2 CAPSULES AT  BEDTIME   levonorgestrel  (MIRENA ) 20 MCG/DAY IUD 1 each by Intrauterine route once.   lidocaine  (LIDODERM ) 5 % Place 1 patch onto the skin daily. Remove & Discard patch within 12 hours or as directed by MD   LORazepam  (ATIVAN ) 1 MG tablet TAKE 1 TABLET BY MOUTH DAILY AS  NEEDED FOR ANXIETY   mirabegron  ER (MYRBETRIQ ) 25 MG TB24 tablet Take 1 tablet (25 mg total) by mouth daily.   modafinil  (PROVIGIL ) 200 MG tablet Take 1 tablet (200 mg total) by mouth daily.   mometasone  (ELOCON ) 0.1 % cream Apply 1 application topically daily as needed (Rash). Up to 5 times per week   ondansetron  (ZOFRAN -ODT) 4 MG disintegrating tablet Take 1 tablet (4 mg total) by mouth every 6 (six) hours as needed for nausea or vomiting.   oseltamivir  (TAMIFLU ) 75 MG capsule Take 1 capsule (75 mg total) by mouth 2 (two)  times daily for 5 days.   Polyvinyl Alcohol -Povidone (REFRESH OP) Place 2 drops into both eyes 4 (four) times daily as needed (dryness).   potassium chloride  SA (KLOR-CON  M) 20 MEQ tablet TAKE 1 TABLET(20 MEQ) BY MOUTH DAILY   Probiotic Product (PROBIOTIC PO) Take 1 capsule by mouth daily.   Teriflunomide  14 MG TABS Take 1 tablet (14 mg total) by mouth daily.   TRELEGY ELLIPTA 200-62.5-25 MCG/ACT AEPB Inhale 1 puff into the lungs daily.   [DISCONTINUED] tirzepatide  (ZEPBOUND ) 10 MG/0.5ML Pen Inject 10 mg into the skin once a week.   No facility-administered medications prior to visit.    Review of Systems  Constitutional:  Positive for weight loss (16 lbs).  Gastrointestinal:  Positive for nausea. Negative for diarrhea and vomiting.       Objective:   BP (!) 84/54   Pulse (!) 128   Ht 5' 6 (1.676 m)   Wt 190 lb (86.2 kg)   SpO2 99%   BMI 30.67 kg/m   Vitals:   02/22/24 1314  BP: (!) 84/54  Pulse: (!) 128  Height: 5' 6 (1.676 m)  Weight: 190 lb (86.2 kg)  SpO2: 99%  BMI (Calculated): 30.68    Physical Exam Vitals reviewed.  Constitutional:      General: She is not in acute distress.    Appearance: She is obese.  HENT:     Head: Normocephalic.     Nose: Nose normal.     Mouth/Throat:     Mouth: Mucous membranes are dry.  Eyes:     Extraocular Movements: Extraocular movements intact.     Pupils: Pupils are equal, round, and reactive to light.  Cardiovascular:     Rate and Rhythm: Normal rate and regular rhythm.     Heart sounds: No murmur heard. Pulmonary:     Effort: Pulmonary effort is normal.  Breath sounds: No rhonchi or rales.  Abdominal:     General: Abdomen is flat.     Palpations: There is no hepatomegaly, splenomegaly or mass.  Musculoskeletal:        General: Normal range of motion.     Cervical back: Normal range of motion. No tenderness.  Skin:    General: Skin is warm and dry.  Neurological:     General: No focal deficit present.      Mental Status: She is alert and oriented to person, place, and time.     Cranial Nerves: No cranial nerve deficit.     Motor: No weakness.  Psychiatric:        Mood and Affect: Mood normal.        Behavior: Behavior normal.      No results found for any visits on 02/22/24.  Recent Results (from the past 2160 hours)  CBC     Status: Abnormal   Collection Time: 01/15/24 10:29 AM  Result Value Ref Range   WBC 6.9 3.4 - 10.8 x10E3/uL   RBC 4.19 3.77 - 5.28 x10E6/uL   Hemoglobin 11.9 11.1 - 15.9 g/dL   Hematocrit 62.0 65.9 - 46.6 %   MCV 91 79 - 97 fL   MCH 28.4 26.6 - 33.0 pg   MCHC 31.4 (L) 31.5 - 35.7 g/dL   RDW 85.1 88.2 - 84.5 %   Platelets 275 150 - 450 x10E3/uL  Hepatic function panel     Status: None   Collection Time: 01/15/24 10:29 AM  Result Value Ref Range   Total Protein 6.4 6.0 - 8.5 g/dL   Albumin 4.0 3.9 - 4.9 g/dL   Bilirubin Total 0.6 0.0 - 1.2 mg/dL   Bilirubin, Direct 9.76 0.00 - 0.40 mg/dL   Alkaline Phosphatase 105 41 - 116 IU/L   AST 19 0 - 40 IU/L   ALT 11 0 - 32 IU/L  B12 and Folate Panel     Status: None   Collection Time: 01/15/24 10:29 AM  Result Value Ref Range   Vitamin B-12 568 232 - 1,245 pg/mL   Folate >20.0 >3.0 ng/mL    Comment: A serum folate concentration of less than 3.1 ng/mL is considered to represent clinical deficiency.   Anti-parietal antibody     Status: None   Collection Time: 01/15/24 10:29 AM  Result Value Ref Range   Parietal Cell Ab 1.0 0.0 - 20.0 Units    Comment:                                 Negative    0.0 - 20.0                                 Equivocal  20.1 - 24.9                                 Positive         >24.9 Parietal Cell Antibodies are found in 90% of patients with pernicious anemia and 30% of first degree relatives with pernicious anemia.   Intrinsic Factor Antibodies     Status: None   Collection Time: 01/15/24 10:29 AM  Result Value Ref Range   Intrinsic Factor Abs, Serum 1.0 0.0 - 1.1 AU/mL   Gamma GT  Status: None   Collection Time: 01/15/24 10:29 AM  Result Value Ref Range   GGT 23 0 - 60 IU/L  IGP, Aptima HPV     Status: None   Collection Time: 01/25/24  4:33 PM  Result Value Ref Range   Interpretation NILM     Comment: NEGATIVE FOR INTRAEPITHELIAL LESION OR MALIGNANCY.   Category NIL     Comment: Negative for Intraepithelial Lesion   Adequacy ENDO     Comment: Satisfactory for evaluation. Endocervical and/or squamous metaplastic cells (endocervical component) are present.    Clinician Provided ICD10 Comment     Comment: Z12.4 Z11.51    Performed by: Comment     Comment: Doyal Hora, Cytologist (ASCP)   Note: Comment     Comment: The Pap smear is a screening test designed to aid in the detection of premalignant and malignant conditions of the uterine cervix.  It is not a diagnostic procedure and should not be used as the sole means of detecting cervical cancer.  Both false-positive and false-negative reports do occur.    Test Methodology Comment     Comment: This liquid based ThinPrep(R) pap test was interpreted using the Hologic(R) Genius(TM) Cervical Algorithm whole slide imaging system.    HPV Aptima Negative Negative    Comment: This nucleic acid amplification test detects fourteen high-risk HPV types (16,18,31,33,35,39,45,51,52,56,58,59,66,68) without differentiation.   POCT Rapid Influenza A&B     Status: Abnormal   Collection Time: 02/18/24 12:12 PM  Result Value Ref Range   FLU A Positive    FLU B Negative       Assessment & Plan:  Ahmia was seen today for follow-up.  Influenza  Morbid obesity (HCC) -     Zepbound ; Inject 10 mg into the skin once a week.  Dispense: 2 mL; Refill: 1  Hypotension due to hypovolemia    Problem List Items Addressed This Visit       Other   Morbid obesity (HCC)   Relevant Medications   tirzepatide  (ZEPBOUND ) 10 MG/0.5ML Pen   Other Visit Diagnoses       Influenza    -  Primary      Hypotension due to hypovolemia           Return in about 2 months (around 04/23/2024) for Weight management.   Total time spent: 30 minutes. This time includes review of previous notes and results and patient face to face interaction during today'Keilyn Nadal visit.    Sherrill Cinderella Perry, MD  02/22/2024   This document may have been prepared by [facility] Voice Recognition software and as such may include unintentional dictation errors.

## 2024-02-23 ENCOUNTER — Ambulatory Visit: Admission: RE | Admit: 2024-02-23 | Source: Ambulatory Visit

## 2024-02-23 ENCOUNTER — Ambulatory Visit: Payer: Self-pay | Admitting: Internal Medicine

## 2024-02-23 ENCOUNTER — Encounter

## 2024-02-23 ENCOUNTER — Other Ambulatory Visit: Payer: Self-pay | Admitting: Internal Medicine

## 2024-02-23 ENCOUNTER — Telehealth: Payer: Self-pay

## 2024-02-23 DIAGNOSIS — M545 Low back pain, unspecified: Secondary | ICD-10-CM

## 2024-02-23 LAB — COMPREHENSIVE METABOLIC PANEL WITH GFR
ALT: 39 IU/L — ABNORMAL HIGH (ref 0–32)
AST: 59 IU/L — ABNORMAL HIGH (ref 0–40)
Albumin: 4 g/dL (ref 3.9–4.9)
Alkaline Phosphatase: 110 IU/L (ref 41–116)
BUN/Creatinine Ratio: 14 (ref 9–23)
BUN: 18 mg/dL (ref 6–24)
Bilirubin Total: 0.8 mg/dL (ref 0.0–1.2)
CO2: 22 mmol/L (ref 20–29)
Calcium: 9 mg/dL (ref 8.7–10.2)
Chloride: 95 mmol/L — ABNORMAL LOW (ref 96–106)
Creatinine, Ser: 1.25 mg/dL — ABNORMAL HIGH (ref 0.57–1.00)
Globulin, Total: 2.9 g/dL (ref 1.5–4.5)
Glucose: 100 mg/dL — ABNORMAL HIGH (ref 70–99)
Potassium: 3.5 mmol/L (ref 3.5–5.2)
Sodium: 135 mmol/L (ref 134–144)
Total Protein: 6.9 g/dL (ref 6.0–8.5)
eGFR: 53 mL/min/1.73 — ABNORMAL LOW (ref >59)

## 2024-02-23 LAB — CBC WITH DIFF/PLATELET
Basophils Absolute: 0 x10E3/uL (ref 0.0–0.2)
Basos: 1 %
EOS (ABSOLUTE): 0.1 x10E3/uL (ref 0.0–0.4)
Eos: 1 %
Hematocrit: 41.3 % (ref 34.0–46.6)
Hemoglobin: 13.5 g/dL (ref 11.1–15.9)
Immature Grans (Abs): 0 x10E3/uL (ref 0.0–0.1)
Immature Granulocytes: 0 %
Lymphocytes Absolute: 1.2 x10E3/uL (ref 0.7–3.1)
Lymphs: 24 %
MCH: 28 pg (ref 26.6–33.0)
MCHC: 32.7 g/dL (ref 31.5–35.7)
MCV: 86 fL (ref 79–97)
Monocytes Absolute: 0.8 x10E3/uL (ref 0.1–0.9)
Monocytes: 16 %
Neutrophils Absolute: 2.8 x10E3/uL (ref 1.4–7.0)
Neutrophils: 58 %
Platelets: 213 x10E3/uL (ref 150–450)
RBC: 4.83 x10E6/uL (ref 3.77–5.28)
RDW: 14.8 % (ref 11.7–15.4)
WBC: 4.9 x10E3/uL (ref 3.4–10.8)

## 2024-02-23 MED ORDER — TIZANIDINE HCL 4 MG PO TABS: 4.0000 mg | ORAL_TABLET | Freq: Three times a day (TID) | ORAL | 1 refills | Status: AC

## 2024-02-23 NOTE — Telephone Encounter (Signed)
 Pt states she has an extreme pulling pain in her left leg as of this morning. Pt cannot walk on it and has been bed bound today. Pt is wondering what she should do.

## 2024-02-23 NOTE — Telephone Encounter (Signed)
Pt informed

## 2024-02-23 NOTE — Progress Notes (Signed)
Pt informed

## 2024-03-03 ENCOUNTER — Other Ambulatory Visit: Payer: Self-pay | Admitting: Neurology

## 2024-03-03 NOTE — Telephone Encounter (Signed)
 Last seen on 09/03/23 Follow up scheduled on 04/12/24   Dispensed Days Supply Quantity Provider Pharmacy  MODAFINIL  200MG  TABLETS 02/04/2024 30 30 each Onita Duos, MD [facility]     Rx pending to signed

## 2024-03-04 NOTE — Progress Notes (Signed)
 A user error has taken place: encounter opened in error, closed for administrative reasons.

## 2024-03-07 ENCOUNTER — Telehealth: Payer: Self-pay | Admitting: Pharmacist

## 2024-03-07 ENCOUNTER — Ambulatory Visit: Admitting: Psychology

## 2024-03-07 DIAGNOSIS — F411 Generalized anxiety disorder: Secondary | ICD-10-CM

## 2024-03-07 NOTE — Telephone Encounter (Signed)
 Pharmacy Patient Advocate Encounter   Received notification from Patient Pharmacy that prior authorization for Modafinil  200MG  tablets is required/requested.   Insurance verification completed.   The patient is insured through [facility].   Per test claim: PA required; PA submitted to above mentioned insurance via Latent Key/confirmation #/EOC [id number] Status is pending

## 2024-03-07 NOTE — Progress Notes (Addendum)
 [facility] Counselor/Therapist Progress Note  Patient ID: ELIJAH MICHAELIS, MRN: [id number],    Date: 03/07/2024  Time Spent: 3:31pm-4:18pm  Pt is seen for a virtual video visit via caregility.  Pt joins from her home, reporting privacy, and counselor from her office.  Pt consents to virtual visit and is aware of limitations.      Treatment Type: Individual Therapy  Reported Symptoms: some anxiety w/ medical procedure.  Pt reports recent stress w/ being sick.  Mental Status Exam: Appearance:  Well Groomed     Behavior: Appropriate  Motor: Normal  Speech/Language:  Clear and Coherent and Normal Rate  Affect: Appropriate  Mood: anxious  Thought process: normal  Thought content:   WNL  Sensory/Perceptual disturbances:   WNL  Orientation: oriented to person, place, time/date, and situation  Attention: Good  Concentration: Good  Memory: WNL  Fund of knowledge:  Good  Insight:   Good  Judgment:  Good  Impulse Control: Good   Risk Assessment: Danger to Self:  No Self-injurious Behavior: No Danger to Others: No Duty to Warn:no Physical Aggression / Violence:No  Access to Firearms a concern: No  Gang Involvement:No   Subjective: counselor assessed pt current functioning per pt report.  Processed w/pt stressors, positives and anxiety.  Explored stress of flu and continuing to recovery.  Encouraged continued self care strategies and boundaries.   Discussed anxious thoughts w/ biopsy having and focus on what facts are.  Pt affect wnl.  Pt reports the week before thanksgiving didn't feel well and dx w/ flu.  Pt reports worst case she ever had.  Pt reports still not back to full energy.  Pt reports she is setting boundaries at work and leaving for her appointments as scheduled.  Pt reports she is looking forward to trip w/ friend this weekend and than week off at home prior to Christmas.  Pt has some worry and rumination about lump that has grown in breast- she has biopsy  for next week.  Pt being mindful to recognize taking steps needed w/ health care team to get dx.     Interventions: Cognitive Behavioral Therapy, Solution-Oriented/Positive Psychology, and supportive and self care  Diagnosis:Generalized anxiety disorder  Plan: Pt to f/u in 2-3 weeks w/ counseling.   Georjean Toya, [facility]                  [place], LCMHC

## 2024-03-07 NOTE — Progress Notes (Signed)
  Individualized Treatment Plan Strengths: enjoys family dinners, her dog, travel w/ friend and dinners w/ friends.   Supports: her brother, her cousin   Goal/Needs for Treatment:  In order of importance to patient 1) manage anxiety 2) --- 3) ---   Client Statement of Needs: Pt: reducing the anxiety and the stress.  Figure out ways to reduce   Treatment Level:outpatient counseling  Symptoms:anxiety, ruminating worry, feeling on edge, depressed moods.   Client Treatment Preferences:biweekly in person counseling.  Pt to f/u w/ PCP for medication management.    Healthcare consumer's goal for treatment:  Counselor, Damien Herald, [facility] will support the patient's ability to achieve the goals identified. Cognitive Behavioral Therapy, Assertive Communication/Conflict Resolution Training, Relaxation Training, ACT, Humanistic and other evidenced-based practices will be used to promote progress towards healthy functioning.   Healthcare consumer will: Actively participate in therapy, working towards healthy functioning.    *Justification for Continuation/Discontinuation of Goal: R=Revised, O=Ongoing, A=Achieved, D=Discontinued  Goal 1) Increase awareness anxious thought patterns and ways to challenge, reframe and redirect AEB pt report and therapist observation.  Baseline date 01/21/24: Progress towards goal 0; How Often - Daily Target Date Goal Was reviewed Status Code Progress towards goal/Likert rating  01/20/25                Goal 2) Increased relaxation, mindfulness and desscalation skills to assist in managing anxiety AEB pt report and therapist observation.  Baseline date 01/21/24: Progress towards goal 0; How Often - Daily Target Date Goal Was reviewed Status Code Progress towards goal  01/20/25                 This plan has been reviewed and created by the following participants:  This plan will be reviewed at least every 12 months. Date Behavioral Health Clinician Date  Guardian/Patient   01/21/24  [facility] Herald [facility] 01/21/24 Verbal Consent Provided and requested electronic signature                     HERALD DAMIEN [facility]               [place]

## 2024-03-07 NOTE — Telephone Encounter (Signed)
 Pharmacy Patient Advocate Encounter  Received notification from [employer] that Prior Authorization for modafinil  (PROVIGIL ) tablet has been APPROVED from 03/07/2024 to 09/05/2024   PA #/Case ID/Reference #: [id number]

## 2024-03-09 ENCOUNTER — Other Ambulatory Visit: Payer: Self-pay | Admitting: Internal Medicine

## 2024-03-09 DIAGNOSIS — F419 Anxiety disorder, unspecified: Secondary | ICD-10-CM

## 2024-03-10 ENCOUNTER — Ambulatory Visit: Admitting: Cardiology

## 2024-03-10 DIAGNOSIS — D519 Vitamin B12 deficiency anemia, unspecified: Secondary | ICD-10-CM | POA: Diagnosis not present

## 2024-03-10 MED ORDER — CYANOCOBALAMIN 1000 MCG/ML IJ SOLN
1000.0000 ug | Freq: Once | INTRAMUSCULAR | Status: AC
  Administered 2024-03-10: 1000 ug via INTRAMUSCULAR

## 2024-03-15 ENCOUNTER — Ambulatory Visit
Admission: RE | Admit: 2024-03-15 | Discharge: 2024-03-15 | Disposition: A | Source: Ambulatory Visit | Attending: Obstetrics and Gynecology

## 2024-03-15 ENCOUNTER — Inpatient Hospital Stay: Admission: RE | Admit: 2024-03-15 | Discharge: 2024-03-15 | Attending: Obstetrics and Gynecology

## 2024-03-15 ENCOUNTER — Encounter: Payer: Self-pay | Admitting: Neurology

## 2024-03-15 DIAGNOSIS — R928 Other abnormal and inconclusive findings on diagnostic imaging of breast: Secondary | ICD-10-CM

## 2024-03-15 DIAGNOSIS — D241 Benign neoplasm of right breast: Secondary | ICD-10-CM | POA: Diagnosis present

## 2024-03-15 HISTORY — PX: BREAST BIOPSY: SHX20

## 2024-03-15 MED ORDER — LIDOCAINE 1 % OPTIME INJ - NO CHARGE
2.0000 mL | Freq: Once | INTRAMUSCULAR | Status: AC
  Administered 2024-03-15: 13:00:00 2 mL
  Filled 2024-03-15: qty 2

## 2024-03-15 MED ORDER — LIDOCAINE-EPINEPHRINE 1 %-1:100000 IJ SOLN
5.0000 mL | Freq: Once | INTRAMUSCULAR | Status: AC
  Administered 2024-03-15: 13:00:00 5 mL

## 2024-03-16 ENCOUNTER — Encounter: Payer: Self-pay | Admitting: Internal Medicine

## 2024-03-16 LAB — SURGICAL PATHOLOGY

## 2024-03-18 ENCOUNTER — Ambulatory Visit: Payer: Self-pay | Admitting: Obstetrics and Gynecology

## 2024-03-18 ENCOUNTER — Other Ambulatory Visit: Payer: Self-pay | Admitting: Internal Medicine

## 2024-03-18 DIAGNOSIS — M545 Low back pain, unspecified: Secondary | ICD-10-CM

## 2024-03-21 ENCOUNTER — Encounter: Payer: Self-pay | Admitting: Internal Medicine

## 2024-03-21 ENCOUNTER — Ambulatory Visit: Admitting: Internal Medicine

## 2024-03-21 VITALS — BP 118/76 | HR 99 | Temp 97.7°F | Ht 66.0 in | Wt 193.6 lb

## 2024-03-21 DIAGNOSIS — M51369 Other intervertebral disc degeneration, lumbar region without mention of lumbar back pain or lower extremity pain: Secondary | ICD-10-CM | POA: Insufficient documentation

## 2024-03-21 DIAGNOSIS — Z0130 Encounter for examination of blood pressure without abnormal findings: Secondary | ICD-10-CM

## 2024-03-21 MED ORDER — NAPROXEN 500 MG PO TABS: 500.0000 mg | ORAL_TABLET | Freq: Two times a day (BID) | ORAL | 1 refills | Status: AC | PRN

## 2024-03-21 MED ORDER — METHYLPREDNISOLONE 4 MG PO TBPK: ORAL_TABLET | ORAL | 0 refills | Status: DC

## 2024-03-21 NOTE — Progress Notes (Signed)
 "  Established Patient Office Visit  Subjective:  Patient ID: Sandra Bonilla, female    DOB: 09-01-1975  Age: 48 y.o. MRN: [id number]  Chief Complaint  Patient presents with   Acute Visit    Left leg pain. Leg and foot is burning.     C/o intermittent left leg numbness and burning. No known exacerbating or relieving factors. Fell in her shower 1 month ago.    No other concerns at this time.   Past Medical History:  Diagnosis Date   Anemia 05/2019   iron  infusions   Anxiety    Asthma    Cervical high risk human papillomavirus (HPV) DNA test positive 09/2015   Cholecystitis 2006   GALBLADDER REMOVED   Depression    Diverticulitis    Diverticulosis    Dysplastic nevus 08/24/2008   right upper back lat, right upper back medial   Dysplastic nevus 04/21/2022   upper back medial to middlescapula, mild atypia   Fibroadenoma 2016   RIGHT BREAST   GERD (gastroesophageal reflux disease)    Head injury 06/25/2016   History of mammogram 11/2014   FIBROADENOMA   History of Papanicolaou smear of cervix 01/24/13; 10/15/15   -/-; -/+   Hypertension    Menorrhagia 02/01/2010   D/C HYSTEROSCOPY ENDO POLYP   MS (multiple sclerosis)    Neuromuscular disorder (HCC)    Ovarian cyst 9/15; 2015-2016   LEFT - WENT TO [place]; RIGHT   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   Sleep apnea    WEAR C PAP    Past Surgical History:  Procedure Laterality Date   BIOPSY  03/21/2019   Procedure: BIOPSY;  Surgeon: Wilhelmenia Aloha Raddle., MD;  Location: [facility] ENDOSCOPY;  Service: Gastroenterology;;   BREAST BIOPSY Right 2016   Fibroadenoma   BREAST BIOPSY Right 03/15/2024   US  RT BREAST BX W LOC DEV 1ST LESION IMG BX SPEC US  GUIDE 03/15/2024 [facility]MO[facility]   CHOLECYSTECTOMY  2006   COLON RESECTION N/A 08/11/2019   Procedure: DIAGNOSTIC LAPAROSCOPY WITH WASH OUT, DRAIN PLACEMENT, AND LOOP ILEOSTOMY CREATION;  Surgeon: Debby Hila, MD;  Location: [facility] ORS;  Service: General;  Laterality:  N/A;   COLONOSCOPY WITH PROPOFOL  N/A 03/21/2019   Procedure: COLONOSCOPY WITH PROPOFOL ;  Surgeon: Wilhelmenia Aloha Raddle., MD;  Location: [facility] ENDOSCOPY;  Service: Gastroenterology;  Laterality: N/A;  ultraslim colonoscope    CYSTOSCOPY WITH INJECTION N/A 08/04/2019   Procedure: FIREFLY INJECTION;  Surgeon: Cam Morene ORN, MD;  Location: [facility] ORS;  Service: Urology;  Laterality: N/A;   DILATION AND CURETTAGE, DIAGNOSTIC / THERAPEUTIC  02/01/2010   D/C HYSTEROSCOPY ENDO POLYP; [facility]   ENDOSCOPIC MUCOSAL RESECTION  03/21/2019   Procedure: ENDOSCOPIC MUCOSAL RESECTION;  Surgeon: Wilhelmenia Aloha Raddle., MD;  Location: [facility] ENDOSCOPY;  Service: Gastroenterology;;   ESOPHAGOGASTRODUODENOSCOPY (EGD) WITH PROPOFOL  N/A 03/21/2019   Procedure: ESOPHAGOGASTRODUODENOSCOPY (EGD) WITH PROPOFOL ;  Surgeon: Wilhelmenia Aloha Raddle., MD;  Location: [facility] ENDOSCOPY;  Service: Gastroenterology;  Laterality: N/A;   EYE SURGERY     Lasik   FLEXIBLE SIGMOIDOSCOPY  07/31/2016   Procedure: FLEXIBLE SIGMOIDOSCOPY;  Surgeon: Debby Hila, MD;  Location: [facility] ENDOSCOPY;  Service: Endoscopy;;   HEMOSTASIS CLIP PLACEMENT  03/21/2019   Procedure: HEMOSTASIS CLIP PLACEMENT;  Surgeon: Wilhelmenia Aloha Raddle., MD;  Location: [facility] ENDOSCOPY;  Service: Gastroenterology;;   HYSTEROSCOPY  02/01/2010   ENDO POLYP - [facility]   IR RADIOLOGIST EVAL & MGMT  11/16/2018   IR RADIOLOGIST EVAL & MGMT  09/06/2019   SCLEROTHERAPY  03/21/2019  Procedure: SCLEROTHERAPY;  Surgeon: Mansouraty, Aloha Raddle., MD;  Location: [facility] ENDOSCOPY;  Service: Gastroenterology;;    Social History   Socioeconomic History   Marital status: Divorced    Spouse name: Not on file   Number of children: 0   Years of education: 14   Highest education level: Not on file  Occupational History    Employer: [employer]  Tobacco Use   Smoking status: Former    Current packs/day: 0.00    Types: Cigarettes    Start date: 2000    Quit date: 2010    Years since quitting: 15.9    Smokeless tobacco: Never  Vaping Use   Vaping status: Never Used  Substance and Sexual Activity   Alcohol  use: No   Drug use: No   Sexual activity: Yes    Partners: Male    Birth control/protection: I.U.D.    Comment: Mirena   Other Topics Concern   Not on file  Social History Narrative   Not on file   Social Drivers of Health   Tobacco Use: Medium Risk (03/21/2024)   Patient History    Smoking Tobacco Use: Former    Smokeless Tobacco Use: Never    Passive Exposure: Not on file  Financial Resource Strain: Low Risk  (05/19/2023)   Received from [facility]   Overall Financial Resource Strain (CARDIA)    Difficulty of Paying Living Expenses: Not hard at all  Food Insecurity: No Food Insecurity (05/19/2023)   Received from [facility]   Epic    Within the past 12 months, you worried that your food would run out before you got the money to buy more.: Never true    Within the past 12 months, the food you bought just didn't last and you didn't have money to get more.: Never true  Transportation Needs: No Transportation Needs (05/19/2023)   Received from [facility] - Transportation    In the past 12 months, has lack of transportation kept you from medical appointments or from getting medications?: No    Lack of Transportation (Non-Medical): No  Physical Activity: Not on file  Stress: Not on file  Social Connections: Not on file  Intimate Partner Violence: Not on file  Depression (EYV7-0): Not on file  Alcohol  Screen: Not on file  Housing: Low Risk  (11/24/2023)   Received from [facility]   Epic    At any time in the past 12 months, were you homeless or living in a shelter (including now)?: No    In the last 12 months, was there a time when you were not able to pay the mortgage or rent on time?: No    In the past 12 months, how many times have you moved where you were living?: 0  Utilities: Not  At Risk (05/19/2023)   Received from [facility] Utilities    Threatened with loss of utilities: No  Health Literacy: Not on file    Family History  Problem Relation Age of Onset   Rheum arthritis Mother    Hypertension Mother    Thyroid  disease Mother        HYPOTHYROIDISM   Lung cancer Mother 21   Heart disease Father    Diabetes Father    Hypertension Father    Breast cancer Maternal Aunt 26       has contact   Cancer Paternal Grandmother  MELANOMA OF SKIN   Cancer Cousin        KIDNEY-COUSIN   Colon cancer Neg Hx    Stomach cancer Neg Hx    Pancreatic cancer Neg Hx    Esophageal cancer Neg Hx    Inflammatory bowel disease Neg Hx    Rectal cancer Neg Hx    Colon polyps Neg Hx    Crohn'Anniemae Haberkorn disease Neg Hx    Ulcerative colitis Neg Hx    Multiple sclerosis Neg Hx     Allergies[1]  Show/hide medication list[2]  Review of Systems  Constitutional:  Negative for malaise/fatigue.  HENT: Negative.    Eyes: Negative.   Respiratory: Negative.    Cardiovascular: Negative.   Gastrointestinal: Negative.   Genitourinary: Negative.   Skin: Negative.   Neurological:  Positive for sensory change. Negative for focal weakness and weakness.  Endo/Heme/Allergies: Negative.   Psychiatric/Behavioral:  The patient does not have insomnia (daytime somnolence).        Objective:   BP 118/76   Pulse 99   Temp 97.7 F (36.5 C)   Ht 5' 6 (1.676 m)   Wt 193 lb 9.6 oz (87.8 kg)   SpO2 94%   BMI 31.25 kg/m   Vitals:   03/21/24 1256  BP: 118/76  Pulse: 99  Temp: 97.7 F (36.5 C)  Height: 5' 6 (1.676 m)  Weight: 193 lb 9.6 oz (87.8 kg)  SpO2: 94%  BMI (Calculated): 31.26    Physical Exam Vitals reviewed.  Constitutional:      General: She is not in acute distress.    Appearance: She is obese.  HENT:     Head: Normocephalic.     Nose: Nose normal.     Mouth/Throat:     Mouth: Mucous membranes are moist.  Eyes:     Extraocular  Movements: Extraocular movements intact.     Pupils: Pupils are equal, round, and reactive to light.  Cardiovascular:     Rate and Rhythm: Normal rate and regular rhythm.     Heart sounds: No murmur heard. Pulmonary:     Effort: Pulmonary effort is normal.     Breath sounds: No rhonchi or rales.  Abdominal:     General: Abdomen is flat.     Palpations: There is no hepatomegaly, splenomegaly or mass.  Musculoskeletal:     Cervical back: Normal range of motion. No tenderness.     Lumbar back: No bony tenderness. Positive left straight leg raise test. Negative right straight leg raise test.  Skin:    General: Skin is warm and dry.  Neurological:     General: No focal deficit present.     Mental Status: She is alert and oriented to person, place, and time.     Cranial Nerves: No cranial nerve deficit.     Motor: No weakness.  Psychiatric:        Mood and Affect: Mood normal.        Behavior: Behavior normal.      No results found for any visits on 03/21/24.      Assessment & Plan:  Mercedes was seen today for acute visit.  Bulging lumbar disc    Problem List Items Addressed This Visit       Musculoskeletal and Integument   Bulging lumbar disc - Primary    Return if symptoms worsen or fail to improve.   Total time spent: 30 minutes. This time includes review of previous notes and results and patient face to  face interaction during today'Ebelyn Bohnet visit.    Sherrill Cinderella Perry, MD  03/21/2024   This document may have been prepared by [facility] Voice Recognition software and as such may include unintentional dictation errors.     [1]  Allergies Allergen Reactions   Sulfamethoxazole-Trimethoprim Rash and Hives   Sulfa Antibiotics Other (See Comments) and Rash  [2]  Outpatient Medications Prior to Visit  Medication Sig   acetaminophen  (TYLENOL ) 325 MG tablet Take 2 tablets (650 mg total) by mouth every 6 (six) hours as needed for mild pain (or Fever >/= 101).    busPIRone  (BUSPAR ) 15 MG tablet Take 1 tablet (15 mg total) by mouth 2 (two) times daily.   chlorthalidone  (HYGROTON ) 25 MG tablet TAKE 1 TABLET BY MOUTH DAILY   cholecalciferol  (VITAMIN D3) 25 MCG (1000 UT) tablet Take 1,000 Units by mouth daily.   Cyanocobalamin  (B-12 COMPLIANCE INJECTION IJ) Inject as directed.   esomeprazole  (NEXIUM ) 40 MG capsule TAKE 1 CAPSULE(40 MG) BY MOUTH DAILY   FLUoxetine  (PROZAC ) 10 MG capsule Take 1 capsule (10 mg total) by mouth daily.   fluticasone  (FLONASE) 50 MCG/ACT nasal spray Place into the nose.   gabapentin  (NEURONTIN ) 300 MG capsule TAKE 1 CAPSULE BY MOUTH IN THE  MORNING , 1 CAPSULE IN THE  EVENING AND 2 CAPSULES AT  BEDTIME   levonorgestrel  (MIRENA ) 20 MCG/DAY IUD 1 each by Intrauterine route once.   lidocaine  (LIDODERM ) 5 % Place 1 patch onto the skin daily. Remove & Discard patch within 12 hours or as directed by MD   LORazepam  (ATIVAN ) 1 MG tablet TAKE 1 TABLET BY MOUTH DAILY AS  NEEDED FOR ANXIETY   mirabegron  ER (MYRBETRIQ ) 25 MG TB24 tablet Take 1 tablet (25 mg total) by mouth daily.   modafinil  (PROVIGIL ) 200 MG tablet TAKE 1 TABLET(200 MG) BY MOUTH DAILY   mometasone  (ELOCON ) 0.1 % cream Apply 1 application topically daily as needed (Rash). Up to 5 times per week   ondansetron  (ZOFRAN -ODT) 4 MG disintegrating tablet Take 1 tablet (4 mg total) by mouth every 6 (six) hours as needed for nausea or vomiting.   Polyvinyl Alcohol -Povidone (REFRESH OP) Place 2 drops into both eyes 4 (four) times daily as needed (dryness).   potassium chloride  SA (KLOR-CON  M) 20 MEQ tablet TAKE 1 TABLET(20 MEQ) BY MOUTH DAILY   Probiotic Product (PROBIOTIC PO) Take 1 capsule by mouth daily.   Teriflunomide  14 MG TABS Take 1 tablet (14 mg total) by mouth daily.   tirzepatide  (ZEPBOUND ) 10 MG/0.5ML Pen Inject 10 mg into the skin once a week.   tiZANidine  (ZANAFLEX ) 4 MG tablet TAKE 1 TABLET(4 MG) BY MOUTH THREE TIMES DAILY FOR 20 DAYS   TRELEGY ELLIPTA 200-62.5-25 MCG/ACT  AEPB Inhale 1 puff into the lungs daily.   No facility-administered medications prior to visit.   "

## 2024-03-23 ENCOUNTER — Telehealth: Payer: Self-pay

## 2024-03-23 ENCOUNTER — Other Ambulatory Visit (HOSPITAL_COMMUNITY): Payer: Self-pay

## 2024-03-23 NOTE — Telephone Encounter (Signed)
 Pharmacy Patient Advocate Encounter   Received notification from Fax that prior authorization for Teriflunomide  is required/requested.   Insurance verification completed.   The patient is insured through [facility].   Per test claim: PA required; PA submitted to above mentioned insurance via Latent Key/confirmation #/EOC [id number] Status is pending

## 2024-03-25 ENCOUNTER — Other Ambulatory Visit (HOSPITAL_COMMUNITY): Payer: Self-pay

## 2024-03-25 NOTE — Telephone Encounter (Signed)
 Received a request for add info via fax-faxed last two OV notes to [employer].

## 2024-03-25 NOTE — Telephone Encounter (Signed)
" ° ° ° °  Pharmacy Patient Advocate Encounter   Received notification from Fax that prior authorization for Teriflunomide  is required/requested.   Insurance verification completed.   The patient is insured through [employer].   Per test claim: PA required; PA submitted to above mentioned insurance via Fax Key/confirmation #/EOC [id number] Status is pending "

## 2024-03-28 ENCOUNTER — Ambulatory Visit: Admitting: Neurology

## 2024-03-28 ENCOUNTER — Encounter: Payer: Self-pay | Admitting: Neurology

## 2024-03-28 ENCOUNTER — Telehealth: Payer: Self-pay | Admitting: Neurology

## 2024-03-28 VITALS — BP 116/84 | HR 90 | Ht 66.0 in | Wt 191.0 lb

## 2024-03-28 DIAGNOSIS — E559 Vitamin D deficiency, unspecified: Secondary | ICD-10-CM

## 2024-03-28 DIAGNOSIS — M5416 Radiculopathy, lumbar region: Secondary | ICD-10-CM

## 2024-03-28 DIAGNOSIS — G4733 Obstructive sleep apnea (adult) (pediatric): Secondary | ICD-10-CM

## 2024-03-28 DIAGNOSIS — R269 Unspecified abnormalities of gait and mobility: Secondary | ICD-10-CM

## 2024-03-28 DIAGNOSIS — R208 Other disturbances of skin sensation: Secondary | ICD-10-CM

## 2024-03-28 DIAGNOSIS — G35A Relapsing-remitting multiple sclerosis: Secondary | ICD-10-CM

## 2024-03-28 DIAGNOSIS — Z79899 Other long term (current) drug therapy: Secondary | ICD-10-CM | POA: Diagnosis not present

## 2024-03-28 DIAGNOSIS — M51362 Other intervertebral disc degeneration, lumbar region with discogenic back pain and lower extremity pain: Secondary | ICD-10-CM | POA: Diagnosis not present

## 2024-03-28 MED ORDER — GABAPENTIN 300 MG PO CAPS: ORAL_CAPSULE | ORAL | 3 refills | Status: DC

## 2024-03-28 NOTE — Telephone Encounter (Signed)
 no auth required sent to [employer] [id number]

## 2024-03-28 NOTE — Progress Notes (Signed)
 "  [facility]  PATIENT: Sandra Bonilla DOB: Aug 04, 1975  REFERRING DOCTOR OR PCP:   Cinderella Perry SOURCE: patient and medical records  _________________________________   HISTORICAL  CHIEF COMPLAINT:  Chief Complaint  Patient presents with   Follow-up    Room 11 - had the Flu mid-November, blacked out and fRaell while taking a shower. Hit her back on the faucet. Notes radiating pain from the upper back down into the L LE, was temporarily unable to move the leg at the time. Continues to have difficulty with ambulation since fall. Notes intermittent n/t and burning sensation, worse with ambulation.     Numbness and tingling    x 7 months, B LE; weakness L UE. Taking Gabapentin  daily, Meloxicam  or Celebrex and Cyclobenzaprine  prn, rarely   Chronic fatigue   Dysesthesia   Urinary Frequency    Taking Mybetriq   OSA on CPAP     Managed by Pulmonology    Relapsing remitting multiple sclerosis    Taking Aubagio     HISTORY OF PRESENT ILLNESS:  Sandra Bonilla is a 48 y.o. 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     Update 03/28/2024 She had Infuenza A mid November.  She blacked out in the shower and fell with back hitting spigot.   The next week, she had a shooting pain down her left leg (entire leg).   She also had a burning sensation in her foot.  Balance seems worse.    Left leg is also going numb when she walks.  Since that episode, she also notes her gait is worse.  Although she does not have any MRI of the lumbar spine in the past few years, she did have a CT scan of the pelvis in 2021.  I personally reviewed the images.  At L3-L4 she has severe loss of disc height associated with endplate spurring, spinal stenosis and significant foraminal narrowing.  At L4-L5, she has what appears to be disc bulging, facet hypertrophy and spinal stenosis.  I suspect that her recent injury may have exacerbated her degenerative changes  She feels her MS is mostly stable and she  denies any exacerbation.   MRI 02/2020 showed no new lesions.   MRI 08/12/2022 showed no new lesions  She is on Aubagio  and tolerates it well.     Gait is a little off due to both balance and strength.  Sometimes she feels she veers to the right.   No falls.  She uses the rails on stairs. Numbness has resolved. She has urinary frequency and is on Myrbetriq . The oxybutynin  had stopped helping after a while ad she stopped.   No UTI's.    Vision is fine.         She has fatigue daily, seems worse since daylight savings change.  She notes insomnia.   Home study was done 03/01/2023 and showed mild OSA with AHI=11.9.   She started CPAP due to the EDS with mild improvement.   She changed to a nasal mask.   Excessive daytime sleepiness was improved more with modafinil .  She denies depression but has anxiety.  She is on Buspar  and lorazepam  for the anxiety.  She notes mild cognitive fog at times.   Mild reduced focus/attention  She has HA about once a week - no nausea or photophobia.    She is on computer much of the day.    EPWORTH SLEEPINESS SCALE  On a scale of 0 - 3 what is the chance of  dozing:  Sitting and Reading:   1 Watching TV:    1 Sitting inactive in a public place: 0 Passenger in car for one hour: 1 Lying down to rest in the afternoon: 2 Sitting and talking to someone: 0 Sitting quietly after lunch:  1 In a car, stopped in traffic:  0  Total (out of 24):   6/24   no  ESS (was 10 last visit after starting CPAP before modafinil )    MS HIstory:       About  2006, she noted gait changes and her right foot was clumsy.    She had an MRI consistent with MS and was referred to Dr. Vinetta [facility]) who diagnosed her with MS.   She then saw Dr. Danice and then Dr. Kearney at [facility].  She was reluctant to take a medication until  2012 years ago when she started Copaxone.   She had skin reactions, she stopped after one year and switched to Aubagio  (+/- late 2013).   She has tolerated it  well.     Imaging MRI brain 03/06/2020:  Multiple T2/FLAIR hyperintense foci in the periventricular, juxtacortical and deep white matter of both hemispheres.  Additional foci are noted in the spinal cord adjacent to C2, right cerebral peduncle and right cerebellar hemisphere.  None of the foci appear to be acute and they do not enhance.  Compared to the MRI dated 01/20/2018, there are no new lesions.  Small developmental venous anomaly in the right medial parietal lobe  MRI 07/2022 showed no new lesions   [facility] Sleep test 02/28/2022 showed mild OSA with pAHI3% of 11.9/h   REVIEW OF SYSTEMS: Constitutional: No fevers, chills, sweats, or change in appetite   She notes fatigue and poor sleep.    Eyes: No visual changes, double vision, eye pain Ear, nose and throat: No hearing loss, ear pain, nasal congestion, sore throat Cardiovascular: No chest pain, palpitations Respiratory:  No shortness of breath at rest or with exertion.   No wheezes.  Some snoring GastrointestinaI: No nausea, vomiting, diarrhea, abdominal pain, fecal incontinence Genitourinary:  No dysuria, urinary retention or frequency.  No nocturia. Musculoskeletal:  No neck pain, back pain Integumentary: No rash, pruritus, skin lesions Neurological: as above Psychiatric: No depression at this time. Some anxiety Endocrine: No palpitations, diaphoresis, change in appetite, change in weigh or increased thirst Hematologic/Lymphatic:  No anemia, purpura, petechiae. Allergic/Immunologic: No itchy/runny eyes, nasal congestion, recent allergic reactions, rashes  ALLERGIES: Allergies  Allergen Reactions   Sulfamethoxazole-Trimethoprim Rash and Hives   Sulfa Antibiotics Other (See Comments) and Rash    HOME MEDICATIONS:  Current Outpatient Medications:    acetaminophen  (TYLENOL ) 325 MG tablet, Take 2 tablets (650 mg total) by mouth every 6 (six) hours as needed for mild pain (or Fever >/= 101)., Disp: , Rfl:    busPIRone  (BUSPAR ) 15 MG  tablet, Take 1 tablet (15 mg total) by mouth 2 (two) times daily., Disp: 180 tablet, Rfl: 1   chlorthalidone  (HYGROTON ) 25 MG tablet, TAKE 1 TABLET BY MOUTH DAILY, Disp: 90 tablet, Rfl: 1   cholecalciferol  (VITAMIN D3) 25 MCG (1000 UT) tablet, Take 1,000 Units by mouth daily., Disp: , Rfl:    Cyanocobalamin  (B-12 COMPLIANCE INJECTION IJ), Inject as directed., Disp: , Rfl:    esomeprazole  (NEXIUM ) 40 MG capsule, TAKE 1 CAPSULE(40 MG) BY MOUTH DAILY, Disp: 90 capsule, Rfl: 4   FLUoxetine  (PROZAC ) 10 MG capsule, Take 1 capsule (10 mg total) by mouth daily., Disp: 90 capsule,  Rfl: 1   levonorgestrel  (MIRENA ) 20 MCG/DAY IUD, 1 each by Intrauterine route once., Disp: , Rfl:    lidocaine  (LIDODERM ) 5 %, Place 1 patch onto the skin daily. Remove & Discard patch within 12 hours or as directed by MD, Disp: 90 patch, Rfl: 0   LORazepam  (ATIVAN ) 1 MG tablet, TAKE 1 TABLET BY MOUTH DAILY AS  NEEDED FOR ANXIETY, Disp: 90 tablet, Rfl: 0   methylPREDNISolone (MEDROL DOSEPAK) 4 MG TBPK tablet, Use as directed, Disp: 21 tablet, Rfl: 0   mirabegron  ER (MYRBETRIQ ) 25 MG TB24 tablet, Take 1 tablet (25 mg total) by mouth daily., Disp: 90 tablet, Rfl: 1   modafinil  (PROVIGIL ) 200 MG tablet, TAKE 1 TABLET(200 MG) BY MOUTH DAILY, Disp: 30 tablet, Rfl: 5   naproxen  (NAPROSYN ) 500 MG tablet, Take 1 tablet (500 mg total) by mouth 2 (two) times daily as needed., Disp: 60 tablet, Rfl: 1   ondansetron  (ZOFRAN -ODT) 4 MG disintegrating tablet, Take 1 tablet (4 mg total) by mouth every 6 (six) hours as needed for nausea or vomiting., Disp: 30 tablet, Rfl: 0   Polyvinyl Alcohol -Povidone (REFRESH OP), Place 2 drops into both eyes 4 (four) times daily as needed (dryness)., Disp: , Rfl:    potassium chloride  SA (KLOR-CON  M) 20 MEQ tablet, TAKE 1 TABLET(20 MEQ) BY MOUTH DAILY, Disp: 90 tablet, Rfl: 1   Probiotic Product (PROBIOTIC PO), Take 1 capsule by mouth daily., Disp: , Rfl:    Teriflunomide  14 MG TABS, Take 1 tablet (14 mg total)  by mouth daily., Disp: 30 tablet, Rfl: 5   tirzepatide  (ZEPBOUND ) 10 MG/0.5ML Pen, Inject 10 mg into the skin once a week., Disp: 2 mL, Rfl: 1   tiZANidine  (ZANAFLEX ) 4 MG tablet, TAKE 1 TABLET(4 MG) BY MOUTH THREE TIMES DAILY FOR 20 DAYS, Disp: 30 tablet, Rfl: 1   TRELEGY ELLIPTA 200-62.5-25 MCG/ACT AEPB, Inhale 1 puff into the lungs daily., Disp: , Rfl:    fluticasone  (FLONASE) 50 MCG/ACT nasal spray, Place into the nose. (Patient not taking: Reported on 03/28/2024), Disp: , Rfl:    gabapentin  (NEURONTIN ) 300 MG capsule, TAKE 1 CAPSULE BY MOUTH IN THE  MORNING , 1 CAPSULE IN THE  EVENING AND 2 CAPSULES AT  BEDTIME, Disp: 360 capsule, Rfl: 3   mometasone  (ELOCON ) 0.1 % cream, Apply 1 application topically daily as needed (Rash). Up to 5 times per week (Patient not taking: Reported on 03/28/2024), Disp: 45 g, Rfl: 1  PAST MEDICAL HISTORY: Past Medical History:  Diagnosis Date   Anemia 05/2019   iron  infusions   Anxiety    Asthma    Cervical high risk human papillomavirus (HPV) DNA test positive 09/2015   Cholecystitis 2006   GALBLADDER REMOVED   Depression    Diverticulitis    Diverticulosis    Dysplastic nevus 08/24/2008   right upper back lat, right upper back medial   Dysplastic nevus 04/21/2022   upper back medial to middlescapula, mild atypia   Fibroadenoma 2016   RIGHT BREAST   GERD (gastroesophageal reflux disease)    Head injury 06/25/2016   History of mammogram 11/2014   FIBROADENOMA   History of Papanicolaou smear of cervix 01/24/13; 10/15/15   -/-; -/+   Hypertension    Menorrhagia 02/01/2010   D/C HYSTEROSCOPY ENDO POLYP   MS (multiple sclerosis)    Neuromuscular disorder (HCC)    Ovarian cyst 9/15; 2015-2016   LEFT - WENT TO [place]; RIGHT   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  Sleep apnea    WEAR C PAP    PAST SURGICAL HISTORY: Past Surgical History:  Procedure Laterality Date   BIOPSY  03/21/2019   Procedure: BIOPSY;  Surgeon: Wilhelmenia  Aloha Raddle., MD;  Location[facility]COPY;  Service: Gastroenterology;;   BREAST BIOPSY Right 2016   Fibroadenoma   BREAST BIOPSY Right 03/15/2024   US  RT BREAST BX W LOC DEV 1ST LESION IMG BX SPEC US  GUIDE 03/15/2024 [facility]   CHOLECYSTECTOMY  2006   COLON RESECTION N/A 08/11/2019   Procedure: DIAGNOSTIC LAPAROSCOPY WITH WASH OUT, DRAIN PLACEMENT, AND LOOP ILEOSTOMY CREATION;  Surgeon: Debby Hila, MD;  Location: [facility] ORS;  Service: General;  Laterality: N/A;   COLONOSCOPY WITH PROPOFOL  N/A 03/21/2019   Procedure: COLONOSCOPY WITH PROPOFOL ;  Surgeon: Wilhelmenia Aloha Raddle., MD;  Location: [facility] ENDOSCOPY;  Service: Gastroenterology;  Laterality: N/A;  ultraslim colonoscope    CYSTOSCOPY WITH INJECTION N/A 08/04/2019   Procedure: FIREFLY INJECTION;  Surgeon: Cam Morene ORN, MD;  Location: [facility];  Service: Urology;  Laterality: N/A;   DILATION AND CURETTAGE, DIAGNOSTIC / THERAPEUTIC  02/01/2010   D/C HYSTEROSCOPY ENDO POLYP; [facility]   ENDOSCOPIC MUCOSAL RESECTION  03/21/2019   Procedure: ENDOSCOPIC MUCOSAL RESECTION;  Surgeon: Wilhelmenia Aloha Raddle., MD;  Location: [facility] ENDOSCOPY;  Service: Gastroenterology;;   ESOPHAGOGASTRODUODENOSCOPY (EGD) WITH PROPOFOL  N/A 03/21/2019   Procedure: ESOPHAGOGASTRODUODENOSCOPY (EGD) WITH PROPOFOL ;  Surgeon: Wilhelmenia Aloha Raddle., MD;  Location: [facility] ENDOSCOPY;  Service: Gastroenterology;  Laterality: N/A;   EYE SURGERY     Lasik   FLEXIBLE SIGMOIDOSCOPY  07/31/2016   Procedure: FLEXIBLE SIGMOIDOSCOPY;  Surgeon: Debby Hila, MD;  Location: [facility] ENDOSCOPY;  Service: Endoscopy;;   HEMOSTASIS CLIP PLACEMENT  03/21/2019   Procedure: HEMOSTASIS CLIP PLACEMENT;  Surgeon: Wilhelmenia Aloha Raddle., MD;  Location: [facility] ENDOSCOPY;  Service: Gastroenterology;;   HYSTEROSCOPY  02/01/2010   ENDO POLYP - [facility]   IR RADIOLOGIST EVAL & MGMT  11/16/2018   IR RADIOLOGIST EVAL & MGMT  09/06/2019   SCLEROTHERAPY  03/21/2019   Procedure: MATIAS;  Surgeon:  Mansouraty, Aloha Raddle., MD;  Location: [facility] ENDOSCOPY;  Service: Gastroenterology;;    FAMILY HISTORY: Family History  Problem Relation Age of Onset   Rheum arthritis Mother    Hypertension Mother    Thyroid  disease Mother        HYPOTHYROIDISM   Lung cancer Mother 35   Heart disease Father    Diabetes Father    Hypertension Father    Breast cancer Maternal Aunt 47       has contact   Cancer Paternal Grandmother        MELANOMA OF SKIN   Cancer Cousin        KIDNEY-COUSIN   Colon cancer Neg Hx    Stomach cancer Neg Hx    Pancreatic cancer Neg Hx    Esophageal cancer Neg Hx    Inflammatory bowel disease Neg Hx    Rectal cancer Neg Hx    Colon polyps Neg Hx    Crohn's disease Neg Hx    Ulcerative colitis Neg Hx    Multiple sclerosis Neg Hx     SOCIAL HISTORY:  Social History   Socioeconomic History   Marital status: Divorced    Spouse name: Not on file   Number of children: 0   Years of education: 14   Highest education level: Not on file  Occupational History    Employer: [employer]  Tobacco Use   Smoking status: Former    Current packs/day:  0.00    Types: Cigarettes    Start date: 2000    Quit date: 2010    Years since quitting: 16.0   Smokeless tobacco: Never  Vaping Use   Vaping status: Never Used  Substance and Sexual Activity   Alcohol  use: No   Drug use: No   Sexual activity: Yes    Partners: Male    Birth control/protection: I.U.D.    Comment: Mirena   Other Topics Concern   Not on file  Social History Narrative   Not on file   Social Drivers of Health   Tobacco Use: Medium Risk (03/28/2024)   Patient History    Smoking Tobacco Use: Former    Smokeless Tobacco Use: Never    Passive Exposure: Not on file  Financial Resource Strain: Low Risk  (05/19/2023)   Received from [facility]   Overall Financial Resource Strain (CARDIA)    Difficulty of Paying Living Expenses: Not hard at all  Food Insecurity: No Food Insecurity  (05/19/2023)   Received from [facility]   Epic    Within the past 12 months, you worried that your food would run out before you got the money to buy more.: Never true    Within the past 12 months, the food you bought just didn't last and you didn't have money to get more.: Never true  Transportation Needs: No Transportation Needs (05/19/2023)   Received from [facility] - Transportation    In the past 12 months, has lack of transportation kept you from medical appointments or from getting medications?: No    Lack of Transportation (Non-Medical): No  Physical Activity: Not on file  Stress: Not on file  Social Connections: Not on file  Intimate Partner Violence: Not on file  Depression (EYV7-0): Not on file  Alcohol  Screen: Not on file  Housing: Low Risk  (11/24/2023)   Received from [facility]   Epic    At any time in the past 12 months, were you homeless or living in a shelter (including now)?: No    In the last 12 months, was there a time when you were not able to pay the mortgage or rent on time?: No    In the past 12 months, how many times have you moved where you were living?: 0  Utilities: Not At Risk (05/19/2023)   Received from [facility] Utilities    Threatened with loss of utilities: No  Health Literacy: Not on file     PHYSICAL EXAM  Vitals:   03/28/24 1510  BP: 116/84  Pulse: 90  SpO2: 93%  Weight: 191 lb (86.6 kg)  Height: 5' 6 (1.676 m)     Body mass index is 30.83 kg/m.   General: The patient is well-developed and well-nourished and in no acute distress.     Neurologic Exam  Mental status: The patient is alert and oriented x 3 at the time of the examination. The patient has apparent normal recent and remote memory, with an apparently normal attention span and concentration ability.   Speech is normal.  Cranial nerves: Extraocular muscles are normal.  Facial  strength and sensation are normal.   Trapezius is strong  Hearing is normal   Motor:  Muscle bulk is normal.   Muscle tone and strength is normal in the arms and legs exept 4/5 left EHL and 4+ left ankle extesion   Positive  SLR on left with radiating pain  Sensory: She has intact sensation to touch and vibration in the arms and legs  Coordination: Cerebellar testing reveals good finger-nose-finger and heel-to-shin bilaterally.  Gait and station: Station is normal.    Gait shows mild left foot drop.  Poor tandem Romberg is negative.  .  Reflexes: Deep tendon reflexes are symmetric and normal bilaterally in arms and legs.    No clonus or spread.        DIAGNOSTIC DATA (LABS, IMAGING, TESTING) - I reviewed patient records, labs, notes, testing and imaging myself where available.  Lab Results  Component Value Date   WBC 4.9 02/22/2024   HGB 13.5 02/22/2024   HCT 41.3 02/22/2024   MCV 86 02/22/2024   PLT 213 02/22/2024      Component Value Date/Time   NA 135 02/22/2024 1411   K 3.5 02/22/2024 1411   CL 95 (L) 02/22/2024 1411   CO2 22 02/22/2024 1411   GLUCOSE 100 (H) 02/22/2024 1411   GLUCOSE 110 (H) 12/06/2019 1653   BUN 18 02/22/2024 1411   CREATININE 1.25 (H) 02/22/2024 1411   CALCIUM  9.0 02/22/2024 1411   PROT 6.9 02/22/2024 1411   ALBUMIN 4.0 02/22/2024 1411   AST 59 (H) 02/22/2024 1411   ALT 39 (H) 02/22/2024 1411   ALKPHOS 110 02/22/2024 1411   BILITOT 0.8 02/22/2024 1411   GFRNONAA 105 02/29/2020 0810   GFRAA 121 02/29/2020 0810       ASSESSMENT AND PLAN  Multiple sclerosis, relapsing-remitting  Lumbar radiculopathy - Plan: MR LUMBAR SPINE WO CONTRAST  Degeneration of intervertebral disc of lumbar region with discogenic back pain and lower extremity pain - Plan: MR LUMBAR SPINE WO CONTRAST  Dysesthesia  Gait disturbance  Vitamin D  deficiency  OSA on CPAP  High risk medication use   1.    Continue Aubagio .    LFTs were fine multiple times in  2024/2025 though mildly elevated when she had Infl A.   2.    Gabapentin  increase to 300-300-600 for dysesthesias likely associated with left L5 radiculopathy (pain inc after fall - will also check MRI lumbar as may need intervention or surgery as also has weakness) 3.  Continue  Myrbetriq  for bladder.      Continue vit D supplements.  3.    Continue to be active and exercise as tolerated. 4.    Continue CPAP.  Trial of modafinil  for OSA/EDS and MS fatigue.  If not better, consider a stimulant.   5.   She will return to see us  in 6 months or sooner if there are new or worsening neurologic symptoms.     Sandra Bonilla A. Vear, MD, PhD 03/28/2024, 4:50 PM Certified in Neurology, Clinical Neurophysiology, Sleep Medicine, Pain Medicine and Neuroimaging  [facility] [address] [place], [place] [address] [phone]  "

## 2024-03-29 ENCOUNTER — Other Ambulatory Visit: Payer: Self-pay | Admitting: Internal Medicine

## 2024-03-29 ENCOUNTER — Encounter: Payer: Self-pay | Admitting: Neurology

## 2024-03-29 ENCOUNTER — Ambulatory Visit: Admitting: Internal Medicine

## 2024-03-29 DIAGNOSIS — F418 Other specified anxiety disorders: Secondary | ICD-10-CM

## 2024-03-29 MED ORDER — GABAPENTIN 300 MG PO CAPS: ORAL_CAPSULE | ORAL | 3 refills | Status: AC

## 2024-03-29 NOTE — Telephone Encounter (Signed)
 Pharmacy Patient Advocate Encounter  Received notification from [employer] that Prior Authorization for Teriflunomide  has been APPROVED from 03/29/2024 to 09/27/2024   PA #/Case ID/Reference #: [id number]

## 2024-04-04 ENCOUNTER — Encounter: Payer: Self-pay | Admitting: Neurology

## 2024-04-04 ENCOUNTER — Ambulatory Visit: Admitting: Psychology

## 2024-04-04 DIAGNOSIS — F411 Generalized anxiety disorder: Secondary | ICD-10-CM

## 2024-04-04 NOTE — Progress Notes (Signed)
"    [facility] Counselor/Therapist Progress Note  Patient ID: Sandra Bonilla, MRN: [id number],    Date: 04/04/2024  Time Spent: 2:30pm-3:30pm  Pt is seen for an in person visit.  Treatment Type: Individual Therapy  Reported Symptoms: worry w/ health issues and for losing independence.    Mental Status Exam: Appearance:  Well Groomed     Behavior: Appropriate  Motor: Normal  Speech/Language:  Clear and Coherent and Normal Rate  Affect: Appropriate  Mood: anxious  Thought process: normal  Thought content:   WNL  Sensory/Perceptual disturbances:   WNL  Orientation: oriented to person, place, time/date, and situation  Attention: Good  Concentration: Good  Memory: WNL  Fund of knowledge:  Good  Insight:   Good  Judgment:  Good  Impulse Control: Good   Risk Assessment: Danger to Self:  No Self-injurious Behavior: No Danger to Others: No Duty to Warn:no Physical Aggression / Violence:No  Access to Firearms a concern: No  Gang Involvement:No   Subjective: counselor assessed pt current functioning per pt report.  Processed w/pt recent stressors w/ health. Discussed worries and assisted w/ identifying distortions and reframing. explored positive interactions with family and how consistent w/ her values.  Assisted pt w/ identifying boundaries for self.   Pt affect wnl.  Pt expressed worry w/ new health concerns and how impacting her mobility and worries for falls.  Pt discussed how she did fall recently and feels more hesitant to do thing son her own now.  Pt discussed thoughts of losing her independence.  Pt was able to recognize and reframe distortions.  Pt reports she has MRI and then f/u to discuss options to tx.  Pt reports that did have positive holiday and trip w/her friend.  Pt reports that feels good to have connection w/ her family.  Pt reports that work has continued to have stress.  Pt identified ways she is setting boundaries.      Interventions: Cognitive  Behavioral Therapy, Solution-Oriented/Positive Psychology, and supportive and self care  Diagnosis:Generalized anxiety disorder  Plan: Pt to f/u in 2-3 weeks w/ counseling.             BARBARANN APPL, [facility] "

## 2024-04-04 NOTE — Progress Notes (Signed)
  Individualized Treatment Plan Strengths: enjoys family dinners, her dog, travel w/ friend and dinners w/ friends.   Supports: her brother, her cousin   Goal/Needs for Treatment:  In order of importance to patient 1) manage anxiety 2) --- 3) ---   Client Statement of Needs: Pt: reducing the anxiety and the stress.  Figure out ways to reduce   Treatment Level:outpatient counseling  Symptoms:anxiety, ruminating worry, feeling on edge, depressed moods.   Client Treatment Preferences:biweekly in person counseling.  Pt to f/u w/ PCP for medication management.    Healthcare consumer's goal for treatment:  Counselor, Damien Herald, [facility] will support the patient's ability to achieve the goals identified. Cognitive Behavioral Therapy, Assertive Communication/Conflict Resolution Training, Relaxation Training, ACT, Humanistic and other evidenced-based practices will be used to promote progress towards healthy functioning.   Healthcare consumer will: Actively participate in therapy, working towards healthy functioning.    *Justification for Continuation/Discontinuation of Goal: R=Revised, O=Ongoing, A=Achieved, D=Discontinued  Goal 1) Increase awareness anxious thought patterns and ways to challenge, reframe and redirect AEB pt report and therapist observation.  Baseline date 01/21/24: Progress towards goal 0; How Often - Daily Target Date Goal Was reviewed Status Code Progress towards goal/Likert rating  01/20/25                Goal 2) Increased relaxation, mindfulness and desscalation skills to assist in managing anxiety AEB pt report and therapist observation.  Baseline date 01/21/24: Progress towards goal 0; How Often - Daily Target Date Goal Was reviewed Status Code Progress towards goal  01/20/25                 This plan has been reviewed and created by the following participants:  This plan will be reviewed at least every 12 months. Date Behavioral Health Clinician Date  Guardian/Patient   01/21/24  [facility] Herald [facility] 01/21/24 Verbal Consent Provided and requested electronic signature                     HERALD DAMIEN [facility]

## 2024-04-05 ENCOUNTER — Encounter: Payer: Self-pay | Admitting: Internal Medicine

## 2024-04-05 ENCOUNTER — Other Ambulatory Visit: Payer: Self-pay | Admitting: Neurology

## 2024-04-06 ENCOUNTER — Ambulatory Visit
Admission: RE | Admit: 2024-04-06 | Discharge: 2024-04-06 | Disposition: A | Discharge status: Home / Self Care | Source: Ambulatory Visit | Attending: Neurology | Admitting: Neurology

## 2024-04-06 ENCOUNTER — Telehealth: Payer: Self-pay

## 2024-04-06 ENCOUNTER — Other Ambulatory Visit (HOSPITAL_COMMUNITY): Payer: Self-pay

## 2024-04-06 DIAGNOSIS — M5416 Radiculopathy, lumbar region: Secondary | ICD-10-CM

## 2024-04-06 DIAGNOSIS — M51362 Other intervertebral disc degeneration, lumbar region with discogenic back pain and lower extremity pain: Secondary | ICD-10-CM

## 2024-04-06 NOTE — Telephone Encounter (Signed)
" ° ° ° °  Pharmacy Patient Advocate Encounter   Received notification from Fax that prior authorization for Teriflunomide  is required/requested.   Insurance verification completed.   The patient is insured through [employer].   Per test claim: PA required; PA submitted to above mentioned insurance via Fax Key/confirmation #/EOC [id number] Status is pending "

## 2024-04-07 ENCOUNTER — Ambulatory Visit: Payer: Self-pay | Admitting: Neurology

## 2024-04-08 ENCOUNTER — Other Ambulatory Visit: Payer: Self-pay | Admitting: Neurology

## 2024-04-08 DIAGNOSIS — M5416 Radiculopathy, lumbar region: Secondary | ICD-10-CM

## 2024-04-08 DIAGNOSIS — M48061 Spinal stenosis, lumbar region without neurogenic claudication: Secondary | ICD-10-CM

## 2024-04-08 NOTE — Telephone Encounter (Signed)
 Pharmacy Patient Advocate Encounter  Received notification from [employer] that Prior Authorization for Teriflunomide  has been APPROVED from 04/08/2024 to 09/27/2024   PA #/Case ID/Reference #: [id number]

## 2024-04-12 ENCOUNTER — Telehealth: Payer: Self-pay | Admitting: Neurology

## 2024-04-12 ENCOUNTER — Ambulatory Visit: Admitting: Neurology

## 2024-04-12 NOTE — Telephone Encounter (Signed)
Referral for neurosurgery fax to [facility]. Phone: [phone], Fax: [phone].

## 2024-04-18 NOTE — Telephone Encounter (Signed)
 [facility] calling us  Received call about MRI Lumbar results.  We do see in EPIC and Dr. Vear has already addressed, pt referred to neurosurgery.

## 2024-04-18 NOTE — Telephone Encounter (Signed)
 Office closed in observance of Gladis Vonn Novak Day. Will call back on Tuesday.

## 2024-04-19 ENCOUNTER — Other Ambulatory Visit: Payer: Self-pay | Admitting: Internal Medicine

## 2024-04-19 DIAGNOSIS — M545 Low back pain, unspecified: Secondary | ICD-10-CM

## 2024-04-19 NOTE — Telephone Encounter (Signed)
 Called [facility] . Spoke to Phone rep , Informed that Pt referral has been received , However [facility] Referral coordinator has to review it first is and then  give Pt a call.

## 2024-04-22 ENCOUNTER — Ambulatory Visit: Payer: Self-pay | Admitting: Internal Medicine

## 2024-04-22 VITALS — BP 116/78 | HR 96 | Temp 97.9°F | Ht 66.0 in | Wt 185.0 lb

## 2024-04-22 DIAGNOSIS — I10 Essential (primary) hypertension: Secondary | ICD-10-CM

## 2024-04-22 DIAGNOSIS — E538 Deficiency of other specified B group vitamins: Secondary | ICD-10-CM | POA: Diagnosis not present

## 2024-04-22 MED ORDER — ZEPBOUND 12.5 MG/0.5ML ~~LOC~~ SOAJ: 12.5000 mg | SUBCUTANEOUS | 1 refills | Status: AC

## 2024-04-22 NOTE — Progress Notes (Signed)
 "  Established Patient Office Visit  Subjective:  Patient ID: Sandra Bonilla, female    DOB: 02-06-1976  Age: 49 y.o. MRN: [id number]  Chief Complaint  Patient presents with   Follow-up    2 month weight management    No new complaints, here for weight management and medication refills. Lost 8 lbs with diet and compliant with GLP-1.     No other concerns at this time.   Past Medical History:  Diagnosis Date   Anemia 05/2019   iron  infusions   Anxiety    Asthma    Cervical high risk human papillomavirus (HPV) DNA test positive 09/2015   Cholecystitis 2006   GALBLADDER REMOVED   Depression    Diverticulitis    Diverticulosis    Dysplastic nevus 08/24/2008   right upper back lat, right upper back medial   Dysplastic nevus 04/21/2022   upper back medial to middlescapula, mild atypia   Fibroadenoma 2016   RIGHT BREAST   GERD (gastroesophageal reflux disease)    Head injury 06/25/2016   History of mammogram 11/2014   FIBROADENOMA   History of Papanicolaou smear of cervix 01/24/13; 10/15/15   -/-; -/+   Hypertension    Menorrhagia 02/01/2010   D/C HYSTEROSCOPY ENDO POLYP   MS (multiple sclerosis)    Neuromuscular disorder (HCC)    Ovarian cyst 9/15; 2015-2016   LEFT - WENT TO [facility]; RIGHT   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   Sleep apnea    WEAR C PAP    Past Surgical History:  Procedure Laterality Date   BIOPSY  03/21/2019   Procedure: BIOPSY;  Surgeon: Wilhelmenia Aloha Raddle., MD;  Location: [facility] ENDOSCOPY;  Service: Gastroenterology;;   BREAST BIOPSY Right 2016   Fibroadenoma   BREAST BIOPSY Right 03/15/2024   US  RT BREAST BX W LOC DEV 1ST LESION IMG BX SPEC US  GUIDE 03/15/2024 [facility]   CHOLECYSTECTOMY  2006   COLON RESECTION N/A 08/11/2019   Procedure: DIAGNOSTIC LAPAROSCOPY WITH WASH OUT, DRAIN PLACEMENT, AND LOOP ILEOSTOMY CREATION;  Surgeon: Debby Hila, MD;  Location: [facility] ORS;  Service: General;  Laterality: N/A;   COLONOSCOPY  WITH PROPOFOL  N/A 03/21/2019   Procedure: COLONOSCOPY WITH PROPOFOL ;  Surgeon: Wilhelmenia Aloha Raddle., MD;  Location: [facility] ENDOSCOPY;  Service: Gastroenterology;  Laterality: N/A;  ultraslim colonoscope    CYSTOSCOPY WITH INJECTION N/A 08/04/2019   Procedure: FIREFLY INJECTION;  Surgeon: Cam Morene ORN, MD;  Location: [facility] ORS;  Service: Urology;  Laterality: N/A;   DILATION AND CURETTAGE, DIAGNOSTIC / THERAPEUTIC  02/01/2010   D/C HYSTEROSCOPY ENDO POLYP; [facility]   ENDOSCOPIC MUCOSAL RESECTION  03/21/2019   Procedure: ENDOSCOPIC MUCOSAL RESECTION;  Surgeon: Wilhelmenia Aloha Raddle., MD;  Location: [facility] ENDOSCOPY;  Service: Gastroenterology;;   ESOPHAGOGASTRODUODENOSCOPY (EGD) WITH PROPOFOL  N/A 03/21/2019   Procedure: ESOPHAGOGASTRODUODENOSCOPY (EGD) WITH PROPOFOL ;  Surgeon: Wilhelmenia Aloha Raddle., MD;  Location: [facility] ENDOSCOPY;  Service: Gastroenterology;  Laterality: N/A;   EYE SURGERY     Lasik   FLEXIBLE SIGMOIDOSCOPY  07/31/2016   Procedure: FLEXIBLE SIGMOIDOSCOPY;  Surgeon: Debby Hila, MD;  Location: [facility] ENDOSCOPY;  Service: Endoscopy;;   HEMOSTASIS CLIP PLACEMENT  03/21/2019   Procedure: HEMOSTASIS CLIP PLACEMENT;  Surgeon: Wilhelmenia Aloha Raddle., MD;  Location: [facility] ENDOSCOPY;  Service: Gastroenterology;;   HYSTEROSCOPY  02/01/2010   ENDO POLYP - [facility]   IR RADIOLOGIST EVAL & MGMT  11/16/2018   IR RADIOLOGIST EVAL & MGMT  09/06/2019   SCLEROTHERAPY  03/21/2019   Procedure: SCLEROTHERAPY;  Surgeon:  Mansouraty, Aloha Raddle., MD;  Location: [facility] ENDOSCOPY;  Service: Gastroenterology;;    Social History   Socioeconomic History   Marital status: Divorced    Spouse name: Not on file   Number of children: 0   Years of education: 14   Highest education level: Not on file  Occupational History    Employer: [employer]  Tobacco Use   Smoking status: Former    Current packs/day: 0.00    Types: Cigarettes    Start date: 2000    Quit date: 2010    Years since quitting: 16.0   Smokeless  tobacco: Never  Vaping Use   Vaping status: Never Used  Substance and Sexual Activity   Alcohol  use: No   Drug use: No   Sexual activity: Yes    Partners: Male    Birth control/protection: I.U.D.    Comment: Mirena   Other Topics Concern   Not on file  Social History Narrative   Not on file   Social Drivers of Health   Tobacco Use: Medium Risk (03/28/2024)   Patient History    Smoking Tobacco Use: Former    Smokeless Tobacco Use: Never    Passive Exposure: Not on file  Financial Resource Strain: Low Risk  (05/19/2023)   Received from [facility]   Overall Financial Resource Strain (CARDIA)    Difficulty of Paying Living Expenses: Not hard at all  Food Insecurity: No Food Insecurity (05/19/2023)   Received [facility]   Epic    Within the past 12 months, you worried that your food would run out before you got the money to buy more.: Never true    Within the past 12 months, the food you bought just didn't last and you didn't have money to get more.: Never true  Transportation Needs: No Transportation Needs (05/19/2023)   Received from [facility] - Transportation    In the past 12 months, has lack of transportation kept you from medical appointments or from getting medications?: No    Lack of Transportation (Non-Medical): No  Physical Activity: Not on file  Stress: Not on file  Social Connections: Not on file  Intimate Partner Violence: Not on file  Depression (EYV7-0): Not on file  Alcohol  Screen: Not on file  Housing: Low Risk  (11/24/2023)   Received from [facility]   Epic    At any time in the past 12 months, were you homeless or living in a shelter (including now)?: No    In the last 12 months, was there a time when you were not able to pay the mortgage or rent on time?: No    In the past 12 months, how many times have you moved where you were living?: 0  Utilities: Not At Risk  (05/19/2023)   Received from [facility] Utilities    Threatened with loss of utilities: No  Health Literacy: Not on file    Family History  Problem Relation Age of Onset   Rheum arthritis Mother    Hypertension Mother    Thyroid  disease Mother        HYPOTHYROIDISM   Lung cancer Mother 67   Heart disease Father    Diabetes Father    Hypertension Father    Breast cancer Maternal Aunt 75       has contact   Cancer Paternal Grandmother        MELANOMA  OF SKIN   Cancer Cousin        KIDNEY-COUSIN   Colon cancer Neg Hx    Stomach cancer Neg Hx    Pancreatic cancer Neg Hx    Esophageal cancer Neg Hx    Inflammatory bowel disease Neg Hx    Rectal cancer Neg Hx    Colon polyps Neg Hx    Crohn'[place]o disease Neg Hx    Ulcerative colitis Neg Hx    Multiple sclerosis Neg Hx     Allergies[1]  Show/hide medication list[2]  Review of Systems  Constitutional:  Positive for weight loss (8 lbs). Negative for malaise/fatigue.  HENT: Negative.    Eyes: Negative.   Respiratory: Negative.    Cardiovascular: Negative.   Gastrointestinal: Negative.   Genitourinary: Negative.   Skin: Negative.   Neurological:  Positive for sensory change. Negative for focal weakness and weakness.  Endo/Heme/Allergies: Negative.   Psychiatric/Behavioral:  The patient does not have insomnia (daytime somnolence).        Objective:   BP 116/78   Pulse 96   Temp 97.9 F (36.6 C)   Ht 5' 6 (1.676 m)   Wt 185 lb (83.9 kg)   SpO2 97%   BMI 29.86 kg/m   Vitals:   04/22/24 1146  BP: 116/78  Pulse: 96  Temp: 97.9 F (36.6 C)  Height: 5' 6 (1.676 m)  Weight: 185 lb (83.9 kg)  SpO2: 97%  BMI (Calculated): 29.87    Physical Exam Vitals reviewed.  Constitutional:      General: She is not in acute distress.    Appearance: She is obese.  HENT:     Head: Normocephalic.     Nose: Nose normal.     Mouth/Throat:     Mouth: Mucous membranes are moist.  Eyes:      Extraocular Movements: Extraocular movements intact.     Pupils: Pupils are equal, round, and reactive to light.  Cardiovascular:     Rate and Rhythm: Normal rate and regular rhythm.     Heart sounds: No murmur heard. Pulmonary:     Effort: Pulmonary effort is normal.     Breath sounds: No rhonchi or rales.  Abdominal:     General: Abdomen is flat.     Palpations: There is no hepatomegaly, splenomegaly or mass.  Musculoskeletal:     Cervical back: Normal range of motion. No tenderness.     Lumbar back: No bony tenderness. Positive left straight leg raise test. Negative right straight leg raise test.  Skin:    General: Skin is warm and dry.  Neurological:     General: No focal deficit present.     Mental Status: She is alert and oriented to person, place, and time.     Cranial Nerves: No cranial nerve deficit.     Motor: No weakness.  Psychiatric:        Mood and Affect: Mood normal.        Behavior: Behavior normal.      No results found for any visits on 04/22/24.      Assessment & Plan:  Sandra Bonilla was seen today for follow-up.  Primary hypertension  Morbid obesity (HCC) -     Zepbound ; Inject 12.5 mg into the skin once a week.  Dispense: 2 mL; Refill: 1  Vitamin B 12 deficiency -     CBC With Diff/Platelet -     Vitamin B12    Problem List Items Addressed This Visit  Other   Morbid obesity (HCC)   Relevant Medications   tirzepatide  (ZEPBOUND ) 12.5 MG/0.5ML Pen   Other Visit Diagnoses       Primary hypertension    -  Primary     Vitamin B 12 deficiency       Relevant Orders   CBC With Diff/Platelet   Vitamin B12       Return in about 2 months (around 06/20/2024) for Weight management, fu with labs prior.   Total time spent: 20 minutes. This time includes review of previous notes and results and patient face to face interaction during today'Sandra Bonilla visit.    Sherrill Cinderella Perry, MD  04/22/2024   This document may have been prepared by  [facility] Voice Recognition software and as such may include unintentional dictation errors.     [1]  Allergies Allergen Reactions   Sulfamethoxazole-Trimethoprim Rash and Hives   Sulfa Antibiotics Other (See Comments) and Rash  [2]  Outpatient Medications Prior to Visit  Medication Sig   acetaminophen  (TYLENOL ) 325 MG tablet Take 2 tablets (650 mg total) by mouth every 6 (six) hours as needed for mild pain (or Fever >/= 101).   busPIRone  (BUSPAR ) 15 MG tablet TAKE 1 TABLET BY MOUTH TWICE  DAILY   chlorthalidone  (HYGROTON ) 25 MG tablet TAKE 1 TABLET BY MOUTH DAILY   cholecalciferol  (VITAMIN D3) 25 MCG (1000 UT) tablet Take 1,000 Units by mouth daily.   Cyanocobalamin  (B-12 COMPLIANCE INJECTION IJ) Inject as directed.   esomeprazole  (NEXIUM ) 40 MG capsule TAKE 1 CAPSULE(40 MG) BY MOUTH DAILY   FLUoxetine  (PROZAC ) 10 MG capsule Take 1 capsule (10 mg total) by mouth daily.   gabapentin  (NEURONTIN ) 300 MG capsule TAKE 1 CAPSULE BY MOUTH IN THE  MORNING , 1 CAPSULE IN THE  EVENING AND 2 CAPSULES AT  BEDTIME   levonorgestrel  (MIRENA ) 20 MCG/DAY IUD 1 each by Intrauterine route once.   lidocaine  (LIDODERM ) 5 % Place 1 patch onto the skin daily. Remove & Discard patch within 12 hours or as directed by MD   LORazepam  (ATIVAN ) 1 MG tablet TAKE 1 TABLET BY MOUTH DAILY AS  NEEDED FOR ANXIETY   mirabegron  ER (MYRBETRIQ ) 25 MG TB24 tablet Take 1 tablet (25 mg total) by mouth daily.   modafinil  (PROVIGIL ) 200 MG tablet TAKE 1 TABLET(200 MG) BY MOUTH DAILY   naproxen  (NAPROSYN ) 500 MG tablet Take 1 tablet (500 mg total) by mouth 2 (two) times daily as needed.   ondansetron  (ZOFRAN -ODT) 4 MG disintegrating tablet Take 1 tablet (4 mg total) by mouth every 6 (six) hours as needed for nausea or vomiting.   Polyvinyl Alcohol -Povidone (REFRESH OP) Place 2 drops into both eyes 4 (four) times daily as needed (dryness).   potassium chloride  SA (KLOR-CON  M) 20 MEQ tablet TAKE 1 TABLET(20 MEQ) BY MOUTH DAILY    Probiotic Product (PROBIOTIC PO) Take 1 capsule by mouth daily.   Teriflunomide  14 MG TABS Take 1 tablet (14 mg total) by mouth daily.   tiZANidine  (ZANAFLEX ) 4 MG tablet TAKE 1 TABLET(4 MG) BY MOUTH THREE TIMES DAILY FOR 20 DAYS   TRELEGY ELLIPTA 200-62.5-25 MCG/ACT AEPB Inhale 1 puff into the lungs daily.   [DISCONTINUED] methylPREDNISolone (MEDROL DOSEPAK) 4 MG TBPK tablet Use as directed (Patient not taking: Reported on 04/22/2024)   No facility-administered medications prior to visit.   "

## 2024-04-28 ENCOUNTER — Ambulatory Visit: Admitting: Psychology

## 2024-04-28 DIAGNOSIS — F411 Generalized anxiety disorder: Secondary | ICD-10-CM

## 2024-04-28 NOTE — Progress Notes (Signed)
  Individualized Treatment Plan Strengths: enjoys family dinners, her dog, travel w/ friend and dinners w/ friends.   Supports: her brother, her cousin   Goal/Needs for Treatment:  In order of importance to patient 1) manage anxiety 2) --- 3) ---   Client Statement of Needs: Pt: reducing the anxiety and the stress.  Figure out ways to reduce   Treatment Level:outpatient counseling  Symptoms:anxiety, ruminating worry, feeling on edge, depressed moods.   Client Treatment Preferences:biweekly in person counseling.  Pt to f/u w/ PCP for medication management.    Healthcare consumer's goal for treatment:  Counselor, Damien Herald, [facility] will support the patient's ability to achieve the goals identified. Cognitive Behavioral Therapy, Assertive Communication/Conflict Resolution Training, Relaxation Training, ACT, Humanistic and other evidenced-based practices will be used to promote progress towards healthy functioning.   Healthcare consumer will: Actively participate in therapy, working towards healthy functioning.    *Justification for Continuation/Discontinuation of Goal: R=Revised, O=Ongoing, A=Achieved, D=Discontinued  Goal 1) Increase awareness anxious thought patterns and ways to challenge, reframe and redirect AEB pt report and therapist observation.  Baseline date 01/21/24: Progress towards goal 0; How Often - Daily Target Date Goal Was reviewed Status Code Progress towards goal/Likert rating  01/20/25                Goal 2) Increased relaxation, mindfulness and desscalation skills to assist in managing anxiety AEB pt report and therapist observation.  Baseline date 01/21/24: Progress towards goal 0; How Often - Daily Target Date Goal Was reviewed Status Code Progress towards goal  01/20/25                 This plan has been reviewed and created by the following participants:  This plan will be reviewed at least every 12 months. Date Behavioral Health Clinician Date  Guardian/Patient   01/21/24  [facility] Herald [facility] 01/21/24 Verbal Consent Provided and requested electronic signature                     HERALD DAMIEN [facility]               [place]

## 2024-04-28 NOTE — Progress Notes (Signed)
 "  [facility] Counselor/Therapist Progress Note  Patient ID: Sandra Bonilla, MRN: [id number],    Date: 04/28/2024  Time Spent: 3:20pm-4:30pm  Pt is seen for an in person visit.  Treatment Type: Individual Therapy  Reported Symptoms: worry and feeling overwhelmed w/ stressors.    Mental Status Exam: Appearance:  Well Groomed     Behavior: Appropriate  Motor: Normal  Speech/Language:  Clear and Coherent and Normal Rate  Affect: Appropriate  Mood: anxious  Thought process: normal  Thought content:   WNL  Sensory/Perceptual disturbances:   WNL  Orientation: oriented to person, place, time/date, and situation  Attention: Good  Concentration: Good  Memory: WNL  Fund of knowledge:  Good  Insight:   Good  Judgment:  Good  Impulse Control: Good   Risk Assessment: Danger to Self:  No Self-injurious Behavior: No Danger to Others: No Duty to Warn:no Physical Aggression / Violence:No  Access to Firearms a concern: No  Gang Involvement:No   Subjective: counselor assessed pt current functioning per pt report.  Processed w/pt recent positives and stressors.  Validated and normalized worries re: healthy.  Assisted in recognizing ways to redirect when ruminating worry and focus on present focus/facts.  Discussed connection w supports and positive engagement.  Pt affect wnl.  Pt reports MRI shows bulging disc and spine bone on bone in place and has been referred to neurosurgeon at [facility].  Pt reports worry about and whether less invasive procedures to address issues.  Pt discussed awareness stressors at work and how impacts her anxiety.  Pt is focused on acknowledging, reframing and focus on what 's in her control.  Pt discussed continued positive support and importance of family dinners she keeps going.    Interventions: Cognitive Behavioral Therapy, Solution-Oriented/Positive Psychology, and supportive and self care  Diagnosis:Generalized anxiety disorder  Plan: Pt to f/u in  2-3 weeks w/ counseling.      Individualized Treatment Plan Strengths: enjoys family dinners, her dog, travel w/ friend and dinners w/ friends.   Supports: her brother, her cousin   Goal/Needs for Treatment:  In order of importance to patient 1) manage anxiety 2) --- 3) ---   Client Statement of Needs: Pt: reducing the anxiety and the stress.  Figure out ways to reduce   Treatment Level:outpatient counseling  Symptoms:anxiety, ruminating worry, feeling on edge, depressed moods.   Client Treatment Preferences:biweekly in person counseling.  Pt to f/u w/ PCP for medication management.    Healthcare consumer's goal for treatment:  Counselor, Damien Herald, [facility] will support the patient's ability to achieve the goals identified. Cognitive Behavioral Therapy, Assertive Communication/Conflict Resolution Training, Relaxation Training, ACT, Humanistic and other evidenced-based practices will be used to promote progress towards healthy functioning.   Healthcare consumer will: Actively participate in therapy, working towards healthy functioning.    *Justification for Continuation/Discontinuation of Goal: R=Revised, O=Ongoing, A=Achieved, D=Discontinued  Goal 1) Increase awareness anxious thought patterns and ways to challenge, reframe and redirect AEB pt report and therapist observation.  Baseline date 01/21/24: Progress towards goal 0; How Often - Daily Target Date Goal Was reviewed Status Code Progress towards goal/Likert rating  01/20/25                Goal 2) Increased relaxation, mindfulness and desscalation skills to assist in managing anxiety AEB pt report and therapist observation.  Baseline date 01/21/24: Progress towards goal 0; How Often - Daily Target Date Goal Was reviewed Status Code Progress towards goal  01/20/25  This plan has been reviewed and created by the following participants:  This plan will be reviewed at least every 12 months. Date  Behavioral Health Clinician Date Guardian/Patient   01/21/24  [facility] Barbarann [facility] 01/21/24 Verbal Consent Provided and requested electronic signature                 BARBARANN APPL [facility] "

## 2024-05-06 ENCOUNTER — Ambulatory Visit: Admitting: Internal Medicine

## 2024-05-06 DIAGNOSIS — E538 Deficiency of other specified B group vitamins: Secondary | ICD-10-CM

## 2024-05-06 MED ORDER — CYANOCOBALAMIN 1000 MCG/ML IJ SOLN
1000.0000 ug | Freq: Once | INTRAMUSCULAR | Status: AC
  Administered 2024-05-06: 1000 ug via INTRAMUSCULAR

## 2024-05-16 ENCOUNTER — Ambulatory Visit: Admitting: Psychology

## 2024-05-24 ENCOUNTER — Encounter: Payer: BC Managed Care – PPO | Admitting: Dermatology

## 2024-06-03 ENCOUNTER — Ambulatory Visit

## 2024-06-21 ENCOUNTER — Ambulatory Visit: Admitting: Internal Medicine

## 2024-07-01 ENCOUNTER — Ambulatory Visit

## 2024-07-29 ENCOUNTER — Ambulatory Visit

## 2024-09-02 ENCOUNTER — Ambulatory Visit

## 2024-09-29 ENCOUNTER — Ambulatory Visit

## 2024-11-04 ENCOUNTER — Ambulatory Visit

## 2024-11-21 ENCOUNTER — Ambulatory Visit: Admitting: Neurology

## 2024-12-02 ENCOUNTER — Ambulatory Visit

## 2024-12-30 ENCOUNTER — Ambulatory Visit

## 2025-02-03 ENCOUNTER — Ambulatory Visit

## 2025-03-03 ENCOUNTER — Ambulatory Visit

## 2025-03-30 ENCOUNTER — Ambulatory Visit
